# Patient Record
Sex: Female | Born: 1955 | Race: White | Hispanic: No | Smoking: Former smoker
Health system: Southern US, Community
[De-identification: ages and names within clinical notes are randomized; demographics above are authoritative.]

## PROBLEM LIST (undated history)

## (undated) DIAGNOSIS — J45909 Unspecified asthma, uncomplicated: Secondary | ICD-10-CM

## (undated) DIAGNOSIS — I471 Supraventricular tachycardia, unspecified: Secondary | ICD-10-CM

## (undated) DIAGNOSIS — R011 Cardiac murmur, unspecified: Secondary | ICD-10-CM

## (undated) DIAGNOSIS — J449 Chronic obstructive pulmonary disease, unspecified: Secondary | ICD-10-CM

## (undated) DIAGNOSIS — J441 Chronic obstructive pulmonary disease with (acute) exacerbation: Secondary | ICD-10-CM

## (undated) DIAGNOSIS — F329 Major depressive disorder, single episode, unspecified: Secondary | ICD-10-CM

## (undated) DIAGNOSIS — R51 Headache: Secondary | ICD-10-CM

## (undated) DIAGNOSIS — Z9289 Personal history of other medical treatment: Secondary | ICD-10-CM

## (undated) DIAGNOSIS — R748 Abnormal levels of other serum enzymes: Secondary | ICD-10-CM

## (undated) DIAGNOSIS — I10 Essential (primary) hypertension: Secondary | ICD-10-CM

## (undated) DIAGNOSIS — M961 Postlaminectomy syndrome, not elsewhere classified: Secondary | ICD-10-CM

## (undated) DIAGNOSIS — I2510 Atherosclerotic heart disease of native coronary artery without angina pectoris: Secondary | ICD-10-CM

## (undated) DIAGNOSIS — K529 Noninfective gastroenteritis and colitis, unspecified: Secondary | ICD-10-CM

## (undated) DIAGNOSIS — I209 Angina pectoris, unspecified: Secondary | ICD-10-CM

## (undated) DIAGNOSIS — M1990 Unspecified osteoarthritis, unspecified site: Secondary | ICD-10-CM

## (undated) DIAGNOSIS — R112 Nausea with vomiting, unspecified: Secondary | ICD-10-CM

## (undated) DIAGNOSIS — G8929 Other chronic pain: Secondary | ICD-10-CM

## (undated) DIAGNOSIS — R079 Chest pain, unspecified: Secondary | ICD-10-CM

## (undated) DIAGNOSIS — R351 Nocturia: Secondary | ICD-10-CM

## (undated) DIAGNOSIS — F32A Depression, unspecified: Secondary | ICD-10-CM

## (undated) DIAGNOSIS — M25561 Pain in right knee: Secondary | ICD-10-CM

## (undated) DIAGNOSIS — L7682 Other postprocedural complications of skin and subcutaneous tissue: Secondary | ICD-10-CM

## (undated) DIAGNOSIS — Z8719 Personal history of other diseases of the digestive system: Secondary | ICD-10-CM

## (undated) DIAGNOSIS — Z8669 Personal history of other diseases of the nervous system and sense organs: Secondary | ICD-10-CM

## (undated) DIAGNOSIS — E7800 Pure hypercholesterolemia, unspecified: Secondary | ICD-10-CM

## (undated) DIAGNOSIS — N179 Acute kidney failure, unspecified: Secondary | ICD-10-CM

## (undated) DIAGNOSIS — F4002 Agoraphobia without panic disorder: Secondary | ICD-10-CM

## (undated) DIAGNOSIS — K769 Liver disease, unspecified: Secondary | ICD-10-CM

## (undated) DIAGNOSIS — J42 Unspecified chronic bronchitis: Secondary | ICD-10-CM

## (undated) DIAGNOSIS — K219 Gastro-esophageal reflux disease without esophagitis: Secondary | ICD-10-CM

## (undated) DIAGNOSIS — M2392 Unspecified internal derangement of left knee: Secondary | ICD-10-CM

## (undated) DIAGNOSIS — R05 Cough: Secondary | ICD-10-CM

## (undated) DIAGNOSIS — M545 Low back pain, unspecified: Secondary | ICD-10-CM

## (undated) DIAGNOSIS — R0600 Dyspnea, unspecified: Secondary | ICD-10-CM

## (undated) DIAGNOSIS — M1712 Unilateral primary osteoarthritis, left knee: Secondary | ICD-10-CM

## (undated) DIAGNOSIS — I249 Acute ischemic heart disease, unspecified: Secondary | ICD-10-CM

## (undated) DIAGNOSIS — N3941 Urge incontinence: Secondary | ICD-10-CM

## (undated) DIAGNOSIS — J101 Influenza due to other identified influenza virus with other respiratory manifestations: Secondary | ICD-10-CM

## (undated) DIAGNOSIS — G4733 Obstructive sleep apnea (adult) (pediatric): Secondary | ICD-10-CM

## (undated) DIAGNOSIS — R058 Other specified cough: Secondary | ICD-10-CM

## (undated) DIAGNOSIS — D72829 Elevated white blood cell count, unspecified: Secondary | ICD-10-CM

## (undated) DIAGNOSIS — K589 Irritable bowel syndrome without diarrhea: Secondary | ICD-10-CM

## (undated) DIAGNOSIS — D509 Iron deficiency anemia, unspecified: Secondary | ICD-10-CM

## (undated) DIAGNOSIS — IMO0002 Reserved for concepts with insufficient information to code with codable children: Secondary | ICD-10-CM

## (undated) DIAGNOSIS — G4700 Insomnia, unspecified: Secondary | ICD-10-CM

## (undated) DIAGNOSIS — F419 Anxiety disorder, unspecified: Secondary | ICD-10-CM

## (undated) DIAGNOSIS — M25562 Pain in left knee: Secondary | ICD-10-CM

## (undated) DIAGNOSIS — G5602 Carpal tunnel syndrome, left upper limb: Secondary | ICD-10-CM

## (undated) DIAGNOSIS — M797 Fibromyalgia: Secondary | ICD-10-CM

## (undated) DIAGNOSIS — R200 Anesthesia of skin: Secondary | ICD-10-CM

## (undated) DIAGNOSIS — Z9889 Other specified postprocedural states: Secondary | ICD-10-CM

## (undated) DIAGNOSIS — E785 Hyperlipidemia, unspecified: Secondary | ICD-10-CM

## (undated) DIAGNOSIS — I200 Unstable angina: Secondary | ICD-10-CM

## (undated) DIAGNOSIS — Z720 Tobacco use: Secondary | ICD-10-CM

## (undated) DIAGNOSIS — R350 Frequency of micturition: Secondary | ICD-10-CM

## (undated) HISTORY — DX: Postlaminectomy syndrome, not elsewhere classified: M96.1

## (undated) HISTORY — PX: TUBAL LIGATION: SHX77

## (undated) HISTORY — DX: Supraventricular tachycardia, unspecified: I47.10

## (undated) HISTORY — DX: Acute ischemic heart disease, unspecified: I24.9

## (undated) HISTORY — DX: Hyperlipidemia, unspecified: E78.5

## (undated) HISTORY — DX: Low back pain, unspecified: M54.50

## (undated) HISTORY — PX: COLONOSCOPY: SHX174

## (undated) HISTORY — DX: Chest pain, unspecified: R07.9

## (undated) HISTORY — PX: JOINT REPLACEMENT: SHX530

## (undated) HISTORY — PX: UPPER GI ENDOSCOPY: SHX6162

## (undated) HISTORY — DX: Liver disease, unspecified: K76.9

## (undated) HISTORY — DX: Unilateral primary osteoarthritis, left knee: M17.12

## (undated) HISTORY — DX: Tobacco use: Z72.0

## (undated) HISTORY — DX: Supraventricular tachycardia: I47.1

## (undated) HISTORY — PX: EYE SURGERY: SHX253

## (undated) HISTORY — DX: Carpal tunnel syndrome, left upper limb: G56.02

## (undated) HISTORY — DX: Influenza due to other identified influenza virus with other respiratory manifestations: J10.1

## (undated) HISTORY — DX: Unstable angina: I20.0

## (undated) HISTORY — DX: Unspecified internal derangement of left knee: M23.92

## (undated) HISTORY — PX: CARPAL TUNNEL RELEASE: SHX101

## (undated) HISTORY — DX: Pain in right knee: M25.562

## (undated) HISTORY — PX: BACK SURGERY: SHX140

## (undated) HISTORY — PX: CATARACT EXTRACTION W/ INTRAOCULAR LENS  IMPLANT, BILATERAL: SHX1307

## (undated) HISTORY — DX: Chronic obstructive pulmonary disease with (acute) exacerbation: J44.1

## (undated) HISTORY — DX: Noninfective gastroenteritis and colitis, unspecified: K52.9

## (undated) HISTORY — DX: Pain in left knee: M25.561

## (undated) HISTORY — PX: WRIST SURGERY: SHX841

## (undated) HISTORY — DX: Acute kidney failure, unspecified: N17.9

---
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
## 1980-12-18 HISTORY — PX: CHOLECYSTECTOMY OPEN: SUR202

## 1980-12-18 HISTORY — PX: APPENDECTOMY: SHX54

## 1998-07-05 ENCOUNTER — Ambulatory Visit (HOSPITAL_COMMUNITY): Admission: RE | Admit: 1998-07-05 | Discharge: 1998-07-05 | Payer: Self-pay | Admitting: Gastroenterology

## 1998-08-30 ENCOUNTER — Ambulatory Visit (HOSPITAL_COMMUNITY): Admission: RE | Admit: 1998-08-30 | Discharge: 1998-08-30 | Payer: Self-pay | Admitting: Gastroenterology

## 1998-10-07 ENCOUNTER — Other Ambulatory Visit: Admission: RE | Admit: 1998-10-07 | Discharge: 1998-10-07 | Payer: Self-pay | Admitting: Family Medicine

## 1998-11-23 ENCOUNTER — Encounter: Payer: Self-pay | Admitting: Urology

## 1998-11-23 ENCOUNTER — Ambulatory Visit (HOSPITAL_COMMUNITY): Admission: RE | Admit: 1998-11-23 | Discharge: 1998-11-23 | Payer: Self-pay | Admitting: Urology

## 1999-02-11 ENCOUNTER — Ambulatory Visit (HOSPITAL_BASED_OUTPATIENT_CLINIC_OR_DEPARTMENT_OTHER): Admission: RE | Admit: 1999-02-11 | Discharge: 1999-02-11 | Payer: Self-pay | Admitting: Orthopedic Surgery

## 1999-10-20 ENCOUNTER — Encounter: Admission: RE | Admit: 1999-10-20 | Discharge: 1999-10-20 | Payer: Self-pay | Admitting: Family Medicine

## 1999-10-20 ENCOUNTER — Encounter: Payer: Self-pay | Admitting: Family Medicine

## 1999-10-27 ENCOUNTER — Other Ambulatory Visit: Admission: RE | Admit: 1999-10-27 | Discharge: 1999-10-27 | Payer: Self-pay | Admitting: Family Medicine

## 1999-12-19 HISTORY — PX: FINGER SURGERY: SHX640

## 2000-01-14 ENCOUNTER — Emergency Department (HOSPITAL_COMMUNITY): Admission: EM | Admit: 2000-01-14 | Discharge: 2000-01-14 | Payer: Self-pay | Admitting: Emergency Medicine

## 2000-05-09 ENCOUNTER — Ambulatory Visit (HOSPITAL_BASED_OUTPATIENT_CLINIC_OR_DEPARTMENT_OTHER): Admission: RE | Admit: 2000-05-09 | Discharge: 2000-05-10 | Payer: Self-pay | Admitting: Orthopedic Surgery

## 2000-12-18 HISTORY — PX: ANTERIOR CERVICAL DECOMP/DISCECTOMY FUSION: SHX1161

## 2001-03-22 ENCOUNTER — Ambulatory Visit (HOSPITAL_COMMUNITY): Admission: RE | Admit: 2001-03-22 | Discharge: 2001-03-22 | Payer: Self-pay | Admitting: Cardiovascular Disease

## 2001-03-22 ENCOUNTER — Encounter: Payer: Self-pay | Admitting: Cardiovascular Disease

## 2001-07-03 ENCOUNTER — Encounter: Admission: RE | Admit: 2001-07-03 | Discharge: 2001-07-03 | Payer: Self-pay | Admitting: Cardiovascular Disease

## 2001-07-03 ENCOUNTER — Encounter: Payer: Self-pay | Admitting: Cardiovascular Disease

## 2001-07-12 ENCOUNTER — Emergency Department (HOSPITAL_COMMUNITY): Admission: EM | Admit: 2001-07-12 | Discharge: 2001-07-12 | Payer: Self-pay | Admitting: Emergency Medicine

## 2001-07-12 ENCOUNTER — Encounter: Payer: Self-pay | Admitting: Emergency Medicine

## 2001-07-15 ENCOUNTER — Ambulatory Visit (HOSPITAL_COMMUNITY): Admission: RE | Admit: 2001-07-15 | Discharge: 2001-07-15 | Payer: Self-pay | Admitting: Cardiovascular Disease

## 2001-07-15 ENCOUNTER — Encounter: Payer: Self-pay | Admitting: Cardiovascular Disease

## 2001-09-13 ENCOUNTER — Encounter: Payer: Self-pay | Admitting: Neurosurgery

## 2001-09-17 ENCOUNTER — Inpatient Hospital Stay (HOSPITAL_COMMUNITY): Admission: RE | Admit: 2001-09-17 | Discharge: 2001-09-20 | Payer: Self-pay | Admitting: Neurosurgery

## 2001-09-17 ENCOUNTER — Encounter: Payer: Self-pay | Admitting: Neurosurgery

## 2001-10-02 ENCOUNTER — Encounter: Payer: Self-pay | Admitting: Neurosurgery

## 2001-10-02 ENCOUNTER — Encounter: Admission: RE | Admit: 2001-10-02 | Discharge: 2001-10-02 | Payer: Self-pay | Admitting: Neurosurgery

## 2001-10-17 ENCOUNTER — Other Ambulatory Visit: Admission: RE | Admit: 2001-10-17 | Discharge: 2001-10-17 | Payer: Self-pay | Admitting: Family Medicine

## 2001-12-17 ENCOUNTER — Encounter: Admission: RE | Admit: 2001-12-17 | Discharge: 2002-03-17 | Payer: Self-pay | Admitting: Neurosurgery

## 2001-12-18 HISTORY — PX: CARDIAC CATHETERIZATION: SHX172

## 2002-08-01 ENCOUNTER — Ambulatory Visit (HOSPITAL_COMMUNITY): Admission: RE | Admit: 2002-08-01 | Discharge: 2002-08-01 | Payer: Self-pay | Admitting: Cardiovascular Disease

## 2002-11-14 ENCOUNTER — Other Ambulatory Visit: Admission: RE | Admit: 2002-11-14 | Discharge: 2002-11-14 | Payer: Self-pay | Admitting: Obstetrics & Gynecology

## 2002-12-18 DIAGNOSIS — Z8711 Personal history of peptic ulcer disease: Secondary | ICD-10-CM

## 2002-12-18 DIAGNOSIS — D509 Iron deficiency anemia, unspecified: Secondary | ICD-10-CM

## 2002-12-18 HISTORY — DX: Personal history of peptic ulcer disease: Z87.11

## 2002-12-18 HISTORY — DX: Iron deficiency anemia, unspecified: D50.9

## 2003-07-29 ENCOUNTER — Ambulatory Visit (HOSPITAL_COMMUNITY): Admission: RE | Admit: 2003-07-29 | Discharge: 2003-07-29 | Payer: Self-pay | Admitting: Cardiovascular Disease

## 2003-08-04 ENCOUNTER — Emergency Department (HOSPITAL_COMMUNITY): Admission: EM | Admit: 2003-08-04 | Discharge: 2003-08-04 | Payer: Self-pay | Admitting: Emergency Medicine

## 2003-11-16 ENCOUNTER — Other Ambulatory Visit: Admission: RE | Admit: 2003-11-16 | Discharge: 2003-11-16 | Payer: Self-pay | Admitting: Obstetrics & Gynecology

## 2003-11-17 ENCOUNTER — Encounter: Admission: RE | Admit: 2003-11-17 | Discharge: 2003-11-17 | Payer: Self-pay | Admitting: Gastroenterology

## 2003-12-01 ENCOUNTER — Inpatient Hospital Stay (HOSPITAL_COMMUNITY): Admission: AD | Admit: 2003-12-01 | Discharge: 2003-12-11 | Payer: Self-pay | Admitting: Cardiovascular Disease

## 2003-12-02 ENCOUNTER — Encounter (INDEPENDENT_AMBULATORY_CARE_PROVIDER_SITE_OTHER): Payer: Self-pay | Admitting: Specialist

## 2003-12-07 ENCOUNTER — Encounter (INDEPENDENT_AMBULATORY_CARE_PROVIDER_SITE_OTHER): Payer: Self-pay | Admitting: *Deleted

## 2003-12-19 HISTORY — PX: HYSTEROSCOPY WITH D & C: SHX1775

## 2004-01-11 ENCOUNTER — Ambulatory Visit (HOSPITAL_COMMUNITY): Admission: RE | Admit: 2004-01-11 | Discharge: 2004-01-11 | Payer: Self-pay | Admitting: Gastroenterology

## 2004-04-14 ENCOUNTER — Encounter: Admission: RE | Admit: 2004-04-14 | Discharge: 2004-04-14 | Payer: Self-pay | Admitting: Cardiovascular Disease

## 2004-04-26 ENCOUNTER — Emergency Department (HOSPITAL_COMMUNITY): Admission: EM | Admit: 2004-04-26 | Discharge: 2004-04-27 | Payer: Self-pay | Admitting: *Deleted

## 2004-06-07 ENCOUNTER — Encounter: Admission: RE | Admit: 2004-06-07 | Discharge: 2004-06-07 | Payer: Self-pay | Admitting: Gastroenterology

## 2004-09-29 ENCOUNTER — Ambulatory Visit (HOSPITAL_COMMUNITY): Admission: RE | Admit: 2004-09-29 | Discharge: 2004-09-29 | Payer: Self-pay | Admitting: Obstetrics & Gynecology

## 2004-09-29 ENCOUNTER — Encounter (INDEPENDENT_AMBULATORY_CARE_PROVIDER_SITE_OTHER): Payer: Self-pay | Admitting: *Deleted

## 2004-10-05 ENCOUNTER — Encounter: Admission: RE | Admit: 2004-10-05 | Discharge: 2004-10-05 | Payer: Self-pay | Admitting: Cardiovascular Disease

## 2004-12-18 HISTORY — PX: OTHER SURGICAL HISTORY: SHX169

## 2005-02-13 ENCOUNTER — Encounter: Admission: RE | Admit: 2005-02-13 | Discharge: 2005-02-13 | Payer: Self-pay | Admitting: Neurosurgery

## 2005-05-27 ENCOUNTER — Emergency Department (HOSPITAL_COMMUNITY): Admission: EM | Admit: 2005-05-27 | Discharge: 2005-05-27 | Payer: Self-pay | Admitting: Emergency Medicine

## 2005-08-10 ENCOUNTER — Ambulatory Visit (HOSPITAL_COMMUNITY): Admission: RE | Admit: 2005-08-10 | Discharge: 2005-08-10 | Payer: Self-pay | Admitting: Urology

## 2005-08-10 ENCOUNTER — Encounter (INDEPENDENT_AMBULATORY_CARE_PROVIDER_SITE_OTHER): Payer: Self-pay | Admitting: Specialist

## 2005-08-10 ENCOUNTER — Ambulatory Visit (HOSPITAL_BASED_OUTPATIENT_CLINIC_OR_DEPARTMENT_OTHER): Admission: RE | Admit: 2005-08-10 | Discharge: 2005-08-10 | Payer: Self-pay | Admitting: Urology

## 2005-12-15 ENCOUNTER — Emergency Department (HOSPITAL_COMMUNITY): Admission: EM | Admit: 2005-12-15 | Discharge: 2005-12-15 | Payer: Self-pay | Admitting: Emergency Medicine

## 2005-12-18 HISTORY — PX: LUMBAR LAMINECTOMY: SHX95

## 2006-01-12 ENCOUNTER — Ambulatory Visit (HOSPITAL_COMMUNITY): Admission: RE | Admit: 2006-01-12 | Discharge: 2006-01-12 | Payer: Self-pay | Admitting: Cardiovascular Disease

## 2006-10-19 ENCOUNTER — Inpatient Hospital Stay (HOSPITAL_COMMUNITY): Admission: RE | Admit: 2006-10-19 | Discharge: 2006-10-21 | Payer: Self-pay | Admitting: Specialist

## 2006-12-18 HISTORY — PX: ANTERIOR CERVICAL DECOMP/DISCECTOMY FUSION: SHX1161

## 2007-01-28 ENCOUNTER — Ambulatory Visit (HOSPITAL_COMMUNITY): Admission: RE | Admit: 2007-01-28 | Discharge: 2007-01-28 | Payer: Self-pay | Admitting: Cardiovascular Disease

## 2007-07-15 ENCOUNTER — Encounter: Admission: RE | Admit: 2007-07-15 | Discharge: 2007-07-15 | Payer: Self-pay | Admitting: Neurosurgery

## 2007-07-26 ENCOUNTER — Inpatient Hospital Stay (HOSPITAL_COMMUNITY): Admission: RE | Admit: 2007-07-26 | Discharge: 2007-07-31 | Payer: Self-pay | Admitting: Neurosurgery

## 2007-12-23 ENCOUNTER — Emergency Department (HOSPITAL_COMMUNITY): Admission: EM | Admit: 2007-12-23 | Discharge: 2007-12-23 | Payer: Self-pay | Admitting: Emergency Medicine

## 2008-07-15 ENCOUNTER — Encounter: Admission: RE | Admit: 2008-07-15 | Discharge: 2008-07-15 | Payer: Self-pay | Admitting: Cardiovascular Disease

## 2008-07-23 ENCOUNTER — Ambulatory Visit (HOSPITAL_COMMUNITY): Admission: RE | Admit: 2008-07-23 | Discharge: 2008-07-23 | Payer: Self-pay | Admitting: Cardiovascular Disease

## 2008-09-17 ENCOUNTER — Emergency Department (HOSPITAL_COMMUNITY): Admission: EM | Admit: 2008-09-17 | Discharge: 2008-09-18 | Payer: Self-pay | Admitting: Emergency Medicine

## 2010-02-01 ENCOUNTER — Encounter: Admission: RE | Admit: 2010-02-01 | Discharge: 2010-05-02 | Payer: Self-pay | Admitting: Family Medicine

## 2010-04-20 ENCOUNTER — Encounter: Admission: RE | Admit: 2010-04-20 | Discharge: 2010-04-20 | Payer: Self-pay | Admitting: Family Medicine

## 2011-03-30 ENCOUNTER — Emergency Department (HOSPITAL_COMMUNITY): Payer: Medicare Other

## 2011-03-30 ENCOUNTER — Emergency Department (HOSPITAL_COMMUNITY)
Admission: EM | Admit: 2011-03-30 | Discharge: 2011-03-31 | Disposition: A | Discharge status: Home / Self Care | Payer: Medicare Other | Attending: Emergency Medicine | Admitting: Emergency Medicine

## 2011-03-30 DIAGNOSIS — S52539A Colles' fracture of unspecified radius, initial encounter for closed fracture: Secondary | ICD-10-CM | POA: Insufficient documentation

## 2011-03-30 DIAGNOSIS — I2510 Atherosclerotic heart disease of native coronary artery without angina pectoris: Secondary | ICD-10-CM | POA: Insufficient documentation

## 2011-03-30 DIAGNOSIS — M25539 Pain in unspecified wrist: Secondary | ICD-10-CM | POA: Insufficient documentation

## 2011-03-30 DIAGNOSIS — M542 Cervicalgia: Secondary | ICD-10-CM | POA: Insufficient documentation

## 2011-03-30 DIAGNOSIS — W1789XA Other fall from one level to another, initial encounter: Secondary | ICD-10-CM | POA: Insufficient documentation

## 2011-03-30 DIAGNOSIS — Y9289 Other specified places as the place of occurrence of the external cause: Secondary | ICD-10-CM | POA: Insufficient documentation

## 2011-03-30 DIAGNOSIS — I10 Essential (primary) hypertension: Secondary | ICD-10-CM | POA: Insufficient documentation

## 2011-03-30 DIAGNOSIS — S0990XA Unspecified injury of head, initial encounter: Secondary | ICD-10-CM | POA: Insufficient documentation

## 2011-03-30 DIAGNOSIS — J45909 Unspecified asthma, uncomplicated: Secondary | ICD-10-CM | POA: Insufficient documentation

## 2011-03-30 DIAGNOSIS — E785 Hyperlipidemia, unspecified: Secondary | ICD-10-CM | POA: Insufficient documentation

## 2011-05-02 NOTE — Discharge Summary (Signed)
[name], [name]                 ACCOUNT NO.:  [id number]   MEDICAL RECORD NO.:  [id number]          PATIENT TYPE:  INP   LOCATION:  3038                         FACILITY:  [facility]   PHYSICIAN:  Hilda Lias, M.D.   DATE OF BIRTH:  Nov 05, 1956   DATE OF ADMISSION:  07/26/2007  DATE OF DISCHARGE:  07/31/2007                               DISCHARGE SUMMARY   ADMISSION DIAGNOSES:  1. C4-5, C6-7 pseudoarthrosis.  2. Herniated disc C7-T1.   FINAL DIAGNOSES:  1. C4-5, C6-7 pseudoarthrosis.  2. Herniated disc C7-T1.   CLINICAL HISTORY:  The patient was admitted because of problem with neck  pain with new onset of radiation of pain to the right upper extremity.  Previously, this lady had a T11 fusion.  X-ray shows she has a false  joint at the level of 4-5 and 6-7 with a new herniated disc at the level  of C7-T1 with a normal fusion 5-6.  Because of the findings, she was  admitted to the hospital.   LABORATORY:  Normal.   COURSE IN THE HOSPITAL:  The patient was taken to surgery and a redo of  fusion at the level of 4-5 and 6-7 with a new fusion at the level of C7-  T1 was done.  We used bone from the hip.  Today, she is doing much  better.  The x-ray shows good alignment.  She has minimal pain between  the shoulder blade and no weakness.  Swallowing is normal.  She is going  to be discharged today to follow by me in my office.   CONDITION ON DISCHARGE:  Improvement.   MEDICATIONS:  Percocet, Diazepam.   DIET:  Regular.   ACTIVITY:  Not to drive for at least a week.   Follow up four weeks from now or as needed before.           ______________________________  Hilda Lias, M.D.     EB/MEDQ  D:  07/31/2007  T:  07/31/2007  Job:  [id number]

## 2011-05-02 NOTE — Op Note (Signed)
[name], [name]                 ACCOUNT NO.:  [id number]   MEDICAL RECORD NO.:  [id number]          PATIENT TYPE:  INP   LOCATION:  3038                         FACILITY:  [facility]   PHYSICIAN:  Hilda Lias, M.D.   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   PROCEDURES:  1. Exploration of the fusion.  2. Removal of the plate from C4 to C7.  3. Redo the cervical arthrosis 4-5 and 6-7.  4. Decompression of the C7-T1.  5. Fusion with autograft C4-5, C6-7, and C7-T1.  6. Two plates, one from C4 to C5 and the other one from C6 to T1.  7. Autograft from the left hip bone through a separate incision.  8. Microscope.   SURGEON:  Hilda Lias, M.D.   ASSISTANT:  Coletta Memos, M.D.   CLINICAL HISTORY:  [name] is a lady who is being admitted because of  cervical arthrosis at the level of 4-5 and 6-7 with a new herniated disk  at the level of C7-T1 to the right.  The risks were explained to her  during my physical examination and on several occasions she came to my  office.   PROCEDURE IN DETAIL:  The patient was taken to the OR and after  intubation, the left hip area and the left neck was prepped with  DuraPrep.  Drapes were applied.  A dermal incision from the previous one  was made in the skin through the subcutaneous tissue down to the  platysma.  The patient has quite a bit of adhesions where lysis was  achieved.  Immediately with the finger, we were able to palpate the  superior and inferior screws, which were in the soft tissue.  With  microdissection, we were able to remove both of the screws.  Reduction  of the trachea and the esophagus was done and the rest of the  six screws  from the plate were removed.  The patient with overhanging of the C4  vertebral body, which was drilled away.  After that, we removed the  plate.  We did exploration at the level of 4-5 and after, we took an x-  ray.  Indeed, the patient had fibrosis and removal of the fibrosis was  achieved with a foraminotomy.  The same procedure was done at the level  of C6 and C7 when we found essentially the same fibrosis with no  evidence of any bone whatsoever.  The area between 5-6 was completely  solid.  From then on, an anterior osteophyte at the level of C7-T1 was  removed and we entered the disk space where after we opened the  posterior ligament, we found four light fragments of herniated disk  going to the right side compromising the right C8 nerve root.  Decompression was achieved as well as decompression of the left C8 nerve  root.  Then, through a separate incision, we made an incision in the  left hip area through the skin and subcutaneous tissue down to the hip  area.  The fat here and muscle was there disattached from the ileal  crest.  Using the osteotome, we were able to remove three pieces of bone  graft of 10 mm.  The bone graft was tailored to the three spaces and  were inspected at the level of 4-5, 6-7 and C7-T1.  From then on, a  small plate from C4 to C5 using four screws was done, this was a  [place].  In the lower area, we did a plate using six screws from C6  down to T1.  Lateral cervical spine showed good position of the graft  from the plate at the level of 4-5 and the upper part of C6 was normal.  From then on, the area was irrigated.  We went ahead and introduced an  esophageal tube.  Note anesthetist introduced a mix of methylene blue  and saline to see if there is any evidence of any leak or opening in the  esophagus.  It was negative.  From then on, the Jackson-Pratt drain was  left in the precervical area and the wound was closed with Vicryl and   Steri-Strip.  In the hip area after we achieved good hemostasis, this  separate incision was closed with 0 Vicryl, 2-0 Vicryl and a Steri-  Strip.  The patient did well.           ______________________________  Hilda Lias, M.D.     EB/MEDQ  D:  07/26/2007  T:  07/27/2007  Job:  [id number]

## 2011-05-02 NOTE — H&P (Signed)
NAMEOMNI, DUNSWORTH                 ACCOUNT NO.:  [id number]   MEDICAL RECORD NO.:  [id number]          PATIENT TYPE:  INP   LOCATION:  3038                         FACILITY:  [facility]   PHYSICIAN:  Hilda Lias, M.D.   DATE OF BIRTH:  1956-04-11   DATE OF ADMISSION:  07/26/2007  DATE OF DISCHARGE:                              HISTORY & PHYSICAL   Ms. Clippard is a lady who in the past underwent fusion of the level 4-5,  5-6, and 6-7.  She was followed by me, and she developed pseudoarthrosis  of the left 4-5 and 6-7.  Nevertheless, she continued to complain of  some neck pain but nothing really special.  One of the x-rays showed  that one of the screws was loose.  I talked to her about surgery but she  declined.  She came to my office several weeks ago complaining of  worsening pain now into the right upper extremity associated with  weakness of the triceps.  I look at the x-ray.  We did a myelogram, and  it showed not only two loose screws and pseudoarthrosis but a large  herniated disk with spondylosis at the level of C7-T1 to the right side.  Because of that, we decided to proceed with surgery.  The patient fully  agreed, including knowing that she is heavy smoker and we are going to  be using her own hip bone.   PAST MEDICAL HISTORY:  1. Tonsillectomy.  2. Cholecystectomy.  3. Tubal ligation.   SOCIAL HISTORY:  She does smoke tobacco.  She drinks socially.   FAMILY HISTORY:  Unremarkable.   REVIEW OF SYSTEMS:  Positive for bronchitis, high blood pressure, sinus  headache, neck pain, and anxiety.   PHYSICAL EXAMINATION:  HEENT:  Normal.  NECK:  There is a scar anteriorly.  She has decreased flexibility.  LUNGS:  There is some mild rhonchi bilaterally.  CARDIOVASCULAR:  Normal.  EXTREMITIES:  Normal.  NEUROLOGIC:  Mental status normal.  Cranial nerves normal.  Strength:  She has weakness of the right triceps.  She also has changes of the  pectoralis major.  She has absent  right triceps reflex.   The x-ray and the myelogram showed that she has a herniated disk at the  level of  C7-T1 to the right and pseudoarthrosis at the level of 4-5and  6-7.   CLINICAL IMPRESSION:  Pseudoarthrosis of 4-5 and 6-7 with a new  herniated disk at the level of C7-T1.   PROCEDURE:  The patient is going to be admitted for surgery.  We are  going to remove the old plate, redo 4-5 and 6-7 and proceed with  decompression and fusion at the level of C7-T1.  We are going to be use  her on hip bone, and she knows about the risks of infection, CSF leak,  damage to the vocal cord, damage to the esophagus, need for further  surgery.           ______________________________  Hilda Lias, M.D.     EB/MEDQ  D:  07/26/2007  T:  07/28/2007  Job:  [id number]

## 2011-05-05 NOTE — Op Note (Signed)
[place]  Patient:    [name], [name] Visit Number: [id number] MRN: [id number]          Service Type: SUR Location: 3000 [id number] [id number] Attending Physician:  Danella Penton Dictated by:   Tanya Nones. Jeral Fruit, M.D. Proc. 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  POSTOPERATIVE DIAGNOSIS:  Cervical spondylosis at C4-5, C5-6, C6-7, with a chronic cervical radiculopathy.  PROCEDURE:  Anterior C4-5, C5-6, C6-7 diskectomy, decompression of the spinal cord, bilateral foraminotomy, microscope.  SURGEON:  Tanya Nones. Jeral Fruit, M.D.  ASSISTANT:  Hewitt Shorts, M.D.  CLINICAL HISTORY:  The patient was admitted because of neck pain with radiation to both upper extremities.  The x-ray showed spondylosis at three levels.  Surgery was advised, and the risks were explained in the history and physical.  DESCRIPTION OF PROCEDURE:  The patient was taken to the OR, and the left side of the neck was prepped with Betadine.  No traction was used.  An incision on the left side through the skin and platysma was carried out until we found the cervical spine.  We took an x-ray, which showed that indeed we were at the level of 5-6.  From then on, we went up to 4-5 and we did total diskectomy with decompression of the spinal cord and the foramen.  At the level of 5-6, we found worsening of the foraminal stenosis up to the point that in the left side, the vertebral artery was almost in the one-third lateral aspect of the disk space.  Decompression of both nerve roots was achieved and preservation of the artery was done.  Then at the level of C6-7, the same procedure was done.  Then we drilled the end plates at those three levels, and we introduced a bone graft of 7 mm in all three levels.  This was followed with a  plate using Tang screws.  Lateral C-spine showed good position of the grafts and the plate.  The patient, since he had been taking aspirin for quite a long time, has quite a bit of bleeding and because of this, to prevent any postoperative hematoma, we left a Jackson-Pratt drain.  Having good hemostasis and the area was irrigated, investigation of the esophagus, carotid, and trachea was normal.  From then on the wound was closed with Vicryl and Steri-Strip. Dictated by:   Tanya Nones. Jeral Fruit, M.D. Attending Physician:  Danella Penton DD:  09/17/01 TD:  09/17/01 Job: [phone]

## 2011-05-05 NOTE — Cardiovascular Report (Signed)
   NAME:  Brenda, Meadows                           ACCOUNT NO.:  [id number]   MEDICAL RECORD NO.:  [id number]                   PATIENT TYPE:  OIB   LOCATION:  2899                                 FACILITY:  [facility]   PHYSICIAN:  Ricki Rodriguez, M.D.               DATE OF BIRTH:  March 02, 1956   DATE OF PROCEDURE:  08/01/2002  DATE OF DISCHARGE:                              CARDIAC CATHETERIZATION   PROCEDURE:  Left heart catheterization, selective coronary angiography and  left ventricular function study.   INDICATION:  This 55 year old white female with multiple cardiac risk  factors had recurrent chest pain with abnormal stress test.   APPROACH:  Right femoral artery using 6 French diagnostic catheters.   COMPLICATIONS:  None.   Perclose suture applied.  However, additional 10 minute of local pressure  was needed for hemostasis. The patient required Romazicon to reverse the  sedation.   CORONARY ANATOMY:  Left main coronary artery was very short.   Left anterior descending coronary artery had significant calcification and a  proximal diffuse narrowing of 20-30%, mid vessel eccentric 30-40% narrowing.  Diagonal #1 vessel was a very small vessel and diagonal #2 also was small  vessel.   Left circumflex coronary artery had luminal irregularities.  Obtuse marginal  branch #1 had ostial 20% lesion. Obtuse marginal branch #2 had eccentric 80%  stenosis. However, the vessel was less than 1.5 mm in diameter.   Right coronary artery had a mid vessel 30% stenosis and the posterior  descending coronary artery was unremarkable.   LEFT VENTRICULOGRAM:  The left ventriculogram showed basilar inferior and  apical inferior wall hypokinesia with ejection fraction of 50-55%.   IMPRESSION:  1. Mild multivessel coronary artery disease.  2. Severe obtuse marginal branch #2 disease.  3. Preserved left ventricular systolic function.   RECOMMENDATIONS:  This patient will be treated medically with  aspirin,  Plavix, nitroglycerin, and lifestyle modifications.                                               Ricki Rodriguez, M.D.    ASK/MEDQ  D:  08/01/2002  T:  08/04/2002  Job:  [phone] [phone]

## 2011-05-05 NOTE — Discharge Summary (Signed)
. [facility]  Patient:    [name], [name] Visit Number: [id number] MRN: [id number]          Service Type: SUR Location: 3000 [id number] [id number] Attending Physician:  Danella Penton Dictated by:   Tanya Nones. Jeral Fruit, M.D. Admit Date:  09/17/2001 Discharge Date: 09/20/2001                             Discharge Summary  ADMITTING DIAGNOSIS:  Cervical vertebrae-4/5, cervical vertebrae-5/6, cervical vertebrae-6/7 spondylosis with radiculopathy.  FINAL DIAGNOSIS:  Cervical vertebrae-4/5, cervical vertebrae-5/6, cervical vertebrae-6/7 spondylosis with radiculopathy.  HISTORY OF PRESENT ILLNESS:  The patient was admitted because of neck pain related to the upper extremities.  X-rays showed spondylosis at three levels. Surgery was advised.  LABORATORY DATA:  Normal.  HOSPITAL COURSE:  The patient was taken to surgery.  Had a three level anterior cervical discectomy followed by fusion was done.  The patient doing much better today.  He is able to swallow.  He is ready to go home.  CONDITION ON DISCHARGE:  Improved.  DISCHARGE MEDICATIONS:  Percocet and diazepam.  DIET:  Regular.  ACTIVITY:  Not to lift for at least 10 days.  FOLLOW-UP:  To be seen by me in three weeks. Dictated by:   Tanya Nones. Jeral Fruit, M.D. Attending Physician:  Danella Penton DD:  09/20/01 TD:  09/20/01 Job: [id number]

## 2011-05-05 NOTE — Op Note (Signed)
[place]. [facility]  Patient:    [name], [name]                        MRN: [id number] Proc. Date: 05/09/00 Adm. Date:  [id number] Disc. Date: [id number] Attending:  Marlowe [place] CC:         Artist Pais Mina Marble, M.D. (2)                           Operative Report  PREOPERATIVE DIAGNOSIS:   Right thumb carpometacarpal arthritis.  POSTOPERATIVE DIAGNOSIS:  Right thumb CMC arthritis.  PROCEDURE:  Right thumb carpometacarpal suspension plasty with extensor indicis  proprius free tendon graft as well as EPL tendon transfer from the wrist .  SURGEON:  Artist Pais. Mina Marble, M.D.  ASSISTANT:  Nicki Reaper, M.D.  ANESTHESIA:  Axillary block with general supplemental  TOURNIQUET TIME:  1 hour, 20 minutes.  COMPLICATIONS/DRAINS:  None.  DESCRIPTION OF PROCEDURE:  The patient was taken to the operating room after the induction of adequate axillary block analgesia and general anesthesia.  The right upper extremity was prepped and draped in the usual sterile fashion.  Esmarch was used to exsanguinate the limb, and tourniquet was inflated to 250 mmHg. At this  point in time, a J shaped incision was made over the metacarpal base of the thumb and the J part was extended to the volar surface over the FCR tendon insertion.  Incision was taken down to the skin and subcutaneous tissues, with care to carefully identify retracted cutaneous nerves and veins. Once this was done, the joint capsule overlying the [facility] joint was split longitudinally, and the [facility] joint was identified.  There was significant hypertrophic synovium, hypertrophic capsular tissue involved. The trapezium was then excised using a combination of rongeurs, curets and then a synovectomy was performed of the [facility] joint.  Next, using a bone awl, a trans-osseous tunnel was made at the base of the thumb metacarpal and enlarged with subchondral drilling by hand, followed by formation of  a trans-osseous tunnel at the base of the next metacarpal.  Once the hand was fully supinated, a small incision was made over the palmar aspect of the hand and dissection was carried down to the volar wrist crease.  There was no palmaris longus tendon evident. The hand was then further pronated, and the EIP tendon was harvested in the usual fashion through 3 incisions.  The EIP was then placed in a sterile saline-soaked gauze.  At this point in time, ______ were driven through the trans-osseous tunnel on the index finger, and two Tycron sutures were placed with a loop on the volar side remaining. The free EIP tendon graft was then pulled up nto one of the two Ethibond suture loops and tightly secured over the dorsal aspect of the index metacarpal through a separate stab incision. The other end of the EIP  tendon was then looped through the metacarpal x 2 and sewn to itself, thus creating one suspension.  At this point, attention was paid to the dorsal radial aspect of the wrist distally, where a small incision was made over the dorsal compartment and a slip of the APL tendon was harvested in the usual fashion.  The APL tendon slip was then transferred into the [facility] joint. One end was pulled into the base of the next metacarpal through the trans-osseous tunnel and tied securely over the dorsal aspect of  the index finger, and end was looped through the metacarpal, through he trans-osseous tunnel, securely over the dorsal aspect of the index finger and end was looped through the previously placed EIP tendon free graft. These were all sutured securely with the thumb in full adduction against the palm, and longitudinal traction; thus creating the suspension.  These were all sutured using 3-0 Ethibond and figure-of-eight sutures.  The wound was irrigated thoroughly. he capsule was closed with 3-0 Ethibond and skin was closed with running 3-0 Prolene subcuticular stitches.  Steri-Strips, 4 x 4s, Fluffs and a radial gauze splint was applied. The patient tolerated the procedure well and went to the recovery room in stable fashion. DD:  05/09/00 TD:  05/13/00 Job: [id number]

## 2011-05-05 NOTE — Discharge Summary (Signed)
NAME:  [name], [name]                           ACCOUNT NO.:  [id number]   MEDICAL RECORD NO.:  [id number]                   PATIENT TYPE:  INP   LOCATION:  5708                                 FACILITY:  [facility]   PHYSICIAN:  Mohan N. Sharyn Lull, M.D.              DATE OF BIRTH:  [birth year]   DATE OF ADMISSION:  12/01/2003  DATE OF DISCHARGE:  12/11/2003                                 DISCHARGE SUMMARY   ADMITTING DIAGNOSES:  1. Weakness.  2. Dizziness.  3. Abdominal pain.  4. Peptic ulcer disease.  5. Hypochromic microcytic anemia; rule out gastrointestinal bleeding.  6. Hypertension.  7. Hyperlipidemia.  8. Dehydration.   FINAL DIAGNOSES:  1. Probable Crohn's disease.  2. Status post upper gastrointestinal bleeding probably secondary to pyloric     channel ulcer, resolving.  3. Resolving antibiotic-associated enterocolitis.  4. Anemia.  5. Chronic obstructive pulmonary disease.  6. Hypertension.  7. Depression.  8. Hyperlipidemia.   DISCHARGE HOME MEDICATIONS:  1. Norvasc 5 mg 1 tablet daily.  2. Clonidine 0.1 mg 1 tablet daily.  3. Singulair 10 mg 1 tablet daily.  4. Wellbutrin SR 150 mg 1 tablet daily.  5. Flagyl 250 mg 1 tab 3 times daily for 10 days.  6. Pentasa 250 mg 4 capsules 3 times daily.  7. Nexium 40 mg 1 capsule daily 1/2 hour before breakfast.  8. Ambien 10 mg 1 tablet daily at night as needed.   ACTIVITY:  As tolerated.   DIET:  Low salt, low cholesterol.   FOLLOW UP:  CBC in one week. Follow up with Dr. Algie Coffer in one week and Dr.  [name] in two weeks.   CONDITION ON DISCHARGE:  Stable.   BRIEF HISTORY:  Ms. Karras is a 55 year old white female who was admitted by  Dr. Algie Coffer on December 14 with complaint of weakness, dizziness, muscle  aches, and abdominal pain.  She states that she has lost approximately 10  pounds in the last two months.   PAST MEDICAL HISTORY:  1. History of hypertension.  2. Anemia.  3. History of smoking one to two packs  per day for 28 years.  No history of     alcohol or drug abuse.  4. History of hypercholesterolemia.  5. History of mild obesity.  6. Positive family history of coronary artery disease.   PAST SURGICAL HISTORY:  1. Tonsillectomy in 1955.  2. Tubal ligation in 1983.  3. Cholecystectomy in 1982.  4. Appendectomy in 1982.  5. Right thyroid surgery in 2000.  6. Hiatus hernia.  7. Bladder suspension surgery in 1998.  8. Catheterization in 2003.   MEDICATIONS AT HOME:  1. Valium 5 mg p.o. daily.  2. Hydrochlorothiazide 25 mg p.o. daily.  3. KCl 10 mEq t.i.d.  4. Baby aspirin 81 mg p.o. daily.  5. Norvasc 5 mg p.o. daily.  6. Lopid 600 mg p.o. b.i.d.  7. Clonidine 0.1 mg p.o. daily.  8. Nexium 40 mg p.o. b.i.d.  9. Singulair 10 mg p.o. daily.  10.      Combivent inhaler 2 puffs b.i.d.  11.      Wellbutrin SR 150 b.i.d.   ALLERGIES:  None.   SOCIAL HISTORY:  She is married for 23 years.  One daughter.  She works at  [employer].   FAMILY HISTORY:  Father at 60 had MI.  Mother is alive at 31.  She is  hypertensive.  One brother has hypertension.  Two sisters have hypertension.   PHYSICAL EXAMINATION:  VITAL SIGNS:  Blood pressure 110/70, pulse  84 and  regular.  HEENT:  Normocephalic and atraumatic.  Eyes: Conjunctivae pale.  NECK: No JVD, no bruits.  LUNGS: Clear to auscultation.  CARDIAC:  S1 and S2 normal.  ABDOMEN:  Soft, mild epigastric tenderness.  EXTREMITIES:  No clubbing, cyanosis, or edema.  NEUROLOGIC:  Roughly intact.   LABORATORY DATA:  EKG showed normal sinus rhythm with no ischemic changes.  Chest x-ray shows below lung volumes, no active cardiopulmonary process  demonstrated.   Small-bowel follow-through showed irregularly thickened fold involving  distal ileum.  The terminal  ileum itself has normal appearance; however,  the abnormal segment is estimated to be approximately 20 cm in length.  Findings consistent with Crohn's disease.  Other infiltrative processes  such  as lymphoma could have a similar appearance.   KUB of the abdomen showed air/fluid levels with small amount of air and  fluid within the colon.  This may represent partial small-bowel obstruction.  No free air was noted.   CT of the abdomen showed dilated small-bowel loops, and also there was 1.5  cm hemangioma of right upper lobe of the liver.   Stool for C. difficile toxin was positive.  Cholesterol was 103,  triglycerides 173, LDL 56, HDL 12.  Iron 28, B12 487, RBC folate 607,  ferritin 153.  Sodium 137, potassium 4.2, chloride 101, bicarb 26, blood  sugar 135, BUN 19, creatinine 1.4.  On December 19, fasting blood sugar was  89, BUN 6, creatinine 0.9.  Liver enzymes: AST 16, ALT 13, alkaline  phosphatase slightly elevated at 119, total bilirubin 0.4. Admission  hemoglobin was 7.2, hematocrit 22, white count 16.2, MCV 72.6 which was low.  Last hemoglobin was 11.8, hematocrit 35.8, white count 17.7, platelet count  689.  He last BMP yesterday was potassium 3.9, glucose 100, BUN 11,  creatinine 1.1.   HOSPITAL COURSE:  The patient was admitted to regular floor, was started on  IV fluids.  GI consultation was called.  Also, patient received 2 units of  packed red blood cells.  The patient subsequently underwent upper endoscopy  which showed small pyloric channel ulcers with no active bleeding.  The  patient was kept n.p.o. for possible small-bowel obstruction and was put on  NG suction with improvement in her symptoms.  The patient then subsequently  underwent colonoscopy on December 20 which showed inflammatory stenosis of  ileocecal valve which was biopsied.  Terminal ileum could not be entered  through the scope.  Plan was to undergo small-bowel follow-through.  Subsequently, the patient underwent small-bowel follow-through as per  radiology report.  The patient has been eating well and tolerating p.o. intake.  The patient  did not have any further episodes of diarrhea or  abdominal pain.  The  patient is prepared to go home.  The patient has been cleared by GI and  will  be followed by GI in two weeks and Dr. Algie Coffer in one week.  The patient has  been advised if she notices further abdominal pain, any bleeding in the  stool, should report to ER immediately.                                                Eduardo Osier. Sharyn Lull, M.D.    MNH/MEDQ  D:  12/11/2003  T:  12/13/2003  Job:  [id number]

## 2011-05-05 NOTE — H&P (Signed)
[facility]  Patient:    [name], [name] Visit Number: [id number] MRN: [id number]          Service Type: SUR Location: [facility] 3172 04 Attending Physician:  Danella Penton Dictated by:   Tanya Nones. Jeral Fruit, M.D. Admit Date:  09/17/2001                           History and Physical  HISTORY OF PRESENT ILLNESS:  [name] is a lady who was seen at the beginning of August 2002, because of neck pain for almost a year with radiation to both shoulders and going to the chest wall.  The patient is quite miserable and despite conservative treatment, she is not any better.  She came to see me in three different locations and the pain was getting worse with weakness and tingling sensation.  Because of that, the patient wanted to proceed with surgery.  PAST MEDICAL HISTORY: 1. Tonsillectomy. 2. Cholecystectomy. 3. Tubal ligation.  ALLERGIES:  No known drug allergies.  MEDICATIONS: 1. Neurontin. 2. Medication for high blood pressure. 3. Wellbutrin.  SOCIAL HISTORY:  She smoked two packs a day.  She drinks socially.  FAMILY HISTORY:  Negative.  REVIEW OF SYSTEMS:  Positive for balance disturbance, sinus headache, high blood pressure, heart murmur, gastritis and anxiety.  PHYSICAL EXAMINATION:  GENERAL:  The patient came to my office with her husband.  HEENT:  Head is normal.  NECK:  She was able to flex back and extend with pain to both shoulders.  LUNGS:  Clear.  HEART:  Heart sounds normal.  ABDOMEN:  Normal except for scar from previous surgery.  EXTREMITIES:  Normal.  NEUROLOGIC:  Mental status normal.  Cranial nerves normal.  Strength with both deltoids, both biceps, and positive attempts on left tricep.  The right tricep is normal.  Thenar and hypothenar muscles are normal.  Sensation normal. Reflexes normal with absence on the right tricep, right bicep and the wrists are 1+.  Coordination normal.  The MRI showed she has a  severe case of spondylosis at the level 4-5, 5-6 and 6-7. Mild at the level T4.  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  PLAN:  The patient is being admitted for surgery.  The procedure will be anterior cervicectomy with bone graft and plate.  She knows all the risks such as infection, collapse of the bone graft because of heavy history of smoking, damage to vocal cords, stroke, damage to esophagus for the surgery.  She declined another opinion. Dictated by:   Tanya Nones. Jeral Fruit, M.D. Attending Physician:  Danella Penton DD:  09/17/01 TD:  09/17/01 Job: [id number]

## 2011-05-05 NOTE — Op Note (Signed)
NAME:  [name], [name]                 ACCOUNT NO.:  [id number]   MEDICAL RECORD NO.:  [id number]          PATIENT TYPE:  AMB   LOCATION:  SDC                           FACILITY:  [facility]   PHYSICIAN:  Genia Del, M.D.DATE OF BIRTH:  1956-09-23   DATE OF PROCEDURE:  09/29/2004  DATE OF DISCHARGE:                                 OPERATIVE REPORT   PREOPERATIVE DIAGNOSIS:  Menometrorrhagia with small intrauterine lesions,  0.7 and 3 cm.   POSTOPERATIVE DIAGNOSIS:  Menometrorrhagia with small intrauterine lesions,  0.7 and 3 cm.  No intrauterine lesions visualized.   PROCEDURE:  Diagnostic hysteroscopy with dilatation and curettage.   SURGEON:  Genia Del, M.D.   ANESTHESIOLOGIST:  Raul Del, M.D.   PROCEDURE:  Under MAC analgesia, the patient is in the lithotomy position.  She is prepped with Hibiclens in the suprapubic and vulvar vaginal area.  The bladder is catheterized, and the patient is draped as usual.  The  vaginal exam reveals an anteverted uterus, small volume, mobile, no adnexal  mass.  No vaginal bleeding.  The speculum was inserted into the vagina.  The  anterior lip of the cervix was grasped with a tenaculum.  A paracervical  block is done with Nesacaine 1%, 10 mL at 4 o'clock and 10 mL at 8 o'clock.  We then proceeded with dilatation of the cervix with Heger dilators up to  #27 without difficulty.  We used a diagnostic hysteroscope to visualize the  intrauterine cavity.  No lesion is visualized.  The ostia are well seen and  pictures are taken.  The endometrium appears very thin.  We removed the  hysteroscope, proceeded with dilation and curettage.  It is done  systematically on all intrauterine surfaces with a sharp curet.  A small  amount of endometrial curetting of all blood out and sent to pathology.  Hemostasis is adequate.  All instruments are removed.  The estimated blood  loss was minimal.  The fluid deficit was 15 mL.  No complications  occurred,  and the patient was transferred to the recovery room in good status.      ML/MEDQ  D:  09/29/2004  T:  09/29/2004  Job:  [id number]

## 2011-05-05 NOTE — Consult Note (Signed)
NAME:  [name], [name]                           ACCOUNT NO.:  [id number]   MEDICAL RECORD NO.:  [id number]                   PATIENT TYPE:  INP   LOCATION:  2014                                 FACILITY:  [facility]   PHYSICIAN:  Velora Heckler, M.D.                DATE OF BIRTH:  [birth year]   DATE OF CONSULTATION:  12/03/2003  DATE OF DISCHARGE:                                   CONSULTATION   REFERRING PHYSICIAN:  James L. Randa Evens, M.D.   PRIMARY CARE PHYSICIAN:  Ricki Rodriguez, M.D.   REASON FOR CONSULTATION:  Abdominal pain, nausea and vomiting.   HISTORY:  The patient is a 55 year old white female admitted on December 01, 2003, by Dr. Orpah Cobb for weakness, dizziness, and abdominal pain.  The  patient notes onset of abdominal pain in August 2004.  This has been  relatively intermittent over the past several months.  It has now increased  in severity and is associated with intermittent nausea and vomiting.  The  patient notes approximately a 10 pound weight loss over the past month.  Past surgical history on the abdomen includes cholecystectomy and common  bile duct exploration in 1982, by Dr. Francina Ames, and probable appendectomy  in the same year.  The patient was admitted on December 01, 2003.  Laboratory studies showed profound anemia with a hemoglobin below 7.  Liver  function tests were normal.  Ultrasound of the right upper quadrant showed  no evidence of biliary dilatation.  The patient subsequently was seen by Dr.  Carman Ching and prepared for endoscopy.  She underwent upper endoscopy on  December 02, 2003, which demonstrated a pyloric channel ulcer.  On the  evening of December 02, 2003, the patient developed nausea, vomiting, and  fever.  Laboratory studies showed an elevated white blood cell count greater  than 25,000.  On December 03, 2003, an abdominal x-ray demonstrated multiple  dilated small bowel loops, and a nasogastric tube was placed. Planned  colonoscopy was cancelled.  The patient underwent CT scan of the abdomen and  pelvis which showed thick wall distal small bowel loops representing  possibly infection versus inflammatory bowel disease versus ischemia.  Antibiotics were empirically started and general surgery was consulted.   PAST MEDICAL HISTORY:  1. Status post open cholecystectomy and common bile duct exploration in     1982.  2. Status post appendectomy.  3. Status post tubal ligation.  4. Status post cervical spine fusion.  5. History of hiatal hernia.  6. History of hypertension.  7. History of hyperlipidemia.   MEDICATIONS:  1. Valium.  2. Hydrochlorothiazide.  3. Potassium chloride.  4. Aspirin.  5. Norvasc.  6. Gemfibrozil.  7. Clonidine.  8. Nexium.  9. Singular.  10.      Wellbutrin.  11.      Theo-24.  12.      Combivent.  ALLERGIES:  No known drug allergies.   SOCIAL HISTORY:  The patient is married.  They have one daughter.  She works  at [employer].   FAMILY HISTORY:  Notable for hypertension in mother, myocardial infarction  in the patient's father.  Siblings with hypertension.   REVIEW OF SYSTEMS:  A 15 system review without significant other positives  except as noted above.   PHYSICAL EXAMINATION:  GENERAL:  A 55 year old white female on [facility] [address] of  [facility].  VITAL SIGNS:  Temperature max the past 24 hours was 101.6, blood pressure  130/80, pulse 108, respirations 20, O2 saturation 97% on room air.  HEENT:  Normocephalic, atraumatic.  Sclerae are clear.  Conjunctivae are  clear.  Pupils are 4 mm bilaterally and reactive.  There is a nasogastric  tube in the left nares.  NECK:  Anterior examination of the neck shows a well-healed surgical wound  in the left anterior position, consistent with history of spine fusion.  Palpation of the thyroid shows no nodularity.  There is no anterior or  posterior cervical lymphadenopathy.  There are no supraclavicular masses.  LUNGS:   Clear to auscultation bilaterally without rales or rhonchi.  CARDIAC:  Regular rate and rhythm.  ABDOMEN:  Soft, moderately distended.  There are no bowel sounds on  auscultation.  There is a well-healed surgical wound in the right subcostal  position and a small laparoscopic surgical wound in the umbilicus.  There  are no hernias.  There are no palpable masses.  There is diffuse abdominal  tenderness with mild rebound tenderness.  EXTREMITIES:  Nontender without edema.  NEUROLOGIC:  The patient is alert and oriented without focal neurologic  deficit.   LABORATORY DATA:  White count 26, hemoglobin 12.5, platelet count 911,000.  Electrolytes are normal.  Creatinine 1.4.  Stool guaiacs are positive.   Abdominal x-rays from today show multiple dilated small bowel loops with gas  in the colon.  CT scan of the abdomen and pelvis from today shows thickened  distal small bowel walls consistent with either an infectious, ischemic, or  inflammatory bowel disease process.   IMPRESSION:  1. Abdominal pain, nausea and vomiting for five months.  2. Anemia with guaiac positive stool.  3. Status post cholecystectomy and common bile duct exploration in 1982.   PLAN:  1. Agree with empiric antibiotics with Unasyn intravenously.  2. No obstruction seen on CT scan.  Distal small bowel pathology may     represent infectious enteritis versus inflammatory bowel disease versus     ischemia, although I doubt ischemia as the cause.  Agree with placement     of     nasogastric tube and initiation of intravenous hydration.  Would maintain     the patient n.p.o. for now.  I will proceed with colonoscopy per GI when     time is available to rule out colonic pathology and perhaps to identify     underlying inflammatory bowel disease.  We will follow closely with you     at this time.                                               Velora Heckler, M.D.   TMG/MEDQ  D:  12/03/2003  T:  12/04/2003  Job:   [id number]   cc:   Fayrene Fearing L. Malon Kindle.,  M.D.  [address], [address]  [place]  [place] [address]  Fax: [phone]

## 2011-05-05 NOTE — Op Note (Signed)
NAME:  JULIANY, DAUGHETY                 ACCOUNT NO.:  [id number]   MEDICAL RECORD NO.:  [id number]          PATIENT TYPE:  OIB   LOCATION:  1516                         FACILITY:  [facility]   PHYSICIAN:  Jene Every, M.D.    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   OPERATION PERFORMED:  Lumbar decompression 3-4, 4-5, essential laminectomy  of 4, hemilaminotomies bilaterally of 3 and of 5, foraminotomies of 4 and 5.   SURGEON:  Jene Every, M.D.   ASSISTANT:  Roma Schanz, P.A.   ANESTHESIA:  General.   INDICATIONS FOR PROCEDURE:  This is a 55 year old with neurogenic  claudication secondary to severe spinal stenosis 3-4 and 4-5 confirmed with  myelogram with near complete block at those levels in the upright standing  position.  Operative intervention was indicated for decompression of both  levels.  She was a candidate for an X-Stop, felt that standard decompression  was the most appropriate in her situation.  Risks and benefits were  discussed including bleeding, infection, damage to neurovascular structures,  CSF leakage, epidural fibrosis,  __________  complications, etc.   DESCRIPTION OF PROCEDURE:  Placed in supine position.  After __________  anesthesia, 1 g Kefzol, she was placed prone on the Andrews frame, all bony  prominences well padded.  Lumbar region was prepped and draped in the usual  sterile fashion.  18 gauge spinal needle was utilized to localize the 4-5  interspace.  This was confirmed with x5 at 5-1.  Incision was made spinous  process of 3 to 5.  Subcutaneous tissue was dissected.  Electrocautery was  utilized to achieve hemostasis.  Dorsal lumbar fascia identified and divided  in line with the skin incision.  Paraspinous muscle elevated from the lamina  of 3,  4, and 5.  Cobra was placed on the spinous processes of 3 and 4 and  confirmed with x-ray.  Leksell was utilized to remove the spinous process of  3 and partially of 5.  Operating microscope was draped and brought into the  surgical field.  There was a very small stenotic interlaminar window at both  levels and we therefore decided to proceed with a central decompression.  A  2 mm Kerrison was utilized to perform central decompression of 3, removing  hypertrophic ligamentum flavum and perform bilateral hemilaminotomies at the  cephalad edge of 5 removing ligamentum flavum, performing foraminotomies at  5, removing the full central lamina of 4 and removed a fairly stenotic  central canal and lateral recesses bilaterally.  Was also stenotic at 3-4  and we decompressed both levels to the medial border of the pedicles  preserving the facets by undercutting the lateral recesses bilaterally with  a 2 mm Kerrison.  We carried this cephalad to perform bilateral  hemilaminotomies __________  3 was opened cephalad of that so that we  retained the remaining neural arch.  Disks  were evaluated and they were  unremarkable.  Bipolar cautery was utilized to achieve hemostasis.  __________  probe placed in the foramina of 3, 4, and 5 finding them to be  widely patent bilaterally.  Following this, was reconstitution of the thecal  sac.  There was no evidence of CSF leak __________ placed bone wax on the  cancellous surfaces after copious irrigation of antibiotic irrigation.  Thrombin soaked Gelfoam was placed in the laminotomy defect.  Next, removed  McCullough retractors.  Paraspinous muscles were inspected with no evidence  of active bleeding, copiously irrigated and repaired the dorsal lumbar  fascia with #1 Vicryl and interrupted figure-of-eight sutures.  Subcutaneous  tissue reapproximated with 0 and 2-0 Vicryl simple sutures.  Skin was  reapproximated with staples.  Wound was dressed sterilely.  Placed  supine on  hospital bed, extubated without difficulty, transported to recovery room,  satisfactory condition.   The patient tolerated the procedure well without complications.   ESTIMATED BLOOD LOSS:  100 mL.      Jene Every, M.D.  Electronically Signed     JB/MEDQ  D:  10/18/2006  T:  10/18/2006  Job:  [id number]

## 2011-05-05 NOTE — Op Note (Signed)
NAME:  [name], [name]                           ACCOUNT NO.:  [id number]   MEDICAL RECORD NO.:  [id number]                   PATIENT TYPE:  INP   LOCATION:  2014                                 FACILITY:  [facility]   PHYSICIAN:  James L. Malon Kindle., M.D.          DATE OF BIRTH:  02-May-1956   DATE OF PROCEDURE:  12/02/2003  DATE OF DISCHARGE:                                 OPERATIVE REPORT   PROCEDURE PERFORMED:  Esophagogastroduodenoscopy and biopsy.   MEDICATIONS:  Cetacaine spray, Valium 17.5 mg IV, fentanyl 50 mcg IV.   ENDOSCOPIST:  Llana Aliment. Randa Evens, M.D.   INDICATIONS FOR PROCEDURE:  The patient has profound anemia with low MCV and  hemoglobin below 7 without clear cut cause.  This is done to evaluate the  upper GI tract.   DESCRIPTION OF PROCEDURE:  The procedure had been explained to the patient  and consent obtained.  With the patient in left lateral decubitus position,  the Olympus scope was inserted and advanced.  The stomach was entered and  carefully examined. There were no signs of bleeding or ulceration.  The  pyloric channel was seen and passed.  I thought I saw a small ulcer in the  pyloric channel as we went by.  The duodenum was seen well.  The duodenal  bulb was completely normal with no ulceration or inflammation.  The scope  was inserted to the full extent, what was felt to be down to the third  duodenum and three or four biopsies were obtained for celiac disease.  The  mucosa was endoscopically normal.  The scope was withdrawn back into the  stomach.  Again the pyloric channel did reveal a small pyloric channel  ulcer.  There was a small amount of heme after passage of the scope that was  not there prior to the scope passing.  This was on the anterior wall.  The  stomach was carefully examined.  The antrum and body were normal.  The  fundus and cardia were seen well on the retroflex view and were normal.  The  scope was withdrawn.  The distal and proximal  esophagus were endoscopically  normal.  The patient tolerated the procedure well and was maintained on low  flow oxygen and pulse oximeter throughout the procedure.   ASSESSMENT:  1. 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  Doubt this is entire cause of her anemia     although it certainly could contribute.  2. Marked iron deficiency anemia 280.0.  Cause unclear.   PLAN:  Will check path for celiac disease.  Will check all of her other  labs.  Will transfuse to hemoglobin of 8.5.  Will plan colonoscopy tomorrow  at 4 o'clock p.m. if hemoglobin is acceptable.  James L. Malon Kindle., M.D.    [name]  D:  12/02/2003  T:  12/02/2003  Job:  [id number]   cc:   Ricki Rodriguez, M.D.  [address]  [place] [address]   Windle Guard, M.D.  [address]  [place], [place] [address]  Fax: [phone]   [name]. [name], M.D.  [address]  [place]  [place] [address]  Fax: [phone]

## 2011-05-05 NOTE — Op Note (Signed)
NAME:  [name], [name]                 ACCOUNT NO.:  [id number]   MEDICAL RECORD NO.:  [id number]          PATIENT TYPE:  AMB   LOCATION:  [facility]                         FACILITY:  [facility]   PHYSICIAN:  Excell Seltzer. Annabell Howells, M.D.    DATE OF BIRTH:  [birth year]   DATE OF PROCEDURE:  08/10/2005  DATE OF DISCHARGE:                                 OPERATIVE REPORT   PROCEDURE:  1.  Cystoscopy, bilateral retrograde pyelograms with interpretation.  2.  Hydrodistention of the bladder.  3.  Bladder biopsy with fulguration.  4.  Fulguration of urethral polyp.  5.  Installation of Pyridium and Marcaine.   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 Urethral polyp.   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 Urethral polyp.   SURGEON:  Dr. Bjorn Pippin.   ANESTHESIA:  General.   SPECIMEN:  Bladder biopsy.   COMPLICATIONS:  None.   DRAINS:  Foley catheter.   INDICATIONS:  [name] is a 55 year old white female with a history of  interstitial cystitis whose had progressive symptoms with chronic frequency,  occasional suprapubic and low back pain. She has had some pyuria and  hematuria and cytology demonstrated some atypical cells. Cystoscopy revealed  bladder wall lesions posteriorly suspicious for Hunner's ulceration versus  carcinoma in situ.   FINDINGS AND PROCEDURE:  The patient was given 500 mg of Cipro p.o.  preoperatively. She was taken to the operating room where a general  anesthetic was induced. She was placed in lithotomy position, her perineum  and genitalia were prepped with a Betadine solution and she was draped in  the usual sterile fashion. Cystoscopy was performed using a 21-French scope  and the 12 and 70 degrees. Examination revealed a large polyp at the  proximal urethra/bladder neck at the 6 o'clock position. Examination of the  bladder revealed mild  trabeculation. 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 No papillary lesions were noted. No stones were seen, the ureteral  orifices were unremarkable. The bladder wall began to bleed from the  Hunner's ulcer type areas on initial inspection.   A retrograde pyelography was performed using a 5-French open-end catheter  and Hypaque. The left ureter was initially cannulated and contrast was  instilled. This revealed a normal ureter and intrarenal collecting system.  The right ureter was then cannulated once again with a finding of a normal  ureter and collecting system.   After completion of the retrograde pyelograms, the bladder was distended at  80cmH20 pressure to capacity. This was held for approximately 5 minutes and  the bladder was drained. The efflux was markedly bloody throughout and her  capacity under anesthesia was only 500 mL.   Reinspection of bladder following hydrodistention revealed some crack in the  mucosa in the area of  the previously noted ulcerations. It was not a  transmural defect just down to muscle and fat. A biopsy using cup biopsy  forceps was obtained from one of the areas adjacent to this and the area of  previously noted inflammation. I did not do multiple biopsies because the  appearance was most typical of an inflammatory process as opposed to  carcinoma in situ. A Bugbee electrode was then used to fulgurate the biopsy  site and some surrounding areas of hemorrhage.   The urethral polyp which was large and felt potentially contributing to her  symptoms was then fulgurated with the Bugbee electrode.   A 16-French Foley catheter was then inserted, the balloon was filled with 10  mL of sterile water and the catheter was irrigated with  sterile water until  the return was clear. The bladder was then instilled with 30 mL of 0.25%  Marcaine with 400 milligrams of crushed Pyridium. The catheter was plugged,  this will be left for 15 minutes prior to draining the bladder. A B&O  suppository was then placed as well. The patient was taken down from  lithotomy position. Her anesthetic was reversed, she was moved to the  recovery room in stable condition and there were no complications.      Excell Seltzer. Annabell Howells, M.D.  Electronically Signed     JJW/MEDQ  D:  08/10/2005  T:  08/10/2005  Job:  [id number]   cc:   Otilio Connors. Gerri Spore, M.D.  [address]  [address]  [place]  [place] [address]  Fax: [phone]   Ricki Rodriguez, M.D.  [address]  [place] [address]  Fax: [phone]

## 2011-05-05 NOTE — Consult Note (Signed)
NAME:  [name], [name]                           ACCOUNT NO.:  [id number]   MEDICAL RECORD NO.:  [id number]                   PATIENT TYPE:  INP   LOCATION:  2014                                 FACILITY:  [facility]   PHYSICIAN:  James L. Malon Kindle., M.D.          DATE OF BIRTH:  08-Mar-1956   DATE OF CONSULTATION:  DATE OF DISCHARGE:                                   CONSULTATION   CONSULTING PHYSICIAN:  James L. Randa Evens, M.D.   REASON FOR CONSULTATION:  Profound weakness and anemia.   HISTORY:  A nice 55 year old patient of Dr. Windle Guard, who was seen by  my partner Dr. [name]. [name].  She apparently had an open endoscopy,  colonoscopy done 4-5 years ago that she says was negative.  She had been  doing well up until about six months ago when she began to develop vague  epigastric pain, nausea, etcetera.  It would come and go.  She has not had a  menstrual period since March and feels like she has been going through  menopause.  In the past 2-3 months, she has lost 10 pounds cause she feels  sick and nauseated and just does not want to eat.  It is not clear if the  pain is better or worse with eating, probably worse.  She was admitted with  weakness and dizziness.  She was found to have a low blood count with a  hemoglobin of 7.2, with an MCV of only 72.  The patient has not seen any  clinical bleeding.  In fact, she saw Dr. [name] in the office over the past  couple of weeks, and Dr. [name]' nurse just called her in the past couple of  days and told her, her hemoccult cards were negative.  Due to the upper  abdominal pain and the post prandial queasiness, she had a recent  gallbladder ultrasound done and it showed some hemangioma to the liver but  no other abnormalities.  Due to her upper abdominal pain, weight loss and  postprandial nausea and anemia, we were asked to see her in consultation.   ADMISSION MEDICATIONS:  1. Buspirone HCL 160 mg b.i.d.  2. Clonidine HCL 0.1 every  day.  3. Hydrochlorothiazide 25 every day.  4. Prevacid 15 every day.  5. Norvasc 5 every day.  6. Aspirin 81 mg daily.  7. Gemfibrozil.  8. Nexium 40 mg daily.  9. Singulair.  10.      Combivent p.o. 24.  11.      Wellbutrin.  12.      Valium 5 mg every day.  13.      She also takes generic Voltaren 75 mg 1-2 b.i.d.  14.      Acetaminophen.   ALLERGIES:  She is allergic to:  1. PENICILLIN.  2. Apparently VERSED makes her crazy and drops her blood pressure, and she     can  not tolerate it.   PAST MEDICAL HISTORY:  1. Coronary disease, exertional angina, but she has been cathed in the past     and this is not felt to require surgical intervention.  2. She has had surgery on her neck by Dr. Jeral Fruit.  3. She has had surgery on her thumb with chronic pain in her thumb causing     her to take Voltaren and acetaminophen.  4. Her other surgeries include a tonsillectomy and adenoidectomy.  5. Cholecystectomy.  6. Appendectomy.  7. Tubal ligation.  8. Some sort of bladder extension.  9. Hypertension.  10.      Asthma.  11.      COPD.   FAMILY HISTORY:  Mother has problems with bradycardia, has no other chronic  medical problems.  Father is healthy for his age with no chronic medical  problems.  There is cancer of the lung and breast in family members.  There  is no history of GI cancer.  No known family history of liver disease.  She  denies a family history of celiac disease or inflammatory bowel disease.   SOCIAL HISTORY:  She is married with a child who is healthy.  She smoked for  27 years but has not smoked for several years.  She uses alcohol  occasionally.  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   REVIEW OF SYSTEMS:  Remarkable for shortness of breath and minimal chest  pain with exertion.  She does have the postprandial nausea and GI pain.  Her  bowel movements have been brown and hard.  She was previously on MiraLax.  She felt this was  getting her to be dehydrated, so she stopped it.  Her main  systemic complaint seems to be profound fatigue effecting her at her job,  weakness and dizziness.   PHYSICAL EXAMINATION:  VITAL SIGNS:  Pulse 90, blood pressure 110/80.  GENERAL:  Alert, oriented, nonicteric, thin, white female.  EYES:  Sclera nonicteric.  Extraocular movements intact.  NECK:  Supple.  No lymphadenopathy.  SKIN:  There is no skin tenting.  Good skin turgor.  LUNGS:  Clear anteriorly and posteriorly.  HEART:  Regular rate and rhythm without murmurs or gallops.  ABDOMEN:  Remarkable for tenderness in the epigastrium.   ASSESSMENT:  1. Constellation of problems with profound anemia with probable iron     deficiency without obvious cause.  The patient has not had a period since     March and has not had a gastrointestinal workup in 4-5 years.  The most     likely explanation in view of her abdominal pain and dicofenac use, is     that she has an ulcer.  2. Status post cholecystectomy and appendectomy.   PLAN:  We will proceed with an endoscopy tomorrow to look for signs of an  ulcer.  We will make further recommendations after that about a colonoscopy.  This will be done with some other sedation other than Versed.                                               James L. Malon Kindle., M.D.    [name]  D:  12/01/2003  T:  12/02/2003  Job:  [id number]   cc:   Ricki Rodriguez, M.D.  [address]  [place]  [place] [address]   [name]. [name], M.D.  [address]  [place]  [place] [address]  Fax: [phone]   Windle Guard, M.D.  [address]  [place], [place] [address]  Fax: [phone]

## 2011-05-05 NOTE — Op Note (Signed)
NAME:  [name], [name]                           ACCOUNT NO.:  [id number]   MEDICAL RECORD NO.:  [id number]                   PATIENT TYPE:  INP   LOCATION:  2014                                 FACILITY:  [facility]   PHYSICIAN:  John C. [name], M.D.                 DATE OF BIRTH:  May 22, 1956   DATE OF PROCEDURE:  12/07/2003  DATE OF DISCHARGE:                                 OPERATIVE REPORT   INDICATIONS FOR PROCEDURE:  Anemia, abdominal pain, abnormal terminal ileum  on CT scan.   PROCEDURE:  The patient was placed in the left lateral decubitus position  and placed on pulse monitor with continuation low-flow oxygen delivered by  nasal cannula.  She was sedated with 175 mcg IV fentanyl and 15 mg IV  Versed.  The Olympus video colonoscope was inserted into the rectum and  advanced to the cecum. Confirmed by translumination at McBurney's point and  visualization of the ileocecal valve and appendical orifice.  The prep was  good.  The base of the cecum appeared slightly abnormal with some fibrosis,  probably related to remote appendectomy. Also, in the proximal cecum there  was a circumferential narrowing with some inflammation along the ridge of  narrowing. Distal to this, there was further inflammation at the level of  the ileocecal valve which was relatively devoid of fat and diffusely  inflamed and somewhat stenotic.  I could not get the scope to enter the  terminal ileum for better inspection.  I did take biopsies from the  ileocecal valve near the ileal orifice.  Beyond the ileocecal valve  inflammation, the mucosal was basically normal all the way down to the  rectum with no further areas of inflammation, polyps.  There were some  diverticula noted in the sigmoid colon.  The rectum appeared normal and  retroflex view of the anus revealed no obvious internal hemorrhoids.  The  scope was then withdrawn and the patient returned to the recovery room in  stable condition.  She tolerated the  procedure well and there were no  immediate complications.   IMPRESSION:  Inflammation at the level of the ileocecal valve, probably  extending into the terminal ileum.  I am not able to intubate the terminal  ileum.   PLAN:  Based on findings and CT scan report, will obtain small bowel series  to better provided to this area.  Will await biopsies to rule out  inflammatory bowel disease, lymphoma or evidence of TB.                                               John C. [name], M.D.    JCH/MEDQ  D:  12/07/2003  T:  12/07/2003  Job:  [id number]

## 2011-06-08 ENCOUNTER — Other Ambulatory Visit: Payer: Self-pay | Admitting: Urology

## 2011-06-08 ENCOUNTER — Ambulatory Visit (HOSPITAL_BASED_OUTPATIENT_CLINIC_OR_DEPARTMENT_OTHER)
Admission: RE | Admit: 2011-06-08 | Discharge: 2011-06-08 | Disposition: A | Discharge status: Home / Self Care | Payer: Medicare Other | Source: Ambulatory Visit | Attending: Urology | Admitting: Urology

## 2011-06-08 DIAGNOSIS — F411 Generalized anxiety disorder: Secondary | ICD-10-CM | POA: Insufficient documentation

## 2011-06-08 DIAGNOSIS — IMO0001 Reserved for inherently not codable concepts without codable children: Secondary | ICD-10-CM | POA: Insufficient documentation

## 2011-06-08 DIAGNOSIS — N3010 Interstitial cystitis (chronic) without hematuria: Secondary | ICD-10-CM | POA: Insufficient documentation

## 2011-06-08 DIAGNOSIS — I2510 Atherosclerotic heart disease of native coronary artery without angina pectoris: Secondary | ICD-10-CM | POA: Insufficient documentation

## 2011-06-08 DIAGNOSIS — F17200 Nicotine dependence, unspecified, uncomplicated: Secondary | ICD-10-CM | POA: Insufficient documentation

## 2011-06-08 DIAGNOSIS — I10 Essential (primary) hypertension: Secondary | ICD-10-CM | POA: Insufficient documentation

## 2011-06-08 DIAGNOSIS — Z01810 Encounter for preprocedural cardiovascular examination: Secondary | ICD-10-CM | POA: Insufficient documentation

## 2011-06-08 DIAGNOSIS — K219 Gastro-esophageal reflux disease without esophagitis: Secondary | ICD-10-CM | POA: Insufficient documentation

## 2011-06-08 LAB — POCT I-STAT, CHEM 8
BUN: 12 mg/dL (ref 6–23)
Chloride: 104 mEq/L (ref 96–112)
Creatinine, Ser: 0.8 mg/dL (ref 0.50–1.10)
Sodium: 141 mEq/L (ref 135–145)

## 2011-06-12 HISTORY — PX: OTHER SURGICAL HISTORY: SHX169

## 2011-06-20 NOTE — Op Note (Signed)
  NAMEMORA, [name]                 ACCOUNT NO.:  [id number]  MEDICAL RECORD NO.:  [id number]  LOCATION:                                 FACILITY:  PHYSICIAN:  Martina Sinner, MD DATE OF BIRTH:  1956-10-08  DATE OF PROCEDURE:  06/08/2011 DATE OF DISCHARGE:                              OPERATIVE REPORT   PREOPERATIVE DIAGNOSIS:  Interstitial cystitis.  POSTOPERATIVE DIAGNOSIS:  Interstitial cystitis.  SURGERY:  Cystoscopy, bladder hydrodistention and bladder biopsy and fulguration.  PROCEDURE IN DETAIL:  [name] has significant urinary continence and severe frequency and nocturia.  She consented with the above procedure. She had erythema in her bladder without hydrodistention in the office and my plan was to biopsy this erythematous area, it is still present. I also wanted to measure her bladder capacity under anesthesia.  The patient was prepped and draped in usual fashion.  A third of ciprofloxacin 400 mg IV was given but stopped because she developed a rash.  She was then given gentamicin 80 mg IV.  21-French scope was utilized.  I had a good look in the bladder.  She had some mild pink areas throughout her bladder but based upon the total picture, I really felt it was not keeping with interstitial cystitis with a small contracted inflamed bladder and not carcinoma in situ.  She will only be hydrodistended to 200 mL.  She started having bleeding from the bladder walls with glomerulation, as I was filling her bladder. One could argue she had Hunner ulcers near the dome on the right side but it was not definitive.  I emptied her bladder.  I had marked one spot to do a bladder biopsy.  I used a rigid biopsy forceps.  I biopsied the extraperitoneal area to the right of the ureteral orifice and cephalad approximately at 7 or 8 o'clock.  I took a superficial biopsy.  I liked the depth of the biopsy visually and it was sent to Pathology.  When I re-inspected the biopsy  site, there was one little oozer on its inferior margin that I fulgurated easily at low power.  There was no perforation.  Having said that, I saw a few glistening fibers and I thought it was best to insert a Foley catheter.  If she think she can tolerate the Foley catheter, I would rather leave it in for a couple of days.  I do not feel that strongly about this.  At the end of the case, I gently irrigated the 14-French catheter draining pink urine easily with normal saline.  [name] unfortunately appears to have a small contracted bladder from her interstitial cystitis.  It may be difficult to reach her treatment goal.  Will proceed as mentioned in my clinic notes.  I will await her bladder biopsy.          ______________________________ Martina Sinner, MD     SAM/MEDQ  D:  06/08/2011  T:  06/08/2011  Job:  [id number]  Electronically Signed by Alfredo Martinez MD on 06/20/2011 12:29:16 PM

## 2011-09-07 LAB — URINALYSIS, ROUTINE W REFLEX MICROSCOPIC
Glucose, UA: NEGATIVE
pH: 6

## 2011-09-07 LAB — URINE MICROSCOPIC-ADD ON

## 2011-09-19 LAB — POCT I-STAT, CHEM 8
BUN: 10
Calcium, Ion: 1.24
Chloride: 105
Glucose, Bld: 125 — ABNORMAL HIGH
HCT: 45

## 2011-09-19 LAB — CBC
Hemoglobin: 14.2
MCHC: 33.1
MCV: 89
RBC: 4.83

## 2011-09-19 LAB — URINALYSIS, ROUTINE W REFLEX MICROSCOPIC
Bilirubin Urine: NEGATIVE
Ketones, ur: NEGATIVE
Nitrite: NEGATIVE
Protein, ur: NEGATIVE
Urobilinogen, UA: 0.2

## 2011-09-19 LAB — DIFFERENTIAL
Basophils Relative: 1
Monocytes Absolute: 0.7
Monocytes Relative: 5
Neutro Abs: 11.3 — ABNORMAL HIGH

## 2011-09-19 LAB — POCT CARDIAC MARKERS
CKMB, poc: 2.2
Myoglobin, poc: 156

## 2011-09-19 LAB — URINE MICROSCOPIC-ADD ON

## 2011-10-02 LAB — BASIC METABOLIC PANEL
BUN: 10
CO2: 29
GFR calc non Af Amer: >60
Glucose, Bld: 100 — ABNORMAL HIGH
Potassium: 4.6

## 2011-10-02 LAB — CBC
HCT: 43.4
Hemoglobin: 14.6
MCHC: 33.7
Platelets: 299
RDW: 15.1 — ABNORMAL HIGH

## 2011-10-02 LAB — TYPE AND SCREEN

## 2011-11-03 ENCOUNTER — Other Ambulatory Visit: Payer: Self-pay | Admitting: Urology

## 2011-12-05 ENCOUNTER — Encounter (HOSPITAL_BASED_OUTPATIENT_CLINIC_OR_DEPARTMENT_OTHER): Payer: Self-pay | Admitting: *Deleted

## 2011-12-05 NOTE — Progress Notes (Signed)
NPO AFTER MN. PT ARRIVES AT 1000. NEEDS ISTAT. CURRENT EKG W/ CHART. WILL TAKE NEXIUM, NORVASC, METOPROLOL, VALIUM AND DO FLOVENT INHALER AM OF SURG. W/ SIP OF WATER.

## 2011-12-06 NOTE — H&P (Signed)
History of Present Illness   [name] has interstitial cystitis with minimal pain. Her frequency and urgency and nocturia are affecting her quality of life. She thinks she is about 20% better with a PNE a little better on the right than the left.   Her options are becoming limited and she really does not want to do PTNS.  She would like to do the stage 1 permanent implant and I think under the circumstances is very reasonable. I am going to arrange this and we will proceed accordingly. Review of systems: No change in bowel or neurologic status.   Incision look fine and the device was removed.    Past Medical History Problems  1. History of  Arthritis V13.4 2. History of  Asthma 493.90 3. History of  Depression 311 4. History of  Fibromyalgia 729.1 5. History of  Gastric Ulcer 531.90 6. History of  Heart Disease 429.9 7. History of  Heartburn 787.1 8. History of  Hypercholesterolemia 272.0 9. History of  Hypertension 401.9 10. History of  Lower Back Pain 724.2 11. History of  Murmurs 785.2  Surgical History Problems  1. History of  Back Surgery 2. History of  Bladder Irrigation 3. History of  Cholecystectomy 4. History of  Cystoscopy With Biopsy 5. History of  Cystoscopy With Dilation Of Bladder 6. History of  Neck Surgery 7. History of  Neuroplasty Right Thumb 8. History of  Tonsillectomy With Adenoidectomy  Current Meds 1. Albuterol AERS; Therapy: (Recorded:23May2012) to 2. AmLODIPine Besylate 5 MG Oral Tablet; Therapy: (Recorded:19Mar2009) to 3. Aspirin 325 MG Oral Tablet; Therapy: (Recorded:23May2012) to 4. Budeprion SR 150 MG Oral Tablet Extended Release 12 Hour; Therapy: (Recorded:19Mar2009)  to 5. Calcium 1200 1200-1000 MG-UNIT Oral Tablet Chewable; Therapy: (Recorded:23May2012) to 6. Carisoprodol 350 MG Oral Tablet; Therapy: (Recorded:19Mar2009) to 7. Cephalexin 500 MG Oral Capsule; TAKE 500 MG 3 times daily take 30 mins prior to test and then  resume on a  regular schedule; Therapy: 29Aug2012 to (Last Rx:29Aug2012)  Requested for:  29Aug2012 8. Cymbalta 60 MG Oral Capsule Delayed Release Particles; Therapy: 03Jan2012 to 9. Diazepam 5 MG Oral Tablet; Therapy: (Recorded:19Mar2009) to 10. Flovent HFA 44 MCG/ACT Inhalation Aerosol; Therapy: 04May2012 to 11. Hydrocodone-Acetaminophen 5-325 MG Oral Tablet; Therapy: 04Jan2012 to 12. Hydrocodone-Acetaminophen 5-500 MG Oral Tablet; Therapy: 21Jun2012 to 13. Iron 325 (65 Fe) MG Oral Tablet; Therapy: (Recorded:19Mar2009) to 14. Lovaza 1 GM Oral Capsule; Therapy: (Recorded:23May2012) to 15. Magnesium TABS; Therapy: (Recorded:23May2012) to 16. Metoprolol Tartrate 25 MG Oral Tablet; Therapy: 29Feb2012 to 17. NexIUM 40 MG Oral Capsule Delayed Release; Therapy: (Recorded:19Mar2009) to 18. ProAir HFA AERS; Therapy: (Recorded:08Aug2012) to 19. Tekturna 150 MG Oral Tablet; Therapy: 08Feb2012 to 20. TraMADol HCl 50 MG Oral Tablet; Therapy: 04Jan2012 to 21. TraZODone HCl 50 MG Oral Tablet; Therapy: 01Feb2012 to 22. Vitamin C 500 MG Oral Tablet; Therapy: (Recorded:19Mar2009) to 23. Vitamin D3 1000 UNIT Oral Tablet; Therapy: (Recorded:23May2012) to  Allergies Medication  1. PredniSONE TABS 2. Sulfa Drugs 3. ACE Inhibitors 4. Cipro TABS 5. Versed SOLN  Family History Problems  1. Maternal history of  Breast Cancer V16.3 2. Maternal history of  Heart Disease V17.49 mother had a pacemaker placed 3. Paternal history of  Hematuria 4. Paternal history of  Nephrolithiasis 5. Sororal history of  Nephrolithiasis 6. Paternal history of  Pneumonia 7. Paternal history of  Renal Failure  Social History Problems  1. Caffeine Use 1-2 a day 2. Marital History - Widowed 3. Occupation: disabled 4. Tobacco Use V15.82 1-2  packs a day for 30 years Denied  5. History of  Alcohol Use  Assessment Assessed  1. Urge Incontinence Of Urine 788.31 2. Urinary Frequency 788.41  Plan Urge Incontinence Of Urine  (788.31)  1. Follow-up Schedule Surgery Office  Follow-up  Done: 22Oct2012  Discussion/Summary  Place interstim  After a thorough review of the management options for the patient's condition the patient  elected to proceed with surgical therapy as noted above. We have discussed the potential benefits and risks of the procedure, side effects of the proposed treatment, the likelihood of the patient achieving the goals of the procedure, and any potential problems that might occur during the procedure or recuperation. Informed consent has been obtained.

## 2011-12-07 ENCOUNTER — Encounter (HOSPITAL_BASED_OUTPATIENT_CLINIC_OR_DEPARTMENT_OTHER): Payer: Self-pay | Admitting: Anesthesiology

## 2011-12-07 ENCOUNTER — Ambulatory Visit (HOSPITAL_BASED_OUTPATIENT_CLINIC_OR_DEPARTMENT_OTHER): Payer: Medicare Other | Admitting: Anesthesiology

## 2011-12-07 ENCOUNTER — Ambulatory Visit (HOSPITAL_COMMUNITY): Payer: Medicare Other

## 2011-12-07 ENCOUNTER — Encounter (HOSPITAL_BASED_OUTPATIENT_CLINIC_OR_DEPARTMENT_OTHER)
Admission: RE | Disposition: A | Discharge status: Home / Self Care | Payer: Self-pay | Source: Ambulatory Visit | Attending: Urology

## 2011-12-07 ENCOUNTER — Encounter (HOSPITAL_BASED_OUTPATIENT_CLINIC_OR_DEPARTMENT_OTHER): Payer: Self-pay | Admitting: *Deleted

## 2011-12-07 ENCOUNTER — Ambulatory Visit (HOSPITAL_BASED_OUTPATIENT_CLINIC_OR_DEPARTMENT_OTHER)
Admission: RE | Admit: 2011-12-07 | Discharge: 2011-12-07 | Disposition: A | Discharge status: Home / Self Care | Payer: Medicare Other | Source: Ambulatory Visit | Attending: Urology | Admitting: Urology

## 2011-12-07 DIAGNOSIS — IMO0001 Reserved for inherently not codable concepts without codable children: Secondary | ICD-10-CM | POA: Insufficient documentation

## 2011-12-07 DIAGNOSIS — J4489 Other specified chronic obstructive pulmonary disease: Secondary | ICD-10-CM | POA: Insufficient documentation

## 2011-12-07 DIAGNOSIS — I10 Essential (primary) hypertension: Secondary | ICD-10-CM | POA: Insufficient documentation

## 2011-12-07 DIAGNOSIS — J449 Chronic obstructive pulmonary disease, unspecified: Secondary | ICD-10-CM | POA: Insufficient documentation

## 2011-12-07 DIAGNOSIS — I2510 Atherosclerotic heart disease of native coronary artery without angina pectoris: Secondary | ICD-10-CM | POA: Insufficient documentation

## 2011-12-07 DIAGNOSIS — N3941 Urge incontinence: Secondary | ICD-10-CM | POA: Insufficient documentation

## 2011-12-07 DIAGNOSIS — R350 Frequency of micturition: Secondary | ICD-10-CM | POA: Insufficient documentation

## 2011-12-07 DIAGNOSIS — I209 Angina pectoris, unspecified: Secondary | ICD-10-CM | POA: Insufficient documentation

## 2011-12-07 DIAGNOSIS — K219 Gastro-esophageal reflux disease without esophagitis: Secondary | ICD-10-CM | POA: Insufficient documentation

## 2011-12-07 HISTORY — PX: INTERSTIM IMPLANT PLACEMENT: SHX5130

## 2011-12-07 LAB — POCT I-STAT, CHEM 8
BUN: 11 mg/dL (ref 6–23)
Chloride: 103 mEq/L (ref 96–112)
HCT: 41 % (ref 36.0–46.0)
Potassium: 3.5 mEq/L (ref 3.5–5.1)
Sodium: 143 mEq/L (ref 135–145)

## 2011-12-07 SURGERY — INSERTION, SACRAL NERVE STIMULATOR, INTERSTIM, STAGE 1
Anesthesia: Monitor Anesthesia Care | Site: Back | Laterality: Right | Wound class: Clean

## 2011-12-07 MED ORDER — CEPHALEXIN 250 MG PO CAPS: 250.0000 mg | ORAL_CAPSULE | Freq: Three times a day (TID) | ORAL | Status: AC

## 2011-12-07 MED ORDER — LACTATED RINGERS IV SOLN
INTRAVENOUS | Status: DC
  Administered 2011-12-07: 10:00:00 via INTRAVENOUS

## 2011-12-07 MED ORDER — CEFAZOLIN SODIUM 1-5 GM-% IV SOLN
1.0000 g | INTRAVENOUS | Status: AC
  Administered 2011-12-07: 1 g via INTRAVENOUS

## 2011-12-07 MED ORDER — PROPOFOL 10 MG/ML IV EMUL
INTRAVENOUS | Status: DC | PRN
  Administered 2011-12-07: 75 ug/kg/min via INTRAVENOUS

## 2011-12-07 MED ORDER — SODIUM CHLORIDE 0.9 % IR SOLN
Status: DC | PRN
  Administered 2011-12-07: 12:00:00

## 2011-12-07 MED ORDER — HYDROCODONE-ACETAMINOPHEN 5-325 MG PO TABS
1.0000 | ORAL_TABLET | Freq: Four times a day (QID) | ORAL | Status: DC | PRN
  Administered 2011-12-07 (×2): 1 via ORAL

## 2011-12-07 MED ORDER — MEPERIDINE HCL 25 MG/ML IJ SOLN: 6.2500 mg | INTRAMUSCULAR | Status: DC | PRN

## 2011-12-07 MED ORDER — FENTANYL CITRATE 0.05 MG/ML IJ SOLN
INTRAMUSCULAR | Status: DC | PRN
  Administered 2011-12-07: 100 ug via INTRAVENOUS

## 2011-12-07 MED ORDER — ALBUTEROL SULFATE (2.5 MG/3ML) 0.083% IN NEBU
2.5000 mg | INHALATION_SOLUTION | RESPIRATORY_TRACT | Status: AC
  Administered 2011-12-07: 2.5 mg via RESPIRATORY_TRACT

## 2011-12-07 MED ORDER — LACTATED RINGERS IV SOLN: INTRAVENOUS | Status: DC

## 2011-12-07 MED ORDER — BUPIVACAINE-EPINEPHRINE 0.5% -1:200000 IJ SOLN
INTRAMUSCULAR | Status: DC | PRN
  Administered 2011-12-07: 30 mL

## 2011-12-07 MED ORDER — HYDROCODONE-ACETAMINOPHEN 5-500 MG PO TABS: 1.0000 | ORAL_TABLET | Freq: Four times a day (QID) | ORAL | Status: AC | PRN

## 2011-12-07 MED ORDER — PROMETHAZINE HCL 25 MG/ML IJ SOLN: 6.2500 mg | INTRAMUSCULAR | Status: DC | PRN

## 2011-12-07 MED ORDER — LIDOCAINE-EPINEPHRINE (PF) 1 %-1:200000 IJ SOLN
INTRAMUSCULAR | Status: DC | PRN
  Administered 2011-12-07: 1 mL

## 2011-12-07 MED ORDER — FENTANYL CITRATE 0.05 MG/ML IJ SOLN: 25.0000 ug | INTRAMUSCULAR | Status: DC | PRN

## 2011-12-07 MED ORDER — PROPOFOL 10 MG/ML IV EMUL
INTRAVENOUS | Status: DC | PRN
  Administered 2011-12-07: 40 mg via INTRAVENOUS

## 2011-12-07 SURGICAL SUPPLY — 54 items
ADH SKN CLS APL DERMABOND .7 (GAUZE/BANDAGES/DRESSINGS)
ANTNA NRSTM XTRN TELEM NS LF (UROLOGICAL SUPPLIES)
APL SKNCLS STERI-STRIP NONHPOA (GAUZE/BANDAGES/DRESSINGS) ×2
BAG URINE DRAINAGE (UROLOGICAL SUPPLIES) ×2 IMPLANT
BANDAGE ADHESIVE 1X3 (GAUZE/BANDAGES/DRESSINGS) IMPLANT
BENZOIN TINCTURE PRP APPL 2/3 (GAUZE/BANDAGES/DRESSINGS) ×4 IMPLANT
BLADE SURG 15 STRL LF DISP TIS (BLADE) ×1 IMPLANT
BLADE SURG 15 STRL SS (BLADE) ×2
CATH FOLEY 2WAY SLVR  5CC 16FR (CATHETERS) ×1
CATH FOLEY 2WAY SLVR 5CC 16FR (CATHETERS) ×1 IMPLANT
CLOTH BEACON ORANGE TIMEOUT ST (SAFETY) ×2 IMPLANT
COVER MAYO STAND STRL (DRAPES) ×2 IMPLANT
COVER TABLE BACK 60X90 (DRAPES) ×2 IMPLANT
DERMABOND ADVANCED (GAUZE/BANDAGES/DRESSINGS)
DERMABOND ADVANCED .7 DNX12 (GAUZE/BANDAGES/DRESSINGS) IMPLANT
DRAPE C-ARM 42X72 X-RAY (DRAPES) ×2 IMPLANT
DRAPE INCISE 23X17 IOBAN STRL (DRAPES) ×1
DRAPE INCISE 23X17 STRL (DRAPES) ×1 IMPLANT
DRAPE INCISE IOBAN 23X17 STRL (DRAPES) ×1 IMPLANT
DRAPE LAPAROSCOPIC ABDOMINAL (DRAPES) ×2 IMPLANT
DRAPE LG THREE QUARTER DISP (DRAPES) ×2 IMPLANT
DRESSING TELFA 8X3 (GAUZE/BANDAGES/DRESSINGS) ×2 IMPLANT
DRSG TEGADERM 2-3/8X2-3/4 SM (GAUZE/BANDAGES/DRESSINGS) ×1 IMPLANT
DRSG TEGADERM 4X4.75 (GAUZE/BANDAGES/DRESSINGS) ×2 IMPLANT
ELECT REM PT RETURN 9FT ADLT (ELECTROSURGICAL) ×2
ELECTRODE REM PT RTRN 9FT ADLT (ELECTROSURGICAL) ×1 IMPLANT
GAUZE SPONGE 4X4 12PLY STRL LF (GAUZE/BANDAGES/DRESSINGS) ×4 IMPLANT
GLOVE BIO SURGEON STRL SZ7.5 (GLOVE) ×2 IMPLANT
GLOVE INDICATOR 6.5 STRL GRN (GLOVE) ×3 IMPLANT
GOWN PREVENTION PLUS LG XLONG (DISPOSABLE) ×2 IMPLANT
GOWN STRL REIN XL XLG (GOWN DISPOSABLE) ×2 IMPLANT
HOLDER FOLEY CATH W/STRAP (MISCELLANEOUS) ×2 IMPLANT
INTRODUCER GUIDE DILATR SHEATH (SET/KITS/TRAYS/PACK) ×2 IMPLANT
LEAD (Lead) ×2 IMPLANT
NEEDLE FORAMEN 20GA 3.5  9CM (NEEDLE) IMPLANT
NEEDLE FORAMEN 20GA 5  12.5CM (NEEDLE) IMPLANT
NEEDLE HYPO 22GX1.5 SAFETY (NEEDLE) ×2 IMPLANT
PACK BASIN DAY SURGERY FS (CUSTOM PROCEDURE TRAY) ×2 IMPLANT
PENCIL BUTTON HOLSTER BLD 10FT (ELECTRODE) IMPLANT
PROGRAMMER ANTENNA EXT (UROLOGICAL SUPPLIES) IMPLANT
PROGRAMMER STIMUL 2.2X1.1X3.7 (UROLOGICAL SUPPLIES) IMPLANT
SPONGE GAUZE 4X4 12PLY (GAUZE/BANDAGES/DRESSINGS) ×1 IMPLANT
STRIP CLOSURE SKIN 1/2X4 (GAUZE/BANDAGES/DRESSINGS) ×2 IMPLANT
SUT SILK 2 0 (SUTURE) ×2
SUT SILK 2-0 18XBRD TIE 12 (SUTURE) ×1 IMPLANT
SUT VIC AB 3-0 SH 27 (SUTURE) ×4
SUT VIC AB 3-0 SH 27X BRD (SUTURE) ×2 IMPLANT
SUT VICRYL 4-0 PS2 18IN ABS (SUTURE) ×5 IMPLANT
SYR BULB IRRIGATION 50ML (SYRINGE) ×2 IMPLANT
SYR CONTROL 10ML LL (SYRINGE) ×2 IMPLANT
SYRINGE 10CC LL (SYRINGE) ×2 IMPLANT
TOWEL OR 17X24 6PK STRL BLUE (TOWEL DISPOSABLE) ×4 IMPLANT
TRAY DSU PREP LF (CUSTOM PROCEDURE TRAY) ×2 IMPLANT
WATER STERILE IRR 500ML POUR (IV SOLUTION) ×2 IMPLANT

## 2011-12-07 NOTE — Transfer of Care (Signed)
Immediate Anesthesia Transfer of Care Note  Patient: [name]  Procedure(s) Performed:  Leane Platt IMPLANT FIRST STAGE  Patient Location: PACU  Anesthesia Type: MAC  Level of Consciousness: awake, alert , oriented and patient cooperative  Airway & Oxygen Therapy: Patient Spontanous Breathing and Patient connected to face mask oxygen  Post-op Assessment: Report given to PACU RN and Post -op Vital signs reviewed and stable  Post vital signs: Reviewed and stable  Complications: No apparent anesthesia complications

## 2011-12-07 NOTE — Progress Notes (Signed)
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

## 2011-12-07 NOTE — Interval H&P Note (Signed)
History and Physical Interval Note:  12/07/2011 7:43 AM  [name]  has presented today for surgery, with the diagnosis of urgency incontinence and frequency  The various methods of treatment have been discussed with the patient and family. After consideration of risks, benefits and other options for treatment, the patient has consented to  Procedure(s): INTERSTIM IMPLANT FIRST STAGE as a surgical intervention .  The patients' history has been reviewed, patient examined, no change in status, stable for surgery.  I have reviewed the patients' chart and labs.  Questions were answered to the patient's satisfaction.     Merridy Pascoe A

## 2011-12-07 NOTE — Progress Notes (Signed)
Reported to Brita Romp, RN as caregiver

## 2011-12-07 NOTE — H&P (View-Only) (Signed)
NPO AFTER MN. PT ARRIVES AT 1000. NEEDS ISTAT. CURRENT EKG W/ CHART. WILL TAKE NEXIUM, NORVASC, METOPROLOL, VALIUM AND DO FLOVENT INHALER AM OF SURG. W/ SIP OF WATER. 

## 2011-12-07 NOTE — Op Note (Signed)
Preoperative diagnosis: Refractory urge incontinence Postoperative diagnosis: Refractory urge incontinence Surgeon Dr. Lorin Picket Mcarthur Ivins Surgery: Placement of InterStim stage I  The patient is refractory urge incontinence and frequency until medication. She consented to the procedure and was placed in the prone position. Preoperative antibiotics were given.  I marked the S3 foramina on the right side with fluoroscopy. I instilled 10 cc of a lidocaine epinephrine mixture. Framing needle was easily placed in the correct foramina. She had an excellent bellows and toe response.  Inner needle was removed. Guide was placed to appropriate depth. White trocar was placed to appropriate depth. Inner sheath was removed. The lead was placed to appropriate depth and its inner wire pulled back 1 inch. Position 3 and 4 had excellent bellows and toe response at very low power. She had minimal benefit at setting 0 and 1 but we felt that she would have a vaginal response since fluoroscopically and based upon position 3-4 responses it was in excellent position  I made a 2 cm right flank incision after instilling 50 cc of a lidocaine epinephrine mixture. I made a subcutaneous pocket. With the passing device I brought the lead medial to lateral. I attached it to the lead connection device utilizing a screwdriver and boot and 2-0 silk. I then at this medial on the other side of the midline.  All incisions were irrigated. 3-0 Vicryl and 4-0 Vicryl was used for the buttock incision and interrupted 4-0 Vicryl suture the midline incision. Appropriate sterile dressings were applied hopefully should the procedure will reacher treatment all ended dictation

## 2011-12-07 NOTE — Anesthesia Preprocedure Evaluation (Addendum)
Anesthesia Evaluation  Patient identified by MRN, date of birth, ID band Patient awake    Reviewed: Allergy & Precautions, H&P , NPO status , Patient's Chart, lab work & pertinent test results  Airway Mallampati: II TM Distance: >3 FB Neck ROM: Full    Dental No notable dental hx.    Pulmonary neg pulmonary ROS, asthma , COPDCurrent Smoker,  clear to auscultation  Pulmonary exam normal       Cardiovascular hypertension, Pt. on medications + angina (stable) with exertion + CAD and neg cardio ROS Regular Normal    Neuro/Psych  Neuromuscular disease Negative Neurological ROS  Negative Psych ROS   GI/Hepatic negative GI ROS, Neg liver ROS, GERD-  Medicated and Controlled,  Endo/Other  Negative Endocrine ROS  Renal/GU negative Renal ROS  Genitourinary negative   Musculoskeletal negative musculoskeletal ROS (+) Fibromyalgia -  Abdominal   Peds negative pediatric ROS (+)  Hematology negative hematology ROS (+)   Anesthesia Other Findings   Reproductive/Obstetrics negative OB ROS                           Anesthesia Physical Anesthesia Plan  ASA: III  Anesthesia Plan: MAC   Post-op Pain Management:    Induction: Intravenous  Airway Management Planned:   Additional Equipment:   Intra-op Plan:   Post-operative Plan:   Informed Consent: I have reviewed the patients History and Physical, chart, labs and discussed the procedure including the risks, benefits and alternatives for the proposed anesthesia with the patient or authorized representative who has indicated his/her understanding and acceptance.   Dental advisory given  Plan Discussed with:   Anesthesia Plan Comments:        Anesthesia Quick Evaluation

## 2011-12-07 NOTE — Anesthesia Postprocedure Evaluation (Signed)
  Anesthesia Post-op Note  Patient: [name]  Procedure(s) Performed:  Leane Platt IMPLANT FIRST STAGE  Patient Location: PACU  Anesthesia Type: MAC  Level of Consciousness: awake and alert   Airway and Oxygen Therapy: Patient Spontanous Breathing  Post-op Pain: mild  Post-op Assessment: Post-op Vital signs reviewed, Patient's Cardiovascular Status Stable, Respiratory Function Stable, Patent Airway and No signs of Nausea or vomiting  Post-op Vital Signs: stable  Complications: No apparent anesthesia complications

## 2011-12-08 ENCOUNTER — Encounter (HOSPITAL_BASED_OUTPATIENT_CLINIC_OR_DEPARTMENT_OTHER): Payer: Self-pay | Admitting: Urology

## 2011-12-08 ENCOUNTER — Other Ambulatory Visit: Payer: Self-pay | Admitting: Urology

## 2011-12-08 NOTE — Progress Notes (Signed)
Npo after mn. Pt arrives at 0615. Hx updated . Needs istat. Current ekg in epic. Will norvasc, metoprolol, valium, nexium, and do flovent am of surg. W/ sips of water.  Today pt states incisional pain rated "10", is to get pain med rx filled today, and states dressing clean, dry and intact.

## 2011-12-14 ENCOUNTER — Encounter (HOSPITAL_BASED_OUTPATIENT_CLINIC_OR_DEPARTMENT_OTHER)
Admission: RE | Disposition: A | Discharge status: Home / Self Care | Payer: Self-pay | Source: Ambulatory Visit | Attending: Urology

## 2011-12-14 ENCOUNTER — Ambulatory Visit (HOSPITAL_BASED_OUTPATIENT_CLINIC_OR_DEPARTMENT_OTHER)
Admission: RE | Admit: 2011-12-14 | Discharge: 2011-12-14 | Disposition: A | Discharge status: Home / Self Care | Payer: Medicare Other | Source: Ambulatory Visit | Attending: Urology | Admitting: Urology

## 2011-12-14 ENCOUNTER — Encounter (HOSPITAL_BASED_OUTPATIENT_CLINIC_OR_DEPARTMENT_OTHER): Payer: Self-pay | Admitting: *Deleted

## 2011-12-14 ENCOUNTER — Encounter (HOSPITAL_BASED_OUTPATIENT_CLINIC_OR_DEPARTMENT_OTHER): Payer: Self-pay | Admitting: Anesthesiology

## 2011-12-14 ENCOUNTER — Ambulatory Visit (HOSPITAL_BASED_OUTPATIENT_CLINIC_OR_DEPARTMENT_OTHER): Payer: Medicare Other | Admitting: Anesthesiology

## 2011-12-14 DIAGNOSIS — R350 Frequency of micturition: Secondary | ICD-10-CM | POA: Insufficient documentation

## 2011-12-14 DIAGNOSIS — IMO0001 Reserved for inherently not codable concepts without codable children: Secondary | ICD-10-CM | POA: Insufficient documentation

## 2011-12-14 DIAGNOSIS — N3941 Urge incontinence: Secondary | ICD-10-CM

## 2011-12-14 DIAGNOSIS — Z7982 Long term (current) use of aspirin: Secondary | ICD-10-CM | POA: Insufficient documentation

## 2011-12-14 DIAGNOSIS — Z79899 Other long term (current) drug therapy: Secondary | ICD-10-CM | POA: Insufficient documentation

## 2011-12-14 DIAGNOSIS — I10 Essential (primary) hypertension: Secondary | ICD-10-CM | POA: Insufficient documentation

## 2011-12-14 DIAGNOSIS — E7800 Pure hypercholesterolemia, unspecified: Secondary | ICD-10-CM | POA: Insufficient documentation

## 2011-12-14 DIAGNOSIS — J45909 Unspecified asthma, uncomplicated: Secondary | ICD-10-CM | POA: Insufficient documentation

## 2011-12-14 HISTORY — DX: Cough: R05

## 2011-12-14 HISTORY — DX: Urge incontinence: N39.41

## 2011-12-14 HISTORY — DX: Frequency of micturition: R35.0

## 2011-12-14 HISTORY — DX: Fibromyalgia: M79.7

## 2011-12-14 HISTORY — DX: Agoraphobia without panic disorder: F40.02

## 2011-12-14 HISTORY — DX: Atherosclerotic heart disease of native coronary artery without angina pectoris: I25.10

## 2011-12-14 HISTORY — DX: Major depressive disorder, single episode, unspecified: F32.9

## 2011-12-14 HISTORY — DX: Iron deficiency anemia, unspecified: D50.9

## 2011-12-14 HISTORY — DX: Nocturia: R35.1

## 2011-12-14 HISTORY — DX: Anesthesia of skin: R20.0

## 2011-12-14 HISTORY — DX: Gastro-esophageal reflux disease without esophagitis: K21.9

## 2011-12-14 HISTORY — DX: Anxiety disorder, unspecified: F41.9

## 2011-12-14 HISTORY — DX: Unspecified osteoarthritis, unspecified site: M19.90

## 2011-12-14 HISTORY — DX: Other specified cough: R05.8

## 2011-12-14 HISTORY — DX: Depression, unspecified: F32.A

## 2011-12-14 HISTORY — DX: Other postprocedural complications of skin and subcutaneous tissue: L76.82

## 2011-12-14 HISTORY — DX: Insomnia, unspecified: G47.00

## 2011-12-14 HISTORY — DX: Irritable bowel syndrome, unspecified: K58.9

## 2011-12-14 HISTORY — PX: INTERSTIM IMPLANT PLACEMENT: SHX5130

## 2011-12-14 HISTORY — DX: Reserved for concepts with insufficient information to code with codable children: IMO0002

## 2011-12-14 HISTORY — DX: Essential (primary) hypertension: I10

## 2011-12-14 HISTORY — DX: Personal history of other diseases of the digestive system: Z87.19

## 2011-12-14 LAB — POCT I-STAT 4, (NA,K, GLUC, HGB,HCT)
HCT: 44 % (ref 36.0–46.0)
Hemoglobin: 15 g/dL (ref 12.0–15.0)

## 2011-12-14 SURGERY — INSERTION, SACRAL NERVE STIMULATOR, INTERSTIM, STAGE 2
Anesthesia: Monitor Anesthesia Care | Site: Flank | Laterality: Right | Wound class: Clean

## 2011-12-14 MED ORDER — PROMETHAZINE HCL 25 MG/ML IJ SOLN: 6.2500 mg | INTRAMUSCULAR | Status: DC | PRN

## 2011-12-14 MED ORDER — LIDOCAINE-EPINEPHRINE (PF) 1 %-1:200000 IJ SOLN
INTRAMUSCULAR | Status: DC | PRN
  Administered 2011-12-14: 24 mL

## 2011-12-14 MED ORDER — ONDANSETRON HCL 4 MG/2ML IJ SOLN
INTRAMUSCULAR | Status: DC | PRN
  Administered 2011-12-14: 4 mg via INTRAVENOUS

## 2011-12-14 MED ORDER — SODIUM CHLORIDE 0.9 % IR SOLN
Status: DC | PRN
  Administered 2011-12-14: 08:00:00

## 2011-12-14 MED ORDER — BUPIVACAINE-EPINEPHRINE 0.5% -1:200000 IJ SOLN
INTRAMUSCULAR | Status: DC | PRN
  Administered 2011-12-14: 24 mL

## 2011-12-14 MED ORDER — DIPHENHYDRAMINE HCL 50 MG/ML IJ SOLN
INTRAMUSCULAR | Status: DC | PRN
  Administered 2011-12-14: 12.5 mg via INTRAVENOUS

## 2011-12-14 MED ORDER — CEPHALEXIN 250 MG PO CAPS: 250.0000 mg | ORAL_CAPSULE | Freq: Three times a day (TID) | ORAL | Status: AC

## 2011-12-14 MED ORDER — GLYCOPYRROLATE 0.2 MG/ML IJ SOLN
INTRAMUSCULAR | Status: DC | PRN
  Administered 2011-12-14: 0.2 mg via INTRAVENOUS

## 2011-12-14 MED ORDER — LACTATED RINGERS IV SOLN
INTRAVENOUS | Status: DC
  Administered 2011-12-14: 100 mL/h via INTRAVENOUS
  Administered 2011-12-14: 07:00:00 via INTRAVENOUS

## 2011-12-14 MED ORDER — LACTATED RINGERS IV SOLN: INTRAVENOUS | Status: DC

## 2011-12-14 MED ORDER — CEFAZOLIN SODIUM 1-5 GM-% IV SOLN
1.0000 g | INTRAVENOUS | Status: AC
  Administered 2011-12-14: 1 g via INTRAVENOUS

## 2011-12-14 MED ORDER — PROPOFOL 10 MG/ML IV EMUL
INTRAVENOUS | Status: DC | PRN
  Administered 2011-12-14: 100 ug/kg/min via INTRAVENOUS

## 2011-12-14 MED ORDER — FENTANYL CITRATE 0.05 MG/ML IJ SOLN: 25.0000 ug | INTRAMUSCULAR | Status: DC | PRN

## 2011-12-14 MED ORDER — FENTANYL CITRATE 0.05 MG/ML IJ SOLN
INTRAMUSCULAR | Status: DC | PRN
  Administered 2011-12-14: 50 ug via INTRAVENOUS
  Administered 2011-12-14 (×4): 25 ug via INTRAVENOUS

## 2011-12-14 SURGICAL SUPPLY — 48 items
ADH SKN CLS APL DERMABOND .7 (GAUZE/BANDAGES/DRESSINGS)
ANTNA NRSTM XTRN TELEM NS LF (UROLOGICAL SUPPLIES) ×1
APL SKNCLS STERI-STRIP NONHPOA (GAUZE/BANDAGES/DRESSINGS)
BAG URINE LEG 500ML (DRAIN) IMPLANT
BANDAGE ADHESIVE 1X3 (GAUZE/BANDAGES/DRESSINGS) ×4 IMPLANT
BENZOIN TINCTURE PRP APPL 2/3 (GAUZE/BANDAGES/DRESSINGS) IMPLANT
BLADE HEX COATED 2.75 (ELECTRODE) ×2 IMPLANT
BLADE SURG 15 STRL LF DISP TIS (BLADE) ×1 IMPLANT
BLADE SURG 15 STRL SS (BLADE) ×2
CATH FOLEY 2WAY SLVR  5CC 16FR (CATHETERS)
CATH FOLEY 2WAY SLVR 5CC 16FR (CATHETERS) IMPLANT
CLOTH BEACON ORANGE TIMEOUT ST (SAFETY) ×2 IMPLANT
COVER MAYO STAND STRL (DRAPES) ×2 IMPLANT
COVER PROBE W GEL 5X96 (DRAPES) ×2 IMPLANT
COVER TABLE BACK 60X90 (DRAPES) ×2 IMPLANT
DERMABOND ADVANCED (GAUZE/BANDAGES/DRESSINGS)
DERMABOND ADVANCED .7 DNX12 (GAUZE/BANDAGES/DRESSINGS) IMPLANT
DRAPE INCISE 23X17 IOBAN STRL (DRAPES) ×1
DRAPE INCISE 23X17 STRL (DRAPES) ×1 IMPLANT
DRAPE INCISE IOBAN 23X17 STRL (DRAPES) ×1 IMPLANT
DRAPE LAPAROSCOPIC ABDOMINAL (DRAPES) ×2 IMPLANT
DRESSING TELFA 8X3 (GAUZE/BANDAGES/DRESSINGS) ×2 IMPLANT
DRSG TEGADERM 4X4.75 (GAUZE/BANDAGES/DRESSINGS) ×2 IMPLANT
ELECT REM PT RETURN 9FT ADLT (ELECTROSURGICAL) ×2
ELECTRODE REM PT RTRN 9FT ADLT (ELECTROSURGICAL) IMPLANT
GAUZE SPONGE 4X4 12PLY STRL LF (GAUZE/BANDAGES/DRESSINGS) ×4 IMPLANT
GLOVE BIO SURGEON STRL SZ7.5 (GLOVE) ×2 IMPLANT
GLOVE BIOGEL PI IND STRL 6.5 (GLOVE) IMPLANT
GLOVE BIOGEL PI INDICATOR 6.5 (GLOVE) ×1
GOWN PREVENTION PLUS LG XLONG (DISPOSABLE) ×2 IMPLANT
GOWN STRL REIN XL XLG (GOWN DISPOSABLE) ×2 IMPLANT
INTRODUCER GUIDE DILATR SHEATH (SET/KITS/TRAYS/PACK) IMPLANT
NEEDLE FORAMEN 20GA 3.5  9CM (NEEDLE) IMPLANT
NEEDLE FORAMEN 20GA 5  12.5CM (NEEDLE) IMPLANT
NEEDLE HYPO 22GX1.5 SAFETY (NEEDLE) ×1 IMPLANT
PACK BASIN DAY SURGERY FS (CUSTOM PROCEDURE TRAY) ×2 IMPLANT
PENCIL BUTTON HOLSTER BLD 10FT (ELECTRODE) ×1 IMPLANT
PROGRAMMER ANTENNA EXT (UROLOGICAL SUPPLIES) ×2 IMPLANT
PROGRAMMER STIMUL 2.2X1.1X3.7 (UROLOGICAL SUPPLIES) ×2 IMPLANT
STAPLER VISISTAT 35W (STAPLE) IMPLANT
STIMULATOR INTERSTIM 2X1.7X.3 (Orthopedic Implant) ×2 IMPLANT
STRIP CLOSURE SKIN 1/2X4 (GAUZE/BANDAGES/DRESSINGS) ×2 IMPLANT
SUT VIC AB 3-0 SH 27 (SUTURE) ×2
SUT VIC AB 3-0 SH 27X BRD (SUTURE) ×1 IMPLANT
SUT VICRYL 4-0 PS2 18IN ABS (SUTURE) ×2 IMPLANT
SYR BULB IRRIGATION 50ML (SYRINGE) ×2 IMPLANT
SYR CONTROL 10ML LL (SYRINGE) ×1 IMPLANT
TOWEL OR 17X24 6PK STRL BLUE (TOWEL DISPOSABLE) ×4 IMPLANT

## 2011-12-14 NOTE — Anesthesia Preprocedure Evaluation (Addendum)
Anesthesia Evaluation  Patient identified by MRN, date of birth, ID band Patient awake    Reviewed: Allergy & Precautions, H&P , NPO status , Patient's Chart, lab work & pertinent test results, reviewed documented beta blocker date and time   Airway Mallampati: II TM Distance: >3 FB Neck ROM: full    Dental No notable dental hx. (+) Teeth Intact and Dental Advisory Given   Pulmonary neg pulmonary ROS, asthma ,  Moderate asthma clear to auscultation  Pulmonary exam normal       Cardiovascular Exercise Tolerance: Good hypertension, On Home Beta Blockers + angina with exertion + CAD and neg cardio ROS regular Normal Stable, infrequent angina   Neuro/Psych PSYCHIATRIC DISORDERS Anxiety Depression Cervical fusion Negative Neurological ROS     GI/Hepatic negative GI ROS, Neg liver ROS, GERD-  Medicated and Controlled,  Endo/Other  Negative Endocrine ROS  Renal/GU negative Renal ROS  Genitourinary negative   Musculoskeletal  (+) Fibromyalgia -  Abdominal   Peds  Hematology negative hematology ROS (+)   Anesthesia Other Findings   Reproductive/Obstetrics negative OB ROS                         Anesthesia Physical Anesthesia Plan  ASA: III  Anesthesia Plan: MAC   Post-op Pain Management:    Induction: Intravenous  Airway Management Planned: Simple Face Mask  Additional Equipment:   Intra-op Plan:   Post-operative Plan:   Informed Consent: I have reviewed the patients History and Physical, chart, labs and discussed the procedure including the risks, benefits and alternatives for the proposed anesthesia with the patient or authorized representative who has indicated his/her understanding and acceptance.   Dental Advisory Given  Plan Discussed with: CRNA  Anesthesia Plan Comments:        Anesthesia Quick Evaluation

## 2011-12-14 NOTE — Transfer of Care (Signed)
Immediate Anesthesia Transfer of Care Note  Patient: [name]  Procedure(s) Performed:  Leane Platt IMPLANT SECOND STAGE - rad tech ok by vickie at main  Patient Location: Patient transported to PACU with oxygen via face mask at 4 Liters / Min  Anesthesia Type: MAC  Level of Consciousness: awake and alert   Airway & Oxygen Therapy: Patient Spontanous Breathing and Patient connected to face mask oxygen  Post-op Assessment: Report given to PACU RN and Post -op Vital signs reviewed and stable  Post vital signs: Reviewed and stable  Complications: No apparent anesthesia complications

## 2011-12-14 NOTE — Progress Notes (Signed)
Dr MacDiarmid in to see pt prior to D/C 

## 2011-12-14 NOTE — Interval H&P Note (Signed)
History and Physical Interval Note:  12/14/2011 7:20 AM  [name]  has presented today for surgery, with the diagnosis of urgency incontinence and frequency  The various methods of treatment have been discussed with the patient and family. After consideration of risks, benefits and other options for treatment, the patient has consented to  Procedure(s): INTERSTIM IMPLANT SECOND STAGE as a surgical intervention .  The patients' history has been reviewed, patient examined, no change in status, stable for surgery.  I have reviewed the patients' chart and labs.  Questions were answered to the patient's satisfaction.     Nash Bolls A

## 2011-12-14 NOTE — Progress Notes (Signed)
[employer] rep into see pt. Family w pt.

## 2011-12-14 NOTE — H&P (View-Only) (Signed)
History of Present Illness   [name] has interstitial cystitis with minimal pain. Her frequency and urgency and nocturia are affecting her quality of life. She thinks she is about 20% better with a PNE a little better on the right than the left.   Her options are becoming limited and she really does not want to do PTNS.  She would like to do the stage 1 permanent implant and I think under the circumstances is very reasonable. I am going to arrange this and we will proceed accordingly. Review of systems: No change in bowel or neurologic status.   Incision look fine and the device was removed.    Past Medical History Problems  1. History of  Arthritis V13.4 2. History of  Asthma 493.90 3. History of  Depression 311 4. History of  Fibromyalgia 729.1 5. History of  Gastric Ulcer 531.90 6. History of  Heart Disease 429.9 7. History of  Heartburn 787.1 8. History of  Hypercholesterolemia 272.0 9. History of  Hypertension 401.9 10. History of  Lower Back Pain 724.2 11. History of  Murmurs 785.2  Surgical History Problems  1. History of  Back Surgery 2. History of  Bladder Irrigation 3. History of  Cholecystectomy 4. History of  Cystoscopy With Biopsy 5. History of  Cystoscopy With Dilation Of Bladder 6. History of  Neck Surgery 7. History of  Neuroplasty Right Thumb 8. History of  Tonsillectomy With Adenoidectomy  Current Meds 1. Albuterol AERS; Therapy: (Recorded:23May2012) to 2. AmLODIPine Besylate 5 MG Oral Tablet; Therapy: (Recorded:19Mar2009) to 3. Aspirin 325 MG Oral Tablet; Therapy: (Recorded:23May2012) to 4. Budeprion SR 150 MG Oral Tablet Extended Release 12 Hour; Therapy: (Recorded:19Mar2009)  to 5. Calcium 1200 1200-1000 MG-UNIT Oral Tablet Chewable; Therapy: (Recorded:23May2012) to 6. Carisoprodol 350 MG Oral Tablet; Therapy: (Recorded:19Mar2009) to 7. Cephalexin 500 MG Oral Capsule; TAKE 500 MG 3 times daily take 30 mins prior to test and then  resume on a  regular schedule; Therapy: 29Aug2012 to (Last Rx:29Aug2012)  Requested for:  29Aug2012 8. Cymbalta 60 MG Oral Capsule Delayed Release Particles; Therapy: 03Jan2012 to 9. Diazepam 5 MG Oral Tablet; Therapy: (Recorded:19Mar2009) to 10. Flovent HFA 44 MCG/ACT Inhalation Aerosol; Therapy: 04May2012 to 11. Hydrocodone-Acetaminophen 5-325 MG Oral Tablet; Therapy: 04Jan2012 to 12. Hydrocodone-Acetaminophen 5-500 MG Oral Tablet; Therapy: 21Jun2012 to 13. Iron 325 (65 Fe) MG Oral Tablet; Therapy: (Recorded:19Mar2009) to 14. Lovaza 1 GM Oral Capsule; Therapy: (Recorded:23May2012) to 15. Magnesium TABS; Therapy: (Recorded:23May2012) to 16. Metoprolol Tartrate 25 MG Oral Tablet; Therapy: 29Feb2012 to 17. NexIUM 40 MG Oral Capsule Delayed Release; Therapy: (Recorded:19Mar2009) to 18. ProAir HFA AERS; Therapy: (Recorded:08Aug2012) to 19. Tekturna 150 MG Oral Tablet; Therapy: 08Feb2012 to 20. TraMADol HCl 50 MG Oral Tablet; Therapy: 04Jan2012 to 21. TraZODone HCl 50 MG Oral Tablet; Therapy: 01Feb2012 to 22. Vitamin C 500 MG Oral Tablet; Therapy: (Recorded:19Mar2009) to 23. Vitamin D3 1000 UNIT Oral Tablet; Therapy: (Recorded:23May2012) to  Allergies Medication  1. PredniSONE TABS 2. Sulfa Drugs 3. ACE Inhibitors 4. Cipro TABS 5. Versed SOLN  Family History Problems  1. Maternal history of  Breast Cancer V16.3 2. Maternal history of  Heart Disease V17.49 mother had a pacemaker placed 3. Paternal history of  Hematuria 4. Paternal history of  Nephrolithiasis 5. Sororal history of  Nephrolithiasis 6. Paternal history of  Pneumonia 7. Paternal history of  Renal Failure  Social History Problems  1. Caffeine Use 1-2 a day 2. Marital History - Widowed 3. Occupation: disabled 4. Tobacco Use V15.82 1-2   packs a day for 30 years Denied  5. History of  Alcohol Use  Assessment Assessed  1. Urge Incontinence Of Urine 788.31 2. Urinary Frequency 788.41  Plan Urge Incontinence Of Urine  (788.31)  1. Follow-up Schedule Surgery Office  Follow-up  Done: 22Oct2012  Discussion/Summary  Place interstim  After a thorough review of the management options for the patient's condition the patient  elected to proceed with surgical therapy as noted above. We have discussed the potential benefits and risks of the procedure, side effects of the proposed treatment, the likelihood of the patient achieving the goals of the procedure, and any potential problems that might occur during the procedure or recuperation. Informed consent has been obtained.  

## 2011-12-14 NOTE — Anesthesia Postprocedure Evaluation (Signed)
  Anesthesia Post-op Note  Patient: [name]  Procedure(s) Performed:  Leane Platt IMPLANT SECOND STAGE - rad tech ok by vickie at main  Patient Location: PACU  Anesthesia Type: MAC  Level of Consciousness: awake and alert   Airway and Oxygen Therapy: Patient Spontanous Breathing  Post-op Pain: mild  Post-op Assessment: Post-op Vital signs reviewed, Patient's Cardiovascular Status Stable, Respiratory Function Stable, Patent Airway and No signs of Nausea or vomiting  Post-op Vital Signs: stable  Complications: No apparent anesthesia complications

## 2011-12-14 NOTE — Op Note (Signed)
Preoperative diagnosis: Refractory urge incontinence Post operative diagnosis: Refractory urge incontinence Surgery: Placement of InterStim stage II Surgeon Dr. Alfredo Martinez  The patient had a successful stage I implant of the InterStim device. She didn't excellent response in regards to her frequency and nocturia  The patient was prepped and draped in the usual fashion. The exiting wire was draped away. A hemostat was applied to it.t I opened the incision after instilling 20 cc of a lidocaine and epinephrine mixture. The incision was lengthened to 4-1/2 cm  I delivered the lead with the connecting wire appearance I remove the silk sutures and boots. A used to screwdriver to disassemble the device and our assistant pulled the wire away from the patient. I attached the generator. I developed an appropriate size and depth pocket.  As a separate procedure the impedance was checked and was normal in all 4 leads  Irrigation was applied. A closed incision with 3-0 Vicryl followed by 4-0 Vicryl followed by a sterile dressing.

## 2011-12-14 NOTE — Progress Notes (Signed)
[employer] rep in to program device

## 2011-12-14 NOTE — Anesthesia Procedure Notes (Signed)
Procedure Name: MAC Performed by: Lorrin Jackson Pre-anesthesia Checklist: Patient identified, Patient being monitored, Emergency Drugs available, Timeout performed and Suction available Patient Re-evaluated:Patient Re-evaluated prior to inductionOxygen Delivery Method: Simple face mask Intubation Type: IV induction

## 2011-12-14 NOTE — Progress Notes (Signed)
Dr MacDiarmid paged to see pt prior to D/C 

## 2011-12-15 ENCOUNTER — Encounter (HOSPITAL_BASED_OUTPATIENT_CLINIC_OR_DEPARTMENT_OTHER): Payer: Self-pay | Admitting: Urology

## 2011-12-15 NOTE — Addendum Note (Signed)
Addendum  created 12/15/11 1026 by Gaetano Hawthorne, MD   Modules edited:Anesthesia Responsible Staff

## 2011-12-15 NOTE — Addendum Note (Signed)
Addendum  created 12/15/11 1026 by Jennilyn Esteve L Sharmarke Cicio, MD   Modules edited:Anesthesia Responsible Staff    

## 2011-12-25 DIAGNOSIS — N3941 Urge incontinence: Secondary | ICD-10-CM | POA: Diagnosis not present

## 2012-01-23 DIAGNOSIS — R350 Frequency of micturition: Secondary | ICD-10-CM | POA: Diagnosis not present

## 2012-01-25 DIAGNOSIS — M79609 Pain in unspecified limb: Secondary | ICD-10-CM | POA: Diagnosis not present

## 2012-01-30 DIAGNOSIS — S52599A Other fractures of lower end of unspecified radius, initial encounter for closed fracture: Secondary | ICD-10-CM | POA: Diagnosis not present

## 2012-03-01 DIAGNOSIS — N3941 Urge incontinence: Secondary | ICD-10-CM | POA: Diagnosis not present

## 2012-03-01 DIAGNOSIS — R350 Frequency of micturition: Secondary | ICD-10-CM | POA: Diagnosis not present

## 2012-03-11 DIAGNOSIS — F411 Generalized anxiety disorder: Secondary | ICD-10-CM | POA: Diagnosis not present

## 2012-03-11 DIAGNOSIS — E669 Obesity, unspecified: Secondary | ICD-10-CM | POA: Diagnosis not present

## 2012-03-11 DIAGNOSIS — I10 Essential (primary) hypertension: Secondary | ICD-10-CM | POA: Diagnosis not present

## 2012-03-11 DIAGNOSIS — J069 Acute upper respiratory infection, unspecified: Secondary | ICD-10-CM | POA: Diagnosis not present

## 2012-03-26 DIAGNOSIS — S52599A Other fractures of lower end of unspecified radius, initial encounter for closed fracture: Secondary | ICD-10-CM | POA: Diagnosis not present

## 2012-03-29 DIAGNOSIS — Z1231 Encounter for screening mammogram for malignant neoplasm of breast: Secondary | ICD-10-CM | POA: Diagnosis not present

## 2012-04-18 DIAGNOSIS — S52599A Other fractures of lower end of unspecified radius, initial encounter for closed fracture: Secondary | ICD-10-CM | POA: Diagnosis not present

## 2012-04-24 ENCOUNTER — Other Ambulatory Visit: Payer: Self-pay | Admitting: Family Medicine

## 2012-04-24 ENCOUNTER — Other Ambulatory Visit (HOSPITAL_COMMUNITY)
Admission: RE | Admit: 2012-04-24 | Discharge: 2012-04-24 | Disposition: A | Discharge status: Home / Self Care | Payer: Medicare Other | Source: Ambulatory Visit | Attending: Family Medicine | Admitting: Family Medicine

## 2012-04-24 DIAGNOSIS — F329 Major depressive disorder, single episode, unspecified: Secondary | ICD-10-CM | POA: Diagnosis not present

## 2012-04-24 DIAGNOSIS — I2510 Atherosclerotic heart disease of native coronary artery without angina pectoris: Secondary | ICD-10-CM | POA: Diagnosis not present

## 2012-04-24 DIAGNOSIS — Z124 Encounter for screening for malignant neoplasm of cervix: Secondary | ICD-10-CM | POA: Diagnosis not present

## 2012-04-24 DIAGNOSIS — F411 Generalized anxiety disorder: Secondary | ICD-10-CM | POA: Diagnosis not present

## 2012-04-24 DIAGNOSIS — Z1211 Encounter for screening for malignant neoplasm of colon: Secondary | ICD-10-CM | POA: Diagnosis not present

## 2012-04-24 DIAGNOSIS — E782 Mixed hyperlipidemia: Secondary | ICD-10-CM | POA: Diagnosis not present

## 2012-04-24 DIAGNOSIS — I10 Essential (primary) hypertension: Secondary | ICD-10-CM | POA: Diagnosis not present

## 2012-04-24 DIAGNOSIS — Z0000 Encounter for general adult medical examination without abnormal findings: Secondary | ICD-10-CM | POA: Diagnosis not present

## 2012-04-24 DIAGNOSIS — Z79899 Other long term (current) drug therapy: Secondary | ICD-10-CM | POA: Diagnosis not present

## 2012-04-25 DIAGNOSIS — R350 Frequency of micturition: Secondary | ICD-10-CM | POA: Diagnosis not present

## 2012-04-25 DIAGNOSIS — N3941 Urge incontinence: Secondary | ICD-10-CM | POA: Diagnosis not present

## 2012-05-08 DIAGNOSIS — M659 Synovitis and tenosynovitis, unspecified: Secondary | ICD-10-CM | POA: Diagnosis not present

## 2012-05-08 DIAGNOSIS — M24139 Other articular cartilage disorders, unspecified wrist: Secondary | ICD-10-CM | POA: Diagnosis not present

## 2012-05-08 DIAGNOSIS — IMO0002 Reserved for concepts with insufficient information to code with codable children: Secondary | ICD-10-CM | POA: Diagnosis not present

## 2012-05-21 DIAGNOSIS — S63509A Unspecified sprain of unspecified wrist, initial encounter: Secondary | ICD-10-CM | POA: Diagnosis not present

## 2012-06-07 DIAGNOSIS — S63509A Unspecified sprain of unspecified wrist, initial encounter: Secondary | ICD-10-CM | POA: Diagnosis not present

## 2012-06-24 DIAGNOSIS — E669 Obesity, unspecified: Secondary | ICD-10-CM | POA: Diagnosis not present

## 2012-06-24 DIAGNOSIS — J45909 Unspecified asthma, uncomplicated: Secondary | ICD-10-CM | POA: Diagnosis not present

## 2012-06-24 DIAGNOSIS — F411 Generalized anxiety disorder: Secondary | ICD-10-CM | POA: Diagnosis not present

## 2012-06-24 DIAGNOSIS — I10 Essential (primary) hypertension: Secondary | ICD-10-CM | POA: Diagnosis not present

## 2012-07-02 DIAGNOSIS — S63509A Unspecified sprain of unspecified wrist, initial encounter: Secondary | ICD-10-CM | POA: Diagnosis not present

## 2012-07-04 DIAGNOSIS — M545 Low back pain: Secondary | ICD-10-CM | POA: Diagnosis not present

## 2012-07-10 DIAGNOSIS — R112 Nausea with vomiting, unspecified: Secondary | ICD-10-CM | POA: Diagnosis not present

## 2012-07-10 DIAGNOSIS — R197 Diarrhea, unspecified: Secondary | ICD-10-CM | POA: Diagnosis not present

## 2012-07-12 DIAGNOSIS — M545 Low back pain: Secondary | ICD-10-CM | POA: Diagnosis not present

## 2012-07-16 DIAGNOSIS — M545 Low back pain: Secondary | ICD-10-CM | POA: Diagnosis not present

## 2012-07-23 DIAGNOSIS — S63599A Other specified sprain of unspecified wrist, initial encounter: Secondary | ICD-10-CM | POA: Diagnosis not present

## 2012-07-30 DIAGNOSIS — M961 Postlaminectomy syndrome, not elsewhere classified: Secondary | ICD-10-CM | POA: Diagnosis not present

## 2012-07-30 DIAGNOSIS — M545 Low back pain: Secondary | ICD-10-CM | POA: Diagnosis not present

## 2012-08-09 DIAGNOSIS — S63509A Unspecified sprain of unspecified wrist, initial encounter: Secondary | ICD-10-CM | POA: Diagnosis not present

## 2012-08-14 DIAGNOSIS — M47817 Spondylosis without myelopathy or radiculopathy, lumbosacral region: Secondary | ICD-10-CM | POA: Diagnosis not present

## 2012-08-16 DIAGNOSIS — M47817 Spondylosis without myelopathy or radiculopathy, lumbosacral region: Secondary | ICD-10-CM | POA: Diagnosis not present

## 2012-08-23 DIAGNOSIS — M47817 Spondylosis without myelopathy or radiculopathy, lumbosacral region: Secondary | ICD-10-CM | POA: Diagnosis not present

## 2012-08-26 DIAGNOSIS — S63509A Unspecified sprain of unspecified wrist, initial encounter: Secondary | ICD-10-CM | POA: Diagnosis not present

## 2012-08-27 DIAGNOSIS — S63509A Unspecified sprain of unspecified wrist, initial encounter: Secondary | ICD-10-CM | POA: Diagnosis not present

## 2012-08-28 DIAGNOSIS — S63509A Unspecified sprain of unspecified wrist, initial encounter: Secondary | ICD-10-CM | POA: Diagnosis not present

## 2012-09-03 DIAGNOSIS — S63509A Unspecified sprain of unspecified wrist, initial encounter: Secondary | ICD-10-CM | POA: Diagnosis not present

## 2012-09-10 DIAGNOSIS — S63509A Unspecified sprain of unspecified wrist, initial encounter: Secondary | ICD-10-CM | POA: Diagnosis not present

## 2012-09-17 DIAGNOSIS — S63509A Unspecified sprain of unspecified wrist, initial encounter: Secondary | ICD-10-CM | POA: Diagnosis not present

## 2012-09-21 DIAGNOSIS — J019 Acute sinusitis, unspecified: Secondary | ICD-10-CM | POA: Diagnosis not present

## 2012-09-24 DIAGNOSIS — S63509A Unspecified sprain of unspecified wrist, initial encounter: Secondary | ICD-10-CM | POA: Diagnosis not present

## 2012-10-01 DIAGNOSIS — S63509A Unspecified sprain of unspecified wrist, initial encounter: Secondary | ICD-10-CM | POA: Diagnosis not present

## 2012-10-07 DIAGNOSIS — S63509A Unspecified sprain of unspecified wrist, initial encounter: Secondary | ICD-10-CM | POA: Diagnosis not present

## 2012-10-24 DIAGNOSIS — R079 Chest pain, unspecified: Secondary | ICD-10-CM | POA: Diagnosis not present

## 2012-10-24 DIAGNOSIS — J45909 Unspecified asthma, uncomplicated: Secondary | ICD-10-CM | POA: Diagnosis not present

## 2012-10-24 DIAGNOSIS — E669 Obesity, unspecified: Secondary | ICD-10-CM | POA: Diagnosis not present

## 2012-10-24 DIAGNOSIS — I10 Essential (primary) hypertension: Secondary | ICD-10-CM | POA: Diagnosis not present

## 2012-10-25 DIAGNOSIS — J019 Acute sinusitis, unspecified: Secondary | ICD-10-CM | POA: Diagnosis not present

## 2012-10-25 DIAGNOSIS — R7309 Other abnormal glucose: Secondary | ICD-10-CM | POA: Diagnosis not present

## 2012-10-25 DIAGNOSIS — Z23 Encounter for immunization: Secondary | ICD-10-CM | POA: Diagnosis not present

## 2012-10-25 DIAGNOSIS — J441 Chronic obstructive pulmonary disease with (acute) exacerbation: Secondary | ICD-10-CM | POA: Diagnosis not present

## 2012-10-25 DIAGNOSIS — F17200 Nicotine dependence, unspecified, uncomplicated: Secondary | ICD-10-CM | POA: Diagnosis not present

## 2012-10-25 DIAGNOSIS — I10 Essential (primary) hypertension: Secondary | ICD-10-CM | POA: Diagnosis not present

## 2012-10-25 DIAGNOSIS — R51 Headache: Secondary | ICD-10-CM | POA: Diagnosis not present

## 2012-10-25 DIAGNOSIS — E782 Mixed hyperlipidemia: Secondary | ICD-10-CM | POA: Diagnosis not present

## 2012-12-06 DIAGNOSIS — R7989 Other specified abnormal findings of blood chemistry: Secondary | ICD-10-CM | POA: Diagnosis not present

## 2012-12-06 DIAGNOSIS — R0602 Shortness of breath: Secondary | ICD-10-CM | POA: Diagnosis not present

## 2012-12-06 DIAGNOSIS — F17200 Nicotine dependence, unspecified, uncomplicated: Secondary | ICD-10-CM | POA: Diagnosis not present

## 2012-12-06 DIAGNOSIS — R7309 Other abnormal glucose: Secondary | ICD-10-CM | POA: Diagnosis not present

## 2012-12-27 ENCOUNTER — Ambulatory Visit (INDEPENDENT_AMBULATORY_CARE_PROVIDER_SITE_OTHER): Payer: Medicare Other | Admitting: Internal Medicine

## 2012-12-27 DIAGNOSIS — R0602 Shortness of breath: Secondary | ICD-10-CM | POA: Diagnosis not present

## 2012-12-27 LAB — PULMONARY FUNCTION TEST

## 2012-12-27 NOTE — Progress Notes (Signed)
PFT done today. 

## 2012-12-31 DIAGNOSIS — M25519 Pain in unspecified shoulder: Secondary | ICD-10-CM | POA: Diagnosis not present

## 2013-01-14 ENCOUNTER — Other Ambulatory Visit: Payer: Self-pay | Admitting: Family Medicine

## 2013-01-14 DIAGNOSIS — R0600 Dyspnea, unspecified: Secondary | ICD-10-CM

## 2013-01-22 DIAGNOSIS — I2510 Atherosclerotic heart disease of native coronary artery without angina pectoris: Secondary | ICD-10-CM | POA: Diagnosis not present

## 2013-02-07 DIAGNOSIS — E782 Mixed hyperlipidemia: Secondary | ICD-10-CM | POA: Diagnosis not present

## 2013-02-07 DIAGNOSIS — J449 Chronic obstructive pulmonary disease, unspecified: Secondary | ICD-10-CM | POA: Diagnosis not present

## 2013-02-07 DIAGNOSIS — F17200 Nicotine dependence, unspecified, uncomplicated: Secondary | ICD-10-CM | POA: Diagnosis not present

## 2013-02-28 ENCOUNTER — Other Ambulatory Visit (HOSPITAL_COMMUNITY): Payer: Self-pay | Admitting: Cardiovascular Disease

## 2013-02-28 DIAGNOSIS — R079 Chest pain, unspecified: Secondary | ICD-10-CM

## 2013-03-06 ENCOUNTER — Encounter (HOSPITAL_COMMUNITY)
Admission: RE | Admit: 2013-03-06 | Discharge: 2013-03-06 | Disposition: A | Discharge status: Home / Self Care | Payer: Medicare Other | Source: Ambulatory Visit | Attending: Cardiovascular Disease | Admitting: Cardiovascular Disease

## 2013-03-06 ENCOUNTER — Other Ambulatory Visit: Payer: Self-pay

## 2013-03-06 DIAGNOSIS — R079 Chest pain, unspecified: Secondary | ICD-10-CM | POA: Diagnosis not present

## 2013-03-06 DIAGNOSIS — I2510 Atherosclerotic heart disease of native coronary artery without angina pectoris: Secondary | ICD-10-CM | POA: Diagnosis not present

## 2013-03-06 DIAGNOSIS — I10 Essential (primary) hypertension: Secondary | ICD-10-CM | POA: Diagnosis not present

## 2013-03-06 MED ORDER — TECHNETIUM TC 99M SESTAMIBI - CARDIOLITE
30.0000 | Freq: Once | INTRAVENOUS | Status: AC | PRN
  Administered 2013-03-06: 10:00:00 30 via INTRAVENOUS

## 2013-03-06 MED ORDER — REGADENOSON 0.4 MG/5ML IV SOLN
0.4000 mg | Freq: Once | INTRAVENOUS | Status: AC
  Administered 2013-03-06: 0.4 mg via INTRAVENOUS

## 2013-03-06 MED ORDER — TECHNETIUM TC 99M SESTAMIBI - CARDIOLITE
10.0000 | Freq: Once | INTRAVENOUS | Status: AC | PRN
  Administered 2013-03-06: 09:00:00 10 via INTRAVENOUS

## 2013-03-06 MED ORDER — REGADENOSON 0.4 MG/5ML IV SOLN
INTRAVENOUS | Status: AC
  Filled 2013-03-06: qty 5

## 2013-03-19 ENCOUNTER — Emergency Department (HOSPITAL_COMMUNITY): Payer: Medicare Other

## 2013-03-19 ENCOUNTER — Encounter (HOSPITAL_COMMUNITY): Payer: Self-pay | Admitting: *Deleted

## 2013-03-19 ENCOUNTER — Emergency Department (HOSPITAL_COMMUNITY)
Admission: EM | Admit: 2013-03-19 | Discharge: 2013-03-19 | Disposition: A | Discharge status: Home / Self Care | Payer: Medicare Other | Attending: Emergency Medicine | Admitting: Emergency Medicine

## 2013-03-19 DIAGNOSIS — Z79899 Other long term (current) drug therapy: Secondary | ICD-10-CM | POA: Diagnosis not present

## 2013-03-19 DIAGNOSIS — Z8679 Personal history of other diseases of the circulatory system: Secondary | ICD-10-CM | POA: Diagnosis not present

## 2013-03-19 DIAGNOSIS — Z87448 Personal history of other diseases of urinary system: Secondary | ICD-10-CM | POA: Insufficient documentation

## 2013-03-19 DIAGNOSIS — W64XXXA Exposure to other animate mechanical forces, initial encounter: Secondary | ICD-10-CM | POA: Insufficient documentation

## 2013-03-19 DIAGNOSIS — F329 Major depressive disorder, single episode, unspecified: Secondary | ICD-10-CM | POA: Insufficient documentation

## 2013-03-19 DIAGNOSIS — Z862 Personal history of diseases of the blood and blood-forming organs and certain disorders involving the immune mechanism: Secondary | ICD-10-CM | POA: Diagnosis not present

## 2013-03-19 DIAGNOSIS — Y9289 Other specified places as the place of occurrence of the external cause: Secondary | ICD-10-CM | POA: Insufficient documentation

## 2013-03-19 DIAGNOSIS — F411 Generalized anxiety disorder: Secondary | ICD-10-CM | POA: Diagnosis not present

## 2013-03-19 DIAGNOSIS — I10 Essential (primary) hypertension: Secondary | ICD-10-CM | POA: Insufficient documentation

## 2013-03-19 DIAGNOSIS — Z8719 Personal history of other diseases of the digestive system: Secondary | ICD-10-CM | POA: Insufficient documentation

## 2013-03-19 DIAGNOSIS — Z8711 Personal history of peptic ulcer disease: Secondary | ICD-10-CM | POA: Insufficient documentation

## 2013-03-19 DIAGNOSIS — K219 Gastro-esophageal reflux disease without esophagitis: Secondary | ICD-10-CM | POA: Diagnosis not present

## 2013-03-19 DIAGNOSIS — S060X9A Concussion with loss of consciousness of unspecified duration, initial encounter: Secondary | ICD-10-CM | POA: Insufficient documentation

## 2013-03-19 DIAGNOSIS — Z8659 Personal history of other mental and behavioral disorders: Secondary | ICD-10-CM | POA: Insufficient documentation

## 2013-03-19 DIAGNOSIS — Y939 Activity, unspecified: Secondary | ICD-10-CM | POA: Insufficient documentation

## 2013-03-19 DIAGNOSIS — J45909 Unspecified asthma, uncomplicated: Secondary | ICD-10-CM | POA: Diagnosis not present

## 2013-03-19 DIAGNOSIS — F17200 Nicotine dependence, unspecified, uncomplicated: Secondary | ICD-10-CM | POA: Insufficient documentation

## 2013-03-19 DIAGNOSIS — F3289 Other specified depressive episodes: Secondary | ICD-10-CM | POA: Insufficient documentation

## 2013-03-19 DIAGNOSIS — S0990XA Unspecified injury of head, initial encounter: Secondary | ICD-10-CM | POA: Diagnosis not present

## 2013-03-19 DIAGNOSIS — Z8739 Personal history of other diseases of the musculoskeletal system and connective tissue: Secondary | ICD-10-CM | POA: Insufficient documentation

## 2013-03-19 DIAGNOSIS — Z7982 Long term (current) use of aspirin: Secondary | ICD-10-CM | POA: Diagnosis not present

## 2013-03-19 DIAGNOSIS — I2510 Atherosclerotic heart disease of native coronary artery without angina pectoris: Secondary | ICD-10-CM | POA: Insufficient documentation

## 2013-03-19 NOTE — ED Notes (Signed)
Pt [place] she [place] the barn when she was helping with a hoarse and it charged at her and knocked her over. Pt reports she hit her head on the concrete. Pt reports she think she lost consciousness for just a second and then she started yelling for help. Pt denies any injury/trauma to head. Pt reports she feels a slight HA and thinks she is more tired than normal. Pt has swelling to left posterior head. Pt sts she put ice on it and the swelling has decreased, pt also sts she took some ibuprofen.

## 2013-03-19 NOTE — ED Notes (Signed)
Pt reports being knocked over by a horse, the horse did not land or step on her. Unsure about loc, hit left side of head on concrete. Pt having left head pain and neck pain. Ambulatory at triage, a&ox4.

## 2013-03-19 NOTE — ED Provider Notes (Signed)
History     CSN: [id number]  Arrival date & time 03/19/13  1322   First MD Initiated Contact with Patient 03/19/13 1356      Chief Complaint  Patient presents with  . Head Injury    (Consider location/radiation/quality/duration/timing/severity/associated sxs/prior treatment) Patient is a 57 y.o. female presenting with head injury.  Head Injury  Pt reports she was knocked over by a horse earlier this morning, falling backwards onto a concrete floor. She thinks she had a brief LOC, has had moderate headache and dizziness since then. Denies any vomiting.   Past Medical History  Diagnosis Date  . Hypertension   . Coronary artery disease CARDIOLOGIST- DR  Algie Coffer  (VISIT 03-30-11 W/ CHART)    NON-OBS. CAD   (STRESS TEST NOV. 2011  . Asthma   . Non-productive cough   . Numbness in both hands AT TIMES  . DJD (degenerative joint disease)     JOINT PAIN  . DDD (degenerative disc disease)   . Fibromyalgia   . Depression   . Anxiety   . GERD (gastroesophageal reflux disease) AND HIATIAL HERNIA    CONTROLLED W/ NEXIUM  . History of gastric ulcer 2004  . IBS (irritable bowel syndrome)   . Hemorrhoids   . Urge urinary incontinence   . Frequency of urination   . Nocturia   . Iron deficiency anemia   . Agoraphobia without history of panic disorder   . Insomnia   . Incisional pain s/p interstim implant 1st stage--- 12-07-11     left upper buttock-- pt states dressing clean dry and intact (on 12-08-11)    Past Surgical History  Procedure Laterality Date  . Cardiac catheterization  2003  . Cysto/ hod/ bladder bx/ fulgeration  06-12-2011  . Cervical fusion  2008    C4 - T1  . Laminectomy  2007    L3 - 5  . Cysto/ hod/ bladder bx  2006  . Hysteroscopy w/d&c  2005  . Cervical discectomy  2002    C4 - 7  . Right thumb surg.  2001  . Cholecystectomy  1982  . Tonsillectomy and adenoidectomy  1965  . Interstim implant placement  12/07/2011    Procedure: INTERSTIM IMPLANT FIRST  STAGE;  Surgeon: Martina Sinner, MD;  Location: [facility];  Service: Urology;  Laterality: Right;  . Interstim implant placement  12/14/2011    Procedure: INTERSTIM IMPLANT SECOND STAGE;  Surgeon: Martina Sinner, MD;  Location: [facility];  Service: Urology;  Laterality: Right;  rad tech ok by vickie at main    History reviewed. No pertinent family history.  History  Substance Use Topics  . Smoking status: Current Every Day Smoker -- 1.00 packs/day for 30 years  . Smokeless tobacco: Never Used  . Alcohol Use: Yes     Comment: OCCASIONAL    OB History   Grav Para Term Preterm Abortions TAB SAB Ect Mult Living                  Review of Systems All other systems reviewed and are negative except as noted in HPI.   Allergies  Statins; Ace inhibitors; Amoxicillin; Midazolam hcl; Nsaids; Prednisone; and Sulfa antibiotics  Home Medications   Current Outpatient Rx  Name  Route  Sig  Dispense  Refill  . albuterol (PROVENTIL HFA;VENTOLIN HFA) 108 (90 BASE) MCG/ACT inhaler   Inhalation   Inhale 2 puffs into the lungs every 6 (six) hours as needed  for wheezing or shortness of breath.         Marland Kitchen amLODipine (NORVASC) 5 MG tablet   Oral   Take 5 mg by mouth every morning.           Marland Kitchen aspirin EC 325 MG tablet   Oral   Take 325 mg by mouth daily.         Marland Kitchen buPROPion (WELLBUTRIN XL) 150 MG 24 hr tablet   Oral   Take 150 mg by mouth daily.           . Cyanocobalamin (B-12 SL)   Sublingual   Place 1 tablet under the tongue daily.           . diazepam (VALIUM) 5 MG tablet   Oral   Take 2.5 mg by mouth 2 (two) times daily.          . DULoxetine (CYMBALTA) 60 MG capsule   Oral   Take 60 mg by mouth daily.           Marland Kitchen esomeprazole (NEXIUM) 40 MG capsule   Oral   Take 40 mg by mouth daily before breakfast.           . HYDROcodone-acetaminophen (NORCO) 5-325 MG per tablet   Oral   Take 1 tablet by mouth 2 (two) times daily  as needed for pain.          Marland Kitchen ibuprofen (ADVIL,MOTRIN) 200 MG tablet   Oral   Take 600 mg by mouth every 6 (six) hours as needed for pain.         . methocarbamol (ROBAXIN) 500 MG tablet   Oral   Take 500 mg by mouth 3 (three) times daily as needed (for muscle spasms).         . metoprolol tartrate (LOPRESSOR) 25 MG tablet   Oral   Take 25 mg by mouth 2 (two) times daily.          . traZODone (DESYREL) 50 MG tablet   Oral   Take 50 mg by mouth at bedtime.           . vitamin C (ASCORBIC ACID) 500 MG tablet   Oral   Take 500 mg by mouth daily.             BP 129/65  Pulse 73  Temp(Src) 97.5 F (36.4 C) (Oral)  Resp 16  SpO2 97%  LMP 09/06/2005  Physical Exam  Nursing note and vitals reviewed. Constitutional: She is oriented to person, place, and time. She appears well-developed and well-nourished.  HENT:  Head: Normocephalic.  Occipital/parietal hematoma, no laceration  Eyes: EOM are normal. Pupils are equal, round, and reactive to light.  Neck: Normal range of motion. Neck supple.  Cardiovascular: Normal rate, normal heart sounds and intact distal pulses.   Pulmonary/Chest: Effort normal and breath sounds normal.  Abdominal: Bowel sounds are normal. She exhibits no distension. There is no tenderness.  Musculoskeletal: Normal range of motion. She exhibits no edema and no tenderness.  Neurological: She is alert and oriented to person, place, and time. She has normal strength. No cranial nerve deficit or sensory deficit.  Skin: Skin is warm and dry. No rash noted.  Psychiatric: She has a normal mood and affect.    ED Course  Procedures (including critical care time)  Labs Reviewed - No data to display Ct Head Wo Contrast  03/19/2013  *RADIOLOGY REPORT*  Clinical Data: Head injury with brief loss of consciousness.  CT HEAD WITHOUT CONTRAST  Technique:  Contiguous axial images were obtained from the base of the skull through the vertex without contrast.   Comparison: None.  Findings: The brain shows generalized atrophy.  There are chronic small vessel changes of the deep white matter.  No sign of acute infarction, mass lesion, hemorrhage, hydrocephalus or extra-axial collection.  No skull fracture.  No fluid in the sinuses, middle ears or mastoids.  IMPRESSION: No acute or traumatic finding.  Mild atrophy and chronic small vessel change of the white matter.   Original Report Authenticated By: Paulina Fusi, M.D.      1. Concussion, with loss of consciousness of unspecified duration, initial encounter       MDM  CT neg as above. Pt given head injury precautions, suspect she has had a mild concussion. Advised to return for worsening.         Eva Vallee B. Bernette Mayers, MD 03/19/13 1520

## 2013-03-25 DIAGNOSIS — M549 Dorsalgia, unspecified: Secondary | ICD-10-CM | POA: Diagnosis not present

## 2013-03-25 DIAGNOSIS — S060X9A Concussion with loss of consciousness of unspecified duration, initial encounter: Secondary | ICD-10-CM | POA: Diagnosis not present

## 2013-03-26 DIAGNOSIS — J45909 Unspecified asthma, uncomplicated: Secondary | ICD-10-CM | POA: Diagnosis not present

## 2013-03-26 DIAGNOSIS — M549 Dorsalgia, unspecified: Secondary | ICD-10-CM | POA: Diagnosis not present

## 2013-03-26 DIAGNOSIS — M25519 Pain in unspecified shoulder: Secondary | ICD-10-CM | POA: Diagnosis not present

## 2013-03-26 DIAGNOSIS — I10 Essential (primary) hypertension: Secondary | ICD-10-CM | POA: Diagnosis not present

## 2013-03-27 ENCOUNTER — Other Ambulatory Visit: Payer: Self-pay | Admitting: Cardiovascular Disease

## 2013-03-27 ENCOUNTER — Ambulatory Visit
Admission: RE | Admit: 2013-03-27 | Discharge: 2013-03-27 | Disposition: A | Discharge status: Home / Self Care | Payer: Medicare Other | Source: Ambulatory Visit | Attending: Cardiovascular Disease | Admitting: Cardiovascular Disease

## 2013-03-27 DIAGNOSIS — R0602 Shortness of breath: Secondary | ICD-10-CM

## 2013-03-27 DIAGNOSIS — J449 Chronic obstructive pulmonary disease, unspecified: Secondary | ICD-10-CM

## 2013-03-27 DIAGNOSIS — R079 Chest pain, unspecified: Secondary | ICD-10-CM | POA: Diagnosis not present

## 2013-03-31 DIAGNOSIS — Z1231 Encounter for screening mammogram for malignant neoplasm of breast: Secondary | ICD-10-CM | POA: Diagnosis not present

## 2013-04-08 DIAGNOSIS — F17200 Nicotine dependence, unspecified, uncomplicated: Secondary | ICD-10-CM | POA: Diagnosis not present

## 2013-04-08 DIAGNOSIS — J449 Chronic obstructive pulmonary disease, unspecified: Secondary | ICD-10-CM | POA: Diagnosis not present

## 2013-04-21 DIAGNOSIS — M545 Low back pain: Secondary | ICD-10-CM | POA: Diagnosis not present

## 2013-05-02 DIAGNOSIS — R141 Gas pain: Secondary | ICD-10-CM | POA: Diagnosis not present

## 2013-05-02 DIAGNOSIS — R143 Flatulence: Secondary | ICD-10-CM | POA: Diagnosis not present

## 2013-05-02 DIAGNOSIS — R1310 Dysphagia, unspecified: Secondary | ICD-10-CM | POA: Diagnosis not present

## 2013-05-02 DIAGNOSIS — R197 Diarrhea, unspecified: Secondary | ICD-10-CM | POA: Diagnosis not present

## 2013-05-21 ENCOUNTER — Other Ambulatory Visit: Payer: Self-pay | Admitting: Gastroenterology

## 2013-05-21 DIAGNOSIS — K2970 Gastritis, unspecified, without bleeding: Secondary | ICD-10-CM | POA: Diagnosis not present

## 2013-05-21 DIAGNOSIS — D131 Benign neoplasm of stomach: Secondary | ICD-10-CM | POA: Diagnosis not present

## 2013-05-21 DIAGNOSIS — K2990 Gastroduodenitis, unspecified, without bleeding: Secondary | ICD-10-CM | POA: Diagnosis not present

## 2013-05-21 DIAGNOSIS — R1013 Epigastric pain: Secondary | ICD-10-CM | POA: Diagnosis not present

## 2013-05-21 DIAGNOSIS — Z1211 Encounter for screening for malignant neoplasm of colon: Secondary | ICD-10-CM | POA: Diagnosis not present

## 2013-05-21 DIAGNOSIS — R1310 Dysphagia, unspecified: Secondary | ICD-10-CM | POA: Diagnosis not present

## 2013-05-26 DIAGNOSIS — E669 Obesity, unspecified: Secondary | ICD-10-CM | POA: Diagnosis not present

## 2013-05-26 DIAGNOSIS — F411 Generalized anxiety disorder: Secondary | ICD-10-CM | POA: Diagnosis not present

## 2013-05-26 DIAGNOSIS — J45909 Unspecified asthma, uncomplicated: Secondary | ICD-10-CM | POA: Diagnosis not present

## 2013-05-26 DIAGNOSIS — I10 Essential (primary) hypertension: Secondary | ICD-10-CM | POA: Diagnosis not present

## 2013-06-10 DIAGNOSIS — F329 Major depressive disorder, single episode, unspecified: Secondary | ICD-10-CM | POA: Diagnosis not present

## 2013-06-10 DIAGNOSIS — F17200 Nicotine dependence, unspecified, uncomplicated: Secondary | ICD-10-CM | POA: Diagnosis not present

## 2013-06-10 DIAGNOSIS — J449 Chronic obstructive pulmonary disease, unspecified: Secondary | ICD-10-CM | POA: Diagnosis not present

## 2013-06-23 DIAGNOSIS — M961 Postlaminectomy syndrome, not elsewhere classified: Secondary | ICD-10-CM | POA: Diagnosis not present

## 2013-06-24 DIAGNOSIS — R197 Diarrhea, unspecified: Secondary | ICD-10-CM | POA: Diagnosis not present

## 2013-06-24 DIAGNOSIS — K5901 Slow transit constipation: Secondary | ICD-10-CM | POA: Diagnosis not present

## 2013-06-24 DIAGNOSIS — R1310 Dysphagia, unspecified: Secondary | ICD-10-CM | POA: Diagnosis not present

## 2013-07-08 DIAGNOSIS — M961 Postlaminectomy syndrome, not elsewhere classified: Secondary | ICD-10-CM | POA: Diagnosis not present

## 2013-07-08 DIAGNOSIS — M47817 Spondylosis without myelopathy or radiculopathy, lumbosacral region: Secondary | ICD-10-CM | POA: Diagnosis not present

## 2013-07-15 DIAGNOSIS — F411 Generalized anxiety disorder: Secondary | ICD-10-CM | POA: Diagnosis not present

## 2013-07-15 DIAGNOSIS — F17200 Nicotine dependence, unspecified, uncomplicated: Secondary | ICD-10-CM | POA: Diagnosis not present

## 2013-07-28 DIAGNOSIS — G894 Chronic pain syndrome: Secondary | ICD-10-CM | POA: Diagnosis not present

## 2013-07-28 DIAGNOSIS — M5030 Other cervical disc degeneration, unspecified cervical region: Secondary | ICD-10-CM | POA: Diagnosis not present

## 2013-07-28 DIAGNOSIS — M961 Postlaminectomy syndrome, not elsewhere classified: Secondary | ICD-10-CM | POA: Diagnosis not present

## 2013-07-29 DIAGNOSIS — M4310 Spondylolisthesis, site unspecified: Secondary | ICD-10-CM | POA: Diagnosis not present

## 2013-07-29 DIAGNOSIS — M961 Postlaminectomy syndrome, not elsewhere classified: Secondary | ICD-10-CM | POA: Diagnosis not present

## 2013-07-29 DIAGNOSIS — M5137 Other intervertebral disc degeneration, lumbosacral region: Secondary | ICD-10-CM | POA: Diagnosis not present

## 2013-07-29 DIAGNOSIS — Z87891 Personal history of nicotine dependence: Secondary | ICD-10-CM | POA: Diagnosis not present

## 2013-07-29 DIAGNOSIS — S335XXA Sprain of ligaments of lumbar spine, initial encounter: Secondary | ICD-10-CM | POA: Diagnosis not present

## 2013-07-29 DIAGNOSIS — G894 Chronic pain syndrome: Secondary | ICD-10-CM | POA: Diagnosis not present

## 2013-08-26 DIAGNOSIS — I059 Rheumatic mitral valve disease, unspecified: Secondary | ICD-10-CM | POA: Diagnosis not present

## 2013-08-26 DIAGNOSIS — I429 Cardiomyopathy, unspecified: Secondary | ICD-10-CM | POA: Diagnosis not present

## 2013-08-26 DIAGNOSIS — I2510 Atherosclerotic heart disease of native coronary artery without angina pectoris: Secondary | ICD-10-CM | POA: Diagnosis not present

## 2013-09-29 DIAGNOSIS — G894 Chronic pain syndrome: Secondary | ICD-10-CM | POA: Diagnosis not present

## 2013-10-15 DIAGNOSIS — F329 Major depressive disorder, single episode, unspecified: Secondary | ICD-10-CM | POA: Diagnosis not present

## 2013-10-15 DIAGNOSIS — I10 Essential (primary) hypertension: Secondary | ICD-10-CM | POA: Diagnosis not present

## 2013-10-15 DIAGNOSIS — R05 Cough: Secondary | ICD-10-CM | POA: Diagnosis not present

## 2013-10-15 DIAGNOSIS — J329 Chronic sinusitis, unspecified: Secondary | ICD-10-CM | POA: Diagnosis not present

## 2013-11-25 DIAGNOSIS — F17200 Nicotine dependence, unspecified, uncomplicated: Secondary | ICD-10-CM | POA: Diagnosis not present

## 2013-11-25 DIAGNOSIS — I2510 Atherosclerotic heart disease of native coronary artery without angina pectoris: Secondary | ICD-10-CM | POA: Diagnosis not present

## 2013-11-25 DIAGNOSIS — F411 Generalized anxiety disorder: Secondary | ICD-10-CM | POA: Diagnosis not present

## 2013-11-25 DIAGNOSIS — I10 Essential (primary) hypertension: Secondary | ICD-10-CM | POA: Diagnosis not present

## 2013-12-19 DIAGNOSIS — J209 Acute bronchitis, unspecified: Secondary | ICD-10-CM | POA: Diagnosis not present

## 2013-12-30 DIAGNOSIS — G894 Chronic pain syndrome: Secondary | ICD-10-CM | POA: Diagnosis not present

## 2014-02-24 DIAGNOSIS — I10 Essential (primary) hypertension: Secondary | ICD-10-CM | POA: Diagnosis not present

## 2014-02-24 DIAGNOSIS — J45909 Unspecified asthma, uncomplicated: Secondary | ICD-10-CM | POA: Diagnosis not present

## 2014-02-24 DIAGNOSIS — E669 Obesity, unspecified: Secondary | ICD-10-CM | POA: Diagnosis not present

## 2014-02-24 DIAGNOSIS — F411 Generalized anxiety disorder: Secondary | ICD-10-CM | POA: Diagnosis not present

## 2014-04-02 DIAGNOSIS — Z79899 Other long term (current) drug therapy: Secondary | ICD-10-CM | POA: Diagnosis not present

## 2014-04-02 DIAGNOSIS — Z961 Presence of intraocular lens: Secondary | ICD-10-CM | POA: Diagnosis not present

## 2014-04-06 DIAGNOSIS — I10 Essential (primary) hypertension: Secondary | ICD-10-CM | POA: Diagnosis not present

## 2014-04-06 DIAGNOSIS — G4700 Insomnia, unspecified: Secondary | ICD-10-CM | POA: Diagnosis not present

## 2014-04-06 DIAGNOSIS — R5381 Other malaise: Secondary | ICD-10-CM | POA: Diagnosis not present

## 2014-04-06 DIAGNOSIS — R5383 Other fatigue: Secondary | ICD-10-CM | POA: Diagnosis not present

## 2014-04-15 DIAGNOSIS — Z803 Family history of malignant neoplasm of breast: Secondary | ICD-10-CM | POA: Diagnosis not present

## 2014-04-15 DIAGNOSIS — N6009 Solitary cyst of unspecified breast: Secondary | ICD-10-CM | POA: Diagnosis not present

## 2014-04-15 DIAGNOSIS — N63 Unspecified lump in unspecified breast: Secondary | ICD-10-CM | POA: Diagnosis not present

## 2014-04-15 DIAGNOSIS — Z1231 Encounter for screening mammogram for malignant neoplasm of breast: Secondary | ICD-10-CM | POA: Diagnosis not present

## 2014-04-16 DIAGNOSIS — Z803 Family history of malignant neoplasm of breast: Secondary | ICD-10-CM | POA: Diagnosis not present

## 2014-04-16 DIAGNOSIS — N63 Unspecified lump in unspecified breast: Secondary | ICD-10-CM | POA: Diagnosis not present

## 2014-04-16 DIAGNOSIS — N6009 Solitary cyst of unspecified breast: Secondary | ICD-10-CM | POA: Diagnosis not present

## 2014-04-16 DIAGNOSIS — Z1231 Encounter for screening mammogram for malignant neoplasm of breast: Secondary | ICD-10-CM | POA: Diagnosis not present

## 2014-04-27 DIAGNOSIS — R0602 Shortness of breath: Secondary | ICD-10-CM | POA: Diagnosis not present

## 2014-04-27 DIAGNOSIS — F411 Generalized anxiety disorder: Secondary | ICD-10-CM | POA: Diagnosis not present

## 2014-04-27 DIAGNOSIS — J449 Chronic obstructive pulmonary disease, unspecified: Secondary | ICD-10-CM | POA: Diagnosis not present

## 2014-04-27 DIAGNOSIS — E669 Obesity, unspecified: Secondary | ICD-10-CM | POA: Diagnosis not present

## 2014-04-27 DIAGNOSIS — I2510 Atherosclerotic heart disease of native coronary artery without angina pectoris: Secondary | ICD-10-CM | POA: Diagnosis not present

## 2014-04-28 DIAGNOSIS — M545 Low back pain, unspecified: Secondary | ICD-10-CM | POA: Diagnosis not present

## 2014-05-06 DIAGNOSIS — D72829 Elevated white blood cell count, unspecified: Secondary | ICD-10-CM | POA: Diagnosis not present

## 2014-05-12 ENCOUNTER — Telehealth: Payer: Self-pay | Admitting: Internal Medicine

## 2014-05-12 DIAGNOSIS — M545 Low back pain, unspecified: Secondary | ICD-10-CM | POA: Diagnosis not present

## 2014-05-12 DIAGNOSIS — M4310 Spondylolisthesis, site unspecified: Secondary | ICD-10-CM | POA: Diagnosis not present

## 2014-05-12 DIAGNOSIS — Z79899 Other long term (current) drug therapy: Secondary | ICD-10-CM | POA: Diagnosis not present

## 2014-05-12 DIAGNOSIS — Z9889 Other specified postprocedural states: Secondary | ICD-10-CM | POA: Diagnosis not present

## 2014-05-12 NOTE — Telephone Encounter (Signed)
C/D 05/12/14 for appt. 05/26/14 °

## 2014-05-12 NOTE — Telephone Encounter (Signed)
S/W PATIENT AND GAVE NP APPT FOR 06/09 @ 10:30 W/DR. CHISM. REFERRING DR. Arbie Cookey WEBB DX-ELEVATED WBC CT WELCOME PACKET MAILED.

## 2014-05-17 ENCOUNTER — Ambulatory Visit (HOSPITAL_BASED_OUTPATIENT_CLINIC_OR_DEPARTMENT_OTHER): Payer: Medicare Other | Attending: Family Medicine | Admitting: Radiology

## 2014-05-17 VITALS — Ht 60.0 in | Wt 148.0 lb

## 2014-05-17 DIAGNOSIS — G4733 Obstructive sleep apnea (adult) (pediatric): Secondary | ICD-10-CM

## 2014-05-17 DIAGNOSIS — R0609 Other forms of dyspnea: Secondary | ICD-10-CM | POA: Insufficient documentation

## 2014-05-17 DIAGNOSIS — G4761 Periodic limb movement disorder: Secondary | ICD-10-CM | POA: Diagnosis not present

## 2014-05-17 DIAGNOSIS — G4730 Sleep apnea, unspecified: Principal | ICD-10-CM

## 2014-05-17 DIAGNOSIS — G4710 Hypersomnia, unspecified: Secondary | ICD-10-CM | POA: Diagnosis not present

## 2014-05-17 DIAGNOSIS — R0989 Other specified symptoms and signs involving the circulatory and respiratory systems: Secondary | ICD-10-CM | POA: Diagnosis not present

## 2014-05-23 DIAGNOSIS — G4733 Obstructive sleep apnea (adult) (pediatric): Secondary | ICD-10-CM | POA: Diagnosis not present

## 2014-05-23 DIAGNOSIS — G4710 Hypersomnia, unspecified: Secondary | ICD-10-CM | POA: Diagnosis not present

## 2014-05-23 DIAGNOSIS — G4730 Sleep apnea, unspecified: Secondary | ICD-10-CM

## 2014-05-23 NOTE — Sleep Study (Signed)
   NAME: [name] DATE OF BIRTH:  Aug 10, 1956 MEDICAL RECORD NUMBER [id number]  LOCATION: [facility]  PHYSICIAN: Sabina Beavers D Chitara Clonch  DATE OF STUDY: 05/17/2014  SLEEP STUDY TYPE: Nocturnal Polysomnogram               REFERRING PHYSICIAN: [name], MD  INDICATION FOR STUDY: Hypersomnia with sleep apnea  EPWORTH SLEEPINESS SCORE:   3/24 HEIGHT: 5' (152.4 cm)  WEIGHT: 67.132 kg (148 lb)    Body mass index is 28.9 kg/(m^2).  NECK SIZE: 14 in.  MEDICATIONS: Charted for review  SLEEP ARCHITECTURE: Split study protocol. During the diagnostic phase, total sleep time 122 minutes with sleep efficiency 61.2%. Stage I was 16%, stage II 84%, stage III and REM were absent. Sleep latency 59 minutes, awake after sleep onset 19 minutes, arousal index 52.1, bedtime medication: Valium, Cymbalta, Lopressor, Desyrel  RESPIRATORY DATA: Apnea hypopneas index (AHI) 30.5 per hour 62 total events scored including 4 obstructive apneas, 1 mixed apnea, 57 hypopneas All events were recorded while nonsupine. CPAP titrated to 9 CWP, AHI 0 per hour. She wore a small [employer] fit F. 10 fullface mask with heated humidity.  OXYGEN DATA: Moderately loud snoring before CPAP with oxygen desaturation to a nadir of 79% on room air. With CPAP control mean oxygen saturation held 89.8% on room air.  CARDIAC DATA: Sinus rhythm  MOVEMENT/PARASOMNIA: During the diagnostic phase there were 187 limb jerks which 33 were associated with arousal or awakening for periodic limb movement with arousal index of 16.2 per hour. During the titration phase there were 36 limb jerks of which 9 were associated with arousal or awakening for periodic limb movement with arousal index of 3.3 per hour. Bathroom x1  IMPRESSION/ RECOMMENDATION:   1) Severe obstructive sleep apnea/hypoxia syndrome, AHI 30.5 per hour with nonsupine events. Moderately loud snoring with oxygen desaturation to a nadir of 79% on room air  2)  Successful CPAP titration to 9 CWP, AHI 0 per hour. She wore a small [employer] AirFit F10 full face mask with heated humidifier. Snoring was prevented and mean oxygen saturation held 89.8% on room air. 3) Periodic limb movement with arousal.  If this pattern persists after adjusted for CPAP in the home environment, then a trial of specific therapy such as Requip or ear Be considered.  Signed Baird Lyons M.D. [place], [employer] [facility] of Sleep Medicine  ELECTRONICALLY SIGNED ON:  05/23/2014, 1:31 PM [place] PH: [phone]   FX: [phone] [place]

## 2014-05-26 ENCOUNTER — Encounter: Payer: Self-pay | Admitting: Internal Medicine

## 2014-05-26 ENCOUNTER — Ambulatory Visit (HOSPITAL_BASED_OUTPATIENT_CLINIC_OR_DEPARTMENT_OTHER): Payer: Medicare Other | Admitting: Internal Medicine

## 2014-05-26 ENCOUNTER — Ambulatory Visit: Payer: Medicare Other

## 2014-05-26 ENCOUNTER — Other Ambulatory Visit (HOSPITAL_BASED_OUTPATIENT_CLINIC_OR_DEPARTMENT_OTHER): Payer: Medicare Other

## 2014-05-26 ENCOUNTER — Other Ambulatory Visit: Payer: Self-pay | Admitting: Internal Medicine

## 2014-05-26 VITALS — BP 134/68 | HR 61 | Temp 98.5°F | Resp 18 | Ht 59.0 in | Wt 147.1 lb

## 2014-05-26 DIAGNOSIS — F17200 Nicotine dependence, unspecified, uncomplicated: Secondary | ICD-10-CM

## 2014-05-26 DIAGNOSIS — D72829 Elevated white blood cell count, unspecified: Secondary | ICD-10-CM

## 2014-05-26 DIAGNOSIS — Z720 Tobacco use: Secondary | ICD-10-CM | POA: Insufficient documentation

## 2014-05-26 LAB — CBC WITH DIFFERENTIAL/PLATELET
BASO%: 1.2 % (ref 0.0–2.0)
Basophils Absolute: 0.1 10*3/uL (ref 0.0–0.1)
EOS%: 7.3 % — AB (ref 0.0–7.0)
Eosinophils Absolute: 0.8 10*3/uL — ABNORMAL HIGH (ref 0.0–0.5)
HCT: 43.2 % (ref 34.8–46.6)
HGB: 14.2 g/dL (ref 11.6–15.9)
LYMPH%: 15.3 % (ref 14.0–49.7)
MCH: 29 pg (ref 25.1–34.0)
MCHC: 32.9 g/dL (ref 31.5–36.0)
MCV: 87.9 fL (ref 79.5–101.0)
MONO#: 0.6 10*3/uL (ref 0.1–0.9)
MONO%: 5.7 % (ref 0.0–14.0)
NEUT%: 70.5 % (ref 38.4–76.8)
NEUTROS ABS: 7.7 10*3/uL — AB (ref 1.5–6.5)
PLATELETS: 292 10*3/uL (ref 145–400)
RBC: 4.92 10*6/uL (ref 3.70–5.45)
RDW: 13.5 % (ref 11.2–14.5)
WBC: 10.9 10*3/uL — ABNORMAL HIGH (ref 3.9–10.3)
lymph#: 1.7 10*3/uL (ref 0.9–3.3)

## 2014-05-26 LAB — COMPREHENSIVE METABOLIC PANEL (CC13)
ALK PHOS: 113 U/L (ref 40–150)
ALT: 17 U/L (ref 0–55)
ANION GAP: 10 meq/L (ref 3–11)
AST: 21 U/L (ref 5–34)
Albumin: 3.7 g/dL (ref 3.5–5.0)
BILIRUBIN TOTAL: 0.27 mg/dL (ref 0.20–1.20)
BUN: 12.9 mg/dL (ref 7.0–26.0)
CO2: 29 mEq/L (ref 22–29)
CREATININE: 0.9 mg/dL (ref 0.6–1.1)
Calcium: 9.4 mg/dL (ref 8.4–10.4)
Chloride: 102 mEq/L (ref 98–109)
Glucose: 115 mg/dl (ref 70–140)
Potassium: 3.5 mEq/L (ref 3.5–5.1)
SODIUM: 140 meq/L (ref 136–145)
TOTAL PROTEIN: 6.9 g/dL (ref 6.4–8.3)

## 2014-05-26 LAB — CHCC SMEAR

## 2014-05-26 LAB — LACTATE DEHYDROGENASE (CC13): LDH: 161 U/L (ref 125–245)

## 2014-05-26 NOTE — Progress Notes (Signed)
No pic could be taken-scanner down. She has appt card and has not been out of the country. No issues during chck in-she has not seen the dr yet.

## 2014-05-26 NOTE — Progress Notes (Signed)
[place] [phone]   [phone]  NEW PATIENT EVALUATION   Name: [name] Date: 05/26/2014 MRN: [id number] DOB: 09-Feb-1956  PCP: [name], MD   REFERRING PHYSICIAN: Hulen Shouts, *  REASON FOR REFERRAL: Leukocytosis NOS   HISTORY OF PRESENT ILLNESS:[name] is a 58 y.o. female who is being referred for further evlauation of her elevated WBC by Dr. Maurice Small, MD of [place].  She has an exensive past medical hisotry as noted below.  She was last seen by Dr. Maurice Small on 05/08/2014.    Her WBC was 12.2 (4.0 - 11.0 K/ul), Hgb of 13.7 g/DL, MCV of 89.2 fL, plt of 266 K/uL.   Her differential demonstrated BASO % of 1.1 (0 -1%), Neutrophila number of 8.2 (1.9 - 7.2 K/uL), Lymph # of 2.00, Mono # of 0.9 ( 0.3 -0.8 K/uL) and EOS # of 0.9 (0 - 0.6 K/uL).  Her CBC on 04/06/2014 revealed an elevated WBC of 11.8 (4- 11 K/uL); Hgb of 14.0 with an MCV of 87.5 fL and plts of 273.  Baso % of 1.2 and neutrophil 8.4 (1.9 - 7.2 K/uL).  Her chemistries revealed normal creatinine of 0.81 and normal liver function tests and TSH of 1.81 uIU/mL.  [name]  Patient has a longstanding smoking history since age 62.  She quit for 4 years in 2003 but now reports smoking nearly 1.5 packs per day now.  She reports a mild case of COPD with albuterol as needed.  She reports that her prior PCP, Dr. Melina Modena, suggested that it was elevated.   She denies fevers or chills.  She follows cardiology regularly for history of heart murmur.  She had her last Stress test last year. She has neurostimular implanted in her bladder in 2013 for OAB.  She denies recurrent infections.  She denies any recent surgeries. She is up-to-date for her mammogram was this year and found a cyst in right breast on 29th of April.  They recommended a six month follow up.  Her colonoscopy was last year by Dr. Amedeo Plenty of [facility] without removal of polyps.  He female exam was done last year.  She  denies oral steroids.  She does receive an epidural steroid in her back with the last one done May 12th, 2015.  She had an endscosopy last year requiring esophageal dilatation.  She follows up with Dr. Amedeo Plenty annually or as needed.  She has been disabled since 2009 and worked as a Engineer, production prior.  She does arthralgias disffusely.   PAST MEDICAL HISTORY:  has a past medical history of Hypertension; Coronary artery disease (CARDIOLOGIST- DR  Doylene Canard  (VISIT 03-30-11 W/ CHART)); Asthma; Non-productive cough; Numbness in both hands (AT TIMES); DJD (degenerative joint disease); DDD (degenerative disc disease); Fibromyalgia; Depression; Anxiety; GERD (gastroesophageal reflux disease) (AND HIATIAL HERNIA); History of gastric ulcer (2004); IBS (irritable bowel syndrome); Hemorrhoids; Urge urinary incontinence; Frequency of urination; Nocturia; Iron deficiency anemia; Agoraphobia without history of panic disorder; Insomnia; and Incisional pain (s/p interstim implant 1st stage--- 12-07-11 ).     PAST SURGICAL HISTORY: Past Surgical History  Procedure Laterality Date  . Cardiac catheterization  2003  . Cysto/ hod/ bladder bx/ fulgeration  06-12-2011  . Cervical fusion  2008    C4 - T1  . Laminectomy  2007    L3 - 5  . Cysto/ hod/ bladder bx  2006  . Hysteroscopy w/d&c  2005  .  Cervical discectomy  2002    C4 - 7  . Right thumb surg.  2001  . Cholecystectomy  1982  . Tonsillectomy and adenoidectomy  1965  . Interstim implant placement  12/07/2011    Procedure: INTERSTIM IMPLANT FIRST STAGE;  Surgeon: [name], MD;  Location: [facility];  Service: Urology;  Laterality: Right;  . Interstim implant placement  12/14/2011    Procedure: INTERSTIM IMPLANT SECOND STAGE;  Surgeon: [name], MD;  Location[facility];  Service: Urology;  Laterality: Right;  rad tech ok by vickie at main     CURRENT MEDICATIONS: has a current medication list  which includes the following prescription(s): albuterol, amlodipine, aspirin ec, bupropion, cholecalciferol, cyanocobalamin, diazepam, duloxetine, esomeprazole, ferrous sulfate, hydrocodone-acetaminophen, ibuprofen, magnesium gluconate, methocarbamol, metoprolol tartrate, trazodone, and vitamin c.   ALLERGIES: Statins; Ace inhibitors; Amoxicillin; Midazolam hcl; Nsaids; Prednisone; and Sulfa antibiotics   SOCIAL HISTORY:  reports that she has been smoking.  She has never used smokeless tobacco. She reports that she drinks alcohol. She reports that she does not use illicit drugs.   FAMILY HISTORY: Mother died of heart disease and altheimers at age 27.  Her father history is unknown.  She has daughter, age 55.  She has one kidney but otherwise not blood related problems.  LABORATORY DATA:  Results for orders placed in visit on 05/26/14 (from the past 48 hour(s))  CBC WITH DIFFERENTIAL     Status: Abnormal   Collection Time    05/26/14 10:39 AM      Result Value Ref Range   WBC 10.9 (*) 3.9 - 10.3 10e3/uL   NEUT# 7.7 (*) 1.5 - 6.5 10e3/uL   HGB 14.2  11.6 - 15.9 g/dL   HCT 43.2  34.8 - 46.6 %   Platelets 292  145 - 400 10e3/uL   MCV 87.9  79.5 - 101.0 fL   MCH 29.0  25.1 - 34.0 pg   MCHC 32.9  31.5 - 36.0 g/dL   RBC 4.92  3.70 - 5.45 10e6/uL   RDW 13.5  11.2 - 14.5 %   lymph# 1.7  0.9 - 3.3 10e3/uL   MONO# 0.6  0.1 - 0.9 10e3/uL   Eosinophils Absolute 0.8 (*) 0.0 - 0.5 10e3/uL   Basophils Absolute 0.1  0.0 - 0.1 10e3/uL   NEUT% 70.5  38.4 - 76.8 %   LYMPH% 15.3  14.0 - 49.7 %   MONO% 5.7  0.0 - 14.0 %   EOS% 7.3 (*) 0.0 - 7.0 %   BASO% 1.2  0.0 - 2.0 %  [facility] SMEAR     Status: None   Collection Time    05/26/14 10:39 AM      Result Value Ref Range   Smear Result Smear Available    LACTATE DEHYDROGENASE (CC13)     Status: None   Collection Time    05/26/14 10:39 AM      Result Value Ref Range   LDH 161  125 - 245 U/L  COMPREHENSIVE METABOLIC PANEL (HA19)     Status: None    Collection Time    05/26/14 10:39 AM      Result Value Ref Range   Sodium 140  136 - 145 mEq/L   Potassium 3.5  3.5 - 5.1 mEq/L   Chloride 102  98 - 109 mEq/L   CO2 29  22 - 29 mEq/L   Glucose 115  70 - 140 mg/dl   BUN 12.9  7.0 - 26.0 mg/dL   Creatinine 0.9  0.6 - 1.1 mg/dL   Total Bilirubin 0.27  0.20 - 1.20 mg/dL   Alkaline Phosphatase 113  40 - 150 U/L   AST 21  5 - 34 U/L   ALT 17  0 - 55 U/L   Total Protein 6.9  6.4 - 8.3 g/dL   Albumin 3.7  3.5 - 5.0 g/dL   Calcium 9.4  8.4 - 10.4 mg/dL   Anion Gap 10  3 - 11 mEq/L       RADIOGRAPHY: No results found.     REVIEW OF SYSTEMS:  Constitutional: Denies fevers, chills or abnormal weight loss Eyes: Denies blurriness of vision Ears, nose, mouth, throat, and face: Denies mucositis or sore throat Respiratory: Denies cough, dyspnea or wheezes Cardiovascular: Denies palpitation, chest discomfort or lower extremity swelling Gastrointestinal:  Denies nausea, heartburn or change in bowel habits Skin: Denies abnormal skin rashes Lymphatics: Denies new lymphadenopathy or easy bruising Neurological:Denies numbness, tingling or new weaknesses Behavioral/Psych: Mood is stable, no new changes  All other systems were reviewed with the patient and are negative.  PHYSICAL EXAM:  height is 4\' 11"  (1.499 m) and weight is 147 lb 1.6 oz (66.724 kg). Her oral temperature is 98.5 F (36.9 C). Her blood pressure is 134/68 and her pulse is 61. Her respiration is 18 and oxygen saturation is 96%.    GENERAL:alert, no distress and comfortable, mildly obese SKIN: skin color, texture, turgor are normal, no rashes or significant lesions EYES: normal, Conjunctiva are pink and non-injected, sclera clear OROPHARYNX:no exudate, no erythema and lips, buccal mucosa, and tongue normal  NECK: supple, thyroid normal size, non-tender, without nodularity LYMPH:  no palpable lymphadenopathy in the cervical, axillary or inguinal LUNGS: clear to auscultation and  percussion with normal breathing effort HEART: regular rate & rhythm and no murmurs and no lower extremity edema ABDOMEN:abdomen soft, non-tender and normal bowel sounds Musculoskeletal:no cyanosis of digits and no clubbing  NEURO: alert & oriented x 3 with fluent speech, no focal motor/sensory deficits   IMPRESSION: AUBRINA NIEMAN is a 58 y.o. female with a history of    PLAN:  1.  Leukocytosis NOS.  --Patient has chronic leukocytosis. Today, her WBC is 10.9 with a mild eosinophilia.   Given normal plts and hemoglobin, her leukocytosis is less likely to be secondary to a primary bone marrow problem.  Likely causes of her mild, chronic leukocytosis includes her smoking and/or chronic inflammation. Smoking is a common cause of mild chronic leukocytosis.  It mechanism is unknown but some groups think that it is related to smoked-induced inflammation.   She was counseled on the need to stop smoking.  We will repeat in her labs in 6 months to establish her trend and have her back in our offices in one year.  Other considerations for causes of her mild leukocytosis could be steroid-induced; however, she denies recent steroid with exception of a back injection in May.  A leukocytosis handout was provided.   2. Tobacco Abuse. --Patient is a 1.5 pack per day smoker.  She desires a referral to tobacco cessation classes.  Tobacco cessation handout and a referral was provided.   3. Follow up. --Patient will have follow up in one year with repeat labs including CBC and CMP.  She will also have labs including a CBC in 6 months.   All questions were answered. The patient knows to call the clinic with any problems, questions or concerns. We  can certainly see the patient much sooner if necessary.  I spent 25 minutes counseling the patient face to face. The total time spent in the appointment was 45 minutes.    Concha Norway, MD 05/26/2014 1:08 PM

## 2014-05-26 NOTE — Patient Instructions (Signed)
Smoking Cessation Quitting smoking is important to your health and has many advantages. However, it is not always easy to quit since nicotine is a very addictive drug. Often times, people try 3 times or more before being able to quit. This document explains the best ways for you to prepare to quit smoking. Quitting takes hard work and a lot of effort, but you can do it. ADVANTAGES OF QUITTING SMOKING  You will live longer, feel better, and live better.  Your body will feel the impact of quitting smoking almost immediately.  Within 20 minutes, blood pressure decreases. Your pulse returns to its normal level.  After 8 hours, carbon monoxide levels in the blood return to normal. Your oxygen level increases.  After 24 hours, the chance of having a heart attack starts to decrease. Your breath, hair, and body stop smelling like smoke.  After 48 hours, damaged nerve endings begin to recover. Your sense of taste and smell improve.  After 72 hours, the body is virtually free of nicotine. Your bronchial tubes relax and breathing becomes easier.  After 2 to 12 weeks, lungs can hold more air. Exercise becomes easier and circulation improves.  The risk of having a heart attack, stroke, cancer, or lung disease is greatly reduced.  After 1 year, the risk of coronary heart disease is cut in half.  After 5 years, the risk of stroke falls to the same as a nonsmoker.  After 10 years, the risk of lung cancer is cut in half and the risk of other cancers decreases significantly.  After 15 years, the risk of coronary heart disease drops, usually to the level of a nonsmoker.  If you are pregnant, quitting smoking will improve your chances of having a healthy baby.  The people you live with, especially any children, will be healthier.  You will have extra money to spend on things other than cigarettes. QUESTIONS TO THINK ABOUT BEFORE ATTEMPTING TO QUIT You may want to talk about your answers with your  caregiver.  Why do you want to quit?  If you tried to quit in the past, what helped and what did not?  What will be the most difficult situations for you after you quit? How will you plan to handle them?  Who can help you through the tough times? Your family? Friends? A caregiver?  What pleasures do you get from smoking? What ways can you still get pleasure if you quit? Here are some questions to ask your caregiver:  How can you help me to be successful at quitting?  What medicine do you think would be best for me and how should I take it?  What should I do if I need more help?  What is smoking withdrawal like? How can I get information on withdrawal? GET READY  Set a quit date.  Change your environment by getting rid of all cigarettes, ashtrays, matches, and lighters in your home, car, or work. Do not let people smoke in your home.  Review your past attempts to quit. Think about what worked and what did not. GET SUPPORT AND ENCOURAGEMENT You have a better chance of being successful if you have help. You can get support in many ways.  Tell your family, friends, and co-workers that you are going to quit and need their support. Ask them not to smoke around you.  Get individual, group, or telephone counseling and support. Programs are available at local hospitals and health centers. Call your local health department for   information about programs in your area.  Spiritual beliefs and practices may help some smokers quit.  Download a "quit meter" on your computer to keep track of quit statistics, such as how long you have gone without smoking, cigarettes not smoked, and money saved.  Get a self-help book about quitting smoking and staying off of tobacco. [place] yourself from urges to smoke. Talk to someone, go for a walk, or occupy your time with a task.  Change your normal routine. Take a different route to work. Drink tea instead of coffee.  Eat breakfast in a different place.  Reduce your stress. Take a hot bath, exercise, or read a book.  Plan something enjoyable to do every day. Reward yourself for not smoking.  Explore interactive web-based programs that specialize in helping you quit. GET MEDICINE AND USE IT CORRECTLY Medicines can help you stop smoking and decrease the urge to smoke. Combining medicine with the above behavioral methods and support can greatly increase your chances of successfully quitting smoking.  Nicotine replacement therapy helps deliver nicotine to your body without the negative effects and risks of smoking. Nicotine replacement therapy includes nicotine gum, lozenges, inhalers, nasal sprays, and skin patches. Some may be available over-the-counter and others require a prescription.  Antidepressant medicine helps people abstain from smoking, but how this works is unknown. This medicine is available by prescription.  Nicotinic receptor partial agonist medicine simulates the effect of nicotine in your brain. This medicine is available by prescription. Ask your caregiver for advice about which medicines to use and how to use them based on your health history. Your caregiver will tell you what side effects to look out for if you choose to be on a medicine or therapy. Carefully read the information on the package. Do not use any other product containing nicotine while using a nicotine replacement product.  RELAPSE OR DIFFICULT SITUATIONS Most relapses occur within the first 3 months after quitting. Do not be discouraged if you start smoking again. Remember, most people try several times before finally quitting. You may have symptoms of withdrawal because your body is used to nicotine. You may crave cigarettes, be irritable, feel very hungry, cough often, get headaches, or have difficulty concentrating. The withdrawal symptoms are only temporary. They are strongest when you first quit, but they will go away within  10 14 days. To reduce the chances of relapse, try to:  Avoid drinking alcohol. Drinking lowers your chances of successfully quitting.  Reduce the amount of caffeine you consume. Once you quit smoking, the amount of caffeine in your body increases and can give you symptoms, such as a rapid heartbeat, sweating, and anxiety.  Avoid smokers because they can make you want to smoke.  Do not let weight gain distract you. Many smokers will gain weight when they quit, usually less than 10 pounds. Eat a healthy diet and stay active. You can always lose the weight gained after you quit.  Find ways to improve your mood other than smoking. FOR MORE INFORMATION  www.smokefree.gov  Document Released: 11/28/2001 Document Revised: 06/04/2012 Document Reviewed: 03/14/2012 [facility] Patient Information 2014 [place], [place]. Leukocytosis Leukocytosis means you have more white blood cells than normal. White blood cells are made in your bone marrow. The main job of white blood cells is to fight infection. Having too many white blood cells is a common condition. It can develop as a result of many types of medical problems. CAUSES  In some cases,  your bone marrow may be normal, but it is still making too many white blood cells. This could be the result of:  Infection.  Injury.  Physical stress.  Emotional stress.  Surgery.  Allergic reactions.  Tumors that do not start in the blood or bone marrow.  An inherited disease.  Certain medicines.  Pregnancy and labor. In other cases, you may have a bone marrow disorder that is causing your body to make too many white blood cells. Bone marrow disorders include:  Leukemia. This is a type of blood cancer.  Myeloproliferative disorders. These disorders cause blood cells to grow abnormally. SYMPTOMS  Some people have no symptoms. Others have symptoms due to the medical problem that is causing their leukocytosis. These symptoms may  include:  Bleeding.  Bruising.  Fever.  Night sweats.  Repeated infections.  Weakness.  Weight loss. DIAGNOSIS  Leukocytosis is often found during blood tests that are done as part of a normal physical exam. Your caregiver will probably order other tests to help determine why you have too many white blood cells. These tests may include:  A complete blood count (CBC). This test measures all the types of blood cells in your body.  Chest X-rays, urine tests (urinalysis), or other tests to look for signs of infection.  Bone marrow aspiration. For this test, a needle is put into your bone. Cells from the bone marrow are removed through the needle. The cells are then examined under a microscope. TREATMENT  Treatment is usually not needed for leukocytosis. However, if a disorder is causing your leukocytosis, it will need to be treated. Treatment may include:  Antibiotic medicines if you have a bacterial infection.  Bone marrow transplant. Your diseased bone marrow is replaced with healthy cells that will grow new bone marrow.  Chemotherapy. This is the use of drugs to kill cancer cells. HOME CARE INSTRUCTIONS  Only take over-the-counter or prescription medicines as directed by your caregiver.  Maintain a healthy weight. Ask your caregiver what weight is best for you.  Eat foods that are low in saturated fats and high in fiber. Eat plenty of fruits and vegetables.  Drink enough fluids to keep your urine clear or pale yellow.  Get 30 minutes of exercise at least 5 times a week. Check with your caregiver before starting a new exercise routine.  Limit caffeine and alcohol.  Do not smoke.  Keep all follow-up appointments as directed by your caregiver. SEEK MEDICAL CARE IF:  You feel weak or more tired than usual.  You develop chills, a cough, or nasal congestion.  You lose weight without trying.  You have night sweats.  You bruise easily. SEEK IMMEDIATE MEDICAL CARE  IF:  You bleed more than normal.  You have chest pain.  You have trouble breathing.  You have a fever.  You have uncontrolled nausea or vomiting.  You feel dizzy or lightheaded. MAKE SURE YOU:  Understand these instructions.  Will watch your condition.  Will get help right away if you are not doing well or get worse. Document Released: 11/23/2011 Document Revised: 02/26/2012 Document Reviewed: 11/23/2011 [facility] Patient Information 2014 [place], [place].

## 2014-05-30 ENCOUNTER — Telehealth: Payer: Self-pay | Admitting: Internal Medicine

## 2014-06-25 DIAGNOSIS — I10 Essential (primary) hypertension: Secondary | ICD-10-CM | POA: Diagnosis not present

## 2014-06-25 DIAGNOSIS — J449 Chronic obstructive pulmonary disease, unspecified: Secondary | ICD-10-CM | POA: Diagnosis not present

## 2014-06-25 DIAGNOSIS — G4730 Sleep apnea, unspecified: Secondary | ICD-10-CM | POA: Diagnosis not present

## 2014-06-25 DIAGNOSIS — F17200 Nicotine dependence, unspecified, uncomplicated: Secondary | ICD-10-CM | POA: Diagnosis not present

## 2014-07-08 ENCOUNTER — Emergency Department (HOSPITAL_COMMUNITY)
Admission: EM | Admit: 2014-07-08 | Discharge: 2014-07-09 | Disposition: A | Discharge status: Home / Self Care | Payer: Medicare Other | Attending: Emergency Medicine | Admitting: Emergency Medicine

## 2014-07-08 DIAGNOSIS — K219 Gastro-esophageal reflux disease without esophagitis: Secondary | ICD-10-CM | POA: Diagnosis not present

## 2014-07-08 DIAGNOSIS — Z862 Personal history of diseases of the blood and blood-forming organs and certain disorders involving the immune mechanism: Secondary | ICD-10-CM | POA: Insufficient documentation

## 2014-07-08 DIAGNOSIS — I2510 Atherosclerotic heart disease of native coronary artery without angina pectoris: Secondary | ICD-10-CM | POA: Insufficient documentation

## 2014-07-08 DIAGNOSIS — T63441A Toxic effect of venom of bees, accidental (unintentional), initial encounter: Secondary | ICD-10-CM

## 2014-07-08 DIAGNOSIS — F329 Major depressive disorder, single episode, unspecified: Secondary | ICD-10-CM | POA: Diagnosis not present

## 2014-07-08 DIAGNOSIS — F3289 Other specified depressive episodes: Secondary | ICD-10-CM | POA: Insufficient documentation

## 2014-07-08 DIAGNOSIS — Z7982 Long term (current) use of aspirin: Secondary | ICD-10-CM | POA: Insufficient documentation

## 2014-07-08 DIAGNOSIS — Z79899 Other long term (current) drug therapy: Secondary | ICD-10-CM | POA: Diagnosis not present

## 2014-07-08 DIAGNOSIS — G4733 Obstructive sleep apnea (adult) (pediatric): Secondary | ICD-10-CM | POA: Insufficient documentation

## 2014-07-08 DIAGNOSIS — Z9889 Other specified postprocedural states: Secondary | ICD-10-CM | POA: Insufficient documentation

## 2014-07-08 DIAGNOSIS — I10 Essential (primary) hypertension: Secondary | ICD-10-CM | POA: Insufficient documentation

## 2014-07-08 DIAGNOSIS — T63461A Toxic effect of venom of wasps, accidental (unintentional), initial encounter: Secondary | ICD-10-CM | POA: Diagnosis not present

## 2014-07-08 DIAGNOSIS — Y9389 Activity, other specified: Secondary | ICD-10-CM | POA: Diagnosis not present

## 2014-07-08 DIAGNOSIS — Y9289 Other specified places as the place of occurrence of the external cause: Secondary | ICD-10-CM | POA: Diagnosis not present

## 2014-07-08 DIAGNOSIS — F17200 Nicotine dependence, unspecified, uncomplicated: Secondary | ICD-10-CM | POA: Insufficient documentation

## 2014-07-08 DIAGNOSIS — F411 Generalized anxiety disorder: Secondary | ICD-10-CM | POA: Diagnosis not present

## 2014-07-08 DIAGNOSIS — J45909 Unspecified asthma, uncomplicated: Secondary | ICD-10-CM | POA: Insufficient documentation

## 2014-07-08 DIAGNOSIS — Z8739 Personal history of other diseases of the musculoskeletal system and connective tissue: Secondary | ICD-10-CM | POA: Insufficient documentation

## 2014-07-08 DIAGNOSIS — T6391XA Toxic effect of contact with unspecified venomous animal, accidental (unintentional), initial encounter: Secondary | ICD-10-CM | POA: Diagnosis not present

## 2014-07-09 ENCOUNTER — Encounter (HOSPITAL_COMMUNITY): Payer: Self-pay | Admitting: Emergency Medicine

## 2014-07-09 DIAGNOSIS — T6391XA Toxic effect of contact with unspecified venomous animal, accidental (unintentional), initial encounter: Secondary | ICD-10-CM | POA: Diagnosis not present

## 2014-07-09 MED ORDER — MORPHINE SULFATE 4 MG/ML IJ SOLN
4.0000 mg | Freq: Once | INTRAMUSCULAR | Status: AC
  Administered 2014-07-09: 4 mg via INTRAMUSCULAR
  Filled 2014-07-09: qty 1

## 2014-07-09 NOTE — Discharge Instructions (Signed)
Use ice over your stings to help with swelling. Continue your home pain medications and followup with her doctor.    Bee, Wasp, or Hornet Sting Your caregiver has diagnosed you as having an insect sting. An insect sting appears as a red lump in the skin that sometimes has a tiny hole in the center, or it may have a stinger in the center of the wound. The most common stings are from wasps, hornets and bees. Individuals have different reactions to insect stings.  A normal reaction may cause pain, swelling, and redness around the sting site.  A localized allergic reaction may cause swelling and redness that extends beyond the sting site.  A large local reaction may continue to develop over the next 12 to 36 hours.  On occasion, the reactions can be severe (anaphylactic reaction). An anaphylactic reaction may cause wheezing; difficulty breathing; chest pain; fainting; raised, itchy, red patches on the skin; a sick feeling to your stomach (nausea); vomiting; cramping; or diarrhea. If you have had an anaphylactic reaction to an insect sting in the past, you are more likely to have one again. HOME CARE INSTRUCTIONS   With bee stings, a small sac of poison is left in the wound. Brushing across this with something such as a credit card, or anything similar, will help remove this and decrease the amount of the reaction. This same procedure will not help a wasp sting as they do not leave behind a stinger and poison sac.  Apply a cold compress for 10 to 20 minutes every hour for 1 to 2 days, depending on severity, to reduce swelling and itching.  To lessen pain, a paste made of water and baking soda may be rubbed on the bite or sting and left on for 5 minutes.  To relieve itching and swelling, you may use take medication or apply medicated creams or lotions as directed.  Only take over-the-counter or prescription medicines for pain, discomfort, or fever as directed by your caregiver.  Wash the sting  site daily with soap and water. Apply antibiotic ointment on the sting site as directed.  If you suffered a severe reaction:  If you did not require hospitalization, an adult will need to stay with you for 24 hours in case the symptoms return.  You may need to wear a medical bracelet or necklace stating the allergy.  You and your family need to learn when and how to use an anaphylaxis kit or epinephrine injection.  If you have had a severe reaction before, always carry your anaphylaxis kit with you. SEEK MEDICAL CARE IF:   None of the above helps within 2 to 3 days.  The area becomes red, warm, tender, and swollen beyond the area of the bite or sting.  You have an oral temperature above 102 F (38.9 C). SEEK IMMEDIATE MEDICAL CARE IF:  You have symptoms of an allergic reaction which are:  Wheezing.  Difficulty breathing.  Chest pain.  Lightheadedness or fainting.  Itchy, raised, red patches on the skin.  Nausea, vomiting, cramping or diarrhea. ANY OF THESE SYMPTOMS MAY REPRESENT A SERIOUS PROBLEM THAT IS AN EMERGENCY. Do not wait to see if the symptoms will go away. Get medical help right away. Call your local emergency services (911 in [place] DO NOT drive yourself to the hospital. MAKE SURE YOU:   Understand these instructions.  Will watch your condition.  Will get help right away if you are not doing well or get worse. Document Released:  12/04/2005 Document Revised: 02/26/2012 Document Reviewed: 05/21/2010 Exit[employer] Patient Information 2015 [place], [place]. This information is not intended to replace advice given to you by your health care provider. Make sure you discuss any questions you have with your health care provider.

## 2014-07-09 NOTE — ED Provider Notes (Signed)
Medical screening examination/treatment/procedure(s) were performed by non-physician practitioner and as supervising physician I was immediately available for consultation/collaboration.   EKG Interpretation None       Kalman Drape, MD 07/09/14 [phone]

## 2014-07-09 NOTE — ED Notes (Signed)
Pt states she was mowing the yard and was attacked by a nest of yellow jackets. Pt estimates at least 14 stings on hands, ankles, legs, and armpits. Pt is in obvious pain.

## 2014-07-09 NOTE — ED Provider Notes (Signed)
CSN: [id number]     Arrival date & time 07/08/14  2350 History   First MD Initiated Contact with Patient 07/09/14 0008     Chief Complaint  Patient presents with  . Bee Stings    HPI  History provided by the patient. The patient is a 58 year old female presenting with multiple yellowjacket bee stings. Patient was mowing the lawn around 6 PM when she disturbed a yellow jacket nest in the ground and was stung multiple times on the bilateral feet and ankles and body. She's had significant pains to the areas since that time. She did try taking home Benadryl, hydrocodone 5 mg as well as muscle relaxer to help with symptoms but had no change in pain. She has no prior history of allergic reaction. She denies any rash of the skin. Denies any swelling of the lips mouth or throat. No difficulty breathing. No other aggravating or alleviating factors. No other associated symptoms.   Past Medical History  Diagnosis Date  . Hypertension   . Coronary artery disease CARDIOLOGIST- DR  Doylene Canard  (VISIT 03-30-11 W/ CHART)    NON-OBS. CAD   (STRESS TEST NOV. 2011  . Asthma   . Non-productive cough   . Numbness in both hands AT TIMES  . DJD (degenerative joint disease)     JOINT PAIN  . DDD (degenerative disc disease)   . Fibromyalgia   . Depression   . Anxiety   . GERD (gastroesophageal reflux disease) AND HIATIAL HERNIA    CONTROLLED W/ NEXIUM  . History of gastric ulcer 2004  . IBS (irritable bowel syndrome)   . Hemorrhoids   . Urge urinary incontinence   . Frequency of urination   . Nocturia   . Iron deficiency anemia   . Agoraphobia without history of panic disorder   . Insomnia   . Incisional pain s/p interstim implant 1st stage--- 12-07-11     left upper buttock-- pt states dressing clean dry and intact (on 12-08-11)  . OSA on CPAP    Past Surgical History  Procedure Laterality Date  . Cardiac catheterization  2003  . Cysto/ hod/ bladder bx/ fulgeration  06-12-2011  . Cervical fusion   2008    C4 - T1  . Laminectomy  2007    L3 - 5  . Cysto/ hod/ bladder bx  2006  . Hysteroscopy w/d&c  2005  . Cervical discectomy  2002    C4 - 7  . Right thumb surg.  2001  . Cholecystectomy  1982  . Tonsillectomy and adenoidectomy  1965  . Interstim implant placement  12/07/2011    Procedure: INTERSTIM IMPLANT FIRST STAGE;  Surgeon: [name], MD;  Location: [facility];  Service: Urology;  Laterality: Right;  . Interstim implant placement  12/14/2011    Procedure: INTERSTIM IMPLANT SECOND STAGE;  Surgeon: [name], MD;  Location: [facility];  Service: Urology;  Laterality: Right;  rad tech ok by vickie at [facility]   No family history on file. History  Substance Use Topics  . Smoking status: Current Every Day Smoker -- 1.00 packs/day for 30 years  . Smokeless tobacco: Never Used  . Alcohol Use: Yes     Comment: OCCASIONAL   OB History   Grav Para Term Preterm Abortions TAB SAB Ect Mult Living                 Review of Systems  HENT: Negative for trouble swallowing.  Respiratory: Negative for shortness of breath.       Allergies  Statins; Ace inhibitors; Amoxicillin; Midazolam hcl; Nsaids; Prednisone; and Sulfa antibiotics  Home Medications   Prior to Admission medications   Medication Sig Start Date End Date Taking? Authorizing Provider  albuterol (PROVENTIL HFA;VENTOLIN HFA) 108 (90 BASE) MCG/ACT inhaler Inhale 2 puffs into the lungs every 6 (six) hours as needed for wheezing or shortness of breath.   Yes Historical Provider, MD  amLODipine (NORVASC) 5 MG tablet Take 5 mg by mouth every morning.     Yes Historical Provider, MD  aspirin EC 325 MG tablet Take 325 mg by mouth daily.   Yes Historical Provider, MD  buPROPion (WELLBUTRIN XL) 150 MG 24 hr tablet Take 150 mg by mouth daily.     Yes Historical Provider, MD  cholecalciferol (VITAMIN D) 1000 UNITS tablet Take 1,000 Units by mouth daily.   Yes Historical Provider,  MD  Cyanocobalamin (B-12 SL) Place 1 tablet under the tongue daily.     Yes Historical Provider, MD  diazepam (VALIUM) 5 MG tablet Take 2.5 mg by mouth 2 (two) times daily.    Yes Historical Provider, MD  DULoxetine (CYMBALTA) 60 MG capsule Take 60 mg by mouth daily.     Yes Historical Provider, MD  esomeprazole (NEXIUM) 40 MG capsule Take 40 mg by mouth daily before breakfast.     Yes Historical Provider, MD  ferrous sulfate 325 (65 FE) MG tablet Take 325 mg by mouth daily with breakfast.   Yes Historical Provider, MD  HYDROcodone-acetaminophen (NORCO) 5-325 MG per tablet Take 1 tablet by mouth 2 (two) times daily as needed for pain.    Yes Historical Provider, MD  ibuprofen (ADVIL,MOTRIN) 200 MG tablet Take 600 mg by mouth every 6 (six) hours as needed for pain.   Yes Historical Provider, MD  magnesium gluconate (MAGONATE) 500 MG tablet Take 500 mg by mouth daily.   Yes Historical Provider, MD  methocarbamol (ROBAXIN) 500 MG tablet Take 500 mg by mouth 3 (three) times daily as needed (for muscle spasms).   Yes Historical Provider, MD  metoprolol tartrate (LOPRESSOR) 25 MG tablet Take 25 mg by mouth 2 (two) times daily.    Yes Historical Provider, MD  traZODone (DESYREL) 50 MG tablet Take 50 mg by mouth at bedtime.     Yes Historical Provider, MD  vitamin C (ASCORBIC ACID) 500 MG tablet Take 500 mg by mouth daily.     Yes Historical Provider, MD   BP 184/79  Pulse 89  Temp(Src) 98.6 F (37 C) (Oral)  Resp 18  Ht 5' (1.524 m)  Wt 151 lb (68.493 kg)  BMI 29.49 kg/m2  SpO2 98%  LMP 09/06/2005 Physical Exam  Nursing note and vitals reviewed. Constitutional: She is oriented to person, place, and time. She appears well-developed and well-nourished. No distress.  HENT:  Head: Normocephalic.  Mouth/Throat: Oropharynx is clear and moist.  Cardiovascular: Normal rate and regular rhythm.   Pulmonary/Chest: Effort normal and breath sounds normal. No stridor. No respiratory distress.  Abdominal:  Soft.  Neurological: She is alert and oriented to person, place, and time.  Skin: Skin is warm and dry.  Multiple erythematous areas to the skin of the feet and arms consistent with history of bee sting. no other rash.   Psychiatric: She has a normal mood and affect. Her behavior is normal.    ED Course  Procedures  COORDINATION OF CARE:  Nursing notes reviewed. Vital signs reviewed.  Initial pt interview and examination performed.   Filed Vitals:   07/08/14 2357  BP: 184/79  Pulse: 89  Temp: 98.6 F (37 C)  TempSrc: Oral  Resp: 18  Height: 5' (1.524 m)  Weight: 151 lb (68.493 kg)  SpO2: 98%    12:20 AM-patient seen and evaluated. Patient with normal respirations O2 sats. Appears uncomfortable due to pain from bee stings. No signs of anaphylactic reaction. Stings occurred 6 hours ago. Patient already took hydrocodone. We'll give dose of morphine IM. She may be discharged home.   Treatment plan initiated: Medications  morphine 4 MG/ML injection 4 mg (not administered)       MDM   Final diagnoses:  Bee sting, accidental or unintentional, initial encounter       Martie Lee, PA-C 07/09/14 [phone]

## 2014-07-10 DIAGNOSIS — J449 Chronic obstructive pulmonary disease, unspecified: Secondary | ICD-10-CM | POA: Diagnosis not present

## 2014-07-10 DIAGNOSIS — T6391XA Toxic effect of contact with unspecified venomous animal, accidental (unintentional), initial encounter: Secondary | ICD-10-CM | POA: Diagnosis not present

## 2014-07-14 DIAGNOSIS — F411 Generalized anxiety disorder: Secondary | ICD-10-CM | POA: Diagnosis not present

## 2014-07-14 DIAGNOSIS — I10 Essential (primary) hypertension: Secondary | ICD-10-CM | POA: Diagnosis not present

## 2014-07-14 DIAGNOSIS — Z23 Encounter for immunization: Secondary | ICD-10-CM | POA: Diagnosis not present

## 2014-07-14 DIAGNOSIS — F329 Major depressive disorder, single episode, unspecified: Secondary | ICD-10-CM | POA: Diagnosis not present

## 2014-07-14 DIAGNOSIS — G4730 Sleep apnea, unspecified: Secondary | ICD-10-CM | POA: Diagnosis not present

## 2014-07-14 DIAGNOSIS — F3289 Other specified depressive episodes: Secondary | ICD-10-CM | POA: Diagnosis not present

## 2014-09-03 ENCOUNTER — Ambulatory Visit
Admission: RE | Admit: 2014-09-03 | Discharge: 2014-09-03 | Disposition: A | Discharge status: Home / Self Care | Payer: Medicare Other | Source: Ambulatory Visit | Attending: Cardiovascular Disease | Admitting: Cardiovascular Disease

## 2014-09-03 ENCOUNTER — Other Ambulatory Visit: Payer: Self-pay | Admitting: Cardiovascular Disease

## 2014-09-03 DIAGNOSIS — R059 Cough, unspecified: Secondary | ICD-10-CM | POA: Diagnosis not present

## 2014-09-03 DIAGNOSIS — F411 Generalized anxiety disorder: Secondary | ICD-10-CM | POA: Diagnosis not present

## 2014-09-03 DIAGNOSIS — J45902 Unspecified asthma with status asthmaticus: Secondary | ICD-10-CM

## 2014-09-03 DIAGNOSIS — J410 Simple chronic bronchitis: Secondary | ICD-10-CM | POA: Diagnosis not present

## 2014-09-03 DIAGNOSIS — E669 Obesity, unspecified: Secondary | ICD-10-CM | POA: Diagnosis not present

## 2014-09-03 DIAGNOSIS — R05 Cough: Secondary | ICD-10-CM

## 2014-09-03 DIAGNOSIS — R079 Chest pain, unspecified: Secondary | ICD-10-CM | POA: Diagnosis not present

## 2014-09-24 DIAGNOSIS — Z113 Encounter for screening for infections with a predominantly sexual mode of transmission: Secondary | ICD-10-CM | POA: Diagnosis not present

## 2014-09-28 DIAGNOSIS — G4733 Obstructive sleep apnea (adult) (pediatric): Secondary | ICD-10-CM | POA: Diagnosis not present

## 2014-10-13 DIAGNOSIS — Z803 Family history of malignant neoplasm of breast: Secondary | ICD-10-CM | POA: Diagnosis not present

## 2014-10-13 DIAGNOSIS — N6001 Solitary cyst of right breast: Secondary | ICD-10-CM | POA: Diagnosis not present

## 2014-10-26 DIAGNOSIS — M5441 Lumbago with sciatica, right side: Secondary | ICD-10-CM | POA: Diagnosis not present

## 2014-10-26 DIAGNOSIS — M4319 Spondylolisthesis, multiple sites in spine: Secondary | ICD-10-CM | POA: Diagnosis not present

## 2014-10-26 DIAGNOSIS — M5136 Other intervertebral disc degeneration, lumbar region: Secondary | ICD-10-CM | POA: Diagnosis not present

## 2014-10-26 DIAGNOSIS — M5442 Lumbago with sciatica, left side: Secondary | ICD-10-CM | POA: Diagnosis not present

## 2014-11-04 DIAGNOSIS — S60222A Contusion of left hand, initial encounter: Secondary | ICD-10-CM | POA: Diagnosis not present

## 2014-11-04 DIAGNOSIS — M79642 Pain in left hand: Secondary | ICD-10-CM | POA: Diagnosis not present

## 2014-11-25 ENCOUNTER — Other Ambulatory Visit: Payer: Self-pay | Admitting: *Deleted

## 2014-11-25 DIAGNOSIS — D72829 Elevated white blood cell count, unspecified: Secondary | ICD-10-CM

## 2014-11-26 ENCOUNTER — Other Ambulatory Visit (HOSPITAL_BASED_OUTPATIENT_CLINIC_OR_DEPARTMENT_OTHER): Payer: Medicare Other

## 2014-11-26 DIAGNOSIS — D72829 Elevated white blood cell count, unspecified: Secondary | ICD-10-CM | POA: Diagnosis not present

## 2014-11-26 LAB — CBC WITH DIFFERENTIAL/PLATELET
BASO%: 0.5 % (ref 0.0–2.0)
Basophils Absolute: 0.1 10*3/uL (ref 0.0–0.1)
EOS%: 3.1 % (ref 0.0–7.0)
Eosinophils Absolute: 0.3 10*3/uL (ref 0.0–0.5)
HEMATOCRIT: 44.3 % (ref 34.8–46.6)
HGB: 14.5 g/dL (ref 11.6–15.9)
LYMPH%: 15.3 % (ref 14.0–49.7)
MCH: 28.8 pg (ref 25.1–34.0)
MCHC: 32.7 g/dL (ref 31.5–36.0)
MCV: 88.1 fL (ref 79.5–101.0)
MONO#: 0.7 10*3/uL (ref 0.1–0.9)
MONO%: 6.2 % (ref 0.0–14.0)
NEUT%: 74.9 % (ref 38.4–76.8)
NEUTROS ABS: 8.2 10*3/uL — AB (ref 1.5–6.5)
PLATELETS: 267 10*3/uL (ref 145–400)
RBC: 5.03 10*6/uL (ref 3.70–5.45)
RDW: 13.3 % (ref 11.2–14.5)
WBC: 11 10*3/uL — AB (ref 3.9–10.3)
lymph#: 1.7 10*3/uL (ref 0.9–3.3)

## 2014-12-03 DIAGNOSIS — I10 Essential (primary) hypertension: Secondary | ICD-10-CM | POA: Diagnosis not present

## 2014-12-03 DIAGNOSIS — E669 Obesity, unspecified: Secondary | ICD-10-CM | POA: Diagnosis not present

## 2014-12-03 DIAGNOSIS — E781 Pure hyperglyceridemia: Secondary | ICD-10-CM | POA: Diagnosis not present

## 2014-12-03 DIAGNOSIS — F419 Anxiety disorder, unspecified: Secondary | ICD-10-CM | POA: Diagnosis not present

## 2015-01-21 DIAGNOSIS — M5136 Other intervertebral disc degeneration, lumbar region: Secondary | ICD-10-CM | POA: Diagnosis not present

## 2015-01-21 DIAGNOSIS — M5442 Lumbago with sciatica, left side: Secondary | ICD-10-CM | POA: Diagnosis not present

## 2015-01-21 DIAGNOSIS — M5441 Lumbago with sciatica, right side: Secondary | ICD-10-CM | POA: Diagnosis not present

## 2015-01-21 DIAGNOSIS — Z139 Encounter for screening, unspecified: Secondary | ICD-10-CM | POA: Diagnosis not present

## 2015-01-21 DIAGNOSIS — M5430 Sciatica, unspecified side: Secondary | ICD-10-CM | POA: Diagnosis not present

## 2015-02-02 DIAGNOSIS — J329 Chronic sinusitis, unspecified: Secondary | ICD-10-CM | POA: Diagnosis not present

## 2015-02-04 DIAGNOSIS — E784 Other hyperlipidemia: Secondary | ICD-10-CM | POA: Diagnosis not present

## 2015-02-04 DIAGNOSIS — J449 Chronic obstructive pulmonary disease, unspecified: Secondary | ICD-10-CM | POA: Diagnosis not present

## 2015-02-04 DIAGNOSIS — F419 Anxiety disorder, unspecified: Secondary | ICD-10-CM | POA: Diagnosis not present

## 2015-02-04 DIAGNOSIS — I10 Essential (primary) hypertension: Secondary | ICD-10-CM | POA: Diagnosis not present

## 2015-02-18 DIAGNOSIS — M5442 Lumbago with sciatica, left side: Secondary | ICD-10-CM | POA: Diagnosis not present

## 2015-02-18 DIAGNOSIS — M5441 Lumbago with sciatica, right side: Secondary | ICD-10-CM | POA: Diagnosis not present

## 2015-02-18 DIAGNOSIS — M4310 Spondylolisthesis, site unspecified: Secondary | ICD-10-CM | POA: Diagnosis not present

## 2015-02-18 DIAGNOSIS — M5136 Other intervertebral disc degeneration, lumbar region: Secondary | ICD-10-CM | POA: Diagnosis not present

## 2015-03-08 DIAGNOSIS — H2513 Age-related nuclear cataract, bilateral: Secondary | ICD-10-CM | POA: Diagnosis not present

## 2015-03-10 DIAGNOSIS — N3010 Interstitial cystitis (chronic) without hematuria: Secondary | ICD-10-CM | POA: Diagnosis not present

## 2015-03-10 DIAGNOSIS — N3941 Urge incontinence: Secondary | ICD-10-CM | POA: Diagnosis not present

## 2015-03-10 DIAGNOSIS — N3944 Nocturnal enuresis: Secondary | ICD-10-CM | POA: Diagnosis not present

## 2015-03-18 DIAGNOSIS — H25812 Combined forms of age-related cataract, left eye: Secondary | ICD-10-CM | POA: Diagnosis not present

## 2015-03-18 DIAGNOSIS — H2511 Age-related nuclear cataract, right eye: Secondary | ICD-10-CM | POA: Diagnosis not present

## 2015-03-31 ENCOUNTER — Telehealth: Payer: Self-pay | Admitting: Internal Medicine

## 2015-03-31 DIAGNOSIS — F330 Major depressive disorder, recurrent, mild: Secondary | ICD-10-CM | POA: Diagnosis not present

## 2015-03-31 DIAGNOSIS — I10 Essential (primary) hypertension: Secondary | ICD-10-CM | POA: Diagnosis not present

## 2015-03-31 DIAGNOSIS — D72829 Elevated white blood cell count, unspecified: Secondary | ICD-10-CM | POA: Diagnosis not present

## 2015-03-31 DIAGNOSIS — E782 Mixed hyperlipidemia: Secondary | ICD-10-CM | POA: Diagnosis not present

## 2015-03-31 DIAGNOSIS — R739 Hyperglycemia, unspecified: Secondary | ICD-10-CM | POA: Diagnosis not present

## 2015-03-31 DIAGNOSIS — E559 Vitamin D deficiency, unspecified: Secondary | ICD-10-CM | POA: Diagnosis not present

## 2015-03-31 NOTE — Telephone Encounter (Signed)
Called patient and she is aware of ehr new dr Julien Nordmann appointment

## 2015-04-05 DIAGNOSIS — H25012 Cortical age-related cataract, left eye: Secondary | ICD-10-CM | POA: Diagnosis not present

## 2015-04-05 DIAGNOSIS — H25812 Combined forms of age-related cataract, left eye: Secondary | ICD-10-CM | POA: Diagnosis not present

## 2015-04-05 DIAGNOSIS — H2512 Age-related nuclear cataract, left eye: Secondary | ICD-10-CM | POA: Diagnosis not present

## 2015-04-15 DIAGNOSIS — Z1231 Encounter for screening mammogram for malignant neoplasm of breast: Secondary | ICD-10-CM | POA: Diagnosis not present

## 2015-04-15 DIAGNOSIS — Z803 Family history of malignant neoplasm of breast: Secondary | ICD-10-CM | POA: Diagnosis not present

## 2015-05-05 DIAGNOSIS — F419 Anxiety disorder, unspecified: Secondary | ICD-10-CM | POA: Diagnosis not present

## 2015-05-05 DIAGNOSIS — J449 Chronic obstructive pulmonary disease, unspecified: Secondary | ICD-10-CM | POA: Diagnosis not present

## 2015-05-05 DIAGNOSIS — I10 Essential (primary) hypertension: Secondary | ICD-10-CM | POA: Diagnosis not present

## 2015-05-05 DIAGNOSIS — F17210 Nicotine dependence, cigarettes, uncomplicated: Secondary | ICD-10-CM | POA: Diagnosis not present

## 2015-05-13 DIAGNOSIS — M5136 Other intervertebral disc degeneration, lumbar region: Secondary | ICD-10-CM | POA: Diagnosis not present

## 2015-05-13 DIAGNOSIS — M4310 Spondylolisthesis, site unspecified: Secondary | ICD-10-CM | POA: Diagnosis not present

## 2015-05-26 ENCOUNTER — Other Ambulatory Visit: Payer: Self-pay | Admitting: Medical Oncology

## 2015-05-26 DIAGNOSIS — D72829 Elevated white blood cell count, unspecified: Secondary | ICD-10-CM

## 2015-05-27 ENCOUNTER — Other Ambulatory Visit: Payer: Medicare Other

## 2015-05-27 ENCOUNTER — Ambulatory Visit: Payer: Medicare Other

## 2015-05-27 ENCOUNTER — Encounter: Payer: Self-pay | Admitting: Internal Medicine

## 2015-05-27 ENCOUNTER — Telehealth: Payer: Self-pay | Admitting: Internal Medicine

## 2015-05-27 ENCOUNTER — Other Ambulatory Visit (HOSPITAL_BASED_OUTPATIENT_CLINIC_OR_DEPARTMENT_OTHER): Payer: Medicare Other

## 2015-05-27 ENCOUNTER — Ambulatory Visit (HOSPITAL_BASED_OUTPATIENT_CLINIC_OR_DEPARTMENT_OTHER): Payer: Medicare Other | Admitting: Internal Medicine

## 2015-05-27 VITALS — BP 124/55 | HR 47 | Temp 97.4°F | Resp 16 | Ht 60.0 in | Wt 150.4 lb

## 2015-05-27 DIAGNOSIS — D72829 Elevated white blood cell count, unspecified: Secondary | ICD-10-CM

## 2015-05-27 LAB — CBC WITH DIFFERENTIAL/PLATELET
BASO%: 1.2 % (ref 0.0–2.0)
Basophils Absolute: 0.2 10*3/uL — ABNORMAL HIGH (ref 0.0–0.1)
EOS%: 3.4 % (ref 0.0–7.0)
Eosinophils Absolute: 0.4 10*3/uL (ref 0.0–0.5)
HCT: 43.9 % (ref 34.8–46.6)
HGB: 14.5 g/dL (ref 11.6–15.9)
LYMPH%: 23.8 % (ref 14.0–49.7)
MCH: 29.1 pg (ref 25.1–34.0)
MCHC: 33.1 g/dL (ref 31.5–36.0)
MCV: 87.8 fL (ref 79.5–101.0)
MONO#: 1 10*3/uL — ABNORMAL HIGH (ref 0.1–0.9)
MONO%: 8.2 % (ref 0.0–14.0)
NEUT#: 7.8 10*3/uL — ABNORMAL HIGH (ref 1.5–6.5)
NEUT%: 63.4 % (ref 38.4–76.8)
Platelets: 285 10*3/uL (ref 145–400)
RBC: 5 10*6/uL (ref 3.70–5.45)
RDW: 13.1 % (ref 11.2–14.5)
WBC: 12.4 10*3/uL — AB (ref 3.9–10.3)
lymph#: 2.9 10*3/uL (ref 0.9–3.3)

## 2015-05-27 LAB — LACTATE DEHYDROGENASE (CC13): LDH: 168 U/L (ref 125–245)

## 2015-05-27 LAB — COMPREHENSIVE METABOLIC PANEL (CC13)
ALBUMIN: 4.1 g/dL (ref 3.5–5.0)
ALT: 24 U/L (ref 0–55)
AST: 26 U/L (ref 5–34)
Alkaline Phosphatase: 86 U/L (ref 40–150)
Anion Gap: 9 mEq/L (ref 3–11)
BILIRUBIN TOTAL: 0.37 mg/dL (ref 0.20–1.20)
BUN: 17.4 mg/dL (ref 7.0–26.0)
CALCIUM: 9.5 mg/dL (ref 8.4–10.4)
CO2: 27 mEq/L (ref 22–29)
Chloride: 105 mEq/L (ref 98–109)
Creatinine: 1.2 mg/dL — ABNORMAL HIGH (ref 0.6–1.1)
EGFR: 51 mL/min/{1.73_m2} — ABNORMAL LOW (ref >90)
GLUCOSE: 116 mg/dL (ref 70–140)
Potassium: 4.2 mEq/L (ref 3.5–5.1)
Sodium: 141 mEq/L (ref 136–145)
Total Protein: 6.9 g/dL (ref 6.4–8.3)

## 2015-05-27 NOTE — Progress Notes (Signed)
[facility] [phone]   [phone]  OFFICE PROGRESS NOTE  WEBB, Valla Leaver, MD [address] [place] [address]  DIAGNOSIS: Persistent leukocytosis questionable to be reactive in nature but myeloproliferative disorder cannot be ruled out at this point.  PRIOR THERAPY: None  CURRENT THERAPY: None  INTERVAL HISTORY: [name] 59 y.o. female returns to the clinic today for annual follow-up visit. The patient was seen by Dr. Juliann Mule last year for evaluation of persistent leukocytosis and it was felt to be reactive in nature secondary to smoking and multiple other medical conditions. She is here today to establish care with me. The patient continues on observation and she came today with repeat CBC for evaluation of her condition. She continues to complain of increasing fatigue and weakness. She did not sleep well last night. The patient also continues to smoke. She has history of depression and fibromyalgia. She denied having any significant weight loss or night sweats. The patient has no chest pain, shortness of breath, cough or hemoptysis. She has no nausea or vomiting, no fever or chills.  MEDICAL HISTORY: Past Medical History  Diagnosis Date  . Hypertension   . Coronary artery disease CARDIOLOGIST- DR  Doylene Canard  (VISIT 03-30-11 W/ CHART)    NON-OBS. CAD   (STRESS TEST NOV. 2011  . Asthma   . Non-productive cough   . Numbness in both hands AT TIMES  . DJD (degenerative joint disease)     JOINT PAIN  . DDD (degenerative disc disease)   . Fibromyalgia   . Depression   . Anxiety   . GERD (gastroesophageal reflux disease) AND HIATIAL HERNIA    CONTROLLED W/ NEXIUM  . History of gastric ulcer 2004  . IBS (irritable bowel syndrome)   . Hemorrhoids   . Urge urinary incontinence   . Frequency of urination   . Nocturia   . Iron deficiency anemia   . Agoraphobia without history of panic disorder   . Insomnia   . Incisional  pain s/p interstim implant 1st stage--- 12-07-11     left upper buttock-- pt states dressing clean dry and intact (on 12-08-11)  . OSA on CPAP     ALLERGIES:  is allergic to statins; ace inhibitors; amoxicillin; midazolam hcl; nsaids; prednisone; and sulfa antibiotics.  MEDICATIONS:  Current Outpatient Prescriptions  Medication Sig Dispense Refill  . albuterol (PROVENTIL HFA;VENTOLIN HFA) 108 (90 BASE) MCG/ACT inhaler Inhale 2 puffs into the lungs every 6 (six) hours as needed for wheezing or shortness of breath.    Marland Kitchen amLODipine (NORVASC) 5 MG tablet Take 5 mg by mouth every morning.      Marland Kitchen aspirin EC 325 MG tablet Take 325 mg by mouth daily.    Marland Kitchen buPROPion (WELLBUTRIN XL) 150 MG 24 hr tablet Take 150 mg by mouth daily.      . cholecalciferol (VITAMIN D) 1000 UNITS tablet Take 1,000 Units by mouth daily.    . clonazePAM (KLONOPIN) 1 MG tablet Take 1 mg by mouth 2 (two) times daily.  5  . DULoxetine (CYMBALTA) 60 MG capsule Take 60 mg by mouth daily.      . fenofibrate (TRICOR) 48 MG tablet Take 48 mg by mouth daily.  6  . ferrous sulfate 325 (65 FE) MG tablet Take 325 mg by mouth daily with breakfast.    . HYDROcodone-acetaminophen (NORCO) 5-325 MG per tablet Take 1 tablet by mouth 2 (two) times daily as  needed for pain.     . methocarbamol (ROBAXIN) 500 MG tablet Take 500 mg by mouth 3 (three) times daily as needed (for muscle spasms).    . metoprolol tartrate (LOPRESSOR) 25 MG tablet Take 25 mg by mouth 2 (two) times daily.     Marland Kitchen ofloxacin (OCUFLOX) 0.3 % ophthalmic solution 1 DROP IN SURGICAL EYE FOUR TIMES A DAY START THE SATURDAY, SUNDAY,AND MONDAY BEFORE SURGERY  1  . prednisoLONE acetate (PRED FORTE) 1 % ophthalmic suspension 1 DROP IN SURGICAL EYE FOUR TIMES A DAY AFTER SURGERY  1  . traZODone (DESYREL) 50 MG tablet Take 50 mg by mouth at bedtime.      . vitamin C (ASCORBIC ACID) 500 MG tablet Take 500 mg by mouth daily.      Marland Kitchen ibuprofen (ADVIL,MOTRIN) 200 MG tablet Take 600 mg by  mouth every 6 (six) hours as needed for pain.    . magnesium gluconate (MAGONATE) 500 MG tablet Take 500 mg by mouth daily.     No current facility-administered medications for this visit.    SURGICAL HISTORY:  Past Surgical History  Procedure Laterality Date  . Cardiac catheterization  2003  . Cysto/ hod/ bladder bx/ fulgeration  06-12-2011  . Cervical fusion  2008    C4 - T1  . Laminectomy  2007    L3 - 5  . Cysto/ hod/ bladder bx  2006  . Hysteroscopy w/d&c  2005  . Cervical discectomy  2002    C4 - 7  . Right thumb surg.  2001  . Cholecystectomy  1982  . Tonsillectomy and adenoidectomy  1965  . Interstim implant placement  12/07/2011    Procedure: INTERSTIM IMPLANT FIRST STAGE;  Surgeon: [name], MD;  Location: [facility];  Service: Urology;  Laterality: Right;  . Interstim implant placement  12/14/2011    Procedure: INTERSTIM IMPLANT SECOND STAGE;  Surgeon: [name], MD;  Location: [facility];  Service: Urology;  Laterality: Right;  rad tech ok by vickie at [facility]    REVIEW OF SYSTEMS:  Constitutional: positive for fatigue Eyes: negative Ears, nose, mouth, throat, and face: negative Respiratory: negative Cardiovascular: negative Gastrointestinal: negative Genitourinary:negative Integument/breast: negative Hematologic/lymphatic: negative Musculoskeletal:negative Neurological: negative Behavioral/Psych: positive for depression and sleep disturbance Endocrine: negative Allergic/Immunologic: negative   PHYSICAL EXAMINATION: General appearance: alert, cooperative, fatigued and no distress Head: Normocephalic, without obvious abnormality, atraumatic Neck: no adenopathy, no JVD, supple, symmetrical, trachea midline and thyroid not enlarged, symmetric, no tenderness/mass/nodules Lymph nodes: Cervical, supraclavicular, and axillary nodes normal. Resp: clear to auscultation bilaterally Back: symmetric, no curvature. ROM  normal. No CVA tenderness. Cardio: regular rate and rhythm, S1, S2 normal, no murmur, click, rub or gallop GI: soft, non-tender; bowel sounds normal; no masses,  no organomegaly Extremities: extremities normal, atraumatic, no cyanosis or edema Neurologic: Alert and oriented X 3, normal strength and tone. Normal symmetric reflexes. Normal coordination and gait  ECOG PERFORMANCE STATUS: 1 - Symptomatic but completely ambulatory  Blood pressure 124/55, pulse 47, temperature 97.4 F (36.3 C), temperature source Oral, resp. rate 16, height 5' (1.524 m), weight 150 lb 6.4 oz (68.221 kg), last menstrual period 09/06/2005, SpO2 96 %.  LABORATORY DATA: Lab Results  Component Value Date   WBC 12.4* 05/27/2015   HGB 14.5 05/27/2015   HCT 43.9 05/27/2015   MCV 87.8 05/27/2015   PLT 285 05/27/2015      Chemistry      Component Value Date/Time  NA 140 05/26/2014 1039   NA 142 12/14/2011 0722   K 3.5 05/26/2014 1039   K 3.6 12/14/2011 0722   CL 103 12/07/2011 1033   CO2 29 05/26/2014 1039   CO2 29 07/23/2007 1203   BUN 12.9 05/26/2014 1039   BUN 11 12/07/2011 1033   CREATININE 0.9 05/26/2014 1039   CREATININE 1.00 12/07/2011 1033      Component Value Date/Time   CALCIUM 9.4 05/26/2014 1039   CALCIUM 10.4 07/23/2007 1203   ALKPHOS 113 05/26/2014 1039   AST 21 05/26/2014 1039   ALT 17 05/26/2014 1039   BILITOT 0.27 05/26/2014 1039       RADIOGRAPHIC STUDIES: No results found.  ASSESSMENT AND PLAN: This is a very pleasant 59 years old white female with persistent leukocytosis of unclear etiology at this point questionable to be reactive in nature but myeloproliferative disorder cannot be excluded. I had a lengthy discussion with the patient today about her condition. I recommended for her to have molecular study for BCR/ABL as well as JAK-2 mutation to rule out the possibility of myeloproliferative disorder. I will see the patient back for follow-up visit in 3 months for  reevaluation and repeat CBC. She will continue on observation for now. The patient was advised to call immediately if she has any concerning symptoms in the interval. The patient voices understanding of current disease status and treatment options and is in agreement with the current care plan.  All questions were answered. The patient knows to call the clinic with any problems, questions or concerns. We can certainly see the patient much sooner if necessary.  I spent 15 minutes counseling the patient face to face. The total time spent in the appointment was 25 minutes.  Disclaimer: This note was dictated with voice recognition software. Similar sounding words can inadvertently be transcribed and may not be corrected upon review.

## 2015-05-27 NOTE — Telephone Encounter (Signed)
per pof to sch pt appt-gave pt copy of avs-sent back to [facility] 

## 2015-06-03 ENCOUNTER — Telehealth: Payer: Self-pay

## 2015-06-03 NOTE — Telephone Encounter (Signed)
Pt called stating she did not understand the concept of the genetic testing and the blood tests that Dr Julien Nordmann ordered on 6/9. What are they looking for?

## 2015-06-03 NOTE — Telephone Encounter (Signed)
Explained myeloprolifertive disease. Explained Jak 2 and brc/abl were negative. Pt is still under observation.

## 2015-06-24 ENCOUNTER — Telehealth: Payer: Self-pay | Admitting: *Deleted

## 2015-06-24 NOTE — Telephone Encounter (Signed)
Called [facility] lab as requested in voicemail from [place].  Codes for date of service Jun 16, 2015 for JAK2, BCR - ABL tests requested.  Leucocytosis ICD-10 code D72.829 left in voicemail.  Requested return call if any questions.

## 2015-07-08 DIAGNOSIS — M25561 Pain in right knee: Secondary | ICD-10-CM | POA: Diagnosis not present

## 2015-07-08 DIAGNOSIS — I10 Essential (primary) hypertension: Secondary | ICD-10-CM | POA: Diagnosis not present

## 2015-07-08 DIAGNOSIS — J449 Chronic obstructive pulmonary disease, unspecified: Secondary | ICD-10-CM | POA: Diagnosis not present

## 2015-07-08 DIAGNOSIS — M25559 Pain in unspecified hip: Secondary | ICD-10-CM | POA: Diagnosis not present

## 2015-08-12 DIAGNOSIS — M4316 Spondylolisthesis, lumbar region: Secondary | ICD-10-CM | POA: Diagnosis not present

## 2015-08-12 DIAGNOSIS — M5136 Other intervertebral disc degeneration, lumbar region: Secondary | ICD-10-CM | POA: Diagnosis not present

## 2015-08-12 DIAGNOSIS — Z79891 Long term (current) use of opiate analgesic: Secondary | ICD-10-CM | POA: Diagnosis not present

## 2015-08-26 ENCOUNTER — Telehealth: Payer: Self-pay | Admitting: Internal Medicine

## 2015-08-26 NOTE — Telephone Encounter (Signed)
sw. pt and r/s appt due to MD out of the office...pt ok and aware of new d.t °

## 2015-08-30 ENCOUNTER — Other Ambulatory Visit: Payer: Medicare Other

## 2015-08-30 ENCOUNTER — Ambulatory Visit: Payer: Medicare Other | Admitting: Internal Medicine

## 2015-09-02 ENCOUNTER — Ambulatory Visit (HOSPITAL_BASED_OUTPATIENT_CLINIC_OR_DEPARTMENT_OTHER): Payer: Medicare Other | Admitting: Physician Assistant

## 2015-09-02 ENCOUNTER — Telehealth: Payer: Self-pay | Admitting: Internal Medicine

## 2015-09-02 ENCOUNTER — Encounter: Payer: Self-pay | Admitting: Physician Assistant

## 2015-09-02 ENCOUNTER — Other Ambulatory Visit (HOSPITAL_BASED_OUTPATIENT_CLINIC_OR_DEPARTMENT_OTHER): Payer: Medicare Other

## 2015-09-02 VITALS — BP 159/72 | HR 79 | Temp 98.3°F | Resp 18 | Ht 60.0 in | Wt 149.7 lb

## 2015-09-02 DIAGNOSIS — D72829 Elevated white blood cell count, unspecified: Secondary | ICD-10-CM | POA: Diagnosis not present

## 2015-09-02 LAB — CBC WITH DIFFERENTIAL/PLATELET
BASO%: 0.4 % (ref 0.0–2.0)
Basophils Absolute: 0.1 10*3/uL (ref 0.0–0.1)
EOS%: 2.8 % (ref 0.0–7.0)
Eosinophils Absolute: 0.4 10*3/uL (ref 0.0–0.5)
HEMATOCRIT: 46.3 % (ref 34.8–46.6)
HGB: 15.4 g/dL (ref 11.6–15.9)
LYMPH#: 2.3 10*3/uL (ref 0.9–3.3)
LYMPH%: 17.3 % (ref 14.0–49.7)
MCH: 29.8 pg (ref 25.1–34.0)
MCHC: 33.3 g/dL (ref 31.5–36.0)
MCV: 89.6 fL (ref 79.5–101.0)
MONO#: 0.9 10*3/uL (ref 0.1–0.9)
MONO%: 6.7 % (ref 0.0–14.0)
NEUT#: 9.9 10*3/uL — ABNORMAL HIGH (ref 1.5–6.5)
NEUT%: 72.8 % (ref 38.4–76.8)
PLATELETS: 255 10*3/uL (ref 145–400)
RBC: 5.17 10*6/uL (ref 3.70–5.45)
RDW: 12.8 % (ref 11.2–14.5)
WBC: 13.5 10*3/uL — ABNORMAL HIGH (ref 3.9–10.3)

## 2015-09-02 NOTE — Progress Notes (Addendum)
[facility] [phone]   [phone]) [phone]  OFFICE PROGRESS NOTE  WEBB, Valla Leaver, MD [address] [place] [address]  DIAGNOSIS: Persistent leukocytosis questionable to be reactive in nature but myeloproliferative disorder cannot be ruled out at this point.  PRIOR THERAPY: None  CURRENT THERAPY: None  INTERVAL HISTORY: [name] 59 y.o. female returns to the clinic today for annual follow-up visit. The patient was seen by Dr. Juliann Mule last year for evaluation of persistent leukocytosis and it was felt to be reactive in nature secondary to smoking and multiple other medical conditions. She established care with Dr. Julien Nordmann in June 2016. She had further tests done to evaluate her leukocytosis and she is here to discuss the results.She continues to complain of increasing fatigue and weakness. The patient also continues to smoke. She has history of depression and fibromyalgia. She denied having any significant weight loss or night sweats. The patient has no chest pain, shortness of breath, cough or hemoptysis. She has no nausea or vomiting, no fever or chills. Her JAK2 and BCR/ABL were both negative.  MEDICAL HISTORY: Past Medical History  Diagnosis Date  . Hypertension   . Coronary artery disease CARDIOLOGIST- DR  Doylene Canard  (VISIT 03-30-11 W/ CHART)    NON-OBS. CAD   (STRESS TEST NOV. 2011  . Asthma   . Non-productive cough   . Numbness in both hands AT TIMES  . DJD (degenerative joint disease)     JOINT PAIN  . DDD (degenerative disc disease)   . Fibromyalgia   . Depression   . Anxiety   . GERD (gastroesophageal reflux disease) AND HIATIAL HERNIA    CONTROLLED W/ NEXIUM  . History of gastric ulcer 2004  . IBS (irritable bowel syndrome)   . Hemorrhoids   . Urge urinary incontinence   . Frequency of urination   . Nocturia   . Iron deficiency anemia   . Agoraphobia without history of panic disorder   . Insomnia   .  Incisional pain s/p interstim implant 1st stage--- 12-07-11     left upper buttock-- pt states dressing clean dry and intact (on 12-08-11)  . OSA on CPAP     ALLERGIES:  is allergic to statins; ace inhibitors; amoxicillin; midazolam hcl; nsaids; prednisone; and sulfa antibiotics.  MEDICATIONS:  Current Outpatient Prescriptions  Medication Sig Dispense Refill  . albuterol (PROVENTIL HFA;VENTOLIN HFA) 108 (90 BASE) MCG/ACT inhaler Inhale 2 puffs into the lungs every 6 (six) hours as needed for wheezing or shortness of breath.    Marland Kitchen amLODipine (NORVASC) 5 MG tablet Take 5 mg by mouth every morning.      Marland Kitchen aspirin EC 325 MG tablet Take 325 mg by mouth daily.    Marland Kitchen buPROPion (WELLBUTRIN XL) 150 MG 24 hr tablet Take 150 mg by mouth daily.      . cholecalciferol (VITAMIN D) 1000 UNITS tablet Take 1,000 Units by mouth daily.    . clonazePAM (KLONOPIN) 1 MG tablet Take 1 mg by mouth 2 (two) times daily.  5  . DULoxetine (CYMBALTA) 60 MG capsule Take 60 mg by mouth daily.      . fenofibrate (TRICOR) 48 MG tablet Take 48 mg by mouth daily.  6  . ferrous sulfate 325 (65 FE) MG tablet Take 325 mg by mouth daily with breakfast.    . HYDROcodone-acetaminophen (NORCO) 5-325 MG per tablet Take 1 tablet by mouth 2 (two) times daily as needed  for pain.     Marland Kitchen ibuprofen (ADVIL,MOTRIN) 200 MG tablet Take 600 mg by mouth every 6 (six) hours as needed for pain.    . magnesium gluconate (MAGONATE) 500 MG tablet Take 500 mg by mouth daily.    . methocarbamol (ROBAXIN) 500 MG tablet Take 500 mg by mouth 3 (three) times daily as needed (for muscle spasms).    . metoprolol tartrate (LOPRESSOR) 25 MG tablet Take 25 mg by mouth 2 (two) times daily.     Marland Kitchen ofloxacin (OCUFLOX) 0.3 % ophthalmic solution 1 DROP IN SURGICAL EYE FOUR TIMES A DAY START THE SATURDAY, SUNDAY,AND MONDAY BEFORE SURGERY  1  . prednisoLONE acetate (PRED FORTE) 1 % ophthalmic suspension 1 DROP IN SURGICAL EYE FOUR TIMES A DAY AFTER SURGERY  1  .  traZODone (DESYREL) 50 MG tablet Take 50 mg by mouth at bedtime.      . vitamin C (ASCORBIC ACID) 500 MG tablet Take 500 mg by mouth daily.       No current facility-administered medications for this visit.    SURGICAL HISTORY:  Past Surgical History  Procedure Laterality Date  . Cardiac catheterization  2003  . Cysto/ hod/ bladder bx/ fulgeration  06-12-2011  . Cervical fusion  2008    C4 - T1  . Laminectomy  2007    L3 - 5  . Cysto/ hod/ bladder bx  2006  . Hysteroscopy w/d&c  2005  . Cervical discectomy  2002    C4 - 7  . Right thumb surg.  2001  . Cholecystectomy  1982  . Tonsillectomy and adenoidectomy  1965  . Interstim implant placement  12/07/2011    Procedure: INTERSTIM IMPLANT FIRST STAGE;  Surgeon: [name], MD;  Location: [facility];  Service: Urology;  Laterality: Right;  . Interstim implant placement  12/14/2011    Procedure: INTERSTIM IMPLANT SECOND STAGE;  Surgeon: [name], MD;  Location: [facility];  Service: Urology;  Laterality: Right;  rad tech ok by vickie at main    REVIEW OF SYSTEMS:  Constitutional: positive for fatigue Eyes: negative Ears, nose, mouth, throat, and face: negative Respiratory: negative Cardiovascular: negative Gastrointestinal: negative Genitourinary:negative Integument/breast: negative Hematologic/lymphatic: negative Musculoskeletal:negative Neurological: negative Behavioral/Psych: positive for depression and sleep disturbance Endocrine: negative Allergic/Immunologic: negative   PHYSICAL EXAMINATION: General appearance: alert, cooperative, fatigued and no distress Head: Normocephalic, without obvious abnormality, atraumatic Neck: no adenopathy, no JVD, supple, symmetrical, trachea midline and thyroid not enlarged, symmetric, no tenderness/mass/nodules Lymph nodes: Cervical, supraclavicular, and axillary nodes normal. Resp: clear to auscultation bilaterally Back: symmetric, no  curvature. ROM normal. No CVA tenderness. Cardio: regular rate and rhythm, S1, S2 normal, no murmur, click, rub or gallop GI: soft, non-tender; bowel sounds normal; no masses,  no organomegaly Extremities: extremities normal, atraumatic, no cyanosis or edema Neurologic: Alert and oriented X 3, normal strength and tone. Normal symmetric reflexes. Normal coordination and gait  ECOG PERFORMANCE STATUS: 1 - Symptomatic but completely ambulatory  Blood pressure 159/72, pulse 79, temperature 98.3 F (36.8 C), temperature source Oral, resp. rate 18, height 5' (1.524 m), weight 149 lb 11.2 oz (67.903 kg), last menstrual period 09/06/2005, SpO2 97 %.  LABORATORY DATA: Lab Results  Component Value Date   WBC 13.5* 09/02/2015   HGB 15.4 09/02/2015   HCT 46.3 09/02/2015   MCV 89.6 09/02/2015   PLT 255 09/02/2015      Chemistry      Component Value Date/Time   NA  141 05/27/2015 0906   NA 142 12/14/2011 0722   K 4.2 05/27/2015 0906   K 3.6 12/14/2011 0722   CL 103 12/07/2011 1033   CO2 27 05/27/2015 0906   CO2 29 07/23/2007 1203   BUN 17.4 05/27/2015 0906   BUN 11 12/07/2011 1033   CREATININE 1.2* 05/27/2015 0906   CREATININE 1.00 12/07/2011 1033      Component Value Date/Time   CALCIUM 9.5 05/27/2015 0906   CALCIUM 10.4 07/23/2007 1203   ALKPHOS 86 05/27/2015 0906   AST 26 05/27/2015 0906   ALT 24 05/27/2015 0906   BILITOT 0.37 05/27/2015 0906       RADIOGRAPHIC STUDIES: No results found.  ASSESSMENT AND PLAN: This is a very pleasant 59 years old white female with persistent leukocytosis of unclear etiology at this point questionable to be reactive in nature but myeloproliferative disorder cannot be excluded. The  molecular study for BCR/ABL as well as JAK-2 mutation were both negative. The patient was discussed with and also seen by Dr. Mohamed. The patient will be scheduled for a bone marrow biopsy for further evaluation of her leukocytosis. This will be scheduled within 2  weeks and she will follow up with Dr. Mohamed in one month to discuss the results of the bone marrow biopsy and possible treatment options. She will continue on observation for now. The patient was advised to call immediately if she has any concerning symptoms in the interval. The patient voices understanding of current disease status and treatment options and is in agreement with the current care plan.  All questions were answered. The patient knows to call the clinic with any problems, questions or concerns. We can certainly see the patient much sooner if necessary.  JOHNSON, ADRENA E, PA-C 09/02/2015  ADDENDUM: Hematology/Oncology Attending: I had a face to face encounter with the patient. I recommended her care plan. This is a very pleasant 59 years old female with persistent leukocytosis of unclear etiology. The previous molecular studies showed no evidence for myeloproliferative disorder. I recommended for the patient to proceed with a bone marrow biopsy and aspirate for further evaluation of her disease. She would come back for follow-up visit in 4 weeks for reevaluation and discussion of her biopsy results. The patient was advised to call immediately if she has any concerning symptoms in the interval.  Disclaimer: This note was dictated with voice recognition software. Similar sounding words can inadvertently be transcribed and may not be corrected upon review. MOHAMED,MOHAMED K., MD 09/11/2015        

## 2015-09-02 NOTE — Telephone Encounter (Signed)
Gave and printed appt sched and avs fo rpt for OCT °

## 2015-09-08 NOTE — Patient Instructions (Signed)
You will be scheduled for a bone marrow biopsy to further evaluate your elevated white blood cell count. Follow up in one month

## 2015-09-09 DIAGNOSIS — I2510 Atherosclerotic heart disease of native coronary artery without angina pectoris: Secondary | ICD-10-CM | POA: Diagnosis not present

## 2015-09-09 DIAGNOSIS — F17210 Nicotine dependence, cigarettes, uncomplicated: Secondary | ICD-10-CM | POA: Diagnosis not present

## 2015-09-09 DIAGNOSIS — E663 Overweight: Secondary | ICD-10-CM | POA: Diagnosis not present

## 2015-09-09 DIAGNOSIS — J449 Chronic obstructive pulmonary disease, unspecified: Secondary | ICD-10-CM | POA: Diagnosis not present

## 2015-09-09 DIAGNOSIS — R42 Dizziness and giddiness: Secondary | ICD-10-CM | POA: Diagnosis not present

## 2015-09-13 ENCOUNTER — Other Ambulatory Visit: Payer: Self-pay | Admitting: Radiology

## 2015-09-14 ENCOUNTER — Other Ambulatory Visit: Payer: Self-pay | Admitting: Radiology

## 2015-09-15 ENCOUNTER — Ambulatory Visit (HOSPITAL_COMMUNITY)
Admission: RE | Admit: 2015-09-15 | Discharge: 2015-09-15 | Disposition: A | Discharge status: Home / Self Care | Payer: Medicare Other | Source: Ambulatory Visit | Attending: Physician Assistant | Admitting: Physician Assistant

## 2015-09-15 ENCOUNTER — Encounter (HOSPITAL_COMMUNITY): Payer: Self-pay

## 2015-09-15 DIAGNOSIS — D72829 Elevated white blood cell count, unspecified: Secondary | ICD-10-CM

## 2015-09-15 DIAGNOSIS — F419 Anxiety disorder, unspecified: Secondary | ICD-10-CM | POA: Diagnosis not present

## 2015-09-15 DIAGNOSIS — Z79899 Other long term (current) drug therapy: Secondary | ICD-10-CM | POA: Insufficient documentation

## 2015-09-15 DIAGNOSIS — I10 Essential (primary) hypertension: Secondary | ICD-10-CM | POA: Diagnosis not present

## 2015-09-15 DIAGNOSIS — F17210 Nicotine dependence, cigarettes, uncomplicated: Secondary | ICD-10-CM | POA: Insufficient documentation

## 2015-09-15 DIAGNOSIS — I2510 Atherosclerotic heart disease of native coronary artery without angina pectoris: Secondary | ICD-10-CM | POA: Insufficient documentation

## 2015-09-15 DIAGNOSIS — F329 Major depressive disorder, single episode, unspecified: Secondary | ICD-10-CM | POA: Insufficient documentation

## 2015-09-15 DIAGNOSIS — K219 Gastro-esophageal reflux disease without esophagitis: Secondary | ICD-10-CM | POA: Diagnosis not present

## 2015-09-15 DIAGNOSIS — Z882 Allergy status to sulfonamides status: Secondary | ICD-10-CM | POA: Diagnosis not present

## 2015-09-15 DIAGNOSIS — M797 Fibromyalgia: Secondary | ICD-10-CM | POA: Diagnosis not present

## 2015-09-15 DIAGNOSIS — G4733 Obstructive sleep apnea (adult) (pediatric): Secondary | ICD-10-CM | POA: Insufficient documentation

## 2015-09-15 LAB — CBC WITH DIFFERENTIAL/PLATELET
Basophils Absolute: 0.1 10*3/uL (ref 0.0–0.1)
Basophils Relative: 1 %
EOS ABS: 0.3 10*3/uL (ref 0.0–0.7)
EOS PCT: 2 %
HCT: 44.3 % (ref 36.0–46.0)
Hemoglobin: 14.9 g/dL (ref 12.0–15.0)
LYMPHS ABS: 2.1 10*3/uL (ref 0.7–4.0)
Lymphocytes Relative: 16 %
MCH: 29.9 pg (ref 26.0–34.0)
MCHC: 33.6 g/dL (ref 30.0–36.0)
MCV: 89 fL (ref 78.0–100.0)
MONOS PCT: 6 %
Monocytes Absolute: 0.8 10*3/uL (ref 0.1–1.0)
Neutro Abs: 9.5 10*3/uL — ABNORMAL HIGH (ref 1.7–7.7)
Neutrophils Relative %: 75 %
PLATELETS: 293 10*3/uL (ref 150–400)
RBC: 4.98 MIL/uL (ref 3.87–5.11)
RDW: 12.9 % (ref 11.5–15.5)
WBC: 12.8 10*3/uL — AB (ref 4.0–10.5)

## 2015-09-15 LAB — PROTIME-INR
INR: 0.93 (ref 0.00–1.49)
PROTHROMBIN TIME: 12.7 s (ref 11.6–15.2)

## 2015-09-15 LAB — APTT: aPTT: 30 seconds (ref 24–37)

## 2015-09-15 LAB — BONE MARROW EXAM

## 2015-09-15 MED ORDER — MIDAZOLAM HCL 2 MG/2ML IJ SOLN
INTRAMUSCULAR | Status: AC
  Filled 2015-09-15: qty 4

## 2015-09-15 MED ORDER — FENTANYL CITRATE (PF) 100 MCG/2ML IJ SOLN
INTRAMUSCULAR | Status: AC
  Filled 2015-09-15: qty 2

## 2015-09-15 MED ORDER — SODIUM CHLORIDE 0.9 % IV SOLN
INTRAVENOUS | Status: DC
Start: 2015-09-15 — End: 2015-09-16
  Administered 2015-09-15: 08:00:00 via INTRAVENOUS

## 2015-09-15 MED ORDER — HYDROCODONE-ACETAMINOPHEN 5-325 MG PO TABS: 1.0000 | ORAL_TABLET | ORAL | Status: DC | PRN

## 2015-09-15 MED ORDER — FENTANYL CITRATE (PF) 100 MCG/2ML IJ SOLN
INTRAMUSCULAR | Status: AC | PRN
  Administered 2015-09-15: 100 ug via INTRAVENOUS
  Administered 2015-09-15: 50 ug via INTRAVENOUS

## 2015-09-15 NOTE — H&P (Signed)
Chief Complaint: Patient was seen in consultation today for CT guided bone marrow biopsy  Referring Physician(s): Mohamed,M  History of Present Illness: [name] is a 60 y.o. female with history of persistent leukocytosis of uncertain etiology who presents today for CT guided bone marrow biopsy for further evaluation.   Past Medical History  Diagnosis Date  . Hypertension   . Coronary artery disease CARDIOLOGIST- DR  Doylene Canard  (VISIT 03-30-11 W/ CHART)    NON-OBS. CAD   (STRESS TEST NOV. 2011  . Asthma   . Non-productive cough   . Numbness in both hands AT TIMES  . DJD (degenerative joint disease)     JOINT PAIN  . DDD (degenerative disc disease)   . Fibromyalgia   . Depression   . Anxiety   . GERD (gastroesophageal reflux disease) AND HIATIAL HERNIA    CONTROLLED W/ NEXIUM  . History of gastric ulcer 2004  . IBS (irritable bowel syndrome)   . Hemorrhoids   . Urge urinary incontinence   . Frequency of urination   . Nocturia   . Iron deficiency anemia   . Agoraphobia without history of panic disorder   . Insomnia   . Incisional pain s/p interstim implant 1st stage--- 12-07-11     left upper buttock-- pt states dressing clean dry and intact (on 12-08-11)  . OSA on CPAP     Past Surgical History  Procedure Laterality Date  . Cardiac catheterization  2003  . Cysto/ hod/ bladder bx/ fulgeration  06-12-2011  . Cervical fusion  2008    C4 - T1  . Laminectomy  2007    L3 - 5  . Cysto/ hod/ bladder bx  2006  . Hysteroscopy w/d&c  2005  . Cervical discectomy  2002    C4 - 7  . Right thumb surg.  2001  . Cholecystectomy  1982  . Tonsillectomy and adenoidectomy  1965  . Interstim implant placement  12/07/2011    Procedure: INTERSTIM IMPLANT FIRST STAGE;  Surgeon: [name], MD;  Location: [facility];  Service: Urology;  Laterality: Right;  . Interstim implant placement  12/14/2011    Procedure: INTERSTIM IMPLANT SECOND STAGE;  Surgeon:  [name], MD;  Location: [facility];  Service: Urology;  Laterality: Right;  rad tech ok by vickie at [facility]  . Wrist surgery Left     [facility]    Allergies: Statins; Ace inhibitors; Amoxicillin; Midazolam hcl; Nsaids; Prednisone; and Sulfa antibiotics  Medications: Prior to Admission medications   Medication Sig Start Date End Date Taking? Authorizing Provider  albuterol (PROVENTIL HFA;VENTOLIN HFA) 108 (90 BASE) MCG/ACT inhaler Inhale 2 puffs into the lungs every 6 (six) hours as needed for wheezing or shortness of breath.   Yes Historical Provider, MD  amLODipine (NORVASC) 5 MG tablet Take 5 mg by mouth every morning.     Yes Historical Provider, MD  buPROPion (WELLBUTRIN XL) 150 MG 24 hr tablet Take 150 mg by mouth at bedtime.    Yes Historical Provider, MD  cholecalciferol (VITAMIN D) 1000 UNITS tablet Take 1,000 Units by mouth every morning.    Yes Historical Provider, MD  clonazePAM (KLONOPIN) 1 MG tablet Take 1 mg by mouth 2 (two) times daily. 05/10/15  Yes Historical Provider, MD  DULoxetine (CYMBALTA) 60 MG capsule Take 60 mg by mouth at bedtime.    Yes Historical Provider, MD  fenofibrate (TRICOR) 48 MG tablet Take 48 mg by mouth  every morning.  04/30/15  Yes Historical Provider, MD  ferrous sulfate 325 (65 FE) MG tablet Take 325 mg by mouth daily with breakfast.   Yes Historical Provider, MD  ibuprofen (ADVIL,MOTRIN) 200 MG tablet Take 600 mg by mouth every 6 (six) hours as needed for pain.   Yes Historical Provider, MD  methocarbamol (ROBAXIN) 500 MG tablet Take 500 mg by mouth 3 (three) times daily as needed (for muscle spasms).   Yes Historical Provider, MD  metoprolol tartrate (LOPRESSOR) 25 MG tablet Take 12.5 mg by mouth 2 (two) times daily.    Yes Historical Provider, MD  traZODone (DESYREL) 50 MG tablet Take 50 mg by mouth at bedtime.     Yes Historical Provider, MD  vitamin B-12 (CYANOCOBALAMIN) 100 MCG tablet Take 100 mcg by mouth daily. Super B12  complex   Yes Historical Provider, MD  vitamin C (ASCORBIC ACID) 500 MG tablet Take 500 mg by mouth every morning.    Yes Historical Provider, MD  aspirin EC 325 MG tablet Take 325 mg by mouth every morning.     Historical Provider, MD  HYDROcodone-acetaminophen (NORCO) 5-325 MG per tablet Take 1 tablet by mouth 2 (two) times daily as needed for pain.     Historical Provider, MD  [place] Stem therapy for overactive bladder implant on the right side.    Historical Provider, MD     No family history on file.  Social History   Social History  . Marital Status: Widowed    Spouse Name: N/A  . Number of Children: N/A  . Years of Education: N/A   Social History Main Topics  . Smoking status: Current Every Day Smoker -- 2.00 packs/day for 30 years  . Smokeless tobacco: Never Used  . Alcohol Use: Yes     Comment: OCCASIONAL  . Drug Use: No  . Sexual Activity: Not on file   Other Topics Concern  . Not on file   Social History Narrative      Review of Systems   Constitutional: Positive for fatigue. Negative for fever and chills.  Respiratory: Positive for cough and shortness of breath.   Cardiovascular: Positive for chest pain.  Gastrointestinal: Positive for abdominal pain. Negative for blood in stool.       Occ nausea/vomiting  Genitourinary: Negative for dysuria and hematuria.  Musculoskeletal: Positive for back pain.  Neurological: Positive for headaches.    Vital Signs: BP 157/82 mmHg  Pulse 81  Temp(Src) 98.2 F (36.8 C) (Oral)  Resp 16  SpO2 93%  LMP 09/06/2005  Physical Exam  Constitutional: She is oriented to person, place, and time. She appears well-developed and well-nourished.  Cardiovascular: Normal rate and regular rhythm.   Pulmonary/Chest: Effort normal and breath sounds normal.  Abdominal: Soft. Bowel sounds are normal. There is tenderness.  Musculoskeletal: Normal range of motion. She exhibits no edema.  Neurological: She is  alert and oriented to person, place, and time.    Mallampati Score:     Imaging: No results found.  Labs:  CBC:  Recent Labs  11/26/14 1014 05/27/15 0906 09/02/15 0812 09/15/15 0730  WBC 11.0* 12.4* 13.5* 12.8*  HGB 14.5 14.5 15.4 14.9  HCT 44.3 43.9 46.3 44.3  PLT 267 285 255 293    COAGS:  Recent Labs  09/15/15 0730  INR 0.93  APTT 30    BMP:  Recent Labs  05/27/15 0906  NA 141  K 4.2  CO2 27  GLUCOSE 116  BUN  17.4  CALCIUM 9.5  CREATININE 1.2*    LIVER FUNCTION TESTS:  Recent Labs  05/27/15 0906  BILITOT 0.37  AST 26  ALT 24  ALKPHOS 86  PROT 6.9  ALBUMIN 4.1    TUMOR MARKERS: No results for input(s): AFPTM, CEA, CA199, CHROMGRNA in the last 8760 hours.  Assessment and Plan: [name] is a 59 y.o. female smoker  with history of persistent leukocytosis of uncertain etiology who presents today for CT guided bone marrow biopsy for further evaluation.Risks and benefits discussed with the patient including, but not limited to bleeding, infection, damage to adjacent structures or low yield requiring additional tests. All of the patient's questions were answered, patient is agreeable to proceed. Consent signed and in chart.     Thank you for this interesting consult.  I greatly enjoyed meeting [name] and look forward to participating in their care.  A copy of this report was sent to the requesting provider on this date.  Signed: D. Rowe Robert 09/15/2015, 8:32 AM   I spent a total of 15 minutes in face to face in clinical consultation, greater than 50% of which was counseling/coordinating care for CT guided bone marrow biopsy

## 2015-09-15 NOTE — Discharge Instructions (Signed)
Rest for remainder of day. No driving x 24 hours. Leave dressing on for 24 hours then remove and may bathe as usual. Tylenol if needed for pain, if no relief call  [facility] at [phone]. Call for any bleeding, questions or problems.     Conscious Sedation Sedation is the use of medicines to promote relaxation and relieve discomfort and anxiety. Conscious sedation is a type of sedation. Under conscious sedation you are less alert than normal but are still able to respond to instructions or stimulation. Conscious sedation is used during short me dical and dental procedures. It is milder than deep sedation or general anesthesia and allows you to return to your regular activities sooner.  LET [facility] CARE PROVIDER KNOW ABOUT:   Any allergies you have.  All medicines you are taking, including vitamins, herbs, eye drops, creams, and over-the-counter medicines.  Use of steroids (by mouth or creams).  Previous problems you or members of your family have had with the use of anesthetics.  Any blood disorders you have.  Previous surgeries you have had.  Medical conditions you have.  Possibility of pregnancy, if this applies.  Use of cigarettes, alcohol, or illegal drugs. RISKS AND COMPLICATIONS Generally, this is a safe procedure. However, as with any procedure, problems can occur. Possible problems include:  Oversedation.  Trouble breathing on your own. You may need to have a breathing tube until you are awake and breathing on your own.  Allergic reaction to any of the medicines used for the procedure. BEFORE THE PROCEDURE  You may have blood tests done. These tests can help show how well your kidneys and liver are working. They can also show how well your blood clots.  A physical exam will be done.  Only take medicines as directed by your health care provider. You may need to stop taking medicines (such as blood thinners, aspirin, or nonsteroidal  anti-inflammatory drugs) before the procedure.   Do not eat or drink at least 6 hours before the procedure or as directed by your health care provider.  Arrange for a responsible adult, family member, or friend to take you home after the procedure. He or she should stay with you for at least 24 hours after the procedure, until the medicine has worn off. PROCEDURE   An intravenous (IV) catheter will be inserted into one of your veins. Medicine will be able to flow directly into your body through this catheter. You may be given medicine through this tube to help prevent pain and help you relax.  The medical or dental procedure will be done. AFTER THE PROCEDURE  You will stay in a recovery area until the medicine has worn off. Your blood pressure and pulse will be checked.   Depending on the procedure you had, you may be allowed to go home when you can tolerate liquids and your pain is under control. Document Released: 08/29/2001 Document Revised: 12/09/2013 Document Reviewed: 08/11/2013 [facility] Patient Information 2015 [place], [place]. This information is not intended to replace advice given to you by your health care provider. Make sure you discuss any questions you have with your health care provider. Bone Marrow Aspiration, Bone Marrow Biopsy Care After Read the instructions outlined below and refer to this sheet in the next few weeks. These discharge instructions provide you with general information on caring for yourself after you leave the hospital. Your caregiver may also give you specific instructions. While your treatment has been planned according to the most current  medical practices available, unavoidable complications occasionally occur. If you have any problems or questions after discharge, call your caregiver. FINDING OUT THE RESULTS OF YOUR TEST Not all test results are available during your visit. If your test results are not back during the visit, make an appointment with  your caregiver to find out the results. Do not assume everything is normal if you have not heard from your caregiver or the medical facility. It is important for you to follow up on all of your test results.  HOME CARE INSTRUCTIONS  You have had sedation and may be sleepy or dizzy. Your thinking may not be as clear as usual. For the next 24 hours:  Only take over-the-counter or prescription medicines for pain, discomfort, and or fever as directed by your caregiver.  Do not drink alcohol.  Do not smoke.  Do not drive.  Do not make important legal decisions.  Do not operate heavy machinery.  Do not care for small children by yourself.  Keep your dressing clean and dry. You may replace dressing with a bandage after 24 hours.  You may take a bath or shower after 24 hours.  Use an ice pack for 20 minutes every 2 hours while awake for pain as needed. SEEK MEDICAL CARE IF:   There is redness, swelling, or increasing pain at the biopsy site.  There is pus coming from the biopsy site.  There is drainage from a biopsy site lasting longer than one day.  An unexplained oral temperature above 102 F (38.9 C) develops. SEEK IMMEDIATE MEDICAL CARE IF:   You develop a rash.  You have difficulty breathing.  You develop any reaction or side effects to medications given. Document Released: 06/23/2005 Document Revised: 02/26/2012 Document Reviewed: 12/01/2008 [facility] Patient Information 2015 [place], [place]. This information is not intended to replace advice given to you by your health care provider. Make sure you discuss any questions you have with your health care provider. Bone Marrow Aspiration and Bone Biopsy Examination of the bone marrow is a valuable test to diagnose blood disorders. A bone marrow biopsy takes a sample of bone and a small amount of fluid and cells from inside the bone. A bone marrow aspiration removes only the marrow. Bone marrow aspiration and bone biopsies are used  to stage different disorders of the blood, such as leukemia. Staging will help your caregiver understand how far the disease has progressed.  The tests are also useful in diagnosing:  Fever of unknown origin (FUO).  Bacterial infections and other widespread fungal infections.  Cancers that have spread (metastasized) to the bone marrow.  Diseases that are characterized by a deficiency of an enzyme (storage diseases). This includes:  Niemann-Pick disease.  Gaucher disease. PROCEDURE  Sites used to get samples include:   Back of your hip bone (posterior iliac crest).  Both aspiration and biopsy.  Front of your hip bone (anterior iliac crest).  Both aspiration and biopsy.  Breastbone (sternum).  Aspiration from your breastbone (done only in adults). This method is rarely used. When you get a hip bone aspiration:  You are placed lying on your side with the upper knee brought up and flexed with the lower leg straight.  The site is prepared, cleaned with an antiseptic scrub, and draped. This keeps the biopsy area clean.  The skin and the area down to the lining of the bone (periosteum) are made numb with a local anesthetic.  The bone marrow aspiration needle is inserted. You will  feel pressure on your bone.  Once inside the marrow cavity, a sample of bone marrow is sucked out (aspirated) for pathology slides.  The material collected for bone marrow slides is processed immediately by a technologist.  The technician selects the marrow particles to make the slides for pathology.  The marrow aspiration needle is removed. Then pressure is applied to the site with gauze until bleeding has stopped. Following an aspiration, a bone marrow biopsy may be performed as well. The technique for this is very similar. A dressing is then applied.  RISKS AND COMPLICATIONS  The main complications of a bone marrow aspiration and biopsy include infection and bleeding.  Complications are  uncommon. The procedure may not be performed in patients with bleeding tendencies.  A very rare complication from the procedure is injury to the heart during a breastbone (sternal) marrow aspiration. Only bone marrow aspirations are performed in this area.  Long-lasting pain at the site of the bone marrow aspiration and biopsy is uncommon. Your caregiver will let you know when you are to get your results and will discuss them with you. You may make an appointment with your caregiver to find out the results. Do not assume everything is normal if you have not heard from your caregiver or the medical facility. It is important for you to follow up on all of your test results. Document Released: 12/07/2004 Document Revised: 02/26/2012 Document Reviewed: 12/01/2008 [facility] Patient Information 2015 [place], [place]. This information is not intended to replace advice given to you by your health care provider. Make sure you discuss any questions you have with your health care provider.

## 2015-09-15 NOTE — Procedures (Signed)
Successful left iliac BM APS AND CORE BX No comp Stable Path pending Full report in PACS

## 2015-09-16 ENCOUNTER — Other Ambulatory Visit: Payer: Self-pay | Admitting: Cardiovascular Disease

## 2015-09-16 DIAGNOSIS — R296 Repeated falls: Secondary | ICD-10-CM

## 2015-09-16 DIAGNOSIS — F17210 Nicotine dependence, cigarettes, uncomplicated: Secondary | ICD-10-CM | POA: Diagnosis not present

## 2015-09-16 DIAGNOSIS — J449 Chronic obstructive pulmonary disease, unspecified: Secondary | ICD-10-CM | POA: Diagnosis not present

## 2015-09-16 DIAGNOSIS — E663 Overweight: Secondary | ICD-10-CM | POA: Diagnosis not present

## 2015-09-16 DIAGNOSIS — R42 Dizziness and giddiness: Secondary | ICD-10-CM | POA: Diagnosis not present

## 2015-09-20 ENCOUNTER — Ambulatory Visit
Admission: RE | Admit: 2015-09-20 | Discharge: 2015-09-20 | Disposition: A | Discharge status: Home / Self Care | Payer: Medicare Other | Source: Ambulatory Visit | Attending: Cardiovascular Disease | Admitting: Cardiovascular Disease

## 2015-09-20 DIAGNOSIS — R296 Repeated falls: Secondary | ICD-10-CM

## 2015-09-20 DIAGNOSIS — R51 Headache: Secondary | ICD-10-CM | POA: Diagnosis not present

## 2015-09-23 LAB — CHROMOSOME ANALYSIS, BONE MARROW

## 2015-09-29 ENCOUNTER — Emergency Department (HOSPITAL_COMMUNITY): Payer: Medicare Other

## 2015-09-29 ENCOUNTER — Emergency Department (HOSPITAL_COMMUNITY)
Admission: EM | Admit: 2015-09-29 | Discharge: 2015-09-29 | Disposition: A | Discharge status: Home / Self Care | Payer: Medicare Other | Attending: Emergency Medicine | Admitting: Emergency Medicine

## 2015-09-29 ENCOUNTER — Encounter (HOSPITAL_COMMUNITY): Payer: Self-pay | Admitting: Family Medicine

## 2015-09-29 DIAGNOSIS — Z720 Tobacco use: Secondary | ICD-10-CM | POA: Insufficient documentation

## 2015-09-29 DIAGNOSIS — Z79899 Other long term (current) drug therapy: Secondary | ICD-10-CM | POA: Diagnosis not present

## 2015-09-29 DIAGNOSIS — J45909 Unspecified asthma, uncomplicated: Secondary | ICD-10-CM | POA: Insufficient documentation

## 2015-09-29 DIAGNOSIS — I10 Essential (primary) hypertension: Secondary | ICD-10-CM | POA: Insufficient documentation

## 2015-09-29 DIAGNOSIS — Z880 Allergy status to penicillin: Secondary | ICD-10-CM | POA: Diagnosis not present

## 2015-09-29 DIAGNOSIS — R531 Weakness: Secondary | ICD-10-CM | POA: Diagnosis not present

## 2015-09-29 DIAGNOSIS — G5622 Lesion of ulnar nerve, left upper limb: Secondary | ICD-10-CM | POA: Insufficient documentation

## 2015-09-29 DIAGNOSIS — Z8719 Personal history of other diseases of the digestive system: Secondary | ICD-10-CM | POA: Diagnosis not present

## 2015-09-29 DIAGNOSIS — I2510 Atherosclerotic heart disease of native coronary artery without angina pectoris: Secondary | ICD-10-CM | POA: Insufficient documentation

## 2015-09-29 DIAGNOSIS — G4733 Obstructive sleep apnea (adult) (pediatric): Secondary | ICD-10-CM | POA: Insufficient documentation

## 2015-09-29 DIAGNOSIS — D509 Iron deficiency anemia, unspecified: Secondary | ICD-10-CM | POA: Insufficient documentation

## 2015-09-29 DIAGNOSIS — G4700 Insomnia, unspecified: Secondary | ICD-10-CM | POA: Diagnosis not present

## 2015-09-29 DIAGNOSIS — Z7982 Long term (current) use of aspirin: Secondary | ICD-10-CM | POA: Insufficient documentation

## 2015-09-29 DIAGNOSIS — M6281 Muscle weakness (generalized): Secondary | ICD-10-CM | POA: Diagnosis not present

## 2015-09-29 DIAGNOSIS — Z9981 Dependence on supplemental oxygen: Secondary | ICD-10-CM | POA: Diagnosis not present

## 2015-09-29 DIAGNOSIS — M797 Fibromyalgia: Secondary | ICD-10-CM | POA: Insufficient documentation

## 2015-09-29 DIAGNOSIS — F329 Major depressive disorder, single episode, unspecified: Secondary | ICD-10-CM | POA: Diagnosis not present

## 2015-09-29 DIAGNOSIS — F419 Anxiety disorder, unspecified: Secondary | ICD-10-CM | POA: Insufficient documentation

## 2015-09-29 LAB — I-STAT CHEM 8, ED
BUN: 14 mg/dL (ref 6–20)
CHLORIDE: 100 mmol/L — AB (ref 101–111)
CREATININE: 1 mg/dL (ref 0.44–1.00)
Calcium, Ion: 1.2 mmol/L (ref 1.13–1.30)
GLUCOSE: 121 mg/dL — AB (ref 65–99)
HCT: 49 % — ABNORMAL HIGH (ref 36.0–46.0)
HEMOGLOBIN: 16.7 g/dL — AB (ref 12.0–15.0)
POTASSIUM: 3.3 mmol/L — AB (ref 3.5–5.1)
Sodium: 141 mmol/L (ref 135–145)
TCO2: 26 mmol/L (ref 0–100)

## 2015-09-29 LAB — COMPREHENSIVE METABOLIC PANEL
ALT: 40 U/L (ref 14–54)
AST: 46 U/L — AB (ref 15–41)
Albumin: 4.2 g/dL (ref 3.5–5.0)
Alkaline Phosphatase: 80 U/L (ref 38–126)
Anion gap: 10 (ref 5–15)
BUN: 11 mg/dL (ref 6–20)
CHLORIDE: 99 mmol/L — AB (ref 101–111)
CO2: 27 mmol/L (ref 22–32)
CREATININE: 1.03 mg/dL — AB (ref 0.44–1.00)
Calcium: 9.6 mg/dL (ref 8.9–10.3)
GFR calc Af Amer: >60 mL/min (ref >60)
GFR, EST NON AFRICAN AMERICAN: 57 mL/min — AB (ref >60)
GLUCOSE: 123 mg/dL — AB (ref 65–99)
Potassium: 3.2 mmol/L — ABNORMAL LOW (ref 3.5–5.1)
Sodium: 136 mmol/L (ref 135–145)
Total Bilirubin: 0.5 mg/dL (ref 0.3–1.2)
Total Protein: 7 g/dL (ref 6.5–8.1)

## 2015-09-29 LAB — CBC
HEMATOCRIT: 45.1 % (ref 36.0–46.0)
HEMOGLOBIN: 14.4 g/dL (ref 12.0–15.0)
MCH: 28.6 pg (ref 26.0–34.0)
MCHC: 31.9 g/dL (ref 30.0–36.0)
MCV: 89.7 fL (ref 78.0–100.0)
Platelets: 276 10*3/uL (ref 150–400)
RBC: 5.03 MIL/uL (ref 3.87–5.11)
RDW: 13 % (ref 11.5–15.5)
WBC: 13 10*3/uL — AB (ref 4.0–10.5)

## 2015-09-29 LAB — PROTIME-INR
INR: 1.05 (ref 0.00–1.49)
Prothrombin Time: 13.9 seconds (ref 11.6–15.2)

## 2015-09-29 LAB — I-STAT TROPONIN, ED: TROPONIN I, POC: 0.02 ng/mL (ref 0.00–0.08)

## 2015-09-29 LAB — DIFFERENTIAL
BASOS PCT: 1 %
Basophils Absolute: 0.1 10*3/uL (ref 0.0–0.1)
Eosinophils Absolute: 0.3 10*3/uL (ref 0.0–0.7)
Eosinophils Relative: 2 %
LYMPHS ABS: 2.2 10*3/uL (ref 0.7–4.0)
Lymphocytes Relative: 17 %
MONOS PCT: 5 %
Monocytes Absolute: 0.7 10*3/uL (ref 0.1–1.0)
NEUTROS ABS: 9.8 10*3/uL — AB (ref 1.7–7.7)
Neutrophils Relative %: 75 %

## 2015-09-29 LAB — APTT: APTT: 28 s (ref 24–37)

## 2015-09-29 NOTE — ED Provider Notes (Signed)
CSN: [id number]     Arrival date & time 09/29/15  1148 History   First MD Initiated Contact with Patient 09/29/15 1236     Chief Complaint  Patient presents with  . Extremity Weakness     (Consider location/radiation/quality/duration/timing/severity/associated sxs/prior Treatment) Patient is a 59 y.o. female presenting with extremity weakness. The history is provided by the patient.  Extremity Weakness This is a recurrent problem. The current episode started yesterday. The problem occurs constantly. The problem has not changed since onset.Pertinent negatives include no chest pain, no headaches and no shortness of breath. Nothing aggravates the symptoms. Nothing relieves the symptoms. She has tried nothing for the symptoms. The treatment provided no relief.    59 yo F with cc of difficulty with grip strength with her left hand.  Left hand dominant.  Difficulty holding objects.  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  Headache, but unchanged from chronic.  Denies numbness, tingling. Denies speech, vision, personality changes.   Past Medical History  Diagnosis Date  . Hypertension   . Coronary artery disease CARDIOLOGIST- DR  Doylene Canard  (VISIT 03-30-11 W/ CHART)    NON-OBS. CAD   (STRESS TEST NOV. 2011  . Asthma   . Non-productive cough   . Numbness in both hands AT TIMES  . DJD (degenerative joint disease)     JOINT PAIN  . DDD (degenerative disc disease)   . Fibromyalgia   . Depression   . Anxiety   . GERD (gastroesophageal reflux disease) AND HIATIAL HERNIA    CONTROLLED W/ NEXIUM  . History of gastric ulcer 2004  . IBS (irritable bowel syndrome)   . Hemorrhoids   . Urge urinary incontinence   . Frequency of urination   . Nocturia   . Iron deficiency anemia   . Agoraphobia without history of panic disorder   . Insomnia   . Incisional pain s/p interstim implant 1st stage--- 12-07-11     left upper buttock-- pt states dressing clean dry and intact (on 12-08-11)  . OSA on CPAP     Past Surgical History  Procedure Laterality Date  . Cardiac catheterization  2003  . Cysto/ hod/ bladder bx/ fulgeration  06-12-2011  . Cervical fusion  2008    C4 - T1  . Laminectomy  2007    L3 - 5  . Cysto/ hod/ bladder bx  2006  . Hysteroscopy w/d&c  2005  . Cervical discectomy  2002    C4 - 7  . Right thumb surg.  2001  . Cholecystectomy  1982  . Tonsillectomy and adenoidectomy  1965  . Interstim implant placement  12/07/2011    Procedure: INTERSTIM IMPLANT FIRST STAGE;  Surgeon: [name], MD;  Location: [facility];  Service: Urology;  Laterality: Right;  . Interstim implant placement  12/14/2011    Procedure: INTERSTIM IMPLANT SECOND STAGE;  Surgeon: [name], MD;  Location: [facility];  Service: Urology;  Laterality: Right;  rad tech ok by v[facility] at [facility]  . Wrist surgery Left     TFCC   History reviewed. No pertinent family history. Social History  Substance Use Topics  . Smoking status: Current Every Day Smoker -- 2.00 packs/day for 30 years  . Smokeless tobacco: Never Used  . Alcohol Use: Yes     Comment: OCCASIONAL   OB History    No data available     Review of Systems  Constitutional: Negative for fever and chills.  HENT:  Negative for congestion and rhinorrhea.   Eyes: Negative for redness and visual disturbance.  Respiratory: Negative for shortness of breath and wheezing.   Cardiovascular: Negative for chest pain and palpitations.  Gastrointestinal: Negative for nausea and vomiting.  Genitourinary: Negative for dysuria and urgency.  Musculoskeletal: Positive for myalgias, arthralgias and extremity weakness.  Skin: Negative for pallor and wound.  Neurological: Negative for dizziness and headaches.      Allergies  Statins; Ace inhibitors; Amoxicillin; Midazolam hcl; Nsaids; Prednisone; and Sulfa antibiotics  Home Medications   Prior to Admission medications   Medication Sig Start Date End Date  Taking? Authorizing Provider  albuterol (PROVENTIL HFA;VENTOLIN HFA) 108 (90 BASE) MCG/ACT inhaler Inhale 2 puffs into the lungs every 6 (six) hours as needed for wheezing or shortness of breath.   Yes Historical Provider, MD  amLODipine (NORVASC) 5 MG tablet Take 5 mg by mouth every morning.     Yes Historical Provider, MD  aspirin EC 325 MG tablet Take 325 mg by mouth every morning.    Yes Historical Provider, MD  buPROPion (WELLBUTRIN XL) 150 MG 24 hr tablet Take 150 mg by mouth daily.    Yes Historical Provider, MD  cholecalciferol (VITAMIN D) 1000 UNITS tablet Take 1,000 Units by mouth every morning.    Yes Historical Provider, MD  clonazePAM (KLONOPIN) 1 MG tablet Take 1 mg by mouth 2 (two) times daily. 05/10/15  Yes Historical Provider, MD  DULoxetine (CYMBALTA) 60 MG capsule Take 60 mg by mouth at bedtime.    Yes Historical Provider, MD  fenofibrate (TRICOR) 48 MG tablet Take 48 mg by mouth every morning.  04/30/15  Yes Historical Provider, MD  ferrous sulfate 325 (65 FE) MG tablet Take 325 mg by mouth daily with breakfast.   Yes Historical Provider, MD  HYDROcodone-acetaminophen (NORCO) 5-325 MG per tablet Take 1 tablet by mouth 2 (two) times daily as needed for pain.    Yes Historical Provider, MD  ibuprofen (ADVIL,MOTRIN) 200 MG tablet Take 600 mg by mouth every 6 (six) hours as needed for pain.   Yes Historical Provider, MD  metoprolol tartrate (LOPRESSOR) 25 MG tablet Take 12.5 mg by mouth 2 (two) times daily.    Yes Historical Provider, MD  [place] Stem therapy for overactive bladder implant on the right side.   Yes Historical Provider, MD  Probiotic Product (PROBIOTIC PO) Take 1 tablet by mouth daily.   Yes Historical Provider, MD  traZODone (DESYREL) 50 MG tablet Take 50 mg by mouth at bedtime.     Yes Historical Provider, MD  vitamin B-12 (CYANOCOBALAMIN) 100 MCG tablet Take 100 mcg by mouth daily. Super B12 complex   Yes Historical Provider, MD  vitamin C  (ASCORBIC ACID) 500 MG tablet Take 500 mg by mouth every morning.    Yes Historical Provider, MD  methocarbamol (ROBAXIN) 500 MG tablet Take 500 mg by mouth 3 (three) times daily as needed (for muscle spasms).    Historical Provider, MD   BP 137/58 mmHg  Pulse 68  Temp(Src) 98.6 F (37 C)  Resp 24  SpO2 93%  LMP 09/06/2005 Physical Exam  Constitutional: She is oriented to person, place, and time. She appears well-developed and well-nourished. No distress.  HENT:  Head: Normocephalic and atraumatic.  Eyes: EOM are normal. Pupils are equal, round, and reactive to light.  Neck: Normal range of motion. Neck supple.  Cardiovascular: Normal rate and regular rhythm.  Exam reveals no gallop and no friction rub.  No murmur heard. Pulmonary/Chest: Effort normal. She has no wheezes. She has no rales.  Abdominal: Soft. She exhibits no distension. There is no tenderness. There is no rebound and no guarding.  Musculoskeletal: She exhibits no edema or tenderness.  Neurological: She is alert and oriented to person, place, and time.  4/5 left handed grip strength compared to 5/5 right, ulnar clawing. PMS intact with mild decreased ulnar strength  Skin: Skin is warm and dry. She is not diaphoretic.  Psychiatric: She has a normal mood and affect. Her behavior is normal.    ED Course  Procedures (including critical care time) Labs Review Labs Reviewed  CBC - Abnormal; Notable for the following:    WBC 13.0 (*)    All other components within normal limits  DIFFERENTIAL - Abnormal; Notable for the following:    Neutro Abs 9.8 (*)    All other components within normal limits  COMPREHENSIVE METABOLIC PANEL - Abnormal; Notable for the following:    Potassium 3.2 (*)    Chloride 99 (*)    Glucose, Bld 123 (*)    Creatinine, Ser 1.03 (*)    AST 46 (*)    GFR calc non Af Amer 57 (*)    All other components within normal limits  I-STAT CHEM 8, ED - Abnormal; Notable for the following:    Potassium  3.3 (*)    Chloride 100 (*)    Glucose, Bld 121 (*)    Hemoglobin 16.7 (*)    HCT 49.0 (*)    All other components within normal limits  PROTIME-INR  APTT  I-STAT TROPOININ, ED  CBG MONITORING, ED    Imaging Review Ct Head Wo Contrast  09/29/2015  CLINICAL DATA:  Unable to grip things with left hand this morning. Weakness in left arm. EXAM: CT HEAD WITHOUT CONTRAST TECHNIQUE: Contiguous axial images were obtained from the base of the skull through the vertex without intravenous contrast. COMPARISON:  09/20/2015 FINDINGS: Patchy low-density throughout the deep white matter, nonspecific most likely chronic microvascular disease. No acute intracranial abnormality. Specifically, no hemorrhage, hydrocephalus, mass lesion, acute infarction, or significant intracranial injury. No acute calvarial abnormality. Visualized paranasal sinuses and mastoids clear. Orbital soft tissues unremarkable. IMPRESSION: No acute intracranial abnormality. Electronically Signed   By: Rolm Baptise M.D.   On: 09/29/2015 14:33   I have personally reviewed and evaluated these images and lab results as part of my medical decision-making.   EKG Interpretation   Date/Time:  Wednesday September 29 2015 11:58:45 EDT Ventricular Rate:  69 PR Interval:  192 QRS Duration: 98 QT Interval:  442 QTC Calculation: 473 R Axis:   87 Text Interpretation:  Normal sinus rhythm Anterior infarct , age  undetermined Abnormal ECG No significant change since last tracing  Confirmed by Karrina Lye MD, Quillian Quince [phone]) on 09/29/2015 1:16:06 PM      MDM   Final diagnoses:  Ulnar neuropathy of left upper extremity    59 yo F with a chief complaint of left hand weakness. The started this morning when she woke up. Patient has difficulty grasping her cigarette. Patient has had difficulties with her left shoulder in the past but no pain currently. Exam consistent with ulnar neuropathy. Case was discussed with neurology. Dr. Janann Colonel evaluated the  patient at bedside. Agrees likely ulnar neuropathy. Left patient follow-up with neurology for outpatient EMG. Patient's risk factors it was recommended that she get an echo as well as carotid Dopplers.  4:01 PM:  I have discussed the  diagnosis/risks/treatment options with the patient and believe the pt to be eligible for discharge home to follow-up with Neuro. We also discussed returning to the ED immediately if new or worsening sx occur. We discussed the sx which are most concerning (e.g., sudden worsening weakness, other stroke like symptoms) that necessitate immediate return. Medications administered to the patient during their visit and any new prescriptions provided to the patient are listed below.  Medications given during this visit Medications - No data to display  Discharge Medication List as of 09/29/2015  3:31 PM      The patient appears reasonably screen and/or stabilized for discharge and I doubt any other medical condition or other [facility] requiring further screening, evaluation, or treatment in the ED at this time prior to discharge.      Deno Etienne, DO 09/29/15 1601

## 2015-09-29 NOTE — Discharge Instructions (Signed)
Do an echocardiogram and carotid doppler studies at your PCP please!   Peripheral Neuropathy Peripheral neuropathy is a type of nerve damage. It affects nerves that carry signals between the spinal cord and other parts of the body. These are called peripheral nerves. With peripheral neuropathy, one nerve or a group of nerves may be damaged.  CAUSES  Many things can damage peripheral nerves. For some people with peripheral neuropathy, the cause is unknown. Some causes include:  Diabetes. This is the most common cause of peripheral neuropathy.  Injury to a nerve.  Pressure or stress on a nerve that lasts a long time.  Too little vitamin B. Alcoholism can lead to this.  Infections.  Autoimmune diseases, such as multiple sclerosis and systemic lupus erythematosus.  Inherited nerve diseases.  Some medicines, such as cancer drugs.  Toxic substances, such as lead and mercury.  Too little blood flowing to the legs.  Kidney disease.  Thyroid disease. SIGNS AND SYMPTOMS  Different people have different symptoms. The symptoms you have will depend on which of your nerves is damaged. Common symptoms include:  Loss of feeling (numbness) in the feet and hands.  Tingling in the feet and hands.  Pain that burns.  Very sensitive skin.  Weakness.  Not being able to move a part of the body (paralysis).  Muscle twitching.  Clumsiness or poor coordination.  Loss of balance.  Not being able to control your bladder.  Feeling dizzy.  Sexual problems. DIAGNOSIS  Peripheral neuropathy is a symptom, not a disease. Finding the cause of peripheral neuropathy can be hard. To figure that out, your health care provider will take a medical history and do a physical exam. A neurological exam will also be done. This involves checking things affected by your brain, spinal cord, and nerves (nervous system). For example, your health care provider will check your reflexes, how you move, and what  you can feel.  Other types of tests may also be ordered, such as:  Blood tests.  A test of the fluid in your spinal cord.  Imaging tests, such as CT scans or an MRI.  Electromyography (EMG). This test checks the nerves that control muscles.  Nerve conduction velocity tests. These tests check how fast messages pass through your nerves.  Nerve biopsy. A small piece of nerve is removed. It is then checked under a microscope. TREATMENT   Medicine is often used to treat peripheral neuropathy. Medicines may include:  Pain-relieving medicines. Prescription or over-the-counter medicine may be suggested.  Antiseizure medicine. This may be used for pain.  Antidepressants. These also may help ease pain from neuropathy.  Lidocaine. This is a numbing medicine. You might wear a patch or be given a shot.  Mexiletine. This medicine is typically used to help control irregular heart rhythms.  Surgery. Surgery may be needed to relieve pressure on a nerve or to destroy a nerve that is causing pain.  Physical therapy to help movement.  Assistive devices to help movement. HOME CARE INSTRUCTIONS   Only take over-the-counter or prescription medicines as directed by your health care provider. Follow the instructions carefully for any given medicines. Do not take any other medicines without first getting approval from your health care provider.  If you have diabetes, work closely with your health care provider to keep your blood sugar under control.  If you have numbness in your feet:  Check every day for signs of injury or infection. Watch for redness, warmth, and swelling.  Wear padded  socks and comfortable shoes. These help protect your feet.  Do not do things that put pressure on your damaged nerve.  Do not smoke. Smoking keeps blood from getting to damaged nerves.  Avoid or limit alcohol. Too much alcohol can cause a lack of B vitamins. These vitamins are needed for healthy  nerves.  Develop a good support system. Coping with peripheral neuropathy can be stressful. Talk to a mental health specialist or join a support group if you are struggling.  Follow up with your health care provider as directed. SEEK MEDICAL CARE IF:   You have new signs or symptoms of peripheral neuropathy.  You are struggling emotionally from dealing with peripheral neuropathy.  You have a fever. SEEK IMMEDIATE MEDICAL CARE IF:   You have an injury or infection that is not healing.  You feel very dizzy or begin vomiting.  You have chest pain.  You have trouble breathing.   This information is not intended to replace advice given to you by your health care provider. Make sure you discuss any questions you have with your health care provider.   Document Released: 11/24/2002 Document Revised: 08/16/2011 Document Reviewed: 08/11/2013 Elsevier Interactive Patient Education [employer].

## 2015-09-29 NOTE — ED Notes (Signed)
Pt sts that she woke up this am and was unable to grip things with her left hand. sts weakness in left arm. Denies weakness anywhere else. Denies numbness or pain.

## 2015-09-29 NOTE — Consult Note (Signed)
Consult Reason for Consult: left hand weakness Referring Physician: Dr Tyrone Nine  CC: left hand weakness  HPI: [name] is an 59 y.o. female hx of depression/anxiety, cervical degenerative disc disease presenting after waking up with weakness in her left hand. Notes difficulty gripping objects with her hand. Notes it is a more distal weakness. Denies any sensory deficits. No RUE or LE weakness or sensory deficits. No speech deficits. Denies sleep in a strange position. Notes taking ASA 325mg  daily. CT head imaging reviewed and overall unremarkable.   Past Medical History  Diagnosis Date  . Hypertension   . Coronary artery disease CARDIOLOGIST- DR  Doylene Canard  (VISIT 03-30-11 W/ CHART)    NON-OBS. CAD   (STRESS TEST NOV. 2011  . Asthma   . Non-productive cough   . Numbness in both hands AT TIMES  . DJD (degenerative joint disease)     JOINT PAIN  . DDD (degenerative disc disease)   . Fibromyalgia   . Depression   . Anxiety   . GERD (gastroesophageal reflux disease) AND HIATIAL HERNIA    CONTROLLED W/ NEXIUM  . History of gastric ulcer 2004  . IBS (irritable bowel syndrome)   . Hemorrhoids   . Urge urinary incontinence   . Frequency of urination   . Nocturia   . Iron deficiency anemia   . Agoraphobia without history of panic disorder   . Insomnia   . Incisional pain s/p interstim implant 1st stage--- 12-07-11     left upper buttock-- pt states dressing clean dry and intact (on 12-08-11)  . OSA on CPAP     Past Surgical History  Procedure Laterality Date  . Cardiac catheterization  2003  . Cysto/ hod/ bladder bx/ fulgeration  06-12-2011  . Cervical fusion  2008    C4 - T1  . Laminectomy  2007    L3 - 5  . Cysto/ hod/ bladder bx  2006  . Hysteroscopy w/d&c  2005  . Cervical discectomy  2002    C4 - 7  . Right thumb surg.  2001  . Cholecystectomy  1982  . Tonsillectomy and adenoidectomy  1965  . Interstim implant placement  12/07/2011    Procedure: INTERSTIM IMPLANT  FIRST STAGE;  Surgeon: [name], MD;  Location: [facility];  Service: Urology;  Laterality: Right;  . Interstim implant placement  12/14/2011    Procedure: INTERSTIM IMPLANT SECOND STAGE;  Surgeon: [name], MD;  Location: [facility];  Service: Urology;  Laterality: Right;  rad tech ok by vickie at [facility]  . Wrist surgery Left     [facility]    History reviewed. No pertinent family history.  Social History:  reports that she has been smoking.  She has never used smokeless tobacco. She reports that she drinks alcohol. She reports that she does not use illicit drugs.  Allergies  Allergen Reactions  . Statins Other (See Comments)    Causes muscle weakness and joint pain  . Ace Inhibitors Other (See Comments)    DECREASES HEARTRATE  . Amoxicillin Diarrhea  . Midazolam Hcl     HYPER  . Nsaids     AVOIDS HX GASTRIC ULCER  . Prednisone Other (See Comments)    HYPER  . Sulfa Antibiotics Rash and Other (See Comments)    JOINT PAIN    Medications: Scheduled:  ROS: Out of a complete 14 system review, the patient complains of only the following symptoms, and all other reviewed  systems are negative. +weakness  Physical Examination: Filed Vitals:   09/29/15 1454  BP: 150/63  Pulse: 59  Temp:   Resp: 24   Physical Exam  Constitutional: He appears well-developed and well-nourished.  Psych: Affect appropriate to situation Eyes: No scleral injection HENT: No OP obstrucion Head: Normocephalic.  Cardiovascular: Normal rate and regular rhythm.  Respiratory: Effort normal and breath sounds normal.  GI: Soft. Bowel sounds are normal. No distension. There is no tenderness.  Skin: WDI  Neurologic Examination Mental Status: Alert, oriented, thought content appropriate.  Speech fluent without evidence of aphasia.  Able to follow 3 step commands without difficulty. Cranial Nerves: II: funduscopic exam wnl bilaterally, visual fields grossly  normal, pupils equal, round, reactive to light and accommodation III,IV, VI: ptosis not present, extra-ocular motions intact bilaterally V,VII: smile symmetric, facial light touch sensation normal bilaterally VIII: hearing normal bilaterally IX,X: gag reflex present XI: trapezius strength/neck flexion strength normal bilaterally XII: tongue strength normal  Motor: left hand noted flexed position of 4th/5th digits at rest 5/5 RUE, RLE, LLE 5/5 LUE with exception of adduction of 5th digit, flexion of distal 4th/5th digits, abduction of fingers Sensory: Pinprick and light touch intact throughout, bilaterally Deep Tendon Reflexes: 2+ and symmetric throughout Plantars: Right: downgoing   Left: downgoing Cerebellar: normal finger-to-nose, and normal heel-to-shin test Gait: deferred  Laboratory Studies:   Basic Metabolic Panel:  Recent Labs Lab 09/29/15 1203 09/29/15 1220  NA 136 141  K 3.2* 3.3*  CL 99* 100*  CO2 27  --   GLUCOSE 123* 121*  BUN 11 14  CREATININE 1.03* 1.00  CALCIUM 9.6  --     Liver Function Tests:  Recent Labs Lab 09/29/15 1203  AST 46*  ALT 40  ALKPHOS 80  BILITOT 0.5  PROT 7.0  ALBUMIN 4.2   No results for input(s): LIPASE, AMYLASE in the last 168 hours. No results for input(s): AMMONIA in the last 168 hours.  CBC:  Recent Labs Lab 09/29/15 1203 09/29/15 1220  WBC 13.0*  --   NEUTROABS 9.8*  --   HGB 14.4 16.7*  HCT 45.1 49.0*  MCV 89.7  --   PLT 276  --     Cardiac Enzymes: No results for input(s): CKTOTAL, CKMB, CKMBINDEX, TROPONINI in the last 168 hours.  BNP: Invalid input(s): POCBNP  CBG: No results for input(s): GLUCAP in the last 168 hours.  Microbiology: No results found for this or any previous visit.  Coagulation Studies:  Recent Labs  09/29/15 1203  LABPROT 13.9  INR 1.05    Urinalysis: No results for input(s): COLORURINE, LABSPEC, PHURINE, GLUCOSEU, HGBUR, BILIRUBINUR, KETONESUR, PROTEINUR, UROBILINOGEN,  NITRITE, LEUKOCYTESUR in the last 168 hours.  Invalid input(s): APPERANCEUR  Lipid Panel:  No results found for: CHOL, TRIG, HDL, CHOLHDL, VLDL, LDLCALC  HgbA1C: No results found for: HGBA1C  Urine Drug Screen:  No results found for: LABOPIA, COCAINSCRNUR, LABBENZ, AMPHETMU, THCU, LABBARB  Alcohol Level: No results for input(s): ETH in the last 168 hours.  Other results: EKG: normal sinus rhythm.  Imaging: Ct Head Wo Contrast  09/29/2015  CLINICAL DATA:  Unable to grip things with left hand this morning. Weakness in left arm. EXAM: CT HEAD WITHOUT CONTRAST TECHNIQUE: Contiguous axial images were obtained from the base of the skull through the vertex without intravenous contrast. COMPARISON:  09/20/2015 FINDINGS: Patchy low-density throughout the deep white matter, nonspecific most likely chronic microvascular disease. No acute intracranial abnormality. Specifically, no hemorrhage, hydrocephalus, mass lesion, acute infarction,  or significant intracranial injury. No acute calvarial abnormality. Visualized paranasal sinuses and mastoids clear. Orbital soft tissues unremarkable. IMPRESSION: No acute intracranial abnormality. Electronically Signed   By: Rolm Baptise M.D.   On: 09/29/2015 14:33     Assessment/Plan:  59y/o woman history of cervical degenerative disc disease presenting after waking with left hand weakness. Symptoms are in the ulnar distribution and most concerning for a lumbar radiculopathy. Cannot rule out a small CVA though this is less likely. Patient unable to get a MRI. Extensively discussed differential with patient, counseled her that we are unable to definitively rule out a stroke though this is less likely based on exam findings.   -outpatient neurology referral for EMG/NCS -can get 2D echo and carotid doppler with her PCP -continue ASA 325mg  daily -return to ED if symptoms worsen   Jim Like, DO [facility] [phone]  If 7pm- 7am, please page  neurology on call as listed in [place]. 09/29/2015, 3:32 PM

## 2015-09-30 DIAGNOSIS — E559 Vitamin D deficiency, unspecified: Secondary | ICD-10-CM | POA: Diagnosis not present

## 2015-09-30 DIAGNOSIS — E876 Hypokalemia: Secondary | ICD-10-CM | POA: Diagnosis not present

## 2015-09-30 DIAGNOSIS — G603 Idiopathic progressive neuropathy: Secondary | ICD-10-CM | POA: Diagnosis not present

## 2015-09-30 DIAGNOSIS — Z23 Encounter for immunization: Secondary | ICD-10-CM | POA: Diagnosis not present

## 2015-09-30 DIAGNOSIS — Z0000 Encounter for general adult medical examination without abnormal findings: Secondary | ICD-10-CM | POA: Diagnosis not present

## 2015-09-30 DIAGNOSIS — G629 Polyneuropathy, unspecified: Secondary | ICD-10-CM | POA: Diagnosis not present

## 2015-09-30 DIAGNOSIS — E782 Mixed hyperlipidemia: Secondary | ICD-10-CM | POA: Diagnosis not present

## 2015-09-30 DIAGNOSIS — R739 Hyperglycemia, unspecified: Secondary | ICD-10-CM | POA: Diagnosis not present

## 2015-10-01 ENCOUNTER — Encounter (HOSPITAL_COMMUNITY): Payer: Self-pay

## 2015-10-01 ENCOUNTER — Other Ambulatory Visit: Payer: Self-pay | Admitting: Family Medicine

## 2015-10-01 ENCOUNTER — Other Ambulatory Visit (HOSPITAL_COMMUNITY): Payer: Self-pay

## 2015-10-01 DIAGNOSIS — R29898 Other symptoms and signs involving the musculoskeletal system: Secondary | ICD-10-CM

## 2015-10-05 ENCOUNTER — Other Ambulatory Visit (HOSPITAL_COMMUNITY): Payer: Self-pay | Admitting: Family Medicine

## 2015-10-05 DIAGNOSIS — M6281 Muscle weakness (generalized): Secondary | ICD-10-CM

## 2015-10-08 ENCOUNTER — Ambulatory Visit
Admission: RE | Admit: 2015-10-08 | Discharge: 2015-10-08 | Disposition: A | Discharge status: Home / Self Care | Payer: Medicare Other | Source: Ambulatory Visit | Attending: Family Medicine | Admitting: Family Medicine

## 2015-10-08 DIAGNOSIS — G459 Transient cerebral ischemic attack, unspecified: Secondary | ICD-10-CM | POA: Diagnosis not present

## 2015-10-08 DIAGNOSIS — R29898 Other symptoms and signs involving the musculoskeletal system: Secondary | ICD-10-CM

## 2015-10-11 ENCOUNTER — Other Ambulatory Visit (HOSPITAL_BASED_OUTPATIENT_CLINIC_OR_DEPARTMENT_OTHER): Payer: Medicare Other

## 2015-10-11 ENCOUNTER — Encounter: Payer: Self-pay | Admitting: Internal Medicine

## 2015-10-11 ENCOUNTER — Ambulatory Visit (HOSPITAL_BASED_OUTPATIENT_CLINIC_OR_DEPARTMENT_OTHER): Payer: Medicare Other | Admitting: Internal Medicine

## 2015-10-11 VITALS — BP 144/65 | HR 64 | Temp 98.6°F | Resp 18 | Ht 60.0 in | Wt 145.8 lb

## 2015-10-11 DIAGNOSIS — D72829 Elevated white blood cell count, unspecified: Secondary | ICD-10-CM

## 2015-10-11 DIAGNOSIS — Z720 Tobacco use: Secondary | ICD-10-CM | POA: Diagnosis not present

## 2015-10-11 LAB — COMPREHENSIVE METABOLIC PANEL (CC13)
ALBUMIN: 4 g/dL (ref 3.5–5.0)
ALK PHOS: 79 U/L (ref 40–150)
ALT: 37 U/L (ref 0–55)
ANION GAP: 8 meq/L (ref 3–11)
AST: 34 U/L (ref 5–34)
BUN: 13.2 mg/dL (ref 7.0–26.0)
CALCIUM: 10 mg/dL (ref 8.4–10.4)
CO2: 28 mEq/L (ref 22–29)
CREATININE: 1 mg/dL (ref 0.6–1.1)
Chloride: 105 mEq/L (ref 98–109)
EGFR: 64 mL/min/{1.73_m2} — ABNORMAL LOW (ref >90)
Glucose: 118 mg/dl (ref 70–140)
Potassium: 3.4 mEq/L — ABNORMAL LOW (ref 3.5–5.1)
Sodium: 142 mEq/L (ref 136–145)
Total Bilirubin: 0.34 mg/dL (ref 0.20–1.20)
Total Protein: 6.9 g/dL (ref 6.4–8.3)

## 2015-10-11 LAB — CBC WITH DIFFERENTIAL/PLATELET
BASO%: 0.9 % (ref 0.0–2.0)
BASOS ABS: 0.1 10*3/uL (ref 0.0–0.1)
EOS%: 2.1 % (ref 0.0–7.0)
Eosinophils Absolute: 0.3 10*3/uL (ref 0.0–0.5)
HEMATOCRIT: 44.5 % (ref 34.8–46.6)
HEMOGLOBIN: 14.9 g/dL (ref 11.6–15.9)
LYMPH#: 1.6 10*3/uL (ref 0.9–3.3)
LYMPH%: 11.6 % — ABNORMAL LOW (ref 14.0–49.7)
MCH: 29.5 pg (ref 25.1–34.0)
MCHC: 33.5 g/dL (ref 31.5–36.0)
MCV: 88 fL (ref 79.5–101.0)
MONO#: 0.8 10*3/uL (ref 0.1–0.9)
MONO%: 5.6 % (ref 0.0–14.0)
NEUT#: 10.7 10*3/uL — ABNORMAL HIGH (ref 1.5–6.5)
NEUT%: 79.8 % — AB (ref 38.4–76.8)
PLATELETS: 279 10*3/uL (ref 145–400)
RBC: 5.06 10*6/uL (ref 3.70–5.45)
RDW: 13 % (ref 11.2–14.5)
WBC: 13.5 10*3/uL — ABNORMAL HIGH (ref 3.9–10.3)

## 2015-10-11 NOTE — Progress Notes (Signed)
[place] [phone]) [phone]   Fax:([phone]) [phone]  OFFICE PROGRESS NOTE  WEBB, Valla Leaver, MD [address] [place] [address]  DIAGNOSIS: Persistent leukocytosis questionable to be reactive in nature but myeloproliferative disorder cannot be ruled out at this point.  PRIOR THERAPY: None  CURRENT THERAPY: None  INTERVAL HISTORY: [name] 59 y.o. female returns to the clinic today for annual follow-up visit. The patient has no complaints today except for fatigue and low back pain. The patient also continues to smoke. She has history of depression and fibromyalgia. She denied having any significant weight loss or night sweats. The patient has no chest pain, shortness of breath, cough or hemoptysis. She has no nausea or vomiting, no fever or chills. She had a recent CT-guided bone marrow biopsy and aspirate for evaluation of the persistent leukocytosis and she is here today for evaluation and discussion of her biopsy results.  MEDICAL HISTORY: Past Medical History  Diagnosis Date  . Hypertension   . Coronary artery disease CARDIOLOGIST- DR  Doylene Canard  (VISIT 03-30-11 W/ CHART)    NON-OBS. CAD   (STRESS TEST NOV. 2011  . Asthma   . Non-productive cough   . Numbness in both hands AT TIMES  . DJD (degenerative joint disease)     JOINT PAIN  . DDD (degenerative disc disease)   . Fibromyalgia   . Depression   . Anxiety   . GERD (gastroesophageal reflux disease) AND HIATIAL HERNIA    CONTROLLED W/ NEXIUM  . History of gastric ulcer 2004  . IBS (irritable bowel syndrome)   . Hemorrhoids   . Urge urinary incontinence   . Frequency of urination   . Nocturia   . Iron deficiency anemia   . Agoraphobia without history of panic disorder   . Insomnia   . Incisional pain s/p interstim implant 1st stage--- 12-07-11     left upper buttock-- pt states dressing clean dry and intact (on 12-08-11)  . OSA on CPAP     ALLERGIES:  is allergic to  statins; ace inhibitors; amoxicillin; midazolam hcl; nsaids; prednisone; and sulfa antibiotics.  MEDICATIONS:  Current Outpatient Prescriptions  Medication Sig Dispense Refill  . albuterol (PROVENTIL HFA;VENTOLIN HFA) 108 (90 BASE) MCG/ACT inhaler Inhale 2 puffs into the lungs every 6 (six) hours as needed for wheezing or shortness of breath.    Marland Kitchen amLODipine (NORVASC) 5 MG tablet Take 5 mg by mouth every morning.      Marland Kitchen aspirin EC 325 MG tablet Take 325 mg by mouth every morning.     Marland Kitchen buPROPion (WELLBUTRIN XL) 150 MG 24 hr tablet Take 150 mg by mouth daily.     . cholecalciferol (VITAMIN D) 1000 UNITS tablet Take 1,000 Units by mouth every morning.     . clonazePAM (KLONOPIN) 1 MG tablet Take 1 mg by mouth 2 (two) times daily.  5  . DULoxetine (CYMBALTA) 60 MG capsule Take 60 mg by mouth at bedtime.     . fenofibrate (TRICOR) 48 MG tablet Take 48 mg by mouth every morning.   6  . ferrous sulfate 325 (65 FE) MG tablet Take 325 mg by mouth daily with breakfast.    . HYDROcodone-acetaminophen (NORCO) 5-325 MG per tablet Take 1 tablet by mouth 2 (two) times daily as needed for pain.     Marland Kitchen ibuprofen (ADVIL,MOTRIN) 200 MG tablet Take 600 mg by mouth every 6 (six) hours as needed for pain.    Marland Kitchen  methocarbamol (ROBAXIN) 500 MG tablet Take 500 mg by mouth 3 (three) times daily as needed (for muscle spasms).    . metoprolol tartrate (LOPRESSOR) 25 MG tablet Take 12.5 mg by mouth 2 (two) times daily.     Marland Kitchen PRESCRIPTION MEDICATION Inner Stem therapy for overactive bladder implant on the right side.    . Probiotic Product (PROBIOTIC PO) Take 1 tablet by mouth daily.    . traZODone (DESYREL) 50 MG tablet Take 50 mg by mouth at bedtime.      . vitamin B-12 (CYANOCOBALAMIN) 100 MCG tablet Take 100 mcg by mouth daily. Super B12 complex    . vitamin C (ASCORBIC ACID) 500 MG tablet Take 500 mg by mouth every morning.      No current facility-administered medications for this visit.    SURGICAL HISTORY:    Past Surgical History  Procedure Laterality Date  . Cardiac catheterization  2003  . Cysto/ hod/ bladder bx/ fulgeration  06-12-2011  . Cervical fusion  2008    C4 - T1  . Laminectomy  2007    L3 - 5  . Cysto/ hod/ bladder bx  2006  . Hysteroscopy w/d&c  2005  . Cervical discectomy  2002    C4 - 7  . Right thumb surg.  2001  . Cholecystectomy  1982  . Tonsillectomy and adenoidectomy  1965  . Interstim implant placement  12/07/2011    Procedure: INTERSTIM IMPLANT FIRST STAGE;  Surgeon: [name], MD;  Location: [facility];  Service: Urology;  Laterality: Right;  . Interstim implant placement  12/14/2011    Procedure: INTERSTIM IMPLANT SECOND STAGE;  Surgeon: [name], MD;  Location: [facility];  Service: Urology;  Laterality: Right;  rad tech ok by vickie at main  . Wrist surgery Left     [facility]    REVIEW OF SYSTEMS:  A comprehensive review of systems was negative except for: Constitutional: positive for fatigue Musculoskeletal: positive for back pain   PHYSICAL EXAMINATION: General appearance: alert, cooperative, fatigued and no distress Head: Normocephalic, without obvious abnormality, atraumatic Neck: no adenopathy, no JVD, supple, symmetrical, trachea midline and thyroid not enlarged, symmetric, no tenderness/mass/nodules Lymph nodes: Cervical, supraclavicular, and axillary nodes normal. Resp: clear to auscultation bilaterally Back: symmetric, no curvature. ROM normal. No CVA tenderness. Cardio: regular rate and rhythm, S1, S2 normal, no murmur, click, rub or gallop GI: soft, non-tender; bowel sounds normal; no masses,  no organomegaly Extremities: extremities normal, atraumatic, no cyanosis or edema Neurologic: Alert and oriented X 3, normal strength and tone. Normal symmetric reflexes. Normal coordination and gait  ECOG PERFORMANCE STATUS: 1 - Symptomatic but completely ambulatory  Blood pressure 144/65, pulse 64,  temperature 98.6 F (37 C), temperature source Oral, resp. rate 18, height 5' (1.524 m), weight 145 lb 12.8 oz (66.134 kg), last menstrual period 09/06/2005, SpO2 96 %.  LABORATORY DATA: Lab Results  Component Value Date   WBC 13.5* 10/11/2015   HGB 14.9 10/11/2015   HCT 44.5 10/11/2015   MCV 88.0 10/11/2015   PLT 279 10/11/2015      Chemistry      Component Value Date/Time   NA 141 09/29/2015 1220   NA 141 05/27/2015 0906   K 3.3* 09/29/2015 1220   K 4.2 05/27/2015 0906   CL 100* 09/29/2015 1220   CO2 27 09/29/2015 1203   CO2 27 05/27/2015 0906   BUN 14 09/29/2015 1220   BUN 17.4 05/27/2015 0906  CREATININE 1.00 09/29/2015 1220   CREATININE 1.2* 05/27/2015 0906      Component Value Date/Time   CALCIUM 9.6 09/29/2015 1203   CALCIUM 9.5 05/27/2015 0906   ALKPHOS 80 09/29/2015 1203   ALKPHOS 86 05/27/2015 0906   AST 46* 09/29/2015 1203   AST 26 05/27/2015 0906   ALT 40 09/29/2015 1203   ALT 24 05/27/2015 0906   BILITOT 0.5 09/29/2015 1203   BILITOT 0.37 05/27/2015 0906       RADIOGRAPHIC STUDIES: Ct Head Wo Contrast  09/29/2015  CLINICAL DATA:  Unable to grip things with left hand this morning. Weakness in left arm. EXAM: CT HEAD WITHOUT CONTRAST TECHNIQUE: Contiguous axial images were obtained from the base of the skull through the vertex without intravenous contrast. COMPARISON:  09/20/2015 FINDINGS: Patchy low-density throughout the deep white matter, nonspecific most likely chronic microvascular disease. No acute intracranial abnormality. Specifically, no hemorrhage, hydrocephalus, mass lesion, acute infarction, or significant intracranial injury. No acute calvarial abnormality. Visualized paranasal sinuses and mastoids clear. Orbital soft tissues unremarkable. IMPRESSION: No acute intracranial abnormality. Electronically Signed   By: Rolm Baptise M.D.   On: 09/29/2015 14:33   Ct Head Wo Contrast  09/20/2015  CLINICAL DATA:  Post traumatic headache after recent  fall. EXAM: CT HEAD WITHOUT CONTRAST TECHNIQUE: Contiguous axial images were obtained from the base of the skull through the vertex without intravenous contrast. COMPARISON:  CT scan of March 19, 2013. FINDINGS: Bony calvarium appears intact. Mild diffuse cortical atrophy is noted. Mild chronic ischemic white matter disease is noted. No mass effect or midline shift is noted. Ventricular size is within normal limits. There is no evidence of mass lesion, hemorrhage or acute infarction. IMPRESSION: Mild diffuse cortical atrophy. Mild chronic ischemic white matter disease. No acute intracranial abnormality seen. Electronically Signed   By: Marijo Conception, M.D.   On: 09/20/2015 14:35   US Carotid Bilateral  10/08/2015  CLINICAL DATA:  left arm weakness, TIA symptoms, hypertension, coronary disease, hyperlipidemia. EXAM: BILATERAL CAROTID DUPLEX ULTRASOUND TECHNIQUE: Pearline Cables scale imaging, color Doppler and duplex ultrasound were performed of bilateral carotid and vertebral arteries in the neck. COMPARISON:  None. FINDINGS: Criteria: Quantification of carotid stenosis is based on velocity parameters that correlate the residual internal carotid diameter with NASCET-based stenosis levels, using the diameter of the distal internal carotid lumen as the denominator for stenosis measurement. The following velocity measurements were obtained: RIGHT ICA:  115/31 cm/sec CCA:  42/68 cm/sec SYSTOLIC ICA/CCA RATIO:  1.4 DIASTOLIC ICA/CCA RATIO:  1.5 ECA:  107 cm/sec LEFT ICA:  125/33 cm/sec CCA:  341/96 cm/sec SYSTOLIC ICA/CCA RATIO:  1.2 DIASTOLIC ICA/CCA RATIO:  1.7 ECA:  115 cm/sec RIGHT CAROTID ARTERY: Minor echogenic shadowing plaque formation. No hemodynamically significant right ICA stenosis, velocity elevation, or turbulent flow. Degree of narrowing less than 50%. RIGHT VERTEBRAL ARTERY:  Antegrade LEFT CAROTID ARTERY: Similar scattered minor echogenic plaque formation. No hemodynamically significant left ICA stenosis,  velocity elevation, or turbulent flow. LEFT VERTEBRAL ARTERY:  Antegrade IMPRESSION: Minor carotid atherosclerosis. No hemodynamically significant ICA stenosis. Degree of narrowing less than 50% bilaterally. Electronically Signed   By: Jerilynn Mages.  Shick M.D.   On: 10/08/2015 12:13   Ct Biopsy  09/15/2015  CLINICAL DATA:  Leukocytosis EXAM: CT GUIDED LEFT ILIAC BONE MARROW ASPIRATION AND CORE BIOPSY Date:  9/28/20169/28/2016 10:04 am Radiologist:  M. Daryll Brod, MD Guidance:  CT FLUOROSCOPY TIME:  NONE. MEDICATIONS AND MEDICAL HISTORY: 150 mcg fentanyl ANESTHESIA/SEDATION: 15 minutes CONTRAST:  None. COMPLICATIONS:  None PROCEDURE: Informed consent was obtained from the patient following explanation of the procedure, risks, benefits and alternatives. The patient understands, agrees and consents for the procedure. All questions were addressed. A time out was performed. The patient was positioned prone and noncontrast localization CT was performed of the pelvis to demonstrate the iliac marrow spaces. Maximal barrier sterile technique utilized including caps, mask, sterile gowns, sterile gloves, large sterile drape, hand hygiene, and betadine prep. Under sterile conditions and local anesthesia, an 11 gauge coaxial bone biopsy needle was advanced into the left iliac marrow space. Needle position was confirmed with CT imaging. Initially, bone marrow aspiration was performed. Next, the 11 gauge outer cannula was utilized to obtain a left iliac bone marrow core biopsy. Needle was removed. Hemostasis was obtained with compression. The patient tolerated the procedure well. Samples were prepared with the cytotechnologist. No immediate complications. IMPRESSION: CT guided left iliac bone marrow aspiration and core biopsy. Electronically Signed   By: Jerilynn Mages.  Shick M.D.   On: 09/15/2015 15:04   Patient: [name], [name]: 09/15/2015 Client: [facility] Accession: [id number] Received: 09/15/2015 Gasper Sells,  MD DOB: 01/03/1956 Age: 3 Gender: F Reported: October 01, 2015 [address] Patient Ph: [phone] MRN #: [id number] [place], [place] [address] Visit #: [id number] Chart #: Phone: [phone] Fax: CC: Awilda Metro, PA-C Curt Bears, MD BONE MARROW REPORT FINAL DIAGNOSIS Diagnosis Bone Marrow, Aspirate,Biopsy, and Clot, left iliac - NORMOCELLULAR BONE MARROW WITH TRILINEAGE HEMATOPOIESIS. - SEE COMMENT. PERIPHERAL BLOOD: - LEUKOCYTOSIS WITH NEUTROPHILIA. Diagnosis Note The bone marrow is normocellular for age with essentially orderly and progressive maturation of all cell lines. There is no definite morphologic evidence of a myeloproliferative or myelodysplastic process. Nonetheless, correlation with cytogenetic studies is recommended. (BNS:gt, 10/01/2015) Susanne Greenhouse MD Pathologist, Electronic Signature (Case signed 10-01-15)  ASSESSMENT AND PLAN: This is a very pleasant 58 years old white female with persistent leukocytosis of unclear etiology at this point questionable to be reactive in nature  The previous molecular studies as well as the bone marrow biopsy and aspirate showed no concerning findings for myeloproliferative disorder. I had a lengthy discussion with the patient today about her condition. I recommended for the patient to continue on observation with routine follow-up visit with her primary care physician. I don't see a need to see the patient at regular basis at this point but will be happy to see her in the future if needed. For smoke cessation, I strongly encouraged the patient to quit smoking and offered her smoke cessation program. The patient was advised to call immediately if she has any concerning symptoms in the interval. The patient voices understanding of current disease status and treatment options and is in agreement with the current care plan.  All questions were answered. The patient knows to call the clinic with any problems, questions or concerns.  We can certainly see the patient much sooner if necessary.  Disclaimer: This note was dictated with voice recognition software. Similar sounding words can inadvertently be transcribed and may not be corrected upon review.

## 2015-10-13 DIAGNOSIS — I10 Essential (primary) hypertension: Secondary | ICD-10-CM | POA: Diagnosis not present

## 2015-10-13 DIAGNOSIS — M545 Low back pain: Secondary | ICD-10-CM | POA: Diagnosis not present

## 2015-10-13 DIAGNOSIS — F17210 Nicotine dependence, cigarettes, uncomplicated: Secondary | ICD-10-CM | POA: Diagnosis not present

## 2015-10-13 DIAGNOSIS — J4520 Mild intermittent asthma, uncomplicated: Secondary | ICD-10-CM | POA: Diagnosis not present

## 2015-10-14 ENCOUNTER — Other Ambulatory Visit (HOSPITAL_COMMUNITY): Payer: Medicare Other

## 2015-10-14 DIAGNOSIS — I340 Nonrheumatic mitral (valve) insufficiency: Secondary | ICD-10-CM | POA: Diagnosis not present

## 2015-10-14 DIAGNOSIS — I361 Nonrheumatic tricuspid (valve) insufficiency: Secondary | ICD-10-CM | POA: Diagnosis not present

## 2015-10-14 DIAGNOSIS — R42 Dizziness and giddiness: Secondary | ICD-10-CM | POA: Diagnosis not present

## 2015-10-14 DIAGNOSIS — I371 Nonrheumatic pulmonary valve insufficiency: Secondary | ICD-10-CM | POA: Diagnosis not present

## 2015-11-02 ENCOUNTER — Encounter: Payer: Self-pay | Admitting: *Deleted

## 2015-11-03 ENCOUNTER — Encounter: Payer: Self-pay | Admitting: Neurology

## 2015-11-03 ENCOUNTER — Ambulatory Visit (INDEPENDENT_AMBULATORY_CARE_PROVIDER_SITE_OTHER): Payer: Medicare Other | Admitting: Neurology

## 2015-11-03 VITALS — BP 100/68 | HR 56 | Ht 60.0 in | Wt 148.2 lb

## 2015-11-03 DIAGNOSIS — Z720 Tobacco use: Secondary | ICD-10-CM

## 2015-11-03 DIAGNOSIS — M6289 Other specified disorders of muscle: Secondary | ICD-10-CM

## 2015-11-03 DIAGNOSIS — G5601 Carpal tunnel syndrome, right upper limb: Secondary | ICD-10-CM | POA: Diagnosis not present

## 2015-11-03 DIAGNOSIS — R29898 Other symptoms and signs involving the musculoskeletal system: Secondary | ICD-10-CM

## 2015-11-03 NOTE — Patient Instructions (Signed)
1.  EMG of the upper extremities  2.  Continue aspirin daily 3.  If you develop any new weakness, go directly to the emergency department  Return to clinic in 3-4 months

## 2015-11-03 NOTE — Progress Notes (Signed)
[facility] Clinic Note - Initial Visit   Date: 11/03/2015  [name] MRN: [id number] DOB: 10-Dec-1956   Dear Dr. Justin Mend:  Thank you for your kind referral of [name] for consultation of right hand numbness and left hand weakness. Although her history is well known to you, please allow [name] to reiterate it for the purpose of our medical record. The patient was accompanied to the clinic by self.   History of Present Illness: [name] is a 59 y.o. left-handed Caucasian female with hypertension, CAD, depression, fibromyalgia, and tobacco use presenting for evaluation of right hand paresthesias and left hand weakness.    Over the past 10 years, she has developed intermittent spells of right hand tingling which became constant over the past year or so.  It involves all her fingertips, and is worse over the first three fingers. It does not radiate into the forearm.  It is more noticeable when she is driving, wakes her up from sleeping, or when doing repetitive activities.  She denies any weakness of the right hand or neck pain.    She had one episode of left hand weakness which occurred in October which occurred out of sleep.  She was sleeping and awoke to reach her phone, but developed weakness of the hand. She was concerned because she was unable to hold objects, even a cigarrete. No associated facial weakness or leg weakness. Her writing was not as good as it used to be, but this has improved. Over the course of a few days, her symptoms slowly improved.  She was evaluated at [facility] ED on 10/12 and had CT head and US carotids which was within normal limits.  She cannot get an MRI because of bladder stimulator.  She was evaluated in the ED by Dr. Janann Colonel, neurohospitalist, whose exam noted weakness of left finger adduction of 5th digit, flexion of distal 4th/5th digits, and abduction of fingers.  Because of her normal CT head and low suspicion for stroke, she  was recommended to follow-up with her primary care provider and have out-patient NCS/EMG.      Out-side paper records, electronic medical record, and images have been reviewed where available and summarized as:  US carotids 10/08/2015: Minor carotid atherosclerosis. No hemodynamically significant ICA stenosis. Degree of narrowing less than 50% bilaterally.  CT head wo contrast 09/29/2015:  No acute intracranial abnormality.  Past Medical History  Diagnosis Date  . Hypertension   . Coronary artery disease CARDIOLOGIST- DR  Doylene Canard  (VISIT 03-30-11 W/ CHART)    NON-OBS. CAD   (STRESS TEST NOV. 2011  . Asthma   . Non-productive cough   . Numbness in both hands AT TIMES  . DJD (degenerative joint disease)     JOINT PAIN  . DDD (degenerative disc disease)   . Fibromyalgia   . Depression   . Anxiety   . GERD (gastroesophageal reflux disease) AND HIATIAL HERNIA    CONTROLLED W/ NEXIUM  . History of gastric ulcer 2004  . IBS (irritable bowel syndrome)   . Hemorrhoids   . Urge urinary incontinence   . Frequency of urination   . Nocturia   . Iron deficiency anemia   . Agoraphobia without history of panic disorder   . Insomnia   . Incisional pain s/p interstim implant 1st stage--- 12-07-11     left upper buttock-- pt states dressing clean dry and intact (on 12-08-11)  . OSA on CPAP  Past Surgical History  Procedure Laterality Date  . Cardiac catheterization  2003  . Cysto/ hod/ bladder bx/ fulgeration  06-12-2011  . Cervical fusion  2008    C4 - T1  . Laminectomy  2007    L3 - 5  . Cysto/ hod/ bladder bx  2006  . Hysteroscopy w/d&c  2005  . Cervical discectomy  2002    C4 - 7  . Right thumb surg.  2001  . Cholecystectomy  1982  . Tonsillectomy and adenoidectomy  1965  . Interstim implant placement  12/07/2011    Procedure: INTERSTIM IMPLANT FIRST STAGE;  Surgeon: [name], MD;  Location: [facility];  Service: Urology;  Laterality: Right;  .  Interstim implant placement  12/14/2011    Procedure: INTERSTIM IMPLANT SECOND STAGE;  Surgeon: [name], MD;  Location: [facility];  Service: Urology;  Laterality: Right;  rad tech ok by vickie at [facility]  . Wrist surgery Left     [facility]     Medications:  Outpatient Encounter Prescriptions as of 11/03/2015  Medication Sig Note  . albuterol (PROVENTIL HFA;VENTOLIN HFA) 108 (90 BASE) MCG/ACT inhaler Inhale 2 puffs into the lungs every 6 (six) hours as needed for wheezing or shortness of breath.   Marland Kitchen amLODipine (NORVASC) 5 MG tablet Take 5 mg by mouth every morning.     Marland Kitchen aspirin EC 325 MG tablet Take 325 mg by mouth every morning.    Marland Kitchen buPROPion (WELLBUTRIN XL) 150 MG 24 hr tablet Take 150 mg by mouth daily.    . cholecalciferol (VITAMIN D) 1000 UNITS tablet Take 1,000 Units by mouth every morning.    . clonazePAM (KLONOPIN) 1 MG tablet Take 1 mg by mouth 2 (two) times daily.   . DULoxetine (CYMBALTA) 60 MG capsule Take 60 mg by mouth at bedtime.    . fenofibrate (TRICOR) 48 MG tablet Take 48 mg by mouth every morning.    . ferrous sulfate 325 (65 FE) MG tablet Take 325 mg by mouth daily with breakfast.   . HYDROcodone-acetaminophen (NORCO) 5-325 MG per tablet Take 1 tablet by mouth 2 (two) times daily as needed for pain.    Marland Kitchen ibuprofen (ADVIL,MOTRIN) 200 MG tablet Take 600 mg by mouth every 6 (six) hours as needed for pain.   . methocarbamol (ROBAXIN) 500 MG tablet Take 500 mg by mouth 3 (three) times daily as needed (for muscle spasms).   . metoprolol tartrate (LOPRESSOR) 25 MG tablet Take 12.5 mg by mouth 2 (two) times daily.    Marland Kitchen PRESCRIPTION MEDICATION Inner Stem therapy for overactive bladder implant on the right side. 09/29/2015: Placed December of 2012  . Probiotic Product (PROBIOTIC PO) Take 1 tablet by mouth daily.   . traZODone (DESYREL) 50 MG tablet Take 50 mg by mouth at bedtime.     . vitamin B-12 (CYANOCOBALAMIN) 100 MCG tablet Take 100 mcg by mouth daily.  Super B12 complex   . vitamin C (ASCORBIC ACID) 500 MG tablet Take 500 mg by mouth every morning.     No facility-administered encounter medications on file as of 11/03/2015.     Allergies:  Allergies  Allergen Reactions  . Statins Other (See Comments)    Causes muscle weakness and joint pain  . Ace Inhibitors Other (See Comments)    DECREASES HEARTRATE  . Amoxicillin Diarrhea  . Midazolam Hcl     HYPER  . Nsaids     AVOIDS HX GASTRIC ULCER  .  Prednisone Other (See Comments)    HYPER  . Sulfa Antibiotics Rash and Other (See Comments)    JOINT PAIN    Family History: Family History  Problem Relation Age of Onset  . Hypertension Father   . Heart disease Father   . Kidney failure Father   . Breast cancer Mother   . Hypertension Mother   . Coronary artery disease Mother   . Alzheimer's disease Mother   . Hypertension Brother   . Hypertension Sister   . Cancer Mother     Social History: Social History  Substance Use Topics  . Smoking status: Current Every Day Smoker -- 2.00 packs/day for 30 years  . Smokeless tobacco: Never Used  . Alcohol Use: No     Comment: OCCASIONAL   Social History   Social History Narrative   Lives with daughter, granddaughter and daughter's friend in a one story home.  On disability. Education: high school    Review of Systems:  CONSTITUTIONAL: No fevers, chills, night sweats, or weight loss.   EYES: No visual changes or eye pain ENT: No hearing changes.  No history of nose bleeds.   RESPIRATORY: No cough, wheezing and shortness of breath.   CARDIOVASCULAR: Negative for chest pain, and palpitations.   GI: Negative for abdominal discomfort, blood in stools or black stools.  No recent change in bowel habits.   GU:  No history of incontinence.   MUSCLOSKELETAL: +history of joint pain or swelling.  No myalgias.   SKIN: Negative for lesions, rash, and itching.   HEMATOLOGY/ONCOLOGY: Negative for prolonged bleeding, bruising easily, and  swollen nodes.  No history of cancer.   ENDOCRINE: Negative for cold or heat intolerance, polydipsia or goiter.   PSYCH:  +depression or anxiety symptoms.   NEURO: As Above.   Vital Signs:  BP 100/68 mmHg  Pulse 56  Ht 5' (1.524 m)  Wt 148 lb 4 oz (67.246 kg)  BMI 28.95 kg/m2  SpO2 97%  LMP 09/06/2005 Pain Scale: 0 on a scale of 0-10   General Medical Exam:   General:  Well appearing, comfortable.   Eyes/ENT: see cranial nerve examination.   Neck: No masses appreciated.  Full range of motion without tenderness.  No carotid bruits. Respiratory:  Clear to auscultation, good air entry bilaterally.   Cardiac:  Regular rate and rhythm, no murmur.   Extremities:  No deformities, edema, or skin discoloration.  Skin:  No rashes or lesions.  Neurological Exam: MENTAL STATUS including orientation to time, place, person, recent and remote memory, attention span and concentration, language, and fund of knowledge is normal.  Speech is not dysarthric.  CRANIAL NERVES: II:  No visual field defects.  Unremarkable fundi.   III-IV-VI: Pupils equal round and reactive to light.  Normal conjugate, extra-ocular eye movements in all directions of gaze.  No nystagmus.  No ptosis.   V:  Normal facial sensation.    VII:  Normal facial symmetry and movements.  No pathologic facial reflexes.  VIII:  Normal hearing and vestibular function.   IX-X:  Normal palatal movement.   XI:  Normal shoulder shrug and head rotation.   XII:  Normal tongue strength and range of motion, no deviation or fasciculation.  MOTOR:  Right ABP atrophy.  No fasciculations or abnormal movements.  No pronator drift.  Tone is normal.    Right Upper Extremity:    Left Upper Extremity:    Deltoid  5/5   Deltoid  5/5   Biceps  5/5   Biceps  5/5   Triceps  5/5   Triceps  5/5   Wrist extensors  5/5   Wrist extensors  5/5   Wrist flexors  5/5   Wrist flexors  5/5   Finger extensors  5/5   Finger extensors  5/5   Finger flexors  5/5    Finger flexors  5/5   Dorsal interossei  5/5   Dorsal interossei  5/5   Abductor pollicis  4+/5   Abductor pollicis  5/5   Tone (Ashworth scale)  0  Tone (Ashworth scale)  0   Right Lower Extremity:    Left Lower Extremity:    Hip flexors  5/5   Hip flexors  5/5   Hip extensors  5/5   Hip extensors  5/5   Knee flexors  5/5   Knee flexors  5/5   Knee extensors  5/5   Knee extensors  5/5   Dorsiflexors  5/5   Dorsiflexors  5/5   Plantarflexors  5/5   Plantarflexors  5/5   Toe extensors  5/5   Toe extensors  5/5   Toe flexors  5/5   Toe flexors  5/5   Tone (Ashworth scale)  0  Tone (Ashworth scale)  0   MSRs:  Right                                                                 Left brachioradialis 1+  brachioradialis 1+  biceps 1+  biceps 1+  triceps 1+  triceps 1+  patellar 1+  patellar 1+  ankle jerk 1+  ankle jerk 1+  Hoffman no  Hoffman no  plantar response down  plantar response down   SENSORY:  Normal and symmetric perception of light touch, pinprick, vibration, and proprioception.  Romberg's sign absent.   COORDINATION/GAIT: Normal finger-to- nose-finger and heel-to-shin.  Intact rapid alternating movements bilaterally.  Able to rise from a chair without using arms.  Gait narrow based and stable. Tandem and stressed gait intact.    IMPRESSION: Ms. Domina is a 59 year-old female referred for evaluation of right hand paresthesias and acute onset of left hand weakness, which has since improved.  Her exam today is notable for right thenar atrophy and weakness of thumb abduction as well as Tinel's sign positive over the wrist, all consistent with carpal tunnel syndrome.  Unfortunately, she has not had improvement with using a wrist splint, so EMG will be helpful to better characterize the nature of her symptoms.  Regarding her transient left hand weakness, it is unclear whether this represented a stroke or peripheral nerve injury such as ulnar neuropathy, which was mentioned by  the neurohospitalist.  Her CT brain did not demonstrate ischemic changes and US carotids did not reveal carotid stenosis.  She remains on a daily aspirin and has been complaint with medications.  We discussed repeating CT head to look for evolving changes, but since she is doing better and work-up to date has been negative, it would be reasonable to pursue NCS/EMG of the left upper extremity and have a low threshold to repeat CT/A head, if she developed new acute neurological symptoms.   PLAN/RECOMMENDATIONS:  1.  EMG of the upper extremities. Recommend wearing right wrist splint  for likely CTS 2.  Continue aspirin 325mg  daily 3.  Smoking cessation instruction/counseling given:  counseled patient on the dangers of tobacco use, advised patient to stop smoking, and reviewed strategies to maximize success 4.  Low threshold to repeat CT/A head, if she develops new neurological symptoms 5.  Stroke warning signs discussed and she was instructed to go to nearest emergency department if she develops any such symptoms   Return to clinic in 3-4 months   The duration of this appointment visit was 40 minutes of face-to-face time with the patient.  Greater than 50% of this time was spent in counseling, explanation of diagnosis, planning of further management, and coordination of care.   Thank you for allowing me to participate in patient's care.  If I can answer any additional questions, I would be pleased to do so.    Sincerely,    Swan [place] K. Posey Pronto, DO

## 2015-11-03 NOTE — Progress Notes (Signed)
Note sent

## 2015-11-15 DIAGNOSIS — M5441 Lumbago with sciatica, right side: Secondary | ICD-10-CM | POA: Diagnosis not present

## 2015-11-15 DIAGNOSIS — M5136 Other intervertebral disc degeneration, lumbar region: Secondary | ICD-10-CM | POA: Diagnosis not present

## 2015-11-15 DIAGNOSIS — G8929 Other chronic pain: Secondary | ICD-10-CM | POA: Diagnosis not present

## 2015-11-15 DIAGNOSIS — Z79891 Long term (current) use of opiate analgesic: Secondary | ICD-10-CM | POA: Diagnosis not present

## 2015-11-15 DIAGNOSIS — M7061 Trochanteric bursitis, right hip: Secondary | ICD-10-CM | POA: Diagnosis not present

## 2015-11-19 ENCOUNTER — Ambulatory Visit (INDEPENDENT_AMBULATORY_CARE_PROVIDER_SITE_OTHER): Payer: Medicare Other | Admitting: Neurology

## 2015-11-19 ENCOUNTER — Telehealth: Payer: Self-pay | Admitting: Neurology

## 2015-11-19 DIAGNOSIS — M5412 Radiculopathy, cervical region: Secondary | ICD-10-CM

## 2015-11-19 DIAGNOSIS — G5603 Carpal tunnel syndrome, bilateral upper limbs: Secondary | ICD-10-CM

## 2015-11-19 DIAGNOSIS — R29898 Other symptoms and signs involving the musculoskeletal system: Secondary | ICD-10-CM

## 2015-11-19 DIAGNOSIS — G5601 Carpal tunnel syndrome, right upper limb: Secondary | ICD-10-CM | POA: Diagnosis not present

## 2015-11-19 NOTE — Procedures (Signed)
[facility]  [place], [place]  [place], [place] [address] Tel: [phone] Fax:  [phone] Test Date:  11/19/2015  Patient: [name] DOB: [birth year] Physician: Narda Amber, DO  Sex: Female Height: 4\' 11"  Ref Phys: Narda Amber, DO  ID#: [id number] Temp: 33.0C Technician: Jerilynn Mages. Dean   Patient Complaints: This is a 59 year old female with history of two previous neck surgeries referred for evaluation of right hand paresthesias and weakness and acute onset of left hand weakness.   NCV & EMG Findings: Extensive electrodiagnostic testing of the right upper extremity and additional studies of the left shows: 1. Bilateral median sensory responses are prolonged (R5.4, L 4.5 ms) with preserved amplitude.  Bilateral ulnar sensory responses are within normal limits. 2. Bilateral medial motor responses show prolonged latency (R 5.3, L4.8 ms) and the right amplitude is also reduced (4.0 mV).  Bilateral ulnar motor responses are within normal limits. 3. Chronic motor axon loss changes are seen affecting bilateral abductor pollicis muscles, worse on the right, and deltoid muscles.   Impression: 1. Right median neuropathy at or distal to the wrist, consistent with the clinical diagnosis of carpal tunnel syndrome; severe in degree electrically.  2. Left median neuropathy at or distal to the wrist, consistent with the clinical diagnosis of carpal tunnel syndrome; moderate in degree electrically.  3. Residual findings of chronic C5 radiculopathy affecting bilateral upper extremities.  ___________________________ Narda Amber, DO    Nerve Conduction Studies Anti Sensory Summary Table   Site NR Peak (ms) Norm Peak (ms) P-T Amp (V) Norm P-T Amp  Left Median Anti Sensory (2nd Digit)  Wrist    4.5 <3.8 16.8 >10  Right Median Anti Sensory (2nd Digit)  32.6C  Wrist    5.4 <3.8 12.5 >10  Left Ulnar Anti Sensory (5th Digit)  Wrist    3.0 <3.2 17.7 >5  Right Ulnar Anti Sensory  (5th Digit)  32.6C  Wrist    3.1 <3.2 16.5 >5   Motor Summary Table   Site NR Onset (ms) Norm Onset (ms) O-P Amp (mV) Norm O-P Amp Site1 Site2 Delta-0 (ms) Dist (cm) Vel (m/s) Norm Vel (m/s)  Left Median Motor (Abd Poll Brev)  Wrist    4.8 <4.0 6.0 >5 Elbow Wrist 3.6 20.0 56 >50  Elbow    8.4  5.7         Right Median Motor (Abd Poll Brev)  32.6C  Wrist    5.3 <4.0 4.0 >5 Elbow Wrist 4.4 23.0 52 >50  Elbow    9.7  4.0         Left Ulnar Motor (Abd Dig Minimi)  Wrist    2.9 <3.1 8.2 >7 B Elbow Wrist 3.0 17.0 57 >50  B Elbow    5.9  8.2  A Elbow B Elbow 1.7 10.0 59 >50  A Elbow    7.6  7.9         Right Ulnar Motor (Abd Dig Minimi)  32.6C  Wrist    2.8 <3.1 10.3 >7 B Elbow Wrist 3.1 17.0 55 >50  B Elbow    5.9  9.0  A Elbow B Elbow 1.6 10.0 63 >50  A Elbow    7.5  8.8          EMG   Side Muscle Ins Act Fibs Psw Fasc Number Recrt Dur Dur. Amp Amp. Poly Poly. Comment  Right 1stDorInt Nml Nml Nml Nml Nml Nml Nml Nml Nml Nml Nml  Nml N/A  Right Deltoid Nml Nml Nml Nml 1- Rapid Some 1+ Some 1+ Nml Nml N/A  Right Abd Poll Brev Nml Nml Nml Nml 2- Rapid Many 1+ Some 1+ Nml Nml ATR  Right Ext Indicis Nml Nml Nml Nml Nml Nml Nml Nml Nml Nml Nml Nml N/A  Right PronatorTeres Nml Nml Nml Nml Nml Nml Nml Nml Nml Nml Nml Nml N/A  Right Biceps Nml Nml Nml Nml Nml Nml Nml Nml Nml Nml Nml Nml N/A  Right Triceps Nml Nml Nml Nml Nml Nml Nml Nml Nml Nml Nml Nml N/A  Left Abd Poll Brev Nml Nml Nml Nml 1- Rapid Some 1+ Nml Nml Nml Nml N/A  Left PronatorTeres Nml Nml Nml Nml Nml Nml Nml Nml Nml Nml Nml Nml N/A  Left Biceps Nml Nml Nml Nml Nml Nml Nml Nml Nml Nml Nml Nml N/A  Left Deltoid Nml Nml Nml Nml 1- Rapid Some 1+ Some 1+ Nml Nml N/A  Left Triceps Nml Nml Nml Nml Nml Nml Nml Nml Nml Nml Nml Nml N/A      Waveforms:

## 2015-11-19 NOTE — Telephone Encounter (Signed)
Results of EMG discussed with the patient which shows carpal tunnel bilaterally, severe on the right and moderate on the left. I recommended that she start using bilateral wrist splints and will also shared these results with her orthopedic specialist who may consider carpal tunnel release.  Brenda K. Posey Pronto, DO

## 2015-12-01 DIAGNOSIS — M5441 Lumbago with sciatica, right side: Secondary | ICD-10-CM | POA: Diagnosis not present

## 2015-12-02 ENCOUNTER — Telehealth: Payer: Self-pay | Admitting: Neurology

## 2015-12-02 NOTE — Telephone Encounter (Signed)
Dr. Posey Pronto had called her on 12/2 to discuss results, can you pls relay to her again what Dr. Posey Pronto had said and recommended. Thanks

## 2015-12-02 NOTE — Telephone Encounter (Signed)
Patient given results

## 2015-12-02 NOTE — Telephone Encounter (Signed)
Please advise 

## 2015-12-02 NOTE — Telephone Encounter (Signed)
Pt needs to know the results of the EMG please call [phone]

## 2015-12-07 DIAGNOSIS — M542 Cervicalgia: Secondary | ICD-10-CM | POA: Diagnosis not present

## 2015-12-07 DIAGNOSIS — Z6828 Body mass index (BMI) 28.0-28.9, adult: Secondary | ICD-10-CM | POA: Diagnosis not present

## 2015-12-07 DIAGNOSIS — I10 Essential (primary) hypertension: Secondary | ICD-10-CM | POA: Diagnosis not present

## 2015-12-19 DIAGNOSIS — I499 Cardiac arrhythmia, unspecified: Secondary | ICD-10-CM

## 2015-12-19 HISTORY — DX: Cardiac arrhythmia, unspecified: I49.9

## 2015-12-24 DIAGNOSIS — G5601 Carpal tunnel syndrome, right upper limb: Secondary | ICD-10-CM | POA: Diagnosis not present

## 2015-12-24 DIAGNOSIS — G5602 Carpal tunnel syndrome, left upper limb: Secondary | ICD-10-CM | POA: Diagnosis not present

## 2016-01-12 DIAGNOSIS — F17210 Nicotine dependence, cigarettes, uncomplicated: Secondary | ICD-10-CM | POA: Diagnosis not present

## 2016-01-12 DIAGNOSIS — I10 Essential (primary) hypertension: Secondary | ICD-10-CM | POA: Diagnosis not present

## 2016-01-12 DIAGNOSIS — J4520 Mild intermittent asthma, uncomplicated: Secondary | ICD-10-CM | POA: Diagnosis not present

## 2016-01-12 DIAGNOSIS — J208 Acute bronchitis due to other specified organisms: Secondary | ICD-10-CM | POA: Diagnosis not present

## 2016-02-10 DIAGNOSIS — R001 Bradycardia, unspecified: Secondary | ICD-10-CM | POA: Diagnosis not present

## 2016-02-10 DIAGNOSIS — F17210 Nicotine dependence, cigarettes, uncomplicated: Secondary | ICD-10-CM | POA: Diagnosis not present

## 2016-02-10 DIAGNOSIS — J4520 Mild intermittent asthma, uncomplicated: Secondary | ICD-10-CM | POA: Diagnosis not present

## 2016-02-10 DIAGNOSIS — I10 Essential (primary) hypertension: Secondary | ICD-10-CM | POA: Diagnosis not present

## 2016-02-10 DIAGNOSIS — R072 Precordial pain: Secondary | ICD-10-CM | POA: Diagnosis not present

## 2016-02-14 DIAGNOSIS — Z79891 Long term (current) use of opiate analgesic: Secondary | ICD-10-CM | POA: Diagnosis not present

## 2016-02-14 DIAGNOSIS — M5136 Other intervertebral disc degeneration, lumbar region: Secondary | ICD-10-CM | POA: Diagnosis not present

## 2016-02-14 DIAGNOSIS — G8929 Other chronic pain: Secondary | ICD-10-CM | POA: Diagnosis not present

## 2016-02-14 DIAGNOSIS — M5441 Lumbago with sciatica, right side: Secondary | ICD-10-CM | POA: Diagnosis not present

## 2016-02-18 ENCOUNTER — Ambulatory Visit (INDEPENDENT_AMBULATORY_CARE_PROVIDER_SITE_OTHER): Payer: Medicare Other | Admitting: Neurology

## 2016-02-18 ENCOUNTER — Encounter: Payer: Self-pay | Admitting: Neurology

## 2016-02-18 VITALS — BP 100/60 | HR 46 | Ht 59.5 in | Wt 145.6 lb

## 2016-02-18 DIAGNOSIS — G5603 Carpal tunnel syndrome, bilateral upper limbs: Secondary | ICD-10-CM | POA: Diagnosis not present

## 2016-02-18 DIAGNOSIS — G5602 Carpal tunnel syndrome, left upper limb: Secondary | ICD-10-CM | POA: Insufficient documentation

## 2016-02-18 DIAGNOSIS — M542 Cervicalgia: Secondary | ICD-10-CM | POA: Diagnosis not present

## 2016-02-18 HISTORY — DX: Carpal tunnel syndrome, bilateral upper limbs: G56.03

## 2016-02-18 MED ORDER — METHOCARBAMOL 500 MG PO TABS
500.0000 mg | ORAL_TABLET | Freq: Three times a day (TID) | ORAL | Status: DC | PRN
Start: 2016-02-18 — End: 2016-10-17

## 2016-02-18 NOTE — Patient Instructions (Addendum)
1.  Start robaxin 500mg  at bedtime as needed 2.  Follow-up with Dr. Caralyn Guile

## 2016-02-18 NOTE — Progress Notes (Signed)
Follow-up Visit   Date: 02/18/2016   [name] MRN: [id number] DOB: [birth year]   Interim History: [name] is a 60 y.o. left-handed Caucasian female with hypertension, CAD, depression, fibromyalgia, and tobacco use returning to the clinic for follow-up of bilateral CTS, worse on the right.  The patient was accompanied to the clinic by self.  History of present illness: Over the past 10 years, she has developed intermittent spells of right hand tingling which became constant over the past year or so. It involves all her fingertips, and is worse over the first three fingers. It does not radiate into the forearm. It is more noticeable when she is driving, wakes her up from sleeping, or when doing repetitive activities. She denies any weakness of the right hand or neck pain.   She had one episode of left hand weakness which occurred in October which occurred out of sleep. She was sleeping and awoke to reach her phone, but developed weakness of the hand. She was concerned because she was unable to hold objects, even a cigarrete. No associated facial weakness or leg weakness. Her writing was not as good as it used to be, but this has improved. Over the course of a few days, her symptoms slowly improved. She was evaluated at [facility] ED on 10/12 and had CT head and US carotids which was within normal limits. She cannot get an MRI because of bladder stimulator. She was evaluated in the ED by Dr. Janann Colonel, neurohospitalist, whose exam noted weakness of left finger adduction of 5th digit, flexion of distal 4th/5th digits, and abduction of fingers. Because of her normal CT head and low suspicion for stroke, she was recommended to follow-up with her primary care provider and have out-patient NCS/EMG.   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 Apolonio Schneiders who recommended CTS release and is planning on undergoing  surgery.  She has noticed worsening tingling in the hand.  Hand strength has been stable and occasionally she will have difficulty opening jars/bottles.  She complains of neck pain and stiffness which is causing new headaches.  No new neurological symptoms associated with headaches.  She has not cut back on smoking at all.   Medications:  Current Outpatient Prescriptions on File Prior to Visit  Medication Sig Dispense Refill  . albuterol (PROVENTIL HFA;VENTOLIN HFA) 108 (90 BASE) MCG/ACT inhaler Inhale 2 puffs into the lungs every 6 (six) hours as needed for wheezing or shortness of breath.    Marland Kitchen amLODipine (NORVASC) 5 MG tablet Take 5 mg by mouth every morning.      Marland Kitchen aspirin EC 325 MG tablet Take 325 mg by mouth every morning.     Marland Kitchen buPROPion (WELLBUTRIN XL) 150 MG 24 hr tablet Take 150 mg by mouth daily.     . cholecalciferol (VITAMIN D) 1000 UNITS tablet Take 1,000 Units by mouth every morning.     . clonazePAM (KLONOPIN) 1 MG tablet Take 1 mg by mouth 2 (two) times daily.  5  . DULoxetine (CYMBALTA) 60 MG capsule Take 60 mg by mouth at bedtime.     . fenofibrate (TRICOR) 48 MG tablet Take 48 mg by mouth every morning.   6  . ferrous sulfate 325 (65 FE) MG tablet Take 325 mg by mouth daily with breakfast.    . HYDROcodone-acetaminophen (NORCO) 5-325 MG per tablet Take 1 tablet by mouth 2 (two) times daily  as needed for pain.     Marland Kitchen ibuprofen (ADVIL,MOTRIN) 200 MG tablet Take 600 mg by mouth every 6 (six) hours as needed for pain.    . metoprolol tartrate (LOPRESSOR) 25 MG tablet Take 12.5 mg by mouth 2 (two) times daily.     Marland Kitchen PRESCRIPTION MEDICATION Inner Stem therapy for overactive bladder implant on the right side.    . Probiotic Product (PROBIOTIC PO) Take 1 tablet by mouth daily.    . traZODone (DESYREL) 50 MG tablet Take 50 mg by mouth at bedtime.      . vitamin B-12 (CYANOCOBALAMIN) 100 MCG tablet Take 100 mcg by mouth daily. Super B12 complex    . vitamin C (ASCORBIC ACID) 500 MG  tablet Take 500 mg by mouth every morning.      No current facility-administered medications on file prior to visit.    Allergies:  Allergies  Allergen Reactions  . Statins Other (See Comments)    Causes muscle weakness and joint pain  . Ace Inhibitors Other (See Comments)    DECREASES HEARTRATE  . Amoxicillin Diarrhea  . Midazolam Hcl     HYPER  . Nsaids     AVOIDS HX GASTRIC ULCER  . Prednisone Other (See Comments)    HYPER  . Sulfa Antibiotics Rash and Other (See Comments)    JOINT PAIN    Review of Systems:  CONSTITUTIONAL: No fevers, chills, night sweats, or weight loss.  EYES: No visual changes or eye pain ENT: No hearing changes.  No history of nose bleeds.   RESPIRATORY: No cough, wheezing and shortness of breath.   CARDIOVASCULAR: Negative for chest pain, and palpitations.   GI: Negative for abdominal discomfort, blood in stools or black stools.  No recent change in bowel habits.   GU:  No history of incontinence.   MUSCLOSKELETAL: No history of joint pain or swelling.  No myalgias.   SKIN: Negative for lesions, rash, and itching.   ENDOCRINE: Negative for cold or heat intolerance, polydipsia or goiter.   PSYCH:  No depression or anxiety symptoms.   NEURO: As Above.   Vital Signs:  BP 100/60 mmHg  Pulse 46  Ht 4' 11.5" (1.511 m)  Wt 145 lb 9 oz (66.027 kg)  BMI 28.92 kg/m2  SpO2 96%  LMP 09/06/2005  Neurological Exam: MENTAL STATUS including orientation to time, place, person, recent and remote memory, attention span and concentration, language, and fund of knowledge is normal.  Speech is not dysarthric.  CRANIAL NERVES: Pupils equal round and reactive to light.  Normal conjugate, extra-ocular eye movements in all directions of gaze.  Face is symmetric. Palate elevates symmetrically.  Tongue is midline.  MOTOR:  Motor strength is 5/5 in all extremities, except right ABP is 4/5 and shows moderate atrophy.  MSRs:  Reflexes are 2+/4 throughout  SENSORY:   Pin prick and temperature reduced over the median distribution bilaterally.  COORDINATION/GAIT:   Gait narrow based and stable.   Data: NCS/EMG of the upper extremities 11/19/2015: 1. Right median neuropathy at or distal to the wrist, consistent with the clinical diagnosis of carpal tunnel syndrome; severe in degree electrically.  2. Left median neuropathy at or distal to the wrist, consistent with the clinical diagnosis of carpal tunnel syndrome; moderate in degree electrically.  3. Residual findings of chronic C5 radiculopathy affecting bilateral upper extremities.  IMPRESSION/PLAN: 1.  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  Follow-up with Dr. Caralyn Guile for CTS release, continue to wear wrist splint on  left 2.  Cervicalgia causing headaches.  Refill provided for robaxin which has helped in the past.  Return to clinic as needed   The duration of this appointment visit was 25 minutes of face-to-face time with the patient.  Greater than 50% of this time was spent in counseling, explanation of diagnosis, planning of further management, and coordination of care.   Thank you for allowing me to participate in patient's care.  If I can answer any additional questions, I would be pleased to do so.    Sincerely,    Donika K. Posey Pronto, DO

## 2016-02-22 DIAGNOSIS — M4316 Spondylolisthesis, lumbar region: Secondary | ICD-10-CM | POA: Diagnosis not present

## 2016-02-22 DIAGNOSIS — M1711 Unilateral primary osteoarthritis, right knee: Secondary | ICD-10-CM | POA: Diagnosis not present

## 2016-02-22 DIAGNOSIS — M170 Bilateral primary osteoarthritis of knee: Secondary | ICD-10-CM | POA: Diagnosis not present

## 2016-02-22 DIAGNOSIS — M5136 Other intervertebral disc degeneration, lumbar region: Secondary | ICD-10-CM | POA: Diagnosis not present

## 2016-02-22 DIAGNOSIS — M1710 Unilateral primary osteoarthritis, unspecified knee: Secondary | ICD-10-CM | POA: Diagnosis not present

## 2016-02-22 DIAGNOSIS — M25561 Pain in right knee: Secondary | ICD-10-CM | POA: Diagnosis not present

## 2016-02-25 DIAGNOSIS — G5601 Carpal tunnel syndrome, right upper limb: Secondary | ICD-10-CM | POA: Diagnosis not present

## 2016-02-25 DIAGNOSIS — G5602 Carpal tunnel syndrome, left upper limb: Secondary | ICD-10-CM | POA: Diagnosis not present

## 2016-03-10 DIAGNOSIS — I2510 Atherosclerotic heart disease of native coronary artery without angina pectoris: Secondary | ICD-10-CM | POA: Diagnosis not present

## 2016-03-10 DIAGNOSIS — J4520 Mild intermittent asthma, uncomplicated: Secondary | ICD-10-CM | POA: Diagnosis not present

## 2016-03-10 DIAGNOSIS — I10 Essential (primary) hypertension: Secondary | ICD-10-CM | POA: Diagnosis not present

## 2016-03-10 DIAGNOSIS — M545 Low back pain: Secondary | ICD-10-CM | POA: Diagnosis not present

## 2016-03-10 DIAGNOSIS — F17210 Nicotine dependence, cigarettes, uncomplicated: Secondary | ICD-10-CM | POA: Diagnosis not present

## 2016-03-15 DIAGNOSIS — G5601 Carpal tunnel syndrome, right upper limb: Secondary | ICD-10-CM | POA: Diagnosis not present

## 2016-03-23 DIAGNOSIS — L02429 Furuncle of limb, unspecified: Secondary | ICD-10-CM | POA: Diagnosis not present

## 2016-03-23 DIAGNOSIS — I10 Essential (primary) hypertension: Secondary | ICD-10-CM | POA: Diagnosis not present

## 2016-03-23 DIAGNOSIS — I952 Hypotension due to drugs: Secondary | ICD-10-CM | POA: Diagnosis not present

## 2016-03-29 DIAGNOSIS — L02416 Cutaneous abscess of left lower limb: Secondary | ICD-10-CM | POA: Diagnosis not present

## 2016-03-29 DIAGNOSIS — E782 Mixed hyperlipidemia: Secondary | ICD-10-CM | POA: Diagnosis not present

## 2016-03-29 DIAGNOSIS — L0291 Cutaneous abscess, unspecified: Secondary | ICD-10-CM | POA: Diagnosis not present

## 2016-04-03 DIAGNOSIS — G4700 Insomnia, unspecified: Secondary | ICD-10-CM | POA: Diagnosis not present

## 2016-04-03 DIAGNOSIS — E782 Mixed hyperlipidemia: Secondary | ICD-10-CM | POA: Diagnosis not present

## 2016-04-03 DIAGNOSIS — F329 Major depressive disorder, single episode, unspecified: Secondary | ICD-10-CM | POA: Diagnosis not present

## 2016-04-03 DIAGNOSIS — L989 Disorder of the skin and subcutaneous tissue, unspecified: Secondary | ICD-10-CM | POA: Diagnosis not present

## 2016-04-03 DIAGNOSIS — I10 Essential (primary) hypertension: Secondary | ICD-10-CM | POA: Diagnosis not present

## 2016-04-04 DIAGNOSIS — G5601 Carpal tunnel syndrome, right upper limb: Secondary | ICD-10-CM | POA: Diagnosis not present

## 2016-04-04 DIAGNOSIS — Z4789 Encounter for other orthopedic aftercare: Secondary | ICD-10-CM | POA: Diagnosis not present

## 2016-04-06 DIAGNOSIS — L821 Other seborrheic keratosis: Secondary | ICD-10-CM | POA: Diagnosis not present

## 2016-04-06 DIAGNOSIS — L0390 Cellulitis, unspecified: Secondary | ICD-10-CM | POA: Diagnosis not present

## 2016-04-11 DIAGNOSIS — L0390 Cellulitis, unspecified: Secondary | ICD-10-CM | POA: Diagnosis not present

## 2016-04-11 DIAGNOSIS — D225 Melanocytic nevi of trunk: Secondary | ICD-10-CM | POA: Diagnosis not present

## 2016-04-11 DIAGNOSIS — D1801 Hemangioma of skin and subcutaneous tissue: Secondary | ICD-10-CM | POA: Diagnosis not present

## 2016-04-12 ENCOUNTER — Encounter: Payer: Self-pay | Admitting: Cardiology

## 2016-04-12 ENCOUNTER — Ambulatory Visit (INDEPENDENT_AMBULATORY_CARE_PROVIDER_SITE_OTHER): Payer: Medicare Other | Admitting: Cardiology

## 2016-04-12 VITALS — BP 132/74 | HR 84 | Ht 59.0 in | Wt 146.0 lb

## 2016-04-12 DIAGNOSIS — I2510 Atherosclerotic heart disease of native coronary artery without angina pectoris: Secondary | ICD-10-CM

## 2016-04-12 MED ORDER — ISOSORBIDE MONONITRATE ER 60 MG PO TB24: 60.0000 mg | ORAL_TABLET | Freq: Every day | ORAL | Status: DC

## 2016-04-12 NOTE — Progress Notes (Signed)
Cardiology Office Note   Date:  04/12/2016   ID:  NYAH KAO, DOB 04-05-1956, MRN [id number]  PCP:  [name], MD  Cardiologist:  Constance Haw, MD    Chief Complaint  Patient presents with  . New Patient (Initial Visit)  . Coronary Artery Disease     History of Present Illness: [name] is a 60 y.o. female who presents today for cardiology evaluation.   She has history of coronary artery disease, hypertension, hyperlipidemia. She says that she had a heart catheterization 2003 that showed minimal coronary artery disease. Since then she has had stress tests and echocardiograms yearly. She was recently seen by PA who found that her blood pressure and heart rate were low, and she was feeling fatigued time. Her metoprolol at that time was stopped. Since then her energy has been improved. She does not often get chest pain, but she did have an episode of chest pain a few weeks ago while at rest. She was previously put on 30 mg of Imdur to treat her chest discomfort.    Today, she denies symptoms of palpitations, chest pain, shortness of breath, orthopnea, PND, lower extremity edema, claudication, dizziness, presyncope, syncope, bleeding, or neurologic sequela. The patient is tolerating medications without difficulties and is otherwise without complaint today.    Past Medical History  Diagnosis Date  . Hypertension   . Coronary artery disease CARDIOLOGIST- DR  Doylene Canard  (VISIT 03-30-11 W/ CHART)    NON-OBS. CAD   (STRESS TEST NOV. 2011  . Asthma   . Non-productive cough   . Numbness in both hands AT TIMES  . DJD (degenerative joint disease)     JOINT PAIN  . DDD (degenerative disc disease)   . Fibromyalgia   . Depression   . Anxiety   . GERD (gastroesophageal reflux disease) AND HIATIAL HERNIA    CONTROLLED W/ NEXIUM  . History of gastric ulcer 2004  . IBS (irritable bowel syndrome)   . Hemorrhoids   . Urge urinary incontinence   . Frequency of urination   .  Nocturia   . Iron deficiency anemia   . Agoraphobia without history of panic disorder   . Insomnia   . Incisional pain s/p interstim implant 1st stage--- 12-07-11     left upper buttock-- pt states dressing clean dry and intact (on 12-08-11)  . OSA on CPAP    Past Surgical History  Procedure Laterality Date  . Cardiac catheterization  2003  . Cysto/ hod/ bladder bx/ fulgeration  06-12-2011  . Cervical fusion  2008    C4 - T1  . Laminectomy  2007    L3 - 5  . Cysto/ hod/ bladder bx  2006  . Hysteroscopy w/d&c  2005  . Cervical discectomy  2002    C4 - 7  . Right thumb surg.  2001  . Cholecystectomy  1982  . Tonsillectomy and adenoidectomy  1965  . Interstim implant placement  12/07/2011    Procedure: INTERSTIM IMPLANT FIRST STAGE;  Surgeon: [name], MD;  Location: [facility]  Service: Urology;  Laterality: Right;  . Interstim implant placement  12/14/2011    Procedure: INTERSTIM IMPLANT SECOND STAGE;  Surgeon: [name], MD;  Location: [facility];  Service: Urology;  Laterality: Right;  rad tech ok by vickie at [facility]  . Wrist surgery Left     [facility]     Current Outpatient Prescriptions  Medication Sig Dispense  Refill  . albuterol (PROVENTIL HFA;VENTOLIN HFA) 108 (90 BASE) MCG/ACT inhaler Inhale 2 puffs into the lungs every 6 (six) hours as needed for wheezing or shortness of breath.    Marland Kitchen amLODipine (NORVASC) 5 MG tablet Take 5 mg by mouth every morning.      . Ascorbic Acid (VITAMIN C) 1000 MG tablet Take 1,000 mg by mouth daily.    Marland Kitchen aspirin EC 325 MG tablet Take 325 mg by mouth every morning.     Marland Kitchen buPROPion (WELLBUTRIN XL) 150 MG 24 hr tablet Take 150 mg by mouth daily.     . cholecalciferol (VITAMIN D) 1000 UNITS tablet Take 1,000 Units by mouth every morning.     . clonazePAM (KLONOPIN) 1 MG tablet Take 1 mg by mouth 2 (two) times daily.  5  . DULoxetine (CYMBALTA) 60 MG capsule Take 60 mg by mouth at bedtime.     .  fenofibrate (TRICOR) 48 MG tablet Take 48 mg by mouth every morning.   6  . ferrous sulfate 325 (65 FE) MG tablet Take 325 mg by mouth daily with breakfast.    . HYDROcodone-acetaminophen (NORCO) 5-325 MG per tablet Take 1 tablet by mouth 2 (two) times daily as needed for pain.     Marland Kitchen ibuprofen (ADVIL,MOTRIN) 200 MG tablet Take 600 mg by mouth every 6 (six) hours as needed for pain.    . isosorbide mononitrate (IMDUR) 30 MG 24 hr tablet Take 30 mg by mouth daily.    . meclizine (ANTIVERT) 12.5 MG tablet TAKE 1 TABLET 3 TIMES A DAY AS NEEDED  0  . methocarbamol (ROBAXIN) 500 MG tablet Take 1 tablet (500 mg total) by mouth every 8 (eight) hours as needed (for muscle spasms). 45 tablet 3  . PRESCRIPTION MEDICATION Inner Stem therapy for overactive bladder implant on the right side.    . traZODone (DESYREL) 50 MG tablet Take 50 mg by mouth at bedtime.      . vitamin B-12 (CYANOCOBALAMIN) 100 MCG tablet Take 100 mcg by mouth daily. Super B12 complex     No current facility-administered medications for this visit.    Allergies:   Statins; Ace inhibitors; Amoxicillin; Midazolam hcl; Nsaids; Prednisone; and Sulfa antibiotics   Social History:  The patient  reports that she has been smoking.  She has never used smokeless tobacco. She reports that she does not drink alcohol or use illicit drugs.   Family History:  The patient's family history includes Alzheimer's disease in her mother; Breast cancer in her mother; Coronary artery disease in her mother; Heart disease in her father; Hypertension in her brother, daughter, father, mother, and sister; Kidney failure in her father.    ROS:  Please see the history of present illness.   Otherwise, review of systems is positive for chest pain, leg swelling, palpitations, cough, DOE, snoring, diarrhea, nausea, depression, anxiety, back/muscle pin, dizziness.   All other systems are reviewed and negative.    PHYSICAL EXAM: VS:  BP 132/74 mmHg  Pulse 84  Ht 4'  11" (1.499 m)  Wt 146 lb (66.225 kg)  BMI 29.47 kg/m2  LMP 09/06/2005 , BMI Body mass index is 29.47 kg/(m^2). GEN: Well nourished, well developed, in no acute distress HEENT: normal Neck: no JVD, carotid bruits, or masses Cardiac: RRR; no murmurs, rubs, or gallops,no edema  Respiratory:  clear to auscultation bilaterally, normal work of breathing GI: soft, nontender, nondistended, + BS MS: no deformity or atrophy Skin: warm and dry Neuro:  Strength and sensation are intact Psych: euthymic mood, full affect  EKG:  EKG is ordered today. The ekg ordered today shows sinus rhythm, APCs, poor R wave progression  Recent Labs: 10/11/2015: ALT 37; BUN 13.2; Creatinine 1.0; HGB 14.9; Platelets 279; Potassium 3.4*; Sodium 142    Lipid Panel  No results found for: CHOL, TRIG, HDL, CHOLHDL, VLDL, LDLCALC, LDLDIRECT   Wt Readings from Last 3 Encounters:  04/12/16 146 lb (66.225 kg)  02/18/16 145 lb 9 oz (66.027 kg)  11/03/15 148 lb 4 oz (67.246 kg)      Other studies Reviewed: Additional studies/ records that were reviewed today include: PCP notes    ASSESSMENT AND PLAN:  1.  Coronary artery disease: She has known coronary artery disease from a heart catheterization in 2003. She has had yearly echocardiograms and stress tests. We do not have the records of this, so we Ryzen Deady call her prior cardiologist office to obtain those records. She is currently having occasional chest discomfort. I Jamica Woodyard therefore increase her Imdur to 60 mg from 30 mg. I Hektor Huston also decrease her aspirin to 81 mg daily.   2. Hypertension: Currently well controlled, we'll make no medical changes at this time.  3. Hyperlipidemia: The patient had reactions to multiple statins, and has been unable to tolerate that class of medications. She is currently on ascorbic acid. I Yamel Bale refer her to lipid clinic to further determine if she is a candidate for other medications.    Current medicines are reviewed at length with the  patient today.   The patient does not have concerns regarding her medicines.  The following changes were made today:  Increase Imdur to 60 mg, decrease aspirin to 81 mg  Labs/ tests ordered today include:  No orders of the defined types were placed in this encounter.     Disposition:   FU with [facility] 6 months  Signed, Melicia Esqueda Meredith Leeds, MD  04/12/2016 11:26 AM     [facility] [address] [place] [address] [phone] (office) [phone] (fax)

## 2016-04-12 NOTE — Patient Instructions (Signed)
Medication Instructions:  Your physician has recommended you make the following change in your medication:  1) Increase Imdur to 60mg  daily   Labwork: None ordered   Testing/Procedures: None ordered   Follow-Up: Your physician wants you to follow-up in: 6 months with Dr Cora Daniels will receive a reminder letter in the mail two months in advance. If you don't receive a letter, please call our office to schedule the follow-up appointment.   Any Other Special Instructions Will Be Listed Below (If Applicable).     If you need a refill on your cardiac medications before your next appointment, please call your pharmacy.

## 2016-05-02 DIAGNOSIS — G5601 Carpal tunnel syndrome, right upper limb: Secondary | ICD-10-CM | POA: Diagnosis not present

## 2016-05-02 DIAGNOSIS — Z4789 Encounter for other orthopedic aftercare: Secondary | ICD-10-CM | POA: Diagnosis not present

## 2016-05-08 DIAGNOSIS — M5136 Other intervertebral disc degeneration, lumbar region: Secondary | ICD-10-CM | POA: Diagnosis not present

## 2016-05-08 DIAGNOSIS — M7061 Trochanteric bursitis, right hip: Secondary | ICD-10-CM | POA: Diagnosis not present

## 2016-05-08 DIAGNOSIS — M4316 Spondylolisthesis, lumbar region: Secondary | ICD-10-CM | POA: Diagnosis not present

## 2016-05-11 DIAGNOSIS — J45901 Unspecified asthma with (acute) exacerbation: Secondary | ICD-10-CM | POA: Diagnosis not present

## 2016-05-12 DIAGNOSIS — Z803 Family history of malignant neoplasm of breast: Secondary | ICD-10-CM | POA: Diagnosis not present

## 2016-05-12 DIAGNOSIS — Z1231 Encounter for screening mammogram for malignant neoplasm of breast: Secondary | ICD-10-CM | POA: Diagnosis not present

## 2016-06-13 DIAGNOSIS — Z4789 Encounter for other orthopedic aftercare: Secondary | ICD-10-CM | POA: Diagnosis not present

## 2016-07-27 DIAGNOSIS — M5136 Other intervertebral disc degeneration, lumbar region: Secondary | ICD-10-CM | POA: Diagnosis not present

## 2016-07-27 DIAGNOSIS — M5441 Lumbago with sciatica, right side: Secondary | ICD-10-CM | POA: Diagnosis not present

## 2016-07-27 DIAGNOSIS — Z79891 Long term (current) use of opiate analgesic: Secondary | ICD-10-CM | POA: Diagnosis not present

## 2016-08-21 ENCOUNTER — Telehealth: Payer: Self-pay | Admitting: Cardiology

## 2016-08-21 ENCOUNTER — Encounter (HOSPITAL_COMMUNITY): Payer: Self-pay | Admitting: *Deleted

## 2016-08-21 ENCOUNTER — Emergency Department (HOSPITAL_COMMUNITY): Payer: Medicare Other

## 2016-08-21 ENCOUNTER — Observation Stay (HOSPITAL_COMMUNITY)
Admission: EM | Admit: 2016-08-21 | Discharge: 2016-08-24 | Disposition: A | Discharge status: Home / Self Care | Payer: Medicare Other | Attending: Internal Medicine | Admitting: Internal Medicine

## 2016-08-21 DIAGNOSIS — K219 Gastro-esophageal reflux disease without esophagitis: Secondary | ICD-10-CM | POA: Diagnosis present

## 2016-08-21 DIAGNOSIS — R079 Chest pain, unspecified: Secondary | ICD-10-CM | POA: Diagnosis present

## 2016-08-21 DIAGNOSIS — K589 Irritable bowel syndrome without diarrhea: Secondary | ICD-10-CM | POA: Insufficient documentation

## 2016-08-21 DIAGNOSIS — R0602 Shortness of breath: Secondary | ICD-10-CM | POA: Diagnosis not present

## 2016-08-21 DIAGNOSIS — Z9119 Patient's noncompliance with other medical treatment and regimen: Secondary | ICD-10-CM | POA: Insufficient documentation

## 2016-08-21 DIAGNOSIS — J449 Chronic obstructive pulmonary disease, unspecified: Secondary | ICD-10-CM | POA: Insufficient documentation

## 2016-08-21 DIAGNOSIS — I471 Supraventricular tachycardia, unspecified: Secondary | ICD-10-CM | POA: Diagnosis present

## 2016-08-21 DIAGNOSIS — I480 Paroxysmal atrial fibrillation: Secondary | ICD-10-CM | POA: Diagnosis not present

## 2016-08-21 DIAGNOSIS — D72829 Elevated white blood cell count, unspecified: Secondary | ICD-10-CM | POA: Diagnosis present

## 2016-08-21 DIAGNOSIS — R0789 Other chest pain: Secondary | ICD-10-CM | POA: Diagnosis not present

## 2016-08-21 DIAGNOSIS — Z7982 Long term (current) use of aspirin: Secondary | ICD-10-CM | POA: Insufficient documentation

## 2016-08-21 DIAGNOSIS — F32A Depression, unspecified: Secondary | ICD-10-CM | POA: Diagnosis present

## 2016-08-21 DIAGNOSIS — I10 Essential (primary) hypertension: Secondary | ICD-10-CM | POA: Diagnosis present

## 2016-08-21 DIAGNOSIS — Z8711 Personal history of peptic ulcer disease: Secondary | ICD-10-CM | POA: Insufficient documentation

## 2016-08-21 DIAGNOSIS — Z880 Allergy status to penicillin: Secondary | ICD-10-CM | POA: Diagnosis not present

## 2016-08-21 DIAGNOSIS — R000 Tachycardia, unspecified: Secondary | ICD-10-CM | POA: Diagnosis not present

## 2016-08-21 DIAGNOSIS — M797 Fibromyalgia: Secondary | ICD-10-CM | POA: Insufficient documentation

## 2016-08-21 DIAGNOSIS — I2510 Atherosclerotic heart disease of native coronary artery without angina pectoris: Secondary | ICD-10-CM | POA: Diagnosis present

## 2016-08-21 DIAGNOSIS — I25110 Atherosclerotic heart disease of native coronary artery with unstable angina pectoris: Principal | ICD-10-CM | POA: Insufficient documentation

## 2016-08-21 DIAGNOSIS — F419 Anxiety disorder, unspecified: Secondary | ICD-10-CM | POA: Diagnosis not present

## 2016-08-21 DIAGNOSIS — E876 Hypokalemia: Secondary | ICD-10-CM | POA: Diagnosis not present

## 2016-08-21 DIAGNOSIS — G5603 Carpal tunnel syndrome, bilateral upper limbs: Secondary | ICD-10-CM | POA: Insufficient documentation

## 2016-08-21 DIAGNOSIS — Z8719 Personal history of other diseases of the digestive system: Secondary | ICD-10-CM | POA: Diagnosis not present

## 2016-08-21 DIAGNOSIS — G4733 Obstructive sleep apnea (adult) (pediatric): Secondary | ICD-10-CM | POA: Insufficient documentation

## 2016-08-21 DIAGNOSIS — F17200 Nicotine dependence, unspecified, uncomplicated: Secondary | ICD-10-CM | POA: Diagnosis not present

## 2016-08-21 DIAGNOSIS — N3020 Other chronic cystitis without hematuria: Secondary | ICD-10-CM | POA: Diagnosis not present

## 2016-08-21 DIAGNOSIS — F329 Major depressive disorder, single episode, unspecified: Secondary | ICD-10-CM | POA: Diagnosis present

## 2016-08-21 DIAGNOSIS — Z720 Tobacco use: Secondary | ICD-10-CM | POA: Diagnosis present

## 2016-08-21 LAB — BASIC METABOLIC PANEL
ANION GAP: 6 (ref 5–15)
BUN: 9 mg/dL (ref 6–20)
CALCIUM: 9.5 mg/dL (ref 8.9–10.3)
CO2: 25 mmol/L (ref 22–32)
CREATININE: 0.98 mg/dL (ref 0.44–1.00)
Chloride: 107 mmol/L (ref 101–111)
Glucose, Bld: 123 mg/dL — ABNORMAL HIGH (ref 65–99)
Potassium: 2.8 mmol/L — ABNORMAL LOW (ref 3.5–5.1)
SODIUM: 138 mmol/L (ref 135–145)

## 2016-08-21 LAB — CBC
HCT: 45.4 % (ref 36.0–46.0)
HEMOGLOBIN: 15.2 g/dL — AB (ref 12.0–15.0)
MCH: 30.5 pg (ref 26.0–34.0)
MCHC: 33.5 g/dL (ref 30.0–36.0)
MCV: 91.2 fL (ref 78.0–100.0)
PLATELETS: 296 10*3/uL (ref 150–400)
RBC: 4.98 MIL/uL (ref 3.87–5.11)
RDW: 12.7 % (ref 11.5–15.5)
WBC: 15.4 10*3/uL — AB (ref 4.0–10.5)

## 2016-08-21 LAB — I-STAT TROPONIN, ED: TROPONIN I, POC: 0 ng/mL (ref 0.00–0.08)

## 2016-08-21 NOTE — Telephone Encounter (Signed)
The patient called with HTN and a heart rate that she reported to be 150.  She felt "jittery" but was not having pain or presyncope.  I asked her to call 911 to be evaluated by EMS.  She agrees to this.

## 2016-08-21 NOTE — ED Triage Notes (Signed)
Pt to ED by EMS c/o anxiety and tachycardia. Initial pulse, 150-160 in SVT. EMS gave 6mg , 12mg , and 12mg  of adenosine without improvement. Pt also given 20mg  cardizem with decrease in pulse to 120-130. Pt reports similar episode last week, took a muscle relaxer and symptoms resolved

## 2016-08-22 ENCOUNTER — Encounter (HOSPITAL_COMMUNITY): Payer: Self-pay | Admitting: Internal Medicine

## 2016-08-22 ENCOUNTER — Other Ambulatory Visit (HOSPITAL_COMMUNITY): Payer: Medicare Other

## 2016-08-22 DIAGNOSIS — K219 Gastro-esophageal reflux disease without esophagitis: Secondary | ICD-10-CM | POA: Diagnosis not present

## 2016-08-22 DIAGNOSIS — M797 Fibromyalgia: Secondary | ICD-10-CM | POA: Diagnosis not present

## 2016-08-22 DIAGNOSIS — F32A Depression, unspecified: Secondary | ICD-10-CM | POA: Diagnosis present

## 2016-08-22 DIAGNOSIS — E876 Hypokalemia: Secondary | ICD-10-CM | POA: Diagnosis not present

## 2016-08-22 DIAGNOSIS — Z9989 Dependence on other enabling machines and devices: Secondary | ICD-10-CM

## 2016-08-22 DIAGNOSIS — F329 Major depressive disorder, single episode, unspecified: Secondary | ICD-10-CM | POA: Diagnosis not present

## 2016-08-22 DIAGNOSIS — I471 Supraventricular tachycardia: Secondary | ICD-10-CM | POA: Diagnosis present

## 2016-08-22 DIAGNOSIS — I25110 Atherosclerotic heart disease of native coronary artery with unstable angina pectoris: Secondary | ICD-10-CM | POA: Diagnosis not present

## 2016-08-22 DIAGNOSIS — I2510 Atherosclerotic heart disease of native coronary artery without angina pectoris: Secondary | ICD-10-CM | POA: Diagnosis present

## 2016-08-22 DIAGNOSIS — G4733 Obstructive sleep apnea (adult) (pediatric): Secondary | ICD-10-CM | POA: Insufficient documentation

## 2016-08-22 DIAGNOSIS — I10 Essential (primary) hypertension: Secondary | ICD-10-CM | POA: Diagnosis not present

## 2016-08-22 DIAGNOSIS — Z8719 Personal history of other diseases of the digestive system: Secondary | ICD-10-CM | POA: Diagnosis not present

## 2016-08-22 DIAGNOSIS — Z8711 Personal history of peptic ulcer disease: Secondary | ICD-10-CM | POA: Diagnosis not present

## 2016-08-22 DIAGNOSIS — F419 Anxiety disorder, unspecified: Secondary | ICD-10-CM

## 2016-08-22 DIAGNOSIS — R079 Chest pain, unspecified: Secondary | ICD-10-CM | POA: Diagnosis not present

## 2016-08-22 DIAGNOSIS — R0789 Other chest pain: Secondary | ICD-10-CM | POA: Diagnosis not present

## 2016-08-22 DIAGNOSIS — F17210 Nicotine dependence, cigarettes, uncomplicated: Secondary | ICD-10-CM | POA: Diagnosis not present

## 2016-08-22 DIAGNOSIS — J449 Chronic obstructive pulmonary disease, unspecified: Secondary | ICD-10-CM | POA: Diagnosis not present

## 2016-08-22 LAB — MAGNESIUM: Magnesium: 2 mg/dL (ref 1.7–2.4)

## 2016-08-22 LAB — CBC WITH DIFFERENTIAL/PLATELET
Basophils Absolute: 0 10*3/uL (ref 0.0–0.1)
Basophils Relative: 0 %
EOS PCT: 3 %
Eosinophils Absolute: 0.4 10*3/uL (ref 0.0–0.7)
HEMATOCRIT: 39.7 % (ref 36.0–46.0)
HEMOGLOBIN: 13.2 g/dL (ref 12.0–15.0)
Lymphocytes Relative: 20 %
Lymphs Abs: 2.9 10*3/uL (ref 0.7–4.0)
MCH: 30.1 pg (ref 26.0–34.0)
MCHC: 33.2 g/dL (ref 30.0–36.0)
MCV: 90.4 fL (ref 78.0–100.0)
MONOS PCT: 6 %
Monocytes Absolute: 0.9 10*3/uL (ref 0.1–1.0)
NEUTROS PCT: 71 %
Neutro Abs: 10.5 10*3/uL — ABNORMAL HIGH (ref 1.7–7.7)
Platelets: 261 10*3/uL (ref 150–400)
RBC: 4.39 MIL/uL (ref 3.87–5.11)
RDW: 12.7 % (ref 11.5–15.5)
WBC: 14.7 10*3/uL — AB (ref 4.0–10.5)

## 2016-08-22 LAB — LIPID PANEL
CHOL/HDL RATIO: 4.7 ratio
CHOLESTEROL: 160 mg/dL (ref 0–200)
HDL: 34 mg/dL — AB (ref >40)
LDL Cholesterol: 100 mg/dL — ABNORMAL HIGH (ref 0–99)
Triglycerides: 131 mg/dL (ref <150)
VLDL: 26 mg/dL (ref 0–40)

## 2016-08-22 LAB — PROTIME-INR
INR: 1.02
Prothrombin Time: 13.4 seconds (ref 11.4–15.2)

## 2016-08-22 LAB — RAPID URINE DRUG SCREEN, HOSP PERFORMED
Amphetamines: NOT DETECTED
BARBITURATES: NOT DETECTED
Benzodiazepines: NOT DETECTED
COCAINE: NOT DETECTED
Opiates: POSITIVE — AB
Tetrahydrocannabinol: NOT DETECTED

## 2016-08-22 LAB — TSH: TSH: 2.935 u[IU]/mL (ref 0.350–4.500)

## 2016-08-22 LAB — TROPONIN I
Troponin I: 0.05 ng/mL (ref <0.03)
Troponin I: 0.04 ng/mL (ref <0.03)
Troponin I: 0.03 ng/mL (ref <0.03)

## 2016-08-22 MED ORDER — B COMPLEX-C PO TABS
1.0000 | ORAL_TABLET | Freq: Every day | ORAL | Status: DC
  Administered 2016-08-22 – 2016-08-24 (×3): 1 via ORAL
  Filled 2016-08-22 (×4): qty 1

## 2016-08-22 MED ORDER — POTASSIUM CHLORIDE IN NACL 20-0.45 MEQ/L-% IV SOLN
INTRAVENOUS | Status: DC
  Administered 2016-08-22: 02:00:00 via INTRAVENOUS
  Filled 2016-08-22 (×2): qty 1000

## 2016-08-22 MED ORDER — SODIUM CHLORIDE 0.9 % IV SOLN
INTRAVENOUS | Status: DC
  Administered 2016-08-22: 17:00:00 via INTRAVENOUS

## 2016-08-22 MED ORDER — FENOFIBRATE 54 MG PO TABS
54.0000 mg | ORAL_TABLET | Freq: Every day | ORAL | Status: DC
  Administered 2016-08-22 – 2016-08-24 (×3): 54 mg via ORAL
  Filled 2016-08-22 (×3): qty 1

## 2016-08-22 MED ORDER — BUPROPION HCL ER (XL) 150 MG PO TB24
150.0000 mg | ORAL_TABLET | Freq: Every day | ORAL | Status: DC
  Administered 2016-08-22 – 2016-08-24 (×3): 150 mg via ORAL
  Filled 2016-08-22 (×3): qty 1

## 2016-08-22 MED ORDER — VITAMIN C 500 MG PO TABS
1000.0000 mg | ORAL_TABLET | Freq: Every day | ORAL | Status: DC
  Administered 2016-08-22 – 2016-08-24 (×3): 1000 mg via ORAL
  Filled 2016-08-22 (×3): qty 2

## 2016-08-22 MED ORDER — METHOCARBAMOL 500 MG PO TABS
500.0000 mg | ORAL_TABLET | Freq: Three times a day (TID) | ORAL | Status: DC | PRN
  Administered 2016-08-22: 500 mg via ORAL
  Filled 2016-08-22: qty 1

## 2016-08-22 MED ORDER — ENOXAPARIN SODIUM 40 MG/0.4ML ~~LOC~~ SOLN
40.0000 mg | Freq: Every day | SUBCUTANEOUS | Status: DC
  Administered 2016-08-22 – 2016-08-24 (×3): 40 mg via SUBCUTANEOUS
  Filled 2016-08-22 (×3): qty 0.4

## 2016-08-22 MED ORDER — ASPIRIN 81 MG PO CHEW
324.0000 mg | CHEWABLE_TABLET | Freq: Once | ORAL | Status: AC
  Administered 2016-08-22: 324 mg via ORAL
  Filled 2016-08-22: qty 4

## 2016-08-22 MED ORDER — POTASSIUM CHLORIDE CRYS ER 20 MEQ PO TBCR
40.0000 meq | EXTENDED_RELEASE_TABLET | Freq: Once | ORAL | Status: AC
  Administered 2016-08-22: 40 meq via ORAL
  Filled 2016-08-22: qty 2

## 2016-08-22 MED ORDER — NICOTINE 21 MG/24HR TD PT24
21.0000 mg | MEDICATED_PATCH | Freq: Every day | TRANSDERMAL | Status: DC
  Administered 2016-08-22 – 2016-08-24 (×3): 21 mg via TRANSDERMAL
  Filled 2016-08-22 (×3): qty 1

## 2016-08-22 MED ORDER — POTASSIUM CHLORIDE 20 MEQ/15ML (10%) PO SOLN
40.0000 meq | Freq: Once | ORAL | Status: AC
  Administered 2016-08-22: 40 meq via ORAL
  Filled 2016-08-22: qty 30

## 2016-08-22 MED ORDER — MORPHINE SULFATE (PF) 2 MG/ML IV SOLN: 2.0000 mg | Freq: Once | INTRAVENOUS | Status: DC | PRN

## 2016-08-22 MED ORDER — SUPER B COMPLEX/C PO CAPS: 1.0000 | ORAL_CAPSULE | ORAL | Status: DC

## 2016-08-22 MED ORDER — SODIUM CHLORIDE 0.9 % IV SOLN
INTRAVENOUS | Status: DC
  Administered 2016-08-22: 04:00:00 via INTRAVENOUS

## 2016-08-22 MED ORDER — METOPROLOL TARTRATE 12.5 MG HALF TABLET
12.5000 mg | ORAL_TABLET | Freq: Two times a day (BID) | ORAL | Status: DC
  Administered 2016-08-22 – 2016-08-24 (×4): 12.5 mg via ORAL
  Filled 2016-08-22 (×4): qty 1

## 2016-08-22 MED ORDER — ONDANSETRON HCL 4 MG/2ML IJ SOLN
4.0000 mg | Freq: Once | INTRAMUSCULAR | Status: AC
Start: 2016-08-22 — End: 2016-08-22
  Administered 2016-08-22: 4 mg via INTRAVENOUS
  Filled 2016-08-22 (×2): qty 2

## 2016-08-22 MED ORDER — ISOSORBIDE MONONITRATE ER 60 MG PO TB24
60.0000 mg | ORAL_TABLET | Freq: Every day | ORAL | Status: DC
  Administered 2016-08-22 – 2016-08-24 (×3): 60 mg via ORAL
  Filled 2016-08-22 (×3): qty 1

## 2016-08-22 MED ORDER — AMLODIPINE BESYLATE 5 MG PO TABS
5.0000 mg | ORAL_TABLET | Freq: Every day | ORAL | Status: DC
  Administered 2016-08-22 – 2016-08-24 (×3): 5 mg via ORAL
  Filled 2016-08-22 (×3): qty 1

## 2016-08-22 MED ORDER — HYDROCODONE-ACETAMINOPHEN 5-325 MG PO TABS
1.0000 | ORAL_TABLET | Freq: Two times a day (BID) | ORAL | Status: DC | PRN
  Administered 2016-08-22 – 2016-08-23 (×2): 1 via ORAL
  Filled 2016-08-22 (×2): qty 1

## 2016-08-22 MED ORDER — TRAZODONE HCL 50 MG PO TABS
50.0000 mg | ORAL_TABLET | Freq: Every day | ORAL | Status: DC
  Administered 2016-08-22 – 2016-08-23 (×2): 50 mg via ORAL
  Filled 2016-08-22 (×2): qty 1

## 2016-08-22 MED ORDER — SODIUM CHLORIDE 0.9 % IV SOLN: 250.0000 mL | INTRAVENOUS | Status: DC | PRN

## 2016-08-22 MED ORDER — PANTOPRAZOLE SODIUM 40 MG PO TBEC
40.0000 mg | DELAYED_RELEASE_TABLET | Freq: Every day | ORAL | Status: DC | PRN
  Administered 2016-08-23: 40 mg via ORAL
  Filled 2016-08-22: qty 1

## 2016-08-22 MED ORDER — MECLIZINE HCL 12.5 MG PO TABS: 12.5000 mg | ORAL_TABLET | Freq: Three times a day (TID) | ORAL | Status: DC | PRN

## 2016-08-22 MED ORDER — SODIUM CHLORIDE 0.9% FLUSH: 3.0000 mL | INTRAVENOUS | Status: DC | PRN

## 2016-08-22 MED ORDER — MORPHINE SULFATE (PF) 2 MG/ML IV SOLN: 2.0000 mg | INTRAVENOUS | Status: DC | PRN

## 2016-08-22 MED ORDER — CLONAZEPAM 1 MG PO TABS
1.0000 mg | ORAL_TABLET | Freq: Two times a day (BID) | ORAL | Status: DC
  Administered 2016-08-22 – 2016-08-24 (×5): 1 mg via ORAL
  Filled 2016-08-22 (×5): qty 1

## 2016-08-22 MED ORDER — NITROGLYCERIN 0.4 MG SL SUBL
0.4000 mg | SUBLINGUAL_TABLET | SUBLINGUAL | Status: DC | PRN
  Filled 2016-08-22: qty 1

## 2016-08-22 MED ORDER — ACETAMINOPHEN 325 MG PO TABS
650.0000 mg | ORAL_TABLET | ORAL | Status: DC | PRN
  Filled 2016-08-22: qty 2

## 2016-08-22 MED ORDER — ONDANSETRON HCL 4 MG/2ML IJ SOLN: 4.0000 mg | Freq: Three times a day (TID) | INTRAMUSCULAR | Status: DC | PRN

## 2016-08-22 MED ORDER — ASPIRIN EC 325 MG PO TBEC
325.0000 mg | DELAYED_RELEASE_TABLET | Freq: Every morning | ORAL | Status: DC
  Administered 2016-08-22 – 2016-08-24 (×3): 325 mg via ORAL
  Filled 2016-08-22 (×3): qty 1

## 2016-08-22 MED ORDER — VITAMIN D 1000 UNITS PO TABS
1000.0000 [IU] | ORAL_TABLET | Freq: Every morning | ORAL | Status: DC
  Administered 2016-08-22 – 2016-08-24 (×3): 1000 [IU] via ORAL
  Filled 2016-08-22 (×3): qty 1

## 2016-08-22 MED ORDER — ALBUTEROL SULFATE (2.5 MG/3ML) 0.083% IN NEBU: 3.0000 mL | INHALATION_SOLUTION | Freq: Four times a day (QID) | RESPIRATORY_TRACT | Status: DC | PRN

## 2016-08-22 MED ORDER — FERROUS SULFATE 325 (65 FE) MG PO TABS
325.0000 mg | ORAL_TABLET | Freq: Every day | ORAL | Status: DC
  Administered 2016-08-24: 325 mg via ORAL
  Filled 2016-08-22: qty 1

## 2016-08-22 MED ORDER — ZOLPIDEM TARTRATE 5 MG PO TABS: 5.0000 mg | ORAL_TABLET | Freq: Every evening | ORAL | Status: DC | PRN

## 2016-08-22 MED ORDER — DULOXETINE HCL 60 MG PO CPEP
60.0000 mg | ORAL_CAPSULE | Freq: Every day | ORAL | Status: DC
  Administered 2016-08-22 – 2016-08-23 (×2): 60 mg via ORAL
  Filled 2016-08-22 (×3): qty 1

## 2016-08-22 MED ORDER — SODIUM CHLORIDE 0.9% FLUSH: 3.0000 mL | Freq: Two times a day (BID) | INTRAVENOUS | Status: DC

## 2016-08-22 MED ORDER — POTASSIUM CHLORIDE 10 MEQ/100ML IV SOLN
10.0000 meq | INTRAVENOUS | Status: AC
  Administered 2016-08-22 (×2): 10 meq via INTRAVENOUS
  Filled 2016-08-22 (×2): qty 100

## 2016-08-22 NOTE — Progress Notes (Signed)
CRITICAL VALUE ALERT  Critical value received:  Troponin I   Date of notification:08/22/16   Time of notification: 04:55  Critical value read back:Yes.    Nurse who received alert:  Griffin Dakin RN   MD notified (1st page):  K.Schorr  Time of first page: 05:05

## 2016-08-22 NOTE — Consult Note (Signed)
ELECTROPHYSIOLOGY CONSULT NOTE    Patient ID: [name] MRN: [id number], DOB/AGE: [id number] 62 y.o.  Admit date: 08/21/2016 Date of Consult: 08/22/2016  Primary Physician: [name], MD Primary Cardiologist: Doylene Canard Electrophysiologist: [name]  Reason for Consultation: SVT  HPI:  [name] is a 60 y.o. female with a past medical history significant for CAD, hypertension, tobacco abuse, and OSA (non compliant with CPAP).  She has had tachypalpitations for several years and had been maintained on Metoprolol until seen by her PCP earlier this year. At that time, she was noted to be bradycardic and had symptoms of fatigue and weakness and so BB was discontinued.  Since then, she has had intermittent palpitations that are occasionally associated with chest tightness. There are no clear triggers. On the day of admission, she had an episode that was more "severe" than the others she has had in the past and she called 911 for further evaluation.  On their arrival, she was in a mid-RP tachycardia at a rate of 155bpm. She was given adenosine x2 with conversion to AF briefly and then SR by the time she got to the ER.  Today, she reports ongoing weakness and fatigue.  She is not currently having chest pain or shortness of breath. She has not had dizziness, syncope, or pre-syncope.   Echo pending this admission.  Plan for [facility] tomorrow by Dr Doylene Canard.   Past Medical History:  Diagnosis Date  . Agoraphobia without history of panic disorder   . Anxiety   . Asthma   . Coronary artery disease CARDIOLOGIST- DR  Doylene Canard  (VISIT 03-30-11 W/ CHART)   NON-OBS. CAD   (STRESS TEST NOV. 2011  . DDD (degenerative disc disease)   . Depression   . DJD (degenerative joint disease)    JOINT PAIN  . Fibromyalgia   . Frequency of urination   . GERD (gastroesophageal reflux disease) AND HIATIAL HERNIA   CONTROLLED W/ NEXIUM  . Hemorrhoids   . History of gastric ulcer 2004  . Hypertension   . IBS  (irritable bowel syndrome)   . Incisional pain s/p interstim implant 1st stage--- 12-07-11    left upper buttock-- pt states dressing clean dry and intact (on 12-08-11)  . Insomnia   . Iron deficiency anemia   . Nocturia   . Non-productive cough   . Numbness in both hands AT TIMES  . OSA on CPAP   . Urge urinary incontinence      Surgical History:  Past Surgical History:  Procedure Laterality Date  . CARDIAC CATHETERIZATION  2003  . CERVICAL DISCECTOMY  2002   C4 - 7  . CERVICAL FUSION  2008   C4 - T1  . CHOLECYSTECTOMY  1982  . CYSTO/ HOD/ BLADDER BX  2006  . CYSTO/ HOD/ BLADDER BX/ FULGERATION  06-12-2011  . HYSTEROSCOPY W/D&C  2005  . INTERSTIM IMPLANT PLACEMENT  12/07/2011   Procedure: [name] IMPLANT FIRST STAGE;  Surgeon: [name], MD;  Location: [facility];  Service: Urology;  Laterality: Right;  . INTERSTIM IMPLANT PLACEMENT  12/14/2011   Procedure: [name] IMPLANT SECOND STAGE;  Surgeon: [name], MD;  Location: [facility];  Service: Urology;  Laterality: Right;  rad tech ok by vickie at main  . LAMINECTOMY  2007   L3 - 5  . RIGHT THUMB SURG.  2001  . TONSILLECTOMY AND ADENOIDECTOMY  1965  . WRIST SURGERY Left    [facility]  Prescriptions Prior to Admission  Medication Sig Dispense Refill Last Dose  . albuterol (PROVENTIL HFA;VENTOLIN HFA) 108 (90 BASE) MCG/ACT inhaler Inhale 2 puffs into the lungs every 6 (six) hours as needed for wheezing or shortness of breath.   Past Month at Unknown time  . amLODipine (NORVASC) 5 MG tablet Take 5 mg by mouth every morning.     08/21/2016 at Unknown time  . Ascorbic Acid (VITAMIN C) 1000 MG tablet Take 1,000 mg by mouth daily.   08/21/2016 at Unknown time  . aspirin EC 325 MG tablet Take 325 mg by mouth every morning.    08/21/2016 at Unknown time  . buPROPion (WELLBUTRIN XL) 150 MG 24 hr tablet Take 150 mg by mouth daily.    08/21/2016 at Unknown time  . cholecalciferol (VITAMIN D)  1000 UNITS tablet Take 1,000 Units by mouth every morning.    08/21/2016 at Unknown time  . clonazePAM (KLONOPIN) 1 MG tablet Take 1 mg by mouth 2 (two) times daily.  5 08/21/2016 at am  . DULoxetine (CYMBALTA) 60 MG capsule Take 60 mg by mouth at bedtime.    08/20/2016  . fenofibrate (TRICOR) 48 MG tablet Take 48 mg by mouth every morning.   6 08/21/2016 at Unknown time  . ferrous sulfate 325 (65 FE) MG tablet Take 325 mg by mouth daily with breakfast.   08/21/2016 at Unknown time  . HYDROcodone-acetaminophen (NORCO) 5-325 MG per tablet Take 1 tablet by mouth 2 (two) times daily as needed for pain.    Past Week at Unknown time  . ibuprofen (ADVIL,MOTRIN) 200 MG tablet Take 600 mg by mouth every 6 (six) hours as needed for pain.   unk  . isosorbide mononitrate (IMDUR) 60 MG 24 hr tablet Take 1 tablet (60 mg total) by mouth daily. 90 tablet 3 08/21/2016 at Unknown time  . meclizine (ANTIVERT) 12.5 MG tablet TAKE 1 TABLET 3 TIMES A DAY AS NEEDED FOR DIZZYNESS  0 unk  . methocarbamol (ROBAXIN) 500 MG tablet Take 1 tablet (500 mg total) by mouth every 8 (eight) hours as needed (for muscle spasms). 45 tablet 3 Past Month at Unknown time  . PRESCRIPTION MEDICATION Inner Stem therapy for overactive bladder implant on the right side.   08/22/2016 at Unknown time  . SUPER B COMPLEX/C PO Take 1 tablet by mouth every morning.   08/21/2016 at Unknown time  . traZODone (DESYREL) 50 MG tablet Take 50 mg by mouth at bedtime.     08/20/2016    Inpatient Medications:  . amLODipine  5 mg Oral Daily  . aspirin EC  325 mg Oral q morning - 10a  . B-complex with vitamin C  1 tablet Oral Daily  . buPROPion  150 mg Oral Daily  . cholecalciferol  1,000 Units Oral q morning - 10a  . clonazePAM  1 mg Oral BID  . DULoxetine  60 mg Oral QHS  . enoxaparin (LOVENOX) injection  40 mg Subcutaneous Daily  . fenofibrate  54 mg Oral Daily  . ferrous sulfate  325 mg Oral Q breakfast  . isosorbide mononitrate  60 mg Oral Daily  . nicotine  21  mg Transdermal Daily  . sodium chloride flush  3 mL Intravenous Q12H  . traZODone  50 mg Oral QHS  . vitamin C  1,000 mg Oral Daily    Allergies:  Allergies  Allergen Reactions  . Statins Other (See Comments)    Causes muscle weakness and joint pain  .  Ace Inhibitors Other (See Comments)    DECREASES HEARTRATE  . Amoxicillin Diarrhea  . Midazolam Hcl     HYPER  . Nsaids     AVOIDS HX GASTRIC ULCER  . Prednisone Other (See Comments)    HYPER  . Sulfa Antibiotics Rash and Other (See Comments)    JOINT PAIN    Social History   Social History  . Marital status: Widowed    Spouse name: N/A  . Number of children: N/A  . Years of education: N/A   Occupational History  . Not on file.   Social History Main Topics  . Smoking status: Current Every Day Smoker    Packs/day: 2.00    Years: 30.00  . Smokeless tobacco: Never Used  . Alcohol use No     Comment: OCCASIONAL  . Drug use: No  . Sexual activity: Not on file   Other Topics Concern  . Not on file   Social History Narrative   Lives with daughter, granddaughter and daughter's friend in a one story home.    On disability since 2009.  Previously worked as a Scientist, research (medical).    Education: high school     Family History  Problem Relation Age of Onset  . Hypertension Father   . Heart disease Father   . Kidney failure Father   . Breast cancer Mother   . Hypertension Mother   . Coronary artery disease Mother   . Alzheimer's disease Mother   . Hypertension Brother   . Hypertension Sister   . Hypertension Daughter      Review of Systems: All other systems reviewed and are otherwise negative except as noted above.  Physical Exam: Vitals:   08/22/16 0345 08/22/16 0411 08/22/16 1022 08/22/16 1332  BP: 144/78 (!) 168/68 (!) 146/82 137/70  Pulse: 64 70 66 72  Resp: 22 18 18 18   Temp:  98 F (36.7 C) 97.8 F (36.6 C) 98.7 F (37.1 C)  TempSrc:  Oral Oral Oral  SpO2: 97% 96% 100% 100%  Weight:   146 lb 11.2 oz (66.5 kg)    Height:  4' 11.5" (1.511 m)      GEN- The patient is elderly and chronically ill appearing, alert and oriented x 3 today.   HEENT: normocephalic, atraumatic; sclera clear, conjunctiva pink; hearing intact; oropharynx clear; neck supple  Lungs- Clear to ausculation bilaterally, normal work of breathing.  No wheezes, rales, rhonchi Heart- Regular rate and rhythm, no murmurs, rubs or gallops  GI- soft, non-tender, non-distended, bowel sounds present  Extremities- no clubbing, cyanosis, or edema; DP/PT/radial pulses 2+ bilaterally MS- no significant deformity or atrophy Skin- warm and dry, no rash or lesion Psych- euthymic mood, full affect Neuro- strength and sensation are intact  Labs:   Lab Results  Component Value Date   WBC 14.7 (H) 08/22/2016   HGB 13.2 08/22/2016   HCT 39.7 08/22/2016   MCV 90.4 08/22/2016   PLT 261 08/22/2016    Recent Labs Lab 08/21/16 2325  NA 138  K 2.8*  CL 107  CO2 25  BUN 9  CREATININE 0.98  CALCIUM 9.5  GLUCOSE 123*      Radiology/Studies: Dg Chest 2 View Result Date: 08/21/2016 CLINICAL DATA:  Tachycardia and shortness of breath EXAM: CHEST  2 VIEW COMPARISON:  September 03, 2014 FINDINGS: The heart size and mediastinal contours are within normal limits. Both lungs are clear. The visualized skeletal structures are unremarkable. IMPRESSION: No active cardiopulmonary disease.  Electronically Signed   By: Dorise Bullion III M.D   On: 08/21/2016 23:42    [id number] rhythm, rate 68, poor R wave progression   TELEMETRY: sinus rhythm  Assessment/Plan: 1.  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 She was previously on BB but this was discontinued 2/2 bradycardia (heart rates 40-50) and fatigue.  She has had increasing palpitations off medical therapy.   Options are to retry medical therapy with CCB or alternate BB or pursue EPS/ablation.  I did discuss with her that ablation  success was dependent on ability to induce and map tachycardia.  Dr Rayann Heman discussed with patient, will plan to try low dose BB and see how she tolerates while admitted.   2.  Chest pain/flat troponin trend Plan for [facility] tomorrow with Dr Doylene Canard Continue medical therapy  3.  OSA Non complaint with CPAP Relationship between OSA and atrial arrhythmias discussed with patient today I have encouraged CPAP compliance  4.  Fatigue The patient has persistent fatigue and weakness similar to when she was on BB therapy this admission in the setting of sinus rhythm and normal HR I am unsure how much her bradycardia was contributing to her symptoms For this reason, I think we could retry medical thearpy with AVN blocking agents  5.  Paroxysmal atrial fibrillation Seen on EMS strips post adenosine administration Would follow for recurrence CHADS2VASC is 3 and she would require [facility] if she has documented recurrent AF  6.  HTN Stable No change required today  7.  Tobacco abuse Cessation recommended    Signed, Chanetta Marshall, NP 08/22/2016 4:36 PM  I have seen, examined the patient, and reviewed the above assessment and plan.  On exam, RRR. Changes to above are made where necessary.  She has mid RP SVT which appears to be her clinical arrhythmia.  Adenosine induced AF noted in the setting of adenosine administration by EMS.  Options include restarting metoprolol vs EPS/RFA. For now, will resume metoprolol and follow.  She has cath planned for tomorrow.  Dr [name] has seen her recently.  I will defer further workup and possible elective return for EPS with him.  Co Sign: Thompson Grayer, MD 08/22/2016 6:04 PM

## 2016-08-22 NOTE — Care Management Obs Status (Signed)
[place] NOTIFICATION   Patient Details  Name: [name] MRN: [id number] Date of Birth: Feb 07, 1956   Medicare Observation Status Notification Given:  Yes    Dawayne Patricia, RN 08/22/2016, 4:51 PM

## 2016-08-22 NOTE — Progress Notes (Signed)
Patient seems anxious about CPAP use. Stated that she will call when she is ready to put it on. RT will continue to monitor as needed.

## 2016-08-22 NOTE — Consult Note (Signed)
Reason for Consult:  SVT chest pain  Referring Physician: Dr.  Hartford Poli   PCP:  Brenda Bellows, MD  Primary Cardiologist:  Dr. Marta Meadows [name] is an 60 y.o. female.    Chief Complaint: pt called EMS for rapid HR and not feeling well.  + SVT 150-160 in SVT.     HPI:   60 YO female with CAD with cath in 2003 by Dr. Doylene Meadows mild CAD LAD 30-40% , OM2 80% but vessel was < than 1.5 mm in diameter.  EF 50-55% .   Medica therapy recommended.  Normal nuc study in 2014.   Also hx of minor carotid atherosclerosis 09/2015, HTN, and hyperlipidemia.   Other hx as below.    Recently feeling tired and her HR and BP were low so her BB was stopped.  Her energy improved then.   She had had occ episode of chest pain in April she was placed on Imdur and then the dose was increased.   For her lipids she is intolerant to statins and she was referred to lipid clinic, but I do not see that she has been.   Yesterday she developed rapid HR and she felt jittery, she was instructed to call EMS.  They found her to be in SVT rate 150-160,  She was given adenosine 6 mg-12 mg and 12 mg without improvement. Also 20 mg of dilt with decrease of HR to 120-130.   She did admit to an episode the week prior and took a muscle relaxer and symptoms resolved also with pressure up into jaw at that time.   On arrival to ER yesterday she was in [place] without chest pain.  She did develop chest pain around at 1 am, she was given ASA, SL NTG no acute EKG changes.  She also developed nausea.    Troponin 0.00--> 0.05--> 0.04.  K+ on admit was 2.8.  TSH 2.935  CXR with clear lungs.   EKG SR with old Q waves in ant leads but no acute changes.     Past Medical History:  Diagnosis Date  . Agoraphobia without history of panic disorder   . Anxiety   . Asthma   . Coronary artery disease CARDIOLOGIST- DR  [name]  (VISIT 03-30-11 W/ CHART)   NON-OBS. CAD   (STRESS TEST NOV. 2011  . DDD (degenerative disc disease)   .  Depression   . DJD (degenerative joint disease)    JOINT PAIN  . Fibromyalgia   . Frequency of urination   . GERD (gastroesophageal reflux disease) AND HIATIAL HERNIA   CONTROLLED W/ NEXIUM  . Hemorrhoids   . History of gastric ulcer 2004  . Hypertension   . IBS (irritable bowel syndrome)   . Incisional pain s/p interstim implant 1st stage--- 12-07-11    left upper buttock-- pt states dressing clean dry and intact (on 12-08-11)  . Insomnia   . Iron deficiency anemia   . Nocturia   . Non-productive cough   . Numbness in both hands AT TIMES  . OSA on CPAP   . Urge urinary incontinence     Past Surgical History:  Procedure Laterality Date  . CARDIAC CATHETERIZATION  2003  . CERVICAL DISCECTOMY  2002   C4 - 7  . CERVICAL FUSION  2008   C4 - T1  . CHOLECYSTECTOMY  1982  . CYSTO/ HOD/ BLADDER BX  2006  . CYSTO/ HOD/ BLADDER BX/ FULGERATION  06-12-2011  . HYSTEROSCOPY W/D&C  2005  . INTERSTIM IMPLANT PLACEMENT  12/07/2011   Procedure: Barrie Lyme IMPLANT FIRST STAGE;  Surgeon: Brenda Packer, MD;  Location: [facility];  Service: Urology;  Laterality: Right;  . INTERSTIM IMPLANT PLACEMENT  12/14/2011   Procedure: Barrie Lyme IMPLANT SECOND STAGE;  Surgeon: Brenda Packer, MD;  Location: [facility];  Service: Urology;  Laterality: Right;  rad tech ok by vickie at [facility]  . LAMINECTOMY  2007   L3 - 5  . RIGHT THUMB SURG.  2001  . TONSILLECTOMY AND ADENOIDECTOMY  1965  . WRIST SURGERY Left    [facility]    Family History  Problem Relation Age of Onset  . Hypertension Father   . Heart disease Father   . Kidney failure Father   . Breast cancer Mother   . Hypertension Mother   . Coronary artery disease Mother   . Alzheimer's disease Mother   . Hypertension Brother   . Hypertension Sister   . Hypertension Daughter    Social History:  reports that she has been smoking.  She has a 60.00 pack-year smoking history. She has never used smokeless  tobacco. She reports that she does not drink alcohol or use drugs.  Allergies:  Allergies  Allergen Reactions  . Statins Other (See Comments)    Causes muscle weakness and joint pain  . Ace Inhibitors Other (See Comments)    DECREASES HEARTRATE  . Amoxicillin Diarrhea  . Midazolam Hcl     HYPER  . Nsaids     AVOIDS HX GASTRIC ULCER  . Prednisone Other (See Comments)    HYPER  . Sulfa Antibiotics Rash and Other (See Comments)    JOINT PAIN    OUTPATIENT MEDICATIONS: No current facility-administered medications on file prior to encounter.    Current Outpatient Prescriptions on File Prior to Encounter  Medication Sig Dispense Refill  . albuterol (PROVENTIL HFA;VENTOLIN HFA) 108 (90 BASE) MCG/ACT inhaler Inhale 2 puffs into the lungs every 6 (six) hours as needed for wheezing or shortness of breath.    Marland Kitchen amLODipine (NORVASC) 5 MG tablet Take 5 mg by mouth every morning.      . Ascorbic Acid (VITAMIN C) 1000 MG tablet Take 1,000 mg by mouth daily.    Marland Kitchen aspirin EC 325 MG tablet Take 325 mg by mouth every morning.     Marland Kitchen buPROPion (WELLBUTRIN XL) 150 MG 24 hr tablet Take 150 mg by mouth daily.     . cholecalciferol (VITAMIN D) 1000 UNITS tablet Take 1,000 Units by mouth every morning.     . clonazePAM (KLONOPIN) 1 MG tablet Take 1 mg by mouth 2 (two) times daily.  5  . DULoxetine (CYMBALTA) 60 MG capsule Take 60 mg by mouth at bedtime.     . fenofibrate (TRICOR) 48 MG tablet Take 48 mg by mouth every morning.   6  . ferrous sulfate 325 (65 FE) MG tablet Take 325 mg by mouth daily with breakfast.    . HYDROcodone-acetaminophen (NORCO) 5-325 MG per tablet Take 1 tablet by mouth 2 (two) times daily as needed for pain.     Marland Kitchen ibuprofen (ADVIL,MOTRIN) 200 MG tablet Take 600 mg by mouth every 6 (six) hours as needed for pain.    . isosorbide mononitrate (IMDUR) 60 MG 24 hr tablet Take 1 tablet (60 mg total) by mouth daily. 90 tablet 3  . meclizine (ANTIVERT) 12.5 MG tablet TAKE 1 TABLET 3  TIMES  A DAY AS NEEDED FOR DIZZYNESS  0  . methocarbamol (ROBAXIN) 500 MG tablet Take 1 tablet (500 mg total) by mouth every 8 (eight) hours as needed (for muscle spasms). 45 tablet 3  . PRESCRIPTION MEDICATION Inner Stem therapy for overactive bladder implant on the right side.    . traZODone (DESYREL) 50 MG tablet Take 50 mg by mouth at bedtime.       CURRENT MEDICATIONS: Scheduled Meds: . amLODipine  5 mg Oral Daily  . aspirin EC  325 mg Oral q morning - 10a  . B-complex with vitamin C  1 tablet Oral Daily  . buPROPion  150 mg Oral Daily  . cholecalciferol  1,000 Units Oral q morning - 10a  . clonazePAM  1 mg Oral BID  . DULoxetine  60 mg Oral QHS  . enoxaparin (LOVENOX) injection  40 mg Subcutaneous Daily  . fenofibrate  54 mg Oral Daily  . ferrous sulfate  325 mg Oral Q breakfast  . isosorbide mononitrate  60 mg Oral Daily  . nicotine  21 mg Transdermal Daily  . traZODone  50 mg Oral QHS  . vitamin C  1,000 mg Oral Daily   Continuous Infusions: . sodium chloride 75 mL/hr at 08/22/16 0421   PRN Meds:.acetaminophen, albuterol, HYDROcodone-acetaminophen, meclizine, methocarbamol, morphine injection, nitroGLYCERIN, ondansetron (ZOFRAN) IV, pantoprazole, zolpidem   Results for orders placed or performed during the hospital encounter of 08/21/16 (from the past 48 hour(s))  Basic metabolic panel     Status: Abnormal   Collection Time: 08/21/16 11:25 PM  Result Value Ref Range   Sodium 138 135 - 145 mmol/L   Potassium 2.8 (L) 3.5 - 5.1 mmol/L   Chloride 107 101 - 111 mmol/L   CO2 25 22 - 32 mmol/L   Glucose, Bld 123 (H) 65 - 99 mg/dL   BUN 9 6 - 20 mg/dL   Creatinine, Ser 0.98 0.44 - 1.00 mg/dL   Calcium 9.5 8.9 - 10.3 mg/dL   GFR calc non Af Amer >60 >60 mL/min   GFR calc Af Amer >60 >60 mL/min    Comment: (NOTE) The eGFR has been calculated using the CKD EPI equation. This calculation has not been validated in all clinical situations. eGFR's persistently <60 mL/min signify  possible Chronic Kidney Disease.    Anion gap 6 5 - 15  CBC     Status: Abnormal   Collection Time: 08/21/16 11:25 PM  Result Value Ref Range   WBC 15.4 (H) 4.0 - 10.5 K/uL   RBC 4.98 3.87 - 5.11 MIL/uL   Hemoglobin 15.2 (H) 12.0 - 15.0 g/dL   HCT 45.4 36.0 - 46.0 %   MCV 91.2 78.0 - 100.0 fL   MCH 30.5 26.0 - 34.0 pg   MCHC 33.5 30.0 - 36.0 g/dL   RDW 12.7 11.5 - 15.5 %   Platelets 296 150 - 400 K/uL  I-stat troponin, ED     Status: None   Collection Time: 08/21/16 11:33 PM  Result Value Ref Range   Troponin i, poc 0.00 0.00 - 0.08 ng/mL   Comment 3            Comment: Due to the release kinetics of cTnI, a negative result within the first hours of the onset of symptoms does not rule out myocardial infarction with certainty. If myocardial infarction is still suspected, repeat the test at appropriate intervals.   Magnesium     Status: None   Collection Time: 08/22/16  1:58  AM  Result Value Ref Range   Magnesium 2.0 1.7 - 2.4 mg/dL  Lipid panel     Status: Abnormal   Collection Time: 08/22/16  3:51 AM  Result Value Ref Range   Cholesterol 160 0 - 200 mg/dL   Triglycerides 131 <150 mg/dL   HDL 34 (L) >40 mg/dL   Total CHOL/HDL Ratio 4.7 RATIO   VLDL 26 0 - 40 mg/dL   LDL Cholesterol 100 (H) 0 - 99 mg/dL    Comment:        Total Cholesterol/HDL:CHD Risk Coronary Heart Disease Risk Table                     Men   Women  1/2 Average Risk   3.4   3.3  Average Risk       5.0   4.4  2 X Average Risk   9.6   7.1  3 X Average Risk  23.4   11.0        Use the calculated Patient Ratio above and the CHD Risk Table to determine the patient's CHD Risk.        ATP III CLASSIFICATION (LDL):  <100     mg/dL   Optimal  100-129  mg/dL   Near or Above                    Optimal  130-159  mg/dL   Borderline  160-189  mg/dL   High  >190     mg/dL   Very High   Troponin I (q 6hr x 3)     Status: Abnormal   Collection Time: 08/22/16  3:51 AM  Result Value Ref Range   Troponin  I 0.05 (HH) <0.03 ng/mL    Comment: CRITICAL RESULT CALLED TO, READ BACK BY AND VERIFIED WITH: STRADLIN S,RN 08/22/16 0441 WAYK   TSH     Status: None   Collection Time: 08/22/16  3:51 AM  Result Value Ref Range   TSH 2.935 0.350 - 4.500 uIU/mL  CBC with Differential/Platelet     Status: Abnormal   Collection Time: 08/22/16  3:51 AM  Result Value Ref Range   WBC 14.7 (H) 4.0 - 10.5 K/uL   RBC 4.39 3.87 - 5.11 MIL/uL   Hemoglobin 13.2 12.0 - 15.0 g/dL   HCT 39.7 36.0 - 46.0 %   MCV 90.4 78.0 - 100.0 fL   MCH 30.1 26.0 - 34.0 pg   MCHC 33.2 30.0 - 36.0 g/dL   RDW 12.7 11.5 - 15.5 %   Platelets 261 150 - 400 K/uL   Neutrophils Relative % 71 %   Lymphocytes Relative 20 %   Monocytes Relative 6 %   Eosinophils Relative 3 %   Basophils Relative 0 %   Neutro Abs 10.5 (H) 1.7 - 7.7 K/uL   Lymphs Abs 2.9 0.7 - 4.0 K/uL   Monocytes Absolute 0.9 0.1 - 1.0 K/uL   Eosinophils Absolute 0.4 0.0 - 0.7 K/uL   Basophils Absolute 0.0 0.0 - 0.1 K/uL   WBC Morphology ATYPICAL LYMPHOCYTES   Protime-INR     Status: None   Collection Time: 08/22/16  3:51 AM  Result Value Ref Range   Prothrombin Time 13.4 11.4 - 15.2 seconds   INR 1.02   Urine rapid drug screen (hosp performed)     Status: Abnormal   Collection Time: 08/22/16  8:55 AM  Result Value Ref Range   Opiates POSITIVE (A) NONE  DETECTED   Cocaine NONE DETECTED NONE DETECTED   Benzodiazepines NONE DETECTED NONE DETECTED   Amphetamines NONE DETECTED NONE DETECTED   Tetrahydrocannabinol NONE DETECTED NONE DETECTED   Barbiturates NONE DETECTED NONE DETECTED    Comment:        DRUG SCREEN FOR MEDICAL PURPOSES ONLY.  IF CONFIRMATION IS NEEDED FOR ANY PURPOSE, NOTIFY LAB WITHIN 5 DAYS.        LOWEST DETECTABLE LIMITS FOR URINE DRUG SCREEN Drug Class       Cutoff (ng/mL) Amphetamine      1000 Barbiturate      200 Benzodiazepine   726 Tricyclics       203 Opiates          300 Cocaine          300 THC              50   Troponin I  (q 6hr x 3)     Status: Abnormal   Collection Time: 08/22/16  9:09 AM  Result Value Ref Range   Troponin I 0.04 (HH) <0.03 ng/mL    Comment: CRITICAL VALUE NOTED.  VALUE IS CONSISTENT WITH PREVIOUSLY REPORTED AND CALLED VALUE.   Dg Chest 2 View  Result Date: 08/21/2016 CLINICAL DATA:  Tachycardia and shortness of breath EXAM: CHEST  2 VIEW COMPARISON:  September 03, 2014 FINDINGS: The heart size and mediastinal contours are within normal limits. Both lungs are clear. The visualized skeletal structures are unremarkable. IMPRESSION: No active cardiopulmonary disease. Electronically Signed   By: Dorise Bullion III M.D   On: 08/21/2016 23:42    ROS: General:no colds or fevers, no weight changes Skin:no rashes or ulcers HEENT:no blurred vision, no congestion CV:see HPI PUL:see HPI GI:no diarrhea constipation or melena, no indigestion GU:no hematuria, no dysuria MS:no joint pain, no claudication Neuro:no syncope, no lightheadedness Endo:no diabetes, no thyroid disease  Blood pressure (!) 146/82, pulse 66, temperature 97.8 F (36.6 C), temperature source Oral, resp. rate 18, height 4' 11.5" (1.511 m), weight 146 lb 11.2 oz (66.5 kg), last menstrual period 09/06/2005, SpO2 100 %.  Wt Readings from Last 3 Encounters:  08/22/16 146 lb 11.2 oz (66.5 kg)  04/12/16 146 lb (66.2 kg)  02/18/16 145 lb 9 oz (66 kg)    PE: General: Pleasant affect, NAD Skin:Warm and dry, brisk capillary refill HEENT:normocephalic, sclera clear, mucus membranes moist Neck:supple, no JVD, no bruits  Heart:S1S2 RRR without murmur, gallup, rub or click Lungs:clear without rales, rhonchi, occ wheezes TDH:RCBU, non tender, + BS, do not palpate liver spleen or masses Ext:no lower ext edema, 2+ pedal pulses, 2+ radial pulses Neuro:alert and oriented X 3, MAE, follows commands, + facial symmetry    Assessment/Plan Principal Problem:   Chest pain Active Problems:   Leukocytosis   Tobacco abuse   Hypertension    GERD (gastroesophageal reflux disease)   Depression   Coronary artery disease   Anxiety   SVT (supraventricular tachycardia) (HCC)   Hypokalemia  SVT-  Tachy brady syndrome.  Off BB, do not have strips of SVT.  This is second episode of prolonged tach.   Will ask EP to see   Chest pain with mild increase of troponin which could be due to tachycardia. Last nuc in 2014  Was neg for ischemia though she has had chest pain and imdur has helped.  Pt stated Dr. Doylene Meadows will cath tomorrow   Tobacco use, on nicoderm here but she realizes she should stop smoking.  HTN -elevated at times.   Hypokalemia-has been replaced.   Cecilie Kicks  Nurse Practitioner Certified [place] Pager [phone] or after 5pm or weekends call [phone] 08/22/2016, 1:28 PM

## 2016-08-22 NOTE — ED Provider Notes (Signed)
[place] DEPT Provider Note   CSN: [id number] Arrival date & time: 08/21/16  2311     History   Chief Complaint Chief Complaint  Patient presents with  . Tachycardia    HPI [name] is a 60 y.o. female.  HPI   Patient is a 60 year old female, current smoker, with history of CAD, HTN, OSA, asthma, anxiety, leukocytosis, she presents emergency department via EMS complaining of feeling jittery with tachycardia and hypertension.  Her symptoms began yesterday at roughly 6:30 PM and persisted until 9:30 PM. She monitored her heart rate and blood pressure and called her doctor's office, they encouraged her to present to the emergency department if her heart rate did not return to normal.  Patient denied any chest pain, shortness of breath, or near syncope at this time.  She did feel anxious and jittery but states that this is normally how she feels when she is having cardiac issues.  Per EMS report, patient heart rate was 150-160 in SVT, and she was given 6, 12 and 12 mg of adenosine w/o conversion or improvement.  20 mg cardizen was given which decreased HR to 120-130.  Initial ER vitals record HR 85 upon arrival.  The patient currently is asymptomatic. She states she had a similar episode one week ago and resolved with taking muscle relaxers.  She reports feeling chest pain earlier today but cannot state the time of duration. Last week she had an episode of chest tightness which radiated up her neck and jaw felt like a pressure and squeezing, also resolved spontaneously.  Patient denies any recent palpitations, near syncope, lower extremity edema, orthopnea, PND.  She drank a Pepsi earlier today but denies any other caffeine use or any illicit or illegal substance abuse.  She denies any shortness of breath, cough, wheeze, abdominal pain, nausea, vomiting, fever, chills, sweats.  She is established with Dr. Curt Bears of [facility].       Past Medical History:  Diagnosis Date  . Agoraphobia  without history of panic disorder   . Anxiety   . Asthma   . Coronary artery disease CARDIOLOGIST- DR  Doylene Canard  (VISIT 03-30-11 W/ CHART)   NON-OBS. CAD   (STRESS TEST NOV. 2011  . DDD (degenerative disc disease)   . Depression   . DJD (degenerative joint disease)    JOINT PAIN  . Fibromyalgia   . Frequency of urination   . GERD (gastroesophageal reflux disease) AND HIATIAL HERNIA   CONTROLLED W/ NEXIUM  . Hemorrhoids   . History of gastric ulcer 2004  . Hypertension   . IBS (irritable bowel syndrome)   . Incisional pain s/p interstim implant 1st stage--- 12-07-11    left upper buttock-- pt states dressing clean dry and intact (on 12-08-11)  . Insomnia   . Iron deficiency anemia   . Nocturia   . Non-productive cough   . Numbness in both hands AT TIMES  . OSA on CPAP   . Urge urinary incontinence     Patient Active Problem List   Diagnosis Date Noted  . Carpal tunnel syndrome, bilateral 02/18/2016  . Leukocytosis 05/26/2014  . Tobacco abuse 05/26/2014  . Urge incontinence 12/14/2011    Past Surgical History:  Procedure Laterality Date  . CARDIAC CATHETERIZATION  2003  . CERVICAL DISCECTOMY  2002   C4 - 7  . CERVICAL FUSION  2008   C4 - T1  . CHOLECYSTECTOMY  1982  . CYSTO/ HOD/ BLADDER BX  2006  . CYSTO/  HOD/ BLADDER BX/ FULGERATION  06-12-2011  . HYSTEROSCOPY W/D&C  2005  . INTERSTIM IMPLANT PLACEMENT  12/07/2011   Procedure: Barrie Lyme IMPLANT FIRST STAGE;  Surgeon: [name], MD;  Location[facility];  Service: Urology;  Laterality: Right;  . INTERSTIM IMPLANT PLACEMENT  12/14/2011   Procedure: Barrie Lyme IMPLANT SECOND STAGE;  Surgeon: [name], MD;  Location: [facility];  Service: Urology;  Laterality: Right;  rad tech ok by vickie at [facility]  . LAMINECTOMY  2007   L3 - 5  . RIGHT THUMB SURG.  2001  . TONSILLECTOMY AND ADENOIDECTOMY  1965  . WRIST SURGERY Left    [facility]    OB History    No data available         Home Medications    Prior to Admission medications   Medication Sig Start Date End Date Taking? Authorizing Provider  albuterol (PROVENTIL HFA;VENTOLIN HFA) 108 (90 BASE) MCG/ACT inhaler Inhale 2 puffs into the lungs every 6 (six) hours as needed for wheezing or shortness of breath.   Yes Historical Provider, MD  amLODipine (NORVASC) 5 MG tablet Take 5 mg by mouth every morning.     Yes Historical Provider, MD  Ascorbic Acid (VITAMIN C) 1000 MG tablet Take 1,000 mg by mouth daily.   Yes Historical Provider, MD  aspirin EC 325 MG tablet Take 325 mg by mouth every morning.    Yes Historical Provider, MD  buPROPion (WELLBUTRIN XL) 150 MG 24 hr tablet Take 150 mg by mouth daily.    Yes Historical Provider, MD  cholecalciferol (VITAMIN D) 1000 UNITS tablet Take 1,000 Units by mouth every morning.    Yes Historical Provider, MD  clonazePAM (KLONOPIN) 1 MG tablet Take 1 mg by mouth 2 (two) times daily. 05/10/15  Yes Historical Provider, MD  DULoxetine (CYMBALTA) 60 MG capsule Take 60 mg by mouth at bedtime.    Yes Historical Provider, MD  fenofibrate (TRICOR) 48 MG tablet Take 48 mg by mouth every morning.  04/30/15  Yes Historical Provider, MD  ferrous sulfate 325 (65 FE) MG tablet Take 325 mg by mouth daily with breakfast.   Yes Historical Provider, MD  HYDROcodone-acetaminophen (NORCO) 5-325 MG per tablet Take 1 tablet by mouth 2 (two) times daily as needed for pain.    Yes Historical Provider, MD  ibuprofen (ADVIL,MOTRIN) 200 MG tablet Take 600 mg by mouth every 6 (six) hours as needed for pain.   Yes Historical Provider, MD  isosorbide mononitrate (IMDUR) 60 MG 24 hr tablet Take 1 tablet (60 mg total) by mouth daily. 04/12/16  Yes Will Meredith Leeds, MD  meclizine (ANTIVERT) 12.5 MG tablet TAKE 1 TABLET 3 TIMES A DAY AS NEEDED FOR DIZZYNESS 01/12/16  Yes Historical Provider, MD  methocarbamol (ROBAXIN) 500 MG tablet Take 1 tablet (500 mg total) by mouth every 8 (eight) hours as needed (for  muscle spasms). 02/18/16  Yes Alda Berthold, DO  [place] Stem therapy for overactive bladder implant on the right side.   Yes Historical Provider, MD  SUPER B COMPLEX/C PO Take 1 tablet by mouth every morning.   Yes Historical Provider, MD  traZODone (DESYREL) 50 MG tablet Take 50 mg by mouth at bedtime.     Yes Historical Provider, MD    Family History Family History  Problem Relation Age of Onset  . Hypertension Father   . Heart disease Father   . Kidney failure Father   . Breast  cancer Mother   . Hypertension Mother   . Coronary artery disease Mother   . Alzheimer's disease Mother   . Hypertension Brother   . Hypertension Sister   . Hypertension Daughter     Social History Social History  Substance Use Topics  . Smoking status: Current Every Day Smoker    Packs/day: 2.00    Years: 30.00  . Smokeless tobacco: Never Used  . Alcohol use No     Comment: OCCASIONAL     Allergies   Statins; Ace inhibitors; Amoxicillin; Midazolam hcl; Nsaids; Prednisone; and Sulfa antibiotics   Review of Systems Review of Systems  All other systems reviewed and are negative.    Physical Exam Updated Vital Signs BP 132/73   Pulse 66   Temp 98.1 F (36.7 C)   Resp 22   Ht 4\' 11"  (1.499 m)   Wt 68 kg   LMP 09/06/2005   SpO2 94%   BMI 30.30 kg/m   Physical Exam  Constitutional: She is oriented to person, place, and time. She appears well-developed and well-nourished. No distress.  HENT:  Head: Normocephalic and atraumatic.  Nose: Nose normal.  Mouth/Throat: Oropharynx is clear and moist. No oropharyngeal exudate.  Eyes: Conjunctivae and EOM are normal. Pupils are equal, round, and reactive to light. Right eye exhibits no discharge. Left eye exhibits no discharge. No scleral icterus.  Neck: Normal range of motion. Neck supple. No JVD present. No tracheal deviation present. No thyromegaly present.  Cardiovascular: Normal rate, regular rhythm, normal heart  sounds and intact distal pulses.  Exam reveals no gallop and no friction rub.   No murmur heard. No lower extremity edema, symmetrical radial pulse 2+, DP pulses 2+  Pulmonary/Chest: Effort normal and breath sounds normal. No stridor. No respiratory distress. She has no wheezes. She has no rales. She exhibits no tenderness.  Abdominal: Soft. Bowel sounds are normal. She exhibits no distension and no mass. There is no tenderness. There is no rebound and no guarding.  Musculoskeletal: Normal range of motion. She exhibits no edema or tenderness.  Lymphadenopathy:    She has no cervical adenopathy.  Neurological: She is alert and oriented to person, place, and time. She exhibits normal muscle tone. Coordination normal.  Skin: Skin is warm and dry. Capillary refill takes less than 2 seconds. No rash noted. She is not diaphoretic. No erythema. No pallor.  Psychiatric: She has a normal mood and affect. Her behavior is normal. Judgment and thought content normal.  Nursing note and vitals reviewed.    ED Treatments / Results  Labs (all labs ordered are listed, but only abnormal results are displayed) Labs Reviewed  BASIC METABOLIC PANEL - Abnormal; Notable for the following:       Result Value   Potassium 2.8 (*)    Glucose, Bld 123 (*)    All other components within normal limits  CBC - Abnormal; Notable for the following:    WBC 15.4 (*)    Hemoglobin 15.2 (*)    All other components within normal limits  MAGNESIUM  I-STAT TROPOININ, ED    EKG  EKG Interpretation  Date/Time:  Tuesday August 22 2016 00:54:51 EDT Ventricular Rate:  63 PR Interval:    QRS Duration: 98 QT Interval:  452 QTC Calculation: 463 R Axis:   87 Text Interpretation:  Sinus rhythm Anterior infarct, old Nonspecific T abnormalities, lateral leads No significant change since last tracing Confirmed by WARD,  DO, KRISTEN [id number]) on 08/22/2016 1:05:39 AM  Radiology Dg Chest 2 View  Result Date:  08/21/2016 CLINICAL DATA:  Tachycardia and shortness of breath EXAM: CHEST  2 VIEW COMPARISON:  September 03, 2014 FINDINGS: The heart size and mediastinal contours are within normal limits. Both lungs are clear. The visualized skeletal structures are unremarkable. IMPRESSION: No active cardiopulmonary disease. Electronically Signed   By: Dorise Bullion III M.D   On: 08/21/2016 23:42    Procedures Procedures (including critical care time)  Medications Ordered in ED Medications  nitroGLYCERIN (NITROSTAT) SL tablet 0.4 mg (not administered)  0.45 % NaCl with KCl 20 mEq / L infusion ( Intravenous New Bag/Given 08/22/16 0152)  morphine 2 MG/ML injection 2 mg (not administered)  ondansetron (ZOFRAN) injection 4 mg (0 mg Intravenous Hold 08/22/16 0156)  aspirin chewable tablet 324 mg (324 mg Oral Given 08/22/16 0149)     Initial Impression / Assessment and Plan / ED Course  I have reviewed the triage vital signs and the nursing notes.  Pertinent labs & imaging results that were available during my care of the patient were reviewed by me and considered in my medical decision making (see chart for details).  Clinical Course   Pt presents with three hour episode of "jittery" with tachycardia, she called cardiology, they told her to come to the ER if HR does not improve.  Per EMS, pt with tachycardia/reported SVT which converted after 05-30-11 adenosine and then 20 mg cardizem. No strips available.  Pt at presentation is NSR, no CP, SOB. Cardiac work up initiated, worked to find any cardiac monitoring from EMS, but none available.    2003 CAD Last stress 2013 HTN - IMDUR, could not tolerate BB, allergic to statins and ACEI   In the ED @ ~1am she had substernal chest pressure, EKG obtained, non-specific T-waves, no ischemic changes, given ASA, SL NTG, pain meds PRN  Will need admission for CP r/o - tele obs - per Dr. Leonides Schanz Initial troponin negative.  Pt hypokalemic 2.8, replaced with IVF. Dr. Blaine Hamper to  admit    Final Clinical Impressions(s) / ED Diagnoses   Final diagnoses:  Tachycardia  Hypokalemia  Chest pain, unspecified chest pain type    New Prescriptions New Prescriptions   No medications on file     Delsa Grana, PA-C 08/22/16 [place], DO 08/22/16 [id number]

## 2016-08-22 NOTE — Progress Notes (Signed)
Placed patient on CPAP for the night without complications. RT will continue to monitor as needed.

## 2016-08-22 NOTE — H&P (Signed)
History and Physical    [name] [id number] DOB: 25-Jun-1956 DOA: 08/21/2016  Referring MD/NP/PA:   PCP: [name], MD   Patient coming from:  The patient is coming from home.  At baseline, pt is independent for most of ADL.  Chief Complaint: Palpitation and chest pain  HPI: [name] is a 60 y.o. female with medical history significant of CAD, hypertension, asthma, tobacco abuse, GERD, depression, anxiety, fibromyalgia, gastric ulcer, IBS, OSA on CPAP, chronic interstitial cystitis, chronic leukocytosis, who presents with palpitation and chest pain.   Patient reports that she had one episode of chest pain, which happened at about 2:30 PM. The chest pain was located in the substernal area, 4 out of 10 in severity, pressure-like pain, nonradiating. It lasted for about 10 minutes, then resolved spontaneously. It was not aggravated or alleviated by any known factors. Patient did not have shortness of breath, fever or chills. Patient states that she started having palpitations and heart racing at about 9:30 PM. Per EMS report, patient had SVT with heart rates of 150-160.  EMS gave 6mg , 12mg , and 12mg  of adenosine without improvement. Pt also given 20mg  cardizem with decrease in pulse to 120-130. At arrival to ED, her HR is at 60s. Pt reports similar episode last week. She took a muscle relaxer and symptoms resolved.   Patient has mild nausea, but no vomiting, abdominal pain or diarrhea. She has mild cough due to smoking, which is at her baseline. She states that she has chronic interstitial cystitis, and always has urinary urgency, but no dysuria or burning on urination. Patient does not have unilateral weakness.  ED Course: pt was found to have WBC 15.4, negative troponin, temperature normal, heart rate 60s, O2 saturation 94% on room air, potassium 2.8, magnesium 2.0, negative chest x-ray for acute abnormalities. Patient is placed on telemetry bed for observation.  Review of Systems:     General: no fevers, chills, no changes in body weight,  has fatigue HEENT: no blurry vision, hearing changes or sore throat Respiratory: no dyspnea, has coughing, no wheezing CV: had chest pain, no palpitations GI: has nausea, no vomiting, abdominal pain, diarrhea, constipation GU: no dysuria, burning on urination, has chronic urgency, no hematuria  Ext: no leg edema Neuro: no unilateral weakness, numbness, or tingling, no vision change or hearing loss Skin: no rash, no skin tear. MSK: No muscle spasm, no deformity, no limitation of range of movement in spin Heme: No easy bruising.  Travel history: No recent long distant travel.  Allergy:  Allergies  Allergen Reactions  . Statins Other (See Comments)    Causes muscle weakness and joint pain  . Ace Inhibitors Other (See Comments)    DECREASES HEARTRATE  . Amoxicillin Diarrhea  . Midazolam Hcl     HYPER  . Nsaids     AVOIDS HX GASTRIC ULCER  . Prednisone Other (See Comments)    HYPER  . Sulfa Antibiotics Rash and Other (See Comments)    JOINT PAIN    Past Medical History:  Diagnosis Date  . Agoraphobia without history of panic disorder   . Anxiety   . Asthma   . Coronary artery disease CARDIOLOGIST- DR  Doylene Canard  (VISIT 03-30-11 W/ CHART)   NON-OBS. CAD   (STRESS TEST NOV. 2011  . DDD (degenerative disc disease)   . Depression   . DJD (degenerative joint disease)    JOINT PAIN  . Fibromyalgia   . Frequency of urination   .  GERD (gastroesophageal reflux disease) AND HIATIAL HERNIA   CONTROLLED W/ NEXIUM  . Hemorrhoids   . History of gastric ulcer 2004  . Hypertension   . IBS (irritable bowel syndrome)   . Incisional pain s/p interstim implant 1st stage--- 12-07-11    left upper buttock-- pt states dressing clean dry and intact (on 12-08-11)  . Insomnia   . Iron deficiency anemia   . Nocturia   . Non-productive cough   . Numbness in both hands AT TIMES  . OSA on CPAP   . Urge urinary incontinence     Past  Surgical History:  Procedure Laterality Date  . CARDIAC CATHETERIZATION  2003  . CERVICAL DISCECTOMY  2002   C4 - 7  . CERVICAL FUSION  2008   C4 - T1  . CHOLECYSTECTOMY  1982  . CYSTO/ HOD/ BLADDER BX  2006  . CYSTO/ HOD/ BLADDER BX/ FULGERATION  06-12-2011  . HYSTEROSCOPY W/D&C  2005  . INTERSTIM IMPLANT PLACEMENT  12/07/2011   Procedure: Barrie Lyme IMPLANT FIRST STAGE;  Surgeon: [name], MD;  Location: [facility];  Service: Urology;  Laterality: Right;  . INTERSTIM IMPLANT PLACEMENT  12/14/2011   Procedure: Barrie Lyme IMPLANT SECOND STAGE;  Surgeon: [name], MD;  Location: [facility];  Service: Urology;  Laterality: Right;  rad tech ok by vickie at [facility]  . LAMINECTOMY  2007   L3 - 5  . RIGHT THUMB SURG.  2001  . TONSILLECTOMY AND ADENOIDECTOMY  1965  . WRIST SURGERY Left    [facility]    Social History:  reports that she has been smoking.  She has a 60.00 pack-year smoking history. She has never used smokeless tobacco. She reports that she does not drink alcohol or use drugs.  Family History:  Family History  Problem Relation Age of Onset  . Hypertension Father   . Heart disease Father   . Kidney failure Father   . Breast cancer Mother   . Hypertension Mother   . Coronary artery disease Mother   . Alzheimer's disease Mother   . Hypertension Brother   . Hypertension Sister   . Hypertension Daughter      Prior to Admission medications   Medication Sig Start Date End Date Taking? Authorizing Provider  albuterol (PROVENTIL HFA;VENTOLIN HFA) 108 (90 BASE) MCG/ACT inhaler Inhale 2 puffs into the lungs every 6 (six) hours as needed for wheezing or shortness of breath.   Yes Historical Provider, MD  amLODipine (NORVASC) 5 MG tablet Take 5 mg by mouth every morning.     Yes Historical Provider, MD  Ascorbic Acid (VITAMIN C) 1000 MG tablet Take 1,000 mg by mouth daily.   Yes Historical Provider, MD  aspirin EC 325 MG tablet Take 325  mg by mouth every morning.    Yes Historical Provider, MD  buPROPion (WELLBUTRIN XL) 150 MG 24 hr tablet Take 150 mg by mouth daily.    Yes Historical Provider, MD  cholecalciferol (VITAMIN D) 1000 UNITS tablet Take 1,000 Units by mouth every morning.    Yes Historical Provider, MD  clonazePAM (KLONOPIN) 1 MG tablet Take 1 mg by mouth 2 (two) times daily. 05/10/15  Yes Historical Provider, MD  DULoxetine (CYMBALTA) 60 MG capsule Take 60 mg by mouth at bedtime.    Yes Historical Provider, MD  fenofibrate (TRICOR) 48 MG tablet Take 48 mg by mouth every morning.  04/30/15  Yes Historical Provider, MD  ferrous sulfate 325 (65 FE)  MG tablet Take 325 mg by mouth daily with breakfast.   Yes Historical Provider, MD  HYDROcodone-acetaminophen (NORCO) 5-325 MG per tablet Take 1 tablet by mouth 2 (two) times daily as needed for pain.    Yes Historical Provider, MD  ibuprofen (ADVIL,MOTRIN) 200 MG tablet Take 600 mg by mouth every 6 (six) hours as needed for pain.   Yes Historical Provider, MD  isosorbide mononitrate (IMDUR) 60 MG 24 hr tablet Take 1 tablet (60 mg total) by mouth daily. 04/12/16  Yes Will Meredith Leeds, MD  meclizine (ANTIVERT) 12.5 MG tablet TAKE 1 TABLET 3 TIMES A DAY AS NEEDED FOR DIZZYNESS 01/12/16  Yes Historical Provider, MD  methocarbamol (ROBAXIN) 500 MG tablet Take 1 tablet (500 mg total) by mouth every 8 (eight) hours as needed (for muscle spasms). 02/18/16  Yes Alda Berthold, DO  [place] Stem therapy for overactive bladder implant on the right side.   Yes Historical Provider, MD  SUPER B COMPLEX/C PO Take 1 tablet by mouth every morning.   Yes Historical Provider, MD  traZODone (DESYREL) 50 MG tablet Take 50 mg by mouth at bedtime.     Yes Historical Provider, MD    Physical Exam: Vitals:   08/22/16 0230 08/22/16 0245 08/22/16 0330 08/22/16 0345  BP: 140/70 139/76 147/71 144/78  Pulse: 65 65 62 64  Resp: 25 22 19 22   Temp:      SpO2: 95% 99% 97% 97%  Weight:       Height:       General: Not in acute distress HEENT:       Eyes: PERRL, EOMI, no scleral icterus.       ENT: No discharge from the ears and nose, no pharynx injection, no tonsillar enlargement.        Neck: No JVD, no bruit, no mass felt. Heme: No neck lymph node enlargement. Cardiac: S1/S2, RRR, No murmurs, No gallops or rubs. Respiratory: No rales, wheezing, rhonchi or rubs. GI: Soft, nondistended, nontender, no rebound pain, no organomegaly, BS present. GU: No hematuria Ext: No pitting leg edema bilaterally. 2+DP/PT pulse bilaterally. Musculoskeletal: No joint deformities, No joint redness or warmth, no limitation of ROM in spin. Skin: No rashes.  Neuro: Alert, oriented X3, cranial nerves II-XII grossly intact, moves all extremities normally. Psych: Patient is not psychotic, no suicidal or hemocidal ideation.  Labs on Admission: I have personally reviewed following labs and imaging studies  CBC:  Recent Labs Lab 08/21/16 2325  WBC 15.4*  HGB 15.2*  HCT 45.4  MCV 91.2  PLT 0000000   Basic Metabolic Panel:  Recent Labs Lab 08/21/16 2325 08/22/16 0158  NA 138  --   K 2.8*  --   CL 107  --   CO2 25  --   GLUCOSE 123*  --   BUN 9  --   CREATININE 0.98  --   CALCIUM 9.5  --   MG  --  2.0   GFR: Estimated Creatinine Clearance: 49.9 mL/min (by C-G formula based on SCr of 0.98 mg/dL). Liver Function Tests: No results for input(s): AST, ALT, ALKPHOS, BILITOT, PROT, ALBUMIN in the last 168 hours. No results for input(s): LIPASE, AMYLASE in the last 168 hours. No results for input(s): AMMONIA in the last 168 hours. Coagulation Profile: No results for input(s): INR, PROTIME in the last 168 hours. Cardiac Enzymes: No results for input(s): CKTOTAL, CKMB, CKMBINDEX, TROPONINI in the last 168 hours. BNP (last 3 results) No results for  input(s): PROBNP in the last 8760 hours. HbA1C: No results for input(s): HGBA1C in the last 72 hours. CBG: No results for input(s):  GLUCAP in the last 168 hours. Lipid Profile: No results for input(s): CHOL, HDL, LDLCALC, TRIG, CHOLHDL, LDLDIRECT in the last 72 hours. Thyroid Function Tests: No results for input(s): TSH, T4TOTAL, FREET4, T3FREE, THYROIDAB in the last 72 hours. Anemia Panel: No results for input(s): VITAMINB12, FOLATE, FERRITIN, TIBC, IRON, RETICCTPCT in the last 72 hours. Urine analysis:    Component Value Date/Time   COLORURINE YELLOW 09/17/2008 [place] 09/17/2008 2342   LABSPEC 1.007 09/17/2008 2342   PHURINE 6.5 09/17/2008 [place] 09/17/2008 2342   HGBUR TRACE (A) 09/17/2008 2342   BILIRUBINUR NEGATIVE 09/17/2008 [place] 09/17/2008 2342   PROTEINUR NEGATIVE 09/17/2008 2342   UROBILINOGEN 0.2 09/17/2008 2342   NITRITE NEGATIVE 09/17/2008 2342   LEUKOCYTESUR TRACE (A) 09/17/2008 2342   Sepsis Labs: @LABRCNTIP (procalcitonin:4,lacticidven:4) )No results found for this or any previous visit (from the past 240 hour(s)).   Radiological Exams on Admission: Dg Chest 2 View  Result Date: 08/21/2016 CLINICAL DATA:  Tachycardia and shortness of breath EXAM: CHEST  2 VIEW COMPARISON:  September 03, 2014 FINDINGS: The heart size and mediastinal contours are within normal limits. Both lungs are clear. The visualized skeletal structures are unremarkable. IMPRESSION: No active cardiopulmonary disease. Electronically Signed   By: Dorise Bullion III M.D   On: 08/21/2016 23:42     EKG: Independently reviewed.  Sinus rhythm, QTC 463, mild T-wave inversion in V1 to V2, anteroseptal infarction pattern.  Assessment/Plan Principal Problem:   Chest pain Active Problems:   Leukocytosis   Tobacco abuse   Hypertension   GERD (gastroesophageal reflux disease)   Depression   Coronary artery disease   Anxiety   SVT (supraventricular tachycardia) (HCC)   Hypokalemia   Chest pain, SVT and hx of CAD: Chest pain has resolved. Patient has risk factors  including hypertension, old age, tobacco abuse.  She presented with SVT, with unclear etiology. Currently HR is at 31s.  - will place on Tele bed for obs - cycle CE q6 x3 and repeat her EKG in the am  - Nitroglycerin, Morphine, and aspirin, Imdur, tricor (pt is allergic to statin) - Risk factor stratification: will check FLP, UDSand A1C - check TSH  - 2d echo - please call Card in AM  Hypokalemia: K=2.8 on admission. Mg 2.0 - Repleted  Chronic leukocytosis: WBC 13.5 on 10/07/15-->15.4 today. Pt has been followed up by Dr. Julien Nordmann, last seen was on 10/11/15. Pt had bone Marrow aspirate biopsy on 09/15/15, which showed normal cellular bone marrow with trilinage hematopoiesis. Currently under observation. -f/u with CBC with differential -F/u with dr. Julien Nordmann  Tobacco abuse: -Did counseling about importance of quitting smoking -Nicotine patch  GERD: -Protonix prn  HTN: -continue amlodipin  Depression and anxiety: Stable, no suicidal or homicidal ideations. -Continue home medications: Wellbutrin, Klonopin, Cymbalta  OSA: -CPAP   DVT ppx: SQ Lovenox Code Status: Full code Family Communication: Yes, patient's daughterat bed side Disposition Plan:  Anticipate discharge back to previous home environment Consults called:  None Admission status: Obs / tele  Date of Service 08/22/2016    Ivor Costa [facility] Pager [phone]  If 7PM-7AM, please contact night-coverage [email or web address] Password [email or web address] 08/22/2016, 4:05 AM

## 2016-08-22 NOTE — ED Notes (Signed)
Dr Niu at bedside 

## 2016-08-22 NOTE — Progress Notes (Addendum)
PROGRESS NOTE  [name]  [id number] DOB: 25-Apr-1956 DOA: 08/21/2016 PCP: [name], MD Outpatient Specialists:  Subjective: Seen with her daughter at bedside, denies any complaints feels okay.  Brief Narrative:  [name] is a 60 y.o. female with medical history significant of CAD, hypertension, asthma, tobacco abuse, GERD, depression, anxiety, fibromyalgia, gastric ulcer, IBS, OSA on CPAP, chronic interstitial cystitis, chronic leukocytosis, who presents with palpitation and chest pain.   Patient reports that she had one episode of chest pain, which happened at about 2:30 PM. The chest pain was located in the substernal area, 4 out of 10 in severity, pressure-like pain, nonradiating. It lasted for about 10 minutes, then resolved spontaneously. It was not aggravated or alleviated by any known factors. Patient did not have shortness of breath, fever or chills. Patient states that she started having palpitations and heart racing at about 9:30 PM. Per EMS report, patient had SVT with heart rates of 150-160.  EMS gave 6mg , 12mg , and 12mg  of adenosine without improvement. Pt also given 20mg  cardizem with decrease in pulse to 120-130. At arrival to ED, her HR is at 29s. Pt reports similar episode last week. She took a muscle relaxer and symptoms resolved.   Patient has mild nausea, but no vomiting, abdominal pain or diarrhea. She has mild cough due to smoking, which is at her baseline. She states that she has chronic interstitial cystitis, and always has urinary urgency, but no dysuria or burning on urination. Patient does not have unilateral weakness.  Assessment & Plan:   Principal Problem:   Chest pain Active Problems:   Leukocytosis   Tobacco abuse   Hypertension   GERD (gastroesophageal reflux disease)   Depression   Coronary artery disease   Anxiety   SVT (supraventricular tachycardia) (HCC)   Hypokalemia  This is a no charge note, patient seen earlier today by my  colleague Dr. Blaine Hamper. Patient seen and examined, and data base reviewed, chest pain and SVT, slightly elevated troponin and hypokalemia.  Chest pain, SVT and hx of CAD: Chest pain has resolved. Patient has risk factors including hypertension, old age, tobacco abuse.  She presented with SVT, with unclear etiology. Currently HR is at 29s. -Has a slightly elevated troponin, the trend does not necessarily suggest ACS, this is likely secondary to tachycardia. -Chest pain-free, no EKG changes. -Cardiology to evaluate.  SVT -Reportedly heart rate was 150-160, received 3 doses of adenosine without help, received Cardizem. -Currently heart rate in the 60s. -TSH is normal, avoid caffeine, nicotine and electrolyte disturbances, magnesium is normal. Keep potassium >4.0.  Hypokalemia: K=2.8 on admission. Mg 2.0 - Repleted with oral and parenteral supplements, check BMP in a.m.  Chronic leukocytosis: WBC 13.5 on 10/07/15-->15.4 today. Pt has been followed up by Dr. Julien Nordmann, last seen was on 10/11/15. Pt had bone Marrow aspirate biopsy on 09/15/15, which showed normal cellular bone marrow with trilinage hematopoiesis. Currently under observation. -f/u with CBC with differential -F/u with dr. Julien Nordmann  Tobacco abuse: -Did counseling about importance of quitting smoking -Nicotine patch  GERD: -Protonix prn  HTN: -continue amlodipin  Depression and anxiety: Stable, no suicidal or homicidal ideations. -Continue home medications: Wellbutrin, Klonopin, Cymbalta  OSA: -CPAP    DVT prophylaxis: Lovenox Code Status: Full Code Family Communication:  Disposition Plan: Await cardiology evaluation. Diet: Diet NPO time specified Except for: [employer], Sips with Meds  Consultants:   Cardiology  Procedures:   None  Antimicrobials:   None  Objective: Vitals:  08/22/16 0330 08/22/16 0345 08/22/16 0411 08/22/16 1022  BP: 147/71 144/78 (!) 168/68 (!) 146/82  Pulse: 62 64 70 66  Resp: 19  22 18 18   Temp:   98 F (36.7 C) 97.8 F (36.6 C)  TempSrc:   Oral Oral  SpO2: 97% 97% 96% 100%  Weight:   66.5 kg (146 lb 11.2 oz)   Height:   4' 11.5" (1.511 m)     Intake/Output Summary (Last 24 hours) at 08/22/16 1104 Last data filed at 08/22/16 0730  Gross per 24 hour  Intake              200 ml  Output                0 ml  Net              200 ml   Filed Weights   08/21/16 2316 08/22/16 0411  Weight: 68 kg (150 lb) 66.5 kg (146 lb 11.2 oz)    Examination: General exam: Appears calm and comfortable  Respiratory system: Clear to auscultation. Respiratory effort normal. Cardiovascular system: S1 & S2 heard, RRR. No JVD, murmurs, rubs, gallops or clicks. No pedal edema. Gastrointestinal system: Abdomen is nondistended, soft and nontender. No organomegaly or masses felt. Normal bowel sounds heard. Central nervous system: Alert and oriented. No focal neurological deficits. Extremities: Symmetric 5 x 5 power. Skin: No rashes, lesions or ulcers Psychiatry: Judgement and insight appear normal. Mood & affect appropriate.   Data Reviewed: I have personally reviewed following labs and imaging studies  CBC:  Recent Labs Lab 08/21/16 2325 08/22/16 0351  WBC 15.4* 14.7*  NEUTROABS  --  10.5*  HGB 15.2* 13.2  HCT 45.4 39.7  MCV 91.2 90.4  PLT 296 0000000   Basic Metabolic Panel:  Recent Labs Lab 08/21/16 2325 08/22/16 0158  NA 138  --   K 2.8*  --   CL 107  --   CO2 25  --   GLUCOSE 123*  --   BUN 9  --   CREATININE 0.98  --   CALCIUM 9.5  --   MG  --  2.0   GFR: Estimated Creatinine Clearance: 50 mL/min (by C-G formula based on SCr of 0.98 mg/dL). Liver Function Tests: No results for input(s): AST, ALT, ALKPHOS, BILITOT, PROT, ALBUMIN in the last 168 hours. No results for input(s): LIPASE, AMYLASE in the last 168 hours. No results for input(s): AMMONIA in the last 168 hours. Coagulation Profile:  Recent Labs Lab 08/22/16 0351  INR 1.02   Cardiac  Enzymes:  Recent Labs Lab 08/22/16 0351 08/22/16 0909  TROPONINI 0.05* 0.04*   BNP (last 3 results) No results for input(s): PROBNP in the last 8760 hours. HbA1C: No results for input(s): HGBA1C in the last 72 hours. CBG: No results for input(s): GLUCAP in the last 168 hours. Lipid Profile:  Recent Labs  08/22/16 0351  CHOL 160  HDL 34*  LDLCALC 100*  TRIG 131  CHOLHDL 4.7   Thyroid Function Tests:  Recent Labs  08/22/16 0351  TSH 2.935   Anemia Panel: No results for input(s): VITAMINB12, FOLATE, FERRITIN, TIBC, IRON, RETICCTPCT in the last 72 hours. Urine analysis:    Component Value Date/Time   COLORURINE YELLOW 09/17/2008 [place] 09/17/2008 2342   LABSPEC 1.007 09/17/2008 2342   PHURINE 6.5 09/17/2008 [place] 09/17/2008 [place] (A) 09/17/2008 [place] 09/17/2008 2342  KETONESUR NEGATIVE 09/17/2008 [place] 09/17/2008 2342   UROBILINOGEN 0.2 09/17/2008 2342   NITRITE NEGATIVE 09/17/2008 2342   LEUKOCYTESUR TRACE (A) 09/17/2008 2342   Sepsis Labs: @LABRCNTIP (procalcitonin:4,lacticidven:4)  )No results found for this or any previous visit (from the past 240 hour(s)).   Invalid input(s): PROCALCITONIN, LACTICACIDVEN   Radiology Studies: Dg Chest 2 View  Result Date: 08/21/2016 CLINICAL DATA:  Tachycardia and shortness of breath EXAM: CHEST  2 VIEW COMPARISON:  September 03, 2014 FINDINGS: The heart size and mediastinal contours are within normal limits. Both lungs are clear. The visualized skeletal structures are unremarkable. IMPRESSION: No active cardiopulmonary disease. Electronically Signed   By: Dorise Bullion III M.D   On: 08/21/2016 23:42        Scheduled Meds: . amLODipine  5 mg Oral Daily  . aspirin EC  325 mg Oral q morning - 10a  . B-complex with vitamin C  1 tablet Oral Daily  . buPROPion  150 mg Oral Daily  . cholecalciferol  1,000 Units Oral q  morning - 10a  . clonazePAM  1 mg Oral BID  . DULoxetine  60 mg Oral QHS  . enoxaparin (LOVENOX) injection  40 mg Subcutaneous Daily  . fenofibrate  54 mg Oral Daily  . ferrous sulfate  325 mg Oral Q breakfast  . isosorbide mononitrate  60 mg Oral Daily  . nicotine  21 mg Transdermal Daily  . traZODone  50 mg Oral QHS  . vitamin C  1,000 mg Oral Daily   Continuous Infusions: . sodium chloride 75 mL/hr at 08/22/16 0421     LOS: 0 days    Time spent: 35 minutes    Jailin Moomaw A, MD [facility] Pager [phone]  If 7PM-7AM, please contact night-coverage [email or web address] Password [email or web address] 08/22/2016, 11:04 AM

## 2016-08-22 NOTE — Consult Note (Signed)
Referring Physician:  LINDE WILENSKY is an 60 y.o. female.                       Chief Complaint: Chest pain and palpitation  HPI: 60 years old female has pressure type, substernal, non-radiating chest pain with abnormal cardiac enzymes. Her SVT responded to IV diltiazem when 3 doses (6, 12, 12 mg) of adenosine did not work. Currently she is in sinus rhythm.   Past Medical History:  Diagnosis Date  . Agoraphobia without history of panic disorder   . Anxiety   . Asthma   . Coronary artery disease CARDIOLOGIST- DR  Doylene Canard  (VISIT 03-30-11 W/ CHART)   NON-OBS. CAD   (STRESS TEST NOV. 2011  . DDD (degenerative disc disease)   . Depression   . DJD (degenerative joint disease)    JOINT PAIN  . Fibromyalgia   . Frequency of urination   . GERD (gastroesophageal reflux disease) AND HIATIAL HERNIA   CONTROLLED W/ NEXIUM  . Hemorrhoids   . History of gastric ulcer 2004  . Hypertension   . IBS (irritable bowel syndrome)   . Incisional pain s/p interstim implant 1st stage--- 12-07-11    left upper buttock-- pt states dressing clean dry and intact (on 12-08-11)  . Insomnia   . Iron deficiency anemia   . Nocturia   . Non-productive cough   . Numbness in both hands AT TIMES  . OSA on CPAP   . Urge urinary incontinence       Past Surgical History:  Procedure Laterality Date  . CARDIAC CATHETERIZATION  2003  . CERVICAL DISCECTOMY  2002   C4 - 7  . CERVICAL FUSION  2008   C4 - T1  . CHOLECYSTECTOMY  1982  . CYSTO/ HOD/ BLADDER BX  2006  . CYSTO/ HOD/ BLADDER BX/ FULGERATION  06-12-2011  . HYSTEROSCOPY W/D&C  2005  . INTERSTIM IMPLANT PLACEMENT  12/07/2011   Procedure: Barrie Lyme IMPLANT FIRST STAGE;  Surgeon: [name], MD;  Location: [facility];  Service: Urology;  Laterality: Right;  . INTERSTIM IMPLANT PLACEMENT  12/14/2011   Procedure: Barrie Lyme IMPLANT SECOND STAGE;  Surgeon: [name], MD;  Location: [facility];  Service:  Urology;  Laterality: Right;  rad tech ok by vickie at [facility]  . LAMINECTOMY  2007   L3 - 5  . RIGHT THUMB SURG.  2001  . TONSILLECTOMY AND ADENOIDECTOMY  1965  . WRIST SURGERY Left    [facility]    Family History  Problem Relation Age of Onset  . Hypertension Father   . Heart disease Father   . Kidney failure Father   . Breast cancer Mother   . Hypertension Mother   . Coronary artery disease Mother   . Alzheimer's disease Mother   . Hypertension Brother   . Hypertension Sister   . Hypertension Daughter    Social History:  reports that she has been smoking.  She has a 60.00 pack-year smoking history. She has never used smokeless tobacco. She reports that she does not drink alcohol or use drugs.  Allergies:  Allergies  Allergen Reactions  . Statins Other (See Comments)    Causes muscle weakness and joint pain  . Ace Inhibitors Other (See Comments)    DECREASES HEARTRATE  . Amoxicillin Diarrhea  . Midazolam Hcl     HYPER  . Nsaids     AVOIDS HX GASTRIC ULCER  . Prednisone Other (  See Comments)    HYPER  . Sulfa Antibiotics Rash and Other (See Comments)    JOINT PAIN    Medications Prior to Admission  Medication Sig Dispense Refill  . albuterol (PROVENTIL HFA;VENTOLIN HFA) 108 (90 BASE) MCG/ACT inhaler Inhale 2 puffs into the lungs every 6 (six) hours as needed for wheezing or shortness of breath.    Marland Kitchen amLODipine (NORVASC) 5 MG tablet Take 5 mg by mouth every morning.      . Ascorbic Acid (VITAMIN C) 1000 MG tablet Take 1,000 mg by mouth daily.    Marland Kitchen aspirin EC 325 MG tablet Take 325 mg by mouth every morning.     Marland Kitchen buPROPion (WELLBUTRIN XL) 150 MG 24 hr tablet Take 150 mg by mouth daily.     . cholecalciferol (VITAMIN D) 1000 UNITS tablet Take 1,000 Units by mouth every morning.     . clonazePAM (KLONOPIN) 1 MG tablet Take 1 mg by mouth 2 (two) times daily.  5  . DULoxetine (CYMBALTA) 60 MG capsule Take 60 mg by mouth at bedtime.     . fenofibrate (TRICOR) 48 MG tablet Take 48  mg by mouth every morning.   6  . ferrous sulfate 325 (65 FE) MG tablet Take 325 mg by mouth daily with breakfast.    . HYDROcodone-acetaminophen (NORCO) 5-325 MG per tablet Take 1 tablet by mouth 2 (two) times daily as needed for pain.     Marland Kitchen ibuprofen (ADVIL,MOTRIN) 200 MG tablet Take 600 mg by mouth every 6 (six) hours as needed for pain.    . isosorbide mononitrate (IMDUR) 60 MG 24 hr tablet Take 1 tablet (60 mg total) by mouth daily. 90 tablet 3  . meclizine (ANTIVERT) 12.5 MG tablet TAKE 1 TABLET 3 TIMES A DAY AS NEEDED FOR DIZZYNESS  0  . methocarbamol (ROBAXIN) 500 MG tablet Take 1 tablet (500 mg total) by mouth every 8 (eight) hours as needed (for muscle spasms). 45 tablet 3  . PRESCRIPTION MEDICATION Inner Stem therapy for overactive bladder implant on the right side.    . SUPER B COMPLEX/C PO Take 1 tablet by mouth every morning.    . traZODone (DESYREL) 50 MG tablet Take 50 mg by mouth at bedtime.        Results for orders placed or performed during the hospital encounter of 08/21/16 (from the past 48 hour(s))  Basic metabolic panel     Status: Abnormal   Collection Time: 08/21/16 11:25 PM  Result Value Ref Range   Sodium 138 135 - 145 mmol/L   Potassium 2.8 (L) 3.5 - 5.1 mmol/L   Chloride 107 101 - 111 mmol/L   CO2 25 22 - 32 mmol/L   Glucose, Bld 123 (H) 65 - 99 mg/dL   BUN 9 6 - 20 mg/dL   Creatinine, Ser 0.98 0.44 - 1.00 mg/dL   Calcium 9.5 8.9 - 10.3 mg/dL   GFR calc non Af Amer >60 >60 mL/min   GFR calc Af Amer >60 >60 mL/min    Comment: (NOTE) The eGFR has been calculated using the CKD EPI equation. This calculation has not been validated in all clinical situations. eGFR's persistently <60 mL/min signify possible Chronic Kidney Disease.    Anion gap 6 5 - 15  CBC     Status: Abnormal   Collection Time: 08/21/16 11:25 PM  Result Value Ref Range   WBC 15.4 (H) 4.0 - 10.5 K/uL   RBC 4.98 3.87 - 5.11 MIL/uL  Hemoglobin 15.2 (H) 12.0 - 15.0 g/dL   HCT 45.4 36.0 -  46.0 %   MCV 91.2 78.0 - 100.0 fL   MCH 30.5 26.0 - 34.0 pg   MCHC 33.5 30.0 - 36.0 g/dL   RDW 12.7 11.5 - 15.5 %   Platelets 296 150 - 400 K/uL  I-stat troponin, ED     Status: None   Collection Time: 08/21/16 11:33 PM  Result Value Ref Range   Troponin i, poc 0.00 0.00 - 0.08 ng/mL   Comment 3            Comment: Due to the release kinetics of cTnI, a negative result within the first hours of the onset of symptoms does not rule out myocardial infarction with certainty. If myocardial infarction is still suspected, repeat the test at appropriate intervals.   Magnesium     Status: None   Collection Time: 08/22/16  1:58 AM  Result Value Ref Range   Magnesium 2.0 1.7 - 2.4 mg/dL  Lipid panel     Status: Abnormal   Collection Time: 08/22/16  3:51 AM  Result Value Ref Range   Cholesterol 160 0 - 200 mg/dL   Triglycerides 131 <150 mg/dL   HDL 34 (L) >40 mg/dL   Total CHOL/HDL Ratio 4.7 RATIO   VLDL 26 0 - 40 mg/dL   LDL Cholesterol 100 (H) 0 - 99 mg/dL    Comment:        Total Cholesterol/HDL:CHD Risk Coronary Heart Disease Risk Table                     Men   Women  1/2 Average Risk   3.4   3.3  Average Risk       5.0   4.4  2 X Average Risk   9.6   7.1  3 X Average Risk  23.4   11.0        Use the calculated Patient Ratio above and the CHD Risk Table to determine the patient's CHD Risk.        ATP III CLASSIFICATION (LDL):  <100     mg/dL   Optimal  100-129  mg/dL   Near or Above                    Optimal  130-159  mg/dL   Borderline  160-189  mg/dL   High  >190     mg/dL   Very High   Troponin I (q 6hr x 3)     Status: Abnormal   Collection Time: 08/22/16  3:51 AM  Result Value Ref Range   Troponin I 0.05 (HH) <0.03 ng/mL    Comment: CRITICAL RESULT CALLED TO, READ BACK BY AND VERIFIED WITH: STRADLIN S,RN 08/22/16 0441 WAYK   TSH     Status: None   Collection Time: 08/22/16  3:51 AM  Result Value Ref Range   TSH 2.935 0.350 - 4.500 uIU/mL  CBC with  Differential/Platelet     Status: Abnormal   Collection Time: 08/22/16  3:51 AM  Result Value Ref Range   WBC 14.7 (H) 4.0 - 10.5 K/uL   RBC 4.39 3.87 - 5.11 MIL/uL   Hemoglobin 13.2 12.0 - 15.0 g/dL   HCT 39.7 36.0 - 46.0 %   MCV 90.4 78.0 - 100.0 fL   MCH 30.1 26.0 - 34.0 pg   MCHC 33.2 30.0 - 36.0 g/dL   RDW 12.7 11.5 - 15.5 %  Platelets 261 150 - 400 K/uL   Neutrophils Relative % 71 %   Lymphocytes Relative 20 %   Monocytes Relative 6 %   Eosinophils Relative 3 %   Basophils Relative 0 %   Neutro Abs 10.5 (H) 1.7 - 7.7 K/uL   Lymphs Abs 2.9 0.7 - 4.0 K/uL   Monocytes Absolute 0.9 0.1 - 1.0 K/uL   Eosinophils Absolute 0.4 0.0 - 0.7 K/uL   Basophils Absolute 0.0 0.0 - 0.1 K/uL   WBC Morphology ATYPICAL LYMPHOCYTES   Protime-INR     Status: None   Collection Time: 08/22/16  3:51 AM  Result Value Ref Range   Prothrombin Time 13.4 11.4 - 15.2 seconds   INR 1.02   Urine rapid drug screen (hosp performed)     Status: Abnormal   Collection Time: 08/22/16  8:55 AM  Result Value Ref Range   Opiates POSITIVE (A) NONE DETECTED   Cocaine NONE DETECTED NONE DETECTED   Benzodiazepines NONE DETECTED NONE DETECTED   Amphetamines NONE DETECTED NONE DETECTED   Tetrahydrocannabinol NONE DETECTED NONE DETECTED   Barbiturates NONE DETECTED NONE DETECTED    Comment:        DRUG SCREEN FOR MEDICAL PURPOSES ONLY.  IF CONFIRMATION IS NEEDED FOR ANY PURPOSE, NOTIFY LAB WITHIN 5 DAYS.        LOWEST DETECTABLE LIMITS FOR URINE DRUG SCREEN Drug Class       Cutoff (ng/mL) Amphetamine      1000 Barbiturate      200 Benzodiazepine   132 Tricyclics       440 Opiates          300 Cocaine          300 THC              50   Troponin I (q 6hr x 3)     Status: Abnormal   Collection Time: 08/22/16  9:09 AM  Result Value Ref Range   Troponin I 0.04 (HH) <0.03 ng/mL    Comment: CRITICAL VALUE NOTED.  VALUE IS CONSISTENT WITH PREVIOUSLY REPORTED AND CALLED VALUE.   Dg Chest 2 View  Result  Date: 08/21/2016 CLINICAL DATA:  Tachycardia and shortness of breath EXAM: CHEST  2 VIEW COMPARISON:  September 03, 2014 FINDINGS: The heart size and mediastinal contours are within normal limits. Both lungs are clear. The visualized skeletal structures are unremarkable. IMPRESSION: No active cardiopulmonary disease. Electronically Signed   By: Dorise Bullion III M.D   On: 08/21/2016 23:42    Review Of Systems Constitutional: No fever. Malaise Respiratory: Positive shortness of breath, bronchitis. Cardiology: Positive Chest pain Gastroenterology: Occasional nausea and diarrhea. No constipation. No GI bleed. Genitourinary: Positive cystitis.  Neurology: No stroke or seizures. Chronic headaches. Skin: No rash. Hematology: Chronic mild leukocytosis, No excessive bleeding. Immunology: No recurrent infection or lymphadenopathy.  Blood pressure 137/70, pulse 72, temperature 98.7 F (37.1 C), temperature source Oral, resp. rate 18, height 4' 11.5" (1.511 m), weight 66.5 kg (146 lb 11.2 oz), last menstrual period 09/06/2005, SpO2 100 %. General appearance: alert, cooperative, appears older than stated age and mild distress Head: Normocephalic, without obvious abnormality, atraumatic Eyes: conjunctivae/corneas clear. PERRL, EOM's intact. Neck: no adenopathy, no carotid bruit, no JVD, supple, symmetrical, trachea midline and thyroid not enlarged. Resp: clear to auscultation bilaterally Cardio: regular rate and rhythm, S1, S2 normal, II/VI systolic murmur, no click, rub or gallop GI: soft, non-tender; bowel sounds normal; no masses,  no organomegaly. Extremities: extremities normal,  atraumatic, no cyanosis or edema Skin: Skin color, texture, turgor normal. No rashes or lesions Neurologic: Alert and oriented X 3, normal strength and tone. Normal coordination and gait  Assessment/Plan Unstable angina CAD SVT Chronic bronchitis Chronic cystitis Hypertension  Agree with IV heparin Cardiac cath  in AM.   Birdie Riddle, MD  08/22/2016, 2:34 PM

## 2016-08-22 NOTE — ED Notes (Addendum)
Pt now c/o chest pressure, 4/10, and a headache. Repeat ekg given to Dr. Leonides Schanz. Notified Leisa PA of chest pressure

## 2016-08-22 NOTE — ED Notes (Signed)
Pt c/o nausea, denies chest pain. Zofran given. Requesting update on plan of care. Informed pt and daughter of consult for admission

## 2016-08-23 ENCOUNTER — Encounter (HOSPITAL_COMMUNITY): Payer: Self-pay | Admitting: Cardiovascular Disease

## 2016-08-23 ENCOUNTER — Encounter (HOSPITAL_COMMUNITY)
Admission: EM | Disposition: A | Discharge status: Home / Self Care | Payer: Self-pay | Source: Home / Self Care | Attending: Emergency Medicine

## 2016-08-23 DIAGNOSIS — I471 Supraventricular tachycardia: Secondary | ICD-10-CM

## 2016-08-23 DIAGNOSIS — E876 Hypokalemia: Secondary | ICD-10-CM | POA: Diagnosis not present

## 2016-08-23 DIAGNOSIS — F329 Major depressive disorder, single episode, unspecified: Secondary | ICD-10-CM | POA: Diagnosis not present

## 2016-08-23 DIAGNOSIS — R079 Chest pain, unspecified: Secondary | ICD-10-CM | POA: Diagnosis not present

## 2016-08-23 DIAGNOSIS — I25110 Atherosclerotic heart disease of native coronary artery with unstable angina pectoris: Secondary | ICD-10-CM | POA: Diagnosis not present

## 2016-08-23 DIAGNOSIS — I10 Essential (primary) hypertension: Secondary | ICD-10-CM | POA: Diagnosis not present

## 2016-08-23 DIAGNOSIS — F17210 Nicotine dependence, cigarettes, uncomplicated: Secondary | ICD-10-CM | POA: Diagnosis not present

## 2016-08-23 DIAGNOSIS — D72829 Elevated white blood cell count, unspecified: Secondary | ICD-10-CM | POA: Diagnosis not present

## 2016-08-23 DIAGNOSIS — Z720 Tobacco use: Secondary | ICD-10-CM

## 2016-08-23 HISTORY — PX: CARDIAC CATHETERIZATION: SHX172

## 2016-08-23 LAB — BASIC METABOLIC PANEL
Anion gap: 14 (ref 5–15)
BUN: 10 mg/dL (ref 6–20)
CALCIUM: 8.9 mg/dL (ref 8.9–10.3)
CO2: 22 mmol/L (ref 22–32)
CREATININE: 0.96 mg/dL (ref 0.44–1.00)
Chloride: 105 mmol/L (ref 101–111)
GFR calc non Af Amer: >60 mL/min (ref >60)
Glucose, Bld: 104 mg/dL — ABNORMAL HIGH (ref 65–99)
Potassium: 3.7 mmol/L (ref 3.5–5.1)
SODIUM: 141 mmol/L (ref 135–145)

## 2016-08-23 LAB — CBC
HCT: 44.8 % (ref 36.0–46.0)
Hemoglobin: 14.4 g/dL (ref 12.0–15.0)
MCH: 30.1 pg (ref 26.0–34.0)
MCHC: 32.1 g/dL (ref 30.0–36.0)
MCV: 93.7 fL (ref 78.0–100.0)
PLATELETS: 245 10*3/uL (ref 150–400)
RBC: 4.78 MIL/uL (ref 3.87–5.11)
RDW: 12.6 % (ref 11.5–15.5)
WBC: 12.2 10*3/uL — AB (ref 4.0–10.5)

## 2016-08-23 LAB — PROTIME-INR
INR: 1
Prothrombin Time: 13.2 seconds (ref 11.4–15.2)

## 2016-08-23 LAB — HEMOGLOBIN A1C
Hgb A1c MFr Bld: 5.4 % (ref 4.8–5.6)
MEAN PLASMA GLUCOSE: 108 mg/dL

## 2016-08-23 LAB — POCT ACTIVATED CLOTTING TIME: ACTIVATED CLOTTING TIME: 120 s

## 2016-08-23 SURGERY — LEFT HEART CATH AND CORONARY ANGIOGRAPHY
Anesthesia: LOCAL

## 2016-08-23 MED ORDER — FENTANYL CITRATE (PF) 100 MCG/2ML IJ SOLN
INTRAMUSCULAR | Status: DC | PRN
  Administered 2016-08-23 (×2): 50 ug via INTRAVENOUS

## 2016-08-23 MED ORDER — HEPARIN (PORCINE) IN NACL 2-0.9 UNIT/ML-% IJ SOLN
INTRAMUSCULAR | Status: AC
  Filled 2016-08-23: qty 1500

## 2016-08-23 MED ORDER — SODIUM CHLORIDE 0.9 % IV SOLN
INTRAVENOUS | Status: DC
  Administered 2016-08-23: 14:00:00 via INTRAVENOUS

## 2016-08-23 MED ORDER — LIDOCAINE HCL (PF) 1 % IJ SOLN
INTRAMUSCULAR | Status: DC | PRN
  Administered 2016-08-23: 20 mL

## 2016-08-23 MED ORDER — LIDOCAINE HCL (PF) 1 % IJ SOLN
INTRAMUSCULAR | Status: AC
  Filled 2016-08-23: qty 30

## 2016-08-23 MED ORDER — HEPARIN (PORCINE) IN NACL 2-0.9 UNIT/ML-% IJ SOLN
INTRAMUSCULAR | Status: DC | PRN
Start: 2016-08-23 — End: 2016-08-23
  Administered 2016-08-23: 1500 mL

## 2016-08-23 MED ORDER — IOPAMIDOL (ISOVUE-370) INJECTION 76%
INTRAVENOUS | Status: AC
  Filled 2016-08-23: qty 100

## 2016-08-23 MED ORDER — FENTANYL CITRATE (PF) 100 MCG/2ML IJ SOLN
INTRAMUSCULAR | Status: AC
  Filled 2016-08-23: qty 2

## 2016-08-23 MED ORDER — IOPAMIDOL (ISOVUE-370) INJECTION 76%
INTRAVENOUS | Status: DC | PRN
  Administered 2016-08-23: 80 mL via INTRA_ARTERIAL

## 2016-08-23 SURGICAL SUPPLY — 9 items
CATH INFINITI 5 FR JL3.5 (CATHETERS) ×1 IMPLANT
CATH INFINITI 5FR MULTPACK ANG (CATHETERS) ×1 IMPLANT
KIT HEART LEFT (KITS) ×2 IMPLANT
PACK CARDIAC CATHETERIZATION (CUSTOM PROCEDURE TRAY) ×2 IMPLANT
SHEATH PINNACLE 5F 10CM (SHEATH) ×1 IMPLANT
SYR MEDRAD MARK V 150ML (SYRINGE) ×2 IMPLANT
TRANSDUCER W/STOPCOCK (MISCELLANEOUS) ×2 IMPLANT
TUBING CIL FLEX 10 FLL-RA (TUBING) IMPLANT
WIRE EMERALD 3MM-J .035X150CM (WIRE) ×1 IMPLANT

## 2016-08-23 NOTE — Progress Notes (Addendum)
SUBJECTIVE: The patient is doing well today.  At this time, she denies chest pain, shortness of breath, or any new concerns. Has had no further tachycardia overnight.  Was placed on 12.5 mg metoprolol yesterday.   Marland Kitchen amLODipine  5 mg Oral Daily  . aspirin EC  325 mg Oral q morning - 10a  . B-complex with vitamin C  1 tablet Oral Daily  . buPROPion  150 mg Oral Daily  . cholecalciferol  1,000 Units Oral q morning - 10a  . clonazePAM  1 mg Oral BID  . DULoxetine  60 mg Oral QHS  . enoxaparin (LOVENOX) injection  40 mg Subcutaneous Daily  . fenofibrate  54 mg Oral Daily  . ferrous sulfate  325 mg Oral Q breakfast  . isosorbide mononitrate  60 mg Oral Daily  . metoprolol tartrate  12.5 mg Oral BID  . nicotine  21 mg Transdermal Daily  . sodium chloride flush  3 mL Intravenous Q12H  . traZODone  50 mg Oral QHS  . vitamin C  1,000 mg Oral Daily   . sodium chloride 75 mL/hr at 08/22/16 0421  . sodium chloride 50 mL/hr at 08/22/16 1638    OBJECTIVE: Physical Exam: Vitals:   08/22/16 1022 08/22/16 1332 08/22/16 1941 08/23/16 0319  BP: (!) 146/82 137/70 (!) 148/71 140/73  Pulse: 66 72 82 61  Resp: 18 18 19 18   Temp: 97.8 F (36.6 C) 98.7 F (37.1 C) 98 F (36.7 C) 98 F (36.7 C)  TempSrc: Oral Oral Oral Oral  SpO2: 100% 100% 97% 96%  Weight:      Height:        Intake/Output Summary (Last 24 hours) at 08/23/16 0744 Last data filed at 08/23/16 [id number]  Gross per 24 hour  Intake          2219.17 ml  Output              903 ml  Net          1316.17 ml    Telemetry reveals sinus rhythm  GEN- The patient is well appearing, alert and oriented x 3 today.   Head- normocephalic, atraumatic Eyes-  Sclera clear, conjunctiva pink Ears- hearing intact Oropharynx- clear Neck- supple, no JVP Lymph- no cervical lymphadenopathy Lungs- Clear to ausculation bilaterally, normal work of breathing Heart- Regular rate and rhythm, no murmurs, rubs or gallops, PMI not laterally  displaced GI- soft, NT, ND, + BS Extremities- no clubbing, cyanosis, or edema Skin- no rash or lesion Psych- euthymic mood, full affect Neuro- strength and sensation are intact  LABS: Basic Metabolic Panel:  Recent Labs  08/21/16 2325 08/22/16 0158 08/23/16 0339  NA 138  --  141  K 2.8*  --  3.7  CL 107  --  105  CO2 25  --  22  GLUCOSE 123*  --  104*  BUN 9  --  10  CREATININE 0.98  --  0.96  CALCIUM 9.5  --  8.9  MG  --  2.0  --    Liver Function Tests: No results for input(s): AST, ALT, ALKPHOS, BILITOT, PROT, ALBUMIN in the last 72 hours. No results for input(s): LIPASE, AMYLASE in the last 72 hours. CBC:  Recent Labs  08/22/16 0351 08/23/16 0339  WBC 14.7* 12.2*  NEUTROABS 10.5*  --   HGB 13.2 14.4  HCT 39.7 44.8  MCV 90.4 93.7  PLT 261 245   Cardiac Enzymes:  Recent Labs  08/22/16 0351  08/22/16 0909 08/22/16 1520  TROPONINI 0.05* 0.04* 0.03*   BNP: Invalid input(s): POCBNP D-Dimer: No results for input(s): DDIMER in the last 72 hours. Hemoglobin A1C:  Recent Labs  08/22/16 0351  HGBA1C 5.4   Fasting Lipid Panel:  Recent Labs  08/22/16 0351  CHOL 160  HDL 34*  LDLCALC 100*  TRIG 131  CHOLHDL 4.7   Thyroid Function Tests:  Recent Labs  08/22/16 0351  TSH 2.935   Anemia Panel: No results for input(s): VITAMINB12, FOLATE, FERRITIN, TIBC, IRON, RETICCTPCT in the last 72 hours.  RADIOLOGY: Dg Chest 2 View  Result Date: 08/21/2016 CLINICAL DATA:  Tachycardia and shortness of breath EXAM: CHEST  2 VIEW COMPARISON:  September 03, 2014 FINDINGS: The heart size and mediastinal contours are within normal limits. Both lungs are clear. The visualized skeletal structures are unremarkable. IMPRESSION: No active cardiopulmonary disease. Electronically Signed   By: Dorise Bullion III M.D   On: 08/21/2016 23:42    ASSESSMENT AND PLAN:  Principal Problem:   Chest pain Active Problems:   Leukocytosis   Tobacco abuse   Hypertension   GERD  (gastroesophageal reflux disease)   Depression   Coronary artery disease   Anxiety   SVT (supraventricular tachycardia) (HCC)   Hypokalemia  1.  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 She was previously on BB but this was discontinued 2/2 bradycardia (heart rates 40-50) and fatigue.  She has had increasing palpitations off medical therapy.   Plan for restarting metoprolol 12.5 mg to help with palpitations.  Patient feeling improved this AM.  2.  Chest pain/flat troponin trend Plan for LHC with Dr Doylene Canard Continue medical therapy  3.  OSA Non complaint with CPAP Relationship between OSA and atrial arrhythmias discussed with patient today I have encouraged CPAP compliance, possibly the cause of her daytime fatigue  4.  Fatigue The patient has persistent fatigue and weakness similar to when she was on BB therapy this admission in the setting of sinus rhythm and normal HR I am unsure how much her bradycardia was contributing to her symptoms, possibly due to untreated OSA  5.  Paroxysmal atrial fibrillation Seen on EMS strips post adenosine administration Would follow for recurrence CHADS2VASC is 3 and she would require [place] if she has documented recurrent AF  6.  HTN Stable No change required today  7.  Tobacco abuse Cessation recommended   Kima Malenfant set up outpatient EP visit.  EP to sign off today.  Please call back if further questions.  Maelys Kinnick Meredith Leeds, MD 08/23/2016 7:44 AM

## 2016-08-23 NOTE — Interval H&P Note (Signed)
History and Physical Interval Note:  08/23/2016 10:02 AM  [name]  has presented today for surgery, with the diagnosis of cp  The various methods of treatment have been discussed with the patient and family. After consideration of risks, benefits and other options for treatment, the patient has consented to  Procedure(s): Left Heart Cath and Coronary Angiography (N/A) as a surgical intervention .  The patient's history has been reviewed, patient examined, no change in status, stable for surgery.  I have reviewed the patient's chart and labs.  Questions were answered to the patient's satisfaction.     Leesha Veno S

## 2016-08-23 NOTE — Progress Notes (Signed)
31F Sheath removed from Rt Femoral Artery. Manual pressure applied for 25 minutes. Hemostasis achieved. Rt groin soft with no active bleeding or hematoma noted. VSS. Patient states no pain. 4x4 and tegaderm appled to Rt groin. Post procedure instructions reviewed with patient. Patient to be transferred to 2W15 at this time.

## 2016-08-23 NOTE — Progress Notes (Signed)
Report and care transferred to [facility] by [place]. Patient resting comfortably at this time. No complaints. Rt groin site soft with no active bleeding or hematoma noted. Will continue to monitor patient.

## 2016-08-23 NOTE — H&P (View-Only) (Signed)
Referring Physician:  CLOIS MONTAVON is an 60 y.o. female.                       Chief Complaint: Chest pain and palpitation  HPI: 60 years old female has pressure type, substernal, non-radiating chest pain with abnormal cardiac enzymes. Her SVT responded to IV diltiazem when 3 doses (6, 12, 12 mg) of adenosine did not work. Currently she is in sinus rhythm.   Past Medical History:  Diagnosis Date  . Agoraphobia without history of panic disorder   . Anxiety   . Asthma   . Coronary artery disease CARDIOLOGIST- DR  Doylene Canard  (VISIT 03-30-11 W/ CHART)   NON-OBS. CAD   (STRESS TEST NOV. 2011  . DDD (degenerative disc disease)   . Depression   . DJD (degenerative joint disease)    JOINT PAIN  . Fibromyalgia   . Frequency of urination   . GERD (gastroesophageal reflux disease) AND HIATIAL HERNIA   CONTROLLED W/ NEXIUM  . Hemorrhoids   . History of gastric ulcer 2004  . Hypertension   . IBS (irritable bowel syndrome)   . Incisional pain s/p interstim implant 1st stage--- 12-07-11    left upper buttock-- pt states dressing clean dry and intact (on 12-08-11)  . Insomnia   . Iron deficiency anemia   . Nocturia   . Non-productive cough   . Numbness in both hands AT TIMES  . OSA on CPAP   . Urge urinary incontinence       Past Surgical History:  Procedure Laterality Date  . CARDIAC CATHETERIZATION  2003  . CERVICAL DISCECTOMY  2002   C4 - 7  . CERVICAL FUSION  2008   C4 - T1  . CHOLECYSTECTOMY  1982  . CYSTO/ HOD/ BLADDER BX  2006  . CYSTO/ HOD/ BLADDER BX/ FULGERATION  06-12-2011  . HYSTEROSCOPY W/D&C  2005  . INTERSTIM IMPLANT PLACEMENT  12/07/2011   Procedure: Barrie Lyme IMPLANT FIRST STAGE;  Surgeon: [name], MD;  Location: [facility];  Service: Urology;  Laterality: Right;  . INTERSTIM IMPLANT PLACEMENT  12/14/2011   Procedure: Barrie Lyme IMPLANT SECOND STAGE;  Surgeon: [name], MD;  Location: [facility];  Service:  Urology;  Laterality: Right;  rad tech ok by vickie at [facility]  . LAMINECTOMY  2007   L3 - 5  . RIGHT THUMB SURG.  2001  . TONSILLECTOMY AND ADENOIDECTOMY  1965  . WRIST SURGERY Left    [facility]    Family History  Problem Relation Age of Onset  . Hypertension Father   . Heart disease Father   . Kidney failure Father   . Breast cancer Mother   . Hypertension Mother   . Coronary artery disease Mother   . Alzheimer's disease Mother   . Hypertension Brother   . Hypertension Sister   . Hypertension Daughter    Social History:  reports that she has been smoking.  She has a 60.00 pack-year smoking history. She has never used smokeless tobacco. She reports that she does not drink alcohol or use drugs.  Allergies:  Allergies  Allergen Reactions  . Statins Other (See Comments)    Causes muscle weakness and joint pain  . Ace Inhibitors Other (See Comments)    DECREASES HEARTRATE  . Amoxicillin Diarrhea  . Midazolam Hcl     HYPER  . Nsaids     AVOIDS HX GASTRIC ULCER  . Prednisone Other (  See Comments)    HYPER  . Sulfa Antibiotics Rash and Other (See Comments)    JOINT PAIN    Medications Prior to Admission  Medication Sig Dispense Refill  . albuterol (PROVENTIL HFA;VENTOLIN HFA) 108 (90 BASE) MCG/ACT inhaler Inhale 2 puffs into the lungs every 6 (six) hours as needed for wheezing or shortness of breath.    Marland Kitchen amLODipine (NORVASC) 5 MG tablet Take 5 mg by mouth every morning.      . Ascorbic Acid (VITAMIN C) 1000 MG tablet Take 1,000 mg by mouth daily.    Marland Kitchen aspirin EC 325 MG tablet Take 325 mg by mouth every morning.     Marland Kitchen buPROPion (WELLBUTRIN XL) 150 MG 24 hr tablet Take 150 mg by mouth daily.     . cholecalciferol (VITAMIN D) 1000 UNITS tablet Take 1,000 Units by mouth every morning.     . clonazePAM (KLONOPIN) 1 MG tablet Take 1 mg by mouth 2 (two) times daily.  5  . DULoxetine (CYMBALTA) 60 MG capsule Take 60 mg by mouth at bedtime.     . fenofibrate (TRICOR) 48 MG tablet Take 48  mg by mouth every morning.   6  . ferrous sulfate 325 (65 FE) MG tablet Take 325 mg by mouth daily with breakfast.    . HYDROcodone-acetaminophen (NORCO) 5-325 MG per tablet Take 1 tablet by mouth 2 (two) times daily as needed for pain.     Marland Kitchen ibuprofen (ADVIL,MOTRIN) 200 MG tablet Take 600 mg by mouth every 6 (six) hours as needed for pain.    . isosorbide mononitrate (IMDUR) 60 MG 24 hr tablet Take 1 tablet (60 mg total) by mouth daily. 90 tablet 3  . meclizine (ANTIVERT) 12.5 MG tablet TAKE 1 TABLET 3 TIMES A DAY AS NEEDED FOR DIZZYNESS  0  . methocarbamol (ROBAXIN) 500 MG tablet Take 1 tablet (500 mg total) by mouth every 8 (eight) hours as needed (for muscle spasms). 45 tablet 3  . PRESCRIPTION MEDICATION Inner Stem therapy for overactive bladder implant on the right side.    . SUPER B COMPLEX/C PO Take 1 tablet by mouth every morning.    . traZODone (DESYREL) 50 MG tablet Take 50 mg by mouth at bedtime.        Results for orders placed or performed during the hospital encounter of 08/21/16 (from the past 48 hour(s))  Basic metabolic panel     Status: Abnormal   Collection Time: 08/21/16 11:25 PM  Result Value Ref Range   Sodium 138 135 - 145 mmol/L   Potassium 2.8 (L) 3.5 - 5.1 mmol/L   Chloride 107 101 - 111 mmol/L   CO2 25 22 - 32 mmol/L   Glucose, Bld 123 (H) 65 - 99 mg/dL   BUN 9 6 - 20 mg/dL   Creatinine, Ser 0.98 0.44 - 1.00 mg/dL   Calcium 9.5 8.9 - 10.3 mg/dL   GFR calc non Af Amer >60 >60 mL/min   GFR calc Af Amer >60 >60 mL/min    Comment: (NOTE) The eGFR has been calculated using the CKD EPI equation. This calculation has not been validated in all clinical situations. eGFR's persistently <60 mL/min signify possible Chronic Kidney Disease.    Anion gap 6 5 - 15  CBC     Status: Abnormal   Collection Time: 08/21/16 11:25 PM  Result Value Ref Range   WBC 15.4 (H) 4.0 - 10.5 K/uL   RBC 4.98 3.87 - 5.11 MIL/uL  Hemoglobin 15.2 (H) 12.0 - 15.0 g/dL   HCT 45.4 36.0 -  46.0 %   MCV 91.2 78.0 - 100.0 fL   MCH 30.5 26.0 - 34.0 pg   MCHC 33.5 30.0 - 36.0 g/dL   RDW 12.7 11.5 - 15.5 %   Platelets 296 150 - 400 K/uL  I-stat troponin, ED     Status: None   Collection Time: 08/21/16 11:33 PM  Result Value Ref Range   Troponin i, poc 0.00 0.00 - 0.08 ng/mL   Comment 3            Comment: Due to the release kinetics of cTnI, a negative result within the first hours of the onset of symptoms does not rule out myocardial infarction with certainty. If myocardial infarction is still suspected, repeat the test at appropriate intervals.   Magnesium     Status: None   Collection Time: 08/22/16  1:58 AM  Result Value Ref Range   Magnesium 2.0 1.7 - 2.4 mg/dL  Lipid panel     Status: Abnormal   Collection Time: 08/22/16  3:51 AM  Result Value Ref Range   Cholesterol 160 0 - 200 mg/dL   Triglycerides 131 <150 mg/dL   HDL 34 (L) >40 mg/dL   Total CHOL/HDL Ratio 4.7 RATIO   VLDL 26 0 - 40 mg/dL   LDL Cholesterol 100 (H) 0 - 99 mg/dL    Comment:        Total Cholesterol/HDL:CHD Risk Coronary Heart Disease Risk Table                     Men   Women  1/2 Average Risk   3.4   3.3  Average Risk       5.0   4.4  2 X Average Risk   9.6   7.1  3 X Average Risk  23.4   11.0        Use the calculated Patient Ratio above and the CHD Risk Table to determine the patient's CHD Risk.        ATP III CLASSIFICATION (LDL):  <100     mg/dL   Optimal  100-129  mg/dL   Near or Above                    Optimal  130-159  mg/dL   Borderline  160-189  mg/dL   High  >190     mg/dL   Very High   Troponin I (q 6hr x 3)     Status: Abnormal   Collection Time: 08/22/16  3:51 AM  Result Value Ref Range   Troponin I 0.05 (HH) <0.03 ng/mL    Comment: CRITICAL RESULT CALLED TO, READ BACK BY AND VERIFIED WITH: STRADLIN S,RN 08/22/16 0441 WAYK   TSH     Status: None   Collection Time: 08/22/16  3:51 AM  Result Value Ref Range   TSH 2.935 0.350 - 4.500 uIU/mL  CBC with  Differential/Platelet     Status: Abnormal   Collection Time: 08/22/16  3:51 AM  Result Value Ref Range   WBC 14.7 (H) 4.0 - 10.5 K/uL   RBC 4.39 3.87 - 5.11 MIL/uL   Hemoglobin 13.2 12.0 - 15.0 g/dL   HCT 39.7 36.0 - 46.0 %   MCV 90.4 78.0 - 100.0 fL   MCH 30.1 26.0 - 34.0 pg   MCHC 33.2 30.0 - 36.0 g/dL   RDW 12.7 11.5 - 15.5 %  Platelets 261 150 - 400 K/uL   Neutrophils Relative % 71 %   Lymphocytes Relative 20 %   Monocytes Relative 6 %   Eosinophils Relative 3 %   Basophils Relative 0 %   Neutro Abs 10.5 (H) 1.7 - 7.7 K/uL   Lymphs Abs 2.9 0.7 - 4.0 K/uL   Monocytes Absolute 0.9 0.1 - 1.0 K/uL   Eosinophils Absolute 0.4 0.0 - 0.7 K/uL   Basophils Absolute 0.0 0.0 - 0.1 K/uL   WBC Morphology ATYPICAL LYMPHOCYTES   Protime-INR     Status: None   Collection Time: 08/22/16  3:51 AM  Result Value Ref Range   Prothrombin Time 13.4 11.4 - 15.2 seconds   INR 1.02   Urine rapid drug screen (hosp performed)     Status: Abnormal   Collection Time: 08/22/16  8:55 AM  Result Value Ref Range   Opiates POSITIVE (A) NONE DETECTED   Cocaine NONE DETECTED NONE DETECTED   Benzodiazepines NONE DETECTED NONE DETECTED   Amphetamines NONE DETECTED NONE DETECTED   Tetrahydrocannabinol NONE DETECTED NONE DETECTED   Barbiturates NONE DETECTED NONE DETECTED    Comment:        DRUG SCREEN FOR MEDICAL PURPOSES ONLY.  IF CONFIRMATION IS NEEDED FOR ANY PURPOSE, NOTIFY LAB WITHIN 5 DAYS.        LOWEST DETECTABLE LIMITS FOR URINE DRUG SCREEN Drug Class       Cutoff (ng/mL) Amphetamine      1000 Barbiturate      200 Benzodiazepine   536 Tricyclics       644 Opiates          300 Cocaine          300 THC              50   Troponin I (q 6hr x 3)     Status: Abnormal   Collection Time: 08/22/16  9:09 AM  Result Value Ref Range   Troponin I 0.04 (HH) <0.03 ng/mL    Comment: CRITICAL VALUE NOTED.  VALUE IS CONSISTENT WITH PREVIOUSLY REPORTED AND CALLED VALUE.   Dg Chest 2 View  Result  Date: 08/21/2016 CLINICAL DATA:  Tachycardia and shortness of breath EXAM: CHEST  2 VIEW COMPARISON:  September 03, 2014 FINDINGS: The heart size and mediastinal contours are within normal limits. Both lungs are clear. The visualized skeletal structures are unremarkable. IMPRESSION: No active cardiopulmonary disease. Electronically Signed   By: Dorise Bullion III M.D   On: 08/21/2016 23:42    Review Of Systems Constitutional: No fever. Malaise Respiratory: Positive shortness of breath, bronchitis. Cardiology: Positive Chest pain Gastroenterology: Occasional nausea and diarrhea. No constipation. No GI bleed. Genitourinary: Positive cystitis.  Neurology: No stroke or seizures. Chronic headaches. Skin: No rash. Hematology: Chronic mild leukocytosis, No excessive bleeding. Immunology: No recurrent infection or lymphadenopathy.  Blood pressure 137/70, pulse 72, temperature 98.7 F (37.1 C), temperature source Oral, resp. rate 18, height 4' 11.5" (1.511 m), weight 66.5 kg (146 lb 11.2 oz), last menstrual period 09/06/2005, SpO2 100 %. General appearance: alert, cooperative, appears older than stated age and mild distress Head: Normocephalic, without obvious abnormality, atraumatic Eyes: conjunctivae/corneas clear. PERRL, EOM's intact. Neck: no adenopathy, no carotid bruit, no JVD, supple, symmetrical, trachea midline and thyroid not enlarged. Resp: clear to auscultation bilaterally Cardio: regular rate and rhythm, S1, S2 normal, II/VI systolic murmur, no click, rub or gallop GI: soft, non-tender; bowel sounds normal; no masses,  no organomegaly. Extremities: extremities normal,  atraumatic, no cyanosis or edema Skin: Skin color, texture, turgor normal. No rashes or lesions Neurologic: Alert and oriented X 3, normal strength and tone. Normal coordination and gait  Assessment/Plan Unstable angina CAD SVT Chronic bronchitis Chronic cystitis Hypertension  Agree with IV heparin Cardiac cath  in AM.   Birdie Riddle, MD  08/22/2016, 2:34 PM

## 2016-08-23 NOTE — Progress Notes (Signed)
PROGRESS NOTE    [name]  [id number] DOB: [birth year] DOA: 08/21/2016 PCP: [name], MD    Brief Narrative:  [name] Currieis a 60 y.o.femalewith medical history significant of CAD, hypertension, asthma, tobacco abuse, GERD, depression, anxiety, fibromyalgia, gastric ulcer, IBS, OSA on CPAP, chronic interstitial cystitis, chronic leukocytosis,who presents with palpitation and chest pain.   Patient reports that she had one episode of chest pain, which happened at about 2:30 PM. The chest pain was located in the substernal area, 4 out of 10 in severity, pressure-like pain, nonradiating. It lasted for about 10 minutes, then resolved spontaneously. It was not aggravated or alleviated by any known factors. Patient did not have shortness of breath, fever or chills. Patient states that she started having palpitations and heart racing at about 9:30 PM.Per EMS report, patient had SVT withheart rates of 150-160. EMS gave 6mg , 12mg , and 12mg  of adenosine without improvement. Pt also given 20mg  cardizem with decrease in pulse to 120-130. At arrival to ED, her HR is at 54s. Pt reports similar episode last week. Shetook a muscle relaxer and symptoms resolved.   Patient has mild nausea, but no vomiting, abdominal pain or diarrhea. She has mild cough due to smoking, which is at her baseline. She states that she has chronic interstitial cystitis, and always has urinary urgency, but no dysuria or burning on urination. Patient does not have unilateral weakness.  Patient seen by cardiology and patient for cardiac catheterization today 08/23/2016. Patient also seen by EP and resumed on metoprolol 12.5 mg twice a day.    Assessment & Plan:   Principal Problem:   Chest pain Active Problems:   Leukocytosis   Tobacco abuse   Hypertension   GERD (gastroesophageal reflux disease)   Depression   Coronary artery disease   Anxiety   SVT (supraventricular tachycardia) (HCC)    Hypokalemia   Chest pain, SVT and hx of [id number] pain has resolved. Patient has risk factors including hypertension, old age, tobacco abuse. She presented with SVT, with unclear etiology. Currently HR is at 60-80s. -Has a slightly elevated troponin, the trend does not necessarily suggest ACS, this is likely secondary to tachycardia. -Chest pain-free, no EKG changes. -Cardiology has evaluated the patient and recommending cardiac catheterization today. Per cardiology.   SVT -Reportedly heart rate was 150-160, received 3 doses of adenosine without help, received Cardizem. -Currently heart rate in the 60-80s. -TSH is normal, avoid caffeine, nicotine and electrolyte disturbances, magnesium is normal. Keep potassium >4.0. Patient has been restarted on metoprolol 12.5 mg twice daily per EP recommendations. Outpatient follow-up with EP.  Hypokalemia: K=2.8on admission. Mg 2.0 - Repleted with oral and parenteral supplements, potassium currently at 3.7.  Chronic leukocytosis:WBC 13.5 on 10/07/15--> 12.2 today from 15.4 on admission.Pt has been followed up by Dr. Julien Nordmann, last seen was on 10/11/15. Pt had bone Marrow aspirate biopsy on 09/15/15, which showed normal cellular bone marrow with trilinage hematopoiesis. Currently under observation. -f/u with CBC with differential -F/u with dr. Julien Nordmann  Tobacco abuse: -Did counseling about importance of quitting smoking -Nicotine patch  GERD: - Protonix prn  HTN: -continue amlodipine and metoprolol.  Depression and anxiety:Stable, no suicidal or homicidal ideations. -Continue home medications: Wellbutrin, Klonopin, Cymbalta  OSA: -CPAP    DVT prophylaxis: Lovenox Code Status: Full Family Communication: Updated patient. No family at bedside. Disposition Plan: Home pending cardiac catheterization results hopefully tomorrow.   Consultants:   Electrophysiology Dr. Rayann Heman 08/22/2016  Cardiology: [name]  08/22/2016  Procedures:  Chest x-ray 08/21/2016  Cardiac catheterization pending  Antimicrobials:   None   Subjective: Patient denies any palpitations. No chest pain. No shortness of breath.  Objective: Vitals:   08/22/16 1022 08/22/16 1332 08/22/16 1941 08/23/16 0319  BP: (!) 146/82 137/70 (!) 148/71 140/73  Pulse: 66 72 82 61  Resp: 18 18 19 18   Temp: 97.8 F (36.6 C) 98.7 F (37.1 C) 98 F (36.7 C) 98 F (36.7 C)  TempSrc: Oral Oral Oral Oral  SpO2: 100% 100% 97% 96%  Weight:      Height:        Intake/Output Summary (Last 24 hours) at 08/23/16 0909 Last data filed at 08/23/16 [id number]  Gross per 24 hour  Intake          2219.17 ml  Output              803 ml  Net          1416.17 ml   Filed Weights   08/21/16 2316 08/22/16 0411  Weight: 68 kg (150 lb) 66.5 kg (146 lb 11.2 oz)    Examination:  General exam: Appears calm and comfortable  Respiratory system: Clear to auscultation. Respiratory effort normal. Cardiovascular system: S1 & S2 heard, RRR. No JVD, murmurs, rubs, gallops or clicks. No pedal edema. Gastrointestinal system: Abdomen is nondistended, soft and nontender. No organomegaly or masses felt. Normal bowel sounds heard. Central nervous system: Alert and oriented. No focal neurological deficits. Extremities: Symmetric 5 x 5 power. Skin: No rashes, lesions or ulcers Psychiatry: Judgement and insight appear normal. Mood & affect appropriate.     Data Reviewed: I have personally reviewed following labs and imaging studies  CBC:  Recent Labs Lab 08/21/16 2325 08/22/16 0351 08/23/16 0339  WBC 15.4* 14.7* 12.2*  NEUTROABS  --  10.5*  --   HGB 15.2* 13.2 14.4  HCT 45.4 39.7 44.8  MCV 91.2 90.4 93.7  PLT 296 261 [id number]   Basic Metabolic Panel:  Recent Labs Lab 08/21/16 2325 08/22/16 0158 08/23/16 0339  NA 138  --  141  K 2.8*  --  3.7  CL 107  --  105  CO2 25  --  22  GLUCOSE 123*  --  104*  BUN 9  --  10  CREATININE 0.98  --  0.96   CALCIUM 9.5  --  8.9  MG  --  2.0  --    GFR: Estimated Creatinine Clearance: 51 mL/min (by C-G formula based on SCr of 0.96 mg/dL). Liver Function Tests: No results for input(s): AST, ALT, ALKPHOS, BILITOT, PROT, ALBUMIN in the last 168 hours. No results for input(s): LIPASE, AMYLASE in the last 168 hours. No results for input(s): AMMONIA in the last 168 hours. Coagulation Profile:  Recent Labs Lab 08/22/16 0351 08/23/16 0339  INR 1.02 1.00   Cardiac Enzymes:  Recent Labs Lab 08/22/16 0351 08/22/16 0909 08/22/16 1520  TROPONINI 0.05* 0.04* 0.03*   BNP (last 3 results) No results for input(s): PROBNP in the last 8760 hours. HbA1C:  Recent Labs  08/22/16 0351  HGBA1C 5.4   CBG: No results for input(s): GLUCAP in the last 168 hours. Lipid Profile:  Recent Labs  08/22/16 0351  CHOL 160  HDL 34*  LDLCALC 100*  TRIG 131  CHOLHDL 4.7   Thyroid Function Tests:  Recent Labs  08/22/16 0351  TSH 2.935   Anemia Panel: No results for input(s): VITAMINB12, FOLATE, FERRITIN, TIBC, IRON, RETICCTPCT in the last 72 hours.  Sepsis Labs: No results for input(s): PROCALCITON, LATICACIDVEN in the last 168 hours.  No results found for this or any previous visit (from the past 240 hour(s)).       Radiology Studies: Dg Chest 2 View  Result Date: 08/21/2016 CLINICAL DATA:  Tachycardia and shortness of breath EXAM: CHEST  2 VIEW COMPARISON:  September 03, 2014 FINDINGS: The heart size and mediastinal contours are within normal limits. Both lungs are clear. The visualized skeletal structures are unremarkable. IMPRESSION: No active cardiopulmonary disease. Electronically Signed   By: Dorise Bullion III M.D   On: 08/21/2016 23:42        Scheduled Meds: . amLODipine  5 mg Oral Daily  . aspirin EC  325 mg Oral q morning - 10a  . B-complex with vitamin C  1 tablet Oral Daily  . buPROPion  150 mg Oral Daily  . cholecalciferol  1,000 Units Oral q morning - 10a  .  clonazePAM  1 mg Oral BID  . DULoxetine  60 mg Oral QHS  . enoxaparin (LOVENOX) injection  40 mg Subcutaneous Daily  . fenofibrate  54 mg Oral Daily  . ferrous sulfate  325 mg Oral Q breakfast  . isosorbide mononitrate  60 mg Oral Daily  . metoprolol tartrate  12.5 mg Oral BID  . nicotine  21 mg Transdermal Daily  . sodium chloride flush  3 mL Intravenous Q12H  . traZODone  50 mg Oral QHS  . vitamin C  1,000 mg Oral Daily   Continuous Infusions: . sodium chloride 75 mL/hr at 08/22/16 0421  . sodium chloride 50 mL/hr at 08/22/16 1638     LOS: 0 days    Time spent: 73 minutes    Miski Feldpausch, MD [facility] Pager [phone]  If 7PM-7AM, please contact night-coverage [email or web address] Password [email or web address] 08/23/2016, 9:09 AM

## 2016-08-24 ENCOUNTER — Observation Stay (HOSPITAL_COMMUNITY): Payer: Medicare Other

## 2016-08-24 DIAGNOSIS — I10 Essential (primary) hypertension: Secondary | ICD-10-CM

## 2016-08-24 DIAGNOSIS — R079 Chest pain, unspecified: Secondary | ICD-10-CM | POA: Diagnosis not present

## 2016-08-24 DIAGNOSIS — K219 Gastro-esophageal reflux disease without esophagitis: Secondary | ICD-10-CM

## 2016-08-24 DIAGNOSIS — I371 Nonrheumatic pulmonary valve insufficiency: Secondary | ICD-10-CM | POA: Diagnosis not present

## 2016-08-24 DIAGNOSIS — I25110 Atherosclerotic heart disease of native coronary artery with unstable angina pectoris: Secondary | ICD-10-CM | POA: Diagnosis not present

## 2016-08-24 DIAGNOSIS — I425 Other restrictive cardiomyopathy: Secondary | ICD-10-CM | POA: Diagnosis not present

## 2016-08-24 DIAGNOSIS — F329 Major depressive disorder, single episode, unspecified: Secondary | ICD-10-CM | POA: Diagnosis not present

## 2016-08-24 LAB — BASIC METABOLIC PANEL
Anion gap: 6 (ref 5–15)
BUN: 10 mg/dL (ref 6–20)
CALCIUM: 9.7 mg/dL (ref 8.9–10.3)
CHLORIDE: 107 mmol/L (ref 101–111)
CO2: 30 mmol/L (ref 22–32)
CREATININE: 1.07 mg/dL — AB (ref 0.44–1.00)
GFR calc non Af Amer: 54 mL/min — ABNORMAL LOW (ref >60)
Glucose, Bld: 143 mg/dL — ABNORMAL HIGH (ref 65–99)
Potassium: 3.9 mmol/L (ref 3.5–5.1)
SODIUM: 143 mmol/L (ref 135–145)

## 2016-08-24 LAB — CBC
HCT: 39.9 % (ref 36.0–46.0)
Hemoglobin: 12.7 g/dL (ref 12.0–15.0)
MCH: 29.7 pg (ref 26.0–34.0)
MCHC: 31.8 g/dL (ref 30.0–36.0)
MCV: 93.4 fL (ref 78.0–100.0)
Platelets: 230 10*3/uL (ref 150–400)
RBC: 4.27 MIL/uL (ref 3.87–5.11)
RDW: 12.6 % (ref 11.5–15.5)
WBC: 11.8 10*3/uL — ABNORMAL HIGH (ref 4.0–10.5)

## 2016-08-24 LAB — ECHOCARDIOGRAM COMPLETE
HEIGHTINCHES: 59.5 in
WEIGHTICAEL: 2347.2 [oz_av]

## 2016-08-24 MED ORDER — METOPROLOL TARTRATE 25 MG PO TABS: 12.5000 mg | ORAL_TABLET | Freq: Two times a day (BID) | ORAL | 2 refills | Status: DC

## 2016-08-24 MED ORDER — NICOTINE 21 MG/24HR TD PT24: 21.0000 mg | MEDICATED_PATCH | Freq: Every day | TRANSDERMAL | 0 refills | Status: DC

## 2016-08-24 MED ORDER — PANTOPRAZOLE SODIUM 40 MG PO TBEC
40.0000 mg | DELAYED_RELEASE_TABLET | Freq: Every day | ORAL | 0 refills | Status: DC | PRN
Start: 2016-08-24 — End: 2020-02-20

## 2016-08-24 MED ORDER — NITROGLYCERIN 0.4 MG SL SUBL: 0.4000 mg | SUBLINGUAL_TABLET | SUBLINGUAL | 0 refills | Status: DC | PRN

## 2016-08-24 NOTE — Discharge Summary (Addendum)
Physician Discharge Summary  [name] [id number] DOB: February 06, 1956 DOA: 08/21/2016  PCP: [name], MD  Admit date: 08/21/2016 Discharge date: 08/24/2016  Time spent: 60 minutes  Recommendations for Outpatient Follow-up:  1. Follow-up with Dr.Kadakia, cardiology in 1 week. 2-D echo results will need to be followed up upon as it was pending at the time of discharge. 2. Follow-up with WEBB, CAROL D, MD a 1-2 weeks. On follow-up patient will likely need a basic metabolic profile done to follow-up on electrolytes and renal function. 3. Follow-up with Dr. Baird Kay, [place] 09/05/2016   Discharge Diagnoses:  Principal Problem:   Chest pain Active Problems:   Leukocytosis   Tobacco abuse   Hypertension   GERD (gastroesophageal reflux disease)   Depression   Coronary artery disease   Anxiety   SVT (supraventricular tachycardia) (HCC)   Hypokalemia   Discharge Condition: Stable and improved  Diet recommendation: Heart healthy  Filed Weights   08/21/16 2316 08/22/16 0411  Weight: 68 kg (150 lb) 66.5 kg (146 lb 11.2 oz)    History of present illness:  Per Dr Mallie Mussel is a 60 y.o. female with medical history significant of CAD, hypertension, asthma, tobacco abuse, GERD, depression, anxiety, fibromyalgia, gastric ulcer, IBS, OSA on CPAP, chronic interstitial cystitis, chronic leukocytosis, who presented with palpitation and chest pain.   Patient reported that she had one episode of chest pain, which happened at about 2:30 PM. The chest pain was located in the substernal area, 4 out of 10 in severity, pressure-like pain, nonradiating. It lasted for about 10 minutes, then resolved spontaneously. It was not aggravated or alleviated by any known factors. Patient did not have shortness of breath, fever or chills. Patient stated that she started having palpitations and heart racing at about 9:30 PM. Per EMS report, patient had SVT with heart rates of 150-160.  EMS gave 6mg , 12mg , and  12mg  of adenosine without improvement. Pt also given 20mg  cardizem with decrease in pulse to 120-130. At arrival to ED, her HR is at 76s. Pt reported similar episode last week. She took a muscle relaxer and symptoms resolved.   Patient has mild nausea, but no vomiting, abdominal pain or diarrhea. She has mild cough due to smoking, which is at her baseline. She stated that she has chronic interstitial cystitis, and always has urinary urgency, but no dysuria or burning on urination. Patient did not have unilateral weakness.  ED Course: pt was found to have WBC 15.4, negative troponin, temperature normal, heart rate 60s, O2 saturation 94% on room air, potassium 2.8, magnesium 2.0, negative chest x-ray for acute abnormalities. Patient is placed on telemetry bed for observation.   Hospital Course:  Chest pain, SVT and hx of [id number] pain has resolved. Patient has risk factors including hypertension, old age, tobacco abuse. She presented with SVT, with unclear etiology. Currently HR is at 60-80s. -Has a slightly elevated troponin, the trend does not necessarily suggest ACS, this is likely secondary to tachycardia. -Chest pain-free, no EKG changes. -Cardiology has evaluated the patient and recommended cardiac catheterization which patient underwent on 08/23/2016. Cardiac catheterization showed proximal RCA lesion 45% stenosis, ostial LAD to proximal LAD lesion 25% stenosed, mid LAD to distal LAD lesion 40% stenosis, distal LAD lesion 60%, 20% stenosed ostial first marginal lesion, 20% stenosed left ventricular systolic function normal J plan diastolic pressure is normal also second margin lesion 90%. Cardiology recommended medical management as OM 2 was relative small size vessel. Patient will follow-up  with cardiology one week post discharge.  SVT -Reportedly heart rate was 150-160 prior to admission per EMS report, received 3 doses of adenosine without help, received Cardizem. Heart rate improved and  remained in the 60s to 80s and occasionally up into the 120s as stated above. Cardiology was consulted and patient was seen by electrophysiology and patient started on metoprolol 12.5 mg twice daily per EP recommendations.  -TSH is normal, avoid caffeine, nicotine and electrolyte disturbances, magnesium is normal. Keep potassium >4.0.  Patient's heart rate improved and no longer had any sustained SVTs. .Outpatient follow-up with EP.  Hypokalemia: K=2.8on admission. Mg 2.0 - Repletedwith oral and parenteral supplements, potassium currently at 3.7.  Chronic leukocytosis:WBC 11.8 from 13.5 on 10/07/15--> 12.2 from 15.4 on admission.Pt has been followed up by Dr. Julien Nordmann, last seen was on 10/11/15. Pt had bone Marrow aspirate biopsy on 09/15/15, which showed normal cellular bone marrow with trilinage hematopoiesis. Currently under observation. -f/u with CBC with differential -F/u with dr. Julien Nordmann  Tobacco abuse: -Did counseling about importance of quitting smoking -Nicotine patch placed.  GERD: - Protonix prn  HTN: -continued on amlodipine and metoprolol.  Depression and anxiety:Stable, no suicidal or homicidal ideations. -Continued on home medications: Wellbutrin, Klonopin, Cymbalta  OSA: -CPAP    Procedures:  Chest x-ray 08/21/2016  Cardiac catheterization 08/23/2016 by Dr.Kadakia  2-D echo 08/24/2016 results pending  Consultations:  Electrophysiology Dr. Rayann Heman 08/22/2016  Cardiology: [name] 08/22/2016   Discharge Exam: Vitals:   08/24/16 1200 08/24/16 1318  BP:  (!) 110/59  Pulse: 75 (!) 55  Resp:  19  Temp:  98.3 F (36.8 C)    General: NAD Cardiovascular: RRR Respiratory: CTAB  Discharge Instructions   Discharge Instructions    Diet - low sodium heart healthy    Complete by:  As directed   Discharge instructions    Complete by:  As directed   No caffeine, no nicotine.   Increase activity slowly    Complete by:  As directed      Current Discharge Medication List    START taking these medications   Details  metoprolol tartrate (LOPRESSOR) 25 MG tablet Take 0.5 tablets (12.5 mg total) by mouth 2 (two) times daily. Qty: 60 tablet, Refills: 2    nicotine (NICODERM CQ - DOSED IN MG/24 HOURS) 21 mg/24hr patch Place 1 patch (21 mg total) onto the skin daily. Qty: 28 patch, Refills: 0    nitroGLYCERIN (NITROSTAT) 0.4 MG SL tablet Place 1 tablet (0.4 mg total) under the tongue every 5 (five) minutes x 3 doses as needed for chest pain. Qty: 10 tablet, Refills: 0    pantoprazole (PROTONIX) 40 MG tablet Take 1 tablet (40 mg total) by mouth daily as needed. Qty: 20 tablet, Refills: 0      CONTINUE these medications which have NOT CHANGED   Details  albuterol (PROVENTIL HFA;VENTOLIN HFA) 108 (90 BASE) MCG/ACT inhaler Inhale 2 puffs into the lungs every 6 (six) hours as needed for wheezing or shortness of breath.    amLODipine (NORVASC) 5 MG tablet Take 5 mg by mouth every morning.      Ascorbic Acid (VITAMIN C) 1000 MG tablet Take 1,000 mg by mouth daily.    aspirin EC 325 MG tablet Take 325 mg by mouth every morning.     buPROPion (WELLBUTRIN XL) 150 MG 24 hr tablet Take 150 mg by mouth daily.     cholecalciferol (VITAMIN D) 1000 UNITS tablet Take 1,000 Units by mouth every morning.  clonazePAM (KLONOPIN) 1 MG tablet Take 1 mg by mouth 2 (two) times daily. Refills: 5   Associated Diagnoses: Leukocytosis    DULoxetine (CYMBALTA) 60 MG capsule Take 60 mg by mouth at bedtime.     fenofibrate (TRICOR) 48 MG tablet Take 48 mg by mouth every morning.  Refills: 6   Associated Diagnoses: Leukocytosis    ferrous sulfate 325 (65 FE) MG tablet Take 325 mg by mouth daily with breakfast.    HYDROcodone-acetaminophen (NORCO) 5-325 MG per tablet Take 1 tablet by mouth 2 (two) times daily as needed for pain.     ibuprofen (ADVIL,MOTRIN) 200 MG tablet Take 600 mg by mouth every 6 (six) hours as needed for pain.     isosorbide mononitrate (IMDUR) 60 MG 24 hr tablet Take 1 tablet (60 mg total) by mouth daily. Qty: 90 tablet, Refills: 3    meclizine (ANTIVERT) 12.5 MG tablet TAKE 1 TABLET 3 TIMES A DAY AS NEEDED FOR DIZZYNESS Refills: 0    methocarbamol (ROBAXIN) 500 MG tablet Take 1 tablet (500 mg total) by mouth every 8 (eight) hours as needed (for muscle spasms). Qty: 45 tablet, Refills: 3    PRESCRIPTION MEDICATION [facility] Stem therapy for overactive bladder implant on the right side.    SUPER B COMPLEX/C PO Take 1 tablet by mouth every morning.    traZODone (DESYREL) 50 MG tablet Take 50 mg by mouth at bedtime.         Allergies  Allergen Reactions  . Statins Other (See Comments)    Causes muscle weakness and joint pain  . Ace Inhibitors Other (See Comments)    DECREASES HEARTRATE  . Amoxicillin Diarrhea  . Midazolam Hcl     HYPER  . Nsaids     AVOIDS HX GASTRIC ULCER  . Prednisone Other (See Comments)    HYPER  . Sulfa Antibiotics Rash and Other (See Comments)    JOINT PAIN   Follow-up Information    Will Meredith Leeds, MD Follow up on 09/05/2016.   Specialty:  Cardiology Why:  at 9:45AM Contact information: [address] [place] [address] [phone]        Birdie Riddle, MD. Schedule an appointment as soon as possible for a visit in 1 week(s).   Specialty:  Cardiology Contact information: [place] [address] [phone]        [name], MD. Schedule an appointment as soon as possible for a visit in 2 week(s).   Specialty:  Family Medicine Contact information: [place] [address] [phone]            The results of significant diagnostics from this hospitalization (including imaging, microbiology, ancillary and laboratory) are listed below for reference.    Significant Diagnostic Studies: Dg Chest 2 View  Result Date: 08/21/2016 CLINICAL DATA:  Tachycardia and shortness  of breath EXAM: CHEST  2 VIEW COMPARISON:  September 03, 2014 FINDINGS: The heart size and mediastinal contours are within normal limits. Both lungs are clear. The visualized skeletal structures are unremarkable. IMPRESSION: No active cardiopulmonary disease. Electronically Signed   By: Dorise Bullion III M.D   On: 08/21/2016 23:42    Microbiology: No results found for this or any previous visit (from the past 240 hour(s)).   Labs: Basic Metabolic Panel:  Recent Labs Lab 08/21/16 2325 08/22/16 0158 08/23/16 0339 08/24/16 1020  NA 138  --  141 143  K 2.8*  --  3.7 3.9  CL 107  --  105 107  CO2 25  --  22 30  GLUCOSE 123*  --  104* 143*  BUN 9  --  10 10  CREATININE 0.98  --  0.96 1.07*  CALCIUM 9.5  --  8.9 9.7  MG  --  2.0  --   --    Liver Function Tests: No results for input(s): AST, ALT, ALKPHOS, BILITOT, PROT, ALBUMIN in the last 168 hours. No results for input(s): LIPASE, AMYLASE in the last 168 hours. No results for input(s): AMMONIA in the last 168 hours. CBC:  Recent Labs Lab 08/21/16 2325 08/22/16 0351 08/23/16 0339 08/24/16 0205  WBC 15.4* 14.7* 12.2* 11.8*  NEUTROABS  --  10.5*  --   --   HGB 15.2* 13.2 14.4 12.7  HCT 45.4 39.7 44.8 39.9  MCV 91.2 90.4 93.7 93.4  PLT 296 261 245 230   Cardiac Enzymes:  Recent Labs Lab 08/22/16 0351 08/22/16 0909 08/22/16 1520  TROPONINI 0.05* 0.04* 0.03*   BNP: BNP (last 3 results) No results for input(s): BNP in the last 8760 hours.  ProBNP (last 3 results) No results for input(s): PROBNP in the last 8760 hours.  CBG: No results for input(s): GLUCAP in the last 168 hours.     SignedIrine Seal MD.  [facility] 08/24/2016, 6:39 PM

## 2016-08-24 NOTE — Progress Notes (Signed)
When patient is awake H.R. In the 60's When asleep H.R. S.B. 40 cont. To monitor patient and rhythm

## 2016-08-24 NOTE — Progress Notes (Signed)
  Echocardiogram 2D Echocardiogram has been performed.  Bobbye Charleston 08/24/2016, 2:47 PM

## 2016-08-27 ENCOUNTER — Encounter (HOSPITAL_COMMUNITY): Payer: Self-pay

## 2016-08-27 ENCOUNTER — Emergency Department (HOSPITAL_COMMUNITY)
Admission: EM | Admit: 2016-08-27 | Discharge: 2016-08-27 | Disposition: A | Discharge status: Home / Self Care | Payer: Medicare Other | Attending: Emergency Medicine | Admitting: Emergency Medicine

## 2016-08-27 DIAGNOSIS — F17200 Nicotine dependence, unspecified, uncomplicated: Secondary | ICD-10-CM | POA: Diagnosis not present

## 2016-08-27 DIAGNOSIS — Z7982 Long term (current) use of aspirin: Secondary | ICD-10-CM | POA: Diagnosis not present

## 2016-08-27 DIAGNOSIS — R110 Nausea: Secondary | ICD-10-CM | POA: Diagnosis not present

## 2016-08-27 DIAGNOSIS — R197 Diarrhea, unspecified: Secondary | ICD-10-CM | POA: Insufficient documentation

## 2016-08-27 DIAGNOSIS — I10 Essential (primary) hypertension: Secondary | ICD-10-CM | POA: Insufficient documentation

## 2016-08-27 DIAGNOSIS — I2510 Atherosclerotic heart disease of native coronary artery without angina pectoris: Secondary | ICD-10-CM | POA: Insufficient documentation

## 2016-08-27 DIAGNOSIS — J45909 Unspecified asthma, uncomplicated: Secondary | ICD-10-CM | POA: Insufficient documentation

## 2016-08-27 LAB — I-STAT TROPONIN, ED
TROPONIN I, POC: 0 ng/mL (ref 0.00–0.08)
Troponin i, poc: 0 ng/mL (ref 0.00–0.08)

## 2016-08-27 LAB — BASIC METABOLIC PANEL
ANION GAP: 8 (ref 5–15)
BUN: 21 mg/dL — ABNORMAL HIGH (ref 6–20)
CHLORIDE: 102 mmol/L (ref 101–111)
CO2: 26 mmol/L (ref 22–32)
Calcium: 10.3 mg/dL (ref 8.9–10.3)
Creatinine, Ser: 1.16 mg/dL — ABNORMAL HIGH (ref 0.44–1.00)
GFR calc Af Amer: 57 mL/min — ABNORMAL LOW (ref >60)
GFR calc non Af Amer: 49 mL/min — ABNORMAL LOW (ref >60)
GLUCOSE: 105 mg/dL — AB (ref 65–99)
POTASSIUM: 4.6 mmol/L (ref 3.5–5.1)
Sodium: 136 mmol/L (ref 135–145)

## 2016-08-27 LAB — DIFFERENTIAL
BASOS ABS: 0.1 10*3/uL (ref 0.0–0.1)
BASOS PCT: 1 %
Eosinophils Absolute: 0.3 10*3/uL (ref 0.0–0.7)
Eosinophils Relative: 2 %
Lymphocytes Relative: 16 %
Lymphs Abs: 2.5 10*3/uL (ref 0.7–4.0)
MONOS PCT: 6 %
Monocytes Absolute: 0.9 10*3/uL (ref 0.1–1.0)
NEUTROS ABS: 11.8 10*3/uL — AB (ref 1.7–7.7)
Neutrophils Relative %: 75 %

## 2016-08-27 LAB — CBC
HEMATOCRIT: 46 % (ref 36.0–46.0)
HEMOGLOBIN: 15.3 g/dL — AB (ref 12.0–15.0)
MCH: 30.2 pg (ref 26.0–34.0)
MCHC: 33.3 g/dL (ref 30.0–36.0)
MCV: 90.9 fL (ref 78.0–100.0)
Platelets: 302 10*3/uL (ref 150–400)
RBC: 5.06 MIL/uL (ref 3.87–5.11)
RDW: 12.5 % (ref 11.5–15.5)
WBC: 15.5 10*3/uL — ABNORMAL HIGH (ref 4.0–10.5)

## 2016-08-27 LAB — URINALYSIS, ROUTINE W REFLEX MICROSCOPIC
Bilirubin Urine: NEGATIVE
Glucose, UA: NEGATIVE mg/dL
Hgb urine dipstick: NEGATIVE
Ketones, ur: NEGATIVE mg/dL
NITRITE: NEGATIVE
PH: 5 (ref 5.0–8.0)
Protein, ur: NEGATIVE mg/dL
Specific Gravity, Urine: 1.02 (ref 1.005–1.030)

## 2016-08-27 LAB — URINE MICROSCOPIC-ADD ON: BACTERIA UA: NONE SEEN

## 2016-08-27 LAB — I-STAT CG4 LACTIC ACID, ED
LACTIC ACID, VENOUS: 1.14 mmol/L (ref 0.5–1.9)
Lactic Acid, Venous: 0.73 mmol/L (ref 0.5–1.9)

## 2016-08-27 LAB — MAGNESIUM: Magnesium: 2.2 mg/dL (ref 1.7–2.4)

## 2016-08-27 MED ORDER — ONDANSETRON HCL 4 MG PO TABS: 4.0000 mg | ORAL_TABLET | Freq: Four times a day (QID) | ORAL | 0 refills | Status: DC | PRN

## 2016-08-27 MED ORDER — SODIUM CHLORIDE 0.9 % IV BOLUS (SEPSIS)
1000.0000 mL | Freq: Once | INTRAVENOUS | Status: AC
  Administered 2016-08-27: 1000 mL via INTRAVENOUS

## 2016-08-27 MED ORDER — LOPERAMIDE HCL 2 MG PO CAPS: 2.0000 mg | ORAL_CAPSULE | Freq: Four times a day (QID) | ORAL | 0 refills | Status: DC | PRN

## 2016-08-27 MED ORDER — LOPERAMIDE HCL 2 MG PO CAPS
4.0000 mg | ORAL_CAPSULE | Freq: Once | ORAL | Status: AC
  Administered 2016-08-27: 4 mg via ORAL
  Filled 2016-08-27: qty 2

## 2016-08-27 NOTE — ED Notes (Signed)
Pt ambulated to bathroom without difficulty.  NAD.  Pt states she is not as dizzy now as she was on admission to ED.

## 2016-08-27 NOTE — ED Notes (Signed)
Pt reports tightness along the left side of her throat extending somewhat into her chest

## 2016-08-27 NOTE — ED Provider Notes (Signed)
[facility] DEPT Provider Note   CSN: [id number] Arrival date & time: 08/27/16  1617     History   Chief Complaint Chief Complaint  Patient presents with  . Diarrhea  . Nausea    HPI [name] is a 60 y.o. female.  HPI  60 year old with history of CAD, IBS, interstitial cystitis and HTN who presents with lightheadedness. Recently admitted last week for SVT and chest pain rule out. Had [place] with mild obstructive disease, that cardiology wanted to medically manage. Was discharged 08/24/2016. Thinks she may have overdone herself, by running errands.   She has been feeling intermittently lightheaded and nauseous since then. States she has 3 episodes of diarrhea whenever she eats. No blood in diarrhea. No fever or vomiting.  Feeling weak and tired since discharge home. Today, feeling slightly worse. She talked to [name] who recommended coming to ED for concern for dehydration. No recent antibiotics.   No chest pain, syncope, dyspnea, DOE, orthopnea or PND.  Past Medical History:  Diagnosis Date  . Agoraphobia without history of panic disorder   . Anxiety   . Asthma   . Coronary artery disease CARDIOLOGIST- DR  Doylene Canard  (VISIT 03-30-11 W/ CHART)   NON-OBS. CAD   (STRESS TEST NOV. 2011  . DDD (degenerative disc disease)   . Depression   . DJD (degenerative joint disease)    JOINT PAIN  . Fibromyalgia   . Frequency of urination   . GERD (gastroesophageal reflux disease) AND HIATIAL HERNIA   CONTROLLED W/ NEXIUM  . Hemorrhoids   . History of gastric ulcer 2004  . Hypertension   . IBS (irritable bowel syndrome)   . Incisional pain s/p interstim implant 1st stage--- 12-07-11    left upper buttock-- pt states dressing clean dry and intact (on 12-08-11)  . Insomnia   . Iron deficiency anemia   . Nocturia   . Non-productive cough   . Numbness in both hands AT TIMES  . OSA on CPAP   . Urge urinary incontinence     Patient Active Problem List   Diagnosis Date Noted    . Chest pain 08/22/2016  . SVT (supraventricular tachycardia) ([facility]) 08/22/2016  . OSA on CPAP   . Hypertension   . GERD (gastroesophageal reflux disease)   . Depression   . Coronary artery disease   . Anxiety   . Pain in the chest   . Hypokalemia   . Carpal tunnel syndrome, bilateral 02/18/2016  . Leukocytosis 05/26/2014  . Tobacco abuse 05/26/2014  . Urge incontinence 12/14/2011    Past Surgical History:  Procedure Laterality Date  . CARDIAC CATHETERIZATION  2003  . CARDIAC CATHETERIZATION N/A 08/23/2016   Procedure: Left Heart Cath and Coronary Angiography;  Surgeon: Dixie Dials, MD;  Location: [facility];  Service: Cardiovascular;  Laterality: N/A;  . CERVICAL DISCECTOMY  2002   C4 - 7  . CERVICAL FUSION  2008   C4 - T1  . CHOLECYSTECTOMY  1982  . CYSTO/ HOD/ BLADDER BX  2006  . CYSTO/ HOD/ BLADDER BX/ FULGERATION  06-12-2011  . HYSTEROSCOPY W/D&C  2005  . INTERSTIM IMPLANT PLACEMENT  12/07/2011   Procedure: Barrie Lyme IMPLANT FIRST STAGE;  Surgeon: [name], MD;  Location: [facility];  Service: Urology;  Laterality: Right;  . INTERSTIM IMPLANT PLACEMENT  12/14/2011   Procedure: Barrie Lyme IMPLANT SECOND STAGE;  Surgeon: [name], MD;  Location: [facility];  Service: Urology;  Laterality: Right;  rad tech ok by vickie at [facility]  . LAMINECTOMY  2007   L3 - 5  . RIGHT THUMB SURG.  2001  . TONSILLECTOMY AND ADENOIDECTOMY  1965  . WRIST SURGERY Left    [facility]    OB History    No data available       Home Medications    Prior to Admission medications   Medication Sig Start Date End Date Taking? Authorizing Provider  albuterol (PROVENTIL HFA;VENTOLIN HFA) 108 (90 BASE) MCG/ACT inhaler Inhale 2 puffs into the lungs every 6 (six) hours as needed for wheezing or shortness of breath.   Yes Historical Provider, MD  amLODipine (NORVASC) 5 MG tablet Take 5 mg by mouth every morning.     Yes Historical Provider, MD   Ascorbic Acid (VITAMIN C) 1000 MG tablet Take 1,000 mg by mouth daily.   Yes Historical Provider, MD  aspirin EC 325 MG tablet Take 325 mg by mouth every morning.    Yes Historical Provider, MD  buPROPion (WELLBUTRIN XL) 150 MG 24 hr tablet Take 150 mg by mouth daily.    Yes Historical Provider, MD  cholecalciferol (VITAMIN D) 1000 UNITS tablet Take 1,000 Units by mouth every morning.    Yes Historical Provider, MD  clonazePAM (KLONOPIN) 1 MG tablet Take 1 mg by mouth 2 (two) times daily. 05/10/15  Yes Historical Provider, MD  DULoxetine (CYMBALTA) 60 MG capsule Take 60 mg by mouth at bedtime.    Yes Historical Provider, MD  fenofibrate (TRICOR) 48 MG tablet Take 48 mg by mouth every morning.  04/30/15  Yes Historical Provider, MD  ferrous sulfate 325 (65 FE) MG tablet Take 325 mg by mouth daily with breakfast.   Yes Historical Provider, MD  HYDROcodone-acetaminophen (NORCO) 5-325 MG per tablet Take 1 tablet by mouth 2 (two) times daily as needed for pain.    Yes Historical Provider, MD  ibuprofen (ADVIL,MOTRIN) 200 MG tablet Take 600 mg by mouth every 6 (six) hours as needed for pain.   Yes Historical Provider, MD  isosorbide mononitrate (IMDUR) 60 MG 24 hr tablet Take 1 tablet (60 mg total) by mouth daily. 04/12/16  Yes Will Meredith Leeds, MD  meclizine (ANTIVERT) 12.5 MG tablet TAKE 1 TABLET 3 TIMES A DAY AS NEEDED FOR DIZZYNESS 01/12/16  Yes Historical Provider, MD  methocarbamol (ROBAXIN) 500 MG tablet Take 1 tablet (500 mg total) by mouth every 8 (eight) hours as needed (for muscle spasms). 02/18/16  Yes Donika K Patel, DO  metoprolol tartrate (LOPRESSOR) 25 MG tablet Take 0.5 tablets (12.5 mg total) by mouth 2 (two) times daily. 08/24/16  Yes Eugenie Filler, MD  nicotine (NICODERM CQ - DOSED IN MG/24 HOURS) 21 mg/24hr patch Place 1 patch (21 mg total) onto the skin daily. 08/24/16  Yes Eugenie Filler, MD  pantoprazole (PROTONIX) 40 MG tablet Take 1 tablet (40 mg total) by mouth daily as needed.  08/24/16  Yes Eugenie Filler, MD  [place] Stem therapy for overactive bladder implant on the right side.   Yes Historical Provider, MD  SUPER B COMPLEX/C PO Take 1 tablet by mouth every morning.   Yes Historical Provider, MD  traZODone (DESYREL) 50 MG tablet Take 50 mg by mouth at bedtime.     Yes Historical Provider, MD  loperamide (IMODIUM) 2 MG capsule Take 1 capsule (2 mg total) by mouth 4 (four) times daily as needed for diarrhea or loose stools. 08/27/16   Hinton Dyer  Roderic Ovens, MD  nitroGLYCERIN (NITROSTAT) 0.4 MG SL tablet Place 1 tablet (0.4 mg total) under the tongue every 5 (five) minutes x 3 doses as needed for chest pain. 08/24/16   Eugenie Filler, MD  ondansetron (ZOFRAN) 4 MG tablet Take 1 tablet (4 mg total) by mouth every 6 (six) hours as needed for nausea or vomiting. 08/27/16   Forde Dandy, MD    Family History Family History  Problem Relation Age of Onset  . Hypertension Father   . Heart disease Father   . Kidney failure Father   . Breast cancer Mother   . Hypertension Mother   . Coronary artery disease Mother   . Alzheimer's disease Mother   . Hypertension Brother   . Hypertension Sister   . Hypertension Daughter     Social History Social History  Substance Use Topics  . Smoking status: Current Every Day Smoker    Packs/day: 2.00    Years: 30.00  . Smokeless tobacco: Never Used  . Alcohol use No     Comment: OCCASIONAL     Allergies   Statins; Ace inhibitors; Amoxicillin; Midazolam hcl; Nsaids; Prednisone; and Sulfa antibiotics   Review of Systems Review of Systems 10/14 systems reviewed and are negative other than those stated in the HPI   Physical Exam Updated Vital Signs BP 151/66 (BP Location: Left Arm)   Pulse 68   Temp 97.9 F (36.6 C) (Oral)   Resp 16   LMP 09/06/2005   SpO2 95%   Physical Exam Physical Exam  Nursing note and vitals reviewed. Constitutional: Appears low energy,  non-toxic, and in no acute  distress Head: Normocephalic and atraumatic.  Mouth/Throat: Oropharynx is clear and dry mucous membranes.  Neck: Normal range of motion. Neck supple.  Cardiovascular: Normal rate and regular rhythm.  No edema. Pulmonary/Chest: Effort normal and breath sounds normal.  Abdominal: Soft. There is no tenderness. There is no rebound and no guarding.  Musculoskeletal: Normal range of motion.  Neurological: Alert, no facial droop, fluent speech, moves all extremities symmetrically Skin: Skin is warm and dry.  Psychiatric: Cooperative   ED Treatments / Results  Labs (all labs ordered are listed, but only abnormal results are displayed) Labs Reviewed  BASIC METABOLIC PANEL - Abnormal; Notable for the following:       Result Value   Glucose, Bld 105 (*)    BUN 21 (*)    Creatinine, Ser 1.16 (*)    GFR calc non Af Amer 49 (*)    GFR calc Af Amer 57 (*)    All other components within normal limits  CBC - Abnormal; Notable for the following:    WBC 15.5 (*)    Hemoglobin 15.3 (*)    All other components within normal limits  DIFFERENTIAL - Abnormal; Notable for the following:    Neutro Abs 11.8 (*)    All other components within normal limits  URINALYSIS, ROUTINE W REFLEX MICROSCOPIC (NOT AT [facility]) - Abnormal; Notable for the following:    Leukocytes, UA SMALL (*)    All other components within normal limits  URINE MICROSCOPIC-ADD ON - Abnormal; Notable for the following:    Squamous Epithelial / LPF 0-5 (*)    All other components within normal limits  MAGNESIUM  I-STAT CG4 LACTIC ACID, ED  Randolm Idol, ED  I-STAT CG4 LACTIC ACID, ED  Randolm Idol, ED    EKG  EKG Interpretation  Date/Time:  Sunday August 27 2016 16:27:25 EDT Ventricular  Rate:  51 PR Interval:  182 QRS Duration: 88 QT Interval:  454 QTC Calculation: 418 R Axis:   83 Text Interpretation:  Sinus bradycardia with sinus arrhythmia Cannot rule out Anterior infarct , age undetermined Abnormal ECG no  acute changes  Confirmed by Viney Acocella MD, Trevonn Hallum [phone]) on 08/27/2016 8:30:56 PM       Radiology No results found.  Procedures Procedures (including critical care time)  Medications Ordered in ED Medications  sodium chloride 0.9 % bolus 1,000 mL (0 mLs Intravenous Stopped 08/27/16 2243)  sodium chloride 0.9 % bolus 1,000 mL (0 mLs Intravenous Stopped 08/27/16 2243)  loperamide (IMODIUM) capsule 4 mg (4 mg Oral Given 08/27/16 2141)     Initial Impression / Assessment and Plan / ED Course  I have reviewed the triage vital signs and the nursing notes.  Pertinent labs & imaging results that were available during my care of the patient were reviewed by me and considered in my medical decision making (see chart for details).  Clinical Course   60 year old female who presents with lightheadedness in the setting of diarrhea. She is hemodynamically stable but does appear dry on exam. Was soft and nontender abdomen and unremarkable cardiopulmonary exam. Suspect that she has dehydration due to volume loss from diarrhea, which is consistent with that of her IBS symptoms. No major electrolyte or metabolic derangements on her blood work. Do not feel that this is an atypical presentation for ACS, and her troponin and EKGs are unremarkable. She is given IV fluids, with improved symptoms. She is able to ambulate steadily halfway into receiving her fluids. She is given antidiarrheal anti-emetics. I feel that she is stable for discharge home for continued supportive care management. She will follow-up closely with her primary care provider. Strict return and follow-up instructions are reviewed. She expressed understanding of all discharge instructions, and felt comfortable to plan of care.  Final Clinical Impressions(s) / ED Diagnoses   Final diagnoses:  Diarrhea, unspecified type  Nausea    New Prescriptions New Prescriptions   LOPERAMIDE (IMODIUM) 2 MG CAPSULE    Take 1 capsule (2 mg total) by mouth 4  (four) times daily as needed for diarrhea or loose stools.   ONDANSETRON (ZOFRAN) 4 MG TABLET    Take 1 tablet (4 mg total) by mouth every 6 (six) hours as needed for nausea or vomiting.     Forde Dandy, MD 08/27/16 [phone]

## 2016-08-27 NOTE — ED Triage Notes (Signed)
Patient complains of ongoing fatigue, diarrhea, dizziness since hospitalization last week. Denies pain, alert and oriented, pale on arrival. States she was admitted for SVT, atrial fib. Today chills with same

## 2016-08-27 NOTE — Discharge Instructions (Signed)
Please take antidiarrheal and nausea medications as prescribed. Continue to drink plenty of fluids and get plenty of rest. Follow-up with your primary care doctor closely for reevaluation this week. Return without fail for worsening symptoms including intractable vomiting, passing out, fever, severe abdominal pain, or any other symptoms concerning to you.

## 2016-08-27 NOTE — ED Notes (Signed)
Pt reports she was here in the ED Monday night and stayed until thursday for a high heartrate. Reports they found blockages. Reports she was having diarrhea and lightheadedness and fatigue since being discharged Thursday. Reports running around doing many errands after being discharged, feeling she may have overrun herself.

## 2016-08-30 DIAGNOSIS — I10 Essential (primary) hypertension: Secondary | ICD-10-CM | POA: Diagnosis not present

## 2016-08-30 DIAGNOSIS — J4520 Mild intermittent asthma, uncomplicated: Secondary | ICD-10-CM | POA: Diagnosis not present

## 2016-08-30 DIAGNOSIS — F17210 Nicotine dependence, cigarettes, uncomplicated: Secondary | ICD-10-CM | POA: Diagnosis not present

## 2016-08-30 DIAGNOSIS — K580 Irritable bowel syndrome with diarrhea: Secondary | ICD-10-CM | POA: Diagnosis not present

## 2016-09-01 DIAGNOSIS — Z23 Encounter for immunization: Secondary | ICD-10-CM | POA: Diagnosis not present

## 2016-09-01 DIAGNOSIS — Z09 Encounter for follow-up examination after completed treatment for conditions other than malignant neoplasm: Secondary | ICD-10-CM | POA: Diagnosis not present

## 2016-09-01 DIAGNOSIS — I10 Essential (primary) hypertension: Secondary | ICD-10-CM | POA: Diagnosis not present

## 2016-09-04 ENCOUNTER — Encounter: Payer: Self-pay | Admitting: Cardiology

## 2016-09-05 ENCOUNTER — Ambulatory Visit (INDEPENDENT_AMBULATORY_CARE_PROVIDER_SITE_OTHER): Payer: Medicare Other | Admitting: Cardiology

## 2016-09-05 ENCOUNTER — Encounter: Payer: Self-pay | Admitting: Cardiology

## 2016-09-05 VITALS — BP 118/66 | HR 66 | Ht 59.5 in | Wt 145.0 lb

## 2016-09-05 DIAGNOSIS — R002 Palpitations: Secondary | ICD-10-CM | POA: Diagnosis not present

## 2016-09-05 DIAGNOSIS — I2510 Atherosclerotic heart disease of native coronary artery without angina pectoris: Secondary | ICD-10-CM

## 2016-09-05 NOTE — Patient Instructions (Signed)
Medication Instructions:  Your physician recommends that you continue on your current medications as directed. Please refer to the Current Medication list given to you today.  Labwork: None rodered  Testing/Procedures: Your physician has recommended that you wear a 48 hour holter monitor. Holter monitors are medical devices that record the heart's electrical activity. Doctors most often use these monitors to diagnose arrhythmias. Arrhythmias are problems with the speed or rhythm of the heartbeat. The monitor is a small, portable device. You can wear one while you do your normal daily activities. This is usually used to diagnose what is causing palpitations/syncope (passing out).  Follow-Up: Your physician wants you to follow-up in: 6 months with Dr. Curt Bears. You will receive a reminder letter in the mail two months in advance. If you don't receive a letter, please call our office to schedule the follow-up appointment.  If you need a refill on your cardiac medications before your next appointment, please call your pharmacy.  Thank you for choosing [facility]!!   Trinidad Curet, RN [phone]  Any Other Special Instructions Will Be Listed Below (If Applicable).  Heart-Healthy Eating Plan Many factors influence your heart health, including eating and exercise habits. Heart (coronary) risk increases with abnormal blood fat (lipid) levels. Heart-healthy meal planning includes limiting unhealthy fats, increasing healthy fats, and making other small dietary changes. This includes maintaining a healthy body weight to help keep lipid levels within a normal range. WHAT IS MY PLAN?  Your health care provider recommends that you:  Get no more than _________% of the total calories in your daily diet from fat.  Limit your intake of saturated fat to less than _________% of your total calories each day.  Limit the amount of cholesterol in your diet to less than _________ mg per day. WHAT TYPES  OF FAT SHOULD I CHOOSE?  Choose healthy fats more often. Choose monounsaturated and polyunsaturated fats, such as olive oil and canola oil, flaxseeds, walnuts, almonds, and seeds.  Eat more omega-3 fats. Good choices include salmon, mackerel, sardines, tuna, flaxseed oil, and ground flaxseeds. Aim to eat fish at least two times each week.  Limit saturated fats. Saturated fats are primarily found in animal products, such as meats, butter, and cream. Plant sources of saturated fats include palm oil, palm kernel oil, and coconut oil.  Avoid foods with partially hydrogenated oils in them. These contain trans fats. Examples of foods that contain trans fats are stick margarine, some tub margarines, cookies, crackers, and other baked goods. WHAT GENERAL GUIDELINES DO I NEED TO FOLLOW?  Check food labels carefully to identify foods with trans fats or high amounts of saturated fat.  Fill one half of your plate with vegetables and green salads. Eat 4-5 servings of vegetables per day. A serving of vegetables equals 1 cup of raw leafy vegetables,  cup of raw or cooked cut-up vegetables, or  cup of vegetable juice.  Fill one fourth of your plate with whole grains. Look for the word "whole" as the first word in the ingredient list.  Fill one fourth of your plate with lean protein foods.  Eat 4-5 servings of fruit per day. A serving of fruit equals one medium whole fruit,  cup of dried fruit,  cup of fresh, frozen, or canned fruit, or  cup of 100% fruit juice.  Eat more foods that contain soluble fiber. Examples of foods that contain this type of fiber are apples, broccoli, carrots, beans, peas, and barley. Aim to get 20-30 g  of fiber per day.  Eat more home-cooked food and less restaurant, buffet, and fast food.  Limit or avoid alcohol.  Limit foods that are high in starch and sugar.  Avoid fried foods.  Cook foods by using methods other than frying. Baking, boiling, grilling, and broiling are  all great options. Other fat-reducing suggestions include:  Removing the skin from poultry.  Removing all visible fats from meats.  Skimming the fat off of stews, soups, and gravies before serving them.  Steaming vegetables in water or broth.  Lose weight if you are overweight. Losing just 5-10% of your initial body weight can help your overall health and prevent diseases such as diabetes and heart disease.  Increase your consumption of nuts, legumes, and seeds to 4-5 servings per week. One serving of dried beans or legumes equals  cup after being cooked, one serving of nuts equals 1 ounces, and one serving of seeds equals  ounce or 1 tablespoon.  You may need to monitor your salt (sodium) intake, especially if you have high blood pressure. Talk with your health care provider or dietitian to get more information about reducing sodium. WHAT FOODS CAN I EAT? Grains Breads, including [place], white, pita, wheat, raisin, rye, oatmeal, and [place]. Tortillas that are neither fried nor made with lard or trans fat. Low-fat rolls, including hotdog and hamburger buns and English muffins. Biscuits. Muffins. Waffles. Pancakes. Light popcorn. Whole-grain cereals. Flatbread. Melba toast. Pretzels. Breadsticks. Rusks. Low-fat snacks and crackers, including oyster, saltine, matzo, graham, animal, and rye. Rice and pasta, including brown rice and those that are made with whole wheat. Vegetables All vegetables. Fruits All fruits, but limit coconut. Meats and Other Protein Sources Lean, well-trimmed beef, veal, pork, and lamb. Chicken and [place] without skin. All fish and shellfish. Wild duck, rabbit, pheasant, and venison. Egg whites or low-cholesterol egg substitutes. Dried beans, peas, lentils, and tofu.Seeds and most nuts. Dairy Low-fat or nonfat cheeses, including ricotta, string, and mozzarella. Skim or 1% milk that is liquid, powdered, or evaporated. Buttermilk that is made with low-fat milk. Nonfat  or low-fat yogurt. Beverages Mineral water. Diet carbonated beverages. Sweets and Desserts Sherbets and fruit ices. Honey, jam, marmalade, jelly, and syrups. Meringues and gelatins. Pure sugar candy, such as hard candy, jelly beans, gumdrops, mints, marshmallows, and small amounts of dark chocolate. [employer] Eat all sweets and desserts in moderation. Fats and Oils Nonhydrogenated (trans-free) margarines. Vegetable oils, including soybean, sesame, sunflower, olive, peanut, safflower, corn, canola, and cottonseed. Salad dressings or mayonnaise that are made with a vegetable oil. Limit added fats and oils that you use for cooking, baking, salads, and as spreads. Other Cocoa powder. Coffee and tea. All seasonings and condiments. The items listed above may not be a complete list of recommended foods or beverages. Contact your dietitian for more options. WHAT FOODS ARE NOT RECOMMENDED? Grains Breads that are made with saturated or trans fats, oils, or whole milk. Croissants. Butter rolls. Cheese breads. Sweet rolls. Donuts. Buttered popcorn. Chow mein noodles. High-fat crackers, such as cheese or butter crackers. Meats and Other Protein Sources Fatty meats, such as hotdogs, short ribs, sausage, spareribs, bacon, ribeye roast or steak, and mutton. High-fat deli meats, such as salami and bologna. Caviar. Domestic duck and goose. Organ meats, such as kidney, liver, sweetbreads, brains, gizzard, chitterlings, and heart. Dairy Cream, sour cream, cream cheese, and creamed cottage cheese. Whole milk cheeses, including blue (bleu), Monte[place] Jack, [place], [place], American, [place], [place], [place], [place], and [place]. Whole or  2% milk that is liquid, evaporated, or condensed. Whole buttermilk. Cream sauce or high-fat cheese sauce. Yogurt that is made from whole milk. Beverages Regular sodas and drinks with added sugar. Sweets and Desserts Frosting. Pudding. Cookies. Cakes other than angel food cake.  Candy that has milk chocolate or white chocolate, hydrogenated fat, butter, coconut, or unknown ingredients. Buttered syrups. Full-fat ice cream or ice cream drinks. Fats and Oils Gravy that has suet, meat fat, or shortening. Cocoa butter, hydrogenated oils, palm oil, coconut oil, palm kernel oil. These can often be found in baked products, candy, fried foods, nondairy creamers, and whipped toppings. Solid fats and shortenings, including bacon fat, salt pork, lard, and butter. Nondairy cream substitutes, such as coffee creamers and sour cream substitutes. Salad dressings that are made of unknown oils, cheese, or sour cream. The items listed above may not be a complete list of foods and beverages to avoid. Contact your dietitian for more information.   This information is not intended to replace advice given to you by your health care provider. Make sure you discuss any questions you have with your health care provider.   Document Released: 09/12/2008 Document Revised: 12/25/2014 Document Reviewed: 05/28/2014 Elsevier Interactive Patient Education [employer].

## 2016-09-05 NOTE — Progress Notes (Signed)
Cardiology Office Note   Date:  09/05/2016   ID:  [name], DOB December 31, 1955, MRN [id number]  PCP:  [name], MD  Cardiologist:  Constance Haw, MD    Chief Complaint  Patient presents with  . Follow-up    CAD     History of Present Illness: [name] is a 60 y.o. female who presents today for cardiology evaluation.   She has history of coronary artery disease, hypertension, hyperlipidemia. She says that she had a heart catheterization 2003 that showed minimal coronary artery disease. Since then she has had stress tests and echocardiograms yearly. Admitted to the hospital with chest pain and SVT.  She was started on 12.5 mg metoprolol for her SVT. Was also noted to have AF post adenosine but was short lived. Heart catheterization was done which showed proximal RCA lesion 45% stenosis, ostial LAD to proximal LAD lesion 25% stenosed, mid LAD to distal LAD lesion 40% stenosis, distal LAD lesion 60%, 20% stenosed ostial first marginal lesion, 20% stenosed left ventricular systolic function normal J plan diastolic pressure is normal also second margin lesion 90% and medical management was recommended.   Today, she denies symptoms of orthopnea, PND, lower extremity edema, claudication, dizziness, presyncope, syncope, bleeding, or neurologic sequela. The patient is tolerating medications without difficulties and is otherwise without complaint today.  Today, she says that she is trying to keep a heart healthy diet, stop smoking, and exercise more. She does have episodes of palpitations, where she says that her heart rate goes up and down without any reason. She was recent seen by her primary doctor who found that her blood pressure was in the 90s and heart rate was in the 40s on 12.5 mg of metoprolol. The metoprolol was stopped.   Past Medical History:  Diagnosis Date  . Agoraphobia without history of panic disorder   . Anxiety   . Asthma   . Coronary artery disease  CARDIOLOGIST- DR  Doylene Canard  (VISIT 03-30-11 W/ CHART)   NON-OBS. CAD   (STRESS TEST NOV. 2011  . DDD (degenerative disc disease)   . Depression   . DJD (degenerative joint disease)    JOINT PAIN  . Fibromyalgia   . Frequency of urination   . GERD (gastroesophageal reflux disease) AND HIATIAL HERNIA   CONTROLLED W/ NEXIUM  . Hemorrhoids   . History of gastric ulcer 2004  . Hypertension   . IBS (irritable bowel syndrome)   . Incisional pain s/p interstim implant 1st stage--- 12-07-11    left upper buttock-- pt states dressing clean dry and intact (on 12-08-11)  . Insomnia   . Iron deficiency anemia   . Nocturia   . Non-productive cough   . Numbness in both hands AT TIMES  . OSA on CPAP   . Urge urinary incontinence    Past Surgical History:  Procedure Laterality Date  . CARDIAC CATHETERIZATION  2003  . CARDIAC CATHETERIZATION N/A 08/23/2016   Procedure: Left Heart Cath and Coronary Angiography;  Surgeon: Dixie Dials, MD;  Location: [facility];  Service: Cardiovascular;  Laterality: N/A;  . CERVICAL DISCECTOMY  2002   C4 - 7  . CERVICAL FUSION  2008   C4 - T1  . CHOLECYSTECTOMY  1982  . CYSTO/ HOD/ BLADDER BX  2006  . CYSTO/ HOD/ BLADDER BX/ FULGERATION  06-12-2011  . HYSTEROSCOPY W/D&C  2005  . INTERSTIM IMPLANT PLACEMENT  12/07/2011   Procedure: INTERSTIM IMPLANT FIRST STAGE;  Surgeon: [name], MD;  Location[facility];  Service: Urology;  Laterality: Right;  . INTERSTIM IMPLANT PLACEMENT  12/14/2011   Procedure: Barrie Lyme IMPLANT SECOND STAGE;  Surgeon: [name], MD;  Location: [facility];  Service: Urology;  Laterality: Right;  rad tech ok by vickie at [facility]  . LAMINECTOMY  2007   L3 - 5  . RIGHT THUMB SURG.  2001  . TONSILLECTOMY AND ADENOIDECTOMY  1965  . WRIST SURGERY Left    [facility]     Current Outpatient Prescriptions  Medication Sig Dispense Refill  . albuterol (PROVENTIL HFA;VENTOLIN HFA) 108 (90 BASE)  MCG/ACT inhaler Inhale 2 puffs into the lungs every 6 (six) hours as needed for wheezing or shortness of breath.    Marland Kitchen amLODipine (NORVASC) 5 MG tablet Take 5 mg by mouth every morning.      . Ascorbic Acid (VITAMIN C) 1000 MG tablet Take 1,000 mg by mouth daily.    Marland Kitchen aspirin EC 325 MG tablet Take 325 mg by mouth every morning.     Marland Kitchen buPROPion (WELLBUTRIN XL) 150 MG 24 hr tablet Take 150 mg by mouth daily.     . cholecalciferol (VITAMIN D) 1000 UNITS tablet Take 1,000 Units by mouth every morning.     . clonazePAM (KLONOPIN) 1 MG tablet Take 1 mg by mouth 2 (two) times daily.  5  . DULoxetine (CYMBALTA) 60 MG capsule Take 60 mg by mouth at bedtime.     . fenofibrate (TRICOR) 48 MG tablet Take 48 mg by mouth every morning.   6  . ferrous sulfate 325 (65 FE) MG tablet Take 325 mg by mouth daily with breakfast.    . HYDROcodone-acetaminophen (NORCO) 5-325 MG per tablet Take 1 tablet by mouth 2 (two) times daily as needed for pain.     Marland Kitchen ibuprofen (ADVIL,MOTRIN) 200 MG tablet Take 600 mg by mouth every 6 (six) hours as needed for pain.    . isosorbide mononitrate (IMDUR) 60 MG 24 hr tablet Take 1 tablet (60 mg total) by mouth daily. 90 tablet 3  . loperamide (IMODIUM) 2 MG capsule Take 1 capsule (2 mg total) by mouth 4 (four) times daily as needed for diarrhea or loose stools. 12 capsule 0  . meclizine (ANTIVERT) 12.5 MG tablet TAKE 1 TABLET 3 TIMES A DAY AS NEEDED FOR DIZZYNESS  0  . methocarbamol (ROBAXIN) 500 MG tablet Take 1 tablet (500 mg total) by mouth every 8 (eight) hours as needed (for muscle spasms). 45 tablet 3  . nicotine (NICODERM CQ - DOSED IN MG/24 HOURS) 21 mg/24hr patch Place 1 patch (21 mg total) onto the skin daily. 28 patch 0  . nitroGLYCERIN (NITROSTAT) 0.4 MG SL tablet Place 1 tablet (0.4 mg total) under the tongue every 5 (five) minutes x 3 doses as needed for chest pain. 10 tablet 0  . ondansetron (ZOFRAN) 4 MG tablet Take 1 tablet (4 mg total) by mouth every 6 (six) hours as  needed for nausea or vomiting. 12 tablet 0  . pantoprazole (PROTONIX) 40 MG tablet Take 1 tablet (40 mg total) by mouth daily as needed. 20 tablet 0  . PRESCRIPTION MEDICATION Inner Stem therapy for overactive bladder implant on the right side.    . SUPER B COMPLEX/C PO Take 1 tablet by mouth every morning.    . traZODone (DESYREL) 50 MG tablet Take 50 mg by mouth at bedtime.       No current  facility-administered medications for this visit.     Allergies:   Statins; Ace inhibitors; Amoxicillin; Midazolam hcl; Nsaids; Prednisone; and Sulfa antibiotics   Social History:  The patient  reports that she has been smoking.  She has a 60.00 pack-year smoking history. She has never used smokeless tobacco. She reports that she does not drink alcohol or use drugs.   Family History:  The patient's family history includes Alzheimer's disease in her mother; Breast cancer in her mother; Coronary artery disease in her mother; Heart disease in her father; Hypertension in her brother, daughter, father, mother, and sister; Kidney failure in her father.    ROS:  Please see the history of present illness.   Otherwise, review of systems is positive for chills, chest pain, palpitations, cough, dyspnea on exertion, snoring, nausea, abdominal pain, diarrhea, depression, muscle pain, dizziness, easy bruising, headaches and is 48 hour monitor of 66 but she.   All other systems are reviewed and negative.    PHYSICAL EXAM: VS:  BP 118/66   Pulse 66   Ht 4' 11.5" (1.511 m)   Wt 145 lb (65.8 kg)   LMP 09/06/2005   BMI 28.80 kg/m  , BMI Body mass index is 28.8 kg/m. GEN: Well nourished, well developed, in no acute distress  HEENT: normal  Neck: no JVD, carotid bruits, or masses Cardiac: RRR; no murmurs, rubs, or gallops,no edema  Respiratory:  clear to auscultation bilaterally, normal work of breathing GI: soft, nontender, nondistended, + BS MS: no deformity or atrophy  Skin: warm and dry Neuro:  Strength and  sensation are intact Psych: euthymic mood, full affect  EKG:  EKG is not ordered today. Personal review of the ECG ordered 08/27/16 shows sinus rhythm, rate 70, possible septal infarct  Recent Labs: 10/11/2015: ALT 37 08/22/2016: TSH 2.935 08/27/2016: BUN 21; Creatinine, Ser 1.16; Hemoglobin 15.3; Magnesium 2.2; Platelets 302; Potassium 4.6; Sodium 136    Lipid Panel     Component Value Date/Time   CHOL 160 08/22/2016 0351   TRIG 131 08/22/2016 0351   HDL 34 (L) 08/22/2016 0351   CHOLHDL 4.7 08/22/2016 0351   VLDL 26 08/22/2016 0351   LDLCALC 100 (H) 08/22/2016 0351     Wt Readings from Last 3 Encounters:  09/05/16 145 lb (65.8 kg)  08/22/16 146 lb 11.2 oz (66.5 kg)  04/12/16 146 lb (66.2 kg)      Other studies Reviewed: Additional studies/ records that were reviewed today include: Cardiac cath 08/23/16, TTE 08/24/16   Prox RCA lesion, 45 %stenosed.  Ost LAD to Prox LAD lesion, 25 %stenosed.  Mid LAD to Dist LAD lesion, 40 %stenosed.  Dist LAD lesion, 60 %stenosed.  Ost 1st Mrg lesion, 20 %stenosed.  The left ventricular systolic function is normal.  LV end diastolic pressure is normal.  Ost 2nd Mrg lesion, 90 %stenosed.  - Left ventricle: The cavity size was normal. There was mild   concentric hypertrophy. Systolic function was normal. The   estimated ejection fraction was in the range of 60% to 65%. Wall   motion was normal; there were no regional wall motion   abnormalities. Doppler parameters are consistent with abnormal   left ventricular relaxation (grade 1 diastolic dysfunction). - Mitral valve: Calcified annulus.   ASSESSMENT AND PLAN:  1.  Coronary artery disease: Recent catheterization showing coronary artery disease. Thought should be on medical management.  2. Hypertension: Currently well controlled, we'll make no medical changes at this time.  3. Hyperlipidemia: The patient had  reactions to multiple statins, and has been unable to tolerate that  class of medications. She is currently on ascorbic acid. I Jaelah Hauth refer her to lipid clinic to further determine if she is a candidate for other medications.  4. Mid RP tachycardia: unclear if her palpitations currently are atrial fibrillation.  Cleto Claggett place 48 hour monitor.  5. Atrial fibrillation: no anticoagulationat this time as she only AF was after adenosine   Current medicines are reviewed at length with the patient today.   The patient does not have concerns regarding her medicines.  The following changes were made today:  Increase Imdur to 60 mg, decrease aspirin to 81 mg  Labs/ tests ordered today include:  No orders of the defined types were placed in this encounter.    Disposition:   FU with [facility] 6 months  Signed, Edahi Kroening Meredith Leeds, MD  09/05/2016 10:12 AM     [facility] [address] [place] [address] [phone] (office) [phone] (fax)

## 2016-09-14 ENCOUNTER — Ambulatory Visit (INDEPENDENT_AMBULATORY_CARE_PROVIDER_SITE_OTHER): Payer: Medicare Other

## 2016-09-14 DIAGNOSIS — R002 Palpitations: Secondary | ICD-10-CM | POA: Diagnosis not present

## 2016-09-27 ENCOUNTER — Telehealth: Payer: Self-pay | Admitting: Cardiology

## 2016-09-27 DIAGNOSIS — I10 Essential (primary) hypertension: Secondary | ICD-10-CM | POA: Diagnosis not present

## 2016-09-27 DIAGNOSIS — I2510 Atherosclerotic heart disease of native coronary artery without angina pectoris: Secondary | ICD-10-CM | POA: Diagnosis not present

## 2016-09-27 DIAGNOSIS — F17210 Nicotine dependence, cigarettes, uncomplicated: Secondary | ICD-10-CM | POA: Diagnosis not present

## 2016-09-27 NOTE — Telephone Encounter (Signed)
Lm to call back regarding monitor results.

## 2016-09-27 NOTE — Telephone Encounter (Signed)
Returning call. Advised that results of her 48 hr monitor were not back as of yet.  Advised once Dr. Curt Bears reviewed she would be called.  Will forward to Dr. Curt Bears and Trinidad Curet, RN as Juluis Rainier.

## 2016-09-27 NOTE — Telephone Encounter (Signed)
Follow Up:    Pt would her monitor results from about 2 weeks ago please.

## 2016-10-09 DIAGNOSIS — G4733 Obstructive sleep apnea (adult) (pediatric): Secondary | ICD-10-CM | POA: Diagnosis not present

## 2016-10-10 ENCOUNTER — Other Ambulatory Visit (HOSPITAL_COMMUNITY)
Admission: RE | Admit: 2016-10-10 | Discharge: 2016-10-10 | Disposition: A | Discharge status: Home / Self Care | Payer: Medicare Other | Source: Ambulatory Visit | Attending: Family Medicine | Admitting: Family Medicine

## 2016-10-10 ENCOUNTER — Other Ambulatory Visit: Payer: Self-pay | Admitting: Family Medicine

## 2016-10-10 DIAGNOSIS — R5383 Other fatigue: Secondary | ICD-10-CM | POA: Diagnosis not present

## 2016-10-10 DIAGNOSIS — D72829 Elevated white blood cell count, unspecified: Secondary | ICD-10-CM | POA: Diagnosis not present

## 2016-10-10 DIAGNOSIS — J449 Chronic obstructive pulmonary disease, unspecified: Secondary | ICD-10-CM | POA: Diagnosis not present

## 2016-10-10 DIAGNOSIS — R739 Hyperglycemia, unspecified: Secondary | ICD-10-CM | POA: Diagnosis not present

## 2016-10-10 DIAGNOSIS — Z1151 Encounter for screening for human papillomavirus (HPV): Secondary | ICD-10-CM | POA: Insufficient documentation

## 2016-10-10 DIAGNOSIS — Z0000 Encounter for general adult medical examination without abnormal findings: Secondary | ICD-10-CM | POA: Diagnosis not present

## 2016-10-10 DIAGNOSIS — I10 Essential (primary) hypertension: Secondary | ICD-10-CM | POA: Diagnosis not present

## 2016-10-10 DIAGNOSIS — F17200 Nicotine dependence, unspecified, uncomplicated: Secondary | ICD-10-CM | POA: Diagnosis not present

## 2016-10-10 DIAGNOSIS — F329 Major depressive disorder, single episode, unspecified: Secondary | ICD-10-CM | POA: Diagnosis not present

## 2016-10-10 DIAGNOSIS — Z01419 Encounter for gynecological examination (general) (routine) without abnormal findings: Secondary | ICD-10-CM | POA: Diagnosis not present

## 2016-10-10 DIAGNOSIS — E559 Vitamin D deficiency, unspecified: Secondary | ICD-10-CM | POA: Diagnosis not present

## 2016-10-10 DIAGNOSIS — E782 Mixed hyperlipidemia: Secondary | ICD-10-CM | POA: Diagnosis not present

## 2016-10-10 DIAGNOSIS — R7309 Other abnormal glucose: Secondary | ICD-10-CM | POA: Diagnosis not present

## 2016-10-10 DIAGNOSIS — Z124 Encounter for screening for malignant neoplasm of cervix: Secondary | ICD-10-CM | POA: Diagnosis not present

## 2016-10-11 LAB — CYTOLOGY - PAP
DIAGNOSIS: NEGATIVE
HPV: NOT DETECTED

## 2016-10-14 ENCOUNTER — Inpatient Hospital Stay (HOSPITAL_COMMUNITY)
Admission: EM | Admit: 2016-10-14 | Discharge: 2016-10-17 | DRG: 191 | Disposition: A | Discharge status: Home / Self Care | Payer: Medicare Other | Attending: Cardiology | Admitting: Cardiology

## 2016-10-14 ENCOUNTER — Encounter (HOSPITAL_COMMUNITY): Payer: Self-pay | Admitting: Emergency Medicine

## 2016-10-14 ENCOUNTER — Emergency Department (HOSPITAL_COMMUNITY): Payer: Medicare Other

## 2016-10-14 DIAGNOSIS — F4002 Agoraphobia without panic disorder: Secondary | ICD-10-CM | POA: Diagnosis present

## 2016-10-14 DIAGNOSIS — J449 Chronic obstructive pulmonary disease, unspecified: Secondary | ICD-10-CM | POA: Diagnosis not present

## 2016-10-14 DIAGNOSIS — Z820 Family history of epilepsy and other diseases of the nervous system: Secondary | ICD-10-CM

## 2016-10-14 DIAGNOSIS — Z881 Allergy status to other antibiotic agents status: Secondary | ICD-10-CM

## 2016-10-14 DIAGNOSIS — R079 Chest pain, unspecified: Secondary | ICD-10-CM

## 2016-10-14 DIAGNOSIS — Z888 Allergy status to other drugs, medicaments and biological substances status: Secondary | ICD-10-CM

## 2016-10-14 DIAGNOSIS — J440 Chronic obstructive pulmonary disease with acute lower respiratory infection: Principal | ICD-10-CM | POA: Diagnosis present

## 2016-10-14 DIAGNOSIS — E785 Hyperlipidemia, unspecified: Secondary | ICD-10-CM | POA: Diagnosis not present

## 2016-10-14 DIAGNOSIS — Z7982 Long term (current) use of aspirin: Secondary | ICD-10-CM

## 2016-10-14 DIAGNOSIS — M797 Fibromyalgia: Secondary | ICD-10-CM | POA: Diagnosis present

## 2016-10-14 DIAGNOSIS — Z8249 Family history of ischemic heart disease and other diseases of the circulatory system: Secondary | ICD-10-CM

## 2016-10-14 DIAGNOSIS — I25110 Atherosclerotic heart disease of native coronary artery with unstable angina pectoris: Secondary | ICD-10-CM | POA: Diagnosis present

## 2016-10-14 DIAGNOSIS — Z841 Family history of disorders of kidney and ureter: Secondary | ICD-10-CM

## 2016-10-14 DIAGNOSIS — Z886 Allergy status to analgesic agent status: Secondary | ICD-10-CM

## 2016-10-14 DIAGNOSIS — I200 Unstable angina: Secondary | ICD-10-CM | POA: Diagnosis present

## 2016-10-14 DIAGNOSIS — I471 Supraventricular tachycardia: Secondary | ICD-10-CM

## 2016-10-14 DIAGNOSIS — I10 Essential (primary) hypertension: Secondary | ICD-10-CM | POA: Diagnosis present

## 2016-10-14 DIAGNOSIS — E876 Hypokalemia: Secondary | ICD-10-CM | POA: Diagnosis present

## 2016-10-14 DIAGNOSIS — R0789 Other chest pain: Secondary | ICD-10-CM | POA: Diagnosis not present

## 2016-10-14 DIAGNOSIS — R002 Palpitations: Secondary | ICD-10-CM | POA: Diagnosis not present

## 2016-10-14 DIAGNOSIS — F17290 Nicotine dependence, other tobacco product, uncomplicated: Secondary | ICD-10-CM | POA: Diagnosis not present

## 2016-10-14 DIAGNOSIS — F17200 Nicotine dependence, unspecified, uncomplicated: Secondary | ICD-10-CM | POA: Diagnosis present

## 2016-10-14 DIAGNOSIS — I208 Other forms of angina pectoris: Secondary | ICD-10-CM

## 2016-10-14 DIAGNOSIS — Z720 Tobacco use: Secondary | ICD-10-CM | POA: Diagnosis not present

## 2016-10-14 DIAGNOSIS — Z803 Family history of malignant neoplasm of breast: Secondary | ICD-10-CM

## 2016-10-14 DIAGNOSIS — Z882 Allergy status to sulfonamides status: Secondary | ICD-10-CM

## 2016-10-14 DIAGNOSIS — K219 Gastro-esophageal reflux disease without esophagitis: Secondary | ICD-10-CM | POA: Diagnosis present

## 2016-10-14 LAB — BASIC METABOLIC PANEL
Anion gap: 9 (ref 5–15)
BUN: 17 mg/dL (ref 6–20)
CHLORIDE: 107 mmol/L (ref 101–111)
CO2: 25 mmol/L (ref 22–32)
CREATININE: 0.96 mg/dL (ref 0.44–1.00)
Calcium: 10.6 mg/dL — ABNORMAL HIGH (ref 8.9–10.3)
GFR calc non Af Amer: >60 mL/min (ref >60)
Glucose, Bld: 129 mg/dL — ABNORMAL HIGH (ref 65–99)
Potassium: 3.4 mmol/L — ABNORMAL LOW (ref 3.5–5.1)
Sodium: 141 mmol/L (ref 135–145)

## 2016-10-14 LAB — MAGNESIUM: MAGNESIUM: 1.8 mg/dL (ref 1.7–2.4)

## 2016-10-14 LAB — I-STAT TROPONIN, ED: Troponin i, poc: 0.01 ng/mL (ref 0.00–0.08)

## 2016-10-14 LAB — CBC
HEMATOCRIT: 48.3 % — AB (ref 36.0–46.0)
Hemoglobin: 16.3 g/dL — ABNORMAL HIGH (ref 12.0–15.0)
MCH: 30 pg (ref 26.0–34.0)
MCHC: 33.7 g/dL (ref 30.0–36.0)
MCV: 89 fL (ref 78.0–100.0)
PLATELETS: 288 10*3/uL (ref 150–400)
RBC: 5.43 MIL/uL — AB (ref 3.87–5.11)
RDW: 12.5 % (ref 11.5–15.5)
WBC: 14.7 10*3/uL — ABNORMAL HIGH (ref 4.0–10.5)

## 2016-10-14 LAB — TROPONIN I

## 2016-10-14 LAB — HEPARIN LEVEL (UNFRACTIONATED)
HEPARIN UNFRACTIONATED: 0.23 [IU]/mL — AB (ref 0.30–0.70)
Heparin Unfractionated: 0.11 IU/mL — ABNORMAL LOW (ref 0.30–0.70)

## 2016-10-14 LAB — TSH: TSH: 1.803 u[IU]/mL (ref 0.350–4.500)

## 2016-10-14 MED ORDER — HEPARIN (PORCINE) IN NACL 100-0.45 UNIT/ML-% IJ SOLN
1100.0000 [IU]/h | INTRAMUSCULAR | Status: DC
  Administered 2016-10-14: 750 [IU]/h via INTRAVENOUS
  Administered 2016-10-15 – 2016-10-16 (×2): 1100 [IU]/h via INTRAVENOUS
  Filled 2016-10-14 (×4): qty 250

## 2016-10-14 MED ORDER — TRAZODONE HCL 50 MG PO TABS
50.0000 mg | ORAL_TABLET | Freq: Every day | ORAL | Status: DC
  Administered 2016-10-14 – 2016-10-16 (×3): 50 mg via ORAL
  Filled 2016-10-14 (×3): qty 1

## 2016-10-14 MED ORDER — METOPROLOL TARTRATE 5 MG/5ML IV SOLN
5.0000 mg | Freq: Once | INTRAVENOUS | Status: AC
  Administered 2016-10-14: 5 mg via INTRAVENOUS
  Filled 2016-10-14: qty 5

## 2016-10-14 MED ORDER — SODIUM CHLORIDE 0.9 % IV SOLN
INTRAVENOUS | Status: DC
  Administered 2016-10-15 – 2016-10-17 (×3): via INTRAVENOUS

## 2016-10-14 MED ORDER — ACETAMINOPHEN 325 MG PO TABS
650.0000 mg | ORAL_TABLET | ORAL | Status: DC | PRN
  Administered 2016-10-15: 650 mg via ORAL
  Filled 2016-10-14: qty 2

## 2016-10-14 MED ORDER — BUPROPION HCL ER (XL) 150 MG PO TB24
150.0000 mg | ORAL_TABLET | Freq: Every day | ORAL | Status: DC
  Administered 2016-10-14 – 2016-10-17 (×4): 150 mg via ORAL
  Filled 2016-10-14 (×4): qty 1

## 2016-10-14 MED ORDER — METOPROLOL TARTRATE 12.5 MG HALF TABLET
12.5000 mg | ORAL_TABLET | Freq: Two times a day (BID) | ORAL | Status: DC
  Administered 2016-10-14 – 2016-10-15 (×3): 12.5 mg via ORAL
  Filled 2016-10-14 (×4): qty 1

## 2016-10-14 MED ORDER — ASPIRIN EC 81 MG PO TBEC
81.0000 mg | DELAYED_RELEASE_TABLET | Freq: Every day | ORAL | Status: DC
  Administered 2016-10-15 – 2016-10-17 (×3): 81 mg via ORAL
  Filled 2016-10-14 (×2): qty 1

## 2016-10-14 MED ORDER — LEVALBUTEROL HCL 0.63 MG/3ML IN NEBU
0.6300 mg | INHALATION_SOLUTION | Freq: Four times a day (QID) | RESPIRATORY_TRACT | Status: DC
  Administered 2016-10-14: 0.63 mg via RESPIRATORY_TRACT
  Filled 2016-10-14: qty 3

## 2016-10-14 MED ORDER — ASPIRIN 300 MG RE SUPP: 300.0000 mg | RECTAL | Status: AC

## 2016-10-14 MED ORDER — FENOFIBRATE 160 MG PO TABS
160.0000 mg | ORAL_TABLET | Freq: Every day | ORAL | Status: DC
  Administered 2016-10-14 – 2016-10-17 (×4): 160 mg via ORAL
  Filled 2016-10-14 (×4): qty 1

## 2016-10-14 MED ORDER — LEVALBUTEROL HCL 0.63 MG/3ML IN NEBU
0.6300 mg | INHALATION_SOLUTION | Freq: Three times a day (TID) | RESPIRATORY_TRACT | Status: DC
  Administered 2016-10-14 – 2016-10-16 (×5): 0.63 mg via RESPIRATORY_TRACT
  Filled 2016-10-14 (×5): qty 3

## 2016-10-14 MED ORDER — NITROGLYCERIN 0.4 MG SL SUBL: 0.4000 mg | SUBLINGUAL_TABLET | SUBLINGUAL | Status: DC | PRN

## 2016-10-14 MED ORDER — NITROGLYCERIN IN D5W 200-5 MCG/ML-% IV SOLN: 5.0000 ug/min | INTRAVENOUS | Status: DC

## 2016-10-14 MED ORDER — PANTOPRAZOLE SODIUM 40 MG PO TBEC
40.0000 mg | DELAYED_RELEASE_TABLET | Freq: Every day | ORAL | Status: DC
  Administered 2016-10-14 – 2016-10-17 (×4): 40 mg via ORAL
  Filled 2016-10-14 (×4): qty 1

## 2016-10-14 MED ORDER — NITROGLYCERIN IN D5W 200-5 MCG/ML-% IV SOLN
0.0000 ug/min | Freq: Once | INTRAVENOUS | Status: AC
  Administered 2016-10-14: 5 ug/min via INTRAVENOUS
  Filled 2016-10-14: qty 250

## 2016-10-14 MED ORDER — DULOXETINE HCL 60 MG PO CPEP
60.0000 mg | ORAL_CAPSULE | Freq: Every day | ORAL | Status: DC
  Administered 2016-10-14 – 2016-10-16 (×3): 60 mg via ORAL
  Filled 2016-10-14 (×3): qty 1

## 2016-10-14 MED ORDER — HEPARIN BOLUS VIA INFUSION
3000.0000 [IU] | Freq: Once | INTRAVENOUS | Status: AC
  Administered 2016-10-14: 3000 [IU] via INTRAVENOUS
  Filled 2016-10-14: qty 3000

## 2016-10-14 MED ORDER — ASPIRIN 81 MG PO CHEW
324.0000 mg | CHEWABLE_TABLET | ORAL | Status: AC
  Administered 2016-10-14: 324 mg via ORAL
  Filled 2016-10-14: qty 4

## 2016-10-14 MED ORDER — ONDANSETRON HCL 4 MG/2ML IJ SOLN: 4.0000 mg | Freq: Four times a day (QID) | INTRAMUSCULAR | Status: DC | PRN

## 2016-10-14 NOTE — Progress Notes (Signed)
ANTICOAGULATION CONSULT NOTE - Initial Consult  Pharmacy Consult for Heparin Indication: chest pain/ACS  Allergies  Allergen Reactions  . Statins Other (See Comments)    Causes muscle weakness and joint pain  . Ace Inhibitors Other (See Comments)    DECREASES HEARTRATE  . Amoxicillin Diarrhea  . Midazolam Hcl     HYPER  . Nsaids     AVOIDS HX GASTRIC ULCER  . Prednisone Other (See Comments)    HYPER  . Sulfa Antibiotics Rash and Other (See Comments)    JOINT PAIN    Patient Measurements: Height: 4\' 9"  (144.8 cm) Weight: 140 lb (63.5 kg) IBW/kg (Calculated) : 38.6 Heparin Dosing Weight: 55 kg  Vital Signs: Temp: 98.3 F (36.8 C) (10/28 0400) Temp Source: Oral (10/28 0400) BP: 136/71 (10/28 0715) Pulse Rate: 68 (10/28 0715)  Labs:  Recent Labs  10/14/16 0425  HGB 16.3*  HCT 48.3*  PLT 288  CREATININE 0.96    Estimated Creatinine Clearance: 46.6 mL/min (by C-G formula based on SCr of 0.96 mg/dL).   Medical History: Past Medical History:  Diagnosis Date  . Agoraphobia without history of panic disorder   . Anxiety   . Asthma   . Coronary artery disease CARDIOLOGIST- DR  Doylene Canard  (VISIT 03-30-11 W/ CHART)   NON-OBS. CAD   (STRESS TEST NOV. 2011  . DDD (degenerative disc disease)   . Depression   . DJD (degenerative joint disease)    JOINT PAIN  . Fibromyalgia   . Frequency of urination   . GERD (gastroesophageal reflux disease) AND HIATIAL HERNIA   CONTROLLED W/ NEXIUM  . Hemorrhoids   . History of gastric ulcer 2004  . Hypertension   . IBS (irritable bowel syndrome)   . Incisional pain s/p interstim implant 1st stage--- 12-07-11    left upper buttock-- pt states dressing clean dry and intact (on 12-08-11)  . Insomnia   . Iron deficiency anemia   . Nocturia   . Non-productive cough   . Numbness in both hands AT TIMES  . OSA on CPAP   . Urge urinary incontinence     Medications:  No current facility-administered medications on file prior to  encounter.    Current Outpatient Prescriptions on File Prior to Encounter  Medication Sig Dispense Refill  . albuterol (PROVENTIL HFA;VENTOLIN HFA) 108 (90 BASE) MCG/ACT inhaler Inhale 2 puffs into the lungs every 6 (six) hours as needed for wheezing or shortness of breath.    Marland Kitchen amLODipine (NORVASC) 5 MG tablet Take 5 mg by mouth every morning.      . Ascorbic Acid (VITAMIN C) 1000 MG tablet Take 1,000 mg by mouth daily.    Marland Kitchen aspirin EC 325 MG tablet Take 325 mg by mouth every morning.     Marland Kitchen buPROPion (WELLBUTRIN XL) 150 MG 24 hr tablet Take 150 mg by mouth daily.     . cholecalciferol (VITAMIN D) 1000 UNITS tablet Take 1,000 Units by mouth every morning.     . clonazePAM (KLONOPIN) 1 MG tablet Take 1 mg by mouth 2 (two) times daily.  5  . DULoxetine (CYMBALTA) 60 MG capsule Take 60 mg by mouth at bedtime.     . fenofibrate (TRICOR) 48 MG tablet Take 48 mg by mouth every morning.   6  . ferrous sulfate 325 (65 FE) MG tablet Take 325 mg by mouth daily with breakfast.    . HYDROcodone-acetaminophen (NORCO) 5-325 MG per tablet Take 1 tablet by mouth 2 (two) times  daily as needed for pain.     Marland Kitchen ibuprofen (ADVIL,MOTRIN) 200 MG tablet Take 600 mg by mouth every 6 (six) hours as needed for pain.    . isosorbide mononitrate (IMDUR) 60 MG 24 hr tablet Take 1 tablet (60 mg total) by mouth daily. 90 tablet 3  . loperamide (IMODIUM) 2 MG capsule Take 1 capsule (2 mg total) by mouth 4 (four) times daily as needed for diarrhea or loose stools. 12 capsule 0  . meclizine (ANTIVERT) 12.5 MG tablet TAKE 1 TABLET 3 TIMES A DAY AS NEEDED FOR DIZZYNESS  0  . methocarbamol (ROBAXIN) 500 MG tablet Take 1 tablet (500 mg total) by mouth every 8 (eight) hours as needed (for muscle spasms). 45 tablet 3  . nicotine (NICODERM CQ - DOSED IN MG/24 HOURS) 21 mg/24hr patch Place 1 patch (21 mg total) onto the skin daily. 28 patch 0  . nitroGLYCERIN (NITROSTAT) 0.4 MG SL tablet Place 1 tablet (0.4 mg total) under the tongue  every 5 (five) minutes x 3 doses as needed for chest pain. 10 tablet 0  . ondansetron (ZOFRAN) 4 MG tablet Take 1 tablet (4 mg total) by mouth every 6 (six) hours as needed for nausea or vomiting. 12 tablet 0  . pantoprazole (PROTONIX) 40 MG tablet Take 1 tablet (40 mg total) by mouth daily as needed. 20 tablet 0  . PRESCRIPTION MEDICATION [place] Stem therapy for overactive bladder implant on the right side.    . SUPER B COMPLEX/C PO Take 1 tablet by mouth every morning.    . traZODone (DESYREL) 50 MG tablet Take 50 mg by mouth at bedtime.         Assessment: 60 y.o. female with chest pain for heparin   Goal of Therapy:  Heparin level 0.3-0.7 units/ml Monitor platelets by anticoagulation protocol: Yes   Plan:  Heparin 3000 units IV bolus, then start heparin 750 units/hr Check heparin level in 6 hours.   Stanley Helmuth, Bronson Curb 10/14/2016,7:33 AM

## 2016-10-14 NOTE — ED Provider Notes (Signed)
[facility] DEPT Provider Note   CSN: [id number] Arrival date & time: 10/14/16  [id number]     History   Chief Complaint Chief Complaint  Patient presents with  . Palpitations    HPI [name] is a 60 y.o. female.  HPI  Past Medical History:  Diagnosis Date  . Agoraphobia without history of panic disorder   . Anxiety   . Asthma   . Coronary artery disease CARDIOLOGIST- DR  Doylene Canard  (VISIT 03-30-11 W/ CHART)   NON-OBS. CAD   (STRESS TEST NOV. 2011  . DDD (degenerative disc disease)   . Depression   . DJD (degenerative joint disease)    JOINT PAIN  . Fibromyalgia   . Frequency of urination   . GERD (gastroesophageal reflux disease) AND HIATIAL HERNIA   CONTROLLED W/ NEXIUM  . Hemorrhoids   . History of gastric ulcer 2004  . Hypertension   . IBS (irritable bowel syndrome)   . Incisional pain s/p interstim implant 1st stage--- 12-07-11    left upper buttock-- pt states dressing clean dry and intact (on 12-08-11)  . Insomnia   . Iron deficiency anemia   . Nocturia   . Non-productive cough   . Numbness in both hands AT TIMES  . OSA on CPAP   . Urge urinary incontinence     Patient Active Problem List   Diagnosis Date Noted  . Chest pain 08/22/2016  . SVT (supraventricular tachycardia) ([place]) 08/22/2016  . OSA on CPAP   . Hypertension   . GERD (gastroesophageal reflux disease)   . Depression   . Coronary artery disease   . Anxiety   . Pain in the chest   . Hypokalemia   . Carpal tunnel syndrome, bilateral 02/18/2016  . Leukocytosis 05/26/2014  . Tobacco abuse 05/26/2014  . Urge incontinence 12/14/2011    Past Surgical History:  Procedure Laterality Date  . CARDIAC CATHETERIZATION  2003  . CARDIAC CATHETERIZATION N/A 08/23/2016   Procedure: Left Heart Cath and Coronary Angiography;  Surgeon: Dixie Dials, MD;  Location: [facility];  Service: Cardiovascular;  Laterality: N/A;  . CERVICAL DISCECTOMY  2002   C4 - 7  . CERVICAL FUSION  2008   C4  - T1  . CHOLECYSTECTOMY  1982  . CYSTO/ HOD/ BLADDER BX  2006  . CYSTO/ HOD/ BLADDER BX/ FULGERATION  06-12-2011  . HYSTEROSCOPY W/D&C  2005  . INTERSTIM IMPLANT PLACEMENT  12/07/2011   Procedure: Barrie Lyme IMPLANT FIRST STAGE;  Surgeon: [name], MD;  Location[facility];  Service: Urology;  Laterality: Right;  . INTERSTIM IMPLANT PLACEMENT  12/14/2011   Procedure: Barrie Lyme IMPLANT SECOND STAGE;  Surgeon: [name], MD;  Location: [facility];  Service: Urology;  Laterality: Right;  rad tech ok by vickie at [facility]  . LAMINECTOMY  2007   L3 - 5  . RIGHT THUMB SURG.  2001  . TONSILLECTOMY AND ADENOIDECTOMY  1965  . WRIST SURGERY Left    [facility]    OB History    No data available       Home Medications    Prior to Admission medications   Medication Sig Start Date End Date Taking? Authorizing Provider  albuterol (PROVENTIL HFA;VENTOLIN HFA) 108 (90 BASE) MCG/ACT inhaler Inhale 2 puffs into the lungs every 6 (six) hours as needed for wheezing or shortness of breath.    Historical Provider, MD  amLODipine (NORVASC) 5 MG tablet Take 5 mg by mouth  every morning.      Historical Provider, MD  Ascorbic Acid (VITAMIN C) 1000 MG tablet Take 1,000 mg by mouth daily.    Historical Provider, MD  aspirin EC 325 MG tablet Take 325 mg by mouth every morning.     Historical Provider, MD  buPROPion (WELLBUTRIN XL) 150 MG 24 hr tablet Take 150 mg by mouth daily.     Historical Provider, MD  cholecalciferol (VITAMIN D) 1000 UNITS tablet Take 1,000 Units by mouth every morning.     Historical Provider, MD  clonazePAM (KLONOPIN) 1 MG tablet Take 1 mg by mouth 2 (two) times daily. 05/10/15   Historical Provider, MD  DULoxetine (CYMBALTA) 60 MG capsule Take 60 mg by mouth at bedtime.     Historical Provider, MD  fenofibrate (TRICOR) 48 MG tablet Take 48 mg by mouth every morning.  04/30/15   Historical Provider, MD  ferrous sulfate 325 (65 FE) MG tablet Take  325 mg by mouth daily with breakfast.    Historical Provider, MD  HYDROcodone-acetaminophen (NORCO) 5-325 MG per tablet Take 1 tablet by mouth 2 (two) times daily as needed for pain.     Historical Provider, MD  ibuprofen (ADVIL,MOTRIN) 200 MG tablet Take 600 mg by mouth every 6 (six) hours as needed for pain.    Historical Provider, MD  isosorbide mononitrate (IMDUR) 60 MG 24 hr tablet Take 1 tablet (60 mg total) by mouth daily. 04/12/16   Will Meredith Leeds, MD  loperamide (IMODIUM) 2 MG capsule Take 1 capsule (2 mg total) by mouth 4 (four) times daily as needed for diarrhea or loose stools. 08/27/16   Forde Dandy, MD  meclizine (ANTIVERT) 12.5 MG tablet TAKE 1 TABLET 3 TIMES A DAY AS NEEDED FOR DIZZYNESS 01/12/16   Historical Provider, MD  methocarbamol (ROBAXIN) 500 MG tablet Take 1 tablet (500 mg total) by mouth every 8 (eight) hours as needed (for muscle spasms). 02/18/16   Donika K Patel, DO  nicotine (NICODERM CQ - DOSED IN MG/24 HOURS) 21 mg/24hr patch Place 1 patch (21 mg total) onto the skin daily. 08/24/16   Eugenie Filler, MD  nitroGLYCERIN (NITROSTAT) 0.4 MG SL tablet Place 1 tablet (0.4 mg total) under the tongue every 5 (five) minutes x 3 doses as needed for chest pain. 08/24/16   Eugenie Filler, MD  ondansetron (ZOFRAN) 4 MG tablet Take 1 tablet (4 mg total) by mouth every 6 (six) hours as needed for nausea or vomiting. 08/27/16   Forde Dandy, MD  pantoprazole (PROTONIX) 40 MG tablet Take 1 tablet (40 mg total) by mouth daily as needed. 08/24/16   Eugenie Filler, MD  [place] Stem therapy for overactive bladder implant on the right side.    Historical Provider, MD  SUPER B COMPLEX/C PO Take 1 tablet by mouth every morning.    Historical Provider, MD  traZODone (DESYREL) 50 MG tablet Take 50 mg by mouth at bedtime.      Historical Provider, MD    Family History Family History  Problem Relation Age of Onset  . Hypertension Father   . Heart disease Father   .  Kidney failure Father   . Breast cancer Mother   . Hypertension Mother   . Coronary artery disease Mother   . Alzheimer's disease Mother   . Hypertension Brother   . Hypertension Sister   . Hypertension Daughter     Social History Social History  Substance Use Topics  .  Smoking status: Current Every Day Smoker    Packs/day: 2.00    Years: 30.00  . Smokeless tobacco: Never Used  . Alcohol use No     Comment: OCCASIONAL     Allergies   Statins; Ace inhibitors; Amoxicillin; Midazolam hcl; Nsaids; Prednisone; and Sulfa antibiotics   Review of Systems Review of Systems   Physical Exam Updated Vital Signs BP 125/68   Pulse 64   Temp 98.3 F (36.8 C) (Oral)   Resp 25   Ht 4\' 9"  (1.448 m)   Wt 140 lb (63.5 kg)   LMP 09/06/2005   SpO2 94%   BMI 30.30 kg/m   Physical Exam   ED Treatments / Results  Labs (all labs ordered are listed, but only abnormal results are displayed) Labs Reviewed  BASIC METABOLIC PANEL - Abnormal; Notable for the following:       Result Value   Potassium 3.4 (*)    Glucose, Bld 129 (*)    Calcium 10.6 (*)    All other components within normal limits  CBC - Abnormal; Notable for the following:    WBC 14.7 (*)    RBC 5.43 (*)    Hemoglobin 16.3 (*)    HCT 48.3 (*)    All other components within normal limits  I-STAT TROPOININ, ED    EKG  EKG Interpretation  Date/Time:  Saturday October 14 2016 03:59:28 EDT Ventricular Rate:  146 PR Interval:    QRS Duration: 92 QT Interval:  314 QTC Calculation: 489 R Axis:   102 Text Interpretation:  Supraventricular tachycardia Rightward axis Anterior infarct , age undetermined Abnormal ECG Confirmed by Betsey Holiday  MD, Keola Heninger [phone]) on 10/14/2016 5:22:40 AM       Radiology Dg Chest 2 View  Result Date: 10/14/2016 CLINICAL DATA:  60 year old female with chest pressure. EXAM: CHEST  2 VIEW COMPARISON:  Chest radiograph dated 08/21/2016 FINDINGS: The lungs are clear. There is no  pleural effusion or pneumothorax. The cardiac silhouette is within normal limits. Cervical fusion plate and screws. No acute osseous pathology. Right upper quadrant cholecystectomy clips. IMPRESSION: No active cardiopulmonary disease. Electronically Signed   By: Anner Crete M.D.   On: 10/14/2016 05:38    Procedures Procedures (including critical care time)  Medications Ordered in ED Medications  metoprolol (LOPRESSOR) injection 5 mg (5 mg Intravenous Given 10/14/16 0533)     Initial Impression / Assessment and Plan / ED Course  I have reviewed the triage vital signs and the nursing notes.  Pertinent labs & imaging results that were available during my care of the patient were reviewed by me and considered in my medical decision making (see chart for details).  Clinical Course   Patient presented to the ER with complaints of shortness of breath, tightness in her chest and racing heartbeat. Patient does have a previous history of SVT. Patient was hospitalized with SVT last month and had cardiac catheterization. She did have a 90% OM1 branch and multiple areas of nonobstructive CAD elsewhere. She was to be medically managed. Patient has not been taking her Lopressor because her heart rate dropped into the 40s and her blood pressure dropped into the 90s.  Patient was given IV Lopressor at arrival and this did convert her into a sinus rhythm. She has nonspecific changes on her EKG after conversion to sinus rhythm. Since her conversion, however, she has developed a heaviness and more significant pain across her chest into her back.  This was discussed with Dr. Terrence Dupont,  on call for [name]. He recommends initiating heparin, nitroglycerin and will see the patient in the ER for admission.  CRITICAL CARE Performed by: Orpah Greek   Total critical care time: 30 minutes  Critical care time was exclusive of separately billable procedures and treating other patients.  Critical  care was necessary to treat or prevent imminent or life-threatening deterioration.  Critical care was time spent personally by me on the following activities: development of treatment plan with patient and/or surrogate as well as nursing, discussions with consultants, evaluation of patient's response to treatment, examination of patient, obtaining history from patient or surrogate, ordering and performing treatments and interventions, ordering and review of laboratory studies, ordering and review of radiographic studies, pulse oximetry and re-evaluation of patient's condition.   Final Clinical Impressions(s) / ED Diagnoses   Final diagnoses:  Chest pain, unspecified type  SVT (supraventricular tachycardia) ([place])    New Prescriptions New Prescriptions   No medications on file     Orpah Greek, MD 10/14/16 [phone]

## 2016-10-14 NOTE — H&P (Signed)
[name] is an 60 y.o. female.   Chief Complaint: Palpitations associated with chest pain and shortness of breath HPI: Patient is 60 year old female with past medical history significant for coronary artery disease, hypertension, COPD, tobacco abuse, continues to smoke one to 2 packs per day for last 30+ years, obstructive sleep apnea, hyperlipidemia, came to the ER complaining of palpitation and was started last night around 10:30 PM associated with chest tightness radiating to her jaw associated with mild shortness of breath patient was noted to be in SVT received IV Lopressor with conversion to sinus rhythm. Patient continues to have retrosternal chest tightness/pressure radiating to jaw and back with associated mild shortness of breath and nausea. Patient recently had cardiac catheterization and was noted to have 90% ostial OM 2 lesion and 50-60% proximal RCA stenosis with haziness and was treated medically. Patient had recurrent episodes of SVT in the past and was treated with beta blockers but they were discontinued due to intolerance and bradycardia and hypotension. Patient denies any excessive caffeine soda or alcohol intake. Denies lightheadedness or syncopal episode.  Past Medical History:  Diagnosis Date  . Agoraphobia without history of panic disorder   . Anxiety   . Asthma   . Coronary artery disease CARDIOLOGIST- DR  Doylene Canard  (VISIT 03-30-11 W/ CHART)   NON-OBS. CAD   (STRESS TEST NOV. 2011  . DDD (degenerative disc disease)   . Depression   . DJD (degenerative joint disease)    JOINT PAIN  . Fibromyalgia   . Frequency of urination   . GERD (gastroesophageal reflux disease) AND HIATIAL HERNIA   CONTROLLED W/ NEXIUM  . Hemorrhoids   . History of gastric ulcer 2004  . Hypertension   . IBS (irritable bowel syndrome)   . Incisional pain s/p interstim implant 1st stage--- 12-07-11    left upper buttock-- pt states dressing clean dry and intact (on 12-08-11)  . Insomnia   .  Iron deficiency anemia   . Nocturia   . Non-productive cough   . Numbness in both hands AT TIMES  . OSA on CPAP   . Urge urinary incontinence     Past Surgical History:  Procedure Laterality Date  . CARDIAC CATHETERIZATION  2003  . CARDIAC CATHETERIZATION N/A 08/23/2016   Procedure: Left Heart Cath and Coronary Angiography;  Surgeon: Dixie Dials, MD;  Location: [facility];  Service: Cardiovascular;  Laterality: N/A;  . CERVICAL DISCECTOMY  2002   C4 - 7  . CERVICAL FUSION  2008   C4 - T1  . CHOLECYSTECTOMY  1982  . CYSTO/ HOD/ BLADDER BX  2006  . CYSTO/ HOD/ BLADDER BX/ FULGERATION  06-12-2011  . HYSTEROSCOPY W/D&C  2005  . INTERSTIM IMPLANT PLACEMENT  12/07/2011   Procedure: Barrie Lyme IMPLANT FIRST STAGE;  Surgeon: [name], MD;  Location: [facility];  Service: Urology;  Laterality: Right;  . INTERSTIM IMPLANT PLACEMENT  12/14/2011   Procedure: Barrie Lyme IMPLANT SECOND STAGE;  Surgeon: [name], MD;  Location: [facility];  Service: Urology;  Laterality: Right;  rad tech ok by vickie at [facility]  . LAMINECTOMY  2007   L3 - 5  . RIGHT THUMB SURG.  2001  . TONSILLECTOMY AND ADENOIDECTOMY  1965  . WRIST SURGERY Left    [facility]    Family History  Problem Relation Age of Onset  . Hypertension Father   . Heart disease Father   . Kidney failure Father   .  Breast cancer Mother   . Hypertension Mother   . Coronary artery disease Mother   . Alzheimer's disease Mother   . Hypertension Brother   . Hypertension Sister   . Hypertension Daughter    Social History:  reports that she has been smoking.  She has a 60.00 pack-year smoking history. She has never used smokeless tobacco. She reports that she does not drink alcohol or use drugs.  Allergies:  Allergies  Allergen Reactions  . Midazolam Hcl Other (See Comments)    HYPER  . Prednisone Other (See Comments)    HYPER  . Statins Other (See Comments)    Causes muscle weakness  and joint pain  . [place] Inhibitors Other (See Comments)    DECREASES HEARTRATE  . Amoxicillin Diarrhea  . Nsaids Other (See Comments)    AVOIDS HX GASTRIC ULCER  . Sulfa Antibiotics Rash and Other (See Comments)    JOINT PAIN     (Not in a hospital admission)  Results for orders placed or performed during the hospital encounter of 10/14/16 (from the past 48 hour(s))  Basic metabolic panel     Status: Abnormal   Collection Time: 10/14/16  4:25 AM  Result Value Ref Range   Sodium 141 135 - 145 mmol/L   Potassium 3.4 (L) 3.5 - 5.1 mmol/L   Chloride 107 101 - 111 mmol/L   CO2 25 22 - 32 mmol/L   Glucose, Bld 129 (H) 65 - 99 mg/dL   BUN 17 6 - 20 mg/dL   Creatinine, Ser 0.96 0.44 - 1.00 mg/dL   Calcium 10.6 (H) 8.9 - 10.3 mg/dL   GFR calc non Af Amer >60 >60 mL/min   GFR calc Af Amer >60 >60 mL/min    Comment: (NOTE) The eGFR has been calculated using the CKD EPI equation. This calculation has not been validated in all clinical situations. eGFR's persistently <60 mL/min signify possible Chronic Kidney Disease.    Anion gap 9 5 - 15  CBC     Status: Abnormal   Collection Time: 10/14/16  4:25 AM  Result Value Ref Range   WBC 14.7 (H) 4.0 - 10.5 K/uL   RBC 5.43 (H) 3.87 - 5.11 MIL/uL   Hemoglobin 16.3 (H) 12.0 - 15.0 g/dL   HCT 48.3 (H) 36.0 - 46.0 %   MCV 89.0 78.0 - 100.0 fL   MCH 30.0 26.0 - 34.0 pg   MCHC 33.7 30.0 - 36.0 g/dL   RDW 12.5 11.5 - 15.5 %   Platelets 288 150 - 400 K/uL  I-stat troponin, ED     Status: None   Collection Time: 10/14/16  4:26 AM  Result Value Ref Range   Troponin i, poc 0.01 0.00 - 0.08 ng/mL   Comment 3            Comment: Due to the release kinetics of cTnI, a negative result within the first hours of the onset of symptoms does not rule out myocardial infarction with certainty. If myocardial infarction is still suspected, repeat the test at appropriate intervals.    Dg Chest 2 View  Result Date: 10/14/2016 CLINICAL DATA:   60 year old female with chest pressure. EXAM: CHEST  2 VIEW COMPARISON:  Chest radiograph dated 08/21/2016 FINDINGS: The lungs are clear. There is no pleural effusion or pneumothorax. The cardiac silhouette is within normal limits. Cervical fusion plate and screws. No acute osseous pathology. Right upper quadrant cholecystectomy clips. IMPRESSION: No active cardiopulmonary disease. Electronically Signed   By:  Anner Crete M.D.   On: 10/14/2016 05:38    Review of Systems  Constitutional: Negative for chills and fever.  Eyes: Negative for double vision.  Respiratory: Positive for shortness of breath.   Cardiovascular: Positive for chest pain and palpitations. Negative for leg swelling.  Gastrointestinal: Positive for nausea.  Genitourinary: Negative for dysuria.  Neurological: Negative for dizziness.    Blood pressure 141/73, pulse 70, temperature 98.2 F (36.8 C), temperature source Oral, resp. rate 23, height _0  (1.448 m), weight 140 lb (63.5 kg), last menstrual period 09/06/2005, SpO2 93 %. Physical Exam  Constitutional: She is oriented to person, place, and time.  HENT:  Head: Normocephalic and atraumatic.  Eyes: Conjunctivae are normal. Left eye exhibits no discharge. No scleral icterus.  Neck: Normal range of motion. Neck supple. No JVD present. No tracheal deviation present. No thyromegaly present.  Cardiovascular: Normal rate, regular rhythm and normal heart sounds.   No murmur heard. Respiratory: Effort normal and breath sounds normal. No respiratory distress. She has no wheezes. She has no rales.  GI: Soft. Bowel sounds are normal.  Musculoskeletal: She exhibits no edema, tenderness or deformity.  Neurological: She is alert and oriented to person, place, and time.     Assessment/Plan Unstable angina rule out MI Status post SVT Hypertension COPD Obstructive sleep apnea Tobacco abuse Hyperlipidemia Plan As per orders   Charolette Forward, MD 10/14/2016, 8:43  AM

## 2016-10-14 NOTE — ED Notes (Signed)
PAGED [facility] /CARDIOLOGY TO [facility]

## 2016-10-14 NOTE — ED Notes (Signed)
Attempted to call report

## 2016-10-14 NOTE — Progress Notes (Addendum)
[place] for Heparin Indication: chest pain/ACS  Allergies  Allergen Reactions  . Midazolam Hcl Other (See Comments)    HYPER  . Prednisone Other (See Comments)    HYPER  . Statins Other (See Comments)    Causes muscle weakness and joint pain  . Ace Inhibitors Other (See Comments)    DECREASES HEARTRATE  . Amoxicillin Diarrhea  . Nsaids Other (See Comments)    AVOIDS HX GASTRIC ULCER  . Sulfa Antibiotics Rash and Other (See Comments)    JOINT PAIN    Patient Measurements: Height: 4\' 9"  (144.8 cm) Weight: 140 lb (63.5 kg) IBW/kg (Calculated) : 38.6 Heparin Dosing Weight: 55 kg  Vital Signs: Temp: 98.3 F (36.8 C) (10/28 1121) Temp Source: Oral (10/28 1121) BP: 151/64 (10/28 1121) Pulse Rate: 70 (10/28 1045)  Labs:  Recent Labs  10/14/16 0425  HGB 16.3*  HCT 48.3*  PLT 288  CREATININE 0.96    Estimated Creatinine Clearance: 46.6 mL/min (by C-G formula based on SCr of 0.96 mg/dL).   Medical History: Past Medical History:  Diagnosis Date  . Agoraphobia without history of panic disorder   . Anxiety   . Asthma   . Coronary artery disease CARDIOLOGIST- DR  Doylene Canard  (VISIT 03-30-11 W/ CHART)   NON-OBS. CAD   (STRESS TEST NOV. 2011  . DDD (degenerative disc disease)   . Depression   . DJD (degenerative joint disease)    JOINT PAIN  . Fibromyalgia   . Frequency of urination   . GERD (gastroesophageal reflux disease) AND HIATIAL HERNIA   CONTROLLED W/ NEXIUM  . Hemorrhoids   . History of gastric ulcer 2004  . Hypertension   . IBS (irritable bowel syndrome)   . Incisional pain s/p interstim implant 1st stage--- 12-07-11    left upper buttock-- pt states dressing clean dry and intact (on 12-08-11)  . Insomnia   . Iron deficiency anemia   . Nocturia   . Non-productive cough   . Numbness in both hands AT TIMES  . OSA on CPAP   . Urge urinary incontinence     Medications:  No current facility-administered  medications on file prior to encounter.    Current Outpatient Prescriptions on File Prior to Encounter  Medication Sig Dispense Refill  . albuterol (PROVENTIL HFA;VENTOLIN HFA) 108 (90 BASE) MCG/ACT inhaler Inhale 2 puffs into the lungs every 6 (six) hours as needed for wheezing or shortness of breath.    Marland Kitchen amLODipine (NORVASC) 5 MG tablet Take 5 mg by mouth every morning.      . Ascorbic Acid (VITAMIN C) 1000 MG tablet Take 1,000 mg by mouth daily.    Marland Kitchen aspirin EC 325 MG tablet Take 325 mg by mouth every morning.     Marland Kitchen buPROPion (WELLBUTRIN XL) 150 MG 24 hr tablet Take 150 mg by mouth daily.     . cholecalciferol (VITAMIN D) 1000 UNITS tablet Take 1,000 Units by mouth every morning.     . clonazePAM (KLONOPIN) 1 MG tablet Take 1 mg by mouth 2 (two) times daily.  5  . DULoxetine (CYMBALTA) 60 MG capsule Take 60 mg by mouth at bedtime.     . fenofibrate (TRICOR) 48 MG tablet Take 48 mg by mouth every morning.   6  . ferrous sulfate 325 (65 FE) MG tablet Take 325 mg by mouth daily with breakfast.    . HYDROcodone-acetaminophen (NORCO) 5-325 MG per tablet Take 1 tablet by mouth 2 (two)  times daily as needed for pain.     . isosorbide mononitrate (IMDUR) 60 MG 24 hr tablet Take 1 tablet (60 mg total) by mouth daily. 90 tablet 3  . loperamide (IMODIUM) 2 MG capsule Take 1 capsule (2 mg total) by mouth 4 (four) times daily as needed for diarrhea or loose stools. 12 capsule 0  . meclizine (ANTIVERT) 12.5 MG tablet TAKE 1 TABLET 3 TIMES A DAY AS NEEDED FOR DIZZYNESS  0  . methocarbamol (ROBAXIN) 500 MG tablet Take 1 tablet (500 mg total) by mouth every 8 (eight) hours as needed (for muscle spasms). 45 tablet 3  . ondansetron (ZOFRAN) 4 MG tablet Take 1 tablet (4 mg total) by mouth every 6 (six) hours as needed for nausea or vomiting. 12 tablet 0  . pantoprazole (PROTONIX) 40 MG tablet Take 1 tablet (40 mg total) by mouth daily as needed. (Patient taking differently: Take 40 mg by mouth daily as needed  (for acid reflux). ) 20 tablet 0  . SUPER B COMPLEX/C PO Take 1 tablet by mouth every morning.    . traZODone (DESYREL) 50 MG tablet Take 50 mg by mouth at bedtime.      . nicotine (NICODERM CQ - DOSED IN MG/24 HOURS) 21 mg/24hr patch Place 1 patch (21 mg total) onto the skin daily. (Patient not taking: Reported on 10/14/2016) 28 patch 0  . nitroGLYCERIN (NITROSTAT) 0.4 MG SL tablet Place 1 tablet (0.4 mg total) under the tongue every 5 (five) minutes x 3 doses as needed for chest pain. 10 tablet 0     Assessment: 60 y.o. female with chest pain continuing on heparin for UA. Not on anticoagulation PTA. CBC ok.  Initial heparin level subtherapeutic (0.11) on 750 units/h. No bleed or IV line issues per RN.  Goal of Therapy:  Heparin level 0.3-0.7 units/ml Monitor platelets by anticoagulation protocol: Yes   Plan:  Increase heparin to 950 units/h 6h heparin level Daily heparin level/CBC Monitor for s/sx bleeding    Elicia Lamp, PharmD, BCPS Clinical Pharmacist 10/14/2016 11:57 AM    ADDENDUM:  Heparin level remains subtherapeutic (0.23) on rate increase. No issues with IV line or bleeding per RN.  Plan: Increase heparin to 1100 units/h 6h heparin level Daily heparin level/CBC Monitor for s/sx bleeding   Elicia Lamp, PharmD, BCPS Clinical Pharmacist 10/14/2016 10:05 PM

## 2016-10-14 NOTE — ED Notes (Addendum)
Heparin called for from pharmacy.

## 2016-10-14 NOTE — Progress Notes (Signed)
Pt refuses her Lopressor and stated that it will drop her blood pressor and her heart rate. She stated that she took it in the ED but it was just 5 mg IV.

## 2016-10-14 NOTE — ED Triage Notes (Signed)
Pt. reports palpitations with mild SOB and chest tightness onset last night , denies nausea or diaphoresis , her cardiologist is [name] .

## 2016-10-14 NOTE — ED Notes (Signed)
Paged Cardiology for [name] to [facility]

## 2016-10-15 ENCOUNTER — Observation Stay (HOSPITAL_COMMUNITY): Payer: Medicare Other

## 2016-10-15 DIAGNOSIS — Z8249 Family history of ischemic heart disease and other diseases of the circulatory system: Secondary | ICD-10-CM | POA: Diagnosis not present

## 2016-10-15 DIAGNOSIS — F17290 Nicotine dependence, other tobacco product, uncomplicated: Secondary | ICD-10-CM | POA: Diagnosis not present

## 2016-10-15 DIAGNOSIS — E785 Hyperlipidemia, unspecified: Secondary | ICD-10-CM | POA: Diagnosis present

## 2016-10-15 DIAGNOSIS — J449 Chronic obstructive pulmonary disease, unspecified: Secondary | ICD-10-CM | POA: Diagnosis present

## 2016-10-15 DIAGNOSIS — I10 Essential (primary) hypertension: Secondary | ICD-10-CM | POA: Diagnosis present

## 2016-10-15 DIAGNOSIS — M797 Fibromyalgia: Secondary | ICD-10-CM | POA: Diagnosis present

## 2016-10-15 DIAGNOSIS — J440 Chronic obstructive pulmonary disease with acute lower respiratory infection: Secondary | ICD-10-CM | POA: Diagnosis present

## 2016-10-15 DIAGNOSIS — E876 Hypokalemia: Secondary | ICD-10-CM | POA: Diagnosis present

## 2016-10-15 DIAGNOSIS — R079 Chest pain, unspecified: Secondary | ICD-10-CM | POA: Diagnosis not present

## 2016-10-15 DIAGNOSIS — Z820 Family history of epilepsy and other diseases of the nervous system: Secondary | ICD-10-CM | POA: Diagnosis not present

## 2016-10-15 DIAGNOSIS — K219 Gastro-esophageal reflux disease without esophagitis: Secondary | ICD-10-CM | POA: Diagnosis present

## 2016-10-15 DIAGNOSIS — Z7982 Long term (current) use of aspirin: Secondary | ICD-10-CM | POA: Diagnosis not present

## 2016-10-15 DIAGNOSIS — R002 Palpitations: Secondary | ICD-10-CM | POA: Diagnosis not present

## 2016-10-15 DIAGNOSIS — F17200 Nicotine dependence, unspecified, uncomplicated: Secondary | ICD-10-CM | POA: Diagnosis present

## 2016-10-15 DIAGNOSIS — Z881 Allergy status to other antibiotic agents status: Secondary | ICD-10-CM | POA: Diagnosis not present

## 2016-10-15 DIAGNOSIS — Z888 Allergy status to other drugs, medicaments and biological substances status: Secondary | ICD-10-CM | POA: Diagnosis not present

## 2016-10-15 DIAGNOSIS — Z882 Allergy status to sulfonamides status: Secondary | ICD-10-CM | POA: Diagnosis not present

## 2016-10-15 DIAGNOSIS — Z841 Family history of disorders of kidney and ureter: Secondary | ICD-10-CM | POA: Diagnosis not present

## 2016-10-15 DIAGNOSIS — Z720 Tobacco use: Secondary | ICD-10-CM | POA: Diagnosis not present

## 2016-10-15 DIAGNOSIS — I25110 Atherosclerotic heart disease of native coronary artery with unstable angina pectoris: Secondary | ICD-10-CM | POA: Diagnosis present

## 2016-10-15 DIAGNOSIS — I200 Unstable angina: Secondary | ICD-10-CM | POA: Diagnosis not present

## 2016-10-15 DIAGNOSIS — Z803 Family history of malignant neoplasm of breast: Secondary | ICD-10-CM | POA: Diagnosis not present

## 2016-10-15 DIAGNOSIS — F17210 Nicotine dependence, cigarettes, uncomplicated: Secondary | ICD-10-CM | POA: Diagnosis not present

## 2016-10-15 DIAGNOSIS — F4002 Agoraphobia without panic disorder: Secondary | ICD-10-CM | POA: Diagnosis present

## 2016-10-15 DIAGNOSIS — I471 Supraventricular tachycardia: Secondary | ICD-10-CM | POA: Diagnosis present

## 2016-10-15 DIAGNOSIS — Z886 Allergy status to analgesic agent status: Secondary | ICD-10-CM | POA: Diagnosis not present

## 2016-10-15 LAB — BASIC METABOLIC PANEL
Anion gap: 9 (ref 5–15)
BUN: 12 mg/dL (ref 6–20)
CHLORIDE: 109 mmol/L (ref 101–111)
CO2: 24 mmol/L (ref 22–32)
CREATININE: 0.91 mg/dL (ref 0.44–1.00)
Calcium: 9.1 mg/dL (ref 8.9–10.3)
GFR calc non Af Amer: >60 mL/min (ref >60)
Glucose, Bld: 99 mg/dL (ref 65–99)
POTASSIUM: 3.7 mmol/L (ref 3.5–5.1)
Sodium: 142 mmol/L (ref 135–145)

## 2016-10-15 LAB — CBC
HEMATOCRIT: 43.4 % (ref 36.0–46.0)
HEMOGLOBIN: 14.1 g/dL (ref 12.0–15.0)
MCH: 29.1 pg (ref 26.0–34.0)
MCHC: 32.5 g/dL (ref 30.0–36.0)
MCV: 89.7 fL (ref 78.0–100.0)
Platelets: 223 10*3/uL (ref 150–400)
RBC: 4.84 MIL/uL (ref 3.87–5.11)
RDW: 12.6 % (ref 11.5–15.5)
WBC: 10.4 10*3/uL (ref 4.0–10.5)

## 2016-10-15 LAB — LIPID PANEL
CHOL/HDL RATIO: 3.9 ratio
CHOLESTEROL: 154 mg/dL (ref 0–200)
HDL: 39 mg/dL — ABNORMAL LOW (ref >40)
LDL CALC: 90 mg/dL (ref 0–99)
TRIGLYCERIDES: 127 mg/dL (ref <150)
VLDL: 25 mg/dL (ref 0–40)

## 2016-10-15 LAB — HEPARIN LEVEL (UNFRACTIONATED)
Heparin Unfractionated: 0.39 IU/mL (ref 0.30–0.70)
Heparin Unfractionated: 0.45 IU/mL (ref 0.30–0.70)

## 2016-10-15 LAB — TROPONIN I

## 2016-10-15 MED ORDER — TECHNETIUM TC 99M TETROFOSMIN IV KIT
30.0000 | PACK | Freq: Once | INTRAVENOUS | Status: AC | PRN
  Administered 2016-10-15: 30 via INTRAVENOUS

## 2016-10-15 MED ORDER — TECHNETIUM TC 99M TETROFOSMIN IV KIT
10.0000 | PACK | Freq: Once | INTRAVENOUS | Status: AC | PRN
  Administered 2016-10-15: 10 via INTRAVENOUS

## 2016-10-15 MED ORDER — REGADENOSON 0.4 MG/5ML IV SOLN
0.4000 mg | Freq: Once | INTRAVENOUS | Status: AC
Start: 2016-10-15 — End: 2016-10-15
  Administered 2016-10-15: 0.4 mg via INTRAVENOUS
  Filled 2016-10-15: qty 5

## 2016-10-15 MED ORDER — REGADENOSON 0.4 MG/5ML IV SOLN
INTRAVENOUS | Status: AC
  Filled 2016-10-15: qty 5

## 2016-10-15 MED ORDER — ACETAMINOPHEN 325 MG PO TABS
ORAL_TABLET | ORAL | Status: AC
  Filled 2016-10-15: qty 2

## 2016-10-15 NOTE — Progress Notes (Signed)
Subjective:  Patient complaining of retrosternal chest pain early this morning lasting few minutes without associated symptoms cardiac enzymes have been negative no further episodes of SVT. Patient did not take any metoprolol yesterday because of fear of bradycardia and hypotension.  Objective:  Vital Signs in the last 24 hours: Temp:  [98.1 F (36.7 C)-98.6 F (37 C)] 98.1 F (36.7 C) (10/29 0413) Pulse Rate:  [66-102] 93 (10/29 1148) Resp:  [18-19] 18 (10/29 0804) BP: (123-177)/(47-85) 175/66 (10/29 1148) SpO2:  [92 %-96 %] 96 % (10/29 0804)  Intake/Output from previous day: 10/28 0701 - 10/29 0700 In: 1269.4 [P.O.:236; I.V.:1033.4] Out: -  Intake/Output from this shift: No intake/output data recorded.  Physical Exam: Neck: no adenopathy, no carotid bruit, no JVD and supple, symmetrical, trachea midline Lungs: clear to auscultation bilaterally Heart: regular rate and rhythm, S1, S2 normal and Soft systolic murmur noted Abdomen: soft, non-tender; bowel sounds normal; no masses,  no organomegaly Extremities: extremities normal, atraumatic, no cyanosis or edema  Lab Results:  Recent Labs  10/14/16 0425 10/15/16 0405  WBC 14.7* 10.4  HGB 16.3* 14.1  PLT 288 223    Recent Labs  10/14/16 0425 10/15/16 0405  NA 141 142  K 3.4* 3.7  CL 107 109  CO2 25 24  GLUCOSE 129* 99  BUN 17 12  CREATININE 0.96 0.91    Recent Labs  10/14/16 1622 10/15/16 0017  TROPONINI <0.03 <0.03   Hepatic Function Panel No results for input(s): PROT, ALBUMIN, AST, ALT, ALKPHOS, BILITOT, BILIDIR, IBILI in the last 72 hours.  Recent Labs  10/15/16 0405  CHOL 154   No results for input(s): PROTIME in the last 72 hours.  Imaging: Imaging results have been reviewed and Dg Chest 2 View  Result Date: 10/14/2016 CLINICAL DATA:  60 year old female with chest pressure. EXAM: CHEST  2 VIEW COMPARISON:  Chest radiograph dated 08/21/2016 FINDINGS: The lungs are clear. There is no pleural  effusion or pneumothorax. The cardiac silhouette is within normal limits. Cervical fusion plate and screws. No acute osseous pathology. Right upper quadrant cholecystectomy clips. IMPRESSION: No active cardiopulmonary disease. Electronically Signed   By: Anner Crete M.D.   On: 10/14/2016 05:38    Cardiac Studies:  Assessment/Plan:  Unstable angina MI ruled out Status post SVT Hypertension COPD Obstructive sleep apnea Tobacco abuse Hyperlipidemia Plan Restart metoprolol 12.5 mg twice daily Schedule for Lexiscan Myoview today   LOS: 0 days    Charolette Forward 10/15/2016, 11:55 AM

## 2016-10-15 NOTE — Progress Notes (Signed)
Spoke with Dr. Terrence Dupont this AM regarding discontinuation of IV nitro for transport to nuclear medicine for Stress test this AM. He stated Ok to turn off for transport down to nuclear medicine for stress test.  Will continue to monitor patient. Bay Jarquin, [employer]

## 2016-10-15 NOTE — Progress Notes (Signed)
[place] for Heparin Indication: chest pain/ACS  Allergies  Allergen Reactions  . Midazolam Hcl Other (See Comments)    HYPER  . Prednisone Other (See Comments)    HYPER  . Statins Other (See Comments)    Causes muscle weakness and joint pain  . Ace Inhibitors Other (See Comments)    DECREASES HEARTRATE  . Amoxicillin Diarrhea  . Nsaids Other (See Comments)    AVOIDS HX GASTRIC ULCER  . Sulfa Antibiotics Rash and Other (See Comments)    JOINT PAIN    Patient Measurements: Height: 4\' 9"  (144.8 cm) Weight: 140 lb (63.5 kg) IBW/kg (Calculated) : 38.6 Heparin Dosing Weight: 55 kg  Vital Signs: Temp: 98.1 F (36.7 C) (10/29 0413) Temp Source: Oral (10/29 0413) BP: 176/71 (10/29 1407) Pulse Rate: 98 (10/29 1407)  Labs:  Recent Labs  10/14/16 0425 10/14/16 1215  10/14/16 1622 10/14/16 2120 10/15/16 0017 10/15/16 0405 10/15/16 1358  HGB 16.3*  --   --   --   --   --  14.1  --   HCT 48.3*  --   --   --   --   --  43.4  --   PLT 288  --   --   --   --   --  223  --   HEPARINUNFRC  --   --   < >  --  0.23*  --  0.39 0.45  CREATININE 0.96  --   --   --   --   --  0.91  --   TROPONINI  --  <0.03  --  <0.03  --  <0.03  --   --   < > = values in this interval not displayed.  Estimated Creatinine Clearance: 49.2 mL/min (by C-G formula based on SCr of 0.91 mg/dL).   Assessment: 60 y.o. female with chest pain continuing on heparin for UA. Not on anticoagulation PTA. CBC ok. Heparin level now remains therapeutic. No bleeding noted.   Goal of Therapy:  Heparin level 0.3-0.7 units/ml Monitor platelets by anticoagulation protocol: Yes   Plan:  Continue heparin at 1100 units/h Daily heparin level/CBC Monitor for s/sx bleeding    Salome Arnt, PharmD, BCPS Pager # [phone] 10/15/2016 3:11 PM

## 2016-10-15 NOTE — Progress Notes (Signed)
ANTICOAGULATION CONSULT NOTE - Follow Up Consult  Pharmacy Consult for Heparin Indication: chest pain/ACS  Patient Measurements: Height: 4\' 9"  (144.8 cm) Weight: 140 lb (63.5 kg) IBW/kg (Calculated) : 38.6  Vital Signs: Temp: 98.1 F (36.7 C) (10/29 0413) Temp Source: Oral (10/29 0413) BP: 146/70 (10/29 0413) Pulse Rate: 74 (10/29 0413)  Labs:  Recent Labs  10/14/16 0425 10/14/16 1215 10/14/16 1347 10/14/16 1622 10/14/16 2120 10/15/16 0017 10/15/16 0405  HGB 16.3*  --   --   --   --   --  14.1  HCT 48.3*  --   --   --   --   --  43.4  PLT 288  --   --   --   --   --  223  HEPARINUNFRC  --   --  0.11*  --  0.23*  --  0.39  CREATININE 0.96  --   --   --   --   --  0.91  TROPONINI  --  <0.03  --  <0.03  --  <0.03  --     Estimated Creatinine Clearance: 49.2 mL/min (by C-G formula based on SCr of 0.91 mg/dL).   Assessment: Therapeutic heparin level x 1 after rate increase  Goal of Therapy:  Heparin level 0.3-0.7 units/ml Monitor platelets by anticoagulation protocol: Yes   Plan:  -Cont heparin at 1100 units/hr -1200 HL  Lismary Kiehn 10/15/2016,5:20 AM

## 2016-10-16 LAB — CBC
HEMATOCRIT: 42.1 % (ref 36.0–46.0)
HEMOGLOBIN: 13.8 g/dL (ref 12.0–15.0)
MCH: 29.4 pg (ref 26.0–34.0)
MCHC: 32.8 g/dL (ref 30.0–36.0)
MCV: 89.8 fL (ref 78.0–100.0)
Platelets: 214 10*3/uL (ref 150–400)
RBC: 4.69 MIL/uL (ref 3.87–5.11)
RDW: 12.5 % (ref 11.5–15.5)
WBC: 12.2 10*3/uL — ABNORMAL HIGH (ref 4.0–10.5)

## 2016-10-16 LAB — HEPARIN LEVEL (UNFRACTIONATED): Heparin Unfractionated: 0.45 IU/mL (ref 0.30–0.70)

## 2016-10-16 MED ORDER — NICOTINE 21 MG/24HR TD PT24
21.0000 mg | MEDICATED_PATCH | Freq: Every day | TRANSDERMAL | Status: DC
  Administered 2016-10-16 – 2016-10-17 (×2): 21 mg via TRANSDERMAL
  Filled 2016-10-16 (×2): qty 1

## 2016-10-16 MED ORDER — CLONAZEPAM 1 MG PO TABS
1.0000 mg | ORAL_TABLET | Freq: Two times a day (BID) | ORAL | Status: DC
  Administered 2016-10-16 – 2016-10-17 (×3): 1 mg via ORAL
  Filled 2016-10-16 (×3): qty 1

## 2016-10-16 MED ORDER — VITAMIN C 500 MG PO TABS
1000.0000 mg | ORAL_TABLET | Freq: Every day | ORAL | Status: DC
  Administered 2016-10-16 – 2016-10-17 (×2): 1000 mg via ORAL
  Filled 2016-10-16 (×2): qty 2

## 2016-10-16 MED ORDER — GUAIFENESIN ER 600 MG PO TB12
600.0000 mg | ORAL_TABLET | Freq: Two times a day (BID) | ORAL | Status: DC
  Administered 2016-10-16 – 2016-10-17 (×3): 600 mg via ORAL
  Filled 2016-10-16 (×3): qty 1

## 2016-10-16 MED ORDER — ALBUTEROL SULFATE (2.5 MG/3ML) 0.083% IN NEBU: 2.5000 mg | INHALATION_SOLUTION | Freq: Four times a day (QID) | RESPIRATORY_TRACT | Status: DC | PRN

## 2016-10-16 MED ORDER — ONDANSETRON HCL 4 MG PO TABS: 4.0000 mg | ORAL_TABLET | Freq: Four times a day (QID) | ORAL | Status: DC | PRN

## 2016-10-16 MED ORDER — LEVALBUTEROL HCL 0.63 MG/3ML IN NEBU: 0.6300 mg | INHALATION_SOLUTION | Freq: Four times a day (QID) | RESPIRATORY_TRACT | Status: DC | PRN

## 2016-10-16 MED ORDER — AZITHROMYCIN 250 MG PO TABS
250.0000 mg | ORAL_TABLET | Freq: Every day | ORAL | Status: DC
  Administered 2016-10-17: 250 mg via ORAL
  Filled 2016-10-16: qty 1

## 2016-10-16 MED ORDER — ISOSORBIDE MONONITRATE ER 30 MG PO TB24
30.0000 mg | ORAL_TABLET | Freq: Every day | ORAL | Status: DC
  Administered 2016-10-16 – 2016-10-17 (×2): 30 mg via ORAL
  Filled 2016-10-16 (×2): qty 1

## 2016-10-16 MED ORDER — FERROUS SULFATE 325 (65 FE) MG PO TABS
325.0000 mg | ORAL_TABLET | Freq: Every day | ORAL | Status: DC
  Administered 2016-10-17: 325 mg via ORAL
  Filled 2016-10-16: qty 1

## 2016-10-16 MED ORDER — METOPROLOL TARTRATE 12.5 MG HALF TABLET
12.5000 mg | ORAL_TABLET | Freq: Every day | ORAL | Status: DC
  Administered 2016-10-16: 12.5 mg via ORAL
  Filled 2016-10-16: qty 1

## 2016-10-16 MED ORDER — AMLODIPINE BESYLATE 5 MG PO TABS
2.5000 mg | ORAL_TABLET | Freq: Every day | ORAL | Status: DC
  Administered 2016-10-16: 2.5 mg via ORAL
  Filled 2016-10-16: qty 1

## 2016-10-16 MED ORDER — AZITHROMYCIN 500 MG PO TABS
500.0000 mg | ORAL_TABLET | Freq: Every day | ORAL | Status: AC
  Administered 2016-10-16: 500 mg via ORAL
  Filled 2016-10-16: qty 1

## 2016-10-16 MED ORDER — VITAMIN D 1000 UNITS PO TABS
1000.0000 [IU] | ORAL_TABLET | Freq: Every morning | ORAL | Status: DC
  Administered 2016-10-16 – 2016-10-17 (×2): 1000 [IU] via ORAL
  Filled 2016-10-16 (×2): qty 1

## 2016-10-16 MED ORDER — ENOXAPARIN SODIUM 40 MG/0.4ML ~~LOC~~ SOLN
40.0000 mg | Freq: Two times a day (BID) | SUBCUTANEOUS | Status: DC
  Administered 2016-10-16 – 2016-10-17 (×3): 40 mg via SUBCUTANEOUS
  Filled 2016-10-16 (×3): qty 0.4

## 2016-10-16 NOTE — Consult Note (Signed)
   [facility] CM Inpatient Consult   10/16/2016  KISTA HUSCHKA [birth year] HA:[id number]   Patient evaluated for long-term disease management services with [place] [facility] Management program. Martin Majestic to bedside to discuss and offer [place] Management services. Ms. Bottari is agreeable and written consent signed. Explained to patient that she will receive post hospital transition of care calls and will be evaluated for monthly home visits. Confirmed Primary Care MD as Dr. Maurice Small.  Confirmed best contact number as [phone].   Denies having trouble obtaining medications or with transportation. Lives with her daughter and granddaughter.  Left [facility] Care Management packet and contact information at bedside. Will make inpatient RNCM aware that patient will be followed by [place] Management post hospital discharge.  Will request to be assigned to [place] for transition of care. Has has x2 hospital admissions in past 6 months. Has history of COPD, HLD, HTN.    Marthenia Rolling, MSN-Ed, RN,BSN [facility] Liaison [phone]

## 2016-10-16 NOTE — Progress Notes (Signed)
Ref: [name], MD   Subjective:  Increased non-productive cough. Increased anxiety. Afebrile.  Objective:  Vital Signs in the last 24 hours: Temp:  [98.1 F (36.7 C)-98.4 F (36.9 C)] 98.1 F (36.7 C) (10/30 0537) Pulse Rate:  [56-102] 56 (10/30 0537) Cardiac Rhythm: Sinus bradycardia (10/29 1900) Resp:  [18-20] 18 (10/30 0537) BP: (127-177)/(53-85) 136/53 (10/30 0537) SpO2:  [95 %-97 %] 95 % (10/30 0736)  Physical Exam: BP Readings from Last 1 Encounters:  10/16/16 (!) 136/53    Wt Readings from Last 1 Encounters:  10/14/16 63.5 kg (140 lb)    Weight change:  Body mass index is 30.3 kg/m. HEENT: [place]/AT, Eyes-Blue, PERL, EOMI, Conjunctiva-Pink, Sclera-Non-icteric Neck: No JVD, No bruit, Trachea midline. Lungs:  Clear, Bilateral. Forced expiratory wheeze. Cardiac:  Regular rhythm, normal S1 and S2, no S3. II/VI systolic murmur. Abdomen:  Soft, non-tender. Extremities:  No edema present. No cyanosis. No clubbing. CNS: AxOx3, Cranial nerves grossly intact, moves all 4 extremities.  Skin: Warm and dry.   Intake/Output from previous day: 10/29 0701 - 10/30 0700 In: 480 [P.O.:480] Out: 3 [Urine:2; Stool:1]    Lab Results: BMET    Component Value Date/Time   NA 142 10/15/2016 0405   NA 141 10/14/2016 0425   NA 136 08/27/2016 1630   NA 142 10/11/2015 0905   NA 141 05/27/2015 0906   NA 140 05/26/2014 1039   K 3.7 10/15/2016 0405   K 3.4 (L) 10/14/2016 0425   K 4.6 08/27/2016 1630   K 3.4 (L) 10/11/2015 0905   K 4.2 05/27/2015 0906   K 3.5 05/26/2014 1039   CL 109 10/15/2016 0405   CL 107 10/14/2016 0425   CL 102 08/27/2016 1630   CO2 24 10/15/2016 0405   CO2 25 10/14/2016 0425   CO2 26 08/27/2016 1630   CO2 28 10/11/2015 0905   CO2 27 05/27/2015 0906   CO2 29 05/26/2014 1039   GLUCOSE 99 10/15/2016 0405   GLUCOSE 129 (H) 10/14/2016 0425   GLUCOSE 105 (H) 08/27/2016 1630   GLUCOSE 118 10/11/2015 0905   GLUCOSE 116 05/27/2015 0906   GLUCOSE 115  05/26/2014 1039   BUN 12 10/15/2016 0405   BUN 17 10/14/2016 0425   BUN 21 (H) 08/27/2016 1630   BUN 13.2 10/11/2015 0905   BUN 17.4 05/27/2015 0906   BUN 12.9 05/26/2014 1039   CREATININE 0.91 10/15/2016 0405   CREATININE 0.96 10/14/2016 0425   CREATININE 1.16 (H) 08/27/2016 1630   CREATININE 1.0 10/11/2015 0905   CREATININE 1.2 (H) 05/27/2015 0906   CREATININE 0.9 05/26/2014 1039   CALCIUM 9.1 10/15/2016 0405   CALCIUM 10.6 (H) 10/14/2016 0425   CALCIUM 10.3 08/27/2016 1630   CALCIUM 10.0 10/11/2015 0905   CALCIUM 9.5 05/27/2015 0906   CALCIUM 9.4 05/26/2014 1039   GFRNONAA >60 10/15/2016 0405   GFRNONAA >60 10/14/2016 0425   GFRNONAA 49 (L) 08/27/2016 1630   GFRAA >60 10/15/2016 0405   GFRAA >60 10/14/2016 0425   GFRAA 57 (L) 08/27/2016 1630   CBC    Component Value Date/Time   WBC 12.2 (H) 10/16/2016 0235   RBC 4.69 10/16/2016 0235   HGB 13.8 10/16/2016 0235   HGB 14.9 10/11/2015 0905   HCT 42.1 10/16/2016 0235   HCT 44.5 10/11/2015 0905   PLT 214 10/16/2016 0235   PLT 279 10/11/2015 0905   MCV 89.8 10/16/2016 0235   MCV 88.0 10/11/2015 0905   MCH 29.4 10/16/2016 0235  MCHC 32.8 10/16/2016 0235   RDW 12.5 10/16/2016 0235   RDW 13.0 10/11/2015 0905   LYMPHSABS 2.5 08/27/2016 1630   LYMPHSABS 1.6 10/11/2015 0905   MONOABS 0.9 08/27/2016 1630   MONOABS 0.8 10/11/2015 0905   EOSABS 0.3 08/27/2016 1630   EOSABS 0.3 10/11/2015 0905   BASOSABS 0.1 08/27/2016 1630   BASOSABS 0.1 10/11/2015 0905   HEPATIC Function Panel No results for input(s): PROT in the last 8760 hours.  Invalid input(s):  ALBUMIN,  AST,  ALT,  ALKPHOS,  BILIDIR,  IBILI HEMOGLOBIN A1C No components found for: HGA1C,  MPG CARDIAC ENZYMES Lab Results  Component Value Date   TROPONINI <0.03 10/15/2016   TROPONINI <0.03 10/14/2016   TROPONINI <0.03 10/14/2016   BNP No results for input(s): PROBNP in the last 8760 hours. TSH  Recent Labs  08/22/16 0351 10/14/16 1215  TSH 2.935 1.803    CHOLESTEROL  Recent Labs  08/22/16 0351 10/15/16 0405  CHOL 160 154    Scheduled Meds: . amLODipine  2.5 mg Oral BH-q7a  . aspirin EC  81 mg Oral Daily  . azithromycin  500 mg Oral Daily   Followed by  . [START ON 10/17/2016] azithromycin  250 mg Oral Daily  . buPROPion  150 mg Oral Daily  . cholecalciferol  1,000 Units Oral q morning - 10a  . clonazePAM  1 mg Oral BID  . DULoxetine  60 mg Oral QHS  . enoxaparin (LOVENOX) injection  40 mg Subcutaneous Q12H  . fenofibrate  160 mg Oral Daily  . [START ON 10/17/2016] ferrous sulfate  325 mg Oral Q breakfast  . isosorbide mononitrate  30 mg Oral Daily  . levalbuterol  0.63 mg Nebulization TID  . metoprolol tartrate  12.5 mg Oral QHS  . nicotine  21 mg Transdermal Daily  . pantoprazole  40 mg Oral Daily  . traZODone  50 mg Oral QHS  . vitamin C  1,000 mg Oral Daily   Continuous Infusions: . sodium chloride 50 mL/hr at 10/15/16 0501   PRN Meds:.acetaminophen, albuterol, nitroGLYCERIN, ondansetron (ZOFRAN) IV, ondansetron  Assessment/Plan: Chest pain Acute exacerbation of chronic bronchitis S/P SVT Hypertension COPD Obstructive sleep apnea Tobacco abuse Hyperlipidemia  Resume lower dose amlodipine and isosorbide. Decrease Metoprolol use. Add z-pak, Albuterol inhaler, Mucinex, Clonazepam and nicotine patch.   LOS: 1 day    Dixie Dials  MD  10/16/2016, 8:25 AM

## 2016-10-17 MED ORDER — DILTIAZEM HCL ER COATED BEADS 120 MG PO CP24: 120.0000 mg | ORAL_CAPSULE | Freq: Every day | ORAL | 3 refills | Status: DC

## 2016-10-17 MED ORDER — AZITHROMYCIN 250 MG PO TABS: ORAL_TABLET | ORAL | 0 refills | Status: DC

## 2016-10-17 MED ORDER — ASPIRIN 81 MG PO TBEC: 81.0000 mg | DELAYED_RELEASE_TABLET | Freq: Every day | ORAL | 3 refills | Status: DC

## 2016-10-17 MED ORDER — ISOSORBIDE MONONITRATE ER 60 MG PO TB24: 30.0000 mg | ORAL_TABLET | Freq: Every day | ORAL | Status: DC

## 2016-10-17 MED ORDER — GUAIFENESIN ER 600 MG PO TB12: 600.0000 mg | ORAL_TABLET | Freq: Two times a day (BID) | ORAL | 0 refills | Status: DC

## 2016-10-17 MED ORDER — DILTIAZEM HCL ER COATED BEADS 120 MG PO CP24
120.0000 mg | ORAL_CAPSULE | Freq: Every day | ORAL | Status: DC
  Administered 2016-10-17: 120 mg via ORAL
  Filled 2016-10-17: qty 1

## 2016-10-17 MED ORDER — METOPROLOL TARTRATE 25 MG PO TABS: 12.5000 mg | ORAL_TABLET | Freq: Every day | ORAL | 2 refills | Status: DC

## 2016-10-17 NOTE — Progress Notes (Signed)
[name] to be D/C'd Home per MD order. Discussed with the patient and all questions fully answered.    VVS, Skin clean, dry and intact without evidence of skin break down, no evidence of skin tears noted.  IV catheter discontinued intact. Site without signs and symptoms of complications. Dressing and pressure applied.  An After Visit Summary was printed and given to the patient.  Patient escorted via [place], and D/C home via private auto.  Cyndra Numbers  10/17/2016 7:00 PM

## 2016-10-17 NOTE — Discharge Summary (Signed)
Physician Discharge Summary  Patient ID: [name] MRN: [id number] DOB/AGE: [id number] y.o.  Admit date: 10/14/2016 Discharge date: 10/17/2016  Admission Diagnoses: Unstable angina Status post SVT Hypertension COPD Obstructive sleep apnea Tobacco abuse Hyperlipidemia  Discharge Diagnoses:  Principle problem: *Unstable angina* Active Problems:   Acute exacerbation of chronic bronchitis   SVT   Hypertension   COPD   Obstructive sleep apnea   Tobacco use disorder   Hyperlipidemia    Discharged Condition: fair  Hospital Course: 60 year old female patient with CAD, COPD, tobacco abuse. Obstructive sleep apnea, hyperlipidemia had chest pain radiating to [place] and shortness of breath had SVT treated with IV lopressor with conversion to sinus rhythm. Patient did not tolerate oral B-blocker dose in past. 120 MG. ER Diltiazem was added in morning and metoprolol was decreased to 12.5 mg at bed time. Her nuclear stress test was negative for reversible ischemia. She was discharged home in stable condition with follow up by me in 1 week and primary care in 1-2 weeks.  Consults: cardiology  Significant Diagnostic Studies:  Near normal lipid level. BMET showed minimal hypokalemia and hypercalcemia, improved with supplementation. CBC showed mild leukocytosis improved with antibiotic use.  EKG-SVT to SR with right axis deviation.  Chest X-ray: Unremarkable.  Nuclear medicine: Lexiscan cardiolyte stress test - negative for reversible ischemia.  Treatments: cardiac meds: metoprolol, diltiazem and NTG patch. Patient reminded to quit smoking and use nicotine patch and anxiolytic agents as needed.  Discharge Exam: Blood pressure (!) 111/50, pulse (!) 58, temperature 98.3 F (36.8 C), temperature source Oral, resp. rate 18, height 4\' 9"  (1.448 m), weight 63.5 kg (140 lb), last menstrual period 09/06/2005, SpO2 98 %. General appearance: alert, cooperative, appears older than stated age  and mild distress Head: Normocephalic, atraumatic Eyes: Blue eyes, pink conjunctivae/corneas clear. PERRL, EOM's intact.  Neck: No adenopathy, no carotid bruit, no JVD, supple, symmetrical, trachea midline and thyroid not enlarged. Resp: Clear to auscultation bilaterally. Cardio: Regular rate and rhythm, S1, S2 normal, no murmur, click, rub or gallop GI: Soft, non-tender; bowel sounds normal; no masses, no organomegaly. Extremities: Extremities normal, atraumatic, no cyanosis or edema. Skin: Warm and dry. No rashes or lesions Neurologic: Alert and oriented X 3, normal strength and tone. Normal coordination and slow gait  Disposition: 01-Home or Self Care  Discharge Instructions    AMB Referral to [place] [facility]    Complete by:  As directed    Please assign to [place]. History of COPD, HTN, HLD. Currently at [facility]. Written consent obtained. Please call with questions. Thanks. Marthenia Rolling, [place], RN,BSN-[facility]N [facility] [phone]   Reason for consult:  Please assign to [facility] RNCM   Diagnoses of:  COPD/ Pneumonia   Expected date of contact:  1-3 days (reserved for hospital discharges)       Medication List    STOP taking these medications   amLODipine 5 MG tablet Commonly known as:  NORVASC   methocarbamol 500 MG tablet Commonly known as:  ROBAXIN   traZODone 50 MG tablet Commonly known as:  DESYREL     TAKE these medications   albuterol 108 (90 Base) MCG/ACT inhaler Commonly known as:  PROVENTIL HFA;VENTOLIN HFA Inhale 2 puffs into the lungs every 6 (six) hours as needed for wheezing or shortness of breath.   aspirin 81 MG EC tablet Take 1 tablet (81 mg total) by mouth daily. Start taking on:  10/18/2016 What changed:  medication strength  how much to take  when to take this   azithromycin 250 MG tablet Commonly known as:  ZITHROMAX One PO daily x 4 more days. Start taking on:  10/18/2016   buPROPion 150 MG 24 hr  tablet Commonly known as:  WELLBUTRIN XL Take 150 mg by mouth daily.   cholecalciferol 1000 units tablet Commonly known as:  VITAMIN D Take 1,000 Units by mouth every morning.   clonazePAM 1 MG tablet Commonly known as:  KLONOPIN Take 1 mg by mouth 2 (two) times daily.   diltiazem 120 MG 24 hr capsule Commonly known as:  CARDIZEM CD Take 1 capsule (120 mg total) by mouth daily. Start taking on:  10/18/2016   DULoxetine 60 MG capsule Commonly known as:  CYMBALTA Take 60 mg by mouth at bedtime.   fenofibrate 48 MG tablet Commonly known as:  TRICOR Take 48 mg by mouth every morning.   ferrous sulfate 325 (65 FE) MG tablet Take 325 mg by mouth daily with breakfast.   guaiFENesin 600 MG 12 hr tablet Commonly known as:  MUCINEX Take 1 tablet (600 mg total) by mouth 2 (two) times daily.   HYDROcodone-acetaminophen 5-325 MG tablet Commonly known as:  NORCO/VICODIN Take 1 tablet by mouth 2 (two) times daily as needed for pain.   isosorbide mononitrate 60 MG 24 hr tablet Commonly known as:  IMDUR Take 0.5 tablets (30 mg total) by mouth daily. What changed:  how much to take   loperamide 2 MG capsule Commonly known as:  IMODIUM Take 1 capsule (2 mg total) by mouth 4 (four) times daily as needed for diarrhea or loose stools.   meclizine 12.5 MG tablet Commonly known as:  ANTIVERT TAKE 1 TABLET 3 TIMES A DAY AS NEEDED FOR DIZZYNESS   metoprolol tartrate 25 MG tablet Commonly known as:  LOPRESSOR Take 0.5 tablets (12.5 mg total) by mouth at bedtime.   nicotine 21 mg/24hr patch Commonly known as:  NICODERM CQ - dosed in mg/24 hours Place 1 patch (21 mg total) onto the skin daily.   nitroGLYCERIN 0.4 MG SL tablet Commonly known as:  NITROSTAT Place 1 tablet (0.4 mg total) under the tongue every 5 (five) minutes x 3 doses as needed for chest pain.   ondansetron 4 MG tablet Commonly known as:  ZOFRAN Take 1 tablet (4 mg total) by mouth every 6 (six) hours as needed for  nausea or vomiting.   pantoprazole 40 MG tablet Commonly known as:  PROTONIX Take 1 tablet (40 mg total) by mouth daily as needed. What changed:  reasons to take this   SUPER B COMPLEX/C PO Take 1 tablet by mouth every morning.   vitamin C 1000 MG tablet Take 1,000 mg by mouth daily.      Follow-up Information    WEBB, CAROL D, MD. Schedule an appointment as soon as possible for a visit in 1 week(s).   Specialty:  Family Medicine Contact information: [place] [address] [phone]        Birdie Riddle, MD. Schedule an appointment as soon as possible for a visit in 1 week(s).   Specialty:  Cardiology Contact information: [place] [address] [phone]           Signed: Birdie Riddle 10/17/2016, 6:22 PM

## 2016-10-19 ENCOUNTER — Other Ambulatory Visit: Payer: Self-pay

## 2016-10-19 NOTE — Patient Outreach (Signed)
Transition of care: Patient returned call.  Reviewed reason for call. Explained [facility] transition of care program. Patient reports that she is doing well. Reports that she continues to smoke. States that she is going to start vaping and using the nicotine patches.  Reports that she is currently smoking 1 pack per day.  Patient reports her breathing is good. States that she has all her medication and is taking as prescribed.  States that she has follow up with primary MD on 10/24/2016.   Outpatient Encounter Prescriptions as of 10/19/2016  Medication Sig  . albuterol (PROVENTIL HFA;VENTOLIN HFA) 108 (90 BASE) MCG/ACT inhaler Inhale 2 puffs into the lungs every 6 (six) hours as needed for wheezing or shortness of breath.  . Ascorbic Acid (VITAMIN C) 1000 MG tablet Take 1,000 mg by mouth daily.  Marland Kitchen aspirin EC 81 MG EC tablet Take 1 tablet (81 mg total) by mouth daily.  Marland Kitchen azithromycin (ZITHROMAX) 250 MG tablet One PO daily x 4 more days.  Marland Kitchen buPROPion (WELLBUTRIN XL) 150 MG 24 hr tablet Take 150 mg by mouth daily.   . cholecalciferol (VITAMIN D) 1000 UNITS tablet Take 1,000 Units by mouth every morning.   . clonazePAM (KLONOPIN) 1 MG tablet Take 1 mg by mouth 2 (two) times daily.  Marland Kitchen diltiazem (CARDIZEM CD) 120 MG 24 hr capsule Take 1 capsule (120 mg total) by mouth daily.  . DULoxetine (CYMBALTA) 60 MG capsule Take 60 mg by mouth at bedtime.   . fenofibrate (TRICOR) 48 MG tablet Take 48 mg by mouth every morning.   . ferrous sulfate 325 (65 FE) MG tablet Take 325 mg by mouth daily with breakfast.  . guaiFENesin (MUCINEX) 600 MG 12 hr tablet Take 1 tablet (600 mg total) by mouth 2 (two) times daily.  Marland Kitchen HYDROcodone-acetaminophen (NORCO) 5-325 MG per tablet Take 1 tablet by mouth 2 (two) times daily as needed for pain.   . isosorbide mononitrate (IMDUR) 60 MG 24 hr tablet Take 0.5 tablets (30 mg total) by mouth daily.  Marland Kitchen loperamide (IMODIUM) 2 MG capsule Take 1 capsule (2 mg total) by mouth 4 (four) times daily  as needed for diarrhea or loose stools.  . meclizine (ANTIVERT) 12.5 MG tablet TAKE 1 TABLET 3 TIMES A DAY AS NEEDED FOR DIZZYNESS  . metoprolol tartrate (LOPRESSOR) 25 MG tablet Take 0.5 tablets (12.5 mg total) by mouth at bedtime.  . nicotine (NICODERM CQ - DOSED IN MG/24 HOURS) 21 mg/24hr patch Place 1 patch (21 mg total) onto the skin daily.  . nitroGLYCERIN (NITROSTAT) 0.4 MG SL tablet Place 1 tablet (0.4 mg total) under the tongue every 5 (five) minutes x 3 doses as needed for chest pain.  Marland Kitchen ondansetron (ZOFRAN) 4 MG tablet Take 1 tablet (4 mg total) by mouth every 6 (six) hours as needed for nausea or vomiting.  . pantoprazole (PROTONIX) 40 MG tablet Take 1 tablet (40 mg total) by mouth daily as needed. (Patient taking differently: Take 40 mg by mouth daily as needed (for acid reflux). )  . SUPER B COMPLEX/C PO Take 1 tablet by mouth every morning.   No facility-administered encounter medications on file as of 10/19/2016.     PLAN: offered home visit. Patient accepted home visit on 10/31/2016. Confirmed address. Provided my contact information. Will outreach patient in 1 week for follow up. Encouraged patient to call sooner if needed.  Patient request information on heart healthy diet upon my home visit. Will plan to have printed information for  patient.  [facility] CM Care Plan Problem One   Flowsheet Row Most Recent Value  Care Plan Problem One  Recent hospital admission for chest pain and shortness of breath.   Role Documenting the Problem One  Care Management [place] for Problem One  Active  THN Long Term Goal (31-90 days)  Patient will report no readmissions in the next 31 days.  THN Long Term Goal Start Date  10/19/16  Interventions for Problem One Long Term Goal  Reviewed transition of care program. Reviewed discharge instructions. Scheduled home visit.   THN CM Short Term Goal #1 (0-30 days)  Patient will follow up with primary Md in 1 week  THN CM Short Term Goal #1  Start Date  10/19/16  Interventions for Short Term Goal #1  Reviewed importance of timely follow up with primary Md.   THN CM Short Term Goal #2 (0-30 days)  Patient will report understanding COPD zones within the next 30 days.  THN CM Short Term Goal #2 Start Date  10/19/16  Interventions for Short Term Goal #2  Reviewed with patient the importance of early recognition of symptoms and promptly calling MD.       Will send this note and barrier letter to MD.  Tomasa Rand, RN, BSN, CEN [place] Coordinator [phone]

## 2016-10-24 DIAGNOSIS — R002 Palpitations: Secondary | ICD-10-CM | POA: Diagnosis not present

## 2016-10-24 DIAGNOSIS — Z09 Encounter for follow-up examination after completed treatment for conditions other than malignant neoplasm: Secondary | ICD-10-CM | POA: Diagnosis not present

## 2016-10-25 DIAGNOSIS — F17210 Nicotine dependence, cigarettes, uncomplicated: Secondary | ICD-10-CM | POA: Diagnosis not present

## 2016-10-25 DIAGNOSIS — I2510 Atherosclerotic heart disease of native coronary artery without angina pectoris: Secondary | ICD-10-CM | POA: Diagnosis not present

## 2016-10-25 DIAGNOSIS — I10 Essential (primary) hypertension: Secondary | ICD-10-CM | POA: Diagnosis not present

## 2016-10-26 ENCOUNTER — Other Ambulatory Visit: Payer: Self-pay

## 2016-10-26 DIAGNOSIS — M5136 Other intervertebral disc degeneration, lumbar region: Secondary | ICD-10-CM | POA: Diagnosis not present

## 2016-10-26 DIAGNOSIS — Z79891 Long term (current) use of opiate analgesic: Secondary | ICD-10-CM | POA: Diagnosis not present

## 2016-10-26 DIAGNOSIS — M5441 Lumbago with sciatica, right side: Secondary | ICD-10-CM | POA: Diagnosis not present

## 2016-10-26 NOTE — Patient Outreach (Signed)
Transition of Care:  Placed call to patient for transition of care: Patient reports that she is doing about the same. States that she saw 2 MD's this week.  Reports that she continues to smoke but is still considering vaping.  Plan: Confirmed home visit for next week.  Will see patient on 10/31/2016 Tomasa Rand, RN, BSN, CEN [place] Coordinator [phone]

## 2016-10-31 ENCOUNTER — Other Ambulatory Visit: Payer: Self-pay

## 2016-10-31 NOTE — Patient Outreach (Signed)
[facility]) Care Management   10/31/2016  [name] [birth year] [id number]  [name] is an 60 y.o. female Scheduled home visit at 10:00 am.  Subjective:  Reports that her breathing in better since discharged home from hospital. Reports that she continues to smoke but is interested in seeking out a vape to help her stop smoking.  Reports that he home is very dirty and cluttered. States that her daughter and granddaughter moved in with her about 1-2 years ago and now her house is a Hydrolog[employer].   Objective:  Home is very cluttered. Struggled to find a place to sit. 2 dogs and a cat live indoors.  Patient actively smoking when I arrived and put of the cigarette. Awake and alert. Talking in complete sentences.  Vitals:   10/31/16 1025  BP: 130/82  Pulse: 72  Resp: 18  SpO2: 96%  Weight: 142 lb (64.4 kg)  Height: 1.499 m (4' 11")   Review of Systems  Constitutional: Negative.   HENT: Negative.   Eyes:       Recent eye exam.  Wears glasses  Respiratory: Positive for cough.   Cardiovascular: Negative.   Gastrointestinal: Positive for diarrhea.       Reports on and off diarrhea  Genitourinary: Positive for dysuria, frequency and urgency.  Musculoskeletal: Positive for back pain.  Skin: Negative.   Neurological:       Reports occasional dizziness. None today  Endo/Heme/Allergies: Negative.   Psychiatric/Behavioral:       Reports mild anxiety in the last 24 hours.    Physical Exam  Constitutional: She is oriented to person, place, and time. She appears well-developed and well-nourished.  Cardiovascular: Normal rate and normal heart sounds.   Respiratory: Effort normal and breath sounds normal.  GI: Soft. Bowel sounds are normal.  Musculoskeletal: Normal range of motion.  Neurological: She is alert and oriented to person, place, and time.  Skin: Skin is warm and dry.  Psychiatric: She has a normal mood and affect. Her behavior is normal. Judgment and thought  content normal.    Encounter Medications:   Outpatient Encounter Prescriptions as of 10/31/2016  Medication Sig  . albuterol (PROVENTIL HFA;VENTOLIN HFA) 108 (90 BASE) MCG/ACT inhaler Inhale 2 puffs into the lungs every 6 (six) hours as needed for wheezing or shortness of breath.  . Ascorbic Acid (VITAMIN C) 1000 MG tablet Take 1,000 mg by mouth daily.  Marland Kitchen aspirin EC 81 MG EC tablet Take 1 tablet (81 mg total) by mouth daily.  Marland Kitchen buPROPion (WELLBUTRIN XL) 150 MG 24 hr tablet Take 150 mg by mouth daily.   . cholecalciferol (VITAMIN D) 1000 UNITS tablet Take 1,000 Units by mouth every morning.   . clonazePAM (KLONOPIN) 1 MG tablet Take 1 mg by mouth 2 (two) times daily.  Marland Kitchen diltiazem (CARDIZEM CD) 120 MG 24 hr capsule Take 1 capsule (120 mg total) by mouth daily.  . DULoxetine (CYMBALTA) 60 MG capsule Take 60 mg by mouth at bedtime.   . fenofibrate (TRICOR) 48 MG tablet Take 48 mg by mouth every morning.   . ferrous sulfate 325 (65 FE) MG tablet Take 325 mg by mouth daily with breakfast.  . guaiFENesin (MUCINEX) 600 MG 12 hr tablet Take 1 tablet (600 mg total) by mouth 2 (two) times daily.  Marland Kitchen HYDROcodone-acetaminophen (NORCO) 5-325 MG per tablet Take 1 tablet by mouth 2 (two) times daily as needed for pain.   . isosorbide mononitrate (IMDUR) 60 MG 24  hr tablet Take 0.5 tablets (30 mg total) by mouth daily.  Marland Kitchen loperamide (IMODIUM) 2 MG capsule Take 1 capsule (2 mg total) by mouth 4 (four) times daily as needed for diarrhea or loose stools.  . Magnesium 500 MG CAPS Take 1 tablet by mouth daily.  . meclizine (ANTIVERT) 12.5 MG tablet TAKE 1 TABLET 3 TIMES A DAY AS NEEDED FOR DIZZYNESS  . metoprolol tartrate (LOPRESSOR) 25 MG tablet Take 0.5 tablets (12.5 mg total) by mouth at bedtime.  . nitroGLYCERIN (NITROSTAT) 0.4 MG SL tablet Place 1 tablet (0.4 mg total) under the tongue every 5 (five) minutes x 3 doses as needed for chest pain.  Marland Kitchen ondansetron (ZOFRAN) 4 MG tablet Take 1 tablet (4 mg total) by  mouth every 6 (six) hours as needed for nausea or vomiting.  . pantoprazole (PROTONIX) 40 MG tablet Take 1 tablet (40 mg total) by mouth daily as needed. (Patient taking differently: Take 40 mg by mouth daily as needed (for acid reflux). )  . SUPER B COMPLEX/C PO Take 1 tablet by mouth every morning.  Marland Kitchen azithromycin (ZITHROMAX) 250 MG tablet One PO daily x 4 more days. (Patient not taking: Reported on 10/31/2016)  . nicotine (NICODERM CQ - DOSED IN MG/24 HOURS) 21 mg/24hr patch Place 1 patch (21 mg total) onto the skin daily. (Patient not taking: Reported on 10/31/2016)   No facility-administered encounter medications on file as of 10/31/2016.     Functional Status:   In your present state of health, do you have any difficulty performing the following activities: 10/31/2016 10/14/2016  Hearing? N N  Vision? N N  Difficulty concentrating or making decisions? N N  Walking or climbing stairs? Y N  Dressing or bathing? N Y  Doing errands, shopping? N N  Preparing Food and eating ? N -  Using the Toilet? N -  In the past six months, have you accidently leaked urine? Y -  Do you have problems with loss of bowel control? Y -  Managing your Medications? N -  Managing your Finances? N -  Housekeeping or managing your Housekeeping? Y -  Some recent data might be hidden    Fall/Depression Screening:    PHQ 2/9 Scores 10/31/2016  PHQ - 2 Score 1   Fall Risk  10/31/2016 02/18/2016 11/03/2015  Falls in the past year? Yes Yes Yes  Number falls in past yr: 2 or more 2 or more 2 or more  Injury with Fall? No No No  Risk for fall due to : - - Impaired balance/gait  Follow up Falls prevention discussed Falls evaluation completed Falls evaluation completed;Falls prevention discussed   Assessment:   (1) reviewed [facility] transition of care program. Reviewed consent signed while in hospital. Denies any changes needed to consent form.   Provided my contact card. Reports that she has packet including magnet  provided in the hospital. (2) no advanced directives. (3) continues to smoke. (4) home cluttered.  (5) reports breathing has improved.  Plan:  (1) consent in chart.  Patient will be outreached for 30 post discharge via phone weekly. Reviewed with patient and she agrees. (2) provided patient with advanced directive packet. (3) provided EMMI education and encouraged patient to stop smoking.  (4) Offered for referral to [facility] social worker for resources for home assistance or improvement. Patient declined. (5) encouraged patient to continue to take medications as prescribed and notify MD for changes in condition.    Care planning and goal setting. Primary  goal is to avoid readmission.  Next outreach planned within 1 week. [facility] CM Care Plan Problem One   Flowsheet Row Most Recent Value  Care Plan Problem One  Recent hospital admission for chest pain and shortness of breath.   Role Documenting the Problem One  Care Management [place] for Problem One  Active  THN Long Term Goal (31-90 days)  Patient will report no readmissions in the next 31 days.  THN Long Term Goal Start Date  10/19/16  Interventions for Problem One Long Term Goal  Home visit completed. Encouraged patient to continue to take medications as prescribed and notify MD for changes in condition.   [facility]N CM Short Term Goal #1 (0-30 days)  Patient will follow up with primary Md in 1 week  THN CM Short Term Goal #1 Start Date  10/19/16  [facility] CM Short Term Goal #1 Met Date  10/26/16  Interventions for Short Term Goal #1  Reviewed importance of timely follow up with primary Md.   THN CM Short Term Goal #2 (0-30 days)  Patient will report understanding COPD zones within the next 30 days.  THN CM Short Term Goal #2 Start Date  10/19/16  Interventions for Short Term Goal #2  Reviewed importance of smoking cessation. [place] education provided on COPD and smoking cessation.     This note sent to MD.  Tomasa Rand, RN, BSN,  CEN [place] Coordinator [phone]

## 2016-11-06 ENCOUNTER — Other Ambulatory Visit: Payer: Self-pay

## 2016-11-06 NOTE — Patient Outreach (Signed)
Transition of care: Placed call to patient who reports that she is doing well.States that she continues to smoke. Reports she knows the importance of smoking cessation. Denies any new problems or concerns. States she has spoken with pharmacy about cardizem. Reports no new problems or concerns.  PLAN: Will continue to contact patient weekly for transition of care calls.  Tomasa Rand, RN, BSN, CEN [facility] [employer] [phone]

## 2016-11-14 ENCOUNTER — Other Ambulatory Visit: Payer: Self-pay

## 2016-11-14 NOTE — Patient Outreach (Signed)
Transition of care call: Attempted to reach patient x 2 today. Unsuccessful. An automated text message reply that patient was driving.  PLAN: Will attempt another outreach in 1 week.  Tomasa Rand, RN, BSN, CEN [facility] [employer] [phone]

## 2016-11-15 ENCOUNTER — Other Ambulatory Visit: Payer: Self-pay

## 2016-11-15 NOTE — Patient Outreach (Signed)
Transition of care call: Patient returned call and left a voicemail. I called patient back and she reports that she is having frequent urination and staying up at night and sleeping during the day. Patient reports that she can turn up her stimulator for her bladder and she will try this. Denies any difficulty with breathing at this time.  PLAN: Encouraged patient to stay up during the day and be active to help her sleep at night. Will continue weekly outreach for transition of care.  Tomasa Rand, RN, BSN, CEN [facility] [employer] [phone]

## 2016-11-20 ENCOUNTER — Other Ambulatory Visit: Payer: Self-pay

## 2016-11-20 NOTE — Patient Outreach (Signed)
Final transition of care call and case closure: Placed call to patient who reports that she is doing well. States no new problems or concerns.  States that she continues to smoke and is trying to quit.   Reviewed successful completion of transition of care program.  Reviewed needs and denies any at this time.    PLAN: Will close case as goals are met. Will notify MD. Will send patient case closure letter.  Tomasa Rand, RN, BSN, CEN [facility] [employer] [phone]

## 2016-11-27 DIAGNOSIS — M545 Low back pain: Secondary | ICD-10-CM | POA: Diagnosis not present

## 2016-11-27 DIAGNOSIS — I2510 Atherosclerotic heart disease of native coronary artery without angina pectoris: Secondary | ICD-10-CM | POA: Diagnosis not present

## 2016-11-27 DIAGNOSIS — K580 Irritable bowel syndrome with diarrhea: Secondary | ICD-10-CM | POA: Diagnosis not present

## 2016-11-27 DIAGNOSIS — F17210 Nicotine dependence, cigarettes, uncomplicated: Secondary | ICD-10-CM | POA: Diagnosis not present

## 2016-11-27 DIAGNOSIS — I10 Essential (primary) hypertension: Secondary | ICD-10-CM | POA: Diagnosis not present

## 2016-12-02 DIAGNOSIS — S40022A Contusion of left upper arm, initial encounter: Secondary | ICD-10-CM | POA: Diagnosis not present

## 2016-12-02 DIAGNOSIS — W19XXXA Unspecified fall, initial encounter: Secondary | ICD-10-CM | POA: Diagnosis not present

## 2016-12-02 DIAGNOSIS — S7002XA Contusion of left hip, initial encounter: Secondary | ICD-10-CM | POA: Diagnosis not present

## 2017-01-01 ENCOUNTER — Inpatient Hospital Stay (HOSPITAL_COMMUNITY)
Admission: EM | Admit: 2017-01-01 | Discharge: 2017-01-04 | DRG: 193 | Disposition: A | Discharge status: Home / Self Care | Payer: Medicare Other | Attending: Internal Medicine | Admitting: Internal Medicine

## 2017-01-01 ENCOUNTER — Inpatient Hospital Stay (HOSPITAL_COMMUNITY): Payer: Medicare Other

## 2017-01-01 ENCOUNTER — Emergency Department (HOSPITAL_COMMUNITY): Payer: Medicare Other

## 2017-01-01 ENCOUNTER — Encounter (HOSPITAL_COMMUNITY): Payer: Self-pay

## 2017-01-01 DIAGNOSIS — I2510 Atherosclerotic heart disease of native coronary artery without angina pectoris: Secondary | ICD-10-CM | POA: Diagnosis not present

## 2017-01-01 DIAGNOSIS — Z79899 Other long term (current) drug therapy: Secondary | ICD-10-CM | POA: Diagnosis not present

## 2017-01-01 DIAGNOSIS — G4733 Obstructive sleep apnea (adult) (pediatric): Secondary | ICD-10-CM

## 2017-01-01 DIAGNOSIS — F329 Major depressive disorder, single episode, unspecified: Secondary | ICD-10-CM | POA: Diagnosis present

## 2017-01-01 DIAGNOSIS — J441 Chronic obstructive pulmonary disease with (acute) exacerbation: Secondary | ICD-10-CM | POA: Diagnosis present

## 2017-01-01 DIAGNOSIS — E860 Dehydration: Secondary | ICD-10-CM | POA: Diagnosis not present

## 2017-01-01 DIAGNOSIS — R0600 Dyspnea, unspecified: Secondary | ICD-10-CM | POA: Diagnosis not present

## 2017-01-01 DIAGNOSIS — Z981 Arthrodesis status: Secondary | ICD-10-CM | POA: Diagnosis not present

## 2017-01-01 DIAGNOSIS — M797 Fibromyalgia: Secondary | ICD-10-CM | POA: Diagnosis present

## 2017-01-01 DIAGNOSIS — R531 Weakness: Secondary | ICD-10-CM | POA: Diagnosis not present

## 2017-01-01 DIAGNOSIS — K219 Gastro-esophageal reflux disease without esophagitis: Secondary | ICD-10-CM | POA: Diagnosis present

## 2017-01-01 DIAGNOSIS — R05 Cough: Secondary | ICD-10-CM | POA: Diagnosis not present

## 2017-01-01 DIAGNOSIS — F419 Anxiety disorder, unspecified: Secondary | ICD-10-CM | POA: Diagnosis present

## 2017-01-01 DIAGNOSIS — J9601 Acute respiratory failure with hypoxia: Secondary | ICD-10-CM | POA: Diagnosis present

## 2017-01-01 DIAGNOSIS — A419 Sepsis, unspecified organism: Secondary | ICD-10-CM

## 2017-01-01 DIAGNOSIS — J101 Influenza due to other identified influenza virus with other respiratory manifestations: Secondary | ICD-10-CM | POA: Diagnosis not present

## 2017-01-01 DIAGNOSIS — N179 Acute kidney failure, unspecified: Secondary | ICD-10-CM | POA: Diagnosis not present

## 2017-01-01 DIAGNOSIS — Z7982 Long term (current) use of aspirin: Secondary | ICD-10-CM

## 2017-01-01 DIAGNOSIS — F17200 Nicotine dependence, unspecified, uncomplicated: Secondary | ICD-10-CM | POA: Diagnosis present

## 2017-01-01 DIAGNOSIS — J111 Influenza due to unidentified influenza virus with other respiratory manifestations: Secondary | ICD-10-CM | POA: Diagnosis not present

## 2017-01-01 DIAGNOSIS — I10 Essential (primary) hypertension: Secondary | ICD-10-CM

## 2017-01-01 DIAGNOSIS — Z9989 Dependence on other enabling machines and devices: Secondary | ICD-10-CM

## 2017-01-01 DIAGNOSIS — R404 Transient alteration of awareness: Secondary | ICD-10-CM | POA: Diagnosis not present

## 2017-01-01 LAB — URINALYSIS, ROUTINE W REFLEX MICROSCOPIC
BILIRUBIN URINE: NEGATIVE
Glucose, UA: NEGATIVE mg/dL
HGB URINE DIPSTICK: NEGATIVE
KETONES UR: NEGATIVE mg/dL
Leukocytes, UA: NEGATIVE
Nitrite: NEGATIVE
PROTEIN: NEGATIVE mg/dL
Specific Gravity, Urine: 1.003 — ABNORMAL LOW (ref 1.005–1.030)
pH: 6 (ref 5.0–8.0)

## 2017-01-01 LAB — COMPREHENSIVE METABOLIC PANEL
ALK PHOS: 68 U/L (ref 38–126)
ALT: 43 U/L (ref 14–54)
AST: 55 U/L — ABNORMAL HIGH (ref 15–41)
Albumin: 3.8 g/dL (ref 3.5–5.0)
Anion gap: 13 (ref 5–15)
BUN: 21 mg/dL — AB (ref 6–20)
CALCIUM: 9.9 mg/dL (ref 8.9–10.3)
CO2: 25 mmol/L (ref 22–32)
CREATININE: 1.65 mg/dL — AB (ref 0.44–1.00)
Chloride: 95 mmol/L — ABNORMAL LOW (ref 101–111)
GFR calc non Af Amer: 32 mL/min — ABNORMAL LOW (ref >60)
GFR, EST AFRICAN AMERICAN: 37 mL/min — AB (ref >60)
GLUCOSE: 110 mg/dL — AB (ref 65–99)
Potassium: 3.6 mmol/L (ref 3.5–5.1)
SODIUM: 133 mmol/L — AB (ref 135–145)
Total Bilirubin: 0.6 mg/dL (ref 0.3–1.2)
Total Protein: 6.6 g/dL (ref 6.5–8.1)

## 2017-01-01 LAB — PROCALCITONIN

## 2017-01-01 LAB — I-STAT CG4 LACTIC ACID, ED: Lactic Acid, Venous: 2.55 mmol/L (ref 0.5–1.9)

## 2017-01-01 LAB — LIPASE, BLOOD: Lipase: 31 U/L (ref 11–51)

## 2017-01-01 LAB — CBC
HCT: 43.9 % (ref 36.0–46.0)
Hemoglobin: 15 g/dL (ref 12.0–15.0)
MCH: 30.4 pg (ref 26.0–34.0)
MCHC: 34.2 g/dL (ref 30.0–36.0)
MCV: 88.9 fL (ref 78.0–100.0)
PLATELETS: 236 10*3/uL (ref 150–400)
RBC: 4.94 MIL/uL (ref 3.87–5.11)
RDW: 13.2 % (ref 11.5–15.5)
WBC: 7 10*3/uL (ref 4.0–10.5)

## 2017-01-01 LAB — INFLUENZA PANEL BY PCR (TYPE A & B)
Influenza A By PCR: POSITIVE — AB
Influenza B By PCR: NEGATIVE

## 2017-01-01 LAB — LACTIC ACID, PLASMA: LACTIC ACID, VENOUS: 1.4 mmol/L (ref 0.5–1.9)

## 2017-01-01 MED ORDER — HYDROCODONE-ACETAMINOPHEN 5-325 MG PO TABS: 1.0000 | ORAL_TABLET | ORAL | Status: DC | PRN

## 2017-01-01 MED ORDER — SODIUM CHLORIDE 0.9% FLUSH
3.0000 mL | Freq: Two times a day (BID) | INTRAVENOUS | Status: DC
  Administered 2017-01-02 – 2017-01-04 (×3): 3 mL via INTRAVENOUS

## 2017-01-01 MED ORDER — BUPROPION HCL ER (XL) 150 MG PO TB24
150.0000 mg | ORAL_TABLET | Freq: Every day | ORAL | Status: DC
  Administered 2017-01-02 – 2017-01-04 (×3): 150 mg via ORAL
  Filled 2017-01-01 (×3): qty 1

## 2017-01-01 MED ORDER — HYDROCODONE-ACETAMINOPHEN 5-325 MG PO TABS
1.0000 | ORAL_TABLET | Freq: Two times a day (BID) | ORAL | Status: DC | PRN
  Administered 2017-01-02: 1 via ORAL
  Filled 2017-01-01: qty 1

## 2017-01-01 MED ORDER — ALBUTEROL SULFATE (2.5 MG/3ML) 0.083% IN NEBU: 2.5000 mg | INHALATION_SOLUTION | RESPIRATORY_TRACT | Status: DC | PRN

## 2017-01-01 MED ORDER — ASPIRIN EC 81 MG PO TBEC
81.0000 mg | DELAYED_RELEASE_TABLET | Freq: Every day | ORAL | Status: DC
  Administered 2017-01-02 – 2017-01-04 (×3): 81 mg via ORAL
  Filled 2017-01-01 (×3): qty 1

## 2017-01-01 MED ORDER — OSELTAMIVIR PHOSPHATE 75 MG PO CAPS
75.0000 mg | ORAL_CAPSULE | Freq: Once | ORAL | Status: AC
  Administered 2017-01-01: 75 mg via ORAL
  Filled 2017-01-01: qty 1

## 2017-01-01 MED ORDER — ENSURE ENLIVE PO LIQD
237.0000 mL | Freq: Two times a day (BID) | ORAL | Status: DC
  Administered 2017-01-02 – 2017-01-04 (×4): 237 mL via ORAL

## 2017-01-01 MED ORDER — OSELTAMIVIR PHOSPHATE 75 MG PO CAPS: 75.0000 mg | ORAL_CAPSULE | Freq: Two times a day (BID) | ORAL | Status: DC

## 2017-01-01 MED ORDER — ALBUTEROL SULFATE (2.5 MG/3ML) 0.083% IN NEBU
2.5000 mg | INHALATION_SOLUTION | Freq: Once | RESPIRATORY_TRACT | Status: AC
  Administered 2017-01-01: 2.5 mg via RESPIRATORY_TRACT
  Filled 2017-01-01: qty 3

## 2017-01-01 MED ORDER — METHYLPREDNISOLONE SODIUM SUCC 125 MG IJ SOLR
60.0000 mg | Freq: Two times a day (BID) | INTRAMUSCULAR | Status: AC
  Administered 2017-01-01 – 2017-01-02 (×2): 60 mg via INTRAVENOUS
  Filled 2017-01-01 (×2): qty 2

## 2017-01-01 MED ORDER — GUAIFENESIN ER 600 MG PO TB12
600.0000 mg | ORAL_TABLET | Freq: Two times a day (BID) | ORAL | Status: DC
  Administered 2017-01-01 – 2017-01-04 (×6): 600 mg via ORAL
  Filled 2017-01-01 (×6): qty 1

## 2017-01-01 MED ORDER — IPRATROPIUM-ALBUTEROL 0.5-2.5 (3) MG/3ML IN SOLN: 3.0000 mL | Freq: Four times a day (QID) | RESPIRATORY_TRACT | Status: DC

## 2017-01-01 MED ORDER — SODIUM CHLORIDE 0.9 % IV SOLN
Freq: Once | INTRAVENOUS | Status: AC
  Administered 2017-01-01: 100 mL/h via INTRAVENOUS

## 2017-01-01 MED ORDER — SODIUM CHLORIDE 0.9 % IV BOLUS (SEPSIS)
1000.0000 mL | Freq: Once | INTRAVENOUS | Status: AC
  Administered 2017-01-01: 1000 mL via INTRAVENOUS

## 2017-01-01 MED ORDER — SODIUM CHLORIDE 0.9 % IV SOLN
INTRAVENOUS | Status: AC
  Administered 2017-01-02: 07:00:00 via INTRAVENOUS

## 2017-01-01 MED ORDER — BENZONATATE 100 MG PO CAPS
100.0000 mg | ORAL_CAPSULE | Freq: Once | ORAL | Status: AC
  Administered 2017-01-01: 100 mg via ORAL
  Filled 2017-01-01: qty 1

## 2017-01-01 MED ORDER — DILTIAZEM HCL ER COATED BEADS 120 MG PO CP24
120.0000 mg | ORAL_CAPSULE | Freq: Every day | ORAL | Status: DC
  Administered 2017-01-02 – 2017-01-04 (×3): 120 mg via ORAL
  Filled 2017-01-01 (×3): qty 1

## 2017-01-01 MED ORDER — CLONAZEPAM 0.5 MG PO TABS
1.0000 mg | ORAL_TABLET | Freq: Two times a day (BID) | ORAL | Status: DC
Start: 2017-01-01 — End: 2017-01-04
  Administered 2017-01-01 – 2017-01-04 (×6): 1 mg via ORAL
  Filled 2017-01-01 (×6): qty 2

## 2017-01-01 MED ORDER — IPRATROPIUM-ALBUTEROL 0.5-2.5 (3) MG/3ML IN SOLN
3.0000 mL | Freq: Three times a day (TID) | RESPIRATORY_TRACT | Status: DC
  Administered 2017-01-02 – 2017-01-03 (×4): 3 mL via RESPIRATORY_TRACT
  Filled 2017-01-01 (×4): qty 3

## 2017-01-01 MED ORDER — DEXTROSE 5 % IV SOLN
500.0000 mg | INTRAVENOUS | Status: DC
  Administered 2017-01-02 – 2017-01-03 (×2): 500 mg via INTRAVENOUS
  Filled 2017-01-01 (×2): qty 500

## 2017-01-01 MED ORDER — PREDNISONE 20 MG PO TABS
40.0000 mg | ORAL_TABLET | Freq: Every day | ORAL | Status: DC
  Administered 2017-01-02 – 2017-01-03 (×2): 40 mg via ORAL
  Filled 2017-01-01 (×2): qty 2

## 2017-01-01 MED ORDER — ENOXAPARIN SODIUM 30 MG/0.3ML ~~LOC~~ SOLN
30.0000 mg | SUBCUTANEOUS | Status: DC
  Administered 2017-01-01: 30 mg via SUBCUTANEOUS
  Filled 2017-01-01: qty 0.3

## 2017-01-01 MED ORDER — ONDANSETRON HCL 4 MG PO TABS: 4.0000 mg | ORAL_TABLET | Freq: Four times a day (QID) | ORAL | Status: DC | PRN

## 2017-01-01 MED ORDER — ONDANSETRON HCL 4 MG/2ML IJ SOLN
4.0000 mg | Freq: Once | INTRAMUSCULAR | Status: AC
  Administered 2017-01-01: 4 mg via INTRAVENOUS
  Filled 2017-01-01: qty 2

## 2017-01-01 MED ORDER — ACETAMINOPHEN 650 MG RE SUPP: 650.0000 mg | Freq: Four times a day (QID) | RECTAL | Status: DC | PRN

## 2017-01-01 MED ORDER — OSELTAMIVIR PHOSPHATE 30 MG PO CAPS
30.0000 mg | ORAL_CAPSULE | ORAL | Status: DC
  Administered 2017-01-01: 30 mg via ORAL
  Filled 2017-01-01: qty 1

## 2017-01-01 MED ORDER — OXYMETAZOLINE HCL 0.05 % NA SOLN
1.0000 | Freq: Once | NASAL | Status: AC
  Administered 2017-01-01: 1 via NASAL
  Filled 2017-01-01: qty 15

## 2017-01-01 MED ORDER — FENOFIBRATE 54 MG PO TABS
54.0000 mg | ORAL_TABLET | Freq: Every day | ORAL | Status: DC
  Administered 2017-01-02 – 2017-01-04 (×3): 54 mg via ORAL
  Filled 2017-01-01 (×3): qty 1

## 2017-01-01 MED ORDER — DEXTROSE 5 % IV SOLN
500.0000 mg | Freq: Once | INTRAVENOUS | Status: AC
  Administered 2017-01-01: 500 mg via INTRAVENOUS
  Filled 2017-01-01: qty 500

## 2017-01-01 MED ORDER — ONDANSETRON HCL 4 MG/2ML IJ SOLN: 4.0000 mg | Freq: Four times a day (QID) | INTRAMUSCULAR | Status: DC | PRN

## 2017-01-01 MED ORDER — NICOTINE 14 MG/24HR TD PT24
14.0000 mg | MEDICATED_PATCH | TRANSDERMAL | Status: DC
  Administered 2017-01-01 – 2017-01-03 (×3): 14 mg via TRANSDERMAL
  Filled 2017-01-01 (×3): qty 1

## 2017-01-01 MED ORDER — ACETAMINOPHEN 500 MG PO TABS
1000.0000 mg | ORAL_TABLET | Freq: Once | ORAL | Status: AC
  Administered 2017-01-01: 1000 mg via ORAL
  Filled 2017-01-01: qty 2

## 2017-01-01 MED ORDER — DULOXETINE HCL 60 MG PO CPEP
60.0000 mg | ORAL_CAPSULE | Freq: Every day | ORAL | Status: DC
  Administered 2017-01-01 – 2017-01-03 (×3): 60 mg via ORAL
  Filled 2017-01-01 (×3): qty 1

## 2017-01-01 MED ORDER — ACETAMINOPHEN 325 MG PO TABS: 650.0000 mg | ORAL_TABLET | Freq: Four times a day (QID) | ORAL | Status: DC | PRN

## 2017-01-01 NOTE — H&P (Signed)
[name] [id number] DOB: 1956-11-03 DOA: 01/01/2017     PCP: [name], MD   Outpatient Specialists: Cardiology [facility] Patient coming from:  home Lives  With family    Chief Complaint: Flu-like symptoms  HPI: [name] is a 61 y.o. female with medical history significant of  COPD, CAD, essential Hypertension, OSA, GERD     Presented with 4 days history of flulike symptoms associated with abdominal pain some nausea no no vomiting with loose stools   patient called EMS on their arrival patient was noted to be hypotensive blood pressure 101/61  Patient have had increased work of breathing and coughing headache and shortness of breath no associated chest pain she did not try taking any medications at home nothing seems to make it better. Reports poor PO intake, She have had some wheezing,  Patient had exposure to individuals with flulike symptoms last week. She is endorsing body aches cough. Patient continues to smoke. Reports have not been compliant with CPAP. Reports lately had palpitations and HR have been going down to 44.   Regarding pertinent Chronic problems:  Has hx of COPD not on home oxygen.  Reports hx of heart rte going up. Reports hx of irregular heart beats but had a monitor placed showed no a.fib. NO hx of DM.    IN ER: RRR 22 satting 96% HR 64 BP 116/65 Lactic acid 2.155 sodium 133 BUN 21 creatinine 1.65 up from baseline of 0.91 WBC 7.0  Noted to be hypoxic down to mid 80s on room air she was given breathing treatment of some improvement  Initial blood pressure systolics down to 76 improved with fluid bolus  Patient tested positive for influenza CXR showing chronic bronchitis changes nonacute  She was started on azithromycin  Following Medications were ordered in ER: Medications  albuterol (PROVENTIL) (2.5 MG/3ML) 0.083% nebulizer solution 2.5 mg (not administered)  azithromycin (ZITHROMAX) 500 mg in dextrose 5 % 250 mL IVPB (not administered)    0.9 %  sodium chloride infusion (not administered)  albuterol (PROVENTIL) (2.5 MG/3ML) 0.083% nebulizer solution 2.5 mg (2.5 mg Nebulization Given 01/01/17 1633)  oxymetazoline (AFRIN) 0.05 % nasal spray 1 spray (1 spray Each Nare Given 01/01/17 1633)  benzonatate (TESSALON) capsule 100 mg (100 mg Oral Given 01/01/17 1630)  acetaminophen (TYLENOL) tablet 1,000 mg (1,000 mg Oral Given 01/01/17 1630)  sodium chloride 0.9 % bolus 1,000 mL (0 mLs Intravenous Stopped 01/01/17 1734)  ondansetron (ZOFRAN) injection 4 mg (4 mg Intravenous Given 01/01/17 1632)  sodium chloride 0.9 % bolus 1,000 mL (1,000 mLs Intravenous New Bag/Given 01/01/17 1730)  oseltamivir (TAMIFLU) capsule 75 mg (75 mg Oral Given 01/01/17 1813)     Hospitalist was called for admission for Acute influenza illness resulting in COPD exacerbation and acute renal failure   Review of Systems:    Pertinent positives include:  Fevers, chills,  abdominal pain, nausea, shortness of breath at rest.  No dyspnea on exertion,  non-productive cough,   Constitutional:  No weight loss, night sweats, fatigue, weight loss  HEENT:  No headaches, Difficulty swallowing,Tooth/dental problems,Sore throat,  No sneezing, itching, ear ache, nasal congestion, post nasal drip,  Cardio-vascular:  No chest pain, Orthopnea, PND, anasarca, dizziness, palpitations.no Bilateral lower extremity swelling  GI:  No heartburn, indigestion,vomiting, diarrhea, change in bowel habits, loss of appetite, melena, blood in stool, hematemesis Resp:   No excess mucus, no productive cough, NoNo coughing up of blood.No change in color of mucus.No wheezing. Skin:  no rash or lesions. No jaundice GU:  no dysuria, change in color of urine, no urgency or frequency. No straining to urinate.  No flank pain.  Musculoskeletal:  No joint pain or no joint swelling. No decreased range of motion. No back pain.  Psych:  No change in mood or affect. No depression or anxiety. No memory  loss.  Neuro: no localizing neurological complaints, no tingling, no weakness, no double vision, no gait abnormality, no slurred speech, no confusion  As per HPI otherwise 10 point review of systems negative.   Past Medical History: Past Medical History:  Diagnosis Date  . Agoraphobia without history of panic disorder   . Anxiety   . Asthma   . Coronary artery disease CARDIOLOGIST- DR  Doylene Canard  (VISIT 03-30-11 W/ CHART)   NON-OBS. CAD   (STRESS TEST NOV. 2011  . DDD (degenerative disc disease)   . Depression   . DJD (degenerative joint disease)    JOINT PAIN  . Fibromyalgia   . Frequency of urination   . GERD (gastroesophageal reflux disease) AND HIATIAL HERNIA   CONTROLLED W/ NEXIUM  . Hemorrhoids   . History of gastric ulcer 2004  . Hypertension   . IBS (irritable bowel syndrome)   . Incisional pain s/p interstim implant 1st stage--- 12-07-11    left upper buttock-- pt states dressing clean dry and intact (on 12-08-11)  . Insomnia   . Iron deficiency anemia   . Nocturia   . Non-productive cough   . Numbness in both hands AT TIMES  . OSA on CPAP   . Urge urinary incontinence    Past Surgical History:  Procedure Laterality Date  . CARDIAC CATHETERIZATION  2003  . CARDIAC CATHETERIZATION N/A 08/23/2016   Procedure: Left Heart Cath and Coronary Angiography;  Surgeon: Dixie Dials, MD;  Location: [facility];  Service: Cardiovascular;  Laterality: N/A;  . CERVICAL DISCECTOMY  2002   C4 - 7  . CERVICAL FUSION  2008   C4 - T1  . CHOLECYSTECTOMY  1982  . CYSTO/ HOD/ BLADDER BX  2006  . CYSTO/ HOD/ BLADDER BX/ FULGERATION  06-12-2011  . HYSTEROSCOPY W/D&C  2005  . INTERSTIM IMPLANT PLACEMENT  12/07/2011   Procedure: Barrie Lyme IMPLANT FIRST STAGE;  Surgeon: [name], MD;  Location[facility];  Service: Urology;  Laterality: Right;  . INTERSTIM IMPLANT PLACEMENT  12/14/2011   Procedure: Barrie Lyme IMPLANT SECOND STAGE;  Surgeon: [name], MD;  Location: [facility];  Service: Urology;  Laterality: Right;  rad tech ok by vickie at [facility]  . LAMINECTOMY  2007   L3 - 5  . RIGHT THUMB SURG.  2001  . TONSILLECTOMY AND ADENOIDECTOMY  1965  . WRIST SURGERY Left    [facility]     Social History:  Ambulatory  independently      reports that she has been smoking.  She has a 60.00 pack-year smoking history. She has never used smokeless tobacco. She reports that she does not drink alcohol or use drugs.  Allergies:   Allergies  Allergen Reactions  . Midazolam Hcl Other (See Comments)    HYPER  . Prednisone Other (See Comments)    HYPER  . Statins Other (See Comments)    Causes muscle weakness and joint pain  . Ace Inhibitors Other (See Comments)    DECREASES HEARTRATE  . Amoxicillin Diarrhea  . Nsaids Other (See Comments)    AVOIDS HX GASTRIC ULCER  .  Sulfa Antibiotics Rash and Other (See Comments)    JOINT PAIN       Family History:   Family History  Problem Relation Age of Onset  . Hypertension Father   . Heart disease Father   . Kidney failure Father   . Breast cancer Mother   . Hypertension Mother   . Coronary artery disease Mother   . Alzheimer's disease Mother   . Hypertension Brother   . Hypertension Sister   . Hypertension Daughter     Medications: Prior to Admission medications   Medication Sig Start Date End Date Taking? Authorizing Provider  albuterol (PROVENTIL HFA;VENTOLIN HFA) 108 (90 BASE) MCG/ACT inhaler Inhale 2 puffs into the lungs every 6 (six) hours as needed for wheezing or shortness of breath.    Historical Provider, MD  Ascorbic Acid (VITAMIN C) 1000 MG tablet Take 1,000 mg by mouth daily.    Historical Provider, MD  aspirin EC 81 MG EC tablet Take 1 tablet (81 mg total) by mouth daily. 10/18/16   Dixie Dials, MD  azithromycin (ZITHROMAX) 250 MG tablet One PO daily x 4 more days. Patient not taking: Reported on 10/31/2016 10/18/16   Dixie Dials, MD  buPROPion  (WELLBUTRIN XL) 150 MG 24 hr tablet Take 150 mg by mouth daily.     Historical Provider, MD  cholecalciferol (VITAMIN D) 1000 UNITS tablet Take 1,000 Units by mouth every morning.     Historical Provider, MD  clonazePAM (KLONOPIN) 1 MG tablet Take 1 mg by mouth 2 (two) times daily. 05/10/15   Historical Provider, MD  diltiazem (CARDIZEM CD) 120 MG 24 hr capsule Take 1 capsule (120 mg total) by mouth daily. 10/18/16   Dixie Dials, MD  DULoxetine (CYMBALTA) 60 MG capsule Take 60 mg by mouth at bedtime.     Historical Provider, MD  fenofibrate (TRICOR) 48 MG tablet Take 48 mg by mouth every morning.  04/30/15   Historical Provider, MD  ferrous sulfate 325 (65 FE) MG tablet Take 325 mg by mouth daily with breakfast.    Historical Provider, MD  guaiFENesin (MUCINEX) 600 MG 12 hr tablet Take 1 tablet (600 mg total) by mouth 2 (two) times daily. 10/17/16   Dixie Dials, MD  HYDROcodone-acetaminophen (NORCO) 5-325 MG per tablet Take 1 tablet by mouth 2 (two) times daily as needed for pain.     Historical Provider, MD  isosorbide mononitrate (IMDUR) 60 MG 24 hr tablet Take 0.5 tablets (30 mg total) by mouth daily. 10/17/16   Dixie Dials, MD  loperamide (IMODIUM) 2 MG capsule Take 1 capsule (2 mg total) by mouth 4 (four) times daily as needed for diarrhea or loose stools. 08/27/16   Forde Dandy, MD  Magnesium 500 MG CAPS Take 1 tablet by mouth daily.    Historical Provider, MD  meclizine (ANTIVERT) 12.5 MG tablet TAKE 1 TABLET 3 TIMES A DAY AS NEEDED FOR DIZZYNESS 01/12/16   Historical Provider, MD  metoprolol tartrate (LOPRESSOR) 25 MG tablet Take 0.5 tablets (12.5 mg total) by mouth at bedtime. 10/17/16   Dixie Dials, MD  nicotine (NICODERM CQ - DOSED IN MG/24 HOURS) 21 mg/24hr patch Place 1 patch (21 mg total) onto the skin daily. Patient not taking: Reported on 10/31/2016 08/24/16   Eugenie Filler, MD  nitroGLYCERIN (NITROSTAT) 0.4 MG SL tablet Place 1 tablet (0.4 mg total) under the tongue every 5 (five)  minutes x 3 doses as needed for chest pain. 08/24/16   Eugenie Filler, MD  ondansetron (ZOFRAN) 4 MG tablet Take 1 tablet (4 mg total) by mouth every 6 (six) hours as needed for nausea or vomiting. 08/27/16   Forde Dandy, MD  pantoprazole (PROTONIX) 40 MG tablet Take 1 tablet (40 mg total) by mouth daily as needed. Patient taking differently: Take 40 mg by mouth daily as needed (for acid reflux).  08/24/16   Eugenie Filler, MD  SUPER B COMPLEX/C PO Take 1 tablet by mouth every morning.    Historical Provider, MD    Physical Exam: Patient Vitals for the past 24 hrs:  BP Pulse Resp SpO2  01/01/17 1800 - - 22 -  01/01/17 1756 96/56 64 - (!) 86 %  01/01/17 1715 116/68 68 22 90 %  01/01/17 1703 98/68 69 24 90 %  01/01/17 1700 98/68 71 - 90 %  01/01/17 1645 90/55 64 - 93 %  01/01/17 1630 (!) 94/51 65 - 92 %  01/01/17 1617 (!) 75/54 64 - 90 %  01/01/17 1608 (!) 76/52 (!) 33 - 93 %  01/01/17 1606 (!) 81/47 - - -    1. General:  in No Acute distress 2. Psychological: Alert and  Oriented 3. Head/ENT:    Dry Mucous Membranes                          Head Non traumatic, neck supple                          Normal  Dentition 4. SKIN:   decreased Skin turgor,  Skin clean Dry and intact no rash 5. Heart: Regular rate and rhythm no  Murmur, Rub or gallop 6. Lungs:occasional wheezes or crackles   7. Abdomen: Soft,  non-tender, Non distended 8. Lower extremities: no clubbing, cyanosis, or edema 9. Neurologically Grossly intact, moving all 4 extremities equally   10. MSK: Normal range of motion   body mass index is unknown because there is no height or weight on file.  Labs on Admission:   Labs on Admission: I have personally reviewed following labs and imaging studies  CBC:  Recent Labs Lab 01/01/17 1630  WBC 7.0  HGB 15.0  HCT 43.9  MCV 88.9  PLT [id number]   Basic Metabolic Panel:  Recent Labs Lab 01/01/17 1630  NA 133*  K 3.6  CL 95*  CO2 25  GLUCOSE 110*  BUN 21*    CREATININE 1.65*  CALCIUM 9.9   GFR: CrCl cannot be calculated (Unknown ideal weight.). Liver Function Tests:  Recent Labs Lab 01/01/17 1630  AST 55*  ALT 43  ALKPHOS 68  BILITOT 0.6  PROT 6.6  ALBUMIN 3.8    Recent Labs Lab 01/01/17 1630  LIPASE 31   No results for input(s): AMMONIA in the last 168 hours. Coagulation Profile: No results for input(s): INR, PROTIME in the last 168 hours. Cardiac Enzymes: No results for input(s): CKTOTAL, CKMB, CKMBINDEX, TROPONINI in the last 168 hours. BNP (last 3 results) No results for input(s): PROBNP in the last 8760 hours. HbA1C: No results for input(s): HGBA1C in the last 72 hours. CBG: No results for input(s): GLUCAP in the last 168 hours. Lipid Profile: No results for input(s): CHOL, HDL, LDLCALC, TRIG, CHOLHDL, LDLDIRECT in the last 72 hours. Thyroid Function Tests: No results for input(s): TSH, T4TOTAL, FREET4, T3FREE, THYROIDAB in the last 72 hours. Anemia Panel: No results for input(s): VITAMINB12, FOLATE, FERRITIN, TIBC, IRON, RETICCTPCT  in the last 72 hours.  Sepsis Labs: @LABRCNTIP (procalcitonin:4,lacticidven:4) )No results found for this or any previous visit (from the past 240 hour(s)).   UA ordered  Lab Results  Component Value Date   HGBA1C 5.4 08/22/2016    CrCl cannot be calculated (Unknown ideal weight.).  BNP (last 3 results) No results for input(s): PROBNP in the last 8760 hours.   ECG REPORT  Independently reviewed Rate: 69  Rhythm: Normal sinus rhythm ST&T Change: No acute ischemic changes   QTC 450  There were no vitals filed for this visit.   Cultures: No results found for: SDES, [place], CULT, REPTSTATUS   Radiological Exams on Admission: Dg Chest 2 View  Result Date: 01/01/2017 CLINICAL DATA:  Cough, wheezing, fever and chills for 5 days, history hypertension, COPD, coronary artery disease, smoker EXAM: CHEST  2 VIEW COMPARISON:  10/14/2016 FINDINGS: Normal heart size,  mediastinal contours, and pulmonary vascularity. Chronic peribronchial thickening. Atherosclerotic calcification aortic arch. No acute infiltrate, pleural effusion, or pneumothorax. Prior cervicothoracic fusion. IMPRESSION: Chronic bronchitic changes without infiltrate. Aortic atherosclerosis. Electronically Signed   By: Lavonia Dana M.D.   On: 01/01/2017 17:59    Chart has been reviewed    Assessment/Plan   61 y.o. female with medical history significant of  COPD, CAD, essential Hypertension, OSA, GERD admitted for Acute influenza illness resulting in COPD exacerbation and acute renal failure    Present on Admission: . Influenza ATreat supportively and administer Tamiflu . Coronary artery disease stable continue home medications . Hypertension hold home medications given soft blood pressures initially were can't restart once blood pressure stabilize . COPD exacerbation ([facility]) -  - admitted there COPD Gold protocol  Will initiate: Steroid taper  -  Antibiotics azithromycin - Albuterol PRN, - scheduled duoneb,  -  Breo or Dulera at discharge   -  Mucinex.  Titrate O2 to saturation >90%. Follow patients respiratory status.    Currently mentating well no evidence of symptomatic hypercarbia OSA continue C Pap per home regimen . Acute renal failure (ARF) ([facility]) is secondary to dehydration will rehydrate and follow creatinine and obtain urine electrolytes . Dehydration minister IV fluids   Other plan as per orders.  DVT prophylaxis:   Lovenox     Code Status:  FULL CODE   as per patient    Family Communication:   Family not  at  Bedside   Disposition Plan:  To home once workup is complete and patient is stable                            Consults called: none    inpatient      Level of care    tele          I have spent a total of 57 min on this admission  Rahil Passey 01/01/2017, 7:20 PM    [facility]  Pager [phone]   after 2 AM please page floor coverage  PA If 7AM-7PM, please contact the day team taking care of the patient  [email or web address]  Password [email or web address]

## 2017-01-01 NOTE — Progress Notes (Signed)
Patient refused bed alarm. Patient was educated on reason for bed alarm and that it is our goal to keep her safe and injury free during her hospitalization. Patient verbalized understanding. Will continue to monitor patient.

## 2017-01-01 NOTE — Procedures (Signed)
Patient refused CPAP.  She said she does not wear at home and does not want to wear while in hospital.

## 2017-01-01 NOTE — ED Notes (Signed)
Pt noted at low sat of 84% placed on 2l/[place]. MD aware.

## 2017-01-01 NOTE — ED Triage Notes (Signed)
Patient comes from home for flu like symptoms and abdominal pain for last 4 days.  A&Ox4 with complaints of nausea.  No orthostatic changes for EMS with last BP of 101/61 and CBG of 133.

## 2017-01-01 NOTE — ED Provider Notes (Signed)
I saw and evaluated the patient, reviewed the resident's note and I agree with the findings and plan with the following exceptions.   exdosed to flu like symptoms last week and has had a few days of cough, sob, nausea, malaiose, body aches andfever since then.  On my exam, appears in norespiratory distress with sats in high 80's/low 90's after breathing treatment. Soft pressures as well. Lungs with faint wheeazing. Abdomen benign.  Suspect viral symptoms. Bronchitis v influenza.  Advise antipyretics, fluid bolus, continued breathing treatments and reeval of ysmptoms and VS.  Plan for flu swab. Will add on ecg as well.  If flu negative, low threshold for antibiotics/steroids.   EKG Interpretation  Date/Time:  Monday January 01 2017 17:02:55 EST Ventricular Rate:  69 PR Interval:    QRS Duration: 88 QT Interval:  420 QTC Calculation: 450 R Axis:   81 Text Interpretation:  Sinus rhythm Atrial premature complexes Borderline right axis deviation Confirmed by [facility] MD, Demitrius Crass [phone]) on 01/01/2017 11:21:04 PM         Merrily Pew, MD 01/01/17 2321

## 2017-01-01 NOTE — ED Provider Notes (Signed)
[facility] DEPT Provider Note  CSN: [id number] Arrival date & time: 01/01/17  1551  History   Chief Complaint Chief Complaint  Patient presents with  . Abdominal Pain    with flu like symptoms    HPI [name] is a 61 y.o. female.  The history is provided by the patient. No language interpreter was used.  Illness  This is a new problem. The current episode started more than 2 days ago. The problem occurs constantly. The problem has been gradually worsening. Associated symptoms include abdominal pain (cramping), headaches and shortness of breath. Pertinent negatives include no chest pain. Nothing aggravates the symptoms. Nothing relieves the symptoms.    Past Medical History:  Diagnosis Date  . Agoraphobia without history of panic disorder   . Anxiety   . Asthma   . Coronary artery disease CARDIOLOGIST- DR  Doylene Canard  (VISIT 03-30-11 W/ CHART)   NON-OBS. CAD   (STRESS TEST NOV. 2011  . DDD (degenerative disc disease)   . Depression   . DJD (degenerative joint disease)    JOINT PAIN  . Fibromyalgia   . Frequency of urination   . GERD (gastroesophageal reflux disease) AND HIATIAL HERNIA   CONTROLLED W/ NEXIUM  . Hemorrhoids   . History of gastric ulcer 2004  . Hypertension   . IBS (irritable bowel syndrome)   . Incisional pain s/p interstim implant 1st stage--- 12-07-11    left upper buttock-- pt states dressing clean dry and intact (on 12-08-11)  . Insomnia   . Iron deficiency anemia   . Nocturia   . Non-productive cough   . Numbness in both hands AT TIMES  . OSA on CPAP   . Urge urinary incontinence    Patient Active Problem List   Diagnosis Date Noted  . Influenza A 01/01/2017  . COPD exacerbation ([place]) 01/01/2017  . Acute renal failure (ARF) ([place]) 01/01/2017  . Dehydration 01/01/2017  . Unstable angina ([place]) 10/14/2016  . Chest pain 08/22/2016  . SVT (supraventricular tachycardia) ([place]) 08/22/2016  . OSA on CPAP   . Hypertension   . GERD  (gastroesophageal reflux disease)   . Depression   . Coronary artery disease   . Anxiety   . Pain in the chest   . Hypokalemia   . Carpal tunnel syndrome, bilateral 02/18/2016  . Leukocytosis 05/26/2014  . Tobacco abuse 05/26/2014  . Urge incontinence 12/14/2011   Past Surgical History:  Procedure Laterality Date  . CARDIAC CATHETERIZATION  2003  . CARDIAC CATHETERIZATION N/A 08/23/2016   Procedure: Left Heart Cath and Coronary Angiography;  Surgeon: Dixie Dials, MD;  Location: [facility];  Service: Cardiovascular;  Laterality: N/A;  . CERVICAL DISCECTOMY  2002   C4 - 7  . CERVICAL FUSION  2008   C4 - T1  . CHOLECYSTECTOMY  1982  . CYSTO/ HOD/ BLADDER BX  2006  . CYSTO/ HOD/ BLADDER BX/ FULGERATION  06-12-2011  . HYSTEROSCOPY W/D&C  2005  . INTERSTIM IMPLANT PLACEMENT  12/07/2011   Procedure: Barrie Lyme IMPLANT FIRST STAGE;  Surgeon: [name], MD;  Location: [facility];  Service: Urology;  Laterality: Right;  . INTERSTIM IMPLANT PLACEMENT  12/14/2011   Procedure: Barrie Lyme IMPLANT SECOND STAGE;  Surgeon: [name], MD;  Location: [facility];  Service: Urology;  Laterality: Right;  rad tech ok by vickie at [facility]  . LAMINECTOMY  2007   L3 - 5  . RIGHT THUMB SURG.  2001  .  TONSILLECTOMY AND ADENOIDECTOMY  1965  . WRIST SURGERY Left    [facility]   OB History    No data available      Home Medications    Prior to Admission medications   Medication Sig Start Date End Date Taking? Authorizing Provider  acetaminophen (TYLENOL) 650 MG CR tablet Take 1,300 mg by mouth 2 (two) times daily as needed for pain.   Yes Historical Provider, MD  albuterol (PROVENTIL HFA;VENTOLIN HFA) 108 (90 BASE) MCG/ACT inhaler Inhale 2 puffs into the lungs every 6 (six) hours as needed for wheezing or shortness of breath.   Yes Historical Provider, MD  Ascorbic Acid (VITAMIN C) 1000 MG tablet Take 1,000 mg by mouth daily.   Yes Historical Provider, MD    aspirin EC 81 MG EC tablet Take 1 tablet (81 mg total) by mouth daily. 10/18/16  Yes Dixie Dials, MD  buPROPion (WELLBUTRIN XL) 150 MG 24 hr tablet Take 150 mg by mouth daily.    Yes Historical Provider, MD  cholecalciferol (VITAMIN D) 1000 UNITS tablet Take 1,000 Units by mouth daily.    Yes Historical Provider, MD  clonazePAM (KLONOPIN) 1 MG tablet Take 1 mg by mouth 2 (two) times daily. 05/10/15  Yes Historical Provider, MD  diltiazem (CARDIZEM CD) 120 MG 24 hr capsule Take 1 capsule (120 mg total) by mouth daily. 10/18/16  Yes Dixie Dials, MD  DULoxetine (CYMBALTA) 60 MG capsule Take 60 mg by mouth at bedtime.    Yes Historical Provider, MD  fenofibrate (TRICOR) 48 MG tablet Take 48 mg by mouth daily.  04/30/15  Yes Historical Provider, MD  ferrous sulfate 325 (65 FE) MG tablet Take 325 mg by mouth daily with breakfast.   Yes Historical Provider, MD  guaiFENesin (MUCINEX) 600 MG 12 hr tablet Take 1 tablet (600 mg total) by mouth 2 (two) times daily. Patient taking differently: Take 600 mg by mouth 2 (two) times daily as needed for cough or to loosen phlegm.  10/17/16  Yes Dixie Dials, MD  HYDROcodone-acetaminophen (NORCO) 5-325 MG per tablet Take 1 tablet by mouth 2 (two) times daily as needed for pain.    Yes Historical Provider, MD  isosorbide mononitrate (IMDUR) 60 MG 24 hr tablet Take 0.5 tablets (30 mg total) by mouth daily. 10/17/16  Yes Dixie Dials, MD  loperamide (IMODIUM) 2 MG capsule Take 1 capsule (2 mg total) by mouth 4 (four) times daily as needed for diarrhea or loose stools. 08/27/16  Yes Forde Dandy, MD  Magnesium 500 MG CAPS Take 500 mg by mouth daily.    Yes Historical Provider, MD  metoprolol tartrate (LOPRESSOR) 25 MG tablet Take 0.5 tablets (12.5 mg total) by mouth at bedtime. 10/17/16  Yes Dixie Dials, MD  nitroGLYCERIN (NITROSTAT) 0.4 MG SL tablet Place 1 tablet (0.4 mg total) under the tongue every 5 (five) minutes x 3 doses as needed for chest pain. 08/24/16  Yes Eugenie Filler, MD  ondansetron (ZOFRAN) 4 MG tablet Take 1 tablet (4 mg total) by mouth every 6 (six) hours as needed for nausea or vomiting. 08/27/16  Yes Forde Dandy, MD  pantoprazole (PROTONIX) 40 MG tablet Take 1 tablet (40 mg total) by mouth daily as needed. Patient taking differently: Take 40 mg by mouth daily as needed (for acid reflux).  08/24/16  Yes Eugenie Filler, MD  SUPER B COMPLEX/C PO Take 1 tablet by mouth daily.    Yes Historical Provider, MD  nicotine (NICODERM CQ -  DOSED IN MG/24 HOURS) 21 mg/24hr patch Place 1 patch (21 mg total) onto the skin daily. Patient not taking: Reported on 01/01/2017 08/24/16   Eugenie Filler, MD   Family History Family History  Problem Relation Age of Onset  . Hypertension Father   . Heart disease Father   . Kidney failure Father   . Breast cancer Mother   . Hypertension Mother   . Coronary artery disease Mother   . Alzheimer's disease Mother   . Hypertension Brother   . Hypertension Sister   . Hypertension Daughter    Social History Social History  Substance Use Topics  . Smoking status: Current Every Day Smoker    Packs/day: 2.00    Years: 30.00  . Smokeless tobacco: Never Used  . Alcohol use No     Comment: OCCASIONAL    Allergies   Midazolam hcl; Prednisone; Statins; Ace inhibitors; Amoxicillin; Nsaids; and Sulfa antibiotics  Review of Systems Review of Systems  Constitutional: Positive for appetite change (decreased), chills, fatigue and fever (Tmax 101.79F).  HENT: Positive for congestion and rhinorrhea.   Respiratory: Positive for cough, shortness of breath and wheezing.   Cardiovascular: Negative for chest pain, palpitations and leg swelling.  Gastrointestinal: Positive for abdominal pain (cramping), diarrhea and nausea. Negative for vomiting.  Genitourinary: Positive for frequency (baseline, unchanged). Negative for dysuria.  Musculoskeletal: Positive for myalgias.  Neurological: Positive for headaches.  All other  systems reviewed and are negative.   Physical Exam Updated Vital Signs BP 104/64 (BP Location: Right Arm)   Pulse (!) 57   Temp 98.5 F (36.9 C) (Oral)   Resp 18   Wt 63 kg Comment: scale b  LMP 09/06/2005   SpO2 97%   BMI 28.03 kg/m   Physical Exam  Constitutional: She is oriented to person, place, and time. No distress.  Overweight elderly Caucasian female  HENT:  Head: Normocephalic and atraumatic.  Eyes: EOM are normal. Pupils are equal, round, and reactive to light.  Neck: Normal range of motion. Neck supple.  Cardiovascular: Normal rate, regular rhythm and normal heart sounds.   Pulmonary/Chest: Effort normal and breath sounds normal.  No tachypnea, mild increased work of breathing, prolonged expiratory phase, faint and expiratory wheeze throughout, maintaining sats on room air  Abdominal: Soft. Bowel sounds are normal. She exhibits no distension. There is no tenderness.  Musculoskeletal: Normal range of motion.  Neurological: She is alert and oriented to person, place, and time.  Skin: Skin is warm and dry. Capillary refill takes less than 2 seconds. She is not diaphoretic.  Nursing note and vitals reviewed.   ED Treatments / Results  Labs (all labs ordered are listed, but only abnormal results are displayed) Labs Reviewed  COMPREHENSIVE METABOLIC PANEL - Abnormal; Notable for the following:       Result Value   Sodium 133 (*)    Chloride 95 (*)    Glucose, Bld 110 (*)    BUN 21 (*)    Creatinine, Ser 1.65 (*)    AST 55 (*)    GFR calc non Af Amer 32 (*)    GFR calc Af Amer 37 (*)    All other components within normal limits  URINALYSIS, ROUTINE W REFLEX MICROSCOPIC - Abnormal; Notable for the following:    Specific Gravity, Urine 1.003 (*)    All other components within normal limits  INFLUENZA PANEL BY PCR (TYPE A & B) - Abnormal; Notable for the following:    Influenza A  By PCR POSITIVE (*)    All other components within normal limits  I-STAT CG4 LACTIC  ACID, ED - Abnormal; Notable for the following:    Lactic Acid, Venous 2.55 (*)    All other components within normal limits  CULTURE, BLOOD (ROUTINE X 2)  CULTURE, BLOOD (ROUTINE X 2)  LIPASE, BLOOD  CBC  LACTIC ACID, PLASMA  LACTIC ACID, PLASMA  PROCALCITONIN  MAGNESIUM  PHOSPHORUS  TSH  COMPREHENSIVE METABOLIC PANEL  CBC  CREATININE, URINE, RANDOM  SODIUM, URINE, RANDOM   EKG  EKG Interpretation None      Radiology Dg Chest 2 View  Result Date: 01/01/2017 CLINICAL DATA:  Cough, wheezing, fever and chills for 5 days, history hypertension, COPD, coronary artery disease, smoker EXAM: CHEST  2 VIEW COMPARISON:  10/14/2016 FINDINGS: Normal heart size, mediastinal contours, and pulmonary vascularity. Chronic peribronchial thickening. Atherosclerotic calcification aortic arch. No acute infiltrate, pleural effusion, or pneumothorax. Prior cervicothoracic fusion. IMPRESSION: Chronic bronchitic changes without infiltrate. Aortic atherosclerosis. Electronically Signed   By: Lavonia Dana M.D.   On: 01/01/2017 17:59   Dg Chest Port 1 View  Result Date: 01/01/2017 CLINICAL DATA:  Sepsis, history of asthma.  Smoker. EXAM: PORTABLE CHEST 1 VIEW COMPARISON:  Chest radiograph January 01, 2017 FINDINGS: Cardiomediastinal silhouette is normal. Mildly calcified aortic knob. No pleural effusions or focal consolidations. Mild bronchitic changes. Trachea projects midline and there is no pneumothorax. Soft tissue planes and included osseous structures are non-suspicious. Multilevel ACDF. Surgical clips in the included right abdomen compatible with cholecystectomy. IMPRESSION: Mild chronic bronchitic changes without focal consolidation. Electronically Signed   By: Elon Alas M.D.   On: 01/01/2017 20:57    Procedures Procedures (including critical care time)  Medications Ordered in ED Medications  aspirin EC tablet 81 mg (not administered)  diltiazem (CARDIZEM CD) 24 hr capsule 120 mg (not  administered)  clonazePAM (KLONOPIN) tablet 1 mg (1 mg Oral Given 01/01/17 2214)  fenofibrate tablet 54 mg (not administered)  buPROPion (WELLBUTRIN XL) 24 hr tablet 150 mg (not administered)  DULoxetine (CYMBALTA) DR capsule 60 mg (60 mg Oral Given 01/01/17 2214)  HYDROcodone-acetaminophen (NORCO/VICODIN) 5-325 MG per tablet 1 tablet (not administered)  sodium chloride flush (NS) 0.9 % injection 3 mL (3 mLs Intravenous Not Given 01/01/17 2216)  acetaminophen (TYLENOL) tablet 650 mg (not administered)    Or  acetaminophen (TYLENOL) suppository 650 mg (not administered)  ondansetron (ZOFRAN) tablet 4 mg (not administered)    Or  ondansetron (ZOFRAN) injection 4 mg (not administered)  nicotine (NICODERM CQ - dosed in mg/24 hours) patch 14 mg (14 mg Transdermal Patch Applied 01/01/17 2214)  methylPREDNISolone sodium succinate (SOLU-MEDROL) 125 mg/2 mL injection 60 mg (60 mg Intravenous Given 01/01/17 2215)    Followed by  predniSONE (DELTASONE) tablet 40 mg (not administered)  albuterol (PROVENTIL) (2.5 MG/3ML) 0.083% nebulizer solution 2.5 mg (not administered)  enoxaparin (LOVENOX) injection 30 mg (30 mg Subcutaneous Given 01/01/17 2215)  0.9 %  sodium chloride infusion ( Intravenous Rate/Dose Change 01/01/17 2200)  guaiFENesin (MUCINEX) 12 hr tablet 600 mg (600 mg Oral Given 01/01/17 2214)  azithromycin (ZITHROMAX) 500 mg in dextrose 5 % 250 mL IVPB (not administered)  oseltamivir (TAMIFLU) capsule 30 mg (30 mg Oral Given 01/01/17 2214)  feeding supplement (ENSURE ENLIVE) (ENSURE ENLIVE) liquid 237 mL (not administered)  ipratropium-albuterol (DUONEB) 0.5-2.5 (3) MG/3ML nebulizer solution 3 mL (not administered)  albuterol (PROVENTIL) (2.5 MG/3ML) 0.083% nebulizer solution 2.5 mg (2.5 mg Nebulization Given 01/01/17 1633)  oxymetazoline (  AFRIN) 0.05 % nasal spray 1 spray (1 spray Each Nare Given 01/01/17 1633)  benzonatate (TESSALON) capsule 100 mg (100 mg Oral Given 01/01/17 1630)  acetaminophen  (TYLENOL) tablet 1,000 mg (1,000 mg Oral Given 01/01/17 1630)  sodium chloride 0.9 % bolus 1,000 mL (0 mLs Intravenous Stopped 01/01/17 1734)  ondansetron (ZOFRAN) injection 4 mg (4 mg Intravenous Given 01/01/17 1632)  sodium chloride 0.9 % bolus 1,000 mL (0 mLs Intravenous Stopped 01/01/17 1905)  oseltamivir (TAMIFLU) capsule 75 mg (75 mg Oral Given 01/01/17 1813)  albuterol (PROVENTIL) (2.5 MG/3ML) 0.083% nebulizer solution 2.5 mg (2.5 mg Nebulization Given 01/01/17 1914)  azithromycin (ZITHROMAX) 500 mg in dextrose 5 % 250 mL IVPB (500 mg Intravenous New Bag/Given 01/01/17 1913)  0.9 %  sodium chloride infusion (100 mL/hr Intravenous New Bag/Given 01/01/17 1913)    Initial Impression / Assessment and Plan / ED Course  I have reviewed the triage vital signs and the nursing notes.  Pertinent labs & imaging results that were available during my care of the patient were reviewed by me and considered in my medical decision making (see chart for details).  Clinical Course   61 y.o. female with above stated PMHx, HPI, and physical. History as above.  Patient given Afrin for nasal congestion, Tylenol for headache bodyaches, and Tessalon Perles for cough. RT provided nebulous her treatment to patient. Influenza influenza positive. Patient's blood pressure soft. Patient given IV fluids. Blood cultures drawn. Chest x-ray showing no evidence of pneumonia. LA 2.5. Blood pressure he improved following 2 L normal saline. Patient given azithromycin for COPD exacerbation.  Laboratory and imaging results were personally reviewed by myself and used in the medical decision making of this patient's treatment and disposition.  Pt admitted to medicine for further evaluation and management of influenza & COPD exacerbation with dehydration. Pt understands and agrees with the plan and has no further questions or concerns.   Pt care discussed with and followed by my attending, Dr. Merrily Pew  Mayer Camel, MD Pager  [phone]  Final Clinical Impressions(s) / ED Diagnoses  Influenza COPD exacerbation AKI Lactic acidosis Dehydration  New Prescriptions Current Discharge Medication List       Mayer Camel, MD 01/01/17 [id number]    Merrily Pew, MD 01/01/17 2322

## 2017-01-02 LAB — COMPREHENSIVE METABOLIC PANEL
ALK PHOS: 65 U/L (ref 38–126)
ALT: 39 U/L (ref 14–54)
ANION GAP: 9 (ref 5–15)
AST: 45 U/L — ABNORMAL HIGH (ref 15–41)
Albumin: 3.8 g/dL (ref 3.5–5.0)
BILIRUBIN TOTAL: 0.4 mg/dL (ref 0.3–1.2)
BUN: 14 mg/dL (ref 6–20)
CO2: 26 mmol/L (ref 22–32)
CREATININE: 0.97 mg/dL (ref 0.44–1.00)
Calcium: 9.1 mg/dL (ref 8.9–10.3)
Chloride: 104 mmol/L (ref 101–111)
Glucose, Bld: 149 mg/dL — ABNORMAL HIGH (ref 65–99)
Potassium: 3.8 mmol/L (ref 3.5–5.1)
Sodium: 139 mmol/L (ref 135–145)
TOTAL PROTEIN: 6.4 g/dL — AB (ref 6.5–8.1)

## 2017-01-02 LAB — TSH: TSH: 0.505 u[IU]/mL (ref 0.350–4.500)

## 2017-01-02 LAB — CBC
HCT: 42.7 % (ref 36.0–46.0)
HEMOGLOBIN: 14.1 g/dL (ref 12.0–15.0)
MCH: 29.4 pg (ref 26.0–34.0)
MCHC: 33 g/dL (ref 30.0–36.0)
MCV: 89.1 fL (ref 78.0–100.0)
PLATELETS: 176 10*3/uL (ref 150–400)
RBC: 4.79 MIL/uL (ref 3.87–5.11)
RDW: 13.4 % (ref 11.5–15.5)
WBC: 4 10*3/uL (ref 4.0–10.5)

## 2017-01-02 LAB — PHOSPHORUS: Phosphorus: 3.2 mg/dL (ref 2.5–4.6)

## 2017-01-02 LAB — MAGNESIUM: MAGNESIUM: 2.1 mg/dL (ref 1.7–2.4)

## 2017-01-02 LAB — LACTIC ACID, PLASMA: LACTIC ACID, VENOUS: 1.7 mmol/L (ref 0.5–1.9)

## 2017-01-02 LAB — CREATININE, URINE, RANDOM: Creatinine, Urine: 66.1 mg/dL

## 2017-01-02 LAB — SODIUM, URINE, RANDOM

## 2017-01-02 MED ORDER — ORAL CARE MOUTH RINSE
15.0000 mL | Freq: Two times a day (BID) | OROMUCOSAL | Status: DC
  Administered 2017-01-02 – 2017-01-04 (×3): 15 mL via OROMUCOSAL

## 2017-01-02 MED ORDER — OSELTAMIVIR PHOSPHATE 75 MG PO CAPS
75.0000 mg | ORAL_CAPSULE | Freq: Two times a day (BID) | ORAL | Status: DC
  Administered 2017-01-02 – 2017-01-04 (×5): 75 mg via ORAL
  Filled 2017-01-02 (×5): qty 1

## 2017-01-02 MED ORDER — GUAIFENESIN 100 MG/5ML PO SOLN
5.0000 mL | ORAL | Status: DC | PRN
  Administered 2017-01-02 (×2): 100 mg via ORAL
  Filled 2017-01-02 (×2): qty 5

## 2017-01-02 MED ORDER — ENOXAPARIN SODIUM 40 MG/0.4ML ~~LOC~~ SOLN
40.0000 mg | SUBCUTANEOUS | Status: DC
  Administered 2017-01-02 – 2017-01-03 (×2): 40 mg via SUBCUTANEOUS
  Filled 2017-01-02 (×2): qty 0.4

## 2017-01-02 NOTE — Evaluation (Signed)
Occupational Therapy Evaluation Patient Details Name: [name] MRN: [id number] DOB: May 06, 1956 Today's Date: 01/02/2017    History of Present Illness 61 yo female with onset of influenza for the last 4 days, cough and SOB. Does not use cpap and is a smoker.  Recent palpitations and declining HR.  PMHx: COPD, CAD, HTN, OSA,    Clinical Impression   Pt is performing ADL with set up and basic transfers for toileting modified independently. Provided min guard assist for ambulation as pt reporting some mild lightheadedness. No further OT needs. Anticipate pt will return to independence as her medical issues resolve.     Follow Up Recommendations  No OT follow up    Equipment Recommendations  None recommended by OT    Recommendations for Other Services       Precautions / Restrictions Precautions Precautions: Fall Restrictions Weight Bearing Restrictions: No      Mobility Bed Mobility Overal bed mobility: Modified Independent                Transfers Overall transfer level: Modified independent Equipment used: None                  Balance Overall balance assessment: Needs assistance Sitting-balance support: Feet supported Sitting balance-Leahy Scale: Good     Standing balance support: Single extremity supported Standing balance-Leahy Scale: Fair                              ADL Overall ADL's : Needs assistance/impaired Eating/Feeding: Independent;Bed level   Grooming: Wash/dry hands;Standing;Supervision/safety   Upper Body Bathing: Set up;Sitting   Lower Body Bathing: Sit to/from stand;Set up   Upper Body Dressing : Set up;Sitting   Lower Body Dressing: Sit to/from stand;Set up   Toilet Transfer: Modified Independent;Stand-pivot;BSC Toilet Transfer Details (indicate cue type and reason): pt has been performing stand pivot to [facility] independently Toileting- Clothing Manipulation and Hygiene: Sit to/from stand;Modified  independent       Functional mobility during ADLs: Min guard (due to pt feeling light headed)       Acupuncturist     Praxis      Pertinent Vitals/Pain Pain Assessment: Faces Faces Pain Scale: Hurts little more Pain Location: R ear Pain Descriptors / Indicators: Aching Pain Intervention(s): Monitored during session;Premedicated before session     Hand Dominance Right   Extremity/Trunk Assessment Upper Extremity Assessment Upper Extremity Assessment: Overall WFL for tasks assessed   Lower Extremity Assessment Lower Extremity Assessment: Overall WFL for tasks assessed   Cervical / Trunk Assessment Cervical / Trunk Assessment: Normal   Communication Communication Communication: No difficulties   Cognition Arousal/Alertness: Awake/alert Behavior During Therapy: WFL for tasks assessed/performed Overall Cognitive Status: Within Functional Limits for tasks assessed                     General Comments       Exercises      Shoulder Instructions      Home Living Family/patient expects to be discharged to:: Private residence Living Arrangements: Children Available Help at Discharge: Family;Available PRN/intermittently Type of Home: House Home Access: Stairs to enter [employer] of Steps: 4+2 Entrance Stairs-Rails: Right;Left;Can reach both;None Home Layout: One level     Bathroom Shower/Tub: Teacher, early years/pre: Standard     Home Equipment: None          Prior Functioning/Environment Level  of Independence: Independent                 OT Problem List: Pain;Decreased activity tolerance   OT Treatment/Interventions:      OT Goals(Current goals can be found in the care plan section) Acute Rehab OT Goals Patient Stated Goal: to avoid falls in the future  OT Frequency:     Barriers to D/C:            Co-evaluation              End of Session    Activity Tolerance: Patient tolerated  treatment well Patient left: in bed;with call bell/phone within reach   Time: 1041-1102 OT Time Calculation (min): 21 min Charges:  OT General Charges $OT Visit: 1 Procedure OT Evaluation $OT Eval Low Complexity: 1 Procedure G-Codes:    Malka So 01/02/2017, 11:10 AM  [phone]

## 2017-01-02 NOTE — Evaluation (Signed)
Physical Therapy Evaluation Patient Details Name: [name] MRN: [id number] DOB: [birth year] Today's Date: 01/02/2017   History of Present Illness  61 yo female with onset of influenza for the last 4 days, cough and SOB. Does not use cpap and is a smoker.  Recent palpitations and declining HR.  PMHx: COPD, CAD, HTN, OSA,   Clinical Impression  Pt was seen for evaluation of her mobility and balance, and noted her likelihood of falling is higher due to lightheadedness and declining of assistance for safety.  Will follow acutely for balance work with gait, and do not expect now to need to fit an AD due to her issues mainly with her lightheaded feelings.  Outpatient for her ankle issues not related to strength.    Follow Up Recommendations Outpatient PT (to work on her balance due to pt reporting falls from ankles)    Equipment Recommendations  None recommended by PT    Recommendations for Other Services       Precautions / Restrictions Precautions Precautions: Fall (telemetry) Restrictions Weight Bearing Restrictions: No      Mobility  Bed Mobility Overal bed mobility: Modified Independent (extra time )                Transfers Overall transfer level: Modified independent Equipment used: None                Ambulation/Gait Ambulation/Gait assistance: Min guard (due to complaints of light headedness) Ambulation Distance (Feet): 60 Feet (30 x 2) Assistive device: 1 person hand held assist Gait Pattern/deviations: WFL(Within Functional Limits) Gait velocity: normal Gait velocity interpretation: at or above normal speed for age/gender General Gait Details: pt was guarded due to light headedness but complains she has slept poorly and is having to void often  Stairs            Wheelchair Mobility    Modified Rankin (Stroke Patients Only)       Balance Overall balance assessment: Needs assistance Sitting-balance support: Feet supported Sitting  balance-Leahy Scale: Good     Standing balance support: Single extremity supported Standing balance-Leahy Scale: Fair                               Pertinent Vitals/Pain Pain Assessment: No/denies pain    Home Living Family/patient expects to be discharged to:: Private residence Living Arrangements: Children Available Help at Discharge: Family;Available PRN/intermittently Type of Home: House Home Access: Stairs to enter Entrance Stairs-Rails: Right;Left;Can reach both;None (rails on her deck stairs (4) and none at the house (2)) Entrance Stairs-Number of Steps: 4+2 Home Layout: One level Home Equipment: None      Prior Function Level of Independence: Independent               Hand Dominance        Extremity/Trunk Assessment   Upper Extremity Assessment Upper Extremity Assessment: Overall WFL for tasks assessed    Lower Extremity Assessment Lower Extremity Assessment: Overall WFL for tasks assessed    Cervical / Trunk Assessment Cervical / Trunk Assessment: Normal  Communication   Communication: No difficulties  Cognition Arousal/Alertness: Awake/alert Behavior During Therapy: WFL for tasks assessed/performed Overall Cognitive Status: Within Functional Limits for tasks assessed                      General Comments General comments (skin integrity, edema, etc.): Pt is not feeling well due to flu and  has some light headedness that does not appear to have reflected on her telemetry unit (pulse 72-74) and no SOB    Exercises     Assessment/Plan    PT Assessment Patient needs continued PT services  PT Problem List Decreased balance;Decreased mobility;Decreased safety awareness          PT Treatment Interventions DME instruction;Gait training;Stair training;Functional mobility training;Therapeutic activities;Therapeutic exercise;Balance training;Neuromuscular re-education;Patient/family education    PT Goals (Current goals can be  found in the Care Plan section)  Acute Rehab PT Goals Patient Stated Goal: to avoid falls in the future PT Goal Formulation: With patient Time For Goal Achievement: 01/09/17 Potential to Achieve Goals: Good    Frequency Min 2X/week   Barriers to discharge Decreased caregiver support daughter works full time    Co-evaluation               End of Session Equipment Utilized During Treatment: Gait belt Activity Tolerance: Patient tolerated treatment well;Treatment limited secondary to medical complications (Comment) (lightheadedness) Patient left: in bed;with call bell/phone within reach (refused bed alarm and has nearby [facility]) Nurse Communication: Mobility status         Time: [id number] PT Time Calculation (min) (ACUTE ONLY): 27 min   Charges:   PT Evaluation $PT Eval Low Complexity: 1 Procedure PT Treatments $Gait Training: 8-22 mins   PT G CodesRamond Dial 01/10/2017, 10:40 AM   Mee Hives, PT MS Acute Rehab Dept. Number: [place] and [place]

## 2017-01-02 NOTE — Progress Notes (Signed)
Pt educated on the safety importance, yet continues to refuse bed/chair alarms.

## 2017-01-02 NOTE — Progress Notes (Signed)
Patient refused CPAP.  She said she does not wear at home and does not want to wear while in hospital.

## 2017-01-02 NOTE — Progress Notes (Signed)
PROGRESS NOTE  [name] [id number] DOB: 1956-04-02 DOA: 01/01/2017 PCP: [name], MD   LOS: 1 day   Brief Narrative: 61 yo F with COPD, active smoker, CAD, HTN, admitted 1/15 with Influenza  Assessment & Plan: Active Problems:   OSA on CPAP   Hypertension   Coronary artery disease   Influenza A   COPD exacerbation (HCC)   Acute renal failure (ARF) (HCC)   Dehydration   Influenza A - continue Tamiflu, she is afebrile this morning, continues to have significant coughing and overall continues to feel poorly.  CAD - no chest pain, stable  COPD exacerbation - no wheezing this morning, continue Steroid taper, continue DuoNeb's  AKI - due to poor po intake, Cr normalized with fluids  HTN - resume home medications, on diltiazem  Acute hypoxic respiratory failure - wean off oxygen as tolerated   Tobacco abuse - counseled for cessation    DVT prophylaxis: Lovenox Code Status: Full Family Communication: no family at bedside Disposition Plan: home when ready  Consultants:   None   Procedures:   None   Antimicrobials:  Azithromycin 1/15 >>  Tamiflu 1/15 >>  Subjective: - Feels weak, coughing, shortness of breath with ambulation  Objective: Vitals:   01/02/17 0138 01/02/17 0700 01/02/17 0955 01/02/17 1139  BP: 103/61 (!) 119/52  (!) 126/52  Pulse: 80 (!) 57  74  Resp: 18 18  18   Temp: 97.8 F (36.6 C) 97.3 F (36.3 C)  97.7 F (36.5 C)  TempSrc: Oral Oral  Oral  SpO2: 96% 97% 97% 91%  Weight:  61.6 kg (135 lb 12.8 oz)    Height:        Intake/Output Summary (Last 24 hours) at 01/02/17 1151 Last data filed at 01/02/17 0947  Gross per 24 hour  Intake             1942 ml  Output                0 ml  Net             1942 ml   Filed Weights   01/01/17 2015 01/02/17 0700  Weight: 63 kg (138 lb 12.8 oz) 61.6 kg (135 lb 12.8 oz)    Examination: Constitutional: NAD Vitals:   01/02/17 0138 01/02/17 0700 01/02/17 0955 01/02/17 1139  BP:  103/61 (!) 119/52  (!) 126/52  Pulse: 80 (!) 57  74  Resp: 18 18  18   Temp: 97.8 F (36.6 C) 97.3 F (36.3 C)  97.7 F (36.5 C)  TempSrc: Oral Oral  Oral  SpO2: 96% 97% 97% 91%  Weight:  61.6 kg (135 lb 12.8 oz)    Height:       Eyes: PERRL, lids and conjunctivae normal Respiratory: clear to auscultation bilaterally, no wheezing, no crackles.  Cardiovascular: Regular rate and rhythm, no murmurs / rubs / gallops.  Abdomen: no tenderness. Bowel sounds positive.  Musculoskeletal: no clubbing / cyanosis Neurologic: non focal     Data Reviewed: I have personally reviewed following labs and imaging studies  CBC:  Recent Labs Lab 01/01/17 1630 01/02/17 0300  WBC 7.0 4.0  HGB 15.0 14.1  HCT 43.9 42.7  MCV 88.9 89.1  PLT 236 0000000   Basic Metabolic Panel:  Recent Labs Lab 01/01/17 1630 01/02/17 0300  NA 133* 139  K 3.6 3.8  CL 95* 104  CO2 25 26  GLUCOSE 110* 149*  BUN 21* 14  CREATININE 1.65* 0.97  CALCIUM 9.9 9.1  MG  --  2.1  PHOS  --  3.2   GFR: Estimated Creatinine Clearance: 51.9 mL/min (by C-G formula based on SCr of 0.97 mg/dL). Liver Function Tests:  Recent Labs Lab 01/01/17 1630 01/02/17 0300  AST 55* 45*  ALT 43 39  ALKPHOS 68 65  BILITOT 0.6 0.4  PROT 6.6 6.4*  ALBUMIN 3.8 3.8    Recent Labs Lab 01/01/17 1630  LIPASE 31   No results for input(s): AMMONIA in the last 168 hours. Coagulation Profile: No results for input(s): INR, PROTIME in the last 168 hours. Cardiac Enzymes: No results for input(s): CKTOTAL, CKMB, CKMBINDEX, TROPONINI in the last 168 hours. BNP (last 3 results) No results for input(s): PROBNP in the last 8760 hours. HbA1C: No results for input(s): HGBA1C in the last 72 hours. CBG: No results for input(s): GLUCAP in the last 168 hours. Lipid Profile: No results for input(s): CHOL, HDL, LDLCALC, TRIG, CHOLHDL, LDLDIRECT in the last 72 hours. Thyroid Function Tests:  Recent Labs  01/02/17 0300  TSH 0.505    Anemia Panel: No results for input(s): VITAMINB12, FOLATE, FERRITIN, TIBC, IRON, RETICCTPCT in the last 72 hours. Urine analysis:    Component Value Date/Time   COLORURINE YELLOW 01/01/2017 2001   APPEARANCEUR CLEAR 01/01/2017 2001   LABSPEC 1.003 (L) 01/01/2017 2001   PHURINE 6.0 01/01/2017 2001   GLUCOSEU NEGATIVE 01/01/2017 2001   HGBUR NEGATIVE 01/01/2017 2001   [place] NEGATIVE 01/01/2017 2001   [place] 01/01/2017 2001   PROTEINUR NEGATIVE 01/01/2017 2001   UROBILINOGEN 0.2 09/17/2008 2342   NITRITE NEGATIVE 01/01/2017 2001   LEUKOCYTESUR NEGATIVE 01/01/2017 2001   Sepsis Labs: Invalid input(s): PROCALCITONIN, LACTICIDVEN  No results found for this or any previous visit (from the past 240 hour(s)).    Radiology Studies: Dg Chest 2 View  Result Date: 01/01/2017 CLINICAL DATA:  Cough, wheezing, fever and chills for 5 days, history hypertension, COPD, coronary artery disease, smoker EXAM: CHEST  2 VIEW COMPARISON:  10/14/2016 FINDINGS: Normal heart size, mediastinal contours, and pulmonary vascularity. Chronic peribronchial thickening. Atherosclerotic calcification aortic arch. No acute infiltrate, pleural effusion, or pneumothorax. Prior cervicothoracic fusion. IMPRESSION: Chronic bronchitic changes without infiltrate. Aortic atherosclerosis. Electronically Signed   By: Lavonia Dana M.D.   On: 01/01/2017 17:59   Dg Chest Port 1 View  Result Date: 01/01/2017 CLINICAL DATA:  Sepsis, history of asthma.  Smoker. EXAM: PORTABLE CHEST 1 VIEW COMPARISON:  Chest radiograph January 01, 2017 FINDINGS: Cardiomediastinal silhouette is normal. Mildly calcified aortic knob. No pleural effusions or focal consolidations. Mild bronchitic changes. Trachea projects midline and there is no pneumothorax. Soft tissue planes and included osseous structures are non-suspicious. Multilevel ACDF. Surgical clips in the included right abdomen compatible with cholecystectomy. IMPRESSION: Mild  chronic bronchitic changes without focal consolidation. Electronically Signed   By: Elon Alas M.D.   On: 01/01/2017 20:57     Scheduled Meds: . aspirin EC  81 mg Oral Daily  . azithromycin  500 mg Intravenous Q24H  . buPROPion  150 mg Oral Daily  . clonazePAM  1 mg Oral BID  . diltiazem  120 mg Oral Daily  . DULoxetine  60 mg Oral QHS  . enoxaparin (LOVENOX) injection  30 mg Subcutaneous Q24H  . feeding supplement (ENSURE ENLIVE)  237 mL Oral BID BM  . fenofibrate  54 mg Oral Daily  . guaiFENesin  600 mg Oral BID  . ipratropium-albuterol  3 mL Nebulization TID  . mouth rinse  15 mL Mouth Rinse BID  . nicotine  14 mg Transdermal Q24H  . oseltamivir  30 mg Oral Q24H  . predniSONE  40 mg Oral Q supper  . sodium chloride flush  3 mL Intravenous Q12H   Continuous Infusions:   Marzetta Board, MD, PhD [facility] Pager [phone]  If 7PM-7AM, please contact night-coverage [email or web address] Password [email or web address] 01/02/2017, 11:51 AM

## 2017-01-02 NOTE — Progress Notes (Signed)
Pt continues to refuse bed alarm. Will continue to monitor. 

## 2017-01-02 NOTE — Progress Notes (Signed)
Initial Nutrition Assessment   INTERVENTION:  Continue Ensure Enlive po BID, each supplement provides 350 kcal and 20 grams of protein Provide Multivitamin with minerals daily  Provided and discussed "Heart Healthy Nutrition Therapy" handout from the Academy of [employer] Dietetics   NUTRITION DIAGNOSIS:   Inadequate oral intake related to poor appetite, chronic illness as evidenced by percent weight loss, per patient/family report, mild depletion of muscle mass.   GOAL:   Patient will meet greater than or equal to 90% of their needs   MONITOR:   PO intake, Labs, Weight trends, Skin, I & O's  REASON FOR ASSESSMENT:   Malnutrition Screening Tool, Consult COPD Protocol  ASSESSMENT:   61 y.o. female with medical history significant of  COPD, CAD, essential Hypertension, OSA, GERD presents with history of 4 days of flu-like symptoms with abdominal pain and nausea  Pt reports that she has had a decreased appetite for several months which has worsened in the past 4 days due to acute illness. She states that she typically snacks on some graham crackers during the day and eats one large meal at night, but if she eats too much she vomits due to GERD. She states that she used to weigh 150 lbs. Weight history shows that pt has lost 10 lbs/7% of her body weight in the past 4 months. She has mild muscle wasting per nutrition-focused physical exam. RD discussed the importance of consistent and adequate healthful PO intake. Pt requested information regarding heart healthy eating, stating that she eats out with her family a lot. Encouraged intake of fruits, vegetables, nuts, beans, and healthy plant-based fats. Reviewed sodium guidelines for sodium per serving of food. Provided and discussed "Heart Healthy Nutrition Therapy" handout from the Academy of Nutrition and Dietetics. RD name and contact information provided.   Labs reviewed.    Diet Order:  Diet Heart Room service appropriate? Yes;  Fluid consistency: Thin  Skin:  Reviewed, no issues  Last BM:  1/15  Height:   Ht Readings from Last 1 Encounters:  01/01/17 5\' 4"  (1.626 m)    Weight:   Wt Readings from Last 1 Encounters:  01/02/17 135 lb 12.8 oz (61.6 kg)    Ideal Body Weight:  54.55 kg  BMI:  Body mass index is 23.31 kg/m.  Estimated Nutritional Needs:   Kcal:  1650-1850  Protein:  90-100 grams  Fluid:  1.9 L/day  EDUCATION NEEDS:   Education needs addressed  Scarlette Ar RD, CSP, LDN Inpatient Clinical Dietitian Pager: [phone] After Hours Pager: [phone]

## 2017-01-02 NOTE — Progress Notes (Signed)
Pt is independent and ambulatory in room.  She is on RA, satting at 91%.

## 2017-01-03 DIAGNOSIS — J441 Chronic obstructive pulmonary disease with (acute) exacerbation: Secondary | ICD-10-CM

## 2017-01-03 MED ORDER — ALBUTEROL SULFATE (2.5 MG/3ML) 0.083% IN NEBU: 2.5000 mg | INHALATION_SOLUTION | RESPIRATORY_TRACT | Status: DC | PRN

## 2017-01-03 NOTE — Progress Notes (Signed)
PROGRESS NOTE  [name] [id number] DOB: [birth year] DOA: 01/01/2017 PCP: [name], MD   LOS: 2 days   Brief Narrative: 61 yo F with COPD, active smoker, CAD, HTN, admitted 1/15 with Influenza  Assessment & Plan: Active Problems:   OSA on CPAP   Hypertension   Coronary artery disease   Influenza A   COPD exacerbation (HCC)   Acute renal failure (ARF) (HCC)   Dehydration   Influenza A - continue Tamiflu, Report improvement of dyspnea.   Dizziness;  Report dizziness on standing.  Will check orthostatic vitals.   CAD - no chest pain, stable  COPD exacerbation -  continue Steroid taper, continue DuoNeb's  AKI - due to poor po intake, Cr normalized with fluids  HTN - resume home medications, on diltiazem  Acute hypoxic respiratory failure - wean off oxygen as tolerated   Tobacco abuse - counseled for cessation    DVT prophylaxis: Lovenox Code Status: Full Family Communication: no family at bedside Disposition Plan: home when ready  Consultants:   None   Procedures:   None   Antimicrobials:  Azithromycin 1/15 >>  Tamiflu 1/15 >>  Subjective: -feeling ok, report improvement of dyspnea.  Still having dizziness on standing.    Objective: Vitals:   01/03/17 1306 01/03/17 1307 01/03/17 1309 01/03/17 1312  BP:      Pulse: (!) 58 61 (!) 59 60  Resp:      Temp:      TempSrc:      SpO2:      Weight:      Height:        Intake/Output Summary (Last 24 hours) at 01/03/17 1455 Last data filed at 01/03/17 1313  Gross per 24 hour  Intake             1690 ml  Output              400 ml  Net             1290 ml   Filed Weights   01/01/17 2015 01/02/17 0700 01/03/17 0617  Weight: 63 kg (138 lb 12.8 oz) 61.6 kg (135 lb 12.8 oz) 62.2 kg (137 lb 3.2 oz)    Examination: Constitutional: NAD Vitals:   01/03/17 1306 01/03/17 1307 01/03/17 1309 01/03/17 1312  BP:      Pulse: (!) 58 61 (!) 59 60  Resp:      Temp:      TempSrc:      SpO2:       Weight:      Height:       Eyes: PERRL, lids and conjunctivae normal Respiratory: clear to auscultation bilaterally, no wheezing, no crackles.  Cardiovascular: Regular rate and rhythm, no murmurs / rubs / gallops.  Abdomen: no tenderness. Bowel sounds positive.  Musculoskeletal: no clubbing / cyanosis Neurologic: non focal     Data Reviewed: I have personally reviewed following labs and imaging studies  CBC:  Recent Labs Lab 01/01/17 1630 01/02/17 0300  WBC 7.0 4.0  HGB 15.0 14.1  HCT 43.9 42.7  MCV 88.9 89.1  PLT 236 0000000   Basic Metabolic Panel:  Recent Labs Lab 01/01/17 1630 01/02/17 0300  NA 133* 139  K 3.6 3.8  CL 95* 104  CO2 25 26  GLUCOSE 110* 149*  BUN 21* 14  CREATININE 1.65* 0.97  CALCIUM 9.9 9.1  MG  --  2.1  PHOS  --  3.2   GFR: Estimated  Creatinine Clearance: 51.9 mL/min (by C-G formula based on SCr of 0.97 mg/dL). Liver Function Tests:  Recent Labs Lab 01/01/17 1630 01/02/17 0300  AST 55* 45*  ALT 43 39  ALKPHOS 68 65  BILITOT 0.6 0.4  PROT 6.6 6.4*  ALBUMIN 3.8 3.8    Recent Labs Lab 01/01/17 1630  LIPASE 31   No results for input(s): AMMONIA in the last 168 hours. Coagulation Profile: No results for input(s): INR, PROTIME in the last 168 hours. Cardiac Enzymes: No results for input(s): CKTOTAL, CKMB, CKMBINDEX, TROPONINI in the last 168 hours. BNP (last 3 results) No results for input(s): PROBNP in the last 8760 hours. HbA1C: No results for input(s): HGBA1C in the last 72 hours. CBG: No results for input(s): GLUCAP in the last 168 hours. Lipid Profile: No results for input(s): CHOL, HDL, LDLCALC, TRIG, CHOLHDL, LDLDIRECT in the last 72 hours. Thyroid Function Tests:  Recent Labs  01/02/17 0300  TSH 0.505   Anemia Panel: No results for input(s): VITAMINB12, FOLATE, FERRITIN, TIBC, IRON, RETICCTPCT in the last 72 hours. Urine analysis:    Component Value Date/Time   COLORURINE YELLOW 01/01/2017 2001    APPEARANCEUR CLEAR 01/01/2017 2001   LABSPEC 1.003 (L) 01/01/2017 2001   PHURINE 6.0 01/01/2017 2001   GLUCOSEU NEGATIVE 01/01/2017 2001   HGBUR NEGATIVE 01/01/2017 2001   [place] NEGATIVE 01/01/2017 2001   [place] 01/01/2017 2001   PROTEINUR NEGATIVE 01/01/2017 2001   UROBILINOGEN 0.2 09/17/2008 2342   NITRITE NEGATIVE 01/01/2017 2001   LEUKOCYTESUR NEGATIVE 01/01/2017 2001   Sepsis Labs: Invalid input(s): PROCALCITONIN, LACTICIDVEN  Recent Results (from the past 240 hour(s))  Blood culture (routine x 2)     Status: None (Preliminary result)   Collection Time: 01/01/17  4:50 PM  Result Value Ref Range Status   Specimen Description BLOOD LEFT ANTECUBITAL  Final   Special Requests BOTTLES DRAWN AEROBIC AND ANAEROBIC 5CC  Final   Culture NO GROWTH < 24 HOURS  Final   Report Status PENDING  Incomplete  Blood culture (routine x 2)     Status: None (Preliminary result)   Collection Time: 01/01/17  6:09 PM  Result Value Ref Range Status   Specimen Description BLOOD LEFT HAND  Final   Special Requests IN PEDIATRIC BOTTLE 3CC  Final   Culture NO GROWTH < 24 HOURS  Final   Report Status PENDING  Incomplete      Radiology Studies: Dg Chest 2 View  Result Date: 01/01/2017 CLINICAL DATA:  Cough, wheezing, fever and chills for 5 days, history hypertension, COPD, coronary artery disease, smoker EXAM: CHEST  2 VIEW COMPARISON:  10/14/2016 FINDINGS: Normal heart size, mediastinal contours, and pulmonary vascularity. Chronic peribronchial thickening. Atherosclerotic calcification aortic arch. No acute infiltrate, pleural effusion, or pneumothorax. Prior cervicothoracic fusion. IMPRESSION: Chronic bronchitic changes without infiltrate. Aortic atherosclerosis. Electronically Signed   By: Lavonia Dana M.D.   On: 01/01/2017 17:59   Dg Chest Port 1 View  Result Date: 01/01/2017 CLINICAL DATA:  Sepsis, history of asthma.  Smoker. EXAM: PORTABLE CHEST 1 VIEW COMPARISON:  Chest  radiograph January 01, 2017 FINDINGS: Cardiomediastinal silhouette is normal. Mildly calcified aortic knob. No pleural effusions or focal consolidations. Mild bronchitic changes. Trachea projects midline and there is no pneumothorax. Soft tissue planes and included osseous structures are non-suspicious. Multilevel ACDF. Surgical clips in the included right abdomen compatible with cholecystectomy. IMPRESSION: Mild chronic bronchitic changes without focal consolidation. Electronically Signed   By: Elon Alas M.D.   On:  01/01/2017 20:57     Scheduled Meds: . aspirin EC  81 mg Oral Daily  . azithromycin  500 mg Intravenous Q24H  . buPROPion  150 mg Oral Daily  . clonazePAM  1 mg Oral BID  . diltiazem  120 mg Oral Daily  . DULoxetine  60 mg Oral QHS  . enoxaparin (LOVENOX) injection  40 mg Subcutaneous Q24H  . feeding supplement (ENSURE ENLIVE)  237 mL Oral BID BM  . fenofibrate  54 mg Oral Daily  . guaiFENesin  600 mg Oral BID  . mouth rinse  15 mL Mouth Rinse BID  . nicotine  14 mg Transdermal Q24H  . oseltamivir  75 mg Oral BID  . predniSONE  40 mg Oral Q supper  . sodium chloride flush  3 mL Intravenous Q12H   Continuous Infusions:   Danelly Hassinger, Md.  [facility] Pager [phone]  If 7PM-7AM, please contact night-coverage [email or web address] Password [email or web address] 01/03/2017, 2:55 PM

## 2017-01-03 NOTE — Progress Notes (Signed)
Patient refusing CPAP for tonight. 

## 2017-01-03 NOTE — Progress Notes (Signed)
Pt refuses bed alarm and has been educated to call if she needs help to get out of bed. Will continue to monitor.

## 2017-01-04 MED ORDER — AZITHROMYCIN 500 MG PO TABS: 500.0000 mg | ORAL_TABLET | Freq: Every day | ORAL | Status: DC

## 2017-01-04 MED ORDER — AZITHROMYCIN 500 MG PO TABS: 500.0000 mg | ORAL_TABLET | Freq: Every day | ORAL | 0 refills | Status: DC

## 2017-01-04 MED ORDER — OSELTAMIVIR PHOSPHATE 75 MG PO CAPS: 75.0000 mg | ORAL_CAPSULE | Freq: Two times a day (BID) | ORAL | 0 refills | Status: DC

## 2017-01-04 MED ORDER — PREDNISONE 20 MG PO TABS: 40.0000 mg | ORAL_TABLET | Freq: Every day | ORAL | 0 refills | Status: DC

## 2017-01-04 MED ORDER — ENSURE ENLIVE PO LIQD: 237.0000 mL | Freq: Two times a day (BID) | ORAL | 12 refills | Status: DC

## 2017-01-04 NOTE — Discharge Summary (Signed)
Physician Discharge Summary  [name] [id number] DOB: 04/22/1956 DOA: 01/01/2017  PCP: [name], MD  Admit date: 01/01/2017 Discharge date: 01/04/2017  Admitted From: Home  Disposition:  Home   Recommendations for Outpatient Follow-up:  1. Follow up with PCP in 1-2 weeks 2. Please obtain BMP/CBC in one week    Discharge Condition: Stable.  CODE STATUS: Full code.  Diet recommendation: Heart Healthy   Brief/Interim Summary: 61 yo F with COPD, active smoker, CAD, HTN, admitted 1/15 with Influenza  Assessment & Plan: Influenza A - continue Tamiflu.  Report improvement of dyspnea.  Patient will be discharge on 3 more days of tamiflu.   Dizziness;  Report dizziness on standing.  Will check orthostatic vitals.   CAD - no chest pain, stable  COPD exacerbation -  continue Steroid taper, continue DuoNeb's  AKI - due to poor po intake, Cr normalized with fluids  HTN - resume home medications, on diltiazem  Acute hypoxic respiratory failure - wean off oxygen as tolerated  resolved.   Tobacco abuse - counseled for cessation    Discharge Diagnoses:  Active Problems:   OSA on CPAP   Hypertension   Coronary artery disease   Influenza A   COPD exacerbation (HCC)   Acute renal failure (ARF) (HCC)   Dehydration    Discharge Instructions  Discharge Instructions    Diet - low sodium heart healthy    Complete by:  As directed    Increase activity slowly    Complete by:  As directed      Allergies as of 01/04/2017      Reactions   Midazolam Hcl Other (See Comments)   HYPER   Prednisone Other (See Comments)   HYPER, depressed, moody   Statins Other (See Comments)   Causes muscle weakness and joint pain   Ace Inhibitors Other (See Comments)   DECREASES HEARTRATE   Amoxicillin Diarrhea   Nsaids Other (See Comments)   AVOIDS HX GASTRIC ULCER   Sulfa Antibiotics Rash, Other (See Comments)   JOINT PAIN      Medication List    STOP taking these  medications   isosorbide mononitrate 60 MG 24 hr tablet Commonly known as:  IMDUR   loperamide 2 MG capsule Commonly known as:  IMODIUM   metoprolol tartrate 25 MG tablet Commonly known as:  LOPRESSOR     TAKE these medications   acetaminophen 650 MG CR tablet Commonly known as:  TYLENOL Take 1,300 mg by mouth 2 (two) times daily as needed for pain.   albuterol 108 (90 Base) MCG/ACT inhaler Commonly known as:  PROVENTIL HFA;VENTOLIN HFA Inhale 2 puffs into the lungs every 6 (six) hours as needed for wheezing or shortness of breath.   aspirin 81 MG EC tablet Take 1 tablet (81 mg total) by mouth daily.   azithromycin 500 MG tablet Commonly known as:  ZITHROMAX Take 1 tablet (500 mg total) by mouth daily at 6 PM.   buPROPion 150 MG 24 hr tablet Commonly known as:  WELLBUTRIN XL Take 150 mg by mouth daily.   cholecalciferol 1000 units tablet Commonly known as:  VITAMIN D Take 1,000 Units by mouth daily.   clonazePAM 1 MG tablet Commonly known as:  KLONOPIN Take 1 mg by mouth 2 (two) times daily.   diltiazem 120 MG 24 hr capsule Commonly known as:  CARDIZEM CD Take 1 capsule (120 mg total) by mouth daily.   DULoxetine 60 MG capsule Commonly known as:  CYMBALTA Take 60 mg by mouth at bedtime.   feeding supplement (ENSURE ENLIVE) Liqd Take 237 mLs by mouth 2 (two) times daily between meals.   fenofibrate 48 MG tablet Commonly known as:  TRICOR Take 48 mg by mouth daily.   ferrous sulfate 325 (65 FE) MG tablet Take 325 mg by mouth daily with breakfast.   guaiFENesin 600 MG 12 hr tablet Commonly known as:  MUCINEX Take 1 tablet (600 mg total) by mouth 2 (two) times daily. What changed:  when to take this  reasons to take this   HYDROcodone-acetaminophen 5-325 MG tablet Commonly known as:  NORCO/VICODIN Take 1 tablet by mouth 2 (two) times daily as needed for pain.   Magnesium 500 MG Caps Take 500 mg by mouth daily.   nicotine 21 mg/24hr patch Commonly  known as:  NICODERM CQ - dosed in mg/24 hours Place 1 patch (21 mg total) onto the skin daily.   nitroGLYCERIN 0.4 MG SL tablet Commonly known as:  NITROSTAT Place 1 tablet (0.4 mg total) under the tongue every 5 (five) minutes x 3 doses as needed for chest pain.   ondansetron 4 MG tablet Commonly known as:  ZOFRAN Take 1 tablet (4 mg total) by mouth every 6 (six) hours as needed for nausea or vomiting.   oseltamivir 75 MG capsule Commonly known as:  TAMIFLU Take 1 capsule (75 mg total) by mouth 2 (two) times daily.   pantoprazole 40 MG tablet Commonly known as:  PROTONIX Take 1 tablet (40 mg total) by mouth daily as needed. What changed:  reasons to take this   predniSONE 20 MG tablet Commonly known as:  DELTASONE Take 2 tablets (40 mg total) by mouth daily with supper.   SUPER B COMPLEX/C PO Take 1 tablet by mouth daily.   vitamin C 1000 MG tablet Take 1,000 mg by mouth daily.       Allergies  Allergen Reactions  . Midazolam Hcl Other (See Comments)    HYPER  . Prednisone Other (See Comments)    HYPER, depressed, moody  . Statins Other (See Comments)    Causes muscle weakness and joint pain  . Ace Inhibitors Other (See Comments)    DECREASES HEARTRATE  . Amoxicillin Diarrhea  . Nsaids Other (See Comments)    AVOIDS HX GASTRIC ULCER  . Sulfa Antibiotics Rash and Other (See Comments)    JOINT PAIN    Consultations: none  Procedures/Studies:  none    Subjective: Feeling better. Dizziness improved. Denies dyspnea.    Discharge Exam: Vitals:   01/03/17 2118 01/04/17 0559  BP: 132/62 (!) 149/77  Pulse: 65 80  Resp: 19 19  Temp: 98.5 F (36.9 C) 98 F (36.7 C)   Vitals:   01/03/17 1309 01/03/17 1312 01/03/17 2118 01/04/17 0559  BP:   132/62 (!) 149/77  Pulse: (!) 59 60 65 80  Resp:   19 19  Temp:   98.5 F (36.9 C) 98 F (36.7 C)  TempSrc:   Oral Oral  SpO2:   92% 93%  Weight:    61.6 kg (135 lb 11.2 oz)  Height:        General: Pt is  alert, awake, not in acute distress Cardiovascular: RRR, S1/S2 +, no rubs, no gallops Respiratory: CTA bilaterally, no wheezing, no rhonchi Abdominal: Soft, NT, ND, bowel sounds + Extremities: no edema, no cyanosis    The results of significant diagnostics from this hospitalization (including imaging, microbiology, ancillary and laboratory) are listed  below for reference.     Microbiology: Recent Results (from the past 240 hour(s))  Blood culture (routine x 2)     Status: None (Preliminary result)   Collection Time: 01/01/17  4:50 PM  Result Value Ref Range Status   Specimen Description BLOOD LEFT ANTECUBITAL  Final   Special Requests BOTTLES DRAWN AEROBIC AND ANAEROBIC 5CC  Final   Culture NO GROWTH 2 DAYS  Final   Report Status PENDING  Incomplete  Blood culture (routine x 2)     Status: None (Preliminary result)   Collection Time: 01/01/17  6:09 PM  Result Value Ref Range Status   Specimen Description BLOOD LEFT HAND  Final   Special Requests IN PEDIATRIC BOTTLE 3CC  Final   Culture NO GROWTH 2 DAYS  Final   Report Status PENDING  Incomplete     Labs: BNP (last 3 results) No results for input(s): BNP in the last 8760 hours. Basic Metabolic Panel:  Recent Labs Lab 01/01/17 1630 01/02/17 0300  NA 133* 139  K 3.6 3.8  CL 95* 104  CO2 25 26  GLUCOSE 110* 149*  BUN 21* 14  CREATININE 1.65* 0.97  CALCIUM 9.9 9.1  MG  --  2.1  PHOS  --  3.2   Liver Function Tests:  Recent Labs Lab 01/01/17 1630 01/02/17 0300  AST 55* 45*  ALT 43 39  ALKPHOS 68 65  BILITOT 0.6 0.4  PROT 6.6 6.4*  ALBUMIN 3.8 3.8    Recent Labs Lab 01/01/17 1630  LIPASE 31   No results for input(s): AMMONIA in the last 168 hours. CBC:  Recent Labs Lab 01/01/17 1630 01/02/17 0300  WBC 7.0 4.0  HGB 15.0 14.1  HCT 43.9 42.7  MCV 88.9 89.1  PLT 236 176   Cardiac Enzymes: No results for input(s): CKTOTAL, CKMB, CKMBINDEX, TROPONINI in the last 168 hours. BNP: Invalid input(s):  POCBNP CBG: No results for input(s): GLUCAP in the last 168 hours. D-Dimer No results for input(s): DDIMER in the last 72 hours. Hgb A1c No results for input(s): HGBA1C in the last 72 hours. Lipid Profile No results for input(s): CHOL, HDL, LDLCALC, TRIG, CHOLHDL, LDLDIRECT in the last 72 hours. Thyroid function studies  Recent Labs  01/02/17 0300  TSH 0.505   Anemia work up No results for input(s): VITAMINB12, FOLATE, FERRITIN, TIBC, IRON, RETICCTPCT in the last 72 hours. Urinalysis    Component Value Date/Time   COLORURINE YELLOW 01/01/2017 2001   APPEARANCEUR CLEAR 01/01/2017 2001   LABSPEC 1.003 (L) 01/01/2017 2001   PHURINE 6.0 01/01/2017 2001   GLUCOSEU NEGATIVE 01/01/2017 2001   HGBUR NEGATIVE 01/01/2017 2001   BILIRUBINUR NEGATIVE 01/01/2017 2001   KETONESUR NEGATIVE 01/01/2017 2001   PROTEINUR NEGATIVE 01/01/2017 2001   UROBILINOGEN 0.2 09/17/2008 2342   NITRITE NEGATIVE 01/01/2017 2001   LEUKOCYTESUR NEGATIVE 01/01/2017 2001   Sepsis Labs Invalid input(s): PROCALCITONIN,  WBC,  LACTICIDVEN Microbiology Recent Results (from the past 240 hour(s))  Blood culture (routine x 2)     Status: None (Preliminary result)   Collection Time: 01/01/17  4:50 PM  Result Value Ref Range Status   Specimen Description BLOOD LEFT ANTECUBITAL  Final   Special Requests BOTTLES DRAWN AEROBIC AND ANAEROBIC 5CC  Final   Culture NO GROWTH 2 DAYS  Final   Report Status PENDING  Incomplete  Blood culture (routine x 2)     Status: None (Preliminary result)   Collection Time: 01/01/17  6:09 PM  Result Value  Ref Range Status   Specimen Description BLOOD LEFT HAND  Final   Special Requests IN PEDIATRIC BOTTLE 3CC  Final   Culture NO GROWTH 2 DAYS  Final   Report Status PENDING  Incomplete     Time coordinating discharge: Over 30 minutes  SIGNED:   Elmarie Shiley, MD  [facility] 01/04/2017, 1:09 PM Pager   If 7PM-7AM, please contact  night-coverage [email or web address] Password [email or web address]

## 2017-01-04 NOTE — Progress Notes (Signed)
Physical Therapy Treatment Patient Details Name: [name] MRN: [id number] DOB: [birth year] Today's Date: 01/04/2017    History of Present Illness 61 yo female with onset of influenza for the last 4 days, cough and SOB. Does not use cpap and is a smoker.  Recent palpitations and declining HR.  PMHx: COPD, CAD, HTN, OSA,     PT Comments    Pt is demonstrating fluctuations with O2 sats from 90% to 97% with short walks, still coughing.  Her plan is to get therapy after her admission, which per MD might be vestibular in nature but is only having the issue after being up walking awhile.  Atypical presentation for vestibular condition but have upgraded to HHPT to follow up with therapy.  Pt can progress to outpatient after that for her therapy as needed.    Follow Up Recommendations  Home health PT     Equipment Recommendations       Recommendations for Other Services       Precautions / Restrictions Precautions Precautions: Fall Restrictions Weight Bearing Restrictions: No    Mobility  Bed Mobility Overal bed mobility: Modified Independent                Transfers Overall transfer level: Modified independent Equipment used: None                Ambulation/Gait Ambulation/Gait assistance: Min guard Ambulation Distance (Feet): 70 Feet (in two walks) Assistive device: 1 person hand held assist Gait Pattern/deviations: WFL(Within Functional Limits) Gait velocity: normal Gait velocity interpretation: at or above normal speed for age/gender General Gait Details: pt continues to complain of light headedness and has fluctuating O2 sats, reported to nursing   Stairs            Wheelchair Mobility    Modified Rankin (Stroke Patients Only)       Balance Overall balance assessment: Needs assistance Sitting-balance support: Feet supported Sitting balance-Leahy Scale: Good       Standing balance-Leahy Scale: Fair                       Cognition Arousal/Alertness: Awake/alert Behavior During Therapy: WFL for tasks assessed/performed Overall Cognitive Status: Within Functional Limits for tasks assessed                      Exercises General Exercises - Lower Extremity Ankle Circles/Pumps: AROM;Both;5 reps Heel Slides: Strengthening;Both;15 reps Hip ABduction/ADduction: Strengthening;Both;15 reps    General Comments General comments (skin integrity, edema, etc.): continues light headed feeling today, slight shift in posture to turn around during walking but no LOB      Pertinent Vitals/Pain Pain Assessment: No/denies pain    Home Living                      Prior Function            PT Goals (current goals can now be found in the care plan section) Progress towards PT goals: Progressing toward goals    Frequency    Min 2X/week      PT Plan Discharge plan needs to be updated    Co-evaluation             End of Session Equipment Utilized During Treatment: Gait belt Activity Tolerance: Other (comment) (lighheaded) Patient left: in bed;with call bell/phone within reach     Time: 1203-1234 PT Time Calculation (min) (ACUTE ONLY): 31 min  Charges:  $  Gait Training: 8-22 mins $Therapeutic Exercise: 8-22 mins                    G Codes:      [name] 01/05/2017, 1:52 PM  [name], PT MS Acute Rehab Dept. Number: [place] [facility] [place]

## 2017-01-04 NOTE — Progress Notes (Signed)
Discharge instructions given. Pt verbalized understanding and all questions were answered.  

## 2017-01-04 NOTE — Progress Notes (Signed)
Pt refuses bed alarm and has been educated to call if she needs help to get out of bed. Will continue to monitor.

## 2017-01-06 LAB — CULTURE, BLOOD (ROUTINE X 2)
CULTURE: NO GROWTH
Culture: NO GROWTH

## 2017-01-17 DIAGNOSIS — R399 Unspecified symptoms and signs involving the genitourinary system: Secondary | ICD-10-CM | POA: Diagnosis not present

## 2017-01-17 DIAGNOSIS — I10 Essential (primary) hypertension: Secondary | ICD-10-CM | POA: Diagnosis not present

## 2017-01-17 DIAGNOSIS — Z09 Encounter for follow-up examination after completed treatment for conditions other than malignant neoplasm: Secondary | ICD-10-CM | POA: Diagnosis not present

## 2017-01-17 DIAGNOSIS — F17201 Nicotine dependence, unspecified, in remission: Secondary | ICD-10-CM | POA: Diagnosis not present

## 2017-01-17 DIAGNOSIS — R0602 Shortness of breath: Secondary | ICD-10-CM | POA: Diagnosis not present

## 2017-02-15 DIAGNOSIS — I10 Essential (primary) hypertension: Secondary | ICD-10-CM | POA: Diagnosis not present

## 2017-02-15 DIAGNOSIS — M545 Low back pain: Secondary | ICD-10-CM | POA: Diagnosis not present

## 2017-02-15 DIAGNOSIS — I2510 Atherosclerotic heart disease of native coronary artery without angina pectoris: Secondary | ICD-10-CM | POA: Diagnosis not present

## 2017-02-15 DIAGNOSIS — R42 Dizziness and giddiness: Secondary | ICD-10-CM | POA: Diagnosis not present

## 2017-02-15 DIAGNOSIS — F17210 Nicotine dependence, cigarettes, uncomplicated: Secondary | ICD-10-CM | POA: Diagnosis not present

## 2017-02-24 ENCOUNTER — Encounter (HOSPITAL_COMMUNITY): Payer: Self-pay | Admitting: Emergency Medicine

## 2017-02-24 ENCOUNTER — Emergency Department (HOSPITAL_COMMUNITY)
Admission: EM | Admit: 2017-02-24 | Discharge: 2017-02-24 | Disposition: A | Discharge status: Home / Self Care | Payer: Medicare Other | Attending: Emergency Medicine | Admitting: Emergency Medicine

## 2017-02-24 DIAGNOSIS — I2510 Atherosclerotic heart disease of native coronary artery without angina pectoris: Secondary | ICD-10-CM | POA: Diagnosis not present

## 2017-02-24 DIAGNOSIS — J449 Chronic obstructive pulmonary disease, unspecified: Secondary | ICD-10-CM | POA: Diagnosis not present

## 2017-02-24 DIAGNOSIS — Z7982 Long term (current) use of aspirin: Secondary | ICD-10-CM | POA: Insufficient documentation

## 2017-02-24 DIAGNOSIS — W228XXA Striking against or struck by other objects, initial encounter: Secondary | ICD-10-CM | POA: Diagnosis not present

## 2017-02-24 DIAGNOSIS — Z955 Presence of coronary angioplasty implant and graft: Secondary | ICD-10-CM | POA: Diagnosis not present

## 2017-02-24 DIAGNOSIS — Z79899 Other long term (current) drug therapy: Secondary | ICD-10-CM | POA: Insufficient documentation

## 2017-02-24 DIAGNOSIS — I10 Essential (primary) hypertension: Secondary | ICD-10-CM | POA: Insufficient documentation

## 2017-02-24 DIAGNOSIS — M546 Pain in thoracic spine: Secondary | ICD-10-CM | POA: Insufficient documentation

## 2017-02-24 DIAGNOSIS — Y929 Unspecified place or not applicable: Secondary | ICD-10-CM | POA: Insufficient documentation

## 2017-02-24 DIAGNOSIS — M545 Low back pain: Secondary | ICD-10-CM | POA: Diagnosis present

## 2017-02-24 DIAGNOSIS — Y999 Unspecified external cause status: Secondary | ICD-10-CM | POA: Insufficient documentation

## 2017-02-24 DIAGNOSIS — F17200 Nicotine dependence, unspecified, uncomplicated: Secondary | ICD-10-CM | POA: Diagnosis not present

## 2017-02-24 DIAGNOSIS — Y939 Activity, unspecified: Secondary | ICD-10-CM | POA: Diagnosis not present

## 2017-02-24 LAB — URINALYSIS, ROUTINE W REFLEX MICROSCOPIC
Bacteria, UA: NONE SEEN
Bilirubin Urine: NEGATIVE
Glucose, UA: NEGATIVE mg/dL
Hgb urine dipstick: NEGATIVE
Ketones, ur: 5 mg/dL — AB
Nitrite: NEGATIVE
Protein, ur: NEGATIVE mg/dL
Specific Gravity, Urine: 1.02 (ref 1.005–1.030)
pH: 5 (ref 5.0–8.0)

## 2017-02-24 MED ORDER — HYDROMORPHONE HCL 1 MG/ML IJ SOLN
0.5000 mg | Freq: Once | INTRAMUSCULAR | Status: AC
  Administered 2017-02-24: 0.5 mg via INTRAMUSCULAR
  Filled 2017-02-24: qty 0.5

## 2017-02-24 MED ORDER — DEXAMETHASONE SODIUM PHOSPHATE 10 MG/ML IJ SOLN
10.0000 mg | Freq: Once | INTRAMUSCULAR | Status: AC
  Administered 2017-02-24: 10 mg via INTRAMUSCULAR
  Filled 2017-02-24: qty 1

## 2017-02-24 MED ORDER — DIAZEPAM 5 MG PO TABS: 2.5000 mg | ORAL_TABLET | Freq: Three times a day (TID) | ORAL | 0 refills | Status: DC | PRN

## 2017-02-24 NOTE — ED Provider Notes (Signed)
[facility] DEPT Provider Note   CSN: [id number] Arrival date & time: 02/24/17  1831  By signing my name below, I, Dora Sims, attest that this documentation has been prepared under the direction and in the presence of physician practitioner, Virgel Manifold, MD. Electronically Signed: Dora Sims, Scribe. 02/24/2017. 7:50 PM.  History   Chief Complaint Chief Complaint  Patient presents with  . Flank Pain    The history is provided by the patient. No language interpreter was used.     HPI Comments: [name] is a 61 y.o. female with PMHx including DDD, DJD, and fibromyalgia who presents to the Emergency Department complaining of sudden onset, constant, left lower back pain beginning a few days ago. She states she struck her left lower back on a "latch" and her pain has been constant since. She notes her pain radiates into her left flank and abdomen. Pt endorses pain exacerbation with generalized movement and certain positions. She notes her pain is not exacerbated by deep inhalation. Pt has tried hydrocodone and acetaminophen with mild improvement of her back pain; she is prescribed hydrocodone for pre-existing back pain and notes her current pain is different. Pt denies difficulty urinating, numbness/tingling, or any other associated symptoms.  Past Medical History:  Diagnosis Date  . Agoraphobia without history of panic disorder   . Anxiety   . Asthma   . Coronary artery disease CARDIOLOGIST- DR  Doylene Canard  (VISIT 03-30-11 W/ CHART)   NON-OBS. CAD   (STRESS TEST NOV. 2011  . DDD (degenerative disc disease)   . Depression   . DJD (degenerative joint disease)    JOINT PAIN  . Fibromyalgia   . Frequency of urination   . GERD (gastroesophageal reflux disease) AND HIATIAL HERNIA   CONTROLLED W/ NEXIUM  . Hemorrhoids   . History of gastric ulcer 2004  . Hypertension   . IBS (irritable bowel syndrome)   . Incisional pain s/p interstim implant 1st stage--- 12-07-11    left  upper buttock-- pt states dressing clean dry and intact (on 12-08-11)  . Insomnia   . Iron deficiency anemia   . Nocturia   . Non-productive cough   . Numbness in both hands AT TIMES  . OSA on CPAP   . Urge urinary incontinence     Patient Active Problem List   Diagnosis Date Noted  . Influenza A 01/01/2017  . COPD exacerbation ([place]) 01/01/2017  . Acute renal failure (ARF) ([place]) 01/01/2017  . Dehydration 01/01/2017  . Unstable angina ([place]) 10/14/2016  . Chest pain 08/22/2016  . SVT (supraventricular tachycardia) ([place]) 08/22/2016  . OSA on CPAP   . Hypertension   . GERD (gastroesophageal reflux disease)   . Depression   . Coronary artery disease   . Anxiety   . Pain in the chest   . Hypokalemia   . Carpal tunnel syndrome, bilateral 02/18/2016  . Leukocytosis 05/26/2014  . Tobacco abuse 05/26/2014  . Urge incontinence 12/14/2011    Past Surgical History:  Procedure Laterality Date  . CARDIAC CATHETERIZATION  2003  . CARDIAC CATHETERIZATION N/A 08/23/2016   Procedure: Left Heart Cath and Coronary Angiography;  Surgeon: Dixie Dials, MD;  Location: [facility];  Service: Cardiovascular;  Laterality: N/A;  . CERVICAL DISCECTOMY  2002   C4 - 7  . CERVICAL FUSION  2008   C4 - T1  . CHOLECYSTECTOMY  1982  . CYSTO/ HOD/ BLADDER BX  2006  . CYSTO/ HOD/ BLADDER BX/ FULGERATION  06-12-2011  .  HYSTEROSCOPY W/D&C  2005  . INTERSTIM IMPLANT PLACEMENT  12/07/2011   Procedure: Barrie Lyme IMPLANT FIRST STAGE;  Surgeon: [name], MD;  Location: [facility];  Service: Urology;  Laterality: Right;  . INTERSTIM IMPLANT PLACEMENT  12/14/2011   Procedure: Barrie Lyme IMPLANT SECOND STAGE;  Surgeon: [name], MD;  Location: [facility];  Service: Urology;  Laterality: Right;  rad tech ok by vickie at [facility]  . LAMINECTOMY  2007   L3 - 5  . RIGHT THUMB SURG.  2001  . TONSILLECTOMY AND ADENOIDECTOMY  1965  . WRIST SURGERY Left    [facility]     OB History    No data available       Home Medications    Prior to Admission medications   Medication Sig Start Date End Date Taking? Authorizing Provider  acetaminophen (TYLENOL) 650 MG CR tablet Take 1,300 mg by mouth 2 (two) times daily as needed for pain.    Historical Provider, MD  albuterol (PROVENTIL HFA;VENTOLIN HFA) 108 (90 BASE) MCG/ACT inhaler Inhale 2 puffs into the lungs every 6 (six) hours as needed for wheezing or shortness of breath.    Historical Provider, MD  Ascorbic Acid (VITAMIN C) 1000 MG tablet Take 1,000 mg by mouth daily.    Historical Provider, MD  aspirin EC 81 MG EC tablet Take 1 tablet (81 mg total) by mouth daily. 10/18/16   Dixie Dials, MD  azithromycin (ZITHROMAX) 500 MG tablet Take 1 tablet (500 mg total) by mouth daily at 6 PM. 01/04/17   Belkys A Regalado, MD  buPROPion (WELLBUTRIN XL) 150 MG 24 hr tablet Take 150 mg by mouth daily.     Historical Provider, MD  cholecalciferol (VITAMIN D) 1000 UNITS tablet Take 1,000 Units by mouth daily.     Historical Provider, MD  clonazePAM (KLONOPIN) 1 MG tablet Take 1 mg by mouth 2 (two) times daily. 05/10/15   Historical Provider, MD  diltiazem (CARDIZEM CD) 120 MG 24 hr capsule Take 1 capsule (120 mg total) by mouth daily. 10/18/16   Dixie Dials, MD  DULoxetine (CYMBALTA) 60 MG capsule Take 60 mg by mouth at bedtime.     Historical Provider, MD  feeding supplement, ENSURE ENLIVE, (ENSURE ENLIVE) LIQD Take 237 mLs by mouth 2 (two) times daily between meals. 01/04/17   Belkys A Regalado, MD  fenofibrate (TRICOR) 48 MG tablet Take 48 mg by mouth daily.  04/30/15   Historical Provider, MD  ferrous sulfate 325 (65 FE) MG tablet Take 325 mg by mouth daily with breakfast.    Historical Provider, MD  guaiFENesin (MUCINEX) 600 MG 12 hr tablet Take 1 tablet (600 mg total) by mouth 2 (two) times daily. Patient taking differently: Take 600 mg by mouth 2 (two) times daily as needed for cough or to loosen phlegm.  10/17/16    Dixie Dials, MD  HYDROcodone-acetaminophen (NORCO) 5-325 MG per tablet Take 1 tablet by mouth 2 (two) times daily as needed for pain.     Historical Provider, MD  Magnesium 500 MG CAPS Take 500 mg by mouth daily.     Historical Provider, MD  nicotine (NICODERM CQ - DOSED IN MG/24 HOURS) 21 mg/24hr patch Place 1 patch (21 mg total) onto the skin daily. Patient not taking: Reported on 01/01/2017 08/24/16   Eugenie Filler, MD  nitroGLYCERIN (NITROSTAT) 0.4 MG SL tablet Place 1 tablet (0.4 mg total) under the tongue every 5 (five) minutes x 3 doses as  needed for chest pain. 08/24/16   Eugenie Filler, MD  ondansetron (ZOFRAN) 4 MG tablet Take 1 tablet (4 mg total) by mouth every 6 (six) hours as needed for nausea or vomiting. 08/27/16   Forde Dandy, MD  oseltamivir (TAMIFLU) 75 MG capsule Take 1 capsule (75 mg total) by mouth 2 (two) times daily. 01/04/17   Belkys A Regalado, MD  pantoprazole (PROTONIX) 40 MG tablet Take 1 tablet (40 mg total) by mouth daily as needed. Patient taking differently: Take 40 mg by mouth daily as needed (for acid reflux).  08/24/16   Eugenie Filler, MD  predniSONE (DELTASONE) 20 MG tablet Take 2 tablets (40 mg total) by mouth daily with supper. 01/04/17   Belkys A Regalado, MD  SUPER B COMPLEX/C PO Take 1 tablet by mouth daily.     Historical Provider, MD    Family History Family History  Problem Relation Age of Onset  . Hypertension Father   . Heart disease Father   . Kidney failure Father   . Breast cancer Mother   . Hypertension Mother   . Coronary artery disease Mother   . Alzheimer's disease Mother   . Hypertension Brother   . Hypertension Sister   . Hypertension Daughter     Social History Social History  Substance Use Topics  . Smoking status: Current Every Day Smoker    Packs/day: 2.00    Years: 30.00  . Smokeless tobacco: Never Used  . Alcohol use No     Comment: OCCASIONAL     Allergies   Midazolam hcl; Prednisone; Statins; Ace inhibitors;  Amoxicillin; Nsaids; and Sulfa antibiotics   Review of Systems Review of Systems  Gastrointestinal: Positive for abdominal pain.  Genitourinary: Positive for flank pain (L). Negative for difficulty urinating.  Musculoskeletal: Positive for back pain.  Neurological: Negative for numbness.  All other systems reviewed and are negative.   Physical Exam Updated Vital Signs BP 126/70 (BP Location: Left Arm)   Pulse 72   Temp 97.8 F (36.6 C) (Oral)   Resp 18   Ht 4\' 11"  (1.499 m)   Wt 146 lb (66.2 kg)   LMP 09/06/2005   SpO2 98%   BMI 29.49 kg/m   Physical Exam  Constitutional: She appears well-developed and well-nourished.  HENT:  Head: Normocephalic.  Right Ear: External ear normal.  Left Ear: External ear normal.  Nose: Nose normal.  Mouth/Throat: Oropharynx is clear and moist.  Eyes: Conjunctivae are normal. Right eye exhibits no discharge. Left eye exhibits no discharge.  Neck: Normal range of motion.  Cardiovascular: Normal rate, regular rhythm and normal heart sounds.   No murmur heard. Pulmonary/Chest: Effort normal and breath sounds normal. No respiratory distress. She has no wheezes. She has no rales.  Abdominal: Soft. She exhibits no distension. There is no tenderness. There is no rebound and no guarding.  Surgical scar noted in left lower quadrant.  Musculoskeletal: Normal range of motion. She exhibits tenderness. She exhibits no edema.  No midline spinal tenderness. TTP in erythematous area of left back.  Neurological: She is alert. No cranial nerve deficit. Coordination normal.  5/5 strength bilateral lower extremities. Sensation intact to light touch.   Skin: Skin is warm and dry. No rash noted. There is erythema. No pallor.  Faint erythema left back, mid-to-lower thoracic region. Patient states that she's been pressing on this area. No vesicles or other concerning lesions. Erythema does not extend into flank or abdomen.  Psychiatric: She has a  normal mood and  affect. Her behavior is normal.  Nursing note and vitals reviewed.   ED Treatments / Results  Labs (all labs ordered are listed, but only abnormal results are displayed) Labs Reviewed  URINALYSIS, ROUTINE W REFLEX MICROSCOPIC - Abnormal; Notable for the following:       Result Value   Color, Urine AMBER (*)    APPearance CLOUDY (*)    Ketones, ur 5 (*)    Leukocytes, UA TRACE (*)    Squamous Epithelial / LPF 0-5 (*)    All other components within normal limits    EKG  EKG Interpretation None       Radiology No results found.  Procedures Procedures (including critical care time)  DIAGNOSTIC STUDIES: Oxygen Saturation is 98% on RA, normal by my interpretation.    COORDINATION OF CARE: 7:58 PM Discussed treatment plan with pt at bedside and pt agreed to plan.  Medications Ordered in ED Medications - No data to display   Initial Impression / Assessment and Plan / ED Course  I have reviewed the triage vital signs and the nursing notes.  Pertinent labs & imaging results that were available during my care of the patient were reviewed by me and considered in my medical decision making (see chart for details).     Final Clinical Impressions(s) / ED Diagnoses   Final diagnoses:  Acute left-sided thoracic back pain    New Prescriptions New Prescriptions   No medications on file   I personally preformed the services scribed in my presence. The recorded information has been reviewed is accurate. Virgel Manifold, MD.    Virgel Manifold, MD 03/06/17 [id number]

## 2017-02-24 NOTE — ED Triage Notes (Signed)
Patient reports left flank pain with dysuria x1 week. Denies abdominal pain, N/V/D and chest pain. Ambulatory to triage.

## 2017-03-29 ENCOUNTER — Other Ambulatory Visit: Payer: Self-pay | Admitting: Cardiology

## 2017-03-29 NOTE — Telephone Encounter (Signed)
It was not d/c'd on 10/17/16, dosage was changed at hospital d/c. Please refer refills to [name] - her primary cardiologist. thanks

## 2017-03-29 NOTE — Telephone Encounter (Signed)
Should the patient still be taking this medication? It is not listed on her current med list and on the refill request it is indicated that the rx was d/c'd on 10/17/16 by Dixie Dials, MD. Please advise. Thanks, MI

## 2017-04-05 ENCOUNTER — Ambulatory Visit (INDEPENDENT_AMBULATORY_CARE_PROVIDER_SITE_OTHER): Payer: Medicare Other | Admitting: Cardiology

## 2017-04-05 ENCOUNTER — Encounter: Payer: Self-pay | Admitting: Cardiology

## 2017-04-05 VITALS — BP 160/84 | HR 72 | Ht 59.0 in | Wt 155.0 lb

## 2017-04-05 DIAGNOSIS — I2510 Atherosclerotic heart disease of native coronary artery without angina pectoris: Secondary | ICD-10-CM | POA: Diagnosis not present

## 2017-04-05 DIAGNOSIS — I471 Supraventricular tachycardia: Secondary | ICD-10-CM | POA: Diagnosis not present

## 2017-04-05 NOTE — Progress Notes (Signed)
Cardiology Office Note   Date:  04/05/2017   ID:  [name], DOB 1956-06-14, MRN [id number]  PCP:  [name], MD  Cardiologist:  Constance Haw, MD    Chief Complaint  Patient presents with  . Follow-up    Palpitation/SVT     History of Present Illness: [name] is a 61 y.o. female who presents today for cardiology evaluation.   She has history of coronary artery disease, hypertension, hyperlipidemia. She says that she had a heart catheterization 2003 that showed minimal coronary artery disease. Since then she has had stress tests and echocardiograms yearly. Admitted to the hospital with chest pain and SVT.  She was started on 12.5 mg metoprolol for her SVT. Was also noted to have AF post adenosine but was short lived. Heart catheterization was done which showed thickened coronary artery disease as below. No intervention was performed.   Today, she denies symptoms of chest pain, shortness of breath, PND, orthopnea.  Today, she says that she is trying to keep a heart healthy diet and she has since stopped smoking. Quit smoking in January 2018. She does complain of some fatigue, but otherwise feels well.  Past Medical History:  Diagnosis Date  . Agoraphobia without history of panic disorder   . Anxiety   . Asthma   . Coronary artery disease CARDIOLOGIST- DR  Doylene Canard  (VISIT 03-30-11 W/ CHART)   NON-OBS. CAD   (STRESS TEST NOV. 2011  . DDD (degenerative disc disease)   . Depression   . DJD (degenerative joint disease)    JOINT PAIN  . Fibromyalgia   . Frequency of urination   . GERD (gastroesophageal reflux disease) AND HIATIAL HERNIA   CONTROLLED W/ NEXIUM  . Hemorrhoids   . History of gastric ulcer 2004  . Hypertension   . IBS (irritable bowel syndrome)   . Incisional pain s/p interstim implant 1st stage--- 12-07-11    left upper buttock-- pt states dressing clean dry and intact (on 12-08-11)  . Insomnia   . Iron deficiency anemia   . Nocturia   .  Non-productive cough   . Numbness in both hands AT TIMES  . OSA on CPAP   . Urge urinary incontinence    Past Surgical History:  Procedure Laterality Date  . CARDIAC CATHETERIZATION  2003  . CARDIAC CATHETERIZATION N/A 08/23/2016   Procedure: Left Heart Cath and Coronary Angiography;  Surgeon: Dixie Dials, MD;  Location: [facility];  Service: Cardiovascular;  Laterality: N/A;  . CERVICAL DISCECTOMY  2002   C4 - 7  . CERVICAL FUSION  2008   C4 - T1  . CHOLECYSTECTOMY  1982  . CYSTO/ HOD/ BLADDER BX  2006  . CYSTO/ HOD/ BLADDER BX/ FULGERATION  06-12-2011  . HYSTEROSCOPY W/D&C  2005  . INTERSTIM IMPLANT PLACEMENT  12/07/2011   Procedure: Barrie Lyme IMPLANT FIRST STAGE;  Surgeon: [name], MD;  Location: [facility];  Service: Urology;  Laterality: Right;  . INTERSTIM IMPLANT PLACEMENT  12/14/2011   Procedure: Barrie Lyme IMPLANT SECOND STAGE;  Surgeon: [name], MD;  Location: [facility];  Service: Urology;  Laterality: Right;  rad tech ok by vickie at [facility]  . LAMINECTOMY  2007   L3 - 5  . RIGHT THUMB SURG.  2001  . TONSILLECTOMY AND ADENOIDECTOMY  1965  . WRIST SURGERY Left    [facility]     Current Outpatient Prescriptions  Medication Sig Dispense Refill  .  acetaminophen (TYLENOL) 650 MG CR tablet Take 1,300 mg by mouth 2 (two) times daily as needed for pain.    Marland Kitchen albuterol (PROVENTIL HFA;VENTOLIN HFA) 108 (90 BASE) MCG/ACT inhaler Inhale 2 puffs into the lungs every 6 (six) hours as needed for wheezing or shortness of breath.    . Ascorbic Acid (VITAMIN C) 1000 MG tablet Take 1,000 mg by mouth daily.    Marland Kitchen aspirin EC 81 MG EC tablet Take 1 tablet (81 mg total) by mouth daily. 30 tablet 3  . buPROPion (WELLBUTRIN XL) 150 MG 24 hr tablet Take 150 mg by mouth daily.     . cholecalciferol (VITAMIN D) 1000 UNITS tablet Take 1,000 Units by mouth daily.     . clonazePAM (KLONOPIN) 1 MG tablet Take 1 mg by mouth 2 (two) times daily.  5    . diazepam (VALIUM) 5 MG tablet Take 0.5-1 tablets (2.5-5 mg total) by mouth every 8 (eight) hours as needed for muscle spasms. 12 tablet 0  . diltiazem (CARDIZEM CD) 120 MG 24 hr capsule Take 1 capsule (120 mg total) by mouth daily. 30 capsule 3  . DULoxetine (CYMBALTA) 60 MG capsule Take 60 mg by mouth at bedtime.     . feeding supplement, ENSURE ENLIVE, (ENSURE ENLIVE) LIQD Take 237 mLs by mouth 2 (two) times daily between meals. 237 mL 12  . fenofibrate (TRICOR) 48 MG tablet Take 48 mg by mouth daily.   6  . ferrous sulfate 325 (65 FE) MG tablet Take 325 mg by mouth daily with breakfast.    . HYDROcodone-acetaminophen (NORCO) 5-325 MG per tablet Take 1 tablet by mouth 2 (two) times daily as needed for pain.     . Magnesium 500 MG CAPS Take 500 mg by mouth daily.     . nitroGLYCERIN (NITROSTAT) 0.4 MG SL tablet Place 1 tablet (0.4 mg total) under the tongue every 5 (five) minutes x 3 doses as needed for chest pain. 10 tablet 0  . pantoprazole (PROTONIX) 40 MG tablet Take 1 tablet (40 mg total) by mouth daily as needed. (Patient taking differently: Take 40 mg by mouth daily as needed (for acid reflux). ) 20 tablet 0  . SUPER B COMPLEX/C PO Take 1 tablet by mouth daily.      No current facility-administered medications for this visit.     Allergies:   Midazolam hcl; Prednisone; Statins; Ace inhibitors; Amoxicillin; Nsaids; and Sulfa antibiotics   Social History:  The patient  reports that she has been smoking.  She has a 60.00 pack-year smoking history. She has never used smokeless tobacco. She reports that she does not drink alcohol or use drugs.   Family History:  The patient's family history includes Alzheimer's disease in her mother; Breast cancer in her mother; Coronary artery disease in her mother; Heart disease in her father; Hypertension in her brother, daughter, father, mother, and sister; Kidney failure in her father.    ROS: Please see the history of present illness. Otherwise  review of systems is positive for Appetite change, sweating, leg swelling, cough, dyspnea on exertion, anxiety, balance problems, headaches. All other systems reviewed and negative.  PHYSICAL EXAM: VS:  BP (!) 160/84   Pulse 72   Ht 4\' 11"  (1.499 m)   Wt 155 lb (70.3 kg)   LMP 09/06/2005   BMI 31.31 kg/m  , BMI Body mass index is 31.31 kg/m. GEN: Well nourished, well developed, in no acute distress  HEENT: normal  Neck: no JVD,  carotid bruits, or masses Cardiac: RRR; no murmurs, rubs, or gallops,no edema  Respiratory:  clear to auscultation bilaterally, normal work of breathing GI: soft, nontender, nondistended, + BS MS: no deformity or atrophy  Skin: warm and dry Neuro:  Strength and sensation are intact Psych: euthymic mood, full affect   EKG:  EKG is not ordered today. Personal review of the ECG ordered 01/02/17 shows sinus rhythm, APCs, rate 69  Recent Labs: 01/02/2017: ALT 39; BUN 14; Creatinine, Ser 0.97; Hemoglobin 14.1; Magnesium 2.1; Platelets 176; Potassium 3.8; Sodium 139; TSH 0.505    Lipid Panel     Component Value Date/Time   CHOL 154 10/15/2016 0405   TRIG 127 10/15/2016 0405   HDL 39 (L) 10/15/2016 0405   CHOLHDL 3.9 10/15/2016 0405   VLDL 25 10/15/2016 0405   LDLCALC 90 10/15/2016 0405     Wt Readings from Last 3 Encounters:  04/05/17 155 lb (70.3 kg)  02/24/17 146 lb (66.2 kg)  01/04/17 135 lb 11.2 oz (61.6 kg)      Other studies Reviewed: Additional studies/ records that were reviewed today include: Cardiac cath 08/23/16, TTE 08/24/16   Prox RCA lesion, 45 %stenosed.  Ost LAD to Prox LAD lesion, 25 %stenosed.  Mid LAD to Dist LAD lesion, 40 %stenosed.  Dist LAD lesion, 60 %stenosed.  Ost 1st Mrg lesion, 20 %stenosed.  The left ventricular systolic function is normal.  LV end diastolic pressure is normal.  Ost 2nd Mrg lesion, 90 %stenosed.  - Left ventricle: The cavity size was normal. There was mild   concentric hypertrophy. Systolic  function was normal. The   estimated ejection fraction was in the range of 60% to 65%. Wall   motion was normal; there were no regional wall motion   abnormalities. Doppler parameters are consistent with abnormal   left ventricular relaxation (grade 1 diastolic dysfunction). - Mitral valve: Calcified annulus.  Holter 10/03/17 personally reviewed Minimum HR: 51 BPM at 6:04:06 AM Maximum HR: 113 BPM at 4:02:31 PM Average HR: 79 BPM Sinus rhythm 1.4% PACs APCs occasionally associated with palpitations  ASSESSMENT AND PLAN:  1.  Coronary artery disease: Recent catheterization showing coronary artery disease. Determine that she should have medical management. No changes necessary today.   2. Hypertension: Elevated today but has been normal in the past. We'll continue further monitoring.  3. Hyperlipidemia: Has been unable to tolerate statins in the past.  Plan to check fasting lipid panel today.  4. Mid RP tachycardia: Holter monitor without evidence of atrial fibrillation..  No further recurrence of tachycardia. Continue current monitoring.  5. Atrial fibrillation: Found after addition of adenosine. 48-hour monitor without atrial fibrillation. No anticoagulation as this was post medication management.  Current medicines are reviewed at length with the patient today.   The patient does not have concerns regarding her medicines.  The following changes were made today:  Increase Imdur to 60 mg, decrease aspirin to 81 mg  Labs/ tests ordered today include:  Orders Placed This Encounter  Procedures  . Lipid panel     Disposition:   FU with [facility] 6 months  Signed, Pallie Swigert Meredith Leeds, MD  04/05/2017 4:29 PM     [place] [address] [place] [address] [phone] (office) [phone] (fax)

## 2017-04-05 NOTE — Patient Instructions (Signed)
Medication Instructions:    Your physician recommends that you continue on your current medications as directed. Please refer to the Current Medication list given to you today.  Labwork:  Your physician recommends that you return for lab work: fasting lipid panel.. YOU WILL NEED TO BE FASTING FOR THIS BLOOD WORK  Testing/Procedures:  None ordered  Follow-Up:  Your physician wants you to follow-up in: 6 months with Dr. Curt Bears.  You will receive a reminder letter in the mail two months in advance. If you don't receive a letter, please call our office to schedule the follow-up appointment.  - If you need a refill on your cardiac medications before your next appointment, please call your pharmacy.   Thank you for choosing [facility]!!   Trinidad Curet, RN [phone]

## 2017-04-06 ENCOUNTER — Other Ambulatory Visit (INDEPENDENT_AMBULATORY_CARE_PROVIDER_SITE_OTHER): Payer: Medicare Other

## 2017-04-06 DIAGNOSIS — I2510 Atherosclerotic heart disease of native coronary artery without angina pectoris: Secondary | ICD-10-CM | POA: Diagnosis not present

## 2017-04-06 LAB — LIPID PANEL
CHOL/HDL RATIO: 4.7 ratio — AB (ref 0.0–4.4)
Cholesterol, Total: 237 mg/dL — ABNORMAL HIGH (ref 100–199)
HDL: 50 mg/dL (ref >39)
LDL CALC: 136 mg/dL — AB (ref 0–99)
TRIGLYCERIDES: 254 mg/dL — AB (ref 0–149)
VLDL Cholesterol Cal: 51 mg/dL — ABNORMAL HIGH (ref 5–40)

## 2017-04-17 DIAGNOSIS — F331 Major depressive disorder, recurrent, moderate: Secondary | ICD-10-CM | POA: Diagnosis not present

## 2017-04-17 DIAGNOSIS — F17201 Nicotine dependence, unspecified, in remission: Secondary | ICD-10-CM | POA: Diagnosis not present

## 2017-04-17 DIAGNOSIS — J301 Allergic rhinitis due to pollen: Secondary | ICD-10-CM | POA: Diagnosis not present

## 2017-04-17 DIAGNOSIS — G4733 Obstructive sleep apnea (adult) (pediatric): Secondary | ICD-10-CM | POA: Diagnosis not present

## 2017-04-17 DIAGNOSIS — J309 Allergic rhinitis, unspecified: Secondary | ICD-10-CM | POA: Diagnosis not present

## 2017-04-17 DIAGNOSIS — M542 Cervicalgia: Secondary | ICD-10-CM | POA: Diagnosis not present

## 2017-04-19 ENCOUNTER — Ambulatory Visit (INDEPENDENT_AMBULATORY_CARE_PROVIDER_SITE_OTHER): Payer: Medicare Other | Admitting: Pharmacist

## 2017-04-19 DIAGNOSIS — I2510 Atherosclerotic heart disease of native coronary artery without angina pectoris: Secondary | ICD-10-CM

## 2017-04-19 DIAGNOSIS — E785 Hyperlipidemia, unspecified: Secondary | ICD-10-CM | POA: Diagnosis not present

## 2017-04-19 MED ORDER — ROSUVASTATIN CALCIUM 5 MG PO TABS: 5.0000 mg | ORAL_TABLET | Freq: Every day | ORAL | 11 refills | Status: DC

## 2017-04-19 NOTE — Progress Notes (Signed)
Patient ID: [name]                 DOB: 1956/08/06                    MRN: [id number]     HPI: [name] is a 61 y.o. female patient referred to lipid clinic by Dr. Curt Bears. She has a PMH of coronary artery disease w/ recent cath (05/23/2016) that showed disease w/ plan for medical management and lifestyle interventions, hypertension, SVT, and hyperlipidemia.  Pt stated that she tried two statins in the past, Crestor and 1 other unknown statin. With the unknown statin she had a rash, and with both of them she had joint pain after about a month that resolved with discontinuation. She reports that she still has joint pain to this day due to arthritis, but that the pain she originally experienced was different because it involved her muscles. She currently eats at a buffet every day for lunch, enjoys several days of chinese food a week, loves fried chicken and drinks ~1/2 dozen regular cokes + sweet tea on a daily basis. One accomplishment is her continued smoking cessation since January 2018.  Current Medications: fenofibrate 48mg  daily  Intolerances: Crestor (unknown dose) and 1 other statin - with first statin she experienced joint pain that affected mobility and rash. During Crestor trial, joints started hurting after about a month. Risk Factors: CAD, HTN, HLD LDL goal: <  70mg /dL  Diet: Dietary indiscretion - eats "anything and everything", 6 12 oz regular cokes a day, home style family buffet every week day for lunch, a lot of sweet tea, chinese food, and fried chicken  Exercise: walk around barn every day, yard work on occasion  Family History: Mother - HTN/CAD, father - HTN/heart disease, brother - HTN, sister - HTN.  Social History: Previous smoker. She has a 60.00 pack-year smoking history. Recently stopped smoking (January 2018). She reports that she does not drink alcohol or use drugs.   Labs: 04/06/2017: TC 237, TG 254, HDL 50, LDL 136 (on fenofibrate 48mg  daily) 10/15/2016:  TC 154, TG 127, HDL 39, LDL 90  Past Medical History:  Diagnosis Date  . Agoraphobia without history of panic disorder   . Anxiety   . Asthma   . Coronary artery disease CARDIOLOGIST- DR  Doylene Canard  (VISIT 03-30-11 W/ CHART)   NON-OBS. CAD   (STRESS TEST NOV. 2011  . DDD (degenerative disc disease)   . Depression   . DJD (degenerative joint disease)    JOINT PAIN  . Fibromyalgia   . Frequency of urination   . GERD (gastroesophageal reflux disease) AND HIATIAL HERNIA   CONTROLLED W/ NEXIUM  . Hemorrhoids   . History of gastric ulcer 2004  . Hypertension   . IBS (irritable bowel syndrome)   . Incisional pain s/p interstim implant 1st stage--- 12-07-11    left upper buttock-- pt states dressing clean dry and intact (on 12-08-11)  . Insomnia   . Iron deficiency anemia   . Nocturia   . Non-productive cough   . Numbness in both hands AT TIMES  . OSA on CPAP   . Urge urinary incontinence     Current Outpatient Prescriptions on File Prior to Visit  Medication Sig Dispense Refill  . acetaminophen (TYLENOL) 650 MG CR tablet Take 1,300 mg by mouth 2 (two) times daily as needed for pain.    Marland Kitchen albuterol (PROVENTIL HFA;VENTOLIN HFA) 108 (90 BASE) MCG/ACT inhaler  Inhale 2 puffs into the lungs every 6 (six) hours as needed for wheezing or shortness of breath.    . Ascorbic Acid (VITAMIN C) 1000 MG tablet Take 1,000 mg by mouth daily.    Marland Kitchen aspirin EC 81 MG EC tablet Take 1 tablet (81 mg total) by mouth daily. 30 tablet 3  . buPROPion (WELLBUTRIN XL) 150 MG 24 hr tablet Take 150 mg by mouth daily.     . cholecalciferol (VITAMIN D) 1000 UNITS tablet Take 1,000 Units by mouth daily.     . clonazePAM (KLONOPIN) 1 MG tablet Take 1 mg by mouth 2 (two) times daily.  5  . diazepam (VALIUM) 5 MG tablet Take 0.5-1 tablets (2.5-5 mg total) by mouth every 8 (eight) hours as needed for muscle spasms. 12 tablet 0  . diltiazem (CARDIZEM CD) 120 MG 24 hr capsule Take 1 capsule (120 mg total) by mouth daily.  30 capsule 3  . DULoxetine (CYMBALTA) 60 MG capsule Take 60 mg by mouth at bedtime.     . feeding supplement, ENSURE ENLIVE, (ENSURE ENLIVE) LIQD Take 237 mLs by mouth 2 (two) times daily between meals. 237 mL 12  . fenofibrate (TRICOR) 48 MG tablet Take 48 mg by mouth daily.   6  . ferrous sulfate 325 (65 FE) MG tablet Take 325 mg by mouth daily with breakfast.    . HYDROcodone-acetaminophen (NORCO) 5-325 MG per tablet Take 1 tablet by mouth 2 (two) times daily as needed for pain.     . Magnesium 500 MG CAPS Take 500 mg by mouth daily.     . nitroGLYCERIN (NITROSTAT) 0.4 MG SL tablet Place 1 tablet (0.4 mg total) under the tongue every 5 (five) minutes x 3 doses as needed for chest pain. 10 tablet 0  . pantoprazole (PROTONIX) 40 MG tablet Take 1 tablet (40 mg total) by mouth daily as needed. (Patient taking differently: Take 40 mg by mouth daily as needed (for acid reflux). ) 20 tablet 0  . SUPER B COMPLEX/C PO Take 1 tablet by mouth daily.      No current facility-administered medications on file prior to visit.     Allergies  Allergen Reactions  . Midazolam Hcl Other (See Comments)    HYPER  . Prednisone Other (See Comments)    HYPER, depressed, moody  . Statins Other (See Comments)    Causes muscle weakness and joint pain  . Ace Inhibitors Other (See Comments)    DECREASES HEARTRATE  . Amoxicillin Diarrhea  . Nsaids Other (See Comments)    AVOIDS HX GASTRIC ULCER  . Sulfa Antibiotics Rash and Other (See Comments)    JOINT PAIN    Assessment/Plan:  1. Dyslipidemia - Patient is currently not at goal LDL < 70 mg/dL given hx of CAD. Her TG are also elevated. Her lifestyle and poor diet are likely major contributors to this. She has tried two statins in the past with associated joint and muscle pain. She was unsure of the dose or the name of one of them. Due to this, will trial low dose Crestor 5mg  daily to see if she can tolerate. Continue fenofibrate due to elevated TG. Instructed  pt to follow up with any concerns or side effects. Will follow up in 3 months to check labs and assess tolerability of Crestor.  2. Lifestyle - Patient currently drinks > 6 12oz regular cokes a day + sweet tea, chinese, country style buffet, and fried chicken daily. Extensively counseled pt on  the importance of a low fat/cholesterol diet and risks of poor diet, including HTN, diabetes, heart disease, high cholesterol, and stroke. Provided the patient with a healthy food reference and talked to her about some healthy options that she already enjoys (shellfish, chicken, and strawberries). 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 Will follow up in 3 months to see how diet has improved.  Melburn Popper, PharmD PGY2 Pharmacy Resident 04/19/17 8:20 AM  Megan E. Supple, PharmD, CPP, [place] [address] [place], [place] [address] Phone: [phone]; Fax: [phone]

## 2017-04-19 NOTE — Patient Instructions (Addendum)
It was great to see you today.  Please start taking Crestor 5mg  daily.  Continue taking fenofibrate in the morning with a meal.  Continue the rest of your medications.  Keep working on your diet: decrease the amount of sodas and sugary drinks you drink a day, increase the amount of chicken, fish, and even shellfish that you eat during the day. Read through the diet handbook that we provided you and see what you can do!  We will follow up with you to see how you are tolerating your statin, and you can call us with any concerns or side effects # [phone]  We will see you in about 3 months to check your labs to see how your diet and Crestor are working. Come in any time after 7:30am on Wednesday, August 1st for fasting labs.

## 2017-05-16 DIAGNOSIS — Z1231 Encounter for screening mammogram for malignant neoplasm of breast: Secondary | ICD-10-CM | POA: Diagnosis not present

## 2017-05-16 DIAGNOSIS — Z803 Family history of malignant neoplasm of breast: Secondary | ICD-10-CM | POA: Diagnosis not present

## 2017-05-21 DIAGNOSIS — F17210 Nicotine dependence, cigarettes, uncomplicated: Secondary | ICD-10-CM | POA: Diagnosis not present

## 2017-05-21 DIAGNOSIS — I2510 Atherosclerotic heart disease of native coronary artery without angina pectoris: Secondary | ICD-10-CM | POA: Diagnosis not present

## 2017-05-21 DIAGNOSIS — I10 Essential (primary) hypertension: Secondary | ICD-10-CM | POA: Diagnosis not present

## 2017-05-21 DIAGNOSIS — E784 Other hyperlipidemia: Secondary | ICD-10-CM | POA: Diagnosis not present

## 2017-05-21 DIAGNOSIS — M545 Low back pain: Secondary | ICD-10-CM | POA: Diagnosis not present

## 2017-07-18 ENCOUNTER — Other Ambulatory Visit: Payer: Medicare Other | Admitting: *Deleted

## 2017-07-18 DIAGNOSIS — E785 Hyperlipidemia, unspecified: Secondary | ICD-10-CM

## 2017-07-18 LAB — HEPATIC FUNCTION PANEL
ALT: 29 IU/L (ref 0–32)
AST: 30 IU/L (ref 0–40)
Albumin: 4.7 g/dL (ref 3.6–4.8)
Alkaline Phosphatase: 79 IU/L (ref 39–117)
BILIRUBIN TOTAL: 0.4 mg/dL (ref 0.0–1.2)
BILIRUBIN, DIRECT: 0.13 mg/dL (ref 0.00–0.40)
Total Protein: 6.8 g/dL (ref 6.0–8.5)

## 2017-07-18 LAB — LIPID PANEL
CHOLESTEROL TOTAL: 162 mg/dL (ref 100–199)
Chol/HDL Ratio: 4 ratio (ref 0.0–4.4)
HDL: 41 mg/dL (ref >39)
LDL Calculated: 80 mg/dL (ref 0–99)
Triglycerides: 207 mg/dL — ABNORMAL HIGH (ref 0–149)
VLDL Cholesterol Cal: 41 mg/dL — ABNORMAL HIGH (ref 5–40)

## 2017-07-18 NOTE — Progress Notes (Signed)
221

## 2017-07-31 DIAGNOSIS — H2511 Age-related nuclear cataract, right eye: Secondary | ICD-10-CM | POA: Diagnosis not present

## 2017-07-31 DIAGNOSIS — Z961 Presence of intraocular lens: Secondary | ICD-10-CM | POA: Diagnosis not present

## 2017-08-01 DIAGNOSIS — H2511 Age-related nuclear cataract, right eye: Secondary | ICD-10-CM | POA: Diagnosis not present

## 2017-08-06 ENCOUNTER — Telehealth: Payer: Self-pay | Admitting: Cardiology

## 2017-08-06 DIAGNOSIS — H2511 Age-related nuclear cataract, right eye: Secondary | ICD-10-CM | POA: Diagnosis not present

## 2017-08-06 DIAGNOSIS — E785 Hyperlipidemia, unspecified: Secondary | ICD-10-CM | POA: Insufficient documentation

## 2017-08-06 MED ORDER — ROSUVASTATIN CALCIUM 10 MG PO TABS: 10.0000 mg | ORAL_TABLET | Freq: Every day | ORAL | 3 refills | Status: DC

## 2017-08-06 NOTE — Telephone Encounter (Signed)
New message ° ° ° ° ° °Returning a call to the nurse to get test results °

## 2017-08-06 NOTE — Telephone Encounter (Signed)
Notes recorded by Stanton Kidney, RN on 08/03/2017 at 5:10 PM EDT lmtcb 8/17 ------  Notes recorded by Erskine Emery, RPH on 07/27/2017 at 2:35 PM EDT Patient TG slightly improved, LDL improved but still not less than 70. Would recommend increase Crestor to 10mg  daily if patient willing with recheck of lipids/hepatic panel in 8-12 weeks. ------  Notes recorded by Rodman Key, RN on 07/26/2017 at 11:30 AM EDT Forwarded to Lipid clinic ------  Notes recorded by Constance Haw, MD on 07/18/2017 at 3:52 PM EDT Lipids as above. Continue follow up with lipid clinic.   Spoke with the pt and informed her of both Dr. Curt Bears and Eino Farber PharmD recommendations, based on her recent labs done.  Pt agreed to increased her Crestor to 10 mg po daily.  Confirmed the pharmacy of choice with the pt.  Scheduled the pt to come back in for labs to recheck lipids/Lfts same day as she see's Dr Curt Bears on 10/24.  Pt is aware to come fasting to this lab appt.  Pt verbalized understanding and agrees with this plan.

## 2017-08-09 IMAGING — DX DG CHEST 2V
2 series · 2 of 2 positions shown · non-contrast
Comparison: September 03, 2014

CLINICAL DATA: Tachycardia and shortness of breath

EXAM:
CHEST  2 VIEW

[chest pa]
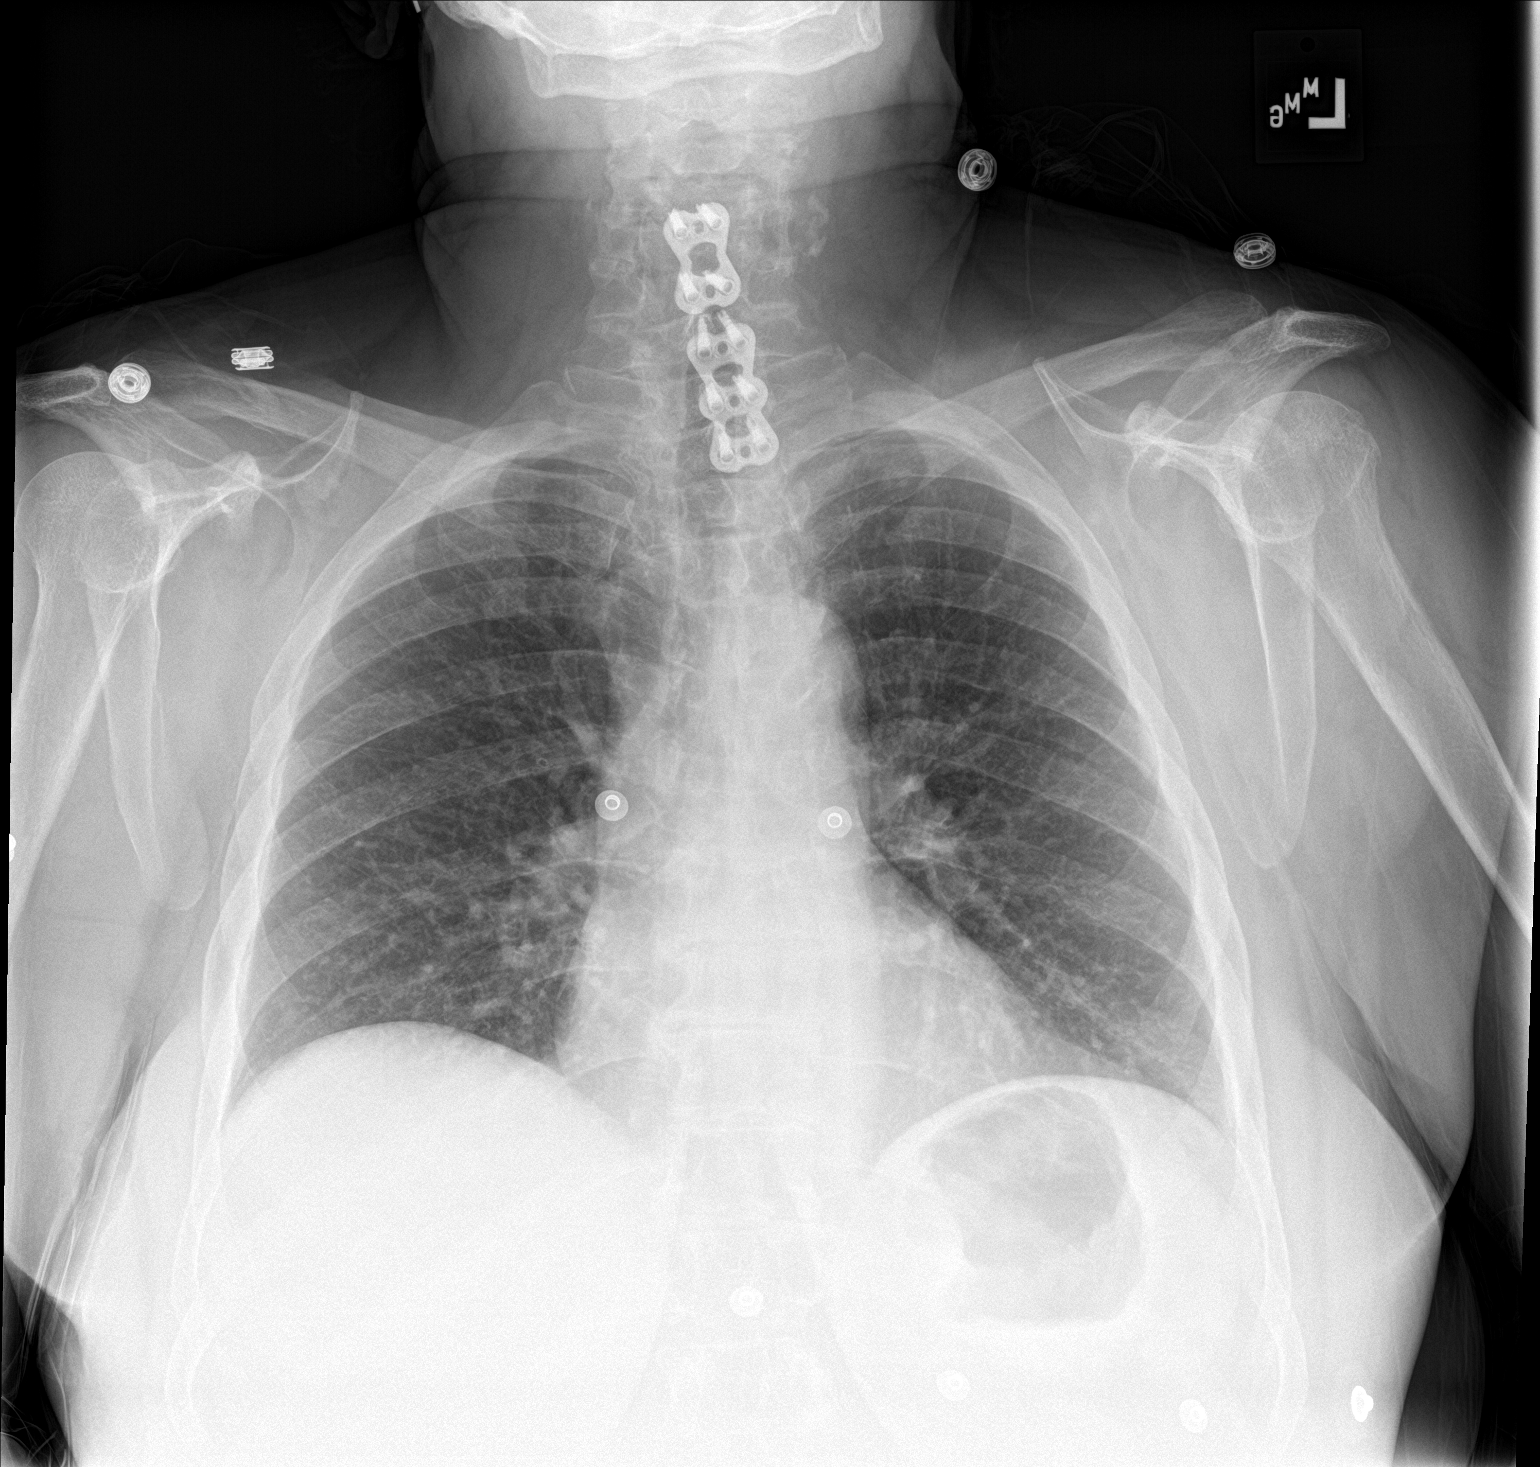

[chest lat]
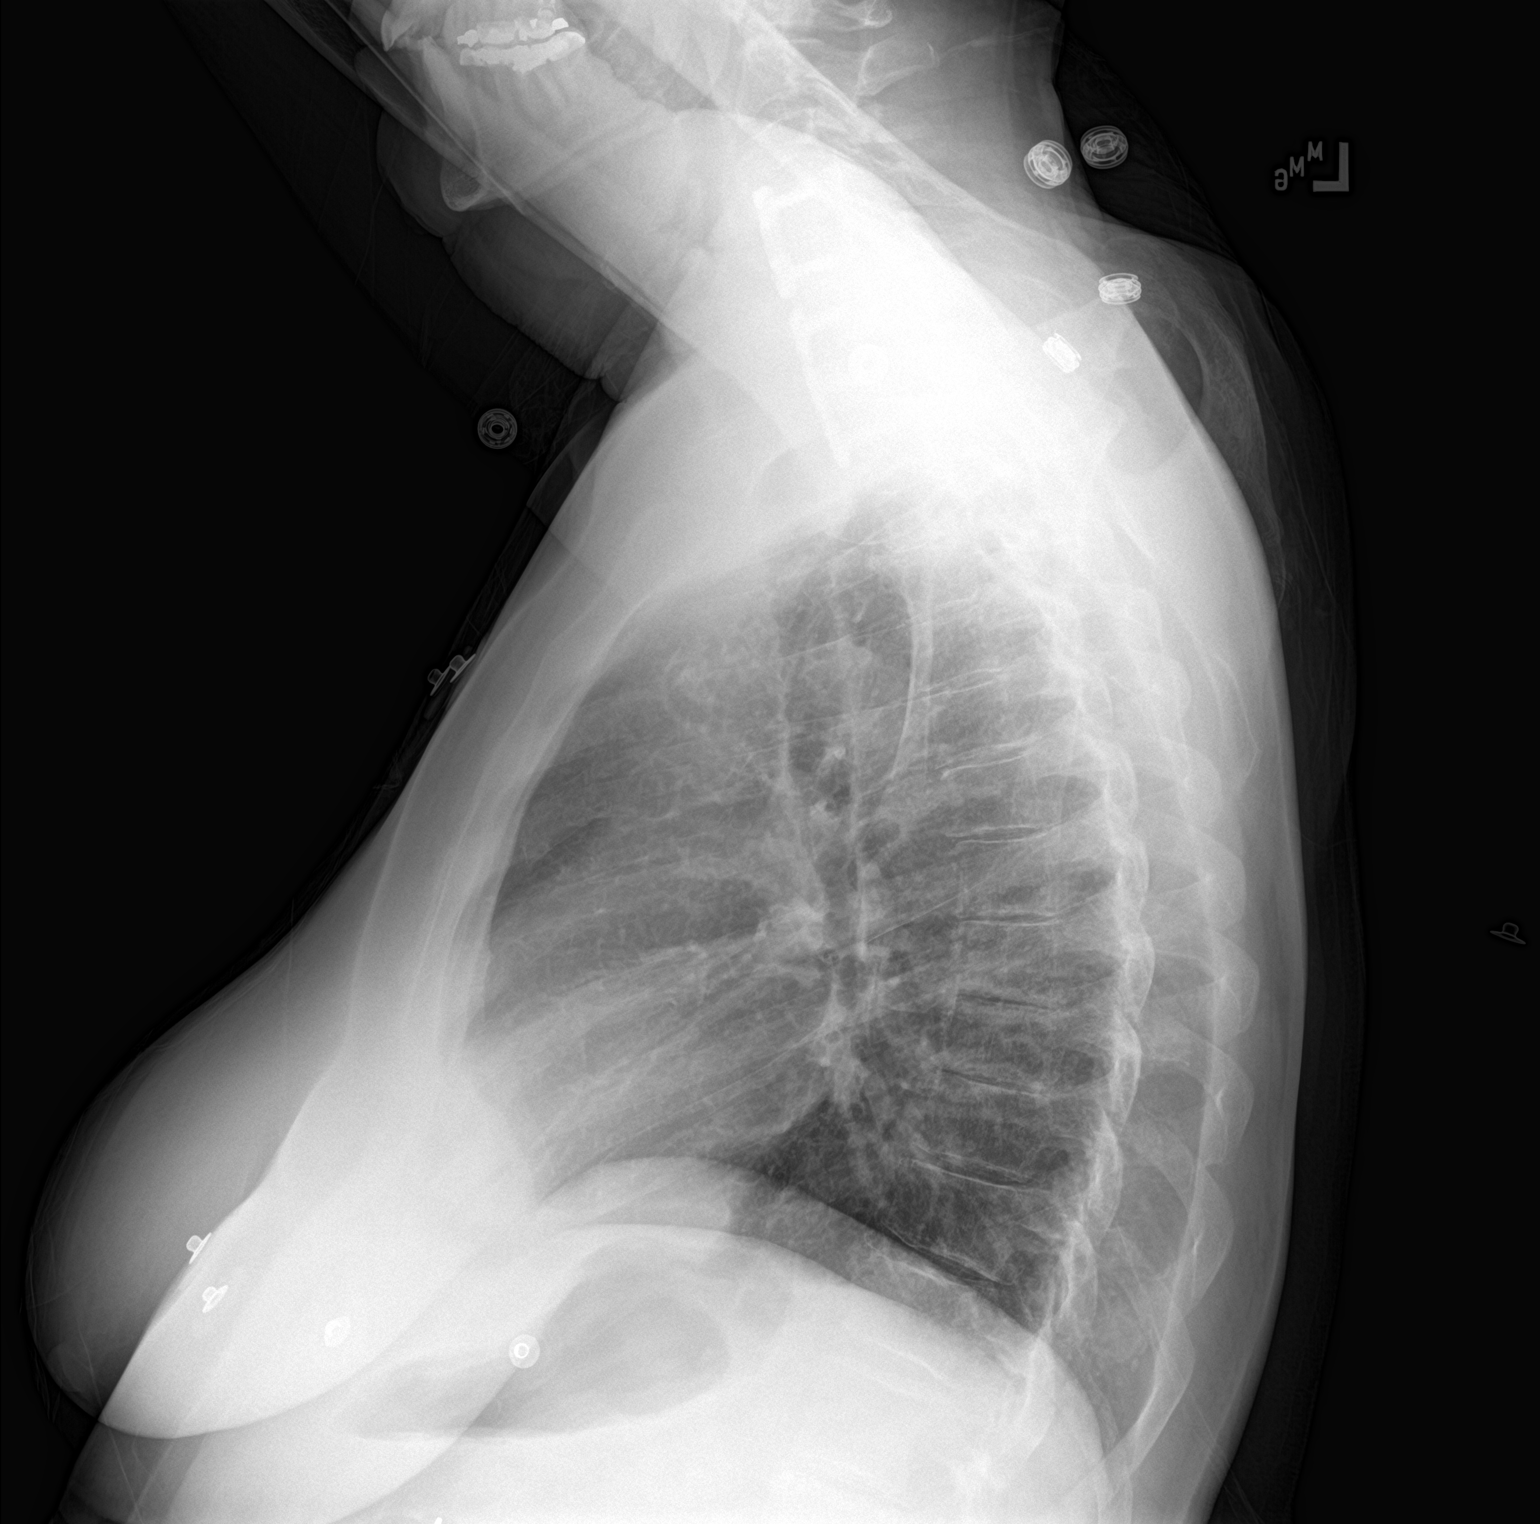

[2 of 2 positions shown; findings below may reference images not displayed]

FINDINGS: The heart size and mediastinal contours are within normal limits.
Both lungs are clear. The visualized skeletal structures are
unremarkable.
IMPRESSION: No active cardiopulmonary disease.

## 2017-08-21 ENCOUNTER — Telehealth: Payer: Self-pay | Admitting: Cardiology

## 2017-08-21 DIAGNOSIS — I10 Essential (primary) hypertension: Secondary | ICD-10-CM | POA: Diagnosis not present

## 2017-08-21 DIAGNOSIS — F17210 Nicotine dependence, cigarettes, uncomplicated: Secondary | ICD-10-CM | POA: Diagnosis not present

## 2017-08-21 DIAGNOSIS — I2510 Atherosclerotic heart disease of native coronary artery without angina pectoris: Secondary | ICD-10-CM | POA: Diagnosis not present

## 2017-08-21 DIAGNOSIS — M545 Low back pain: Secondary | ICD-10-CM | POA: Diagnosis not present

## 2017-08-21 NOTE — Telephone Encounter (Signed)
Mrs. Bonello is calling because her Blood pressure has been running high and [name] is wanting to put her back Amlodipine and she is wanting to get Dr. Curt Bears advice to whether or not to go back on the medicine . Please call

## 2017-08-21 NOTE — Telephone Encounter (Addendum)
Advised ok to restart Amlodipine and that it is ok with Dr. Curt Bears. Pt thanks me for calling her back, she  just wanted to make sure.

## 2017-08-27 DIAGNOSIS — I2510 Atherosclerotic heart disease of native coronary artery without angina pectoris: Secondary | ICD-10-CM | POA: Diagnosis not present

## 2017-08-27 DIAGNOSIS — I10 Essential (primary) hypertension: Secondary | ICD-10-CM | POA: Diagnosis not present

## 2017-08-27 DIAGNOSIS — F17210 Nicotine dependence, cigarettes, uncomplicated: Secondary | ICD-10-CM | POA: Diagnosis not present

## 2017-08-27 DIAGNOSIS — M545 Low back pain: Secondary | ICD-10-CM | POA: Diagnosis not present

## 2017-09-14 DIAGNOSIS — Z23 Encounter for immunization: Secondary | ICD-10-CM | POA: Diagnosis not present

## 2017-10-09 NOTE — Progress Notes (Signed)
Cardiology Office Note   Date:  10/10/2017   ID:  Zafira, Munos 06-May-1956, MRN [id number]  PCP:  Maurice Small, MD  Cardiologist:  Dominion Kathan Meredith Leeds, MD    Chief Complaint  Patient presents with  . Follow-up    SVT  . Shortness of Breath     History of Present Illness: [name] is a 61 y.o. female who presents today for cardiology evaluation.   She has history of coronary artery disease, hypertension, hyperlipidemia. She says that she had a heart catheterization 2003 that showed minimal coronary artery disease. Since then she has had stress tests and echocardiograms yearly. Admitted to the hospital with chest pain and SVT.  She was started on 12.5 mg metoprolol for her SVT. Was also noted to have AF post adenosine but was short lived. Heart catheterization was done which showed thickened coronary artery disease as below. No intervention was performed.   Today, denies symptoms of palpitations, shortness of breath, orthopnea, PND, lower extremity edema, claudication, dizziness, presyncope, syncope, bleeding, or neurologic sequela. The patient is tolerating medications without difficulties. She is not had palpitations since last being seen. She has been having episodes of chest pain. The occur both at rest and when she exerts herself, as well as after eating. She has not taken any of her nitroglycerin.   Past Medical History:  Diagnosis Date  . Agoraphobia without history of panic disorder   . Anxiety   . Asthma   . Coronary artery disease CARDIOLOGIST- DR  Doylene Canard  (VISIT 03-30-11 W/ CHART)   NON-OBS. CAD   (STRESS TEST NOV. 2011  . DDD (degenerative disc disease)   . Depression   . DJD (degenerative joint disease)    JOINT PAIN  . Fibromyalgia   . Frequency of urination   . GERD (gastroesophageal reflux disease) AND HIATIAL HERNIA   CONTROLLED W/ NEXIUM  . Hemorrhoids   . History of gastric ulcer 2004  . Hypertension   . IBS (irritable bowel syndrome)   .  Incisional pain s/p interstim implant 1st stage--- 12-07-11    left upper buttock-- pt states dressing clean dry and intact (on 12-08-11)  . Insomnia   . Iron deficiency anemia   . Nocturia   . Non-productive cough   . Numbness in both hands AT TIMES  . OSA on CPAP   . Urge urinary incontinence    Past Surgical History:  Procedure Laterality Date  . CARDIAC CATHETERIZATION  2003  . CARDIAC CATHETERIZATION N/A 08/23/2016   Procedure: Left Heart Cath and Coronary Angiography;  Surgeon: Dixie Dials, MD;  Location: [place] [facility];  Service: Cardiovascular;  Laterality: N/A;  . CERVICAL DISCECTOMY  2002   C4 - 7  . CERVICAL FUSION  2008   C4 - T1  . CHOLECYSTECTOMY  1982  . CYSTO/ HOD/ BLADDER BX  2006  . CYSTO/ HOD/ BLADDER BX/ FULGERATION  06-12-2011  . HYSTEROSCOPY W/D&C  2005  . INTERSTIM IMPLANT PLACEMENT  12/07/2011   Procedure: Barrie Lyme IMPLANT FIRST STAGE;  Surgeon: [name], MD;  Location: [facility];  Service: Urology;  Laterality: Right;  . INTERSTIM IMPLANT PLACEMENT  12/14/2011   Procedure: Barrie Lyme IMPLANT SECOND STAGE;  Surgeon: [name], MD;  Location: [facility];  Service: Urology;  Laterality: Right;  rad tech ok by vickie at [facility]  . LAMINECTOMY  2007   L3 - 5  . RIGHT THUMB SURG.  2001  .  TONSILLECTOMY AND ADENOIDECTOMY  1965  . WRIST SURGERY Left    [facility]     Current Outpatient Prescriptions  Medication Sig Dispense Refill  . acetaminophen (TYLENOL) 650 MG CR tablet Take 1,300 mg by mouth 2 (two) times daily as needed for pain.    Marland Kitchen albuterol (PROVENTIL HFA;VENTOLIN HFA) 108 (90 BASE) MCG/ACT inhaler Inhale 2 puffs into the lungs every 6 (six) hours as needed for wheezing or shortness of breath.    Marland Kitchen amLODipine (NORVASC) 5 MG tablet Take 5 mg by mouth daily.  3  . Ascorbic Acid (VITAMIN C) 1000 MG tablet Take 1,000 mg by mouth daily.    Marland Kitchen aspirin EC 81 MG EC tablet Take 1 tablet (81 mg total) by mouth  daily. 30 tablet 3  . buPROPion (WELLBUTRIN XL) 300 MG 24 hr tablet Take 300 mg by mouth daily.    . cholecalciferol (VITAMIN D) 1000 UNITS tablet Take 1,000 Units by mouth daily.     . clonazePAM (KLONOPIN) 1 MG tablet Take 1 mg by mouth 2 (two) times daily.  5  . diltiazem (CARDIZEM CD) 120 MG 24 hr capsule Take 1 capsule (120 mg total) by mouth daily. 30 capsule 3  . DULoxetine (CYMBALTA) 60 MG capsule Take 60 mg by mouth at bedtime.     . fenofibrate (TRICOR) 48 MG tablet Take 48 mg by mouth daily.   6  . ferrous sulfate 325 (65 FE) MG tablet Take 325 mg by mouth daily with breakfast.    . Magnesium 500 MG CAPS Take 500 mg by mouth daily.     . montelukast (SINGULAIR) 10 MG tablet Take 10 mg by mouth at bedtime.    . nitroGLYCERIN (NITROSTAT) 0.4 MG SL tablet Place 1 tablet (0.4 mg total) under the tongue every 5 (five) minutes x 3 doses as needed for chest pain. 10 tablet 0  . pantoprazole (PROTONIX) 40 MG tablet Take 1 tablet (40 mg total) by mouth daily as needed. (Patient taking differently: Take 40 mg by mouth daily as needed (for acid reflux). ) 20 tablet 0  . rosuvastatin (CRESTOR) 10 MG tablet Take 1 tablet (10 mg total) by mouth daily. 90 tablet 3  . SUPER B COMPLEX/C PO Take 1 tablet by mouth daily.      No current facility-administered medications for this visit.     Allergies:   Midazolam hcl; Prednisone; Statins; Ace inhibitors; Amoxicillin; Nsaids; and Sulfa antibiotics   Social History:  The patient  reports that she has been smoking.  She has a 60.00 pack-year smoking history. She has never used smokeless tobacco. She reports that she does not drink alcohol or use drugs.   Family History:  The patient's family history includes Alzheimer's disease in her mother; Breast cancer in her mother; Coronary artery disease in her mother; Heart disease in her father; Hypertension in her brother, daughter, father, mother, and sister; Kidney failure in her father.    ROS:  Please see  the history of present illness.   Otherwise, review of systems is positive for none.   All other systems are reviewed and negative.   PHYSICAL EXAM: VS:  BP 140/80   Pulse 65   Ht 4\' 11"  (1.499 m)   Wt 154 lb 12.8 oz (70.2 kg)   LMP 09/06/2005   SpO2 97%   BMI 31.27 kg/m  , BMI Body mass index is 31.27 kg/m. GEN: Well nourished, well developed, in no acute distress  HEENT: normal  Neck:  no JVD, carotid bruits, or masses Cardiac: RRR; no murmurs, rubs, or gallops,no edema  Respiratory:  clear to auscultation bilaterally, normal work of breathing GI: soft, nontender, nondistended, + BS MS: no deformity or atrophy  Skin: warm and dry Neuro:  Strength and sensation are intact Psych: euthymic mood, full affect  EKG:  EKG is not ordered today. Personal review of the ekg ordered 01/02/17 shows sinus rhythm, APCs, rate 69   Recent Labs: 01/02/2017: BUN 14; Creatinine, Ser 0.97; Hemoglobin 14.1; Magnesium 2.1; Platelets 176; Potassium 3.8; Sodium 139; TSH 0.505 07/18/2017: ALT 29    Lipid Panel     Component Value Date/Time   CHOL 162 07/18/2017 1029   TRIG 207 (H) 07/18/2017 1029   HDL 41 07/18/2017 1029   CHOLHDL 4.0 07/18/2017 1029   CHOLHDL 3.9 10/15/2016 0405   VLDL 25 10/15/2016 0405   LDLCALC 80 07/18/2017 1029     Wt Readings from Last 3 Encounters:  10/10/17 154 lb 12.8 oz (70.2 kg)  04/05/17 155 lb (70.3 kg)  02/24/17 146 lb (66.2 kg)      Other studies Reviewed: Additional studies/ records that were reviewed today include: Cardiac cath 08/23/16, TTE 08/24/16   Prox RCA lesion, 45 %stenosed.  Ost LAD to Prox LAD lesion, 25 %stenosed.  Mid LAD to Dist LAD lesion, 40 %stenosed.  Dist LAD lesion, 60 %stenosed.  Ost 1st Mrg lesion, 20 %stenosed.  The left ventricular systolic function is normal.  LV end diastolic pressure is normal.  Ost 2nd Mrg lesion, 90 %stenosed.  - Left ventricle: The cavity size was normal. There was mild   concentric hypertrophy.  Systolic function was normal. The   estimated ejection fraction was in the range of 60% to 65%. Wall   motion was normal; there were no regional wall motion   abnormalities. Doppler parameters are consistent with abnormal   left ventricular relaxation (grade 1 diastolic dysfunction). - Mitral valve: Calcified annulus.  Holter 10/03/17 personally reviewed Minimum HR: 51 BPM at 6:04:06 AM Maximum HR: 113 BPM at 4:02:31 PM Average HR: 79 BPM Sinus rhythm 1.4% PACs APCs occasionally associated with palpitations  ASSESSMENT AND PLAN:  1.  Coronary artery disease: Left heart catheterization showed Coronary artery disease. Lifestyle changes were recommended. She is having some episodes of chest pain, that she thinks may be due to reflux. I told her to try her nitroglycerin to see if this Arlisa Leclere improve her pain. If it does she may benefit from imdur.  2. Hypertension: Well-controlled today. No changes.  3. Hyperlipidemia: Unable to tolerate statins.  4. Mid RP tachycardia: No recurrence since discovered previously. No changes at this time.  5. Atrial fibrillation: Found after adenosine. 48-hour monitor without atrial fibrillation. Not anticoagulated at this time.  6. Sleep apnea: Is not wearing CPAP and is having significant fatigue. Encourage compliance.  7. Tobacco abuse: Encouraged cessation   Current medicines are reviewed at length with the patient today.   The patient does not have concerns regarding her medicines.  The following changes were made today: None  Labs/ tests ordered today include:  No orders of the defined types were placed in this encounter.    Disposition:   FU with Cornelia Walraven in 6  months  Signed, Lehi Phifer Meredith Leeds, MD  10/10/2017 10:12 AM     [facility] [address] [place] [address] [phone] (office) [phone] (fax)

## 2017-10-10 ENCOUNTER — Encounter: Payer: Self-pay | Admitting: Cardiology

## 2017-10-10 ENCOUNTER — Other Ambulatory Visit: Payer: No Typology Code available for payment source | Admitting: *Deleted

## 2017-10-10 ENCOUNTER — Ambulatory Visit (INDEPENDENT_AMBULATORY_CARE_PROVIDER_SITE_OTHER): Payer: Medicare Other | Admitting: Cardiology

## 2017-10-10 VITALS — BP 140/80 | HR 65 | Ht 59.0 in | Wt 154.8 lb

## 2017-10-10 DIAGNOSIS — Z720 Tobacco use: Secondary | ICD-10-CM

## 2017-10-10 DIAGNOSIS — I471 Supraventricular tachycardia, unspecified: Secondary | ICD-10-CM

## 2017-10-10 DIAGNOSIS — I2510 Atherosclerotic heart disease of native coronary artery without angina pectoris: Secondary | ICD-10-CM

## 2017-10-10 DIAGNOSIS — I209 Angina pectoris, unspecified: Secondary | ICD-10-CM

## 2017-10-10 DIAGNOSIS — E785 Hyperlipidemia, unspecified: Secondary | ICD-10-CM

## 2017-10-10 DIAGNOSIS — I10 Essential (primary) hypertension: Secondary | ICD-10-CM | POA: Diagnosis not present

## 2017-10-10 DIAGNOSIS — I25118 Atherosclerotic heart disease of native coronary artery with other forms of angina pectoris: Secondary | ICD-10-CM | POA: Diagnosis not present

## 2017-10-10 DIAGNOSIS — G4733 Obstructive sleep apnea (adult) (pediatric): Secondary | ICD-10-CM

## 2017-10-10 LAB — HEPATIC FUNCTION PANEL
ALT: 34 IU/L — ABNORMAL HIGH (ref 0–32)
AST: 34 IU/L (ref 0–40)
Albumin: 4.8 g/dL (ref 3.6–4.8)
Alkaline Phosphatase: 106 IU/L (ref 39–117)
BILIRUBIN TOTAL: 0.3 mg/dL (ref 0.0–1.2)
BILIRUBIN, DIRECT: 0.1 mg/dL (ref 0.00–0.40)
Total Protein: 7.2 g/dL (ref 6.0–8.5)

## 2017-10-10 LAB — LIPID PANEL
CHOL/HDL RATIO: 3.6 ratio (ref 0.0–4.4)
CHOLESTEROL TOTAL: 168 mg/dL (ref 100–199)
HDL: 47 mg/dL (ref >39)
LDL CALC: 81 mg/dL (ref 0–99)
Triglycerides: 202 mg/dL — ABNORMAL HIGH (ref 0–149)
VLDL CHOLESTEROL CAL: 40 mg/dL (ref 5–40)

## 2017-10-10 NOTE — Patient Instructions (Signed)
Medication Instructions:  Your physician recommends that you continue on your current medications as directed. Please refer to the Current Medication list given to you today.  Labwork: None ordered  Testing/Procedures: None ordered  Follow-Up: Your physician wants you to follow-up in: 6 months with Dr. Camnitz.  You will receive a reminder letter in the mail two months in advance. If you don't receive a letter, please call our office to schedule the follow-up appointment.  -- If you need a refill on your cardiac medications before your next appointment, please call your pharmacy. --  Thank you for choosing [facility]!!   Leniyah Martell, RN [phone]  Any Other Special Instructions Will Be Listed Below (If Applicable).        

## 2017-10-16 DIAGNOSIS — I10 Essential (primary) hypertension: Secondary | ICD-10-CM | POA: Diagnosis not present

## 2017-10-16 DIAGNOSIS — R7309 Other abnormal glucose: Secondary | ICD-10-CM | POA: Diagnosis not present

## 2017-10-16 DIAGNOSIS — E559 Vitamin D deficiency, unspecified: Secondary | ICD-10-CM | POA: Diagnosis not present

## 2017-10-16 DIAGNOSIS — Z0000 Encounter for general adult medical examination without abnormal findings: Secondary | ICD-10-CM | POA: Diagnosis not present

## 2017-10-16 DIAGNOSIS — E782 Mixed hyperlipidemia: Secondary | ICD-10-CM | POA: Diagnosis not present

## 2017-12-12 DIAGNOSIS — I2510 Atherosclerotic heart disease of native coronary artery without angina pectoris: Secondary | ICD-10-CM | POA: Diagnosis not present

## 2017-12-12 DIAGNOSIS — M545 Low back pain: Secondary | ICD-10-CM | POA: Diagnosis not present

## 2017-12-12 DIAGNOSIS — F17210 Nicotine dependence, cigarettes, uncomplicated: Secondary | ICD-10-CM | POA: Diagnosis not present

## 2017-12-12 DIAGNOSIS — R072 Precordial pain: Secondary | ICD-10-CM | POA: Diagnosis not present

## 2018-01-14 DIAGNOSIS — F17210 Nicotine dependence, cigarettes, uncomplicated: Secondary | ICD-10-CM | POA: Diagnosis not present

## 2018-01-14 DIAGNOSIS — I10 Essential (primary) hypertension: Secondary | ICD-10-CM | POA: Diagnosis not present

## 2018-01-14 DIAGNOSIS — R072 Precordial pain: Secondary | ICD-10-CM | POA: Diagnosis not present

## 2018-01-14 DIAGNOSIS — I2510 Atherosclerotic heart disease of native coronary artery without angina pectoris: Secondary | ICD-10-CM | POA: Diagnosis not present

## 2018-01-15 DIAGNOSIS — I2510 Atherosclerotic heart disease of native coronary artery without angina pectoris: Secondary | ICD-10-CM | POA: Diagnosis not present

## 2018-01-15 DIAGNOSIS — R072 Precordial pain: Secondary | ICD-10-CM | POA: Diagnosis not present

## 2018-01-15 DIAGNOSIS — I10 Essential (primary) hypertension: Secondary | ICD-10-CM | POA: Diagnosis not present

## 2018-01-15 DIAGNOSIS — F17210 Nicotine dependence, cigarettes, uncomplicated: Secondary | ICD-10-CM | POA: Diagnosis not present

## 2018-01-22 DIAGNOSIS — R072 Precordial pain: Secondary | ICD-10-CM | POA: Diagnosis not present

## 2018-01-22 DIAGNOSIS — I2510 Atherosclerotic heart disease of native coronary artery without angina pectoris: Secondary | ICD-10-CM | POA: Diagnosis not present

## 2018-01-22 DIAGNOSIS — F17210 Nicotine dependence, cigarettes, uncomplicated: Secondary | ICD-10-CM | POA: Diagnosis not present

## 2018-01-22 DIAGNOSIS — I10 Essential (primary) hypertension: Secondary | ICD-10-CM | POA: Diagnosis not present

## 2018-03-19 DIAGNOSIS — J00 Acute nasopharyngitis [common cold]: Secondary | ICD-10-CM | POA: Diagnosis not present

## 2018-03-19 DIAGNOSIS — I10 Essential (primary) hypertension: Secondary | ICD-10-CM | POA: Diagnosis not present

## 2018-03-19 DIAGNOSIS — I2510 Atherosclerotic heart disease of native coronary artery without angina pectoris: Secondary | ICD-10-CM | POA: Diagnosis not present

## 2018-03-19 DIAGNOSIS — M545 Low back pain: Secondary | ICD-10-CM | POA: Diagnosis not present

## 2018-04-01 DIAGNOSIS — M545 Low back pain: Secondary | ICD-10-CM | POA: Diagnosis not present

## 2018-04-01 DIAGNOSIS — R0602 Shortness of breath: Secondary | ICD-10-CM | POA: Diagnosis not present

## 2018-04-01 DIAGNOSIS — I10 Essential (primary) hypertension: Secondary | ICD-10-CM | POA: Diagnosis not present

## 2018-04-01 DIAGNOSIS — F17210 Nicotine dependence, cigarettes, uncomplicated: Secondary | ICD-10-CM | POA: Diagnosis not present

## 2018-04-02 ENCOUNTER — Observation Stay (HOSPITAL_COMMUNITY)
Admission: AD | Admit: 2018-04-02 | Discharge: 2018-04-04 | Disposition: A | Discharge status: Home / Self Care | Payer: Medicare Other | Source: Ambulatory Visit | Attending: Cardiovascular Disease | Admitting: Cardiovascular Disease

## 2018-04-02 DIAGNOSIS — I129 Hypertensive chronic kidney disease with stage 1 through stage 4 chronic kidney disease, or unspecified chronic kidney disease: Secondary | ICD-10-CM | POA: Diagnosis not present

## 2018-04-02 DIAGNOSIS — Z880 Allergy status to penicillin: Secondary | ICD-10-CM | POA: Diagnosis not present

## 2018-04-02 DIAGNOSIS — G8929 Other chronic pain: Secondary | ICD-10-CM | POA: Diagnosis not present

## 2018-04-02 DIAGNOSIS — Z7982 Long term (current) use of aspirin: Secondary | ICD-10-CM | POA: Insufficient documentation

## 2018-04-02 DIAGNOSIS — F4002 Agoraphobia without panic disorder: Secondary | ICD-10-CM | POA: Diagnosis not present

## 2018-04-02 DIAGNOSIS — M1990 Unspecified osteoarthritis, unspecified site: Secondary | ICD-10-CM | POA: Insufficient documentation

## 2018-04-02 DIAGNOSIS — G4700 Insomnia, unspecified: Secondary | ICD-10-CM | POA: Diagnosis not present

## 2018-04-02 DIAGNOSIS — R079 Chest pain, unspecified: Secondary | ICD-10-CM | POA: Diagnosis not present

## 2018-04-02 DIAGNOSIS — G4733 Obstructive sleep apnea (adult) (pediatric): Secondary | ICD-10-CM | POA: Diagnosis not present

## 2018-04-02 DIAGNOSIS — F17210 Nicotine dependence, cigarettes, uncomplicated: Secondary | ICD-10-CM | POA: Diagnosis not present

## 2018-04-02 DIAGNOSIS — J45909 Unspecified asthma, uncomplicated: Secondary | ICD-10-CM | POA: Insufficient documentation

## 2018-04-02 DIAGNOSIS — Z882 Allergy status to sulfonamides status: Secondary | ICD-10-CM | POA: Diagnosis not present

## 2018-04-02 DIAGNOSIS — Z8249 Family history of ischemic heart disease and other diseases of the circulatory system: Secondary | ICD-10-CM | POA: Insufficient documentation

## 2018-04-02 DIAGNOSIS — N182 Chronic kidney disease, stage 2 (mild): Secondary | ICD-10-CM | POA: Diagnosis not present

## 2018-04-02 DIAGNOSIS — Z981 Arthrodesis status: Secondary | ICD-10-CM | POA: Insufficient documentation

## 2018-04-02 DIAGNOSIS — K219 Gastro-esophageal reflux disease without esophagitis: Secondary | ICD-10-CM | POA: Diagnosis present

## 2018-04-02 DIAGNOSIS — F329 Major depressive disorder, single episode, unspecified: Secondary | ICD-10-CM | POA: Diagnosis not present

## 2018-04-02 DIAGNOSIS — M549 Dorsalgia, unspecified: Secondary | ICD-10-CM | POA: Diagnosis not present

## 2018-04-02 DIAGNOSIS — K589 Irritable bowel syndrome without diarrhea: Secondary | ICD-10-CM | POA: Diagnosis not present

## 2018-04-02 DIAGNOSIS — R072 Precordial pain: Secondary | ICD-10-CM | POA: Diagnosis not present

## 2018-04-02 DIAGNOSIS — I249 Acute ischemic heart disease, unspecified: Secondary | ICD-10-CM | POA: Diagnosis present

## 2018-04-02 DIAGNOSIS — M545 Low back pain: Secondary | ICD-10-CM | POA: Diagnosis not present

## 2018-04-02 DIAGNOSIS — M797 Fibromyalgia: Secondary | ICD-10-CM | POA: Insufficient documentation

## 2018-04-02 DIAGNOSIS — I2510 Atherosclerotic heart disease of native coronary artery without angina pectoris: Secondary | ICD-10-CM | POA: Diagnosis not present

## 2018-04-02 DIAGNOSIS — I10 Essential (primary) hypertension: Secondary | ICD-10-CM | POA: Diagnosis present

## 2018-04-02 DIAGNOSIS — F419 Anxiety disorder, unspecified: Secondary | ICD-10-CM | POA: Diagnosis not present

## 2018-04-02 HISTORY — DX: Headache: R51

## 2018-04-02 HISTORY — DX: Low back pain, unspecified: M54.50

## 2018-04-02 HISTORY — DX: Personal history of other medical treatment: Z92.89

## 2018-04-02 HISTORY — DX: Personal history of other diseases of the digestive system: Z87.19

## 2018-04-02 HISTORY — DX: Unspecified osteoarthritis, unspecified site: M19.90

## 2018-04-02 HISTORY — DX: Chronic obstructive pulmonary disease, unspecified: J44.9

## 2018-04-02 HISTORY — DX: Pure hypercholesterolemia, unspecified: E78.00

## 2018-04-02 HISTORY — DX: Unspecified asthma, uncomplicated: J45.909

## 2018-04-02 HISTORY — DX: Cardiac murmur, unspecified: R01.1

## 2018-04-02 HISTORY — DX: Obstructive sleep apnea (adult) (pediatric): G47.33

## 2018-04-02 HISTORY — DX: Other chronic pain: G89.29

## 2018-04-02 HISTORY — DX: Low back pain: M54.5

## 2018-04-02 HISTORY — DX: Unspecified chronic bronchitis: J42

## 2018-04-02 LAB — CBC WITH DIFFERENTIAL/PLATELET
BASOS PCT: 0 %
Basophils Absolute: 0.1 10*3/uL (ref 0.0–0.1)
EOS ABS: 0.4 10*3/uL (ref 0.0–0.7)
Eosinophils Relative: 2 %
HCT: 44.4 % (ref 36.0–46.0)
HEMOGLOBIN: 14.7 g/dL (ref 12.0–15.0)
LYMPHS ABS: 3.1 10*3/uL (ref 0.7–4.0)
Lymphocytes Relative: 19 %
MCH: 29.8 pg (ref 26.0–34.0)
MCHC: 33.1 g/dL (ref 30.0–36.0)
MCV: 90.1 fL (ref 78.0–100.0)
Monocytes Absolute: 0.8 10*3/uL (ref 0.1–1.0)
Monocytes Relative: 5 %
NEUTROS ABS: 11.8 10*3/uL — AB (ref 1.7–7.7)
NEUTROS PCT: 74 %
Platelets: 317 10*3/uL (ref 150–400)
RBC: 4.93 MIL/uL (ref 3.87–5.11)
RDW: 13.9 % (ref 11.5–15.5)
WBC: 16.2 10*3/uL — AB (ref 4.0–10.5)

## 2018-04-02 LAB — COMPREHENSIVE METABOLIC PANEL
ALT: 38 U/L (ref 14–54)
ANION GAP: 11 (ref 5–15)
AST: 44 U/L — ABNORMAL HIGH (ref 15–41)
Albumin: 4.3 g/dL (ref 3.5–5.0)
Alkaline Phosphatase: 100 U/L (ref 38–126)
BUN: 22 mg/dL — ABNORMAL HIGH (ref 6–20)
CHLORIDE: 102 mmol/L (ref 101–111)
CO2: 25 mmol/L (ref 22–32)
CREATININE: 1.23 mg/dL — AB (ref 0.44–1.00)
Calcium: 10.3 mg/dL (ref 8.9–10.3)
GFR, EST AFRICAN AMERICAN: 53 mL/min — AB (ref >60)
GFR, EST NON AFRICAN AMERICAN: 45 mL/min — AB (ref >60)
Glucose, Bld: 92 mg/dL (ref 65–99)
POTASSIUM: 4 mmol/L (ref 3.5–5.1)
Sodium: 138 mmol/L (ref 135–145)
Total Bilirubin: 0.5 mg/dL (ref 0.3–1.2)
Total Protein: 7.1 g/dL (ref 6.5–8.1)

## 2018-04-02 LAB — PROTIME-INR
INR: 0.95
PROTHROMBIN TIME: 12.6 s (ref 11.4–15.2)

## 2018-04-02 LAB — APTT: aPTT: 28 seconds (ref 24–36)

## 2018-04-02 LAB — TROPONIN I

## 2018-04-02 MED ORDER — HEPARIN (PORCINE) IN NACL 100-0.45 UNIT/ML-% IJ SOLN
1200.0000 [IU]/h | INTRAMUSCULAR | Status: DC
  Administered 2018-04-02: 700 [IU]/h via INTRAVENOUS
  Administered 2018-04-03 – 2018-04-04 (×4): 1200 [IU]/h via INTRAVENOUS
  Filled 2018-04-02 (×3): qty 250

## 2018-04-02 MED ORDER — SODIUM CHLORIDE 0.9% FLUSH
3.0000 mL | Freq: Two times a day (BID) | INTRAVENOUS | Status: DC
  Administered 2018-04-02 – 2018-04-03 (×3): 3 mL via INTRAVENOUS

## 2018-04-02 MED ORDER — ASPIRIN EC 81 MG PO TBEC
81.0000 mg | DELAYED_RELEASE_TABLET | Freq: Every day | ORAL | Status: DC
  Administered 2018-04-03 – 2018-04-04 (×2): 81 mg via ORAL
  Filled 2018-04-02 (×2): qty 1

## 2018-04-02 MED ORDER — SODIUM CHLORIDE 0.9 % IV SOLN: 250.0000 mL | INTRAVENOUS | Status: DC | PRN

## 2018-04-02 MED ORDER — NITROGLYCERIN 0.4 MG SL SUBL: 0.4000 mg | SUBLINGUAL_TABLET | SUBLINGUAL | Status: DC | PRN

## 2018-04-02 MED ORDER — SODIUM CHLORIDE 0.9% FLUSH: 3.0000 mL | INTRAVENOUS | Status: DC | PRN

## 2018-04-02 MED ORDER — ACETAMINOPHEN 325 MG PO TABS
650.0000 mg | ORAL_TABLET | ORAL | Status: DC | PRN
  Administered 2018-04-03: 650 mg via ORAL
  Filled 2018-04-02: qty 2

## 2018-04-02 MED ORDER — HEPARIN BOLUS VIA INFUSION
3500.0000 [IU] | Freq: Once | INTRAVENOUS | Status: AC
  Administered 2018-04-02: 3500 [IU] via INTRAVENOUS
  Filled 2018-04-02: qty 3500

## 2018-04-02 NOTE — H&P (Signed)
Referring Physician:  TALIN ROZEBOOM is an 62 y.o. female.                       Chief Complaint: Chest pain  HPI: 62 year old female with known catheterization in 08/2016 showing moderate multivessel CAD has recurrent chest pain and exertional dyspnea. She has no fever. She has chronic cough. She has chronic anxiety and depression. She also has hypertension, Lumbar disc disease and GERD.  Past Medical History:  Diagnosis Date  . Agoraphobia without history of panic disorder   . Anxiety   . Asthma   . Coronary artery disease CARDIOLOGIST- DR  Doylene Canard  (VISIT 03-30-11 W/ CHART)   NON-OBS. CAD   (STRESS TEST NOV. 2011  . DDD (degenerative disc disease)   . Depression   . DJD (degenerative joint disease)    JOINT PAIN  . Fibromyalgia   . Frequency of urination   . GERD (gastroesophageal reflux disease) AND HIATIAL HERNIA   CONTROLLED W/ NEXIUM  . Hemorrhoids   . History of gastric ulcer 2004  . Hypertension   . IBS (irritable bowel syndrome)   . Incisional pain s/p interstim implant 1st stage--- 12-07-11    left upper buttock-- pt states dressing clean dry and intact (on 12-08-11)  . Insomnia   . Iron deficiency anemia   . Nocturia   . Non-productive cough   . Numbness in both hands AT TIMES  . OSA on CPAP   . Urge urinary incontinence       Past Surgical History:  Procedure Laterality Date  . CARDIAC CATHETERIZATION  2003  . CARDIAC CATHETERIZATION N/A 08/23/2016   Procedure: Left Heart Cath and Coronary Angiography;  Surgeon: Dixie Dials, MD;  Location: [place] [facility];  Service: Cardiovascular;  Laterality: N/A;  . CERVICAL DISCECTOMY  2002   C4 - 7  . CERVICAL FUSION  2008   C4 - T1  . CHOLECYSTECTOMY  1982  . CYSTO/ HOD/ BLADDER BX  2006  . CYSTO/ HOD/ BLADDER BX/ FULGERATION  06-12-2011  . HYSTEROSCOPY W/D&C  2005  . INTERSTIM IMPLANT PLACEMENT  12/07/2011   Procedure: Barrie Lyme IMPLANT FIRST STAGE;  Surgeon: [name], MD;  Location: [facility];  Service: Urology;  Laterality: Right;  . INTERSTIM IMPLANT PLACEMENT  12/14/2011   Procedure: Barrie Lyme IMPLANT SECOND STAGE;  Surgeon: [name], MD;  Location: [facility];  Service: Urology;  Laterality: Right;  rad tech ok by vickie at [facility]  . LAMINECTOMY  2007   L3 - 5  . RIGHT THUMB SURG.  2001  . TONSILLECTOMY AND ADENOIDECTOMY  1965  . WRIST SURGERY Left    [facility]    Family History  Problem Relation Age of Onset  . Hypertension Father   . Heart disease Father   . Kidney failure Father   . Breast cancer Mother   . Hypertension Mother   . Coronary artery disease Mother   . Alzheimer's disease Mother   . Hypertension Brother   . Hypertension Sister   . Hypertension Daughter    Social History:  reports that she has been smoking.  She has a 60.00 pack-year smoking history. She has never used smokeless tobacco. She reports that she does not drink alcohol or use drugs.  Allergies:  Allergies  Allergen Reactions  . Midazolam Hcl Other (See Comments)    HYPER  . Prednisone Other (See Comments)    HYPER, depressed,  moody  . Statins Other (See Comments)    Causes muscle weakness and joint pain  . Ace Inhibitors Other (See Comments)    DECREASES HEARTRATE  . Amoxicillin Diarrhea  . Nsaids Other (See Comments)    AVOIDS HX GASTRIC ULCER  . Sulfa Antibiotics Rash and Other (See Comments)    JOINT PAIN    Medications Prior to Admission  Medication Sig Dispense Refill  . acetaminophen (TYLENOL) 650 MG CR tablet Take 1,300 mg by mouth 2 (two) times daily as needed for pain.    Marland Kitchen albuterol (PROVENTIL HFA;VENTOLIN HFA) 108 (90 BASE) MCG/ACT inhaler Inhale 2 puffs into the lungs every 6 (six) hours as needed for wheezing or shortness of breath.    Marland Kitchen amLODipine (NORVASC) 5 MG tablet Take 5 mg by mouth daily.  3  . Ascorbic Acid (VITAMIN C) 1000 MG tablet Take 1,000 mg by mouth daily.    Marland Kitchen aspirin EC 81 MG EC tablet Take 1 tablet (81 mg  total) by mouth daily. 30 tablet 3  . buPROPion (WELLBUTRIN XL) 300 MG 24 hr tablet Take 300 mg by mouth daily.    . cholecalciferol (VITAMIN D) 1000 UNITS tablet Take 1,000 Units by mouth daily.     . clonazePAM (KLONOPIN) 1 MG tablet Take 1 mg by mouth 2 (two) times daily.  5  . diltiazem (CARDIZEM CD) 120 MG 24 hr capsule Take 1 capsule (120 mg total) by mouth daily. 30 capsule 3  . DULoxetine (CYMBALTA) 60 MG capsule Take 60 mg by mouth at bedtime.     . fenofibrate (TRICOR) 48 MG tablet Take 48 mg by mouth daily.   6  . ferrous sulfate 325 (65 FE) MG tablet Take 325 mg by mouth daily with breakfast.    . Magnesium 500 MG CAPS Take 500 mg by mouth daily.     . montelukast (SINGULAIR) 10 MG tablet Take 10 mg by mouth at bedtime.    . nitroGLYCERIN (NITROSTAT) 0.4 MG SL tablet Place 1 tablet (0.4 mg total) under the tongue every 5 (five) minutes x 3 doses as needed for chest pain. 10 tablet 0  . pantoprazole (PROTONIX) 40 MG tablet Take 1 tablet (40 mg total) by mouth daily as needed. (Patient taking differently: Take 40 mg by mouth daily as needed (for acid reflux). ) 20 tablet 0  . rosuvastatin (CRESTOR) 10 MG tablet Take 1 tablet (10 mg total) by mouth daily. 90 tablet 3  . SUPER B COMPLEX/C PO Take 1 tablet by mouth daily.       Results for orders placed or performed during the hospital encounter of 04/02/18 (from the past 48 hour(s))  CBC WITH DIFFERENTIAL     Status: Abnormal   Collection Time: 04/02/18  6:39 PM  Result Value Ref Range   WBC 16.2 (H) 4.0 - 10.5 K/uL   RBC 4.93 3.87 - 5.11 MIL/uL   Hemoglobin 14.7 12.0 - 15.0 g/dL   HCT 44.4 36.0 - 46.0 %   MCV 90.1 78.0 - 100.0 fL   MCH 29.8 26.0 - 34.0 pg   MCHC 33.1 30.0 - 36.0 g/dL   RDW 13.9 11.5 - 15.5 %   Platelets 317 150 - 400 K/uL   Neutrophils Relative % 74 %   Neutro Abs 11.8 (H) 1.7 - 7.7 K/uL   Lymphocytes Relative 19 %   Lymphs Abs 3.1 0.7 - 4.0 K/uL   Monocytes Relative 5 %   Monocytes Absolute 0.8 0.1 - 1.0  K/uL   Eosinophils Relative 2 %   Eosinophils Absolute 0.4 0.0 - 0.7 K/uL   Basophils Relative 0 %   Basophils Absolute 0.1 0.0 - 0.1 K/uL    Comment: Performed at [facility] [address], [place], [place] [address]  Protime-INR     Status: None   Collection Time: 04/02/18  6:39 PM  Result Value Ref Range   Prothrombin Time 12.6 11.4 - 15.2 seconds   INR 0.95     Comment: Performed at [facility] [address], [place], [place] [address]  APTT     Status: None   Collection Time: 04/02/18  6:39 PM  Result Value Ref Range   aPTT 28 24 - 36 seconds    Comment: Performed at [facility] [address], [place], [place] [address]   No results found.  Review Of Systems Constitutional: No fever, chills, weight loss or gain. Eyes: No vision change, wears glasses. No discharge or pain. Ears: No hearing loss, No tinnitus. Respiratory: Positive asthma, positive COPD, pneumonias. positive shortness of breath. No hemoptysis. Cardiovascular: Positive chest pain, palpitation, leg edema. Gastrointestinal: No nausea, vomiting, diarrhea, constipation. No GI bleed. No hepatitis. Genitourinary: No dysuria, hematuria, kidney stone. No incontinance. Neurological: Positive headache, no stroke, seizures.  Psychiatry: No psych facility admission for anxiety, depression, suicide. No detox. Skin: No rash. Musculoskeletal: Positive joint pain, fibromyalgia, neck pain, back pain. Lymphadenopathy: No lymphadenopathy. Hematology: No anemia or easy bruising.   Blood pressure 131/79, temperature 98.3 F (36.8 C), temperature source Oral, resp. rate 18, height 4\' 11"  (1.499 m), weight 72.2 kg (159 lb 3.2 oz), last menstrual period 09/06/2005, SpO2 95 %. Body mass index is 32.15 kg/m. General appearance: alert, cooperative, appears stated age and no distress Head: Normocephalic, atraumatic. Eyes: Blue eyes, pink conjunctiva, corneas clear. PERRL, EOM's intact. Neck: No adenopathy,  no carotid bruit, no JVD, supple, symmetrical, trachea midline and thyroid not enlarged. Resp: Clear to auscultation bilaterally. Cardio: Regular rate and rhythm, S1, S2 normal, II/VI systolic murmur, no click, rub or gallop GI: Soft, non-tender; bowel sounds normal; no organomegaly. Extremities: Trace edema, no cyanosis or clubbing. Skin: Warm and dry.  Neurologic: Alert and oriented X 3, normal strength. Normal coordination and gait.  Assessment/Plan Chest pain CAD Hypertension Chronic back pain Anxiety GERD  Nuclear stress test in AM if MI ruled out.  Birdie Riddle, MD  04/02/2018, 7:21 PM

## 2018-04-02 NOTE — Progress Notes (Signed)
ANTICOAGULATION CONSULT NOTE - Initial Consult  Pharmacy Consult for heparin Indication: chest pain/ACS  Allergies  Allergen Reactions  . Midazolam Hcl Other (See Comments)    HYPER  . Prednisone Other (See Comments)    HYPER, depressed, moody  . Statins Other (See Comments)    Causes muscle weakness and joint pain  . Ace Inhibitors Other (See Comments)    DECREASES HEARTRATE  . Amoxicillin Diarrhea  . Nsaids Other (See Comments)    AVOIDS HX GASTRIC ULCER  . Sulfa Antibiotics Rash and Other (See Comments)    JOINT PAIN    Patient Measurements: Height: 4\' 11"  (149.9 cm) Weight: 159 lb 3.2 oz (72.2 kg) IBW/kg (Calculated) : 43.2 Heparin Dosing Weight: 60 kg  Vital Signs: Temp: 98.3 F (36.8 C) (04/16 1804) Temp Source: Oral (04/16 1804) BP: 131/79 (04/16 1804)  Labs: No results for input(s): HGB, HCT, PLT, APTT, LABPROT, INR, HEPARINUNFRC, HEPRLOWMOCWT, CREATININE, CKTOTAL, CKMB, TROPONINI in the last 72 hours.  CrCl cannot be calculated (Patient's most recent lab result is older than the maximum 21 days allowed.).   Medical History: Past Medical History:  Diagnosis Date  . Agoraphobia without history of panic disorder   . Anxiety   . Asthma   . Coronary artery disease CARDIOLOGIST- DR  Doylene Canard  (VISIT 03-30-11 W/ CHART)   NON-OBS. CAD   (STRESS TEST NOV. 2011  . DDD (degenerative disc disease)   . Depression   . DJD (degenerative joint disease)    JOINT PAIN  . Fibromyalgia   . Frequency of urination   . GERD (gastroesophageal reflux disease) AND HIATIAL HERNIA   CONTROLLED W/ NEXIUM  . Hemorrhoids   . History of gastric ulcer 2004  . Hypertension   . IBS (irritable bowel syndrome)   . Incisional pain s/p interstim implant 1st stage--- 12-07-11    left upper buttock-- pt states dressing clean dry and intact (on 12-08-11)  . Insomnia   . Iron deficiency anemia   . Nocturia   . Non-productive cough   . Numbness in both hands AT TIMES  . OSA on CPAP    . Urge urinary incontinence     Assessment: 62 yo F presents on 4/16. Pharmacy consulted to start heparin for ACS. No anticoag PTA. Labs ordered.  Goal of Therapy:  Heparin level 0.3-0.7 units/ml Monitor platelets by anticoagulation protocol: Yes    Plan:  Give heparin 3,500 unit bolus Start heparin gtt at 700 units/hr Check 6 hr heparin level Monitor daily heparin level, CBC, s/s of bleed   Elenor Quinones, PharmD, BCPS Clinical Pharmacist Pager [phone] 04/02/2018 6:22 PM

## 2018-04-02 NOTE — Plan of Care (Signed)
  Problem: Activity: Goal: Risk for activity intolerance will decrease Outcome: Progressing   Problem: Safety: Goal: Ability to remain free from injury will improve Outcome: Progressing   

## 2018-04-03 ENCOUNTER — Ambulatory Visit: Payer: Medicare Other | Admitting: Cardiology

## 2018-04-03 ENCOUNTER — Observation Stay (HOSPITAL_COMMUNITY): Payer: Medicare Other

## 2018-04-03 ENCOUNTER — Other Ambulatory Visit: Payer: Self-pay

## 2018-04-03 ENCOUNTER — Encounter (HOSPITAL_COMMUNITY): Payer: Self-pay | Admitting: General Practice

## 2018-04-03 DIAGNOSIS — R072 Precordial pain: Secondary | ICD-10-CM | POA: Diagnosis not present

## 2018-04-03 DIAGNOSIS — I249 Acute ischemic heart disease, unspecified: Secondary | ICD-10-CM | POA: Diagnosis not present

## 2018-04-03 DIAGNOSIS — I129 Hypertensive chronic kidney disease with stage 1 through stage 4 chronic kidney disease, or unspecified chronic kidney disease: Secondary | ICD-10-CM | POA: Diagnosis not present

## 2018-04-03 DIAGNOSIS — F419 Anxiety disorder, unspecified: Secondary | ICD-10-CM | POA: Diagnosis not present

## 2018-04-03 DIAGNOSIS — K219 Gastro-esophageal reflux disease without esophagitis: Secondary | ICD-10-CM | POA: Diagnosis not present

## 2018-04-03 DIAGNOSIS — N182 Chronic kidney disease, stage 2 (mild): Secondary | ICD-10-CM | POA: Diagnosis not present

## 2018-04-03 DIAGNOSIS — I2510 Atherosclerotic heart disease of native coronary artery without angina pectoris: Secondary | ICD-10-CM | POA: Diagnosis not present

## 2018-04-03 DIAGNOSIS — M545 Low back pain: Secondary | ICD-10-CM | POA: Diagnosis not present

## 2018-04-03 DIAGNOSIS — I10 Essential (primary) hypertension: Secondary | ICD-10-CM | POA: Diagnosis not present

## 2018-04-03 DIAGNOSIS — R079 Chest pain, unspecified: Secondary | ICD-10-CM | POA: Diagnosis not present

## 2018-04-03 LAB — BASIC METABOLIC PANEL
ANION GAP: 12 (ref 5–15)
Anion gap: 10 (ref 5–15)
BUN: 18 mg/dL (ref 6–20)
BUN: 17 mg/dL (ref 6–20)
CHLORIDE: 105 mmol/L (ref 101–111)
CHLORIDE: 101 mmol/L (ref 101–111)
CO2: 25 mmol/L (ref 22–32)
CO2: 24 mmol/L (ref 22–32)
Calcium: 9.4 mg/dL (ref 8.9–10.3)
Calcium: 9.7 mg/dL (ref 8.9–10.3)
Creatinine, Ser: 1.06 mg/dL — ABNORMAL HIGH (ref 0.44–1.00)
Creatinine, Ser: 1.18 mg/dL — ABNORMAL HIGH (ref 0.44–1.00)
GFR calc Af Amer: 55 mL/min — ABNORMAL LOW (ref >60)
GFR calc non Af Amer: 54 mL/min — ABNORMAL LOW (ref >60)
GFR, EST NON AFRICAN AMERICAN: 48 mL/min — AB (ref >60)
GLUCOSE: 110 mg/dL — AB (ref 65–99)
Glucose, Bld: 128 mg/dL — ABNORMAL HIGH (ref 65–99)
POTASSIUM: 3.8 mmol/L (ref 3.5–5.1)
Potassium: 4.1 mmol/L (ref 3.5–5.1)
Sodium: 140 mmol/L (ref 135–145)
Sodium: 137 mmol/L (ref 135–145)

## 2018-04-03 LAB — HEPARIN LEVEL (UNFRACTIONATED)
HEPARIN UNFRACTIONATED: 0.4 [IU]/mL (ref 0.30–0.70)
Heparin Unfractionated: 0.1 IU/mL — ABNORMAL LOW (ref 0.30–0.70)
Heparin Unfractionated: <0.1 IU/mL — ABNORMAL LOW (ref 0.30–0.70)

## 2018-04-03 LAB — CBC
HEMATOCRIT: 44.8 % (ref 36.0–46.0)
Hemoglobin: 14.3 g/dL (ref 12.0–15.0)
MCH: 29.2 pg (ref 26.0–34.0)
MCHC: 31.9 g/dL (ref 30.0–36.0)
MCV: 91.4 fL (ref 78.0–100.0)
PLATELETS: 283 10*3/uL (ref 150–400)
RBC: 4.9 MIL/uL (ref 3.87–5.11)
RDW: 13.8 % (ref 11.5–15.5)
WBC: 15.1 10*3/uL — ABNORMAL HIGH (ref 4.0–10.5)

## 2018-04-03 LAB — LIPID PANEL
CHOL/HDL RATIO: 4.2 ratio
Cholesterol: 165 mg/dL (ref 0–200)
HDL: 39 mg/dL — AB (ref >40)
LDL Cholesterol: 58 mg/dL (ref 0–99)
TRIGLYCERIDES: 339 mg/dL — AB (ref <150)
VLDL: 68 mg/dL — AB (ref 0–40)

## 2018-04-03 LAB — TROPONIN I: Troponin I: <0.03 ng/mL (ref <0.03)

## 2018-04-03 LAB — HIV ANTIBODY (ROUTINE TESTING W REFLEX): HIV SCREEN 4TH GENERATION: NONREACTIVE

## 2018-04-03 MED ORDER — ALBUTEROL SULFATE (2.5 MG/3ML) 0.083% IN NEBU: 2.5000 mg | INHALATION_SOLUTION | Freq: Four times a day (QID) | RESPIRATORY_TRACT | Status: DC | PRN

## 2018-04-03 MED ORDER — REGADENOSON 0.4 MG/5ML IV SOLN
INTRAVENOUS | Status: AC
  Administered 2018-04-03: 0.4 mg
  Filled 2018-04-03: qty 5

## 2018-04-03 MED ORDER — DILTIAZEM HCL 60 MG PO TABS
60.0000 mg | ORAL_TABLET | Freq: Two times a day (BID) | ORAL | Status: DC
  Administered 2018-04-03 – 2018-04-04 (×2): 60 mg via ORAL
  Filled 2018-04-03 (×2): qty 1

## 2018-04-03 MED ORDER — BUPROPION HCL ER (XL) 300 MG PO TB24
300.0000 mg | ORAL_TABLET | Freq: Every day | ORAL | Status: DC
  Administered 2018-04-03 – 2018-04-04 (×2): 300 mg via ORAL
  Filled 2018-04-03 (×2): qty 1

## 2018-04-03 MED ORDER — HEPARIN BOLUS VIA INFUSION
2000.0000 [IU] | Freq: Once | INTRAVENOUS | Status: AC
  Administered 2018-04-03: 2000 [IU] via INTRAVENOUS
  Filled 2018-04-03: qty 2000

## 2018-04-03 MED ORDER — DULOXETINE HCL 60 MG PO CPEP
60.0000 mg | ORAL_CAPSULE | Freq: Every day | ORAL | Status: DC
  Administered 2018-04-03: 60 mg via ORAL
  Filled 2018-04-03: qty 1

## 2018-04-03 MED ORDER — ISOSORBIDE MONONITRATE ER 30 MG PO TB24
30.0000 mg | ORAL_TABLET | Freq: Every day | ORAL | Status: DC
  Administered 2018-04-03 – 2018-04-04 (×2): 30 mg via ORAL
  Filled 2018-04-03 (×2): qty 1

## 2018-04-03 MED ORDER — VITAMIN D 1000 UNITS PO TABS
1000.0000 [IU] | ORAL_TABLET | Freq: Every day | ORAL | Status: DC
  Administered 2018-04-03 – 2018-04-04 (×2): 1000 [IU] via ORAL
  Filled 2018-04-03 (×2): qty 1

## 2018-04-03 MED ORDER — VITAMIN C 500 MG PO TABS
1000.0000 mg | ORAL_TABLET | Freq: Every day | ORAL | Status: DC
  Administered 2018-04-03 – 2018-04-04 (×2): 1000 mg via ORAL
  Filled 2018-04-03 (×2): qty 2

## 2018-04-03 MED ORDER — PANTOPRAZOLE SODIUM 40 MG PO TBEC
40.0000 mg | DELAYED_RELEASE_TABLET | Freq: Every day | ORAL | Status: DC
  Administered 2018-04-03 – 2018-04-04 (×2): 40 mg via ORAL
  Filled 2018-04-03 (×2): qty 1

## 2018-04-03 MED ORDER — SODIUM CHLORIDE 0.9 % IV SOLN: INTRAVENOUS | Status: AC

## 2018-04-03 MED ORDER — HYDRALAZINE HCL 25 MG PO TABS
25.0000 mg | ORAL_TABLET | Freq: Two times a day (BID) | ORAL | Status: DC
  Administered 2018-04-03 – 2018-04-04 (×2): 25 mg via ORAL
  Filled 2018-04-03 (×2): qty 1

## 2018-04-03 MED ORDER — FERROUS SULFATE 325 (65 FE) MG PO TABS
325.0000 mg | ORAL_TABLET | Freq: Every day | ORAL | Status: DC
  Administered 2018-04-04: 325 mg via ORAL
  Filled 2018-04-03: qty 1

## 2018-04-03 MED ORDER — FENOFIBRATE 54 MG PO TABS
54.0000 mg | ORAL_TABLET | Freq: Every day | ORAL | Status: DC
  Administered 2018-04-03 – 2018-04-04 (×2): 54 mg via ORAL
  Filled 2018-04-03 (×2): qty 1

## 2018-04-03 MED ORDER — TECHNETIUM TC 99M TETROFOSMIN IV KIT
10.0000 | PACK | Freq: Once | INTRAVENOUS | Status: AC | PRN
  Administered 2018-04-03: 10 via INTRAVENOUS

## 2018-04-03 MED ORDER — CLONAZEPAM 0.5 MG PO TABS
1.0000 mg | ORAL_TABLET | Freq: Two times a day (BID) | ORAL | Status: DC
  Administered 2018-04-03 – 2018-04-04 (×4): 1 mg via ORAL
  Filled 2018-04-03 (×4): qty 2

## 2018-04-03 MED ORDER — SODIUM CHLORIDE 0.9 % IV SOLN: INTRAVENOUS | Status: DC

## 2018-04-03 MED ORDER — MAGNESIUM OXIDE 400 (241.3 MG) MG PO TABS
400.0000 mg | ORAL_TABLET | Freq: Every day | ORAL | Status: DC
  Administered 2018-04-03 – 2018-04-04 (×2): 400 mg via ORAL
  Filled 2018-04-03 (×2): qty 1

## 2018-04-03 MED ORDER — ROSUVASTATIN CALCIUM 10 MG PO TABS
10.0000 mg | ORAL_TABLET | Freq: Every day | ORAL | Status: DC
  Administered 2018-04-03 – 2018-04-04 (×2): 10 mg via ORAL
  Filled 2018-04-03 (×2): qty 1

## 2018-04-03 MED ORDER — MONTELUKAST SODIUM 10 MG PO TABS
10.0000 mg | ORAL_TABLET | Freq: Every day | ORAL | Status: DC
  Administered 2018-04-03: 10 mg via ORAL
  Filled 2018-04-03: qty 1

## 2018-04-03 MED ORDER — GABAPENTIN 100 MG PO CAPS
100.0000 mg | ORAL_CAPSULE | Freq: Three times a day (TID) | ORAL | Status: DC
  Administered 2018-04-03 – 2018-04-04 (×2): 100 mg via ORAL
  Filled 2018-04-03 (×2): qty 1

## 2018-04-03 NOTE — Progress Notes (Addendum)
Ref: Maurice Small, MD   Subjective:  No reversible ischemia on nuclear stress test. Has stress at home and has been seeing psychiatrist. No chest pain now. VS stable. Creatinine improving.  Objective:  Vital Signs in the last 24 hours: Temp:  [97.6 F (36.4 C)-98.6 F (37 C)] 98.6 F (37 C) (04/17 1744) Pulse Rate:  [72-112] 79 (04/17 1744) Cardiac Rhythm: Heart block (04/17 0700) Resp:  [18-20] 20 (04/17 1744) BP: (131-158)/(58-94) 149/86 (04/17 1744) SpO2:  [92 %-100 %] 92 % (04/17 0826) Weight:  [71.8 kg (158 lb 6.4 oz)-72.2 kg (159 lb 3.2 oz)] 71.8 kg (158 lb 6.4 oz) (04/17 0538)  Physical Exam: BP Readings from Last 1 Encounters:  04/03/18 (!) 149/86     Wt Readings from Last 1 Encounters:  04/03/18 71.8 kg (158 lb 6.4 oz)    Weight change:  Body mass index is 31.99 kg/m. HEENT: [place]/AT, Eyes-Blue, PERL, EOMI, Conjunctiva-Pink, Sclera-Non-icteric Neck: No JVD, No bruit, Trachea midline. Lungs:  Occasional rhonchi, Bilateral. Cardiac:  Regular rhythm, normal S1 and S2, no S3. II/VI systolic murmur. Abdomen:  Soft, non-tender. BS present. Extremities:  No edema present. No cyanosis. No clubbing. CNS: AxOx3, Cranial nerves grossly intact, moves all 4 extremities.  Skin: Warm and dry.   Intake/Output from previous day: 04/16 0701 - 04/17 0700 In: 290 [P.O.:240; I.V.:50] Out: -     Lab Results: BMET    Component Value Date/Time   NA 140 04/03/2018 0732   NA 138 04/02/2018 1839   NA 139 01/02/2017 0300   NA 142 10/11/2015 0905   NA 141 05/27/2015 0906   NA 140 05/26/2014 1039   K 4.1 04/03/2018 0732   K 4.0 04/02/2018 1839   K 3.8 01/02/2017 0300   K 3.4 (L) 10/11/2015 0905   K 4.2 05/27/2015 0906   K 3.5 05/26/2014 1039   CL 105 04/03/2018 0732   CL 102 04/02/2018 1839   CL 104 01/02/2017 0300   CO2 25 04/03/2018 0732   CO2 25 04/02/2018 1839   CO2 26 01/02/2017 0300   CO2 28 10/11/2015 0905   CO2 27 05/27/2015 0906   CO2 29 05/26/2014 1039   GLUCOSE  110 (H) 04/03/2018 0732   GLUCOSE 92 04/02/2018 1839   GLUCOSE 149 (H) 01/02/2017 0300   GLUCOSE 118 10/11/2015 0905   GLUCOSE 116 05/27/2015 0906   GLUCOSE 115 05/26/2014 1039   BUN 18 04/03/2018 0732   BUN 22 (H) 04/02/2018 1839   BUN 14 01/02/2017 0300   BUN 13.2 10/11/2015 0905   BUN 17.4 05/27/2015 0906   BUN 12.9 05/26/2014 1039   CREATININE 1.06 (H) 04/03/2018 0732   CREATININE 1.23 (H) 04/02/2018 1839   CREATININE 0.97 01/02/2017 0300   CREATININE 1.0 10/11/2015 0905   CREATININE 1.2 (H) 05/27/2015 0906   CREATININE 0.9 05/26/2014 1039   CALCIUM 9.4 04/03/2018 0732   CALCIUM 10.3 04/02/2018 1839   CALCIUM 9.1 01/02/2017 0300   CALCIUM 10.0 10/11/2015 0905   CALCIUM 9.5 05/27/2015 0906   CALCIUM 9.4 05/26/2014 1039   GFRNONAA 54 (L) 04/03/2018 0732   GFRNONAA 45 (L) 04/02/2018 1839   GFRNONAA >60 01/02/2017 0300   GFRAA >60 04/03/2018 0732   GFRAA 53 (L) 04/02/2018 1839   GFRAA >60 01/02/2017 0300   CBC    Component Value Date/Time   WBC 15.1 (H) 04/03/2018 0019   RBC 4.90 04/03/2018 0019   HGB 14.3 04/03/2018 0019   HGB 14.9 10/11/2015 0905   HCT  44.8 04/03/2018 0019   HCT 44.5 10/11/2015 0905   PLT 283 04/03/2018 0019   PLT 279 10/11/2015 0905   MCV 91.4 04/03/2018 0019   MCV 88.0 10/11/2015 0905   MCH 29.2 04/03/2018 0019   MCHC 31.9 04/03/2018 0019   RDW 13.8 04/03/2018 0019   RDW 13.0 10/11/2015 0905   LYMPHSABS 3.1 04/02/2018 1839   LYMPHSABS 1.6 10/11/2015 0905   MONOABS 0.8 04/02/2018 1839   MONOABS 0.8 10/11/2015 0905   EOSABS 0.4 04/02/2018 1839   EOSABS 0.3 10/11/2015 0905   BASOSABS 0.1 04/02/2018 1839   BASOSABS 0.1 10/11/2015 0905   HEPATIC Function Panel Recent Labs    07/18/17 1026 10/10/17 1020 04/02/18 1839  PROT 6.8 7.2 7.1   HEMOGLOBIN A1C No components found for: HGA1C,  MPG CARDIAC ENZYMES Lab Results  Component Value Date   TROPONINI <0.03 04/03/2018   TROPONINI <0.03 04/03/2018   TROPONINI <0.03 04/02/2018    BNP No results for input(s): PROBNP in the last 8760 hours. TSH No results for input(s): TSH in the last 8760 hours. CHOLESTEROL Recent Labs    07/18/17 1029 10/10/17 1020 04/03/18 0019  CHOL 162 168 165    Scheduled Meds: . aspirin EC  81 mg Oral Daily  . clonazePAM  1 mg Oral BID  . sodium chloride flush  3 mL Intravenous Q12H   Continuous Infusions: . sodium chloride    . sodium chloride    . heparin 1,200 Units/hr (04/03/18 1756)   PRN Meds:.sodium chloride, acetaminophen, nitroGLYCERIN, sodium chloride flush  Assessment/Plan: Chest pain CAD Hypertension Chronic back pain Anxiety GERD CKD, II  IV fluids overnight. BMET in AM. Increase activity. Discharge in AM   LOS: 0 days    Dixie Dials  MD  04/03/2018, 6:01 PM

## 2018-04-03 NOTE — Progress Notes (Signed)
[place] for heparin Indication: chest pain/ACS  Allergies  Allergen Reactions  . Midazolam Hcl Other (See Comments)    HYPER  . Prednisone Other (See Comments)    HYPER, depressed, moody  . Statins Other (See Comments)    Causes muscle weakness and joint pain  . Ace Inhibitors Other (See Comments)    DECREASES HEARTRATE  . Amoxicillin Diarrhea  . Nsaids Other (See Comments)    AVOIDS HX GASTRIC ULCER  . Sulfa Antibiotics Rash and Other (See Comments)    JOINT PAIN    Patient Measurements: Height: 4\' 11"  (149.9 cm) Weight: 159 lb 3.2 oz (72.2 kg) IBW/kg (Calculated) : 43.2 Heparin Dosing Weight: 60 kg  Vital Signs: Temp: 98.3 F (36.8 C) (04/16 1804) Temp Source: Oral (04/16 1804) BP: 149/72 (04/16 2020) Pulse Rate: 72 (04/16 2020)  Labs: Recent Labs    04/02/18 1839 04/03/18 0019  HGB 14.7 14.3  HCT 44.4 44.8  PLT 317 283  APTT 28  --   LABPROT 12.6  --   INR 0.95  --   HEPARINUNFRC  --  0.10*  CREATININE 1.23*  --   TROPONINI <0.03  --     Estimated Creatinine Clearance: 40 mL/min (A) (by C-G formula based on SCr of 1.23 mg/dL (H)).  Assessment: 62 y.o. female with chest pain for heparin   Goal of Therapy:  Heparin level 0.3-0.7 units/ml Monitor platelets by anticoagulation protocol: Yes    Plan:  Heparin 2000 units IV bolus, then increase heparin 950 units/hr Check heparin level in 6 hours.    Phillis Knack, PharmD, BCPS  04/03/2018 1:21 AM

## 2018-04-03 NOTE — Progress Notes (Signed)
[place] for heparin Indication: chest pain/ACS  Allergies  Allergen Reactions  . Midazolam Hcl Other (See Comments)    HYPER  . Prednisone Other (See Comments)    HYPER, depressed, moody  . Statins Other (See Comments)    Causes muscle weakness and joint pain  . Ace Inhibitors Other (See Comments)    DECREASES HEARTRATE  . Amoxicillin Diarrhea  . Nsaids Other (See Comments)    AVOIDS HX GASTRIC ULCER  . Sulfa Antibiotics Rash and Other (See Comments)    JOINT PAIN    Patient Measurements: Height: 4\' 11"  (149.9 cm) Weight: 158 lb 6.4 oz (71.8 kg) IBW/kg (Calculated) : 43.2 Heparin Dosing Weight: 60 kg  Vital Signs: Temp: 98.1 F (36.7 C) (04/17 0826) Temp Source: Oral (04/17 0826) BP: 152/94 (04/17 1143) Pulse Rate: 112 (04/17 1143)  Labs: Recent Labs    04/02/18 1839 04/03/18 0019 04/03/18 0732 04/03/18 1642  HGB 14.7 14.3  --   --   HCT 44.4 44.8  --   --   PLT 317 283  --   --   APTT 28  --   --   --   LABPROT 12.6  --   --   --   INR 0.95  --   --   --   HEPARINUNFRC  --  0.10* <0.10* 0.40  CREATININE 1.23*  --  1.06*  --   TROPONINI <0.03 <0.03 <0.03  --     Estimated Creatinine Clearance: 46.3 mL/min (A) (by C-G formula based on SCr of 1.06 mg/dL (H)).  Assessment: 62 y.o. female continues on IV heparin for ACS/chest pain. Heparin level undectable this morning (<0.1) on 900 units/hr per pump, (though order was for 950 units/hr).  Prior level was 0.10 on 700 units/hr. No known infusion problems. Patient reports no chest pain.  For stress test today.  Heparin level after rate increase therapeutic.   Goal of Therapy:  Heparin level 0.3-0.7 units/ml Monitor platelets by anticoagulation protocol: Yes    Plan:  Continue heparin gtt at 1,200 units/hr Monitor daily heparin level, CBC, s/s of bleed  Elenor Quinones, PharmD, [facility] Clinical Pharmacist Pager [phone] 04/03/2018 5:24 PM

## 2018-04-03 NOTE — Progress Notes (Signed)
Pt is tearful and states that she ois stressed out at home with daughter and granddaughter. makes her sad.

## 2018-04-03 NOTE — Plan of Care (Signed)
Problem: Education: Goal: Knowledge of General Education information will improve 04/03/2018 1749 by Rolm Baptise, RN Outcome: Progressing 04/03/2018 0827 by Rolm Baptise, RN Outcome: Progressing Goal: Knowledge of General Education information will improve Outcome: Progressing   Problem: Education: Goal: Knowledge of General Education information will improve Outcome: Progressing   Problem: Health Behavior/Discharge Planning: Goal: Ability to manage health-related needs will improve 04/03/2018 1749 by Rolm Baptise, RN Outcome: Progressing 04/03/2018 0827 by Karyl Kinnier D, RN Outcome: Progressing   Problem: Clinical Measurements: Goal: Ability to maintain clinical measurements within normal limits will improve 04/03/2018 1749 by Rolm Baptise, RN Outcome: Progressing 04/03/2018 0827 by Karyl Kinnier D, RN Outcome: Progressing Goal: Will remain free from infection 04/03/2018 1749 by Rolm Baptise, RN Outcome: Progressing 04/03/2018 0827 by Karyl Kinnier D, RN Outcome: Progressing Goal: Diagnostic test results will improve 04/03/2018 1749 by Rolm Baptise, RN Outcome: Progressing 04/03/2018 0827 by Karyl Kinnier D, RN Outcome: Progressing Goal: Respiratory complications will improve 04/03/2018 1749 by Rolm Baptise, RN Outcome: Progressing 04/03/2018 0827 by Karyl Kinnier D, RN Outcome: Progressing Goal: Cardiovascular complication will be avoided 04/03/2018 1749 by Rolm Baptise, RN Outcome: Progressing 04/03/2018 0827 by Karyl Kinnier D, RN Outcome: Progressing   Problem: Clinical Measurements: Goal: Will remain free from infection 04/03/2018 1749 by Rolm Baptise, RN Outcome: Progressing 04/03/2018 0827 by Karyl Kinnier D, RN Outcome: Progressing   Problem: Clinical Measurements: Goal: Diagnostic test results will improve 04/03/2018 1749 by Rolm Baptise, RN Outcome: Progressing 04/03/2018 0827 by Karyl Kinnier D, RN Outcome: Progressing    Problem: Clinical Measurements: Goal: Respiratory complications will improve 04/03/2018 1749 by Rolm Baptise, RN Outcome: Progressing 04/03/2018 0827 by Karyl Kinnier D, RN Outcome: Progressing   Problem: Clinical Measurements: Goal: Cardiovascular complication will be avoided 04/03/2018 1749 by Rolm Baptise, RN Outcome: Progressing 04/03/2018 0827 by Rolm Baptise, RN Outcome: Progressing   Problem: Activity: Goal: Risk for activity intolerance will decrease 04/03/2018 1749 by Rolm Baptise, RN Outcome: Progressing 04/03/2018 0827 by Karyl Kinnier D, RN Outcome: Progressing   Problem: Nutrition: Goal: Adequate nutrition will be maintained 04/03/2018 1749 by Rolm Baptise, RN Outcome: Progressing 04/03/2018 0827 by Karyl Kinnier D, RN Outcome: Progressing   Problem: Coping: Goal: Level of anxiety will decrease 04/03/2018 1749 by Rolm Baptise, RN Outcome: Progressing 04/03/2018 0827 by Karyl Kinnier D, RN Outcome: Progressing   Problem: Elimination: Goal: Will not experience complications related to bowel motility 04/03/2018 1749 by Rolm Baptise, RN Outcome: Progressing 04/03/2018 0827 by Karyl Kinnier D, RN Outcome: Progressing Goal: Will not experience complications related to urinary retention 04/03/2018 1749 by Rolm Baptise, RN Outcome: Progressing 04/03/2018 0827 by Karyl Kinnier D, RN Outcome: Progressing   Problem: Elimination: Goal: Will not experience complications related to urinary retention 04/03/2018 1749 by Rolm Baptise, RN Outcome: Progressing 04/03/2018 0827 by Rolm Baptise, RN Outcome: Progressing   Problem: Pain Managment: Goal: General experience of comfort will improve 04/03/2018 1749 by Rolm Baptise, RN Outcome: Progressing 04/03/2018 0827 by Karyl Kinnier D, RN Outcome: Progressing   Problem: Safety: Goal: Ability to remain free from injury will improve 04/03/2018 1749 by Rolm Baptise, RN Outcome:  Progressing 04/03/2018 0827 by Karyl Kinnier D, RN Outcome: Progressing   Problem: Skin Integrity: Goal: Risk for impaired skin integrity will decrease 04/03/2018 1749 by Rolm Baptise, RN Outcome: Progressing 04/03/2018 0827 by Rolm Baptise, RN Outcome: Progressing   Problem: Activity:  Goal: Ability to return to baseline activity level will improve Outcome: Progressing   Problem: Cardiovascular: Goal: Ability to achieve and maintain adequate cardiovascular perfusion will improve Outcome: Progressing Goal: Vascular access site(s) Level 0-1 will be maintained Outcome: Progressing   Problem: Health Behavior/Discharge Planning: Goal: Ability to safely manage health-related needs after discharge will improve Outcome: Progressing

## 2018-04-03 NOTE — Progress Notes (Signed)
[place] for heparin Indication: chest pain/ACS  Allergies  Allergen Reactions  . Midazolam Hcl Other (See Comments)    HYPER  . Prednisone Other (See Comments)    HYPER, depressed, moody  . Statins Other (See Comments)    Causes muscle weakness and joint pain  . Ace Inhibitors Other (See Comments)    DECREASES HEARTRATE  . Amoxicillin Diarrhea  . Nsaids Other (See Comments)    AVOIDS HX GASTRIC ULCER  . Sulfa Antibiotics Rash and Other (See Comments)    JOINT PAIN    Patient Measurements: Height: 4\' 11"  (149.9 cm) Weight: 158 lb 6.4 oz (71.8 kg) IBW/kg (Calculated) : 43.2 Heparin Dosing Weight: 60 kg  Vital Signs: Temp: 98.1 F (36.7 C) (04/17 0826) Temp Source: Oral (04/17 0826) BP: 158/73 (04/17 0826) Pulse Rate: 75 (04/17 0826)  Labs: Recent Labs    04/02/18 1839 04/03/18 0019 04/03/18 0732  HGB 14.7 14.3  --   HCT 44.4 44.8  --   PLT 317 283  --   APTT 28  --   --   LABPROT 12.6  --   --   INR 0.95  --   --   HEPARINUNFRC  --  0.10* <0.10*  CREATININE 1.23*  --  1.06*  TROPONINI <0.03 <0.03 <0.03    Estimated Creatinine Clearance: 46.3 mL/min (A) (by C-G formula based on SCr of 1.06 mg/dL (H)).  Assessment: 62 y.o. female continues on IV heparin for ACS/chest pain. Heparin level undectable this morning (<0.1) on 900 units/hr per pump, (though order was for 950 units/hr).  Prior level was 0.10 on 700 units/hr. No known infusion problems. Patient reports no chest pain.  For stress test today.  Goal of Therapy:  Heparin level 0.3-0.7 units/ml Monitor platelets by anticoagulation protocol: Yes    Plan:   Heparin 2000 units IV x 1  Increase heparin drip to 1200 units/hr  Heparin level ~6 hrs after rate change.  Daily heparin level and CBC while on heparin.  Arty Baumgartner, [place] Pager: [phone] 04/03/2018 10:35 AM

## 2018-04-03 NOTE — Progress Notes (Signed)
Pt is alert and oreinted back from stress test, Talk with [name]. Diet placed.

## 2018-04-04 ENCOUNTER — Other Ambulatory Visit: Payer: Self-pay

## 2018-04-04 DIAGNOSIS — R079 Chest pain, unspecified: Secondary | ICD-10-CM | POA: Diagnosis not present

## 2018-04-04 DIAGNOSIS — N182 Chronic kidney disease, stage 2 (mild): Secondary | ICD-10-CM | POA: Diagnosis not present

## 2018-04-04 DIAGNOSIS — R072 Precordial pain: Secondary | ICD-10-CM | POA: Diagnosis not present

## 2018-04-04 DIAGNOSIS — I2510 Atherosclerotic heart disease of native coronary artery without angina pectoris: Secondary | ICD-10-CM | POA: Diagnosis not present

## 2018-04-04 DIAGNOSIS — I129 Hypertensive chronic kidney disease with stage 1 through stage 4 chronic kidney disease, or unspecified chronic kidney disease: Secondary | ICD-10-CM | POA: Diagnosis not present

## 2018-04-04 DIAGNOSIS — I10 Essential (primary) hypertension: Secondary | ICD-10-CM | POA: Diagnosis not present

## 2018-04-04 DIAGNOSIS — F419 Anxiety disorder, unspecified: Secondary | ICD-10-CM | POA: Diagnosis not present

## 2018-04-04 DIAGNOSIS — M545 Low back pain: Secondary | ICD-10-CM | POA: Diagnosis not present

## 2018-04-04 DIAGNOSIS — K219 Gastro-esophageal reflux disease without esophagitis: Secondary | ICD-10-CM | POA: Diagnosis not present

## 2018-04-04 LAB — BASIC METABOLIC PANEL
ANION GAP: 10 (ref 5–15)
BUN: 15 mg/dL (ref 6–20)
CHLORIDE: 103 mmol/L (ref 101–111)
CO2: 27 mmol/L (ref 22–32)
Calcium: 9.4 mg/dL (ref 8.9–10.3)
Creatinine, Ser: 1.13 mg/dL — ABNORMAL HIGH (ref 0.44–1.00)
GFR calc Af Amer: 58 mL/min — ABNORMAL LOW (ref >60)
GFR, EST NON AFRICAN AMERICAN: 50 mL/min — AB (ref >60)
GLUCOSE: 126 mg/dL — AB (ref 65–99)
POTASSIUM: 4.2 mmol/L (ref 3.5–5.1)
SODIUM: 140 mmol/L (ref 135–145)

## 2018-04-04 LAB — CBC
HEMATOCRIT: 41 % (ref 36.0–46.0)
HEMOGLOBIN: 13 g/dL (ref 12.0–15.0)
MCH: 28.9 pg (ref 26.0–34.0)
MCHC: 31.7 g/dL (ref 30.0–36.0)
MCV: 91.1 fL (ref 78.0–100.0)
PLATELETS: 262 10*3/uL (ref 150–400)
RBC: 4.5 MIL/uL (ref 3.87–5.11)
RDW: 13.5 % (ref 11.5–15.5)
WBC: 13.1 10*3/uL — ABNORMAL HIGH (ref 4.0–10.5)

## 2018-04-04 LAB — HEPARIN LEVEL (UNFRACTIONATED): Heparin Unfractionated: 0.32 IU/mL (ref 0.30–0.70)

## 2018-04-04 NOTE — Consult Note (Signed)
   [facility] CM Inpatient Consult   04/04/2018  [name] [birth year] [id number]  Patient screened for potential [place] [facility] services. Patient is in the [place] of the [place] [facility] services under patient's Medicare  plan.  Patient states she would like follow up with [place] [facility] again.  She states she is dealing with depression issues with regards to her daughter, daughter's girlfriend and her granddaughter to the put she has started thing about cutting herself again.  She states she is not planning to harm herself but the stress is making her think about it. She states she would be willing to have a Education officer, museum work with her in getting some counseling. States,"I talk to my granddaughter's counselor but she's for pediatrics. I just get so upset that I don't know who to contact."   Will follow up and speak with nursing. Spoke with nurse regarding patient's depression. Will have community follow up. Please place a [facility] Care Management consult or For questions contact:   Natividad Brood, RN BSN [facility] Liaison  [phone] business mobile phone Toll free office [phone]

## 2018-04-04 NOTE — Discharge Summary (Signed)
Physician Discharge Summary  Patient ID: [name] MRN: [id number] DOB/AGE: 1956/08/29 62 y.o.  Admit date: 04/02/2018 Discharge date: 04/04/2018  Admission Diagnoses: Chest pain CAD Hypertension Chronic back pain Anxiety GERD  Discharge Diagnoses:  Principal Problem:   Chest pain Active Problems:   Hypertension   CKD, II from above   GERD (gastroesophageal reflux disease)   Coronary artery disease   Anxiety   Chronic back pain  Discharged Condition: fair  Hospital Course: 62 year old female with known CAD has recurrent chest pain with shortness of breath. Patient denies fever or chills. She admits to increased shortness of breath with exertion. Her troponin I levels were normal hence she underwent nuclear stress test which was negative for perfuion defect. Her antianxiety medications were resumed. She had mild improvement in her renal function with IV saline. She was discharged home in stable condition with f/u by me in 1 week and Dr. Justin Mend in 1 month or as arranged.  Consults: cardiology  Significant Diagnostic Studies: labs: Near normal with mildly elevated WBC count. Near normal BMET except creatinine of 1.06 to 1.23  Mg.  NM stress test: Normal wall motion and septal scar without reversible ischemia.  Treatments: cardiac meds: Aspirin, hydralazine, fenofibrate, Isosorbide mononitrate, diltiazem and Rosuvastatin.  Discharge Exam: Blood pressure 133/62, pulse 62, temperature 98.2 F (36.8 C), temperature source Oral, resp. rate 20, height 4\' 11"  (1.499 m), weight 71.4 kg (157 lb 8 oz), last menstrual period 09/06/2005, SpO2 95 %. General appearance: alert, cooperative and appears stated age. Head: Normocephalic, atraumatic. Eyes: Blue eyes, pink conjunctiva, corneas clear. PERRL, EOM's intact.  Neck: No adenopathy, no carotid bruit, no JVD, supple, symmetrical, trachea midline and thyroid not enlarged. Resp: Clear to auscultation bilaterally. Cardio: Regular rate and  rhythm, S1, S2 normal, II/VI systolic murmur, no click, rub or gallop. GI: Soft, non-tender; bowel sounds normal; no organomegaly. Extremities: Trace edema, no cyanosis or clubbing. Skin: Warm and dry.  Neurologic: Alert and oriented X 3, normal strength and tone. Normal coordination and gait.  Disposition: Discharge disposition: 01-Home or Self Care        Allergies as of 04/04/2018      Reactions   Midazolam Hcl Other (See Comments)   HYPER   Prednisone Other (See Comments)   HYPER, depressed, moody   Statins Other (See Comments)   Causes muscle weakness and joint pain   Ace Inhibitors Other (See Comments)   DECREASES HEARTRATE   Amoxicillin Diarrhea   Nsaids Other (See Comments)   AVOIDS HX GASTRIC ULCER   Sulfa Antibiotics Rash, Other (See Comments)   JOINT PAIN      Medication List    TAKE these medications   acetaminophen 650 MG CR tablet Commonly known as:  TYLENOL Take 1,300 mg by mouth 2 (two) times daily as needed for pain.   albuterol 108 (90 Base) MCG/ACT inhaler Commonly known as:  PROVENTIL HFA;VENTOLIN HFA Inhale 2 puffs into the lungs every 6 (six) hours as needed for wheezing or shortness of breath.   aspirin 81 MG EC tablet Take 1 tablet (81 mg total) by mouth daily.   buPROPion 300 MG 24 hr tablet Commonly known as:  WELLBUTRIN XL Take 300 mg by mouth daily.   cholecalciferol 1000 units tablet Commonly known as:  VITAMIN D Take 1,000 Units by mouth daily.   clonazePAM 1 MG tablet Commonly known as:  KLONOPIN Take 1 mg by mouth 2 (two) times daily.   diltiazem 60 MG tablet  Commonly known as:  CARDIZEM Take 60 mg by mouth 2 (two) times daily.   DULoxetine 60 MG capsule Commonly known as:  CYMBALTA Take 60 mg by mouth at bedtime.   fenofibrate 48 MG tablet Commonly known as:  TRICOR Take 48 mg by mouth daily.   ferrous sulfate 325 (65 FE) MG tablet Take 325 mg by mouth daily with breakfast.   gabapentin 100 MG capsule Commonly  known as:  NEURONTIN Take 100 mg by mouth 3 (three) times daily.   hydrALAZINE 25 MG tablet Commonly known as:  APRESOLINE Take 25 mg by mouth 2 (two) times daily.   isosorbide mononitrate 30 MG 24 hr tablet Commonly known as:  IMDUR Take 30 mg by mouth daily.   Magnesium 500 MG Caps Take 500 mg by mouth daily.   montelukast 10 MG tablet Commonly known as:  SINGULAIR Take 10 mg by mouth at bedtime.   nitroGLYCERIN 0.4 MG SL tablet Commonly known as:  NITROSTAT Place 1 tablet (0.4 mg total) under the tongue every 5 (five) minutes x 3 doses as needed for chest pain.   pantoprazole 40 MG tablet Commonly known as:  PROTONIX Take 1 tablet (40 mg total) by mouth daily as needed. What changed:  when to take this   rosuvastatin 10 MG tablet Commonly known as:  CRESTOR Take 1 tablet (10 mg total) by mouth daily.   SUPER B COMPLEX/C PO Take 1 tablet by mouth daily.   vitamin C 1000 MG tablet Take 1,000 mg by mouth daily.      Follow-up Information    Maurice Small, MD. Schedule an appointment as soon as possible for a visit in 1 month(s).   Specialty:  Family Medicine Contact information: [address] [place] [address] [phone]        Dixie Dials, MD. Schedule an appointment as soon as possible for a visit in 1 week(s).   Specialty:  Cardiology Contact information: [place] [address] [phone]           Signed: Birdie Riddle 04/04/2018, 3:21 PM

## 2018-04-04 NOTE — Progress Notes (Signed)
[place] for Heparin Indication: chest pain/ACS  Allergies  Allergen Reactions  . Midazolam Hcl Other (See Comments)    HYPER  . Prednisone Other (See Comments)    HYPER, depressed, moody  . Statins Other (See Comments)    Causes muscle weakness and joint pain  . Ace Inhibitors Other (See Comments)    DECREASES HEARTRATE  . Amoxicillin Diarrhea  . Nsaids Other (See Comments)    AVOIDS HX GASTRIC ULCER  . Sulfa Antibiotics Rash and Other (See Comments)    JOINT PAIN    Patient Measurements: Height: 4\' 11"  (149.9 cm) Weight: 157 lb 8 oz (71.4 kg)(scale b) IBW/kg (Calculated) : 43.2 Heparin Dosing Weight: 60 kg  Vital Signs: Temp: 98.1 F (36.7 C) (04/18 0800) Temp Source: Oral (04/18 0800) BP: 133/62 (04/18 1300) Pulse Rate: 62 (04/18 1300)  Labs: Recent Labs    04/02/18 1839  04/03/18 0019 04/03/18 0732 04/03/18 1642 04/03/18 1857 04/04/18 0441 04/04/18 0838  HGB 14.7  --  14.3  --   --   --  13.0  --   HCT 44.4  --  44.8  --   --   --  41.0  --   PLT 317  --  283  --   --   --  262  --   APTT 28  --   --   --   --   --   --   --   LABPROT 12.6  --   --   --   --   --   --   --   INR 0.95  --   --   --   --   --   --   --   HEPARINUNFRC  --    < > 0.10* <0.10* 0.40  --  0.32  --   CREATININE 1.23*  --   --  1.06*  --  1.18*  --  1.13*  TROPONINI <0.03  --  <0.03 <0.03  --   --   --   --    < > = values in this interval not displayed.    Estimated Creatinine Clearance: 43.3 mL/min (A) (by C-G formula based on SCr of 1.13 mg/dL (H)).  Assessment: 62 y.o. female continues on IV heparin for ACS/chest pain.   Heparin level is low therapeutic (0.32) today on 1200 units/hr. Stress test done 4/17. Noted plan for discharge today.  Goal of Therapy:  Heparin level 0.3-0.7 units/ml Monitor platelets by anticoagulation protocol: Yes    Plan:   Continue heparin drip at 1200 units/hr  Daily heparin level and CBC while on  heparin.  Expect IV heparin to stop soon.  Arty Baumgartner,  Pager: [phone] 04/04/2018 1:12 PM

## 2018-04-04 NOTE — Plan of Care (Signed)
  Problem: Education: Goal: Knowledge of General Education information will improve Outcome: Progressing   Problem: Health Behavior/Discharge Planning: Goal: Ability to manage health-related needs will improve Outcome: Progressing   Problem: Clinical Measurements: Goal: Ability to maintain clinical measurements within normal limits will improve Outcome: Progressing Goal: Will remain free from infection Outcome: Progressing Goal: Diagnostic test results will improve Outcome: Progressing Goal: Respiratory complications will improve Outcome: Progressing Goal: Cardiovascular complication will be avoided Outcome: Progressing   Problem: Clinical Measurements: Goal: Will remain free from infection Outcome: Progressing   Problem: Clinical Measurements: Goal: Will remain free from infection Outcome: Progressing   Problem: Clinical Measurements: Goal: Respiratory complications will improve Outcome: Progressing   Problem: Clinical Measurements: Goal: Cardiovascular complication will be avoided Outcome: Progressing

## 2018-04-10 ENCOUNTER — Encounter: Payer: Self-pay | Admitting: *Deleted

## 2018-04-10 ENCOUNTER — Other Ambulatory Visit: Payer: Self-pay | Admitting: *Deleted

## 2018-04-10 NOTE — Patient Outreach (Signed)
[place] [facility]) Care Management  04/10/2018  [name] [birth year] [id number]   [facility] CSW was able to make initial contact with patient/family today.  CSW obtained HIPAA compliant identifiers from patient, which included patient's name, address and date of birth. CSW introduced self, explained role and types of services provided through [place] [facility] ([place] [facility]).  Pt reports remembering [facility] RNCM, Estill Bamberg, who has worked with her in the past. CSW  explained the reason for the call; to assist pt with depression and support.  Pt shared with CSW that she has had her daughter, granddaughter and daughters girlfriend living with her.  She reports a multitude of conflicts and issues; including "feeling left out", not agreeing with the disciplining of her granddaughter, and household expenses and the lack of support she feels that she has. Pt denies SI/HI and does admit to being a "cutter" in the past.    CSW listened and validated her feelings and offered support. CSW encouraged pt to consider counseling again as she had positive results from this in the past.  "I use to see Trey Paula but she doesn't take Medicare anymore. "    She is open to CSW seeking options further with follow up call next week.    CSW provided crisis mental health phone #'s to pt and she reports her friends and faith help her with the family issues, arguments and emotions.   Eduard Clos, MSW, [place] Worker  [place] [phone]

## 2018-04-10 NOTE — Patient Outreach (Signed)
[place] [facility]) Care Management  04/10/2018  [name] 1956/04/27 [id number]   CSW received consult and attempted to reach patient today for initial phone assessment and appointment scheduling. HIPPA compliant  Voicemail message left. CSW will try again later this week if no response received from pt.  CSW will send unsuccessful outreach letter to pt.  Eduard Clos, MSW, [place] Worker  [place] [phone]

## 2018-04-12 ENCOUNTER — Ambulatory Visit: Payer: Self-pay | Admitting: *Deleted

## 2018-04-12 DIAGNOSIS — H43812 Vitreous degeneration, left eye: Secondary | ICD-10-CM | POA: Diagnosis not present

## 2018-04-16 DIAGNOSIS — M545 Low back pain: Secondary | ICD-10-CM | POA: Diagnosis not present

## 2018-04-16 DIAGNOSIS — R0602 Shortness of breath: Secondary | ICD-10-CM | POA: Diagnosis not present

## 2018-04-16 DIAGNOSIS — F17210 Nicotine dependence, cigarettes, uncomplicated: Secondary | ICD-10-CM | POA: Diagnosis not present

## 2018-04-16 DIAGNOSIS — I10 Essential (primary) hypertension: Secondary | ICD-10-CM | POA: Diagnosis not present

## 2018-04-17 ENCOUNTER — Ambulatory Visit: Payer: Self-pay | Admitting: *Deleted

## 2018-04-17 ENCOUNTER — Encounter: Payer: Self-pay | Admitting: *Deleted

## 2018-04-17 ENCOUNTER — Other Ambulatory Visit: Payer: Self-pay | Admitting: *Deleted

## 2018-04-17 NOTE — Patient Outreach (Signed)
[place] [facility]) Care Management  04/17/2018  [name] 09-13-1956 [id number]   CSW attempted to reach pt by phone today for follow up.  A HIPPA compliant voicemail message was left and will send unsuccessful outreach letter. CSW will try to reach pt again in the next 1-3 business days.   Eduard Clos, MSW, [place] Worker  [place] [phone]

## 2018-04-18 ENCOUNTER — Encounter: Payer: Self-pay | Admitting: *Deleted

## 2018-04-18 ENCOUNTER — Ambulatory Visit: Payer: Self-pay | Admitting: *Deleted

## 2018-04-18 ENCOUNTER — Other Ambulatory Visit: Payer: Self-pay | Admitting: *Deleted

## 2018-04-18 NOTE — Patient Outreach (Signed)
[place] [facility]) Care Management  04/18/2018  VANNIE HILGERT [birth year] [id number]   Follow up phone attempt today was unsuccessful. CSW left HIPPA compliant voice message and will await call back. If no response, will call again next week.   Eduard Clos, MSW, [place] Worker  [place] [phone]

## 2018-04-22 ENCOUNTER — Ambulatory Visit: Payer: Self-pay | Admitting: *Deleted

## 2018-04-22 ENCOUNTER — Other Ambulatory Visit: Payer: Self-pay | Admitting: *Deleted

## 2018-04-26 ENCOUNTER — Ambulatory Visit: Payer: Self-pay | Admitting: *Deleted

## 2018-04-26 ENCOUNTER — Other Ambulatory Visit: Payer: Self-pay | Admitting: *Deleted

## 2018-04-26 ENCOUNTER — Encounter: Payer: Self-pay | Admitting: *Deleted

## 2018-04-26 NOTE — Patient Outreach (Signed)
[place] [facility]) Care Management  04/26/2018  [name] [birth year] [id number]   CSW has been unable to reach patient by phone after 3 or more phone outreach attempts and an unsuccessful outreach letter was sent by mail.  CSW attempted  Again today and left a HIPPA compliant voice message. CSW will close referral and will notify PCP and [facility] team of above.  Please re-consult if needs and/or interest by patient arises.   Eduard Clos, MSW, [place] Worker  [place] [phone]

## 2018-04-29 ENCOUNTER — Encounter: Payer: Self-pay | Admitting: *Deleted

## 2018-04-30 ENCOUNTER — Encounter: Payer: Self-pay | Admitting: Cardiology

## 2018-04-30 ENCOUNTER — Ambulatory Visit (INDEPENDENT_AMBULATORY_CARE_PROVIDER_SITE_OTHER): Payer: Medicare Other | Admitting: Cardiology

## 2018-04-30 VITALS — BP 120/70 | HR 67 | Ht 59.0 in | Wt 163.2 lb

## 2018-04-30 DIAGNOSIS — I10 Essential (primary) hypertension: Secondary | ICD-10-CM | POA: Diagnosis not present

## 2018-04-30 DIAGNOSIS — I471 Supraventricular tachycardia: Secondary | ICD-10-CM | POA: Diagnosis not present

## 2018-04-30 DIAGNOSIS — E785 Hyperlipidemia, unspecified: Secondary | ICD-10-CM

## 2018-04-30 DIAGNOSIS — I4891 Unspecified atrial fibrillation: Secondary | ICD-10-CM | POA: Diagnosis not present

## 2018-04-30 DIAGNOSIS — I25118 Atherosclerotic heart disease of native coronary artery with other forms of angina pectoris: Secondary | ICD-10-CM

## 2018-04-30 DIAGNOSIS — G4733 Obstructive sleep apnea (adult) (pediatric): Secondary | ICD-10-CM | POA: Diagnosis not present

## 2018-04-30 MED ORDER — DILTIAZEM HCL ER COATED BEADS 120 MG PO CP24: 120.0000 mg | ORAL_CAPSULE | Freq: Every day | ORAL | 3 refills | Status: DC

## 2018-04-30 NOTE — Progress Notes (Signed)
Cardiology Office Note   Date:  04/30/2018   ID:  [name], DOB 1956/07/03, MRN [id number]  PCP:  Maurice Small, MD  Cardiologist:  Will Meredith Leeds, MD    Chief Complaint  Patient presents with  . Follow-up    SVT/CAD     History of Present Illness: [name] is a 62 y.o. female who presents today for cardiology evaluation.   She has history of coronary artery disease, hypertension, hyperlipidemia. She says that she had a heart catheterization 2003 that showed minimal coronary artery disease. Since then she has had stress tests and echocardiograms yearly. Admitted to the hospital with chest pain and SVT.  She was started on 12.5 mg metoprolol for her SVT. Was also noted to have AF post adenosine but was short lived. Heart catheterization was done which showed coronary artery disease as below. No intervention was performed.   Today, denies symptoms of palpitations, chest pain, shortness of breath, orthopnea, PND, lower extremity edema, claudication, dizziness, presyncope, syncope, bleeding, or neurologic sequela. The patient is tolerating medications without difficulties.  Overall she is feeling fatigued.  She has noted no further episodes of palpitations.  Past Medical History:  Diagnosis Date  . Agoraphobia without history of panic disorder   . Anxiety   . Arthritis    "qwhere" (04/03/2018)  . Childhood asthma   . Chronic bronchitis ([place])   . Chronic lower back pain   . COPD (chronic obstructive pulmonary disease) ([place])   . Coronary artery disease CARDIOLOGIST- DR  Doylene Canard  (VISIT 03-30-11 W/ CHART)   NON-OBS. CAD   (STRESS TEST NOV. 2011  . Daily headache   . DDD (degenerative disc disease)   . Depression   . DJD (degenerative joint disease)    JOINT PAIN  . Fibromyalgia   . Frequency of urination   . GERD (gastroesophageal reflux disease) AND HIATIAL HERNIA   CONTROLLED W/ NEXIUM  . Heart murmur   . Hemorrhoids   . High cholesterol   . History of blood  transfusion    "related to anemia" (04/03/2018)  . History of gastric ulcer 2004  . History of hiatal hernia   . Hypertension   . IBS (irritable bowel syndrome)   . Incisional pain s/p interstim implant 1st stage--- 12-07-11    left upper buttock-- pt states dressing clean dry and intact (on 12-08-11)  . Insomnia   . Iron deficiency anemia   . Nocturia   . Non-productive cough   . Numbness in both hands AT TIMES  . OSA (obstructive sleep apnea)    "suppose to wear mask; I don't" (04/03/2018)  . Urge urinary incontinence    Past Surgical History:  Procedure Laterality Date  . ANTERIOR CERVICAL DECOMP/DISCECTOMY FUSION  2008   C4 - T1 ("screws from 1st OR came out")  . ANTERIOR CERVICAL DECOMP/DISCECTOMY FUSION  2002   C4 - 7  . APPENDECTOMY  1982  . BACK SURGERY    . CARDIAC CATHETERIZATION  2003  . CARDIAC CATHETERIZATION N/A 08/23/2016   Procedure: Left Heart Cath and Coronary Angiography;  Surgeon: Dixie Dials, MD;  Location: [place] [facility];  Service: Cardiovascular;  Laterality: N/A;  . CARPAL TUNNEL RELEASE Right   . CATARACT EXTRACTION W/ INTRAOCULAR LENS  IMPLANT, BILATERAL  2016-2018  . CHOLECYSTECTOMY OPEN  1982  . CYSTO/ HOD/ BLADDER BX  2006  . CYSTO/ HOD/ BLADDER BX/ FULGERATION  06-12-2011  . FINGER SURGERY Right 2001   "replaced worn  cartilage on thumb w/tendons"  . HYSTEROSCOPY W/D&C  2005  . INTERSTIM IMPLANT PLACEMENT  12/07/2011   Procedure: Barrie Lyme IMPLANT FIRST STAGE;  Surgeon: [name], MD;  Location: [facility];  Service: Urology;  Laterality: Right;  . INTERSTIM IMPLANT PLACEMENT  12/14/2011   Procedure: Barrie Lyme IMPLANT SECOND STAGE;  Surgeon: [name], MD;  Location[facility];  Service: Urology;  Laterality: Right;  rad tech ok by vickie at [facility]  . LUMBAR LAMINECTOMY  2007   L3 - 5  . TONSILLECTOMY AND ADENOIDECTOMY  1965  . TUBAL LIGATION    . WRIST SURGERY Left    TFCC ("tendon repair")      Current Outpatient Medications  Medication Sig Dispense Refill  . acetaminophen (TYLENOL) 650 MG CR tablet Take 1,300 mg by mouth 2 (two) times daily as needed for pain.    Marland Kitchen albuterol (PROVENTIL HFA;VENTOLIN HFA) 108 (90 BASE) MCG/ACT inhaler Inhale 2 puffs into the lungs every 6 (six) hours as needed for wheezing or shortness of breath.    . Ascorbic Acid (VITAMIN C) 1000 MG tablet Take 1,000 mg by mouth daily.    Marland Kitchen aspirin EC 81 MG EC tablet Take 1 tablet (81 mg total) by mouth daily. 30 tablet 3  . buPROPion (WELLBUTRIN XL) 300 MG 24 hr tablet Take 300 mg by mouth daily.    . cholecalciferol (VITAMIN D) 1000 UNITS tablet Take 1,000 Units by mouth daily.     . clonazePAM (KLONOPIN) 1 MG tablet Take 1 mg by mouth 2 (two) times daily.  5  . diltiazem (CARDIZEM) 60 MG tablet Take 60 mg by mouth 2 (two) times daily.    . DULoxetine (CYMBALTA) 60 MG capsule Take 60 mg by mouth at bedtime.     . fenofibrate (TRICOR) 48 MG tablet Take 48 mg by mouth daily.   6  . ferrous sulfate 325 (65 FE) MG tablet Take 325 mg by mouth daily with breakfast.    . gabapentin (NEURONTIN) 100 MG capsule Take 100 mg by mouth 3 (three) times daily.    . hydrALAZINE (APRESOLINE) 25 MG tablet Take 25 mg by mouth 2 (two) times daily.    . isosorbide mononitrate (IMDUR) 30 MG 24 hr tablet Take 30 mg by mouth daily.    . Magnesium 500 MG CAPS Take 500 mg by mouth daily.     . montelukast (SINGULAIR) 10 MG tablet Take 10 mg by mouth at bedtime.    . nitroGLYCERIN (NITROSTAT) 0.4 MG SL tablet Place 1 tablet (0.4 mg total) under the tongue every 5 (five) minutes x 3 doses as needed for chest pain. 10 tablet 0  . pantoprazole (PROTONIX) 40 MG tablet Take 1 tablet (40 mg total) by mouth daily as needed. (Patient taking differently: Take 40 mg by mouth daily. ) 20 tablet 0  . rosuvastatin (CRESTOR) 10 MG tablet Take 1 tablet (10 mg total) by mouth daily. 90 tablet 3  . SUPER B COMPLEX/C PO Take 1 tablet by mouth daily.       No current facility-administered medications for this visit.     Allergies:   Midazolam hcl; Prednisone; Statins; Ace inhibitors; Amoxicillin; Nsaids; and Sulfa antibiotics   Social History:  The patient  reports that she has been smoking.  She has a 86.00 pack-year smoking history. She has never used smokeless tobacco. She reports that she drinks alcohol. She reports that she does not use drugs.   Family History:  The patient's family history includes Alzheimer's disease in her mother; Breast cancer in her mother; Coronary artery disease in her mother; Heart disease in her father; Hypertension in her brother, daughter, father, mother, and sister; Kidney failure in her father.    ROS:  Please see the history of present illness.   Otherwise, review of systems is positive for weight change, fatigue, appetite change, chest pain, leg pain, shortness of breath, abdominal pain, depression, anxiety, back pain, muscle pain, joint swelling, balance problems, dizziness.   All other systems are reviewed and negative.   PHYSICAL EXAM: VS:  BP 120/70   Pulse 67   Ht 4\' 11"  (1.499 m)   Wt 163 lb 3.2 oz (74 kg)   LMP 09/06/2005   SpO2 95%   BMI 32.96 kg/m  , BMI Body mass index is 32.96 kg/m. GEN: Well nourished, well developed, in no acute distress  HEENT: normal sinus rhythm, possible anterior infarct, lateral T wave inversions, rate 67 Neck: no JVD, carotid bruits, or masses Cardiac: RRR; no murmurs, rubs, or gallops,no edema  Respiratory:  clear to auscultation bilaterally, normal work of breathing GI: soft, nontender, nondistended, + BS MS: no deformity or atrophy  Skin: warm and dry Neuro:  Strength and sensation are intact Psych: euthymic mood, full affect  EKG:  EKG is ordered today. Personal review of the ekg ordered shows sinus rhythm, lateral T wave inversions  Recent Labs: 04/02/2018: ALT 38 04/04/2018: BUN 15; Creatinine, Ser 1.13; Hemoglobin 13.0; Platelets 262; Potassium 4.2;  Sodium 140    Lipid Panel     Component Value Date/Time   CHOL 165 04/03/2018 0019   CHOL 168 10/10/2017 1020   TRIG 339 (H) 04/03/2018 0019   HDL 39 (L) 04/03/2018 0019   HDL 47 10/10/2017 1020   CHOLHDL 4.2 04/03/2018 0019   VLDL 68 (H) 04/03/2018 0019   LDLCALC 58 04/03/2018 0019   LDLCALC 81 10/10/2017 1020     Wt Readings from Last 3 Encounters:  04/30/18 163 lb 3.2 oz (74 kg)  04/04/18 157 lb 8 oz (71.4 kg)  10/10/17 154 lb 12.8 oz (70.2 kg)      Other studies Reviewed: Additional studies/ records that were reviewed today include: Cardiac cath 08/23/16, TTE 08/24/16   Prox RCA lesion, 45 %stenosed.  Ost LAD to Prox LAD lesion, 25 %stenosed.  Mid LAD to Dist LAD lesion, 40 %stenosed.  Dist LAD lesion, 60 %stenosed.  Ost 1st Mrg lesion, 20 %stenosed.  The left ventricular systolic function is normal.  LV end diastolic pressure is normal.  Ost 2nd Mrg lesion, 90 %stenosed.  - Left ventricle: The cavity size was normal. There was mild   concentric hypertrophy. Systolic function was normal. The   estimated ejection fraction was in the range of 60% to 65%. Wall   motion was normal; there were no regional wall motion   abnormalities. Doppler parameters are consistent with abnormal   left ventricular relaxation (grade 1 diastolic dysfunction). - Mitral valve: Calcified annulus.  Holter 10/03/17 personally reviewed Minimum HR: 51 BPM at 6:04:06 AM Maximum HR: 113 BPM at 4:02:31 PM Average HR: 79 BPM Sinus rhythm 1.4% PACs APCs occasionally associated with palpitations  ASSESSMENT AND PLAN:  1.  Coronary artery disease: Found on left heart catheterization.  Lifestyle modification recommended.  She is currently on optimal therapy.  No changes.    2. Hypertension: Well-controlled.  No changes.  3. Hyperlipidemia: Unable to tolerate statins.  4. 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  She is having some fatigue.  We will start her on  120 mg of diltiazem to take at night and stop her current twice daily diltiazem.  5. Atrial fibrillation: Found after adenosine.  48-hour monitor is without atrial fibrillation.  Not anticoagulated.  6. Sleep apnea: Currently not wearing CPAP.  Encourage compliance  7. Tobacco abuse: Encouraged cessation   Current medicines are reviewed at length with the patient today.   The patient does not have concerns regarding her medicines.  The following changes were made today: Change diltiazem  Labs/ tests ordered today include:  No orders of the defined types were placed in this encounter.    Disposition:   FU with Will Camnitz in 12 months  Signed, Will Meredith Leeds, MD  04/30/2018 11:27 AM     [address] [place] [address] [phone] (office) [phone] (fax)

## 2018-04-30 NOTE — Patient Instructions (Addendum)
Medication Instructions:  Your physician has recommended you make the following change in your medication:  1. STOP Diltiazem 60 mg  2. START Diltiazem 120 mg once daily -- take at nighttime  (you can start this after you finish your current bottle of 60 mg tablets)  Labwork: None ordered  Testing/Procedures: None ordered  Follow-Up: Your physician wants you to follow-up in: 1 year with Dr. Curt Bears.  You will receive a reminder letter in the mail two months in advance. If you don't receive a letter, please call our office to schedule the follow-up appointment.  * If you need a refill on your cardiac medications before your next appointment, please call your pharmacy.   *Please note that any paperwork needing to be filled out by the provider will need to be addressed at the front desk prior to seeing the provider. Please note that any FMLA, disability or other documents regarding health condition is subject to a $25.00 charge that must be received prior to completion of paperwork in the form of a money order or check.  Thank you for choosing [facility]!!    Trinidad Curet, RN [phone]  Any Other Special Instructions Will Be Listed Below (If Applicable).

## 2018-05-06 DIAGNOSIS — R079 Chest pain, unspecified: Secondary | ICD-10-CM | POA: Diagnosis not present

## 2018-05-06 DIAGNOSIS — Z09 Encounter for follow-up examination after completed treatment for conditions other than malignant neoplasm: Secondary | ICD-10-CM | POA: Diagnosis not present

## 2018-05-14 DIAGNOSIS — M5416 Radiculopathy, lumbar region: Secondary | ICD-10-CM | POA: Diagnosis not present

## 2018-05-14 DIAGNOSIS — M545 Low back pain: Secondary | ICD-10-CM | POA: Diagnosis not present

## 2018-05-16 ENCOUNTER — Other Ambulatory Visit: Payer: Self-pay | Admitting: Chiropractic Medicine

## 2018-05-16 DIAGNOSIS — M5416 Radiculopathy, lumbar region: Secondary | ICD-10-CM

## 2018-05-17 DIAGNOSIS — Z1231 Encounter for screening mammogram for malignant neoplasm of breast: Secondary | ICD-10-CM | POA: Diagnosis not present

## 2018-05-21 DIAGNOSIS — F17210 Nicotine dependence, cigarettes, uncomplicated: Secondary | ICD-10-CM | POA: Diagnosis not present

## 2018-05-21 DIAGNOSIS — I10 Essential (primary) hypertension: Secondary | ICD-10-CM | POA: Diagnosis not present

## 2018-05-21 DIAGNOSIS — R0602 Shortness of breath: Secondary | ICD-10-CM | POA: Diagnosis not present

## 2018-05-21 DIAGNOSIS — R072 Precordial pain: Secondary | ICD-10-CM | POA: Diagnosis not present

## 2018-05-25 DIAGNOSIS — M25561 Pain in right knee: Secondary | ICD-10-CM | POA: Diagnosis not present

## 2018-05-25 DIAGNOSIS — M25562 Pain in left knee: Secondary | ICD-10-CM | POA: Diagnosis not present

## 2018-05-27 ENCOUNTER — Ambulatory Visit
Admission: RE | Admit: 2018-05-27 | Discharge: 2018-05-27 | Disposition: A | Discharge status: Home / Self Care | Payer: Medicare Other | Source: Ambulatory Visit | Attending: Chiropractic Medicine | Admitting: Chiropractic Medicine

## 2018-05-27 DIAGNOSIS — M5416 Radiculopathy, lumbar region: Secondary | ICD-10-CM

## 2018-05-27 DIAGNOSIS — M5116 Intervertebral disc disorders with radiculopathy, lumbar region: Secondary | ICD-10-CM | POA: Diagnosis not present

## 2018-06-17 DIAGNOSIS — M4310 Spondylolisthesis, site unspecified: Secondary | ICD-10-CM | POA: Insufficient documentation

## 2018-06-17 DIAGNOSIS — M5136 Other intervertebral disc degeneration, lumbar region: Secondary | ICD-10-CM | POA: Insufficient documentation

## 2018-06-17 DIAGNOSIS — T819XXA Unspecified complication of procedure, initial encounter: Secondary | ICD-10-CM | POA: Insufficient documentation

## 2018-06-18 ENCOUNTER — Other Ambulatory Visit: Payer: Self-pay | Admitting: Orthopedic Surgery

## 2018-06-18 DIAGNOSIS — M4310 Spondylolisthesis, site unspecified: Secondary | ICD-10-CM

## 2018-06-18 DIAGNOSIS — J441 Chronic obstructive pulmonary disease with (acute) exacerbation: Secondary | ICD-10-CM

## 2018-07-01 ENCOUNTER — Other Ambulatory Visit: Payer: Medicare Other

## 2018-07-05 ENCOUNTER — Ambulatory Visit
Admission: RE | Admit: 2018-07-05 | Discharge: 2018-07-05 | Disposition: A | Discharge status: Home / Self Care | Payer: Medicare Other | Source: Ambulatory Visit | Attending: Orthopedic Surgery | Admitting: Orthopedic Surgery

## 2018-07-05 DIAGNOSIS — M4310 Spondylolisthesis, site unspecified: Secondary | ICD-10-CM

## 2018-07-05 DIAGNOSIS — M5126 Other intervertebral disc displacement, lumbar region: Secondary | ICD-10-CM | POA: Diagnosis not present

## 2018-07-05 MED ORDER — IOPAMIDOL (ISOVUE-M 200) INJECTION 41%
15.0000 mL | Freq: Once | INTRAMUSCULAR | Status: AC
  Administered 2018-07-05: 15 mL via INTRATHECAL

## 2018-07-05 MED ORDER — ONDANSETRON HCL 4 MG/2ML IJ SOLN
4.0000 mg | Freq: Once | INTRAMUSCULAR | Status: AC
  Administered 2018-07-05: 4 mg via INTRAMUSCULAR

## 2018-07-05 MED ORDER — DIAZEPAM 5 MG PO TABS
5.0000 mg | ORAL_TABLET | Freq: Once | ORAL | Status: AC
  Administered 2018-07-05: 5 mg via ORAL

## 2018-07-05 MED ORDER — MEPERIDINE HCL 100 MG/ML IJ SOLN
50.0000 mg | Freq: Once | INTRAMUSCULAR | Status: AC
  Administered 2018-07-05: 50 mg via INTRAMUSCULAR

## 2018-07-05 NOTE — Discharge Instructions (Signed)
Myelogram Discharge Instructions  1. Go home and rest quietly for the next 24 hours.  It is important to lie flat for the next 24 hours.  Get up only to go to the restroom.  You may lie in the bed or on a couch on your back, your stomach, your left side or your right side.  You may have one pillow under your head.  You may have pillows between your knees while you are on your side or under your knees while you are on your back.  2. DO NOT drive today.  Recline the seat as far back as it will go, while still wearing your seat belt, on the way home.  3. You may get up to go to the bathroom as needed.  You may sit up for 10 minutes to eat.  You may resume your normal diet and medications unless otherwise indicated.  Drink lots of extra fluids today and tomorrow.  4. The incidence of headache, nausea, or vomiting is about 5% (one in 20 patients).  If you develop a headache, lie flat and drink plenty of fluids until the headache goes away.  Caffeinated beverages may be helpful.  If you develop severe nausea and vomiting or a headache that does not go away with flat bed rest, call [phone].  5. You may resume normal activities after your 24 hours of bed rest is over; however, do not exert yourself strongly or do any heavy lifting tomorrow. If when you get up you have a headache when standing, go back to bed and force fluids for another 24 hours.  6. Call your physician for a follow-up appointment.  The results of your myelogram will be sent directly to your physician by the following day.  7. If you have any questions or if complications develop after you arrive home, please call [phone].  Discharge instructions have been explained to the patient.  The patient, or the person responsible for the patient, fully understands these instructions.   YOU MAY RESTART YOUR CYMBALTA AND WELLBUTRIN TOMORROW 07/06/2018 AT 10:30AM.

## 2018-07-09 ENCOUNTER — Other Ambulatory Visit: Payer: Self-pay | Admitting: Cardiology

## 2018-07-09 DIAGNOSIS — E785 Hyperlipidemia, unspecified: Secondary | ICD-10-CM

## 2018-07-09 NOTE — Telephone Encounter (Signed)
Order Providers   Prescribing Provider Encounter Provider  Constance Haw, MD Constance Haw, MD  Outpatient Medication Detail    Disp Refills Start End   rosuvastatin (CRESTOR) 10 MG tablet 90 tablet 3 08/06/2017 06/19/2018   Sig - Route: Take 1 tablet (10 mg total) by mouth daily. - Oral   Sent to pharmacy as: rosuvastatin (CRESTOR) 10 MG tablet   E-Prescribing Status: Receipt confirmed by pharmacy (08/06/2017 9:06 AM EDT)   Associated Diagnoses   Hyperlipidemia, unspecified hyperlipidemia type     Pharmacy   [id number] - [place], [place] - [address].

## 2018-07-11 DIAGNOSIS — L02411 Cutaneous abscess of right axilla: Secondary | ICD-10-CM | POA: Diagnosis not present

## 2018-07-11 DIAGNOSIS — L03111 Cellulitis of right axilla: Secondary | ICD-10-CM | POA: Diagnosis not present

## 2018-07-13 DIAGNOSIS — L02411 Cutaneous abscess of right axilla: Secondary | ICD-10-CM | POA: Diagnosis not present

## 2018-07-13 DIAGNOSIS — L0291 Cutaneous abscess, unspecified: Secondary | ICD-10-CM | POA: Diagnosis not present

## 2018-07-22 DIAGNOSIS — R072 Precordial pain: Secondary | ICD-10-CM | POA: Diagnosis not present

## 2018-07-22 DIAGNOSIS — I10 Essential (primary) hypertension: Secondary | ICD-10-CM | POA: Diagnosis not present

## 2018-07-22 DIAGNOSIS — R0602 Shortness of breath: Secondary | ICD-10-CM | POA: Diagnosis not present

## 2018-07-22 DIAGNOSIS — M545 Low back pain: Secondary | ICD-10-CM | POA: Diagnosis not present

## 2018-07-29 DIAGNOSIS — L02419 Cutaneous abscess of limb, unspecified: Secondary | ICD-10-CM | POA: Diagnosis not present

## 2018-07-29 DIAGNOSIS — M4310 Spondylolisthesis, site unspecified: Secondary | ICD-10-CM | POA: Diagnosis not present

## 2018-07-29 DIAGNOSIS — M5136 Other intervertebral disc degeneration, lumbar region: Secondary | ICD-10-CM | POA: Diagnosis not present

## 2018-07-29 DIAGNOSIS — M961 Postlaminectomy syndrome, not elsewhere classified: Secondary | ICD-10-CM | POA: Diagnosis not present

## 2018-07-29 DIAGNOSIS — L03119 Cellulitis of unspecified part of limb: Secondary | ICD-10-CM | POA: Diagnosis not present

## 2018-08-26 ENCOUNTER — Other Ambulatory Visit: Payer: Self-pay | Admitting: Cardiology

## 2018-08-26 DIAGNOSIS — E785 Hyperlipidemia, unspecified: Secondary | ICD-10-CM

## 2018-08-27 DIAGNOSIS — M4310 Spondylolisthesis, site unspecified: Secondary | ICD-10-CM | POA: Diagnosis not present

## 2018-09-04 ENCOUNTER — Other Ambulatory Visit: Payer: Self-pay | Admitting: Orthopedic Surgery

## 2018-09-04 DIAGNOSIS — M4310 Spondylolisthesis, site unspecified: Secondary | ICD-10-CM

## 2018-09-10 ENCOUNTER — Other Ambulatory Visit: Payer: Self-pay | Admitting: Cardiovascular Disease

## 2018-09-10 ENCOUNTER — Ambulatory Visit
Admission: RE | Admit: 2018-09-10 | Discharge: 2018-09-10 | Disposition: A | Discharge status: Home / Self Care | Payer: Medicare Other | Source: Ambulatory Visit | Attending: Cardiovascular Disease | Admitting: Cardiovascular Disease

## 2018-09-10 DIAGNOSIS — R053 Chronic cough: Secondary | ICD-10-CM

## 2018-09-10 DIAGNOSIS — R072 Precordial pain: Secondary | ICD-10-CM | POA: Diagnosis not present

## 2018-09-10 DIAGNOSIS — M545 Low back pain: Secondary | ICD-10-CM | POA: Diagnosis not present

## 2018-09-10 DIAGNOSIS — R0602 Shortness of breath: Secondary | ICD-10-CM | POA: Diagnosis not present

## 2018-09-10 DIAGNOSIS — R05 Cough: Secondary | ICD-10-CM | POA: Diagnosis not present

## 2018-09-10 DIAGNOSIS — I10 Essential (primary) hypertension: Secondary | ICD-10-CM | POA: Diagnosis not present

## 2018-09-13 DIAGNOSIS — Z961 Presence of intraocular lens: Secondary | ICD-10-CM | POA: Diagnosis not present

## 2018-09-24 ENCOUNTER — Ambulatory Visit
Admission: RE | Admit: 2018-09-24 | Discharge: 2018-09-24 | Disposition: A | Discharge status: Home / Self Care | Payer: Medicare Other | Source: Ambulatory Visit | Attending: Orthopedic Surgery | Admitting: Orthopedic Surgery

## 2018-09-24 DIAGNOSIS — M4310 Spondylolisthesis, site unspecified: Secondary | ICD-10-CM | POA: Diagnosis not present

## 2018-09-24 MED ORDER — IOPAMIDOL (ISOVUE-M 200) INJECTION 41%
1.0000 mL | Freq: Once | INTRAMUSCULAR | Status: AC
  Administered 2018-09-24: 1 mL

## 2018-09-24 MED ORDER — VANCOMYCIN HCL IN DEXTROSE 1-5 GM/200ML-% IV SOLN
1000.0000 mg | Freq: Once | INTRAVENOUS | Status: AC
  Administered 2018-09-24: 1000 mg via INTRAVENOUS

## 2018-09-24 NOTE — Discharge Instructions (Signed)
Intra-Discal Anesthetic Injection Discharge Instruction Sheet  1. You may resume a regular diet and any medications that you routinely take (including pain medications).  2. No driving day of procedure.  3. Light activity throughout the rest of the day.  Do not do any strenuous work, exercise, bending or lifting.  The day following the procedure, you can resume normal physical activity but you should refrain from exercising or physical therapy for at least three days thereafter.   Please contact our office at [phone] for the following symptoms:  Fever greater than 100 degrees.  Headaches unresolved with medication after 2-3 days. 

## 2018-10-07 DIAGNOSIS — Z23 Encounter for immunization: Secondary | ICD-10-CM | POA: Diagnosis not present

## 2018-10-11 DIAGNOSIS — M5136 Other intervertebral disc degeneration, lumbar region: Secondary | ICD-10-CM | POA: Diagnosis not present

## 2018-10-11 DIAGNOSIS — M4310 Spondylolisthesis, site unspecified: Secondary | ICD-10-CM | POA: Diagnosis not present

## 2018-10-11 DIAGNOSIS — M961 Postlaminectomy syndrome, not elsewhere classified: Secondary | ICD-10-CM | POA: Diagnosis not present

## 2018-10-14 DIAGNOSIS — Z01818 Encounter for other preprocedural examination: Secondary | ICD-10-CM | POA: Diagnosis not present

## 2018-10-18 DIAGNOSIS — M4310 Spondylolisthesis, site unspecified: Secondary | ICD-10-CM | POA: Diagnosis not present

## 2018-10-18 DIAGNOSIS — M961 Postlaminectomy syndrome, not elsewhere classified: Secondary | ICD-10-CM | POA: Diagnosis not present

## 2018-10-21 DIAGNOSIS — I10 Essential (primary) hypertension: Secondary | ICD-10-CM | POA: Diagnosis not present

## 2018-10-21 DIAGNOSIS — R072 Precordial pain: Secondary | ICD-10-CM | POA: Diagnosis not present

## 2018-10-21 DIAGNOSIS — M545 Low back pain: Secondary | ICD-10-CM | POA: Diagnosis not present

## 2018-10-21 DIAGNOSIS — R0602 Shortness of breath: Secondary | ICD-10-CM | POA: Diagnosis not present

## 2018-11-01 DIAGNOSIS — R197 Diarrhea, unspecified: Secondary | ICD-10-CM | POA: Diagnosis not present

## 2018-11-01 DIAGNOSIS — Z1211 Encounter for screening for malignant neoplasm of colon: Secondary | ICD-10-CM | POA: Diagnosis not present

## 2018-11-01 DIAGNOSIS — R7303 Prediabetes: Secondary | ICD-10-CM | POA: Diagnosis not present

## 2018-11-05 DIAGNOSIS — H1032 Unspecified acute conjunctivitis, left eye: Secondary | ICD-10-CM | POA: Diagnosis not present

## 2018-11-16 ENCOUNTER — Other Ambulatory Visit: Payer: Self-pay | Admitting: Cardiology

## 2018-11-16 DIAGNOSIS — E785 Hyperlipidemia, unspecified: Secondary | ICD-10-CM

## 2018-11-18 ENCOUNTER — Ambulatory Visit (HOSPITAL_COMMUNITY): Payer: Self-pay | Admitting: Orthopedic Surgery

## 2018-11-18 DIAGNOSIS — M961 Postlaminectomy syndrome, not elsewhere classified: Secondary | ICD-10-CM | POA: Diagnosis not present

## 2018-11-18 NOTE — Pre-Procedure Instructions (Signed)
[name]  11/18/2018      [id number] [address], [place] [address] Phone: [phone] Fax: [phone]    Your procedure is scheduled on December 11th.  Report to [facility] Admitting at 6:30 A.M.  Call this number if you have problems the morning of surgery:  [phone]   Remember:  Do not eat or drink after midnight.     Take these medicines the morning of surgery with A SIP OF WATER    Tylenol - if needed  Albuterol Inhaler - if needed  Bupropion (Wellbutrin)  Clonazepam (Klonopin)  Gabapentin (Neurontin)  Pantoprazole (Protonix)  Tizanidine (Zanaflex)  7 days prior to surgery STOP taking any Aspirin(unless otherwise instructed by your surgeon), Aleve, Naproxen, Ibuprofen, Motrin, Advil, Goody's, BC's, all herbal medications, fish oil, and all vitamins    Do not wear jewelry, make-up or nail polish.  Do not wear lotions, powders, or perfumes, or deodorant.  Do not shave 48 hours prior to surgery.  Men may shave face and neck.  Do not bring valuables to the hospital.  [facility] is not responsible for any belongings or valuables.   [place]- Preparing For Surgery  Before surgery, you can play an important role. Because skin is not sterile, your skin needs to be as free of germs as possible. You can reduce the number of germs on your skin by washing with CHG (chlorahexidine gluconate) Soap before surgery.  CHG is an antiseptic cleaner which kills germs and bonds with the skin to continue killing germs even after washing.    Oral Hygiene is also important to reduce your risk of infection.  Remember - BRUSH YOUR TEETH THE MORNING OF SURGERY WITH YOUR REGULAR TOOTHPASTE  Please do not use if you have an allergy to CHG or antibacterial soaps. If your skin becomes reddened/irritated stop using the CHG.  Do not shave (including legs and underarms) for at least 48 hours prior to first CHG shower.  It is OK to shave your face.  Please follow these instructions carefully.   1. Shower the NIGHT BEFORE SURGERY and the MORNING OF SURGERY with CHG.   2. If you chose to wash your hair, wash your hair first as usual with your normal shampoo.  3. After you shampoo, rinse your hair and body thoroughly to remove the shampoo.  4. Use CHG as you would any other liquid soap. You can apply CHG directly to the skin and wash gently with a scrungie or a clean washcloth.   5. Apply the CHG Soap to your body ONLY FROM THE NECK DOWN.  Do not use on open wounds or open sores. Avoid contact with your eyes, ears, mouth and genitals (private parts). Wash Face and genitals (private parts)  with your normal soap.  6. Wash thoroughly, paying special attention to the area where your surgery will be performed.  7. Thoroughly rinse your body with warm water from the neck down.  8. DO NOT shower/wash with your normal soap after using and rinsing off the CHG Soap.  9. Pat yourself dry with a CLEAN TOWEL.  10. Wear CLEAN PAJAMAS to bed the night before surgery, wear comfortable clothes the morning of surgery  11. Place CLEAN SHEETS on your bed the night of your first shower and DO NOT SLEEP WITH PETS.  Day of Surgery:  Do not apply any deodorants/lotions.  Please wear clean clothes to the hospital/surgery  center.   Remember to brush your teeth WITH YOUR REGULAR TOOTHPASTE.   Contacts, dentures or bridgework may not be worn into surgery.  Leave your suitcase in the car.  After surgery it may be brought to your room.  For patients admitted to the hospital, discharge time will be determined by your treatment team.  Patients discharged the day of surgery will not be allowed to drive home.   Please read over the following fact sheets that you were given. Coughing and Deep Breathing, MRSA Information and Surgical Site Infection Prevention

## 2018-11-18 NOTE — H&P (Deleted)
  The note originally documented on this encounter has been moved the the encounter in which it belongs.  

## 2018-11-18 NOTE — H&P (Signed)
HPI The patient is here today for a pre-operative History and Physical. They are scheduled for XLIF L3-5, PSFI L3-5. on 11-27-18 with Dr. Rolena Infante at [facility]. patient continues to have significant back buttock and bilateral lower extremity pain right side worse than the left. Despite appropriate conservative management her quality-of-life continues to deteriorate and she would like to move forward with surgery..   Allergies AMOXICILLIN: Diarrhea  BACTRIM: Rash  NSAIDS (NON-STEROIDAL ANTI-INFLAMMATORY DRUG): GI bleed  PREDNISONE: Other  STATINS-HMG-COA REDUCTASE INHIBITORS: Myalgias (muscle pain)  SULFA (SULFONAMIDE ANTIBIOTICS): Rash  VERSED:    Medications Afluria Qd 2019-20  Afluria Quad 2018-2019  buPROPion HCL XL 300 mg 24 hr tablet, extended release clonazePAM 1 mg tablet dilTIAZem CD 120 mg capsule,extended release 24 hr doxycycline hyclate 100 mg tablet doxycycline monohydrate 100 mg capsule DULoxetine 60 mg capsule,delayed release fenofibrate nanocrystallized 48 mg tablet gabapentin 100 mg capsule hydrALAZINE 25 mg tablet isosorbide mononitrate ER 30 mg tablet,extended release 24 hr montelukast 10 mg tablet nitroglycerin 0.4 mg sublingual tablet pantoprazole 40 mg tablet,delayed release rosuvastatin 10 mg tablet tiZANidine 4 mg tablet. tobramycin 0.3 % eye drops Tylenol 8 Hour 650 mg tablet,extended release Ventolin HFA 90 mcg/actuation aerosol inhaler  Problems Reviewed Problems Degenerative spondylolisthesis - Onset: 06/17/2018 - L3-4 Degeneration of lumbar intervertebral disc - Onset: 06/17/2018 Post-laminectomy syndrome - Onset: 06/17/2018 Family History Reviewed Family History Mother - Heart disease Father - Heart disease   - History of calculus of kidney Social History Tobacco Smoking Status: Current every day smoker Smoker (1 1/2 PPD  Surgical History Angioplasty Appendectomy Carpal Tunnel Release Cataract Tonsillectomy Tubal  ligation GYN History  ROS: Most Recent Mammogram: 05/16/2017. Past Medical History Reviewed Past Medical History Anxiety Disorder: Y Depression: Y Fibromyalgia: Y GERD/Reflux: Y Heart Problems: Y High Cholesterol: Y Hypertension: Y Irregular Heartbeat: Y Joint Pain: Y Osteoporosis: Y Pacemaker: Y Sleep apnea: Y Screening None recorded.   Physical Exam Patient is a 62 year old female.  Clinical exam: Edda is a pleasant individual, who appears younger than their stated age. She Is alert and orientated 3. No shortness of breath, chest pain. Abdomen is soft and non-tender, patient has chronic issues with urinary incontinence and has a stimulator in place which unfortunately is no longer functioning. She denies any loss of bowel control, no rebound tenderness. Negative: skin lesions abrasions contusions  Peripheral pulses: 1+ dorsalis pedis/posterior tibialis pulses. Compartment soft and nontender.  Gait pattern: Patient has significant back pain radiating into the right lower extremity causing her to limp.  Assistive devices: No assistive devices for ambulation  Neuro: Positive back buttock and neuropathic right leg pain predominantly L3 and L4 dermatome. No significant S1 nerve pain. Negative straight leg raise test. Negative Babinski test, no clonus, 1+ deep tendon reflexes in the lower extremity bilaterally.  Musculoskeletal: Significant back pain with palpation and range of motion. Well-healed surgical scar from her previous multilevel lumbar decompression. No significant SI joint pain. Pain radiates into both lower extremities right worse than the left. No significant hip, knee, ankle pain with isolated joint range of motion.  Lumbar discogram done on 09/24/18 confirms that L3-4 is a positive pain generator. Intra-discal anesthetic injection provided significant temporary relief of her pain.  CT myelogram performed on 06/29/18 was also reviewed. And grade 1 degenerative  anterolisthesis at L4-5 along with degenerative disc disease L3-4. Previous laminectomy is noted at the L3-5 levels. There is moderate facet hypertrophy worse on the right side at L5-S1 along with the dynamic listhesis at  L4-5.   Assessment / Plan Diagnosis: Tanessa is a very pleasant 62 year old female with ongoing severe debilitating back pain. Pain also radiates into the right lower extremity and with any type of activity will also radiate into the left. Based on her preoperative discogram and CT myelogram I do believe the primary pain generators are the L3-4 and L4-5 level. Initially I felt as though she is also having S1 radicular pain due to the facet hypertrophy and lateral recess stenosis at L5-S1 but clinically she denies any significant S1 radicular pain and she has no focal deficits in the S1 dermatome. For this reason I do not think I need to include this in her surgical plan. Furthermore initially I felt as though we can simply replace the malfunctioning spinal cord stimulator battery. However according to the representative this would also require repositioning the leads in and around her bladder which is technically something I cannot do. For that reason we are not getting move forward with changing the battery.  Surgical plan: Lateral interbody fusion L3-4 and L4-5 to address the spinal stenosis, degenerative disc disease, and degenerative spondylolisthesis. I will then supplement this with posterior pedicle screw fixation L3-5. I have gone over the procedure with the patient and all of her questions were encouraged and addressed. We have also gone over the risks of surgery. We have obtained preoperative medical clearance from her primary care physician.  I also talked about how ongoing nicotine use can lead to nonunion and essentially increase the likelihood of a failed back syndrome.  Risks, benefits of lateral interbody fusion surgery were reviewed with the patient. These include: infection,  bleeding, death, stroke, paralysis, ongoing or worse pain, need for additional surgery, injury to the lumbar plexus resulting in hip flexor weakness and difficulty walking without assistive devices. Adjacent segment degenerative disease, need for additional surgery including fusing other levels, leak of spinal fluid, Nonunion, hardware failure, breakage, or mal-position. Deep venous thrombosis (DVT) requiring additional treatment such as filter, and/or medications. Injury to abdominal contents, loss in bowel and bladder control.  Risks and benefits of posterior fusion surgery were discussed with the patient. These include: Infection, bleeding, death, stroke, paralysis, ongoing or worse pain, need for additional surgery, nonunion, leak of spinal fluid, adjacent segment degeneration requiring additional fusion surgery, Injury to abdominal vessels that can require anterior surgery to stop bleeding. Malposition of the cage and/or pedicle screws that could require additional surgery. Loss of bowel and bladder control. Postoperative hematoma causing neurologic compression that could require urgent or emergent re-operation.  Treatment plan: Plan on moving forward with surgery all in one day on 11/27/18.

## 2018-11-19 ENCOUNTER — Encounter (HOSPITAL_COMMUNITY)
Admission: RE | Admit: 2018-11-19 | Discharge: 2018-11-19 | Disposition: A | Discharge status: Home / Self Care | Payer: Medicare Other | Source: Ambulatory Visit | Attending: Orthopedic Surgery | Admitting: Orthopedic Surgery

## 2018-11-19 ENCOUNTER — Encounter (HOSPITAL_COMMUNITY): Payer: Self-pay

## 2018-11-19 DIAGNOSIS — Z01812 Encounter for preprocedural laboratory examination: Secondary | ICD-10-CM | POA: Diagnosis not present

## 2018-11-19 HISTORY — DX: Dyspnea, unspecified: R06.00

## 2018-11-19 HISTORY — DX: Angina pectoris, unspecified: I20.9

## 2018-11-19 LAB — CBC
HCT: 48.3 % — ABNORMAL HIGH (ref 36.0–46.0)
Hemoglobin: 15.1 g/dL — ABNORMAL HIGH (ref 12.0–15.0)
MCH: 28.6 pg (ref 26.0–34.0)
MCHC: 31.3 g/dL (ref 30.0–36.0)
MCV: 91.5 fL (ref 80.0–100.0)
Platelets: 280 10*3/uL (ref 150–400)
RBC: 5.28 MIL/uL — ABNORMAL HIGH (ref 3.87–5.11)
RDW: 13 % (ref 11.5–15.5)
WBC: 15.2 10*3/uL — ABNORMAL HIGH (ref 4.0–10.5)
nRBC: 0 % (ref 0.0–0.2)

## 2018-11-19 LAB — BASIC METABOLIC PANEL
Anion gap: 11 (ref 5–15)
BUN: 16 mg/dL (ref 8–23)
CO2: 25 mmol/L (ref 22–32)
CREATININE: 1.08 mg/dL — AB (ref 0.44–1.00)
Calcium: 10 mg/dL (ref 8.9–10.3)
Chloride: 104 mmol/L (ref 98–111)
GFR calc Af Amer: >60 mL/min (ref >60)
GFR calc non Af Amer: 54 mL/min — ABNORMAL LOW (ref >60)
Glucose, Bld: 107 mg/dL — ABNORMAL HIGH (ref 70–99)
Potassium: 3.8 mmol/L (ref 3.5–5.1)
Sodium: 140 mmol/L (ref 135–145)

## 2018-11-19 LAB — TYPE AND SCREEN
ABO/RH(D): A NEG
Antibody Screen: NEGATIVE

## 2018-11-19 LAB — SURGICAL PCR SCREEN
MRSA, PCR: POSITIVE — AB
Staphylococcus aureus: POSITIVE — AB

## 2018-11-19 NOTE — Telephone Encounter (Signed)
Received refill for Crestor with note from the pharmacy stating that patient is currently on Crestor 10MG , but would like to try Lipitor because it is cheaper on patients insurance. Pharmacy suggesting Lipitor 20MG . If this is ok with Dr. Curt Meadows, please change Crestor 10MG  RX to Lipitor 20MG .

## 2018-11-19 NOTE — Pre-Procedure Instructions (Addendum)
[name]  11/19/2018      [id number] [address], [place] [address] Phone: [phone] Fax: [phone]    Your procedure is scheduled on December 11th.  Report to [facility] Admitting at 9:20 A.M.  Call this number if you have problems the morning of surgery:  [phone]   Remember:  Do not eat or drink after midnight.     Take these medicines the morning of surgery with A SIP OF WATER    Tylenol - if needed  Albuterol Inhaler - if needed  And bring to hospital  Bupropion (Wellbutrin)  Clonazepam (Klonopin)  Gabapentin (Neurontin)  Pantoprazole (Protonix)               Hydralazine (apresoline)              Nitroglycerine if needed  7 days prior to surgery STOP taking any Aspirin(unless otherwise instructed by your surgeon), Aleve, Naproxen, Ibuprofen, Motrin, Advil, Goody's, BC's, all herbal medications, fish oil, and all vitamins    Do not wear jewelry, make-up or nail polish.  Do not wear lotions, powders, or perfumes, or deodorant.  Do not shave 48 hours prior to surgery.  Men may shave face and neck.  Do not bring valuables to the hospital.  [facility] is not responsible for any belongings or valuables.   [place] [place]- Preparing For Surgery  Before surgery, you can play an important role. Because skin is not sterile, your skin needs to be as free of germs as possible. You can reduce the number of germs on your skin by washing with CHG (chlorahexidine gluconate) Soap before surgery.  CHG is an antiseptic cleaner which kills germs and bonds with the skin to continue killing germs even after washing.    Oral Hygiene is also important to reduce your risk of infection.  Remember - BRUSH YOUR TEETH THE MORNING OF SURGERY WITH YOUR REGULAR TOOTHPASTE  Please do not use if you have an allergy to CHG or antibacterial soaps. If your skin becomes reddened/irritated stop using the CHG.  Do not shave  (including legs and underarms) for at least 48 hours prior to first CHG shower. It is OK to shave your face.  Please follow these instructions carefully.   1. Shower the NIGHT BEFORE SURGERY and the MORNING OF SURGERY with CHG.   2. If you chose to wash your hair, wash your hair first as usual with your normal shampoo.  3. After you shampoo, rinse your hair and body thoroughly to remove the shampoo.  4. Use CHG as you would any other liquid soap. You can apply CHG directly to the skin and wash gently with a scrungie or a clean washcloth.   5. Apply the CHG Soap to your body ONLY FROM THE NECK DOWN.  Do not use on open wounds or open sores. Avoid contact with your eyes, ears, mouth and genitals (private parts). Wash Face and genitals (private parts)  with your normal soap.  6. Wash thoroughly, paying special attention to the area where your surgery will be performed.  7. Thoroughly rinse your body with warm water from the neck down.  8. DO NOT shower/wash with your normal soap after using and rinsing off the CHG Soap.  9. Pat yourself dry with a CLEAN TOWEL.  10. Wear CLEAN PAJAMAS to bed the night before surgery, wear comfortable clothes the morning of surgery  11. Place CLEAN  SHEETS on your bed the night of your first shower and DO NOT SLEEP WITH PETS.  Day of Surgery:  Do not apply any deodorants/lotions.  Please wear clean clothes to the hospital/surgery center.   Remember to brush your teeth WITH YOUR REGULAR TOOTHPASTE.   Contacts, dentures or bridgework may not be worn into surgery.  Leave your suitcase in the car.  After surgery it may be brought to your room.  For patients admitted to the hospital, discharge time will be determined by your treatment team.  Patients discharged the day of surgery will not be allowed to drive home.   Please read over the following fact sheets that you were given. Coughing and Deep Breathing, MRSA Information and Surgical Site Infection  Prevention

## 2018-11-19 NOTE — Progress Notes (Addendum)
PCP: Arbie Cookey Web @ [facility]  Cardiologist: [name] and Dr. Curt Bears  Pt. Stated was instructed to stop aspirin 5 days prior to surgery  Requesting last notes from [name]  Pt. Reporting she has chest pain usually once a week, most of time it's after eating or other times doing nothing. Last time was Sunday night, after eating. Stated it resolved in 10 minutes, she didn't take any nitroglycerine. States [name] aware. Notified Karoline Caldwell, [place], reviewed last stress 03/2018. Stated to get last office notes from [name].  [facility] [address]  Left message at Dr. Rolena Infante' office that an antibiotic was not ordered for surgery and pt. Has a positive mrsa pcr.

## 2018-11-20 NOTE — Progress Notes (Signed)
Anesthesia Chart Review:  Case:  [id number] Date/Time:  11/27/18 1107   Procedure:  XLIF L3-5, posterior spinal fusion L3-5, replace spinal cord stimulator battery (N/A ) - 5.5 hrs for entire procedure   Anesthesia type:  General   Pre-op diagnosis:  Degeneratice disc disease L3-4, post laminectomy syndrome, degenerative slip L4-5   Location:  [facility] OR ROOM 04 / [facility]   Surgeon:  Melina Schools, MD      DISCUSSION: 62 yo female current smoker. Pertinent hx includes HTN, CAD, Recurrent chest pain, HLD, OSA not on CPAP, GERD, COPD, DOE.  Pt was admitted 4/16-4/18/19 for chest pain. Per discharge summary by [name] "62 year old female with known CAD has recurrent chest pain with shortness of breath. Patient denies fever or chills. She admits to increased shortness of breath with exertion. Her troponin I levels were normal hence she underwent nuclear stress test which was negative for perfuion defect. Her antianxiety medications were resumed. She had mild improvement in her renal function with IV saline. She was discharged home in stable condition with f/u by me in 1 week and Dr. Justin Mend in 1 month or as arranged."    Nuclear stress test 04/03/2018 showed EF 60%, fixed defect noted in the septum with paradoxical motion, most compatible with septal scar. No evidence of ischemia. Non invasive risk stratification: Intermediate  Pt follows with Dr. Curt Bears hx of SVT and CAD. Last seen 04/30/2018. Per his note "Coronary artery disease: Found on left heart catheterization. Lifestyle modification recommended. She is currently on optimal therapy. No changes." He recommended 55yr f/u.  Anticipate she can proceed as planned barring acute status change.  VS: BP (!) 156/53   Pulse 70   Temp 36.4 C   Resp 20   Ht 4\' 11"  (1.499 m)   Wt 73.5 kg   LMP 09/06/2005   SpO2 93%   BMI 32.72 kg/m   PROVIDERS: Maurice Small, MD is PCP  Curt Bears, Will MD is EP Cardiologist  Dixie Dials, MD is Cardiologist   LABS:  Labs reviewed: Acceptable for surgery. (all labs ordered are listed, but only abnormal results are displayed)  Labs Reviewed  SURGICAL PCR SCREEN - Abnormal; Notable for the following components:      Result Value   MRSA, PCR POSITIVE (*)    Staphylococcus aureus POSITIVE (*)    All other components within normal limits  BASIC METABOLIC PANEL - Abnormal; Notable for the following components:   Glucose, Bld 107 (*)    Creatinine, Ser 1.08 (*)    GFR calc non Af Amer 54 (*)    All other components within normal limits  CBC - Abnormal; Notable for the following components:   WBC 15.2 (*)    RBC 5.28 (*)    Hemoglobin 15.1 (*)    HCT 48.3 (*)    All other components within normal limits  TYPE AND SCREEN     IMAGES: CHEST - 2 VIEW 09/10/2018  COMPARISON:  01/01/2017  FINDINGS: Cardiac shadows within normal limits. The lungs are well aerated bilaterally. No focal infiltrate or sizable effusion is seen. No bony abnormality is noted. Postsurgical changes are noted in the cervical spine.  IMPRESSION: No active cardiopulmonary disease.  EKG: 04/30/2018: NSR with sinus arrhythmia. Rate 67. Possible anterior infarct, age undetermined.  CV: Nuclear stress 04/03/2018: IMPRESSION: 1. Fixed defect noted in the septum with paradoxical motion, most compatible with septal scar. No evidence of ischemia.  2. Otherwise normal wall motion.  3.  Left ventricular ejection fraction 60%  4. Non invasive risk stratification*: Intermediate  Holter Monitor 09/14/2016: Minimum HR: 51 BPM at 6:04:06 AM Maximum HR: 113 BPM at 4:02:31 PM Average HR: 79 BPM Sinus rhythm 1.4% PACs APCs occasionally associated with palpitations  TTE 08/24/2016: Study Conclusions  - Left ventricle: The cavity size was normal. There was mild   concentric hypertrophy. Systolic function was normal. The   estimated ejection fraction was in the range of 60% to 65%. Wall   motion was normal; there were no  regional wall motion   abnormalities. Doppler parameters are consistent with abnormal   left ventricular relaxation (grade 1 diastolic dysfunction). - Mitral valve: Calcified annulus.  Cath 08/23/2016:  Prox RCA lesion, 45 %stenosed.  Ost LAD to Prox LAD lesion, 25 %stenosed.  Mid LAD to Dist LAD lesion, 40 %stenosed.  Dist LAD lesion, 60 %stenosed.  Ost 1st Mrg lesion, 20 %stenosed.  The left ventricular systolic function is normal.  LV end diastolic pressure is normal.  Ost 2nd Mrg lesion, 90 %stenosed.   Life style modification. Medical treatment as OM 2 is relatively small size vessel.   Past Medical History:  Diagnosis Date  . Agoraphobia without history of panic disorder   . Anginal pain ([place])   . Anxiety   . Arthritis    "qwhere" (04/03/2018)  . Childhood asthma   . Chronic bronchitis ([place])   . Chronic lower back pain   . COPD (chronic obstructive pulmonary disease) ([place])   . Coronary artery disease CARDIOLOGIST- DR  Doylene Canard  (VISIT 03-30-11 W/ CHART)   NON-OBS. CAD   (STRESS TEST NOV. 2011  . Daily headache   . DDD (degenerative disc disease)   . Depression   . DJD (degenerative joint disease)    JOINT PAIN  . Dyspnea    "anytime but mostly on exertion and smoking"  . Fibromyalgia   . Frequency of urination   . GERD (gastroesophageal reflux disease) AND HIATIAL HERNIA   CONTROLLED W/ NEXIUM  . Heart murmur   . Hemorrhoids   . High cholesterol   . History of blood transfusion    "related to anemia" (04/03/2018)  . History of gastric ulcer 2004  . History of hiatal hernia   . Hypertension   . IBS (irritable bowel syndrome)   . Incisional pain s/p interstim implant 1st stage--- 12-07-11    left upper buttock-- pt states dressing clean dry and intact (on 12-08-11)  . Insomnia   . Iron deficiency anemia   . Nocturia   . Non-productive cough   . Numbness in both hands AT TIMES  . OSA (obstructive sleep apnea)    "suppose to wear mask; I don't"  (04/03/2018)  . Urge urinary incontinence     Past Surgical History:  Procedure Laterality Date  . ANTERIOR CERVICAL DECOMP/DISCECTOMY FUSION  2008   C4 - T1 ("screws from 1st OR came out")  . ANTERIOR CERVICAL DECOMP/DISCECTOMY FUSION  2002   C4 - 7  . APPENDECTOMY  1982  . BACK SURGERY    . CARDIAC CATHETERIZATION  2003  . CARDIAC CATHETERIZATION N/A 08/23/2016   Procedure: Left Heart Cath and Coronary Angiography;  Surgeon: Dixie Dials, MD;  Location: [facility];  Service: Cardiovascular;  Laterality: N/A;  . CARPAL TUNNEL RELEASE Right   . CATARACT EXTRACTION W/ INTRAOCULAR LENS  IMPLANT, BILATERAL  2016-2018  . CHOLECYSTECTOMY OPEN  1982  . CYSTO/ HOD/ BLADDER BX  2006  . CYSTO/ HOD/  BLADDER BX/ FULGERATION  06-12-2011  . FINGER SURGERY Right 2001   "replaced worn cartilage on thumb w/tendons"  . HYSTEROSCOPY W/D&C  2005  . INTERSTIM IMPLANT PLACEMENT  12/07/2011   Procedure: Barrie Lyme IMPLANT FIRST STAGE;  Surgeon: [name], MD;  Location: [facility];  Service: Urology;  Laterality: Right;  . INTERSTIM IMPLANT PLACEMENT  12/14/2011   Procedure: Barrie Lyme IMPLANT SECOND STAGE;  Surgeon: [name], MD;  Location: [facility];  Service: Urology;  Laterality: Right;  rad tech ok by vickie at main  . LUMBAR LAMINECTOMY  2007   L3 - 5  . TONSILLECTOMY AND ADENOIDECTOMY  1965  . TUBAL LIGATION    . WRIST SURGERY Left    TFCC ("tendon repair")    MEDICATIONS: . acetaminophen (TYLENOL) 650 MG CR tablet  . albuterol (PROVENTIL HFA;VENTOLIN HFA) 108 (90 BASE) MCG/ACT inhaler  . Ascorbic Acid (VITAMIN C) 1000 MG tablet  . aspirin EC 81 MG EC tablet  . buPROPion (WELLBUTRIN XL) 300 MG 24 hr tablet  . cholecalciferol (VITAMIN D) 1000 UNITS tablet  . clonazePAM (KLONOPIN) 1 MG tablet  . diltiazem (CARDIZEM CD) 120 MG 24 hr capsule  . DULoxetine (CYMBALTA) 60 MG capsule  . fenofibrate (TRICOR) 48 MG tablet  . ferrous sulfate  325 (65 FE) MG tablet  . gabapentin (NEURONTIN) 100 MG capsule  . hydrALAZINE (APRESOLINE) 25 MG tablet  . Magnesium 500 MG CAPS  . montelukast (SINGULAIR) 10 MG tablet  . nitroGLYCERIN (NITROSTAT) 0.4 MG SL tablet  . pantoprazole (PROTONIX) 40 MG tablet  . rosuvastatin (CRESTOR) 10 MG tablet  . SUPER B COMPLEX/C PO  . tiZANidine (ZANAFLEX) 4 MG tablet   No current facility-administered medications for this encounter.     Wynonia Musty [facility]/Anesthesiology Phone [phone] 11/25/2018 12:12 PM

## 2018-11-21 NOTE — Telephone Encounter (Signed)
Will route to Dr. Curt Bears for advisement

## 2018-11-22 NOTE — Telephone Encounter (Signed)
OK for Lipitor 20 mg.

## 2018-11-25 NOTE — Anesthesia Preprocedure Evaluation (Addendum)
Anesthesia Evaluation  Patient identified by MRN, date of birth, ID band Patient awake    Reviewed: Allergy & Precautions, NPO status , Patient's Chart, lab work & pertinent test results  History of Anesthesia Complications Negative for: history of anesthetic complications  Airway Mallampati: III  TM Distance: >3 FB Neck ROM: Full    Dental  (+) Dental Advisory Given, Chipped,    Pulmonary asthma , sleep apnea , COPD,  COPD inhaler, Current Smoker,    Pulmonary exam normal breath sounds clear to auscultation       Cardiovascular hypertension, Pt. on medications + CAD  Normal cardiovascular exam Rhythm:Regular Rate:Normal  TTE 2017: EF 16-10%, grade 1 diastolic dysfunction    Neuro/Psych  Headaches, Anxiety Depression    GI/Hepatic hiatal hernia, GERD  Medicated,  Endo/Other  negative endocrine ROS  Renal/GU ARFRenal disease     Musculoskeletal  (+) Arthritis , Osteoarthritis,  Fibromyalgia -  Abdominal   Peds  Hematology  (+) anemia ,   Anesthesia Other Findings   Reproductive/Obstetrics                          Anesthesia Physical Anesthesia Plan  ASA: III  Anesthesia Plan: General   Post-op Pain Management:    Induction: Intravenous  PONV Risk Score and Plan: 3 and Treatment may vary due to age or medical condition, Ondansetron, Dexamethasone and Midazolam  Airway Management Planned: Oral ETT  Additional Equipment: Arterial line  Intra-op Plan:   Post-operative Plan: Extubation in OR  Informed Consent: I have reviewed the patients History and Physical, chart, labs and discussed the procedure including the risks, benefits and alternatives for the proposed anesthesia with the patient or authorized representative who has indicated his/her understanding and acceptance.   Dental advisory given  Plan Discussed with: CRNA, Anesthesiologist and Surgeon  Anesthesia Plan Comments:  (See PAT note 11/19/2018 by Karoline Caldwell, PA-C )     Anesthesia Quick Evaluation

## 2018-11-25 NOTE — Telephone Encounter (Signed)
Ok to switch to Lipitor 20 mg

## 2018-11-26 MED ORDER — TRANEXAMIC ACID-NACL 1000-0.7 MG/100ML-% IV SOLN
1000.0000 mg | INTRAVENOUS | Status: AC
  Administered 2018-11-27: 1000 mg via INTRAVENOUS
  Filled 2018-11-26: qty 100

## 2018-11-26 NOTE — Telephone Encounter (Signed)
Attempted to reach patient but was unsuccessful. Will try again later. 

## 2018-11-27 ENCOUNTER — Inpatient Hospital Stay (HOSPITAL_COMMUNITY): Payer: Medicare Other | Admitting: Physician Assistant

## 2018-11-27 ENCOUNTER — Inpatient Hospital Stay (HOSPITAL_COMMUNITY)
Admission: RE | Admit: 2018-11-27 | Discharge: 2018-11-30 | DRG: 455 | Disposition: A | Discharge status: Home Health | Payer: Medicare Other | Source: Ambulatory Visit | Attending: Orthopedic Surgery | Admitting: Orthopedic Surgery

## 2018-11-27 ENCOUNTER — Inpatient Hospital Stay (HOSPITAL_COMMUNITY): Payer: Medicare Other | Admitting: Certified Registered Nurse Anesthetist

## 2018-11-27 ENCOUNTER — Encounter (HOSPITAL_COMMUNITY): Payer: Self-pay

## 2018-11-27 ENCOUNTER — Encounter (HOSPITAL_COMMUNITY)
Admission: RE | Disposition: A | Discharge status: Home Health | Payer: Self-pay | Source: Ambulatory Visit | Attending: Orthopedic Surgery

## 2018-11-27 ENCOUNTER — Inpatient Hospital Stay (HOSPITAL_COMMUNITY): Payer: Medicare Other

## 2018-11-27 ENCOUNTER — Other Ambulatory Visit: Payer: Self-pay

## 2018-11-27 DIAGNOSIS — Z79899 Other long term (current) drug therapy: Secondary | ICD-10-CM | POA: Diagnosis not present

## 2018-11-27 DIAGNOSIS — F4002 Agoraphobia without panic disorder: Secondary | ICD-10-CM | POA: Diagnosis present

## 2018-11-27 DIAGNOSIS — I10 Essential (primary) hypertension: Secondary | ICD-10-CM | POA: Diagnosis not present

## 2018-11-27 DIAGNOSIS — Z882 Allergy status to sulfonamides status: Secondary | ICD-10-CM

## 2018-11-27 DIAGNOSIS — Z881 Allergy status to other antibiotic agents status: Secondary | ICD-10-CM | POA: Diagnosis not present

## 2018-11-27 DIAGNOSIS — M797 Fibromyalgia: Secondary | ICD-10-CM | POA: Diagnosis not present

## 2018-11-27 DIAGNOSIS — M4316 Spondylolisthesis, lumbar region: Secondary | ICD-10-CM | POA: Diagnosis present

## 2018-11-27 DIAGNOSIS — I2510 Atherosclerotic heart disease of native coronary artery without angina pectoris: Secondary | ICD-10-CM | POA: Diagnosis not present

## 2018-11-27 DIAGNOSIS — M48061 Spinal stenosis, lumbar region without neurogenic claudication: Secondary | ICD-10-CM | POA: Diagnosis not present

## 2018-11-27 DIAGNOSIS — D649 Anemia, unspecified: Secondary | ICD-10-CM | POA: Diagnosis present

## 2018-11-27 DIAGNOSIS — M961 Postlaminectomy syndrome, not elsewhere classified: Secondary | ICD-10-CM | POA: Diagnosis not present

## 2018-11-27 DIAGNOSIS — D72829 Elevated white blood cell count, unspecified: Secondary | ICD-10-CM

## 2018-11-27 DIAGNOSIS — G4730 Sleep apnea, unspecified: Secondary | ICD-10-CM | POA: Diagnosis present

## 2018-11-27 DIAGNOSIS — Z888 Allergy status to other drugs, medicaments and biological substances status: Secondary | ICD-10-CM | POA: Diagnosis not present

## 2018-11-27 DIAGNOSIS — M545 Low back pain, unspecified: Secondary | ICD-10-CM | POA: Diagnosis present

## 2018-11-27 DIAGNOSIS — Z886 Allergy status to analgesic agent status: Secondary | ICD-10-CM

## 2018-11-27 DIAGNOSIS — J449 Chronic obstructive pulmonary disease, unspecified: Secondary | ICD-10-CM | POA: Diagnosis not present

## 2018-11-27 DIAGNOSIS — F329 Major depressive disorder, single episode, unspecified: Secondary | ICD-10-CM | POA: Diagnosis present

## 2018-11-27 DIAGNOSIS — F419 Anxiety disorder, unspecified: Secondary | ICD-10-CM | POA: Diagnosis present

## 2018-11-27 DIAGNOSIS — E7800 Pure hypercholesterolemia, unspecified: Secondary | ICD-10-CM | POA: Diagnosis present

## 2018-11-27 DIAGNOSIS — M4326 Fusion of spine, lumbar region: Secondary | ICD-10-CM | POA: Diagnosis not present

## 2018-11-27 DIAGNOSIS — Z419 Encounter for procedure for purposes other than remedying health state, unspecified: Secondary | ICD-10-CM

## 2018-11-27 DIAGNOSIS — M5136 Other intervertebral disc degeneration, lumbar region: Secondary | ICD-10-CM | POA: Diagnosis present

## 2018-11-27 DIAGNOSIS — Z981 Arthrodesis status: Secondary | ICD-10-CM | POA: Diagnosis not present

## 2018-11-27 HISTORY — PX: ANTERIOR LATERAL LUMBAR FUSION WITH PERCUTANEOUS SCREW 2 LEVEL: SHX5554

## 2018-11-27 SURGERY — ANTERIOR LATERAL LUMBAR FUSION WITH PERCUTANEOUS SCREW 2 LEVEL
Anesthesia: General | Site: Flank | Laterality: Left

## 2018-11-27 MED ORDER — HEMOSTATIC AGENTS (NO CHARGE) OPTIME
TOPICAL | Status: DC | PRN
  Administered 2018-11-27: 1 via TOPICAL

## 2018-11-27 MED ORDER — HYDROMORPHONE HCL 1 MG/ML IJ SOLN
INTRAMUSCULAR | Status: AC
  Administered 2018-11-27: 0.5 mg via INTRAVENOUS
  Filled 2018-11-27: qty 1

## 2018-11-27 MED ORDER — ROSUVASTATIN CALCIUM 5 MG PO TABS
10.0000 mg | ORAL_TABLET | Freq: Every day | ORAL | Status: DC
  Administered 2018-11-28 – 2018-11-29 (×2): 10 mg via ORAL
  Filled 2018-11-27 (×2): qty 2

## 2018-11-27 MED ORDER — HYDROMORPHONE HCL 1 MG/ML IJ SOLN
0.2500 mg | INTRAMUSCULAR | Status: DC | PRN
  Administered 2018-11-27 (×4): 0.5 mg via INTRAVENOUS

## 2018-11-27 MED ORDER — 0.9 % SODIUM CHLORIDE (POUR BTL) OPTIME
TOPICAL | Status: DC | PRN
  Administered 2018-11-27: 1000 mL

## 2018-11-27 MED ORDER — OXYCODONE HCL 5 MG PO TABS: 5.0000 mg | ORAL_TABLET | ORAL | Status: DC | PRN

## 2018-11-27 MED ORDER — ACETAMINOPHEN 10 MG/ML IV SOLN
INTRAVENOUS | Status: AC
  Filled 2018-11-27: qty 100

## 2018-11-27 MED ORDER — THROMBIN 20000 UNITS EX SOLR
CUTANEOUS | Status: DC | PRN
  Administered 2018-11-27: 20000 [IU] via TOPICAL

## 2018-11-27 MED ORDER — PHENOL 1.4 % MT LIQD: 1.0000 | OROMUCOSAL | Status: DC | PRN

## 2018-11-27 MED ORDER — ONDANSETRON HCL 4 MG/2ML IJ SOLN
INTRAMUSCULAR | Status: AC
  Filled 2018-11-27: qty 2

## 2018-11-27 MED ORDER — SUGAMMADEX SODIUM 500 MG/5ML IV SOLN
INTRAVENOUS | Status: AC
  Filled 2018-11-27: qty 5

## 2018-11-27 MED ORDER — METHOCARBAMOL 500 MG PO TABS
500.0000 mg | ORAL_TABLET | Freq: Four times a day (QID) | ORAL | Status: DC | PRN
  Administered 2018-11-28: 500 mg via ORAL
  Filled 2018-11-27: qty 1

## 2018-11-27 MED ORDER — VANCOMYCIN HCL IN DEXTROSE 1-5 GM/200ML-% IV SOLN
INTRAVENOUS | Status: AC
  Administered 2018-11-27: 1000 mg via INTRAVENOUS
  Filled 2018-11-27: qty 200

## 2018-11-27 MED ORDER — DEXAMETHASONE SODIUM PHOSPHATE 10 MG/ML IJ SOLN
INTRAMUSCULAR | Status: DC | PRN
  Administered 2018-11-27: 4 mg via INTRAVENOUS

## 2018-11-27 MED ORDER — LACTATED RINGERS IV SOLN
INTRAVENOUS | Status: DC | PRN
  Administered 2018-11-27 (×3): via INTRAVENOUS

## 2018-11-27 MED ORDER — ONDANSETRON HCL 4 MG/2ML IJ SOLN: 4.0000 mg | Freq: Four times a day (QID) | INTRAMUSCULAR | Status: DC | PRN

## 2018-11-27 MED ORDER — THROMBIN 20000 UNITS EX SOLR
CUTANEOUS | Status: AC
  Filled 2018-11-27: qty 20000

## 2018-11-27 MED ORDER — VANCOMYCIN HCL IN DEXTROSE 1-5 GM/200ML-% IV SOLN
1000.0000 mg | Freq: Once | INTRAVENOUS | Status: AC
  Administered 2018-11-27: 1000 mg via INTRAVENOUS
  Filled 2018-11-27: qty 200

## 2018-11-27 MED ORDER — ACETAMINOPHEN 650 MG RE SUPP: 650.0000 mg | RECTAL | Status: DC | PRN

## 2018-11-27 MED ORDER — ACETAMINOPHEN 325 MG PO TABS
650.0000 mg | ORAL_TABLET | ORAL | Status: DC | PRN
  Administered 2018-11-28 – 2018-11-30 (×3): 650 mg via ORAL
  Filled 2018-11-27 (×3): qty 2

## 2018-11-27 MED ORDER — LIDOCAINE HCL (CARDIAC) PF 100 MG/5ML IV SOSY
PREFILLED_SYRINGE | INTRAVENOUS | Status: DC | PRN
  Administered 2018-11-27: 100 mg via INTRAVENOUS

## 2018-11-27 MED ORDER — GABAPENTIN 100 MG PO CAPS
100.0000 mg | ORAL_CAPSULE | Freq: Three times a day (TID) | ORAL | Status: DC
  Administered 2018-11-27 – 2018-11-28 (×2): 100 mg via ORAL
  Filled 2018-11-27 (×3): qty 1

## 2018-11-27 MED ORDER — DULOXETINE HCL 30 MG PO CPEP
60.0000 mg | ORAL_CAPSULE | Freq: Every day | ORAL | Status: DC
  Administered 2018-11-27 – 2018-11-29 (×2): 60 mg via ORAL
  Filled 2018-11-27 (×2): qty 2

## 2018-11-27 MED ORDER — BUPIVACAINE-EPINEPHRINE 0.25% -1:200000 IJ SOLN
INTRAMUSCULAR | Status: DC | PRN
  Administered 2018-11-27: 10 mL

## 2018-11-27 MED ORDER — NITROGLYCERIN 0.4 MG SL SUBL: 0.4000 mg | SUBLINGUAL_TABLET | SUBLINGUAL | Status: DC | PRN

## 2018-11-27 MED ORDER — VANCOMYCIN HCL IN DEXTROSE 1-5 GM/200ML-% IV SOLN
1000.0000 mg | Freq: Once | INTRAVENOUS | Status: AC
  Administered 2018-11-27: 1000 mg via INTRAVENOUS

## 2018-11-27 MED ORDER — MORPHINE SULFATE (PF) 2 MG/ML IV SOLN: 2.0000 mg | INTRAVENOUS | Status: AC | PRN

## 2018-11-27 MED ORDER — ONDANSETRON HCL 4 MG PO TABS: 4.0000 mg | ORAL_TABLET | Freq: Four times a day (QID) | ORAL | Status: DC | PRN

## 2018-11-27 MED ORDER — SODIUM CHLORIDE 0.9 % IV SOLN: 250.0000 mL | INTRAVENOUS | Status: DC

## 2018-11-27 MED ORDER — PHENYLEPHRINE 40 MCG/ML (10ML) SYRINGE FOR IV PUSH (FOR BLOOD PRESSURE SUPPORT)
PREFILLED_SYRINGE | INTRAVENOUS | Status: DC | PRN
  Administered 2018-11-27: 120 ug via INTRAVENOUS

## 2018-11-27 MED ORDER — OXYCODONE HCL 5 MG/5ML PO SOLN: 5.0000 mg | Freq: Once | ORAL | Status: DC | PRN

## 2018-11-27 MED ORDER — SODIUM CHLORIDE 0.9 % IV SOLN
INTRAVENOUS | Status: DC | PRN
  Administered 2018-11-27: 40 ug/min via INTRAVENOUS

## 2018-11-27 MED ORDER — OXYCODONE-ACETAMINOPHEN 10-325 MG PO TABS: 1.0000 | ORAL_TABLET | Freq: Four times a day (QID) | ORAL | 0 refills | Status: DC | PRN

## 2018-11-27 MED ORDER — SODIUM CHLORIDE 0.9% FLUSH: 3.0000 mL | Freq: Two times a day (BID) | INTRAVENOUS | Status: DC

## 2018-11-27 MED ORDER — PROPOFOL 10 MG/ML IV BOLUS
INTRAVENOUS | Status: DC | PRN
  Administered 2018-11-27: 200 mg via INTRAVENOUS

## 2018-11-27 MED ORDER — EPHEDRINE SULFATE-NACL 50-0.9 MG/10ML-% IV SOSY
PREFILLED_SYRINGE | INTRAVENOUS | Status: DC | PRN
  Administered 2018-11-27: 20 mg via INTRAVENOUS
  Administered 2018-11-27 (×2): 25 mg via INTRAVENOUS
  Administered 2018-11-27: 30 mg via INTRAVENOUS

## 2018-11-27 MED ORDER — METHOCARBAMOL 500 MG PO TABS: 500.0000 mg | ORAL_TABLET | Freq: Three times a day (TID) | ORAL | 0 refills | Status: DC | PRN

## 2018-11-27 MED ORDER — OXYCODONE HCL 5 MG PO TABS
10.0000 mg | ORAL_TABLET | ORAL | Status: DC | PRN
  Administered 2018-11-27 – 2018-11-28 (×2): 10 mg via ORAL
  Filled 2018-11-27 (×2): qty 2

## 2018-11-27 MED ORDER — HYDRALAZINE HCL 10 MG PO TABS
25.0000 mg | ORAL_TABLET | Freq: Two times a day (BID) | ORAL | Status: DC
  Administered 2018-11-29: 25 mg via ORAL
  Filled 2018-11-27 (×4): qty 3

## 2018-11-27 MED ORDER — MENTHOL 3 MG MT LOZG: 1.0000 | LOZENGE | OROMUCOSAL | Status: DC | PRN

## 2018-11-27 MED ORDER — ONDANSETRON HCL 4 MG/2ML IJ SOLN
INTRAMUSCULAR | Status: DC | PRN
  Administered 2018-11-27: 4 mg via INTRAVENOUS

## 2018-11-27 MED ORDER — EPHEDRINE 5 MG/ML INJ
INTRAVENOUS | Status: AC
  Filled 2018-11-27: qty 10

## 2018-11-27 MED ORDER — OXYCODONE HCL 5 MG PO TABS: 5.0000 mg | ORAL_TABLET | Freq: Once | ORAL | Status: DC | PRN

## 2018-11-27 MED ORDER — PROPOFOL 500 MG/50ML IV EMUL
INTRAVENOUS | Status: DC | PRN
  Administered 2018-11-27 (×2): 75 ug/kg/min via INTRAVENOUS

## 2018-11-27 MED ORDER — FENTANYL CITRATE (PF) 100 MCG/2ML IJ SOLN
INTRAMUSCULAR | Status: DC | PRN
  Administered 2018-11-27: 75 ug via INTRAVENOUS
  Administered 2018-11-27: 25 ug via INTRAVENOUS
  Administered 2018-11-27: 125 ug via INTRAVENOUS
  Administered 2018-11-27 (×3): 50 ug via INTRAVENOUS
  Administered 2018-11-27 (×3): 25 ug via INTRAVENOUS

## 2018-11-27 MED ORDER — PROPOFOL 10 MG/ML IV BOLUS
INTRAVENOUS | Status: AC
  Filled 2018-11-27: qty 20

## 2018-11-27 MED ORDER — SUCCINYLCHOLINE CHLORIDE 200 MG/10ML IV SOSY
PREFILLED_SYRINGE | INTRAVENOUS | Status: DC | PRN
  Administered 2018-11-27: 100 mg via INTRAVENOUS

## 2018-11-27 MED ORDER — METHOCARBAMOL 1000 MG/10ML IJ SOLN
500.0000 mg | Freq: Four times a day (QID) | INTRAVENOUS | Status: DC | PRN
  Filled 2018-11-27: qty 5

## 2018-11-27 MED ORDER — BUPROPION HCL ER (XL) 300 MG PO TB24
300.0000 mg | ORAL_TABLET | Freq: Every day | ORAL | Status: DC
  Administered 2018-11-28 – 2018-11-29 (×2): 300 mg via ORAL
  Filled 2018-11-27 (×2): qty 1

## 2018-11-27 MED ORDER — ALBUTEROL SULFATE (2.5 MG/3ML) 0.083% IN NEBU: 2.5000 mg | INHALATION_SOLUTION | Freq: Four times a day (QID) | RESPIRATORY_TRACT | Status: DC | PRN

## 2018-11-27 MED ORDER — ACETAMINOPHEN 10 MG/ML IV SOLN
1000.0000 mg | Freq: Once | INTRAVENOUS | Status: DC | PRN
  Administered 2018-11-27: 1000 mg via INTRAVENOUS

## 2018-11-27 MED ORDER — SODIUM CHLORIDE 0.9% FLUSH: 3.0000 mL | INTRAVENOUS | Status: DC | PRN

## 2018-11-27 MED ORDER — LACTATED RINGERS IV SOLN: INTRAVENOUS | Status: DC

## 2018-11-27 MED ORDER — FENTANYL CITRATE (PF) 250 MCG/5ML IJ SOLN
INTRAMUSCULAR | Status: AC
  Filled 2018-11-27: qty 5

## 2018-11-27 MED ORDER — DILTIAZEM HCL ER COATED BEADS 120 MG PO CP24
120.0000 mg | ORAL_CAPSULE | Freq: Every day | ORAL | Status: DC
  Administered 2018-11-28 – 2018-11-29 (×2): 120 mg via ORAL
  Filled 2018-11-27 (×2): qty 1

## 2018-11-27 MED ORDER — CLONAZEPAM 1 MG PO TABS
1.0000 mg | ORAL_TABLET | Freq: Two times a day (BID) | ORAL | Status: DC
  Administered 2018-11-27 – 2018-11-28 (×2): 1 mg via ORAL
  Filled 2018-11-27 (×2): qty 1

## 2018-11-27 MED ORDER — ONDANSETRON HCL 4 MG PO TABS: 4.0000 mg | ORAL_TABLET | Freq: Three times a day (TID) | ORAL | 0 refills | Status: DC | PRN

## 2018-11-27 MED ORDER — PROMETHAZINE HCL 25 MG/ML IJ SOLN: 6.2500 mg | INTRAMUSCULAR | Status: DC | PRN

## 2018-11-27 SURGICAL SUPPLY — 93 items
ADH SKN CLS APL DERMABOND .7 (GAUZE/BANDAGES/DRESSINGS)
AGENT HMST KT MTR STRL THRMB (HEMOSTASIS) ×1
APPLIER CLIP 11 MED OPEN (CLIP)
APR CLP MED 11 20 MLT OPN (CLIP)
BLADE CLIPPER SURG (BLADE) IMPLANT
BLADE SURG 10 STRL SS (BLADE) ×3 IMPLANT
BONE MATRIX OSTEOCEL PRO MED (Bone Implant) ×2 IMPLANT
BONE VIVIGEN FORMABLE 5.4CC (Bone Implant) ×3 IMPLANT
CAGE MODULUS XL 10X18X55 - 10 (Cage) ×2 IMPLANT
CAGE MODULUS XL 12X18X50 - 10 (Cage) ×2 IMPLANT
CLIP APPLIE 11 MED OPEN (CLIP) IMPLANT
CLOSURE WOUND 1/2 X4 (GAUZE/BANDAGES/DRESSINGS)
CORDS BIPOLAR (ELECTRODE) ×3 IMPLANT
COVER SURGICAL LIGHT HANDLE (MISCELLANEOUS) ×3 IMPLANT
COVER WAND RF STERILE (DRAPES) ×3 IMPLANT
DERMABOND ADVANCED (GAUZE/BANDAGES/DRESSINGS)
DERMABOND ADVANCED .7 DNX12 (GAUZE/BANDAGES/DRESSINGS) ×1 IMPLANT
DRAPE C-ARM 42X72 X-RAY (DRAPES) ×3 IMPLANT
DRAPE INCISE IOBAN 66X45 STRL (DRAPES) ×2 IMPLANT
DRAPE ORTHO SPLIT 77X108 STRL (DRAPES) ×3
DRAPE POUCH INSTRU U-SHP 10X18 (DRAPES) ×3 IMPLANT
DRAPE SURG ORHT 6 SPLT 77X108 (DRAPES) ×1 IMPLANT
DRAPE U-SHAPE 47X51 STRL (DRAPES) ×6 IMPLANT
DRSG AQUACEL AG ADV 3.5X10 (GAUZE/BANDAGES/DRESSINGS) ×1 IMPLANT
DURAPREP 26ML APPLICATOR (WOUND CARE) ×3 IMPLANT
ELECT BLADE 4.0 EZ CLEAN MEGAD (MISCELLANEOUS) ×3
ELECT CAUTERY BLADE 6.4 (BLADE) ×3 IMPLANT
ELECT PENCIL ROCKER SW 15FT (MISCELLANEOUS) ×3 IMPLANT
ELECT REM PT RETURN 9FT ADLT (ELECTROSURGICAL) ×3
ELECTRODE BLDE 4.0 EZ CLN MEGD (MISCELLANEOUS) ×1 IMPLANT
ELECTRODE REM PT RTRN 9FT ADLT (ELECTROSURGICAL) ×1 IMPLANT
GAUZE 4X4 16PLY RFD (DISPOSABLE) ×1 IMPLANT
GLOVE BIOGEL PI IND STRL 7.0 (GLOVE) IMPLANT
GLOVE BIOGEL PI IND STRL 8.5 (GLOVE) ×1 IMPLANT
GLOVE BIOGEL PI INDICATOR 7.0 (GLOVE) ×4
GLOVE BIOGEL PI INDICATOR 8.5 (GLOVE) ×2
GLOVE ECLIPSE 7.0 STRL STRAW (GLOVE) ×4 IMPLANT
GLOVE SS BIOGEL STRL SZ 8.5 (GLOVE) ×1 IMPLANT
GLOVE SUPERSENSE BIOGEL SZ 8.5 (GLOVE) ×2
GOWN STRL REUS W/ TWL LRG LVL3 (GOWN DISPOSABLE) ×1 IMPLANT
GOWN STRL REUS W/ TWL XL LVL3 (GOWN DISPOSABLE) IMPLANT
GOWN STRL REUS W/TWL 2XL LVL3 (GOWN DISPOSABLE) ×4 IMPLANT
GOWN STRL REUS W/TWL LRG LVL3 (GOWN DISPOSABLE) ×6
GOWN STRL REUS W/TWL XL LVL3 (GOWN DISPOSABLE) ×3
GRAFT BNE MATRIX VG FRMBL MD 5 (Bone Implant) IMPLANT
GUIDEWIRE NITINOL BEVEL TIP (WIRE) ×12 IMPLANT
KIT BASIN OR (CUSTOM PROCEDURE TRAY) ×3 IMPLANT
KIT DILATOR XLIF 5 (KITS) ×2 IMPLANT
KIT SURGICAL ACCESS MAXCESS 4 (KITS) ×4 IMPLANT
KIT TURNOVER KIT B (KITS) ×3 IMPLANT
KIT XLIF (KITS) ×2
MODULE EMG NDL SSEP NVM5 (NEEDLE) IMPLANT
MODULE EMG NEEDLE SSEP NVM5 (NEEDLE) ×3 IMPLANT
MODULE NVM5 NEXT GEN EMG (NEEDLE) ×2 IMPLANT
NDL I-PASS III (NEEDLE) IMPLANT
NDL SPNL 18GX3.5 QUINCKE PK (NEEDLE) ×1 IMPLANT
NEEDLE 22X1 1/2 (OR ONLY) (NEEDLE) ×3 IMPLANT
NEEDLE I-PASS III (NEEDLE) ×3 IMPLANT
NEEDLE SPNL 18GX3.5 QUINCKE PK (NEEDLE) ×3 IMPLANT
NS IRRIG 1000ML POUR BTL (IV SOLUTION) ×3 IMPLANT
PACK LAMINECTOMY ORTHO (CUSTOM PROCEDURE TRAY) ×3 IMPLANT
PACK UNIVERSAL I (CUSTOM PROCEDURE TRAY) ×3 IMPLANT
PAD ARMBOARD 7.5X6 YLW CONV (MISCELLANEOUS) ×8 IMPLANT
ROD RELINE TI MAS 5.5X70MM LD (Rod) ×2 IMPLANT
ROD RELINE TI MAS LD 5.5X65 (Rod) ×2 IMPLANT
SCREW LOCK RELINE 5.5 TULIP (Screw) ×12 IMPLANT
SCREW RELINE MAS POLY 6.5X40MM (Screw) ×8 IMPLANT
SCREW RELINE RED 6.5X45MM POLY (Screw) ×4 IMPLANT
SPONGE INTESTINAL PEANUT (DISPOSABLE) ×2 IMPLANT
SPONGE LAP 4X18 RFD (DISPOSABLE) ×3 IMPLANT
STAPLER VISISTAT (STAPLE) ×2 IMPLANT
STRIP CLOSURE SKIN 1/2X4 (GAUZE/BANDAGES/DRESSINGS) IMPLANT
SURGIFLO W/THROMBIN 8M KIT (HEMOSTASIS) ×2 IMPLANT
SUT BONE WAX W31G (SUTURE) ×3 IMPLANT
SUT MON AB 3-0 SH 27 (SUTURE) ×3
SUT MON AB 3-0 SH27 (SUTURE) ×1 IMPLANT
SUT PROLENE 5 0 C 1 24 (SUTURE) IMPLANT
SUT SILK 2 0 TIES 10X30 (SUTURE) ×1 IMPLANT
SUT SILK 3 0 TIES 10X30 (SUTURE) ×1 IMPLANT
SUT VIC AB 0 CT1 27 (SUTURE) ×3
SUT VIC AB 0 CT1 27XBRD ANBCTR (SUTURE) IMPLANT
SUT VIC AB 1 CT1 27 (SUTURE) ×6
SUT VIC AB 1 CT1 27XBRD ANBCTR (SUTURE) ×2 IMPLANT
SUT VIC AB 1 CTX 36 (SUTURE) ×6
SUT VIC AB 1 CTX36XBRD ANBCTR (SUTURE) ×2 IMPLANT
SUT VIC AB 2-0 CT1 18 (SUTURE) ×3 IMPLANT
SYR BULB IRRIGATION 50ML (SYRINGE) ×3 IMPLANT
SYR CONTROL 10ML LL (SYRINGE) ×3 IMPLANT
TAPE CLOTH 4X10 WHT NS (GAUZE/BANDAGES/DRESSINGS) ×6 IMPLANT
TOWEL OR 17X24 6PK STRL BLUE (TOWEL DISPOSABLE) ×3 IMPLANT
TOWEL OR 17X26 10 PK STRL BLUE (TOWEL DISPOSABLE) ×3 IMPLANT
TRAY FOLEY CATH SILVER 16FR (SET/KITS/TRAYS/PACK) ×3 IMPLANT
WATER STERILE IRR 1000ML POUR (IV SOLUTION) ×1 IMPLANT

## 2018-11-27 NOTE — Anesthesia Procedure Notes (Signed)
Arterial Line Insertion Start/End12/10/2018 11:50 AM, 11/27/2018 12:05 PM Performed by: Carney Living, CRNA, CRNA  Patient location: Pre-op. Preanesthetic checklist: patient identified, risks and benefits discussed and pre-op evaluation Lidocaine 1% used for infiltration Right, radial was placed Catheter size: 20 G Hand hygiene performed  and maximum sterile barriers used   Attempts: 1 Procedure performed without using ultrasound guided technique. Following insertion, dressing applied and Biopatch. Post procedure assessment: normal and unchanged  Patient tolerated the procedure well with no immediate complications.

## 2018-11-27 NOTE — Telephone Encounter (Signed)
Tried again to reach patient but she is currently admitted.

## 2018-11-27 NOTE — Transfer of Care (Signed)
Immediate Anesthesia Transfer of Care Note  Patient: [name]  Procedure(s) Performed: XLIF Lumbar 3-5, posterior spinal fusion L3-5 (Left Flank)  Patient Location: PACU  Anesthesia Type:General  Level of Consciousness: awake and patient cooperative  Airway & Oxygen Therapy: Patient Spontanous Breathing and Patient connected to nasal cannula oxygen  Post-op Assessment: Report given to RN, Post -op Vital signs reviewed and stable and Patient moving all extremities X 4  Post vital signs: Reviewed and stable  Last Vitals:  Vitals Value Taken Time  BP 163/84 11/27/2018  5:00 PM  Temp 36.1 C 11/27/2018  5:00 PM  Pulse 78 11/27/2018  5:00 PM  Resp 24 11/27/2018  5:00 PM  SpO2 94 % 11/27/2018  5:00 PM    Last Pain:  Vitals:   11/27/18 1700  TempSrc:   PainSc: Asleep         Complications: No apparent anesthesia complications

## 2018-11-27 NOTE — Anesthesia Postprocedure Evaluation (Signed)
Anesthesia Post Note  Patient: [name]  Procedure(s) Performed: XLIF Lumbar 3-5, posterior spinal fusion L3-5 (Left Flank)     Patient location during evaluation: PACU Anesthesia Type: General Level of consciousness: awake and alert Pain management: pain level controlled Vital Signs Assessment: post-procedure vital signs reviewed and stable Respiratory status: spontaneous breathing, nonlabored ventilation and respiratory function stable Cardiovascular status: blood pressure returned to baseline and stable Postop Assessment: no apparent nausea or vomiting Anesthetic complications: no    Last Vitals:  Vitals:   11/27/18 1835 11/27/18 1944  BP: (!) 133/58 (!) 130/58  Pulse: 85 90  Resp: 16 18  Temp: 36.6 C 36.6 C  SpO2: 97% 96%    Last Pain:  Vitals:   11/27/18 1944  TempSrc: Oral  PainSc:                  Brennan Bailey

## 2018-11-27 NOTE — Brief Op Note (Signed)
11/27/2018  5:02 PM  PATIENT:  [name]  62 y.o. 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, post laminectomy syndrome, degenerative slip L4-5  PROCEDURE:  Procedure(s) with comments: XLIF Lumbar 3-5, posterior spinal fusion L3-5 (Left) - 5.5 hrs for entire procedure  SURGEON:  Surgeon(s) and Role:    Melina Schools, MD - Primary  PHYSICIAN ASSISTANT:   ASSISTANTS: none   ANESTHESIA:   general  EBL:  100 mL   BLOOD ADMINISTERED:none  DRAINS: none   LOCAL MEDICATIONS USED:  MARCAINE     SPECIMEN:  No Specimen  DISPOSITION OF SPECIMEN:  N/A  COUNTS:  YES  TOURNIQUET:  * No tourniquets in log *  DICTATION: .Dragon Dictation  PLAN OF CARE: Admit to inpatient   PATIENT DISPOSITION:  PACU - hemodynamically stable.

## 2018-11-27 NOTE — Op Note (Signed)
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  Lateral interbody fusion L3-4 and L4-5. 2.  Posterior segmental fusion L3-5 with pedicle screw fixation.  Complications: None  Implants: Nuvasive 3D titanium cage: L3/4: 12 x 18 x 50  with10 degree lordosis.  L4-5: 10 x 18 x 55 with 10 degree lordosis.  Nuvasaive MIS posterior pedicle screw fixation: L3 and L4: 40 by 6.5 pedicle screws.  L5: 45 x 6.5 mm  Intraoperative neuro monitoring: No adverse free running EMG or SSEP activity was noted.  All 6 pedicle screws were directly stimulated and there was no abnormal activity at greater than 40 mA.  Allograft:Vivogen and osteocell.  Indications: This is a very pleasant 62 year old had a previous L3-5 posterior decompression is continued to have significant back buttock and bilateral leg pain.  Imaging studies demonstrated current spinal stenosis at L3-4, and grade 1 degenerative spondylolisthesis with advanced degenerative disc disease L 4/5.  Despite appropriate conservative care her quality of life continued to deteriorate.  Preop imaging study including a discogram and CT myelogram demonstrate advanced degenerative disc disease with discogenic back pain L3-4 and L4-5 with anterior listhesis at L4-5 and recurrent spinal stenosis at L3-4.  As a result of the failure of conservative management we elected to proceed with surgery.  All appropriate risks benefits and alternatives were discussed with the patient.  Operative procedure: Patient was brought the operating room placed upon the operating table.  After successful induction of general anesthesia and endotracheal intubation teds SCDs and a Foley were inserted.  Neuro monitoring representative then attached all appropriate monitors when the patient was placed in the lateral  decubitus position left side up on the pro axis spine table.  Axillary roll was placed and all bony prominences well-padded.  The patient was directly secured to the table with a significant amount of tape to hold her in a lateral decubitus position perpendicular to the floor.  Fluoroscopy was used to confirm that we had repositioning of both the L3-4 and L4-5 disc spaces in both the AP and lateral planes.  With the patient properly positioned and secured to the table the lateral abdomen and posterior spine were prepped and draped in a standard fashion.  Timeout was taken to confirm patient procedure and all other important data.  Once this was completed we then proceeded with a lateral interbody fusion.  I marked out the midportion of the L4 vertebral body on the lateral side of the skin and then infiltrated that area with quarter percent Marcaine lateral incision was made and dissected down to the fascia of the external oblique.  A second guide incision was made 1 fingerbreadth posteriorly this was smaller.  I advanced my finger down to the posterior aspect of the retroperitoneal entered into the retroperitoneum and began dissecting bluntly.  I then dissected from the undersurface of the external oblique until I could see my finger in the lateral incision.  I then took the first trocar and advanced it down to the surface of the psoas muscle.  I directly stimulated to confirm that the lumbar plexus was not under direct tension or compression.  Once I confirmed I was at a satisfactory position in the anterior third of the disc space I went through the psoas down to the lateral aspect of the L4-5 disc space.  A guidepin secured the first dilating tube in place.  I then circumferentially stimulated confirming I was not traumatizing the plexus.  I then sequentially dilated and stimulated with each dilatation.  The working trocar was then placed.  I then properly positioned this by slowly moving the blade posteriorly.   I made sure to keep the cuff of psoas muscle between myself and the plexus.  Direct stimulation posterior and inferior to my blade confirmed that the plexus was not proper position and was not being traumatized by my retractor blades.  With the lateral aspect of the disc now visualized in the lumbar plexus not under compression I have moved forward with the discectomy.  Annulotomy was performed and I advanced the Cobb elevator along the endplate of L4 and L5 until I reached the contralateral side and released this annulus as well.  I then used a box osteotome curettes and Kerrison rongeurs as well as pituitary rongeurs to remove all of the disc material.  Care was taken to ensure that the anterior and posterior longitudinal ligaments were not violated.  Once I had a complete discectomy done as the endplates of bleeding subchondral bone.  I then trialed sequentially and elected to place the 10 lordotic implant.  This provided the best fit in both the AP and lateral plane and also allowed for reduction of the spondylolisthesis and improvement of the foraminal volume.  The implant was obtained and packed with the allograft and malleted to the appropriate depth.  X-rays demonstrated that the implant was in the anterior third of the disc space that was still on the endplates of 4 and 5.  At this point I made sure that hemostasis using bipolar cautery and FloSeal.  I then remove the retractor system and allow the psoas muscle to relax for approximately 5 minutes.  Using the same technique I used an L4-5 I placed my first trocar through the psoas and onto the lateral aspect of the disc space.  Once secured into position I sequentially dilated as well as stimulated confirming that the plexus was not under compression or being traumatized.  The working trocar was placed and then gently moved posteriorly so I had adequate visualization of the disc space.  It was secured in place with the shim and I directly stimulated  posterior to the blades confirming the plexus was not under compression and was not being directly traumatized by the retractor system.  An annulotomy was performed and using the same technique as used at L4-5 I repeated the discectomy at L3-4.  I then trialed and placed the 12 lordotic spacer.  This titanium cage was packed again with the allograft and malleted to the position.  It provided excellent indirect decompression of the foramen thereby addressing her recurrent spinal stenosis.  Hemostasis was then obtained using bipolar cautery and FloSeal.  After final irrigation I remove the retractor and closed this wound with interrupted #1 Vicryl suture, 2-0 Vicryl suture, and 3-0 Monocryl.  The other incision was also irrigated and closed in a layered manner.  X-rays were satisfactory both cages were properly positioned.  With the patient maintained in the lateral position I proceeded with placing the pedicle screws.  Identified the lateral border of the L3 pedicle and made an incision and advanced the Jamshidi needle down to the lateral aspect of the pedicle.  Using fluoroscopic guidance as well as direct stimulation of the Jamshidi needle I advanced the Jamshidi needle into the L3 pedicle as I was nearing the medial wall I changed my view from the AP  to the lateral.  I confirmed that the tip of my Jamshidi needle had already broached the posterior wall of the vertebral body confirming at satisfactory trajectory.  I then advanced into the vertebral body and then placed my guidepin.  I repeated this exact same procedure at L4 and L5.  I then repeated this on the contralateral side till all 6 pedicles were cannulated.  At no point time during the placement of the Jamshidi needle was there any adverse free running EMG or SSEP activity to suggest breach.  Once all the guidepins were placed and measured and then placed the appropriate size pedicle screw.  The pedicle screw was advanced over the guidepin.  All screws  got excellent purchase.  I then directly stimulated each pedicle screw and there is no adverse activity at greater than 40 mA of stimulation.  I measured the distance and obtain the appropriate size rod and placed percutaneously through the 3 pedicle screws.  I then placed the top locking nuts and secured them in place.  Once both rods were in place I torqued off all of the top locking knots according manufacture standards.  At this point the final x-rays demonstrate satisfactory positioning of the intervertebral cages as well as the posterior pedicle screws.  The posterior incisions that were made to place the pedicle screws were irrigated and closed in a layered fashion with interrupted #1 Vicryl suture, 2-0 Vicryl suture, and 3-0 Monocryl sutures.  Steri-Strips and dry dressings were applied and the patient was ultimately extubated transfer the PACU without incident.  The end of the case all needle sponge counts were correct.  There were no adverse intraoperative events.

## 2018-11-27 NOTE — H&P (Signed)
Addendum H&P  Patient presents today for lateral interbody fusion L3-5 interbody fusion with posterior pedicle screw fixation.  I have reviewed the procedure as well as risks/benefits with the patient and family.  All questions were addressed.  No change in her clinical exam from last office visit  Plan on moving forward with planned procedure.

## 2018-11-27 NOTE — Anesthesia Procedure Notes (Signed)
Procedure Name: Intubation Date/Time: 11/27/2018 12:35 PM Performed by: Carney Living, CRNA Pre-anesthesia Checklist: Patient identified, Emergency Drugs available, Suction available, Patient being monitored and Timeout performed Patient Re-evaluated:Patient Re-evaluated prior to induction Oxygen Delivery Method: Circle system utilized Preoxygenation: Pre-oxygenation with 100% oxygen Induction Type: IV induction Laryngoscope Size: Mac and 4 Grade View: Grade I Tube type: Oral Tube size: 7.0 mm Number of attempts: 1 Airway Equipment and Method: Stylet Placement Confirmation: ETT inserted through vocal cords under direct vision,  positive ETCO2 and breath sounds checked- equal and bilateral Secured at: 21 cm Tube secured with: Tape Dental Injury: Teeth and Oropharynx as per pre-operative assessment

## 2018-11-28 ENCOUNTER — Encounter (HOSPITAL_COMMUNITY): Payer: Self-pay | Admitting: Orthopedic Surgery

## 2018-11-28 MED ORDER — MAGNESIUM CITRATE PO SOLN
1.0000 | Freq: Once | ORAL | Status: AC
  Administered 2018-11-28: 1 via ORAL
  Filled 2018-11-28: qty 296

## 2018-11-28 MED ORDER — HYDROXYZINE HCL 50 MG/ML IM SOLN
50.0000 mg | Freq: Four times a day (QID) | INTRAMUSCULAR | Status: DC | PRN
  Administered 2018-11-28: 50 mg via INTRAMUSCULAR
  Filled 2018-11-28: qty 1

## 2018-11-28 NOTE — Evaluation (Signed)
Occupational Therapy Evaluation Patient Details Name: [name] MRN: [id number] DOB: 10/14/1956 Today's Date: 11/28/2018    History of Present Illness Pt is a 62 y.o. F with significant PMH of CAD, HTN who presents s/p XLIF L3-5 and posterior spinal fusion (left) L3-5.    Clinical Impression   This 62 y/o female presents with the above. At baseline pt reports she is independent with ADLs and functional mobility. Pt limited this session due to pain and with increased lethargy. Pt requiring minA for room level mobility using RW, requiring intermittent cues to maintain eyes open. Pt currently requires minA for UB ADL, overall maxA for LB and toileting ADLs. Initiated education regarding back precautions, brace management, safety and compensatory strategies for performing ADLs and functional transfers. Will continue to monitor for pt progress as pt reports she may only have intermittent assist from family after return home. Pt will benefit from continued acute OT services and recommend post-acute rehab services after discharge to maximize her safety and independence with ADLs and mobility. Will follow.     Follow Up Recommendations  Home health OT;Supervision/Assistance - 24 hour(pending pt progress, recommend 24hr initially)    Equipment Recommendations  3 in 1 bedside commode           Precautions / Restrictions Precautions Precautions: Fall;Back Precaution Booklet Issued: Yes (comment) Precaution Comments: verbally reviewed by OT and provided written handout Required Braces or Orthoses: Spinal Brace Spinal Brace: Lumbar corset Restrictions Weight Bearing Restrictions: No      Mobility Bed Mobility Overal bed mobility: Needs Assistance Bed Mobility: Sit to Sidelying         Sit to sidelying: Supervision General bed mobility comments: sitting EOB upon arrival  Transfers Overall transfer level: Needs assistance Equipment used: Rolling walker (2 wheeled) Transfers: Sit  to/from Stand Sit to Stand: Min assist         General transfer comment: assist to rise and steady at RW, VCs hand placement    Balance Overall balance assessment: Needs assistance Sitting-balance support: Feet supported Sitting balance-Leahy Scale: Good     Standing balance support: Bilateral upper extremity supported Standing balance-Leahy Scale: Poor                             ADL either performed or assessed with clinical judgement   ADL Overall ADL's : Needs assistance/impaired Eating/Feeding: Modified independent;Sitting   Grooming: Set up;Min guard;Sitting   Upper Body Bathing: Min guard;Sitting   Lower Body Bathing: Moderate assistance;Sit to/from stand   Upper Body Dressing : Minimal assistance;Sitting Upper Body Dressing Details (indicate cue type and reason): brace management Lower Body Dressing: Moderate assistance;Maximal assistance;Sit to/from stand Lower Body Dressing Details (indicate cue type and reason): minA standing balance, increased assist for task completion Toilet Transfer: Minimal assistance;Ambulation;BSC;RW Toilet Transfer Details (indicate cue type and reason): BSC over toilet Toileting- Clothing Manipulation and Hygiene: Maximal assistance;Sit to/from stand Toileting - Clothing Manipulation Details (indicate cue type and reason): pt required assist for peri-care after voiding bladder and for clothing management     Functional mobility during ADLs: Minimal assistance;Rolling walker General ADL Comments: pt lethargic throughout session and requires cues to maintain eyes open     Vision         Perception     Praxis      Pertinent Vitals/Pain Pain Assessment: Faces Faces Pain Scale: Hurts even more Pain Location: surgical site, bil LEs Pain Descriptors / Indicators: Discomfort;Guarding;Sore Pain  Intervention(s): Monitored during session;Limited activity within patient's tolerance;Repositioned     Hand Dominance      Extremity/Trunk Assessment Upper Extremity Assessment Upper Extremity Assessment: Generalized weakness   Lower Extremity Assessment Lower Extremity Assessment: Defer to PT evaluation   Cervical / Trunk Assessment Cervical / Trunk Assessment: Other exceptions Cervical / Trunk Exceptions: s/p spinal surgery   Communication Communication Communication: No difficulties   Cognition Arousal/Alertness: Lethargic Behavior During Therapy: Flat affect Overall Cognitive Status: Impaired/Different from baseline Area of Impairment: Attention;Safety/judgement;Awareness                   Current Attention Level: Sustained     Safety/Judgement: Decreased awareness of safety Awareness: Emergent   General Comments: Pt lethargic throughout session and required frequent cueing to keep her eyes open; pt thought it was almost 9 in the evening when it was actually almost 9 in the AM   General Comments  pt with supplemental O2 in room this session, initially 91-92% on RA, decreased to 87% after ambulation to bathroom, reapplied 2L O2 for brief period and O2 sats returning to 95%. Use of RA for mobility back to EOB    Exercises     Shoulder Instructions      Home Living Family/patient expects to be discharged to:: Private residence Living Arrangements: Children(daughter, granddaughter) Available Help at Discharge: Family;Available PRN/intermittently Type of Home: Mobile home Home Access: Stairs to enter Entrance Stairs-Number of Steps: 5(5 steps to enter, then 2 more from porch) Entrance Stairs-Rails: Right;Left Home Layout: One level     Bathroom Shower/Tub: Teacher, early years/pre: Standard     Home Equipment: Environmental consultant - 4 wheels;Shower seat          Prior Functioning/Environment Level of Independence: Independent                 OT Problem List: Decreased strength;Decreased range of motion;Decreased activity tolerance;Impaired balance (sitting and/or  standing);Decreased safety awareness;Decreased knowledge of use of DME or AE;Decreased cognition;Pain      OT Treatment/Interventions: Self-care/ADL training;Neuromuscular education;Therapeutic exercise;DME and/or AE instruction;Therapeutic activities;Patient/family education;Balance training    OT Goals(Current goals can be found in the care plan section) Acute Rehab OT Goals Patient Stated Goal: "less pain." OT Goal Formulation: With patient Time For Goal Achievement: 12/12/18 Potential to Achieve Goals: Good  OT Frequency: Min 2X/week   Barriers to D/C:            Co-evaluation              AM-PAC OT "6 Clicks" Daily Activity     Outcome Measure Help from another person eating meals?: None Help from another person taking care of personal grooming?: A Little Help from another person toileting, which includes using toliet, bedpan, or urinal?: A Lot Help from another person bathing (including washing, rinsing, drying)?: A Lot Help from another person to put on and taking off regular upper body clothing?: A Little Help from another person to put on and taking off regular lower body clothing?: A Lot 6 Click Score: 16   End of Session Equipment Utilized During Treatment: Gait belt;Back brace;Rolling walker Nurse Communication: Mobility status;Other (comment)(requests nausea meds)  Activity Tolerance: Patient limited by lethargy;Patient limited by pain Patient left: Other (comment)(handoff to PT for session)  OT Visit Diagnosis: Unsteadiness on feet (R26.81);Muscle weakness (generalized) (M62.81);Pain Pain - part of body: (back)                Time: 1194-1740 OT Time Calculation (  min): 25 min Charges:  OT General Charges $OT Visit: 1 Visit OT Evaluation $OT Eval Low Complexity: 1 Low OT Treatments $Self Care/Home Management : 8-22 mins  Lou Cal, OT Supplemental Rehabilitation Services Pager [phone] Office [phone]   Raymondo Band 11/28/2018, 10:06 AM

## 2018-11-28 NOTE — Evaluation (Addendum)
Physical Therapy Evaluation Patient Details Name: [name] MRN: [id number] DOB: May 30, 1956 Today's Date: 11/28/2018   History of Present Illness  Pt is a 62 y.o. F with significant PMH of CAD, HTN who presents s/p XLIF L3-5 and posterior spinal fusion (left) L3-5.   Clinical Impression  Patient is s/p above surgery resulting in the deficits listed below (see PT Problem List). Evaluation limited by patient lethargy and complaint of nausea (RN notified). Pt requiring frequent cueing to keep her eyes open during the session. Ambulating 10 feet with walker and min guard assist for safety. SpO2 88-91% on RA, increased to 95% on 2L O2. Demonstrates decreased functional mobility secondary to back pain, balance deficits, decreased awareness/attention, and decreased activity tolerance. Suspecting patient will progress once more alert. Patient will benefit from skilled PT to increase their independence and safety with mobility (while adhering to their precautions) to allow discharge to the venue listed below.     Follow Up Recommendations Home health PT;Supervision for mobility/OOB    Equipment Recommendations  Rolling walker with 5" wheels;3in1 (PT)    Recommendations for Other Services       Precautions / Restrictions Precautions Precautions: Fall;Back Precaution Booklet Issued: Yes (comment) Precaution Comments: verbally reviewed by OT and provided written handout Required Braces or Orthoses: Spinal Brace Spinal Brace: Lumbar corset Restrictions Weight Bearing Restrictions: No      Mobility  Bed Mobility Overal bed mobility: Needs Assistance Bed Mobility: Sit to Sidelying         Sit to sidelying: Supervision General bed mobility comments: Increased time and effort but able to perform without physical assistance  Transfers Overall transfer level: Needs assistance Equipment used: Rolling walker (2 wheeled) Transfers: Sit to/from Stand Sit to Stand: Min guard;Min assist          General transfer comment: Min guard to min assist provided for transfers.   Ambulation/Gait Ambulation/Gait assistance: Min guard Gait Distance (Feet): 10 Feet Assistive device: Rolling walker (2 wheeled) Gait Pattern/deviations: Step-through pattern;Decreased stride length Gait velocity: decreased Gait velocity interpretation: <1.31 ft/sec, indicative of household ambulator General Gait Details: Very stiff and guarded gait pattern with slow cadence. Cues for scapular depression and walker proximity  Stairs            Wheelchair Mobility    Modified Rankin (Stroke Patients Only)       Balance Overall balance assessment: Needs assistance Sitting-balance support: Feet supported Sitting balance-Leahy Scale: Good     Standing balance support: Bilateral upper extremity supported Standing balance-Leahy Scale: Poor                               Pertinent Vitals/Pain Pain Assessment: Faces Faces Pain Scale: Hurts even more Pain Location: surgical site Pain Descriptors / Indicators: Discomfort;Guarding Pain Intervention(s): Monitored during session;Patient requesting pain meds-RN notified    Home Living Family/patient expects to be discharged to:: Private residence Living Arrangements: Children(daughter, granddaughter) Available Help at Discharge: Family;Available PRN/intermittently Type of Home: Mobile home Home Access: Stairs to enter Entrance Stairs-Rails: Right;Left Entrance Stairs-Number of Steps: 5(5 steps to enter, then 2 more from porch) Home Layout: One level Home Equipment: Walker - 4 wheels;Shower seat      Prior Function Level of Independence: Independent               Hand Dominance        Extremity/Trunk Assessment   Upper Extremity Assessment Upper Extremity Assessment: Defer to  OT evaluation    Lower Extremity Assessment Lower Extremity Assessment: Generalized weakness    Cervical / Trunk Assessment Cervical  / Trunk Assessment: Other exceptions Cervical / Trunk Exceptions: s/p spinal surgery  Communication   Communication: No difficulties  Cognition Arousal/Alertness: Lethargic Behavior During Therapy: Flat affect Overall Cognitive Status: Impaired/Different from baseline Area of Impairment: Attention;Safety/judgement;Awareness                   Current Attention Level: Sustained     Safety/Judgement: Decreased awareness of safety Awareness: Emergent   General Comments: Pt lethargic throughout session and required frequent cueing to keep her eyes open      General Comments      Exercises     Assessment/Plan    PT Assessment Patient needs continued PT services  PT Problem List Decreased strength;Decreased activity tolerance;Decreased balance;Decreased mobility;Decreased knowledge of use of DME;Pain       PT Treatment Interventions DME instruction;Gait training;Stair training;Functional mobility training;Therapeutic activities;Balance training;Therapeutic exercise;Patient/family education    PT Goals (Current goals can be found in the Care Plan section)  Acute Rehab PT Goals Patient Stated Goal: "less pain." PT Goal Formulation: With patient Time For Goal Achievement: 12/03/18 Potential to Achieve Goals: Good    Frequency Min 5X/week   Barriers to discharge Decreased caregiver support      Co-evaluation               AM-PAC PT "6 Clicks" Mobility  Outcome Measure Help needed turning from your back to your side while in a flat bed without using bedrails?: None Help needed moving from lying on your back to sitting on the side of a flat bed without using bedrails?: None Help needed moving to and from a bed to a chair (including a wheelchair)?: A Little Help needed standing up from a chair using your arms (e.g., wheelchair or bedside chair)?: A Little Help needed to walk in hospital room?: A Little Help needed climbing 3-5 steps with a railing? : A Lot 6  Click Score: 19    End of Session Equipment Utilized During Treatment: Gait belt;Back brace Activity Tolerance: Patient limited by lethargy Patient left: in bed;with call bell/phone within reach;with nursing/sitter in room Nurse Communication: Mobility status PT Visit Diagnosis: Unsteadiness on feet (R26.81);Difficulty in walking, not elsewhere classified (R26.2);Pain Pain - part of body: (back)    Time: 4132-4401 PT Time Calculation (min) (ACUTE ONLY): 12 min   Charges:   PT Evaluation $PT Eval Moderate Complexity: 1 Mod         [name], [place], DPT Acute Rehabilitation Services Pager [phone] Office [phone]   [name] 11/28/2018, 9:32 AM

## 2018-11-28 NOTE — Progress Notes (Signed)
    Subjective: Procedure(s) (LRB): XLIF Lumbar 3-5, posterior spinal fusion L3-5 (Left) 1 Day Post-Op  Patient reports pain as 4 on 0-10 scale.  Reports decreased leg pain reports incisional back pain   Positive void Negative bowel movement Positive flatus Negative chest pain or shortness of breath  Objective: Vital signs in last 24 hours: Temp:  [97 F (36.1 C)-98.9 F (37.2 C)] 97.8 F (36.6 C) (12/12 0744) Pulse Rate:  [76-95] 95 (12/12 0744) Resp:  [14-24] 18 (12/12 0744) BP: (124-164)/(55-89) 124/55 (12/12 0744) SpO2:  [89 %-100 %] 89 % (12/12 0744)  Intake/Output from previous day: 12/11 0701 - 12/12 0700 In: 3539.1 [P.O.:340; I.V.:2863.5; IV Piggyback:285.6] Out: 525 [Urine:425; Blood:100]  Labs: No results for input(s): WBC, RBC, HCT, PLT in the last 72 hours. No results for input(s): NA, K, CL, CO2, BUN, CREATININE, GLUCOSE, CALCIUM in the last 72 hours. No results for input(s): LABPT, INR in the last 72 hours.  Physical Exam: Neurologically intact ABD soft Intact pulses distally Incision: dressing C/D/I Compartment soft Body mass index is 32.72 kg/m.   Assessment/Plan: Patient stable  xrays n/a Continue mobilization with physical therapy Continue care  Advance diet Up with therapy  1. Mobilization with PT 2. Possible d/c Friday or Saturday 3. Continue care  Melina Schools, MD [facility] [phone]

## 2018-11-29 MED ORDER — GABAPENTIN 100 MG PO CAPS: 100.0000 mg | ORAL_CAPSULE | Freq: Every day | ORAL | 0 refills | Status: DC

## 2018-11-29 MED ORDER — METHOCARBAMOL 500 MG PO TABS
500.0000 mg | ORAL_TABLET | Freq: Three times a day (TID) | ORAL | Status: DC | PRN
  Administered 2018-11-29: 500 mg via ORAL
  Filled 2018-11-29: qty 1

## 2018-11-29 MED ORDER — TRAMADOL HCL 50 MG PO TABS
50.0000 mg | ORAL_TABLET | Freq: Four times a day (QID) | ORAL | Status: DC | PRN
  Administered 2018-11-29 – 2018-11-30 (×4): 50 mg via ORAL
  Filled 2018-11-29 (×4): qty 1

## 2018-11-29 MED ORDER — CLONAZEPAM 0.25 MG PO TBDP: 0.2500 mg | ORAL_TABLET | Freq: Two times a day (BID) | ORAL | Status: DC

## 2018-11-29 MED ORDER — CLONAZEPAM 1 MG PO TABS: 1.0000 mg | ORAL_TABLET | Freq: Every day | ORAL | 5 refills | Status: DC

## 2018-11-29 MED ORDER — OXYCODONE HCL 5 MG PO TABS: 5.0000 mg | ORAL_TABLET | Freq: Four times a day (QID) | ORAL | 0 refills | Status: AC | PRN

## 2018-11-29 MED ORDER — CLONAZEPAM 0.5 MG PO TABS: 0.2500 mg | ORAL_TABLET | Freq: Two times a day (BID) | ORAL | Status: DC

## 2018-11-29 MED ORDER — GABAPENTIN 100 MG PO CAPS
100.0000 mg | ORAL_CAPSULE | Freq: Every day | ORAL | Status: DC
  Administered 2018-11-29: 100 mg via ORAL
  Filled 2018-11-29: qty 1

## 2018-11-29 NOTE — Progress Notes (Addendum)
    Subjective: Procedure(s) (LRB): XLIF Lumbar 3-5, posterior spinal fusion L3-5 (Left) 2 Days Post-Op  Patient reports pain as 3 on 0-10 scale.  Reports decreased leg pain reports incisional back pain   Positive void Positive bowel movement Positive flatus Negative chest pain or shortness of breath  Objective: Vital signs in last 24 hours: Temp:  [97.9 F (36.6 C)-100.9 F (38.3 C)] 97.9 F (36.6 C) (12/13 0449) Pulse Rate:  [70-84] 84 (12/13 0449) Resp:  [17-20] 18 (12/13 0449) BP: (93-135)/(44-79) 119/79 (12/13 0449) SpO2:  [91 %-98 %] 91 % (12/13 0449)  Intake/Output from previous day: 12/12 0701 - 12/13 0700 In: 1000  Out: -   Labs: No results for input(s): WBC, RBC, HCT, PLT in the last 72 hours. No results for input(s): NA, K, CL, CO2, BUN, CREATININE, GLUCOSE, CALCIUM in the last 72 hours. No results for input(s): LABPT, INR in the last 72 hours.  Physical Exam: Neurologically intact ABD soft Intact pulses distally Incision: dressing C/D/I and no drainage Compartment soft Body mass index is 32.72 kg/m.   Assessment/Plan: Patient stable  xrays n/a Continue mobilization with physical therapy Continue care  Advance diet Up with therapy  1. Patient ambulating in the hall this AM - stable 2. Patient sedated but has not received significant amount of narcotic medications.  Will plan on tramadol for initial pain management and then progress to oxycodone for more significant pain.  3. No significant hip flexor weakness when ambulating 4. Plan on SNF vs home d/c tomorrow 5. Decreased clonopin dose as this could be causing sedation.     Melina Schools, MD [facility] [phone]  Addendum:  Agree with assessments of  OT/PT:  Patient not qualified for SNF will order [facility] pt/ot/aide.

## 2018-11-29 NOTE — Telephone Encounter (Signed)
Patient still admitted as of today. Will address next week  in case something changes while she is at the hospital.

## 2018-11-29 NOTE — Progress Notes (Signed)
Occupational Therapy Treatment Patient Details Name: [name] MRN: [id number] DOB: [birth year] Today's Date: 11/29/2018    History of present illness Pt is a 62 y.o. F with significant PMH of CAD, HTN who presents s/p XLIF L3-5 and posterior spinal fusion (left) L3-5.    OT comments  This 62 yo female admitted and underwent above presents to acute OT with making progress with overall mobility, but still needing increased A with basic ADLs even with AE. Feel she will increase independence with basic ADLs and use of AE but needs extensive practice. Pt will have limited support at home so feel SNF is best option at this point to get more independent with basic ADLs and be able to do some IADLs (ie: fix simple meals). She will continue to benefit from acute OT.  Follow Up Recommendations  SNF;Supervision/Assistance - 24 hour;Other (comment)(pt reluctant to SNF, if refuses then HHOT, HHAid with 24 hour S/prn A recommended)    Equipment Recommendations  3 in 1 bedside commode;Other (comment)(AE, toilet aid (pt made aware where to get these))       Precautions / Restrictions Precautions Precautions: Fall;Back Precaution Booklet Issued: Yes (comment) Precaution Comments: continued OT education from handout Spinal Brace: Lumbar corset;Applied in sitting position;Applied in standing position Restrictions Weight Bearing Restrictions: No       Mobility Bed Mobility               General bed mobility comments: Pt up headed towards door with RW upon my entering into room  Transfers Overall transfer level: Needs assistance Equipment used: Rolling walker (2 wheeled) Transfers: Sit to/from Stand Sit to Stand: Min guard         General transfer comment: VCs for safe hand placement    Balance Overall balance assessment: Needs assistance Sitting-balance support: Feet supported;No upper extremity supported Sitting balance-Leahy Scale: Good     Standing balance support: Single  extremity supported;During functional activity Standing balance-Leahy Scale: Poor                             ADL either performed or assessed with clinical judgement   ADL Overall ADL's : Needs assistance/impaired     Grooming: Min guard;Standing;Wash/dry hands               Lower Body Dressing: Moderate assistance;With adaptive equipment;Adhering to back precautions Lower Body Dressing Details (indicate cue type and reason): min guard A sit<>stand; started education on use of AE for LBD Toilet Transfer: Min guard;Ambulation;RW;BSC Toilet Transfer Details (indicate cue type and reason): over toilet   Toileting - Clothing Manipulation Details (indicate cue type and reason): Min guard A for clothing, total A for hygiene; educated on use of toilet tongs and gave her info on where to get them             Vision Patient Visual Report: No change from baseline            Cognition Arousal/Alertness: Lethargic Behavior During Therapy: Flat affect Overall Cognitive Status: Impaired/Different from baseline Area of Impairment: Safety/judgement                         Safety/Judgement: Decreased awareness of safety     General Comments: pt up with RW headed towards door when I entered room, when asked what she was doing she said getting ready to lay down--I told her she was going the wrong way.  Pertinent Vitals/ Pain       Pain Assessment: 0-10 Pain Score: 6  Pain Location: surgical site Pain Descriptors / Indicators: Discomfort;Guarding;Sore Pain Intervention(s): Monitored during session         Frequency  Min 3X/week        Progress Toward Goals  OT Goals(current goals can now be found in the care plan section)  Progress towards OT goals: Not progressing toward goals - comment(continuing education on use of AE and basic ADLs, but pt still needs more education and increased A)     Plan Discharge plan needs to be  updated       AM-PAC OT "6 Clicks" Daily Activity     Outcome Measure   Help from another person eating meals?: None Help from another person taking care of personal grooming?: A Little Help from another person toileting, which includes using toliet, bedpan, or urinal?: A Lot Help from another person bathing (including washing, rinsing, drying)?: A Lot Help from another person to put on and taking off regular upper body clothing?: A Little Help from another person to put on and taking off regular lower body clothing?: A Lot 6 Click Score: 16    End of Session Equipment Utilized During Treatment: Rolling walker;Back brace  OT Visit Diagnosis: Unsteadiness on feet (R26.81);Muscle weakness (generalized) (M62.81);Pain Pain - part of body: (back)   Activity Tolerance Patient tolerated treatment well   Patient Left (with PT ambulating)   Nurse Communication (feel she would beneifit from SNF)        Time: 6578-4696 OT Time Calculation (min): 33 min  Charges: OT General Charges $OT Visit: 1 Visit OT Treatments $Self Care/Home Management : 23-37 mins [name], OTR/L Acute [employer] Pager [phone] Office [phone]      Almon Register 11/29/2018, 9:59 AM

## 2018-11-29 NOTE — Care Management Note (Signed)
Case Management Note  Patient Details  Name: [name] MRN: [id number] Date of Birth: 01-Apr-1956  Subjective/Objective:  62 yr old female s/p XLIF L3-5 and posterior spinal fusion (left) L3-5.                   Action/Plan: Case manager spoke with patient concerning discharge plan. Choice for [place] was offered, referral was called to Neoma Laming, [place] Liaison. Patient will have family support at discharge.    Expected Discharge Date:  11/30/18               Expected Discharge Plan:  [place]  In-House Referral:  NA  Discharge planning Services  CM Consult  Post Acute Care Choice:  Home Health Choice offered to:  Patient  DME Arranged:  N/A DME Agency:  NA  HH Arranged:  PT/OT/NA HH Agency:  [place]  Status of Service:  Completed, signed off  If discussed at [place] of Stay Meetings, dates discussed:    Additional Comments:  Ninfa Meeker, RN 11/29/2018, 1:53 PM

## 2018-11-29 NOTE — Progress Notes (Signed)
Physical Therapy Treatment Patient Details Name: [name] MRN: [id number] DOB: 12/31/1955 Today's Date: 11/29/2018    History of Present Illness Pt is a 62 y.o. F with significant PMH of CAD, HTN who presents s/p XLIF L3-5 and posterior spinal fusion (left) L3-5.     PT Comments    Pt much improved with mobility from yesterday but per discussion with OT pt is struggling with self care and ADLs. Pt was actively texting with her daughter while we were finishing up treatment and pt stated her daughter thought she should probably go SNF as the daughter would not be there much during the day. PT will continue to work with pt and progress mobility while she is in the hospital.     Follow Up Recommendations  Home health PT;Supervision for mobility/OOB     Equipment Recommendations       Recommendations for Other Services       Precautions / Restrictions Precautions Precautions: Fall;Back Precaution Booklet Issued: Yes (comment) Precaution Comments: Able to state 3/3 back precautions Required Braces or Orthoses: Spinal Brace Spinal Brace: Lumbar corset;Applied in sitting position Restrictions Weight Bearing Restrictions: No    Mobility  Bed Mobility Overal bed mobility: Needs Assistance Bed Mobility: Sit to Sidelying         Sit to sidelying: Supervision General bed mobility comments: (increased time to get legs into bed)  Transfers Overall transfer level: Needs assistance Equipment used: Rolling walker (2 wheeled) Transfers: Sit to/from Stand Sit to Stand: Min guard         General transfer comment: VCs for safe hand placement  Ambulation/Gait Ambulation/Gait assistance: Min guard Gait Distance (Feet): 120 Feet Assistive device: Rolling walker (2 wheeled) Gait Pattern/deviations: Step-through pattern;Decreased stride length Gait velocity: decreased   General Gait Details: Slow cadence. Cues for walker proximity. Distance limited by back pain   Stairs              Wheelchair Mobility    Modified Rankin (Stroke Patients Only)          Cognition Arousal/Alertness: Awake/alert Behavior During Therapy: WFL for tasks assessed/performed Overall Cognitive Status: Within Functional Limits for tasks assessed Area of Impairment: Safety/judgement                              Exercises      General Comments General comments (skin integrity, edema, etc.): Discussed with pt option of home vs. potential SNF stay. Encouraged pt to get up throughout the day today with nursing staff to better gauge her mobility and activity tolerance.       Pertinent Vitals/Pain Pain Assessment: 0-10 Pain Score: 6  Pain Location: low back Pain Descriptors / Indicators: Discomfort;Guarding;Sore Pain Intervention(s): Limited activity within patient's tolerance;Monitored during session                                      PT Goals (current goals can now be found in the care plan section) Acute Rehab PT Goals Patient Stated Goal: Pt deciding on home vs SNF Progress towards PT goals: Progressing toward goals    Frequency    Min 5X/week      PT Plan Other (comment)(Pt deciding if she can go home with limited support)                  AM-PAC PT "6 Clicks"  Mobility   Outcome Measure  Help needed turning from your back to your side while in a flat bed without using bedrails?: None Help needed moving from lying on your back to sitting on the side of a flat bed without using bedrails?: None Help needed moving to and from a bed to a chair (including a wheelchair)?: A Little Help needed standing up from a chair using your arms (e.g., wheelchair or bedside chair)?: A Little Help needed to walk in hospital room?: A Little Help needed climbing 3-5 steps with a railing? : A Lot 6 Click Score: 19    End of Session Equipment Utilized During Treatment: Gait belt;Back brace Activity Tolerance: Patient limited by  pain Patient left: in bed;with call bell/phone within reach Nurse Communication: Mobility status;Other (comment)(Pt at 92% oxygen sats on RA. ) PT Visit Diagnosis: Unsteadiness on feet (R26.81);Difficulty in walking, not elsewhere classified (R26.2)     Time: 2919-1660 PT Time Calculation (min) (ACUTE ONLY): 29 min  Charges:  $Gait Training: 23-37 mins                     Lavonia Dana, PT   Acute Rehabilitation Services  Pager [phone] Office [phone] 11/29/2018    Melvern Banker 11/29/2018, 10:21 AM

## 2018-11-30 NOTE — Progress Notes (Signed)
Physical Therapy Treatment Patient Details Name: [name] MRN: [id number] DOB: [birth year] Today's Date: 11/30/2018    History of Present Illness Pt is a 62 y.o. F with significant PMH of CAD, HTN who presents s/p XLIF L3-5 and posterior spinal fusion (left) L3-5.     PT Comments    Pt progressing slowly towards physical therapy goals. Was able to perform transfers and ambulation with gross min guard assist with intermittent supervision for safety with rollator. Pt's daughter present at end of session and reporting concern that pt will be home alone during the day. Pt and daughter were educated on increased safety awareness with keeping rollator close at all times (tends to push it away when preparing to sit) and locked during transfers, car transfers, activity progression, and precautions. Pt also reports dogs sleep under the covers with her at home, and infection prevention education was also provided. Continue to feel that [place] will be beneficial to maximize functional independence and decrease risk for falls at home.   Follow Up Recommendations  Home health PT;Supervision for mobility/OOB     Equipment Recommendations  Rolling walker with 5" wheels;3in1 (PT)    Recommendations for Other Services       Precautions / Restrictions Precautions Precautions: Fall;Back Precaution Booklet Issued: Yes (comment) Precaution Comments: Able to state 3/3 back precautions however requires intermittent cues to maintain Required Braces or Orthoses: Spinal Brace Spinal Brace: Lumbar corset;Applied in sitting position Restrictions Weight Bearing Restrictions: No    Mobility  Bed Mobility Overal bed mobility: Needs Assistance Bed Mobility: Sidelying to Sit   Sidelying to sit: Supervision       General bed mobility comments: hob flat, no rail, exited on R side.  Transfers Overall transfer level: Needs assistance Equipment used: Rolling walker (2 wheeled) Transfers: Sit to/from  Stand Sit to Stand: Supervision Stand pivot transfers: Supervision       General transfer comment: VC's for safe hand placement on seated surface for safety. No assist required however increased time and effort noted.   Ambulation/Gait Ambulation/Gait assistance: Min guard;Supervision Gait Distance (Feet): 75 Feet Assistive device: 4-wheeled walker Gait Pattern/deviations: Step-through pattern;Decreased stride length Gait velocity: decreased Gait velocity interpretation: <1.8 ft/sec, indicate of risk for recurrent falls General Gait Details: Slow yet generally steady. 2 seated rest breaks taken throughout gait training due to reported feeling of LE weakness and fatigue.    Stairs             Wheelchair Mobility    Modified Rankin (Stroke Patients Only)       Balance Overall balance assessment: Needs assistance Sitting-balance support: Feet supported;No upper extremity supported Sitting balance-Leahy Scale: Good     Standing balance support: Single extremity supported;During functional activity Standing balance-Leahy Scale: Poor                              Cognition Arousal/Alertness: Awake/alert Behavior During Therapy: WFL for tasks assessed/performed Overall Cognitive Status: Within Functional Limits for tasks assessed Area of Impairment: Safety/judgement                   Current Attention Level: Sustained     Safety/Judgement: Decreased awareness of safety            Exercises      General Comments        Pertinent Vitals/Pain Pain Assessment: Faces Pain Score: 5  Faces Pain Scale: Hurts even more Pain Location: low  back, LLE Pain Descriptors / Indicators: Aching Pain Intervention(s): Monitored during session    Home Living                      Prior Function            PT Goals (current goals can now be found in the care plan section) Acute Rehab PT Goals Patient Stated Goal: Be safe at home during the  day when she will be alone PT Goal Formulation: With patient Time For Goal Achievement: 12/03/18 Potential to Achieve Goals: Good Progress towards PT goals: Progressing toward goals    Frequency    Min 5X/week      PT Plan Other (comment)(Pt deciding if she can go home with limited support)    Co-evaluation              AM-PAC PT "6 Clicks" Mobility   Outcome Measure  Help needed turning from your back to your side while in a flat bed without using bedrails?: None Help needed moving from lying on your back to sitting on the side of a flat bed without using bedrails?: None Help needed moving to and from a bed to a chair (including a wheelchair)?: A Little Help needed standing up from a chair using your arms (e.g., wheelchair or bedside chair)?: A Little Help needed to walk in hospital room?: A Little Help needed climbing 3-5 steps with a railing? : A Lot 6 Click Score: 19    End of Session Equipment Utilized During Treatment: Gait belt;Back brace Activity Tolerance: Patient limited by pain Patient left: in bed;with call bell/phone within reach Nurse Communication: Mobility status PT Visit Diagnosis: Unsteadiness on feet (R26.81);Difficulty in walking, not elsewhere classified (R26.2) Pain - part of body: (back)     Time: 1610-9604 PT Time Calculation (min) (ACUTE ONLY): 26 min  Charges:  $Gait Training: 23-37 mins                     [name], PT, DPT Acute Rehabilitation Services Pager: [phone] Office: [phone]    [name] 11/30/2018, 9:00 AM

## 2018-11-30 NOTE — Discharge Summary (Signed)
Orthopedic Discharge Summary        Physician Discharge Summary  Patient ID: [name] MRN: [id number] DOB/AGE: 04-06-1956 62 y.o.  Admit date: 11/27/2018 Discharge date: 11/30/2018   Procedures:  Procedure(s) (LRB): XLIF Lumbar 3-5, posterior spinal fusion L3-5 (Left)  Attending Physician:  Dr. Melina Schools   Admission Diagnoses:   Lumbar pain  Discharge Diagnoses: degenerative disc disease    Past Medical History:  Diagnosis Date  . Agoraphobia without history of panic disorder   . Anginal pain ([place])   . Anxiety   . Arthritis    "qwhere" (04/03/2018)  . Childhood asthma   . Chronic bronchitis ([place])   . Chronic lower back pain   . COPD (chronic obstructive pulmonary disease) ([place])   . Coronary artery disease CARDIOLOGIST- DR  Doylene Canard  (VISIT 03-30-11 W/ CHART)   NON-OBS. CAD   (STRESS TEST NOV. 2011  . Daily headache   . DDD (degenerative disc disease)   . Depression   . DJD (degenerative joint disease)    JOINT PAIN  . Dyspnea    "anytime but mostly on exertion and smoking"  . Fibromyalgia   . Frequency of urination   . GERD (gastroesophageal reflux disease) AND HIATIAL HERNIA   CONTROLLED W/ NEXIUM  . Heart murmur   . Hemorrhoids   . High cholesterol   . History of blood transfusion    "related to anemia" (04/03/2018)  . History of gastric ulcer 2004  . History of hiatal hernia   . Hypertension   . IBS (irritable bowel syndrome)   . Incisional pain s/p interstim implant 1st stage--- 12-07-11    left upper buttock-- pt states dressing clean dry and intact (on 12-08-11)  . Insomnia   . Iron deficiency anemia   . Nocturia   . Non-productive cough   . Numbness in both hands AT TIMES  . OSA (obstructive sleep apnea)    "suppose to wear mask; I don't" (04/03/2018)  . Urge urinary incontinence     PCP: Maurice Small, MD   Discharged Condition: good  Hospital Course:  Patient underwent the above stated procedure on 11/27/2018. Patient tolerated the  procedure well and brought to the recovery room in good condition and subsequently to the floor. Patient had an uncomplicated hospital course and was stable for discharge.   Disposition: Discharge disposition: 01-Home or Self Care      with follow up in 2 weeks   [place], Dahari, MD In 2 weeks.   Specialty:  Orthopedic Surgery Why:  If symptoms worsen, For suture removal, For wound re-check Contact information: [address] [place] [address] [phone]        Health, [facility]-Home Follow up.   Specialty:  [place] Why:  A representative from [facility] will contact you to arrange start date and time for your therapy. Contact information: [address] [place] [address] [phone]           Discharge Instructions    Call MD / Call 911   Complete by:  As directed    If you experience chest pain or shortness of breath, CALL 911 and be transported to the hospital emergency room.  If you develope a fever above 101 F, pus (white drainage) or increased drainage or redness at the wound, or calf pain, call your surgeon's office.   Constipation Prevention   Complete by:  As directed  Drink plenty of fluids.  Prune juice may be helpful.  You may use a stool softener, such as Colace (over the counter) 100 mg twice a day.  Use MiraLax (over the counter) for constipation as needed.   Diet - low sodium heart healthy   Complete by:  As directed    Incentive spirometry RT   Complete by:  As directed    Increase activity slowly as tolerated   Complete by:  As directed       Allergies as of 11/30/2018      Reactions   Midazolam Hcl Other (See Comments)   HYPER   Prednisone Other (See Comments)   HYPER, depressed, moody   Statins Other (See Comments)   Causes muscle weakness and joint pain   Ace Inhibitors Other (See Comments)   DECREASES HEARTRATE   Amoxicillin Diarrhea   Nsaids  Other (See Comments)   AVOIDS; HX GASTRIC ULCER   Sulfa Antibiotics Rash, Other (See Comments)   JOINT PAIN      Medication List    STOP taking these medications   acetaminophen 650 MG CR tablet Commonly known as:  TYLENOL   aspirin 81 MG EC tablet   tiZANidine 4 MG tablet Commonly known as:  ZANAFLEX   vitamin C 1000 MG tablet     TAKE these medications   albuterol 108 (90 Base) MCG/ACT inhaler Commonly known as:  PROVENTIL HFA;VENTOLIN HFA Inhale 2 puffs into the lungs every 6 (six) hours as needed for wheezing or shortness of breath.   buPROPion 300 MG 24 hr tablet Commonly known as:  WELLBUTRIN XL Take 300 mg by mouth daily.   cholecalciferol 1000 units tablet Commonly known as:  VITAMIN D Take 1,000 Units by mouth daily.   clonazePAM 1 MG tablet Commonly known as:  KLONOPIN Take 1 tablet (1 mg total) by mouth at bedtime. What changed:  when to take this   diltiazem 120 MG 24 hr capsule Commonly known as:  CARDIZEM CD Take 1 capsule (120 mg total) by mouth daily.   DULoxetine 60 MG capsule Commonly known as:  CYMBALTA Take 60 mg by mouth at bedtime.   fenofibrate 48 MG tablet Commonly known as:  TRICOR Take 48 mg by mouth daily.   ferrous sulfate 325 (65 FE) MG tablet Take 325 mg by mouth daily with breakfast.   gabapentin 100 MG capsule Commonly known as:  NEURONTIN Take 1 capsule (100 mg total) by mouth at bedtime. What changed:  when to take this   hydrALAZINE 25 MG tablet Commonly known as:  APRESOLINE Take 25 mg by mouth 2 (two) times daily.   Magnesium 500 MG Caps Take 500 mg by mouth daily.   methocarbamol 500 MG tablet Commonly known as:  ROBAXIN Take 1 tablet (500 mg total) by mouth every 8 (eight) hours as needed for muscle spasms.   montelukast 10 MG tablet Commonly known as:  SINGULAIR Take 10 mg by mouth at bedtime.   nitroGLYCERIN 0.4 MG SL tablet Commonly known as:  NITROSTAT Place 1 tablet (0.4 mg total) under the tongue  every 5 (five) minutes x 3 doses as needed for chest pain.   ondansetron 4 MG tablet Commonly known as:  ZOFRAN Take 1 tablet (4 mg total) by mouth every 8 (eight) hours as needed for nausea or vomiting.   oxyCODONE 5 MG immediate release tablet Commonly known as:  Oxy IR/ROXICODONE Take 1 tablet (5 mg total) by mouth every 6 (six) hours as  needed for up to 5 days for moderate pain ((score 4 to 6)).   pantoprazole 40 MG tablet Commonly known as:  PROTONIX Take 1 tablet (40 mg total) by mouth daily as needed. What changed:  when to take this   rosuvastatin 10 MG tablet Commonly known as:  CRESTOR TAKE 1 TABLET BY MOUTH EVERY DAY   SUPER B COMPLEX/C PO Take 1 tablet by mouth daily.         Signed: Ventura Bruns 11/30/2018, 7:41 AM  [facility] is now [employer] [address] [place], [place], [place] [address] Phone: [place]

## 2018-11-30 NOTE — Progress Notes (Signed)
Occupational Therapy Treatment Patient Details Name: [name] MRN: [id number] DOB: [birth year] Today's Date: 11/30/2018    History of present illness Pt is a 62 y.o. F with significant PMH of CAD, HTN who presents s/p XLIF L3-5 and posterior spinal fusion (left) L3-5.    OT comments  Pt. Making gains with skilled OT. Able to utilize A/E for LB dressing and peri care today with S.  Bed mobility with S.    Follow Up Recommendations  SNF;Supervision/Assistance - 24 hour;Other (comment)    Equipment Recommendations  3 in 1 bedside commode;Other (comment)    Recommendations for Other Services      Precautions / Restrictions Precautions Precautions: Fall;Back Required Braces or Orthoses: Spinal Brace Spinal Brace: Lumbar corset;Applied in sitting position       Mobility Bed Mobility Overal bed mobility: Needs Assistance Bed Mobility: Sidelying to Sit   Sidelying to sit: Supervision       General bed mobility comments: hob flat, no rail, exited on L side  Transfers Overall transfer level: Needs assistance Equipment used: Rolling walker (2 wheeled) Transfers: Sit to/from [facility] Sit to Stand: Supervision Stand pivot transfers: Supervision            Balance                                           ADL either performed or assessed with clinical judgement   ADL Overall ADL's : Needs assistance/impaired     Grooming: Standing;Supervision/safety           Upper Body Dressing : Set up;Sitting;Standing Upper Body Dressing Details (indicate cue type and reason): brace management Lower Body Dressing: Min guard;With adaptive equipment;Cueing for sequencing;Sitting/lateral leans;Sit to/from stand Lower Body Dressing Details (indicate cue type and reason): return demo of reacher, and sock aide-noted improvement in technique from previous sessions.  Toilet Transfer: Geophysical data processor  Details (indicate cue type and reason): over toilet Toileting- Clothing Manipulation and Hygiene: Sit to/from stand;Supervision/safety;With adaptive equipment Toileting - Clothing Manipulation Details (indicate cue type and reason): Min guard A for clothing, used tongs for for front peri care     Functional mobility during ADLs: Supervision/safety;Rolling walker General ADL Comments: pt. awake and alert, states she is feeling much better today. noted improvement with use of A/E from previous session     Vision       Perception     Praxis      Cognition Arousal/Alertness: Awake/alert Behavior During Therapy: WFL for tasks assessed/performed Overall Cognitive Status: Within Functional Limits for tasks assessed                                          Exercises     Shoulder Instructions       General Comments      Pertinent Vitals/ Pain       Pain Assessment: 0-10 Pain Score: 5  Pain Location: low back, LLE Pain Descriptors / Indicators: Aching Pain Intervention(s): Monitored during session;Repositioned  Home Living                                          Prior Functioning/Environment  Frequency  Min 3X/week        Progress Toward Goals  OT Goals(current goals can now be found in the care plan section)  Progress towards OT goals: Progressing toward goals     Plan Discharge plan remains appropriate    Co-evaluation                 AM-PAC OT "6 Clicks" Daily Activity     Outcome Measure   Help from another person eating meals?: None Help from another person taking care of personal grooming?: A Little Help from another person toileting, which includes using toliet, bedpan, or urinal?: A Lot Help from another person bathing (including washing, rinsing, drying)?: A Lot Help from another person to put on and taking off regular upper body clothing?: A Little Help from another person to put on and  taking off regular lower body clothing?: A Lot 6 Click Score: 16    End of Session Equipment Utilized During Treatment: Rolling walker;Back brace;Other (comment)(A/E)  OT Visit Diagnosis: Unsteadiness on feet (R26.81);Muscle weakness (generalized) (M62.81);Pain   Activity Tolerance Patient tolerated treatment well   Patient Left in chair;with call bell/phone within reach;Other (comment)(md in room with pt. )   Nurse Communication          Time: 6122-4497 OT Time Calculation (min): 15 min  Charges: OT General Charges $OT Visit: 1 Visit OT Treatments $Self Care/Home Management : 8-22 mins   Janice Coffin, COTA/L 11/30/2018, 8:01 AM

## 2018-11-30 NOTE — Plan of Care (Signed)
  Problem: Education: Goal: Ability to verbalize activity precautions or restrictions will improve Outcome: Progressing Goal: Knowledge of the prescribed therapeutic regimen will improve Outcome: Progressing   Problem: Activity: Goal: Will remain free from falls Outcome: Progressing   Problem: Clinical Measurements: Goal: Postoperative complications will be avoided or minimized Outcome: Progressing   Problem: Bladder/Genitourinary: Goal: Urinary functional status for postoperative course will improve Outcome: Progressing   Problem: Education: Goal: Knowledge of General Education information will improve Description Including pain rating scale, medication(s)/side effects and non-pharmacologic comfort measures Outcome: Progressing   Problem: Clinical Measurements: Goal: Will remain free from infection Outcome: Progressing

## 2018-11-30 NOTE — Progress Notes (Signed)
   Subjective: 3 Days Post-Op Procedure(s) (LRB): XLIF Lumbar 3-5, posterior spinal fusion L3-5 (Left)  Pt doing well Residual pain in the left hip area but overall improved Working well with therapy  Ready for d/c Patient reports pain as mild.  Objective:   VITALS:   Vitals:   11/29/18 1935 11/30/18 0613  BP: (!) 169/95 133/79  Pulse: 85 81  Resp: 18   Temp: 99.9 F (37.7 C)   SpO2: 95%     Lumbar spine incision healing well nv intact distally Good rom of bilateral lower extremities No rashes or edema  LABS No results for input(s): HGB, HCT, WBC, PLT in the last 72 hours.  No results for input(s): NA, K, BUN, CREATININE, GLUCOSE in the last 72 hours.   Assessment/Plan: 3 Days Post-Op Procedure(s) (LRB): XLIF Lumbar 3-5, posterior spinal fusion L3-5 (Left) D/c home today F/u in 2 weeks Pain management as needed   Kathrynn Speed, [facility] is now [facility] [address] [place], [place], [place] [address] Phone: [phone] [email or web address] Facebook  [employer]

## 2018-11-30 NOTE — Progress Notes (Signed)
Pt doing well. Pt and daughter given D/C instructions with Rx's, verbal understanding was provided. Pt's incisions are clean and dry with no sign of infection. Pt's IV was removed prior to D/C. Pt D/C'd home via wheelchair @ 0945 per MD order. Home Health was arranged by CM per MD order. Pt received 3-n-1 from [facility] prior to D/C. Pt is stable @ D/C and has no other needs at this time. Holli Humbles, RN

## 2018-12-01 DIAGNOSIS — Z4789 Encounter for other orthopedic aftercare: Secondary | ICD-10-CM | POA: Diagnosis not present

## 2018-12-01 DIAGNOSIS — I2510 Atherosclerotic heart disease of native coronary artery without angina pectoris: Secondary | ICD-10-CM | POA: Diagnosis not present

## 2018-12-01 DIAGNOSIS — J449 Chronic obstructive pulmonary disease, unspecified: Secondary | ICD-10-CM | POA: Diagnosis not present

## 2018-12-01 DIAGNOSIS — Z6832 Body mass index (BMI) 32.0-32.9, adult: Secondary | ICD-10-CM | POA: Diagnosis not present

## 2018-12-01 DIAGNOSIS — I10 Essential (primary) hypertension: Secondary | ICD-10-CM | POA: Diagnosis not present

## 2018-12-01 DIAGNOSIS — M797 Fibromyalgia: Secondary | ICD-10-CM | POA: Diagnosis not present

## 2018-12-01 DIAGNOSIS — K589 Irritable bowel syndrome without diarrhea: Secondary | ICD-10-CM | POA: Diagnosis not present

## 2018-12-01 DIAGNOSIS — F329 Major depressive disorder, single episode, unspecified: Secondary | ICD-10-CM | POA: Diagnosis not present

## 2018-12-01 DIAGNOSIS — D509 Iron deficiency anemia, unspecified: Secondary | ICD-10-CM | POA: Diagnosis not present

## 2018-12-02 MED ORDER — ATORVASTATIN CALCIUM 20 MG PO TABS: 20.0000 mg | ORAL_TABLET | Freq: Every day | ORAL | 4 refills | Status: DC

## 2018-12-02 NOTE — Telephone Encounter (Signed)
Spoke with patient and she agrees to try atorvastatin since it is cheaper. She requested that only a thirty day supply be sent in at this time so that she can see how she tolerates this medication. She will contact the office if she has any further questions or concerns or if she decides to have the rx quantity changed to ninety. Patient aware that I will send this in as requested.

## 2018-12-02 NOTE — Addendum Note (Signed)
Addended by: Derl Barrow on: 12/02/2018 02:39 PM   Modules accepted: Orders

## 2018-12-04 DIAGNOSIS — M797 Fibromyalgia: Secondary | ICD-10-CM | POA: Diagnosis not present

## 2018-12-04 DIAGNOSIS — I10 Essential (primary) hypertension: Secondary | ICD-10-CM | POA: Diagnosis not present

## 2018-12-04 DIAGNOSIS — Z4789 Encounter for other orthopedic aftercare: Secondary | ICD-10-CM | POA: Diagnosis not present

## 2018-12-04 DIAGNOSIS — D509 Iron deficiency anemia, unspecified: Secondary | ICD-10-CM | POA: Diagnosis not present

## 2018-12-04 DIAGNOSIS — J449 Chronic obstructive pulmonary disease, unspecified: Secondary | ICD-10-CM | POA: Diagnosis not present

## 2018-12-04 DIAGNOSIS — I2510 Atherosclerotic heart disease of native coronary artery without angina pectoris: Secondary | ICD-10-CM | POA: Diagnosis not present

## 2018-12-05 DIAGNOSIS — Z4789 Encounter for other orthopedic aftercare: Secondary | ICD-10-CM | POA: Diagnosis not present

## 2018-12-05 DIAGNOSIS — I2510 Atherosclerotic heart disease of native coronary artery without angina pectoris: Secondary | ICD-10-CM | POA: Diagnosis not present

## 2018-12-05 DIAGNOSIS — M797 Fibromyalgia: Secondary | ICD-10-CM | POA: Diagnosis not present

## 2018-12-05 DIAGNOSIS — I10 Essential (primary) hypertension: Secondary | ICD-10-CM | POA: Diagnosis not present

## 2018-12-05 DIAGNOSIS — J449 Chronic obstructive pulmonary disease, unspecified: Secondary | ICD-10-CM | POA: Diagnosis not present

## 2018-12-05 DIAGNOSIS — D509 Iron deficiency anemia, unspecified: Secondary | ICD-10-CM | POA: Diagnosis not present

## 2018-12-09 DIAGNOSIS — D509 Iron deficiency anemia, unspecified: Secondary | ICD-10-CM | POA: Diagnosis not present

## 2018-12-09 DIAGNOSIS — Z4789 Encounter for other orthopedic aftercare: Secondary | ICD-10-CM | POA: Diagnosis not present

## 2018-12-09 DIAGNOSIS — M797 Fibromyalgia: Secondary | ICD-10-CM | POA: Diagnosis not present

## 2018-12-09 DIAGNOSIS — I2510 Atherosclerotic heart disease of native coronary artery without angina pectoris: Secondary | ICD-10-CM | POA: Diagnosis not present

## 2018-12-09 DIAGNOSIS — J449 Chronic obstructive pulmonary disease, unspecified: Secondary | ICD-10-CM | POA: Diagnosis not present

## 2018-12-09 DIAGNOSIS — I10 Essential (primary) hypertension: Secondary | ICD-10-CM | POA: Diagnosis not present

## 2018-12-12 DIAGNOSIS — Z4789 Encounter for other orthopedic aftercare: Secondary | ICD-10-CM | POA: Diagnosis not present

## 2018-12-12 DIAGNOSIS — D509 Iron deficiency anemia, unspecified: Secondary | ICD-10-CM | POA: Diagnosis not present

## 2018-12-12 DIAGNOSIS — I2510 Atherosclerotic heart disease of native coronary artery without angina pectoris: Secondary | ICD-10-CM | POA: Diagnosis not present

## 2018-12-12 DIAGNOSIS — I10 Essential (primary) hypertension: Secondary | ICD-10-CM | POA: Diagnosis not present

## 2018-12-12 DIAGNOSIS — M797 Fibromyalgia: Secondary | ICD-10-CM | POA: Diagnosis not present

## 2018-12-12 DIAGNOSIS — J449 Chronic obstructive pulmonary disease, unspecified: Secondary | ICD-10-CM | POA: Diagnosis not present

## 2018-12-13 DIAGNOSIS — Z4789 Encounter for other orthopedic aftercare: Secondary | ICD-10-CM | POA: Diagnosis not present

## 2018-12-13 DIAGNOSIS — D509 Iron deficiency anemia, unspecified: Secondary | ICD-10-CM | POA: Diagnosis not present

## 2018-12-13 DIAGNOSIS — M797 Fibromyalgia: Secondary | ICD-10-CM | POA: Diagnosis not present

## 2018-12-13 DIAGNOSIS — I2510 Atherosclerotic heart disease of native coronary artery without angina pectoris: Secondary | ICD-10-CM | POA: Diagnosis not present

## 2018-12-13 DIAGNOSIS — J449 Chronic obstructive pulmonary disease, unspecified: Secondary | ICD-10-CM | POA: Diagnosis not present

## 2018-12-13 DIAGNOSIS — I10 Essential (primary) hypertension: Secondary | ICD-10-CM | POA: Diagnosis not present

## 2018-12-17 DIAGNOSIS — J449 Chronic obstructive pulmonary disease, unspecified: Secondary | ICD-10-CM | POA: Diagnosis not present

## 2018-12-17 DIAGNOSIS — D509 Iron deficiency anemia, unspecified: Secondary | ICD-10-CM | POA: Diagnosis not present

## 2018-12-17 DIAGNOSIS — I2510 Atherosclerotic heart disease of native coronary artery without angina pectoris: Secondary | ICD-10-CM | POA: Diagnosis not present

## 2018-12-17 DIAGNOSIS — Z4789 Encounter for other orthopedic aftercare: Secondary | ICD-10-CM | POA: Diagnosis not present

## 2018-12-17 DIAGNOSIS — I10 Essential (primary) hypertension: Secondary | ICD-10-CM | POA: Diagnosis not present

## 2018-12-17 DIAGNOSIS — M797 Fibromyalgia: Secondary | ICD-10-CM | POA: Diagnosis not present

## 2018-12-20 DIAGNOSIS — Z4789 Encounter for other orthopedic aftercare: Secondary | ICD-10-CM | POA: Diagnosis not present

## 2018-12-20 DIAGNOSIS — I2510 Atherosclerotic heart disease of native coronary artery without angina pectoris: Secondary | ICD-10-CM | POA: Diagnosis not present

## 2018-12-20 DIAGNOSIS — J449 Chronic obstructive pulmonary disease, unspecified: Secondary | ICD-10-CM | POA: Diagnosis not present

## 2018-12-20 DIAGNOSIS — D509 Iron deficiency anemia, unspecified: Secondary | ICD-10-CM | POA: Diagnosis not present

## 2018-12-20 DIAGNOSIS — I10 Essential (primary) hypertension: Secondary | ICD-10-CM | POA: Diagnosis not present

## 2018-12-20 DIAGNOSIS — M797 Fibromyalgia: Secondary | ICD-10-CM | POA: Diagnosis not present

## 2018-12-23 DIAGNOSIS — M797 Fibromyalgia: Secondary | ICD-10-CM | POA: Diagnosis not present

## 2018-12-23 DIAGNOSIS — D509 Iron deficiency anemia, unspecified: Secondary | ICD-10-CM | POA: Diagnosis not present

## 2018-12-23 DIAGNOSIS — I10 Essential (primary) hypertension: Secondary | ICD-10-CM | POA: Diagnosis not present

## 2018-12-23 DIAGNOSIS — J449 Chronic obstructive pulmonary disease, unspecified: Secondary | ICD-10-CM | POA: Diagnosis not present

## 2018-12-23 DIAGNOSIS — I2510 Atherosclerotic heart disease of native coronary artery without angina pectoris: Secondary | ICD-10-CM | POA: Diagnosis not present

## 2018-12-23 DIAGNOSIS — Z4789 Encounter for other orthopedic aftercare: Secondary | ICD-10-CM | POA: Diagnosis not present

## 2018-12-31 DIAGNOSIS — M797 Fibromyalgia: Secondary | ICD-10-CM | POA: Diagnosis not present

## 2018-12-31 DIAGNOSIS — J449 Chronic obstructive pulmonary disease, unspecified: Secondary | ICD-10-CM | POA: Diagnosis not present

## 2018-12-31 DIAGNOSIS — D509 Iron deficiency anemia, unspecified: Secondary | ICD-10-CM | POA: Diagnosis not present

## 2018-12-31 DIAGNOSIS — I2510 Atherosclerotic heart disease of native coronary artery without angina pectoris: Secondary | ICD-10-CM | POA: Diagnosis not present

## 2018-12-31 DIAGNOSIS — Z4789 Encounter for other orthopedic aftercare: Secondary | ICD-10-CM | POA: Diagnosis not present

## 2018-12-31 DIAGNOSIS — I10 Essential (primary) hypertension: Secondary | ICD-10-CM | POA: Diagnosis not present

## 2019-01-03 DIAGNOSIS — M5136 Other intervertebral disc degeneration, lumbar region: Secondary | ICD-10-CM | POA: Diagnosis not present

## 2019-01-03 DIAGNOSIS — M4316 Spondylolisthesis, lumbar region: Secondary | ICD-10-CM | POA: Diagnosis not present

## 2019-01-14 DIAGNOSIS — Z4889 Encounter for other specified surgical aftercare: Secondary | ICD-10-CM | POA: Diagnosis not present

## 2019-01-21 DIAGNOSIS — I10 Essential (primary) hypertension: Secondary | ICD-10-CM | POA: Diagnosis not present

## 2019-01-21 DIAGNOSIS — M545 Low back pain: Secondary | ICD-10-CM | POA: Diagnosis not present

## 2019-01-21 DIAGNOSIS — R072 Precordial pain: Secondary | ICD-10-CM | POA: Diagnosis not present

## 2019-01-21 DIAGNOSIS — R0602 Shortness of breath: Secondary | ICD-10-CM | POA: Diagnosis not present

## 2019-01-24 DIAGNOSIS — M545 Low back pain: Secondary | ICD-10-CM | POA: Diagnosis not present

## 2019-01-27 DIAGNOSIS — M545 Low back pain: Secondary | ICD-10-CM | POA: Diagnosis not present

## 2019-01-30 DIAGNOSIS — M545 Low back pain: Secondary | ICD-10-CM | POA: Diagnosis not present

## 2019-02-04 DIAGNOSIS — M545 Low back pain: Secondary | ICD-10-CM | POA: Diagnosis not present

## 2019-02-06 DIAGNOSIS — M545 Low back pain: Secondary | ICD-10-CM | POA: Diagnosis not present

## 2019-02-10 DIAGNOSIS — M545 Low back pain: Secondary | ICD-10-CM | POA: Diagnosis not present

## 2019-02-12 DIAGNOSIS — E782 Mixed hyperlipidemia: Secondary | ICD-10-CM | POA: Diagnosis not present

## 2019-02-12 DIAGNOSIS — I10 Essential (primary) hypertension: Secondary | ICD-10-CM | POA: Diagnosis not present

## 2019-02-12 DIAGNOSIS — D72829 Elevated white blood cell count, unspecified: Secondary | ICD-10-CM | POA: Diagnosis not present

## 2019-02-12 DIAGNOSIS — Z0000 Encounter for general adult medical examination without abnormal findings: Secondary | ICD-10-CM | POA: Diagnosis not present

## 2019-02-12 DIAGNOSIS — R7309 Other abnormal glucose: Secondary | ICD-10-CM | POA: Diagnosis not present

## 2019-02-13 DIAGNOSIS — M545 Low back pain: Secondary | ICD-10-CM | POA: Diagnosis not present

## 2019-02-18 DIAGNOSIS — M545 Low back pain: Secondary | ICD-10-CM | POA: Diagnosis not present

## 2019-02-20 DIAGNOSIS — M545 Low back pain: Secondary | ICD-10-CM | POA: Diagnosis not present

## 2019-02-25 DIAGNOSIS — Z4889 Encounter for other specified surgical aftercare: Secondary | ICD-10-CM | POA: Diagnosis not present

## 2019-02-25 DIAGNOSIS — M25562 Pain in left knee: Secondary | ICD-10-CM | POA: Diagnosis not present

## 2019-02-25 DIAGNOSIS — M545 Low back pain: Secondary | ICD-10-CM | POA: Diagnosis not present

## 2019-02-25 DIAGNOSIS — M25561 Pain in right knee: Secondary | ICD-10-CM | POA: Diagnosis not present

## 2019-02-28 DIAGNOSIS — M545 Low back pain: Secondary | ICD-10-CM | POA: Diagnosis not present

## 2019-03-11 DIAGNOSIS — M545 Low back pain: Secondary | ICD-10-CM | POA: Diagnosis not present

## 2019-03-18 DIAGNOSIS — M545 Low back pain: Secondary | ICD-10-CM | POA: Diagnosis not present

## 2019-03-20 DIAGNOSIS — M545 Low back pain: Secondary | ICD-10-CM | POA: Diagnosis not present

## 2019-03-21 DIAGNOSIS — M25562 Pain in left knee: Secondary | ICD-10-CM | POA: Diagnosis not present

## 2019-03-21 DIAGNOSIS — Z4889 Encounter for other specified surgical aftercare: Secondary | ICD-10-CM | POA: Diagnosis not present

## 2019-03-21 DIAGNOSIS — M25561 Pain in right knee: Secondary | ICD-10-CM | POA: Insufficient documentation

## 2019-03-25 DIAGNOSIS — M545 Low back pain: Secondary | ICD-10-CM | POA: Diagnosis not present

## 2019-03-27 DIAGNOSIS — M545 Low back pain: Secondary | ICD-10-CM | POA: Diagnosis not present

## 2019-04-01 DIAGNOSIS — M545 Low back pain: Secondary | ICD-10-CM | POA: Diagnosis not present

## 2019-04-03 DIAGNOSIS — M545 Low back pain: Secondary | ICD-10-CM | POA: Diagnosis not present

## 2019-04-16 ENCOUNTER — Other Ambulatory Visit: Payer: Self-pay | Admitting: Cardiology

## 2019-04-16 DIAGNOSIS — R350 Frequency of micturition: Secondary | ICD-10-CM | POA: Diagnosis not present

## 2019-04-16 DIAGNOSIS — R300 Dysuria: Secondary | ICD-10-CM | POA: Diagnosis not present

## 2019-04-16 DIAGNOSIS — N3941 Urge incontinence: Secondary | ICD-10-CM | POA: Diagnosis not present

## 2019-04-16 MED ORDER — DILTIAZEM HCL ER COATED BEADS 120 MG PO CP24: 120.0000 mg | ORAL_CAPSULE | Freq: Every day | ORAL | 0 refills | Status: DC

## 2019-04-22 DIAGNOSIS — M545 Low back pain: Secondary | ICD-10-CM | POA: Diagnosis not present

## 2019-04-22 DIAGNOSIS — I10 Essential (primary) hypertension: Secondary | ICD-10-CM | POA: Diagnosis not present

## 2019-04-22 DIAGNOSIS — R072 Precordial pain: Secondary | ICD-10-CM | POA: Diagnosis not present

## 2019-04-22 DIAGNOSIS — R0602 Shortness of breath: Secondary | ICD-10-CM | POA: Diagnosis not present

## 2019-04-28 DIAGNOSIS — M545 Low back pain: Secondary | ICD-10-CM | POA: Diagnosis not present

## 2019-04-30 ENCOUNTER — Telehealth: Payer: Self-pay | Admitting: *Deleted

## 2019-04-30 DIAGNOSIS — M25561 Pain in right knee: Secondary | ICD-10-CM | POA: Diagnosis not present

## 2019-04-30 DIAGNOSIS — M1712 Unilateral primary osteoarthritis, left knee: Secondary | ICD-10-CM | POA: Diagnosis not present

## 2019-04-30 DIAGNOSIS — M1711 Unilateral primary osteoarthritis, right knee: Secondary | ICD-10-CM | POA: Diagnosis not present

## 2019-04-30 NOTE — Telephone Encounter (Signed)
Calling patient today to discuss upcoming appointment.  We are currently trying to limit exposure to the virus that causes COVID-19 by seeing patients at home rather than in the office. We would like to schedule this appointment as a Virtual Appointment VIA Smartphone or Laptop. Unable to reach patient.  LVMTCB  

## 2019-04-30 NOTE — Telephone Encounter (Signed)

## 2019-05-01 DIAGNOSIS — M545 Low back pain: Secondary | ICD-10-CM | POA: Diagnosis not present

## 2019-05-05 DIAGNOSIS — M545 Low back pain: Secondary | ICD-10-CM | POA: Diagnosis not present

## 2019-05-06 ENCOUNTER — Other Ambulatory Visit: Payer: Self-pay

## 2019-05-06 ENCOUNTER — Ambulatory Visit: Payer: Medicare Other | Admitting: Cardiology

## 2019-05-06 ENCOUNTER — Telehealth (INDEPENDENT_AMBULATORY_CARE_PROVIDER_SITE_OTHER): Payer: Medicare Other | Admitting: Cardiology

## 2019-05-06 DIAGNOSIS — I471 Supraventricular tachycardia: Secondary | ICD-10-CM

## 2019-05-06 MED ORDER — HYDROCHLOROTHIAZIDE 25 MG PO TABS: 25.0000 mg | ORAL_TABLET | Freq: Every day | ORAL | 3 refills | Status: DC

## 2019-05-06 NOTE — Progress Notes (Signed)
Electrophysiology TeleHealth Note   Due to national recommendations of social distancing due to COVID 19, an audio/video telehealth visit is felt to be most appropriate for this patient at this time.  See Epic message for the patient's consent to telehealth for [facility].   Date:  05/06/2019   ID:  [name], DOB 06/03/1956, MRN [id number]  Location: patient's home  Provider location: [address] [place]  Evaluation Performed: Follow-up visit  PCP:  Maurice Small, MD  Cardiologist:  Doylene Canard Electrophysiologist:  Dr Curt Bears  Chief Complaint:  SVT.  History of Present Illness:    [name] is a 63 y.o. female who presents via audio/video conferencing for a telehealth visit today.  Since last being seen in our clinic, the patient reports doing very well.  Today, she denies symptoms of palpitations, chest pain, shortness of breath,  lower extremity edema, dizziness, presyncope, or syncope.  The patient is otherwise without complaint today.  The patient denies symptoms of fevers, chills, cough, or new SOB worrisome for COVID 19.  She has a history of coronary artery disease, hypertension, hyperlipidemia.  She also has a history of SVT.  She was started on metoprolol.  SVT did break with adenosine.  She had short-lived atrial fibrillation post adenosine.  Today, denies symptoms of palpitations, chest pain, shortness of breath, orthopnea, PND, lower extremity edema, claudication, dizziness, presyncope, syncope, bleeding, or neurologic sequela. The patient is tolerating medications without difficulties. Feels well without major issues. Able to do all of her daily activities.    Past Medical History:  Diagnosis Date  . Agoraphobia without history of panic disorder   . Anginal pain ([place])   . Anxiety   . Arthritis    "qwhere" (04/03/2018)  . Childhood asthma   . Chronic bronchitis ([place])   . Chronic lower back pain   . COPD (chronic obstructive pulmonary  disease) ([place])   . Coronary artery disease CARDIOLOGIST- DR  Doylene Canard  (VISIT 03-30-11 W/ CHART)   NON-OBS. CAD   (STRESS TEST NOV. 2011  . Daily headache   . DDD (degenerative disc disease)   . Depression   . DJD (degenerative joint disease)    JOINT PAIN  . Dyspnea    "anytime but mostly on exertion and smoking"  . Fibromyalgia   . Frequency of urination   . GERD (gastroesophageal reflux disease) AND HIATIAL HERNIA   CONTROLLED W/ NEXIUM  . Heart murmur   . Hemorrhoids   . High cholesterol   . History of blood transfusion    "related to anemia" (04/03/2018)  . History of gastric ulcer 2004  . History of hiatal hernia   . Hypertension   . IBS (irritable bowel syndrome)   . Incisional pain s/p interstim implant 1st stage--- 12-07-11    left upper buttock-- pt states dressing clean dry and intact (on 12-08-11)  . Insomnia   . Iron deficiency anemia   . Nocturia   . Non-productive cough   . Numbness in both hands AT TIMES  . OSA (obstructive sleep apnea)    "suppose to wear mask; I don't" (04/03/2018)  . Urge urinary incontinence     Past Surgical History:  Procedure Laterality Date  . ANTERIOR CERVICAL DECOMP/DISCECTOMY FUSION  2008   C4 - T1 ("screws from 1st OR came out")  . ANTERIOR CERVICAL DECOMP/DISCECTOMY FUSION  2002   C4 - 7  . ANTERIOR LATERAL LUMBAR FUSION WITH PERCUTANEOUS SCREW 2 LEVEL Left  11/27/2018   Procedure: XLIF Lumbar 3-5, posterior spinal fusion L3-5;  Surgeon: Melina Schools, MD;  Location: [place];  Service: Orthopedics;  Laterality: Left;  5.5 hrs for entire procedure  . APPENDECTOMY  1982  . BACK SURGERY    . CARDIAC CATHETERIZATION  2003  . CARDIAC CATHETERIZATION N/A 08/23/2016   Procedure: Left Heart Cath and Coronary Angiography;  Surgeon: Dixie Dials, MD;  Location: [place] [facility];  Service: Cardiovascular;  Laterality: N/A;  . CARPAL TUNNEL RELEASE Right   . CATARACT EXTRACTION W/ INTRAOCULAR LENS  IMPLANT, BILATERAL  2016-2018  .  CHOLECYSTECTOMY OPEN  1982  . CYSTO/ HOD/ BLADDER BX  2006  . CYSTO/ HOD/ BLADDER BX/ FULGERATION  06-12-2011  . FINGER SURGERY Right 2001   "replaced worn cartilage on thumb w/tendons"  . HYSTEROSCOPY W/D&C  2005  . INTERSTIM IMPLANT PLACEMENT  12/07/2011   Procedure: Barrie Lyme IMPLANT FIRST STAGE;  Surgeon: [name], MD;  Location: [facility];  Service: Urology;  Laterality: Right;  . INTERSTIM IMPLANT PLACEMENT  12/14/2011   Procedure: Barrie Lyme IMPLANT SECOND STAGE;  Surgeon: [name], MD;  Location: [facility];  Service: Urology;  Laterality: Right;  rad tech ok by vickie at [facility]  . LUMBAR LAMINECTOMY  2007   L3 - 5  . TONSILLECTOMY AND ADENOIDECTOMY  1965  . TUBAL LIGATION    . WRIST SURGERY Left    TFCC ("tendon repair")    Current Outpatient Medications  Medication Sig Dispense Refill  . albuterol (PROVENTIL HFA;VENTOLIN HFA) 108 (90 BASE) MCG/ACT inhaler Inhale 2 puffs into the lungs every 6 (six) hours as needed for wheezing or shortness of breath.    Marland Kitchen atorvastatin (LIPITOR) 20 MG tablet Take 1 tablet (20 mg total) by mouth daily. 30 tablet 4  . buPROPion (WELLBUTRIN XL) 300 MG 24 hr tablet Take 300 mg by mouth daily.    . cholecalciferol (VITAMIN D) 1000 UNITS tablet Take 1,000 Units by mouth daily.     . clonazePAM (KLONOPIN) 1 MG tablet Take 1 tablet (1 mg total) by mouth at bedtime. 30 tablet 5  . diltiazem (CARDIZEM CD) 120 MG 24 hr capsule Take 1 capsule (120 mg total) by mouth daily. Please keep upcoming appt with Dr. Curt Bears. Thank you 90 capsule 0  . DULoxetine (CYMBALTA) 60 MG capsule Take 60 mg by mouth at bedtime.     . fenofibrate (TRICOR) 48 MG tablet Take 48 mg by mouth daily.   6  . ferrous sulfate 325 (65 FE) MG tablet Take 325 mg by mouth daily with breakfast.    . gabapentin (NEURONTIN) 100 MG capsule Take 1 capsule (100 mg total) by mouth at bedtime. 30 capsule 0  . hydrALAZINE (APRESOLINE) 25 MG tablet  Take 25 mg by mouth 3 (three) times daily.     . Magnesium 500 MG CAPS Take 500 mg by mouth daily.     . methocarbamol (ROBAXIN) 500 MG tablet Take 1 tablet (500 mg total) by mouth every 8 (eight) hours as needed for muscle spasms. 15 tablet 0  . montelukast (SINGULAIR) 10 MG tablet Take 10 mg by mouth at bedtime.    . nitroGLYCERIN (NITROSTAT) 0.4 MG SL tablet Place 1 tablet (0.4 mg total) under the tongue every 5 (five) minutes x 3 doses as needed for chest pain. 10 tablet 0  . ondansetron (ZOFRAN) 4 MG tablet Take 1 tablet (4 mg total) by mouth every 8 (eight) hours as  needed for nausea or vomiting. 20 tablet 0  . pantoprazole (PROTONIX) 40 MG tablet Take 1 tablet (40 mg total) by mouth daily as needed. (Patient taking differently: Take 40 mg by mouth daily. ) 20 tablet 0  . SUPER B COMPLEX/C PO Take 1 tablet by mouth daily.      No current facility-administered medications for this visit.     Allergies:   Midazolam hcl; Prednisone; Statins; Ace inhibitors; Amoxicillin; Nsaids; and Sulfa antibiotics   Social History:  The patient  reports that she has been smoking. She has a 86.00 pack-year smoking history. She has never used smokeless tobacco. She reports previous alcohol use. She reports that she does not use drugs.   Family History:  The patient's  family history includes Alzheimer's disease in her mother; Breast cancer in her mother; Coronary artery disease in her mother; Heart disease in her father; Hypertension in her brother, daughter, father, mother, and sister; Kidney failure in her father.   ROS:  Please see the history of present illness.   All other systems are personally reviewed and negative.    Exam:    Vital Signs:  BP (!) 148/83   Pulse 62   Wt 163 lb 9.6 oz (74.2 kg)   LMP 09/06/2005   BMI 33.04 kg/m   Well appearing, alert and conversant, regular work of breathing,  good skin color Eyes- anicteric, neuro- grossly intact, skin- no apparent rash or lesions or  cyanosis, mouth- oral mucosa is pink   Labs/Other Tests and Data Reviewed:    Recent Labs: 11/19/2018: BUN 16; Creatinine, Ser 1.08; Hemoglobin 15.1; Platelets 280; Potassium 3.8; Sodium 140   Wt Readings from Last 3 Encounters:  05/06/19 163 lb 9.6 oz (74.2 kg)  11/27/18 162 lb (73.5 kg)  11/19/18 162 lb (73.5 kg)     Other studies personally reviewed: Additional studies/ records that were reviewed today include: [place] 04/30/18 personally reviewed  Review of the above records today demonstrates: Sinus rhythm   ASSESSMENT & PLAN:    1.  Coronary artery disease: Found on left heart catheterization.  Lifestyle modifications recommended.  Currently on optimal therapy.  No current chest pain.  2.  Hypertension: Currently on hydralazine 3 times a day.  Her blood pressure remains elevated.  We Toryn Dewalt thus add HCTZ 25 mg daily.  She wants a 30-day prescription with refills.  She does have follow-up with [name] in 2 months for a repeat blood pressure check.  He can adjust medications from there.  3.  Hyperlipidemia: Intolerant to statins.  4.  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  Currently on diltiazem.  No further episodes of SVT.  5.  Atrial fibrillation: Found after adenosine for SVT.  48-hour monitor without atrial fibrillation.  Currently not anticoagulated.  6.  Obstructive sleep apnea: Encouraged CPAP compliance.  7.  Tobacco abuse: Cessation encouraged   COVID 19 screen The patient denies symptoms of COVID 19 at this time.  The importance of social distancing was discussed today.  Follow-up: 1 year  Current medicines are reviewed at length with the patient today.   The patient does not have concerns regarding her medicines.  The following changes were made today: Start HCTZ 25 mg  Labs/ tests ordered today include:  No orders of the defined types were placed in this encounter.    Patient Risk:  after full review of this patients clinical  status, I feel that they are at moderate risk at this time.  Today, I have spent 11 minutes with the patient with telehealth technology discussing SVT, hypertension.    Signed, Anakaren Campion Meredith Leeds, MD  05/06/2019 10:53 AM     [facility] [address] [place] [address] [phone] (office) [phone] (fax)

## 2019-05-06 NOTE — Patient Instructions (Signed)
Medication Instructions:  ADD HCTZ 25mg  once a day... 30 day RX sent to your pharmacy  If you need a refill on your cardiac medications before your next appointment, please call your pharmacy.   Lab work: None ordered   Testing/Procedures: None ordered  Follow-Up:Your physician wants you to follow-up in: 1 year. You will receive a reminder letter in the mail two months in advance. If you don't receive a letter, please call our office to schedule the follow-up appointment.  At [facility], you and your health needs are our priority.  As part of our continuing mission to provide you with exceptional heart care, we have created designated Provider Care Teams.  These Care Teams include your primary Cardiologist (physician) and Advanced Practice Providers (APPs -  Physician Assistants and Nurse Practitioners) who all work together to provide you with the care you need, when you need it. You will need a follow up appointment in 12 months.  Please call our office 2 months in advance to schedule this appointment.  You may see one of the following Advanced Practice Providers on your designated Care Team:   Chanetta Marshall, NP . Tommye Standard, PA-C  Any Other Special Instructions Will Be Listed Below (If Applicable). Keep visit with Dr.Kadakia for further BP control and management.

## 2019-05-06 NOTE — Addendum Note (Signed)
Addended by: Stephani Police on: 05/06/2019 11:00 AM   Modules accepted: Orders

## 2019-05-07 ENCOUNTER — Other Ambulatory Visit: Payer: Self-pay | Admitting: Cardiology

## 2019-05-13 DIAGNOSIS — M545 Low back pain: Secondary | ICD-10-CM | POA: Diagnosis not present

## 2019-05-16 DIAGNOSIS — M545 Low back pain: Secondary | ICD-10-CM | POA: Diagnosis not present

## 2019-05-19 DIAGNOSIS — Z981 Arthrodesis status: Secondary | ICD-10-CM | POA: Diagnosis not present

## 2019-05-24 DIAGNOSIS — Z20828 Contact with and (suspected) exposure to other viral communicable diseases: Secondary | ICD-10-CM | POA: Diagnosis not present

## 2019-05-27 DIAGNOSIS — R05 Cough: Secondary | ICD-10-CM | POA: Diagnosis not present

## 2019-06-03 DIAGNOSIS — G4733 Obstructive sleep apnea (adult) (pediatric): Secondary | ICD-10-CM | POA: Diagnosis not present

## 2019-06-16 DIAGNOSIS — Z1231 Encounter for screening mammogram for malignant neoplasm of breast: Secondary | ICD-10-CM | POA: Diagnosis not present

## 2019-06-23 DIAGNOSIS — M545 Low back pain: Secondary | ICD-10-CM | POA: Diagnosis not present

## 2019-06-23 DIAGNOSIS — R072 Precordial pain: Secondary | ICD-10-CM | POA: Diagnosis not present

## 2019-06-23 DIAGNOSIS — J4520 Mild intermittent asthma, uncomplicated: Secondary | ICD-10-CM | POA: Diagnosis not present

## 2019-06-23 DIAGNOSIS — I10 Essential (primary) hypertension: Secondary | ICD-10-CM | POA: Diagnosis not present

## 2019-07-02 DIAGNOSIS — M25562 Pain in left knee: Secondary | ICD-10-CM | POA: Diagnosis not present

## 2019-07-02 DIAGNOSIS — M25561 Pain in right knee: Secondary | ICD-10-CM | POA: Diagnosis not present

## 2019-07-03 ENCOUNTER — Other Ambulatory Visit: Payer: Self-pay

## 2019-07-03 ENCOUNTER — Observation Stay (HOSPITAL_COMMUNITY)
Admission: AD | Admit: 2019-07-03 | Discharge: 2019-07-04 | Disposition: A | Discharge status: Home / Self Care | Payer: Medicare Other | Source: Ambulatory Visit | Attending: Cardiovascular Disease | Admitting: Cardiovascular Disease

## 2019-07-03 DIAGNOSIS — F17200 Nicotine dependence, unspecified, uncomplicated: Secondary | ICD-10-CM | POA: Diagnosis not present

## 2019-07-03 DIAGNOSIS — I2510 Atherosclerotic heart disease of native coronary artery without angina pectoris: Secondary | ICD-10-CM | POA: Diagnosis present

## 2019-07-03 DIAGNOSIS — M545 Low back pain: Secondary | ICD-10-CM | POA: Diagnosis not present

## 2019-07-03 DIAGNOSIS — R739 Hyperglycemia, unspecified: Secondary | ICD-10-CM | POA: Diagnosis not present

## 2019-07-03 DIAGNOSIS — Z9049 Acquired absence of other specified parts of digestive tract: Secondary | ICD-10-CM | POA: Diagnosis not present

## 2019-07-03 DIAGNOSIS — E669 Obesity, unspecified: Secondary | ICD-10-CM | POA: Insufficient documentation

## 2019-07-03 DIAGNOSIS — Z969 Presence of functional implant, unspecified: Secondary | ICD-10-CM | POA: Diagnosis not present

## 2019-07-03 DIAGNOSIS — R079 Chest pain, unspecified: Secondary | ICD-10-CM | POA: Diagnosis not present

## 2019-07-03 DIAGNOSIS — D72829 Elevated white blood cell count, unspecified: Secondary | ICD-10-CM | POA: Diagnosis not present

## 2019-07-03 DIAGNOSIS — F419 Anxiety disorder, unspecified: Secondary | ICD-10-CM | POA: Insufficient documentation

## 2019-07-03 DIAGNOSIS — Z23 Encounter for immunization: Secondary | ICD-10-CM | POA: Insufficient documentation

## 2019-07-03 DIAGNOSIS — K219 Gastro-esophageal reflux disease without esophagitis: Secondary | ICD-10-CM | POA: Insufficient documentation

## 2019-07-03 DIAGNOSIS — J449 Chronic obstructive pulmonary disease, unspecified: Secondary | ICD-10-CM | POA: Diagnosis not present

## 2019-07-03 DIAGNOSIS — J45909 Unspecified asthma, uncomplicated: Secondary | ICD-10-CM | POA: Diagnosis not present

## 2019-07-03 DIAGNOSIS — I10 Essential (primary) hypertension: Secondary | ICD-10-CM | POA: Diagnosis present

## 2019-07-03 DIAGNOSIS — Z79899 Other long term (current) drug therapy: Secondary | ICD-10-CM | POA: Diagnosis not present

## 2019-07-03 DIAGNOSIS — I249 Acute ischemic heart disease, unspecified: Secondary | ICD-10-CM | POA: Diagnosis present

## 2019-07-03 DIAGNOSIS — Z20828 Contact with and (suspected) exposure to other viral communicable diseases: Secondary | ICD-10-CM | POA: Diagnosis not present

## 2019-07-03 DIAGNOSIS — G4733 Obstructive sleep apnea (adult) (pediatric): Secondary | ICD-10-CM | POA: Diagnosis not present

## 2019-07-03 DIAGNOSIS — R0602 Shortness of breath: Secondary | ICD-10-CM | POA: Diagnosis not present

## 2019-07-03 DIAGNOSIS — R072 Precordial pain: Secondary | ICD-10-CM | POA: Diagnosis not present

## 2019-07-03 DIAGNOSIS — Z720 Tobacco use: Secondary | ICD-10-CM | POA: Diagnosis present

## 2019-07-03 LAB — CBC WITH DIFFERENTIAL/PLATELET
Abs Immature Granulocytes: 0 10*3/uL (ref 0.00–0.07)
Basophils Absolute: 0 10*3/uL (ref 0.0–0.1)
Basophils Relative: 0 %
Eosinophils Absolute: 0.4 10*3/uL (ref 0.0–0.5)
Eosinophils Relative: 1 %
HCT: 43.9 % (ref 36.0–46.0)
Hemoglobin: 14.4 g/dL (ref 12.0–15.0)
Lymphocytes Relative: 7 %
Lymphs Abs: 2.7 10*3/uL (ref 0.7–4.0)
MCH: 29.6 pg (ref 26.0–34.0)
MCHC: 32.8 g/dL (ref 30.0–36.0)
MCV: 90.3 fL (ref 80.0–100.0)
Monocytes Absolute: 1.6 10*3/uL — ABNORMAL HIGH (ref 0.1–1.0)
Monocytes Relative: 4 %
Neutro Abs: 34.2 10*3/uL — ABNORMAL HIGH (ref 1.7–7.7)
Neutrophils Relative %: 88 %
Platelets: 403 10*3/uL — ABNORMAL HIGH (ref 150–400)
RBC: 4.86 MIL/uL (ref 3.87–5.11)
RDW: 13.8 % (ref 11.5–15.5)
WBC: 38.9 10*3/uL — ABNORMAL HIGH (ref 4.0–10.5)
nRBC: 0 % (ref 0.0–0.2)
nRBC: 0 /100 WBC

## 2019-07-03 LAB — COMPREHENSIVE METABOLIC PANEL
ALT: 38 U/L (ref 0–44)
AST: 40 U/L (ref 15–41)
Albumin: 4.7 g/dL (ref 3.5–5.0)
Alkaline Phosphatase: 68 U/L (ref 38–126)
Anion gap: 17 — ABNORMAL HIGH (ref 5–15)
BUN: 25 mg/dL — ABNORMAL HIGH (ref 8–23)
CO2: 24 mmol/L (ref 22–32)
Calcium: 11.5 mg/dL — ABNORMAL HIGH (ref 8.9–10.3)
Chloride: 96 mmol/L — ABNORMAL LOW (ref 98–111)
Creatinine, Ser: 1.29 mg/dL — ABNORMAL HIGH (ref 0.44–1.00)
GFR calc Af Amer: 50 mL/min — ABNORMAL LOW (ref >60)
GFR calc non Af Amer: 43 mL/min — ABNORMAL LOW (ref >60)
Glucose, Bld: 190 mg/dL — ABNORMAL HIGH (ref 70–99)
Potassium: 3.9 mmol/L (ref 3.5–5.1)
Sodium: 137 mmol/L (ref 135–145)
Total Bilirubin: 0.3 mg/dL (ref 0.3–1.2)
Total Protein: 7.5 g/dL (ref 6.5–8.1)

## 2019-07-03 LAB — TROPONIN I (HIGH SENSITIVITY)
Troponin I (High Sensitivity): 31 ng/L — ABNORMAL HIGH (ref <18)
Troponin I (High Sensitivity): 41 ng/L — ABNORMAL HIGH (ref <18)

## 2019-07-03 LAB — SARS CORONAVIRUS 2 BY RT PCR (HOSPITAL ORDER, PERFORMED IN ~~LOC~~ HOSPITAL LAB): SARS Coronavirus 2: NEGATIVE

## 2019-07-03 MED ORDER — SODIUM CHLORIDE 0.9% FLUSH: 10.0000 mL | Freq: Two times a day (BID) | INTRAVENOUS | Status: DC

## 2019-07-03 MED ORDER — NITROGLYCERIN 0.4 MG SL SUBL: 0.4000 mg | SUBLINGUAL_TABLET | SUBLINGUAL | Status: DC | PRN

## 2019-07-03 MED ORDER — METOPROLOL TARTRATE 12.5 MG HALF TABLET
12.5000 mg | ORAL_TABLET | Freq: Two times a day (BID) | ORAL | Status: DC
  Administered 2019-07-03 – 2019-07-04 (×2): 12.5 mg via ORAL
  Filled 2019-07-03 (×2): qty 1

## 2019-07-03 MED ORDER — HEPARIN (PORCINE) 25000 UT/250ML-% IV SOLN
1100.0000 [IU]/h | INTRAVENOUS | Status: DC
  Administered 2019-07-03 – 2019-07-04 (×2): 1100 [IU]/h via INTRAVENOUS
  Filled 2019-07-03 (×2): qty 250

## 2019-07-03 MED ORDER — ASPIRIN EC 81 MG PO TBEC
81.0000 mg | DELAYED_RELEASE_TABLET | Freq: Every day | ORAL | Status: DC
  Administered 2019-07-04: 81 mg via ORAL
  Filled 2019-07-03: qty 1

## 2019-07-03 MED ORDER — SODIUM CHLORIDE 0.9% FLUSH: 10.0000 mL | INTRAVENOUS | Status: DC | PRN

## 2019-07-03 MED ORDER — PNEUMOCOCCAL VAC POLYVALENT 25 MCG/0.5ML IJ INJ
0.5000 mL | INJECTION | INTRAMUSCULAR | Status: AC
  Administered 2019-07-04: 0.5 mL via INTRAMUSCULAR
  Filled 2019-07-03: qty 0.5

## 2019-07-03 MED ORDER — ONDANSETRON HCL 4 MG/2ML IJ SOLN: 4.0000 mg | Freq: Four times a day (QID) | INTRAMUSCULAR | Status: DC | PRN

## 2019-07-03 MED ORDER — ACETAMINOPHEN 325 MG PO TABS
650.0000 mg | ORAL_TABLET | ORAL | Status: DC | PRN
  Administered 2019-07-03 – 2019-07-04 (×2): 650 mg via ORAL
  Filled 2019-07-03 (×2): qty 2

## 2019-07-03 MED ORDER — HEPARIN BOLUS VIA INFUSION
4000.0000 [IU] | Freq: Once | INTRAVENOUS | Status: AC
  Administered 2019-07-03: 4000 [IU] via INTRAVENOUS
  Filled 2019-07-03: qty 4000

## 2019-07-03 NOTE — H&P (Signed)
Referring Physician:  [name] is an 63 y.o. female.                       Chief Complaint: Chest pain  HPI: 63 years old white female with recent cortisone shot for left knee pain has palpitation and chest pain. She has PMH of CAD, COPD, hypertension, lumbar disc disease, chrionic anxiety and GERD. Her EKG in office showed 1 mm ST elevation in septal leads.   Past Medical History:  Diagnosis Date  . Agoraphobia without history of panic disorder   . Anginal pain ([place])   . Anxiety   . Arthritis    "qwhere" (04/03/2018)  . Childhood asthma   . Chronic bronchitis ([place])   . Chronic lower back pain   . COPD (chronic obstructive pulmonary disease) ([place])   . Coronary artery disease CARDIOLOGIST- DR  Doylene Canard  (VISIT 03-30-11 W/ CHART)   NON-OBS. CAD   (STRESS TEST NOV. 2011  . Daily headache   . DDD (degenerative disc disease)   . Depression   . DJD (degenerative joint disease)    JOINT PAIN  . Dyspnea    "anytime but mostly on exertion and smoking"  . Fibromyalgia   . Frequency of urination   . GERD (gastroesophageal reflux disease) AND HIATIAL HERNIA   CONTROLLED W/ NEXIUM  . Heart murmur   . Hemorrhoids   . High cholesterol   . History of blood transfusion    "related to anemia" (04/03/2018)  . History of gastric ulcer 2004  . History of hiatal hernia   . Hypertension   . IBS (irritable bowel syndrome)   . Incisional pain s/p interstim implant 1st stage--- 12-07-11    left upper buttock-- pt states dressing clean dry and intact (on 12-08-11)  . Insomnia   . Iron deficiency anemia   . Nocturia   . Non-productive cough   . Numbness in both hands AT TIMES  . OSA (obstructive sleep apnea)    "suppose to wear mask; I don't" (04/03/2018)  . Urge urinary incontinence       Past Surgical History:  Procedure Laterality Date  . ANTERIOR CERVICAL DECOMP/DISCECTOMY FUSION  2008   C4 - T1 ("screws from 1st OR came out")  . ANTERIOR CERVICAL DECOMP/DISCECTOMY FUSION  2002    C4 - 7  . ANTERIOR LATERAL LUMBAR FUSION WITH PERCUTANEOUS SCREW 2 LEVEL Left 11/27/2018   Procedure: XLIF Lumbar 3-5, posterior spinal fusion L3-5;  Surgeon: Melina Schools, MD;  Location: [place];  Service: Orthopedics;  Laterality: Left;  5.5 hrs for entire procedure  . APPENDECTOMY  1982  . BACK SURGERY    . CARDIAC CATHETERIZATION  2003  . CARDIAC CATHETERIZATION N/A 08/23/2016   Procedure: Left Heart Cath and Coronary Angiography;  Surgeon: Dixie Dials, MD;  Location: [place] [facility];  Service: Cardiovascular;  Laterality: N/A;  . CARPAL TUNNEL RELEASE Right   . CATARACT EXTRACTION W/ INTRAOCULAR LENS  IMPLANT, BILATERAL  2016-2018  . CHOLECYSTECTOMY OPEN  1982  . CYSTO/ HOD/ BLADDER BX  2006  . CYSTO/ HOD/ BLADDER BX/ FULGERATION  06-12-2011  . FINGER SURGERY Right 2001   "replaced worn cartilage on thumb w/tendons"  . HYSTEROSCOPY W/D&C  2005  . INTERSTIM IMPLANT PLACEMENT  12/07/2011   Procedure: Barrie Lyme IMPLANT FIRST STAGE;  Surgeon: [name], MD;  Location: [facility];  Service: Urology;  Laterality: Right;  . INTERSTIM IMPLANT PLACEMENT  12/14/2011  Procedure: INTERSTIM IMPLANT SECOND STAGE;  Surgeon: [name], MD;  Location: [facility];  Service: Urology;  Laterality: Right;  rad tech ok by vickie at [facility]  . LUMBAR LAMINECTOMY  2007   L3 - 5  . TONSILLECTOMY AND ADENOIDECTOMY  1965  . TUBAL LIGATION    . WRIST SURGERY Left    TFCC ("tendon repair")    Family History  Problem Relation Age of Onset  . Hypertension Father   . Heart disease Father   . Kidney failure Father   . Breast cancer Mother   . Hypertension Mother   . Coronary artery disease Mother   . Alzheimer's disease Mother   . Hypertension Brother   . Hypertension Sister   . Hypertension Daughter    Social History:  reports that she has been smoking. She has a 86.00 pack-year smoking history. She has never used smokeless tobacco. She reports previous  alcohol use. She reports that she does not use drugs.  Allergies:  Allergies  Allergen Reactions  . Midazolam Hcl Other (See Comments)    HYPER  . Prednisone Other (See Comments)    HYPER, depressed, moody  . Statins Other (See Comments)    Causes muscle weakness and joint pain  . Ace Inhibitors Other (See Comments)    DECREASES HEARTRATE  . Amoxicillin Diarrhea  . Nsaids Other (See Comments)    AVOIDS; HX GASTRIC ULCER  . Sulfa Antibiotics Rash and Other (See Comments)    JOINT PAIN    Medications Prior to Admission  Medication Sig Dispense Refill  . albuterol (PROVENTIL HFA;VENTOLIN HFA) 108 (90 BASE) MCG/ACT inhaler Inhale 2 puffs into the lungs every 6 (six) hours as needed for wheezing or shortness of breath.    Marland Kitchen atorvastatin (LIPITOR) 20 MG tablet Take 1 tablet (20 mg total) by mouth daily. 30 tablet 4  . buPROPion (WELLBUTRIN XL) 300 MG 24 hr tablet Take 300 mg by mouth daily.    . cholecalciferol (VITAMIN D) 1000 UNITS tablet Take 1,000 Units by mouth daily.     . clonazePAM (KLONOPIN) 1 MG tablet Take 1 tablet (1 mg total) by mouth at bedtime. 30 tablet 5  . diltiazem (CARDIZEM CD) 120 MG 24 hr capsule Take 1 capsule (120 mg total) by mouth daily. Please keep upcoming appt with Dr. Curt Bears. Thank you 90 capsule 0  . DULoxetine (CYMBALTA) 60 MG capsule Take 60 mg by mouth at bedtime.     . fenofibrate (TRICOR) 48 MG tablet Take 48 mg by mouth daily.   6  . ferrous sulfate 325 (65 FE) MG tablet Take 325 mg by mouth daily with breakfast.    . gabapentin (NEURONTIN) 100 MG capsule Take 1 capsule (100 mg total) by mouth at bedtime. 30 capsule 0  . hydrALAZINE (APRESOLINE) 25 MG tablet Take 25 mg by mouth 3 (three) times daily.     . hydrochlorothiazide (HYDRODIURIL) 25 MG tablet TAKE 1 TABLET BY MOUTH EVERY DAY 90 tablet 1  . Magnesium 500 MG CAPS Take 500 mg by mouth daily.     . methocarbamol (ROBAXIN) 500 MG tablet Take 1 tablet (500 mg total) by mouth every 8 (eight) hours  as needed for muscle spasms. 15 tablet 0  . montelukast (SINGULAIR) 10 MG tablet Take 10 mg by mouth at bedtime.    . nitroGLYCERIN (NITROSTAT) 0.4 MG SL tablet Place 1 tablet (0.4 mg total) under the tongue every 5 (five) minutes x 3 doses as needed  for chest pain. 10 tablet 0  . ondansetron (ZOFRAN) 4 MG tablet Take 1 tablet (4 mg total) by mouth every 8 (eight) hours as needed for nausea or vomiting. 20 tablet 0  . pantoprazole (PROTONIX) 40 MG tablet Take 1 tablet (40 mg total) by mouth daily as needed. (Patient taking differently: Take 40 mg by mouth daily. ) 20 tablet 0  . SUPER B COMPLEX/C PO Take 1 tablet by mouth daily.       Results for orders placed or performed during the hospital encounter of 07/03/19 (from the past 48 hour(s))  Comprehensive metabolic panel     Status: Abnormal   Collection Time: 07/03/19  4:19 PM  Result Value Ref Range   Sodium 137 135 - 145 mmol/L   Potassium 3.9 3.5 - 5.1 mmol/L   Chloride 96 (L) 98 - 111 mmol/L   CO2 24 22 - 32 mmol/L   Glucose, Bld 190 (H) 70 - 99 mg/dL   BUN 25 (H) 8 - 23 mg/dL   Creatinine, Ser 1.29 (H) 0.44 - 1.00 mg/dL   Calcium 11.5 (H) 8.9 - 10.3 mg/dL   Total Protein 7.5 6.5 - 8.1 g/dL   Albumin 4.7 3.5 - 5.0 g/dL   AST 40 15 - 41 U/L   ALT 38 0 - 44 U/L   Alkaline Phosphatase 68 38 - 126 U/L   Total Bilirubin 0.3 0.3 - 1.2 mg/dL   GFR calc non Af Amer 43 (L) >60 mL/min   GFR calc Af Amer 50 (L) >60 mL/min   Anion gap 17 (H) 5 - 15    Comment: Performed at [facility], [address], [place], [place] [address]  Troponin I (High Sensitivity)     Status: Abnormal   Collection Time: 07/03/19  4:19 PM  Result Value Ref Range   Troponin I (High Sensitivity) 31 (H) <18 ng/L    Comment: (NOTE) Elevated high sensitivity troponin I (hsTnI) values and significant  changes across serial measurements may suggest ACS but many other  chronic and acute conditions are known to elevate hsTnI results.  Refer to the "Links"  section for chest pain algorithms and additional  guidance. Performed at [facility], [place] [address], [place], [place] [address]   CBC WITH DIFFERENTIAL     Status: Abnormal   Collection Time: 07/03/19  4:19 PM  Result Value Ref Range   WBC 38.9 (H) 4.0 - 10.5 K/uL    Comment: REPEATED TO VERIFY WHITE COUNT CONFIRMED ON SMEAR    RBC 4.86 3.87 - 5.11 MIL/uL   Hemoglobin 14.4 12.0 - 15.0 g/dL   HCT 43.9 36.0 - 46.0 %   MCV 90.3 80.0 - 100.0 fL   MCH 29.6 26.0 - 34.0 pg   MCHC 32.8 30.0 - 36.0 g/dL   RDW 13.8 11.5 - 15.5 %   Platelets 403 (H) 150 - 400 K/uL   nRBC 0.0 0.0 - 0.2 %   Neutrophils Relative % 88 %   Neutro Abs 34.2 (H) 1.7 - 7.7 K/uL   Lymphocytes Relative 7 %   Lymphs Abs 2.7 0.7 - 4.0 K/uL   Monocytes Relative 4 %   Monocytes Absolute 1.6 (H) 0.1 - 1.0 K/uL   Eosinophils Relative 1 %   Eosinophils Absolute 0.4 0.0 - 0.5 K/uL   Basophils Relative 0 %   Basophils Absolute 0.0 0.0 - 0.1 K/uL   nRBC 0 0 /100 WBC   Abs Immature Granulocytes 0.00  0.00 - 0.07 K/uL    Comment: Performed at [facility], [place] [address] [place], [place] [address]   No results found.  Review Of Systems Constitutional: No fever, chills, weight loss or gain. Eyes: No vision change, wears glasses. No discharge or pain. Ears: No hearing loss, No tinnitus. Respiratory: Positive asthma, COPD, pneumonias, shortness of breath. No hemoptysis. Cardiovascular: Positive chest pain, palpitation, leg edema. Gastrointestinal: No nausea, vomiting, diarrhea, constipation. No GI bleed. No hepatitis. Positive GERD. Genitourinary: No dysuria, hematuria, kidney stone. No incontinance. Neurological: Positive headache, no stroke, seizures.  Psychiatry: No psych facility admission for anxiety, depression, suicide. No detox. Skin: No rash. Musculoskeletal: Positive joint pain, fibromyalgia, neck pain, back pain. Lymphadenopathy: No lymphadenopathy. Hematology: No anemia or easy  bruising.   Blood pressure (!) 175/88, pulse (!) 109, temperature 98.1 F (36.7 C), temperature source Oral, resp. rate 16, height 4\' 11"  (1.499 m), weight 76.4 kg, last menstrual period 09/06/2005, SpO2 99 %. Body mass index is 34.01 kg/m. General appearance: alert, cooperative, appears stated age and no distress Head: Normocephalic, atraumatic. Eyes: Blue eyes, pink conjunctiva, corneas clear. PERRL, EOM's intact. Neck: No adenopathy, no carotid bruit, no JVD, supple, symmetrical, trachea midline and thyroid not enlarged. Resp: Clear to auscultation bilaterally. Cardio: Regular rate and rhythm, S1, S2 normal, II/VI systolic murmur, no click, rub or gallop GI: Soft, non-tender; bowel sounds normal; no organomegaly. Extremities: Trace edema, no cyanosis or clubbing. Skin: Warm and dry.  Neurologic: Alert and oriented X 3, normal strength. Normal coordination and gait.  Assessment/Plan Acute coronary syndrome CAD Hypertension Chronic back pain Chronic anxiety GERD COPD Tobacco use disorder Obesity  Place in observation R/O MI Cardiac cath v/s NM myocardial perfusion stress test.  Time spent: Review of old records, Lab, x-rays, EKG, other cardiac tests, examination, discussion with patient over 70 minutes.  Birdie Riddle, MD  07/03/2019, 6:38 PM

## 2019-07-03 NOTE — Progress Notes (Signed)
ANTICOAGULATION CONSULT NOTE - Follow Up Consult  Pharmacy Consult for Heparin Indication: chest pain/ACS  Allergies  Allergen Reactions  . Midazolam Hcl Other (See Comments)    HYPER  . Prednisone Other (See Comments)    HYPER, depressed, moody  . Statins Other (See Comments)    Causes muscle weakness and joint pain  . Ace Inhibitors Other (See Comments)    DECREASES HEARTRATE  . Amoxicillin Diarrhea  . Nsaids Other (See Comments)    AVOIDS; HX GASTRIC ULCER  . Sulfa Antibiotics Rash and Other (See Comments)    JOINT PAIN    Patient Measurements: Height: 4\' 11"  (149.9 cm) Weight: 168 lb 6.4 oz (76.4 kg) IBW/kg (Calculated) : 43.2  Vital Signs: Temp: 98.1 F (36.7 C) (07/16 1554) Temp Source: Oral (07/16 1554) BP: 175/88 (07/16 1554) Pulse Rate: 109 (07/16 1554)   Assessment: 63 year old female to begin heparin for chest pain  Goal of Therapy:  Heparin level 0.3-0.7 units/ml Monitor platelets by anticoagulation protocol: Yes   Plan:  Heparin 4000 units iv bolus x 1 Heparin at 1100 units / hr Heparin level 6 hours after heparin starts Daily heparin level, CBC  Thank you Anette Guarneri, PharmD [phone]  07/03/2019,4:09 PM

## 2019-07-03 NOTE — Plan of Care (Signed)
  Problem: Clinical Measurements: Goal: Cardiovascular complication will be avoided Outcome: Progressing   Problem: Activity: Goal: Risk for activity intolerance will decrease Outcome: Progressing   Problem: Elimination: Goal: Will not experience complications related to urinary retention Outcome: Progressing   Problem: Pain Managment: Goal: General experience of comfort will improve Outcome: Progressing   Problem: Safety: Goal: Ability to remain free from injury will improve Outcome: Progressing   

## 2019-07-04 ENCOUNTER — Observation Stay (HOSPITAL_COMMUNITY): Payer: Medicare Other

## 2019-07-04 DIAGNOSIS — Z20828 Contact with and (suspected) exposure to other viral communicable diseases: Secondary | ICD-10-CM | POA: Diagnosis not present

## 2019-07-04 DIAGNOSIS — R072 Precordial pain: Secondary | ICD-10-CM | POA: Diagnosis not present

## 2019-07-04 DIAGNOSIS — G4733 Obstructive sleep apnea (adult) (pediatric): Secondary | ICD-10-CM | POA: Diagnosis not present

## 2019-07-04 DIAGNOSIS — R739 Hyperglycemia, unspecified: Secondary | ICD-10-CM | POA: Diagnosis not present

## 2019-07-04 DIAGNOSIS — R0602 Shortness of breath: Secondary | ICD-10-CM | POA: Diagnosis not present

## 2019-07-04 DIAGNOSIS — D72829 Elevated white blood cell count, unspecified: Secondary | ICD-10-CM | POA: Diagnosis not present

## 2019-07-04 DIAGNOSIS — Z23 Encounter for immunization: Secondary | ICD-10-CM | POA: Diagnosis not present

## 2019-07-04 DIAGNOSIS — K219 Gastro-esophageal reflux disease without esophagitis: Secondary | ICD-10-CM | POA: Diagnosis not present

## 2019-07-04 DIAGNOSIS — I10 Essential (primary) hypertension: Secondary | ICD-10-CM | POA: Diagnosis not present

## 2019-07-04 DIAGNOSIS — I2510 Atherosclerotic heart disease of native coronary artery without angina pectoris: Secondary | ICD-10-CM | POA: Diagnosis not present

## 2019-07-04 DIAGNOSIS — R079 Chest pain, unspecified: Secondary | ICD-10-CM | POA: Diagnosis not present

## 2019-07-04 DIAGNOSIS — F419 Anxiety disorder, unspecified: Secondary | ICD-10-CM | POA: Diagnosis not present

## 2019-07-04 DIAGNOSIS — J45909 Unspecified asthma, uncomplicated: Secondary | ICD-10-CM | POA: Diagnosis not present

## 2019-07-04 DIAGNOSIS — E669 Obesity, unspecified: Secondary | ICD-10-CM | POA: Diagnosis not present

## 2019-07-04 DIAGNOSIS — I209 Angina pectoris, unspecified: Secondary | ICD-10-CM | POA: Diagnosis not present

## 2019-07-04 DIAGNOSIS — M545 Low back pain: Secondary | ICD-10-CM | POA: Diagnosis not present

## 2019-07-04 DIAGNOSIS — J449 Chronic obstructive pulmonary disease, unspecified: Secondary | ICD-10-CM | POA: Diagnosis not present

## 2019-07-04 LAB — HEMOGLOBIN A1C
Hgb A1c MFr Bld: 5.7 % — ABNORMAL HIGH (ref 4.8–5.6)
Mean Plasma Glucose: 116.89 mg/dL

## 2019-07-04 LAB — CBC
HCT: 43.7 % (ref 36.0–46.0)
Hemoglobin: 14.1 g/dL (ref 12.0–15.0)
MCH: 29.5 pg (ref 26.0–34.0)
MCHC: 32.3 g/dL (ref 30.0–36.0)
MCV: 91.4 fL (ref 80.0–100.0)
Platelets: 351 10*3/uL (ref 150–400)
RBC: 4.78 MIL/uL (ref 3.87–5.11)
RDW: 13.8 % (ref 11.5–15.5)
WBC: 31.1 10*3/uL — ABNORMAL HIGH (ref 4.0–10.5)
nRBC: 0 % (ref 0.0–0.2)

## 2019-07-04 LAB — BASIC METABOLIC PANEL
Anion gap: 13 (ref 5–15)
BUN: 22 mg/dL (ref 8–23)
CO2: 27 mmol/L (ref 22–32)
Calcium: 10 mg/dL (ref 8.9–10.3)
Chloride: 100 mmol/L (ref 98–111)
Creatinine, Ser: 1.06 mg/dL — ABNORMAL HIGH (ref 0.44–1.00)
GFR calc Af Amer: >60 mL/min (ref >60)
GFR calc non Af Amer: 55 mL/min — ABNORMAL LOW (ref >60)
Glucose, Bld: 132 mg/dL — ABNORMAL HIGH (ref 70–99)
Potassium: 3.8 mmol/L (ref 3.5–5.1)
Sodium: 140 mmol/L (ref 135–145)

## 2019-07-04 LAB — HEPARIN LEVEL (UNFRACTIONATED)
Heparin Unfractionated: 0.43 IU/mL (ref 0.30–0.70)
Heparin Unfractionated: 0.44 IU/mL (ref 0.30–0.70)

## 2019-07-04 LAB — LIPID PANEL
Cholesterol: 189 mg/dL (ref 0–200)
HDL: 55 mg/dL (ref >40)
LDL Cholesterol: 94 mg/dL (ref 0–99)
Total CHOL/HDL Ratio: 3.4 RATIO
Triglycerides: 199 mg/dL — ABNORMAL HIGH (ref <150)
VLDL: 40 mg/dL (ref 0–40)

## 2019-07-04 LAB — HIV ANTIBODY (ROUTINE TESTING W REFLEX): HIV Screen 4th Generation wRfx: NONREACTIVE

## 2019-07-04 MED ORDER — METOPROLOL TARTRATE 25 MG PO TABS: 12.5000 mg | ORAL_TABLET | Freq: Two times a day (BID) | ORAL | 3 refills | Status: DC

## 2019-07-04 MED ORDER — REGADENOSON 0.4 MG/5ML IV SOLN
0.4000 mg | Freq: Once | INTRAVENOUS | Status: DC
  Filled 2019-07-04: qty 5

## 2019-07-04 MED ORDER — HYDRALAZINE HCL 25 MG PO TABS
25.0000 mg | ORAL_TABLET | Freq: Three times a day (TID) | ORAL | Status: DC
  Administered 2019-07-04 (×2): 25 mg via ORAL
  Filled 2019-07-04 (×2): qty 1

## 2019-07-04 MED ORDER — MONTELUKAST SODIUM 10 MG PO TABS: 10.0000 mg | ORAL_TABLET | Freq: Every day | ORAL | Status: DC

## 2019-07-04 MED ORDER — GABAPENTIN 100 MG PO CAPS: 100.0000 mg | ORAL_CAPSULE | Freq: Every day | ORAL | Status: DC

## 2019-07-04 MED ORDER — MAGNESIUM OXIDE 400 (241.3 MG) MG PO TABS
400.0000 mg | ORAL_TABLET | Freq: Every day | ORAL | Status: DC
  Administered 2019-07-04: 12:00:00 400 mg via ORAL
  Filled 2019-07-04: qty 1

## 2019-07-04 MED ORDER — METHOCARBAMOL 500 MG PO TABS: 500.0000 mg | ORAL_TABLET | Freq: Three times a day (TID) | ORAL | Status: DC | PRN

## 2019-07-04 MED ORDER — ZOLPIDEM TARTRATE 5 MG PO TABS
5.0000 mg | ORAL_TABLET | Freq: Once | ORAL | Status: AC
  Administered 2019-07-04: 5 mg via ORAL
  Filled 2019-07-04: qty 1

## 2019-07-04 MED ORDER — VITAMIN D 25 MCG (1000 UNIT) PO TABS
1000.0000 [IU] | ORAL_TABLET | Freq: Every day | ORAL | Status: DC
  Administered 2019-07-04: 12:00:00 1000 [IU] via ORAL
  Filled 2019-07-04: qty 1

## 2019-07-04 MED ORDER — TECHNETIUM TC 99M TETROFOSMIN IV KIT
10.0000 | PACK | Freq: Once | INTRAVENOUS | Status: AC | PRN
  Administered 2019-07-04: 10 via INTRAVENOUS

## 2019-07-04 MED ORDER — ONDANSETRON HCL 4 MG PO TABS: 4.0000 mg | ORAL_TABLET | Freq: Three times a day (TID) | ORAL | Status: DC | PRN

## 2019-07-04 MED ORDER — FENOFIBRATE 54 MG PO TABS
54.0000 mg | ORAL_TABLET | Freq: Every day | ORAL | Status: DC
  Filled 2019-07-04: qty 1

## 2019-07-04 MED ORDER — DULOXETINE HCL 60 MG PO CPEP: 60.0000 mg | ORAL_CAPSULE | Freq: Every day | ORAL | Status: DC

## 2019-07-04 MED ORDER — ALBUTEROL SULFATE (2.5 MG/3ML) 0.083% IN NEBU: 2.5000 mg | INHALATION_SOLUTION | Freq: Four times a day (QID) | RESPIRATORY_TRACT | Status: DC | PRN

## 2019-07-04 MED ORDER — HYDROCHLOROTHIAZIDE 25 MG PO TABS
25.0000 mg | ORAL_TABLET | Freq: Every day | ORAL | Status: DC
  Administered 2019-07-04: 25 mg via ORAL
  Filled 2019-07-04: qty 1

## 2019-07-04 MED ORDER — FERROUS SULFATE 325 (65 FE) MG PO TABS: 325.0000 mg | ORAL_TABLET | Freq: Every day | ORAL | Status: DC

## 2019-07-04 MED ORDER — BUPROPION HCL ER (XL) 150 MG PO TB24
300.0000 mg | ORAL_TABLET | Freq: Every day | ORAL | Status: DC
  Administered 2019-07-04: 300 mg via ORAL
  Filled 2019-07-04: qty 2

## 2019-07-04 MED ORDER — PANTOPRAZOLE SODIUM 40 MG PO TBEC: 40.0000 mg | DELAYED_RELEASE_TABLET | Freq: Every day | ORAL | Status: DC

## 2019-07-04 MED ORDER — REGADENOSON 0.4 MG/5ML IV SOLN
INTRAVENOUS | Status: AC
  Filled 2019-07-04: qty 5

## 2019-07-04 NOTE — Discharge Summary (Signed)
Physician Discharge Summary  Patient ID: [name] MRN: [id number] DOB/AGE: July 07, 1956 63 y.o.  Admit date: 07/03/2019 Discharge date: 07/04/2019  Admission Diagnoses: Acute coronary syndrome CAD Hypertension Chronic back pain Chronic anxiety GERD COPD Tobacco use disorder Obesity  Discharge Diagnoses:  Principal Problem:   Chest pain Active Problems:   Tobacco abuse   OSA on CPAP   Hypertension   Coronary artery disease   Obesity   GERD   COPD   Chronic back pain   Lumbar disc disease   Chronic anxiety   Leukocytosis from cortisone use   Hyperglycemia from cortisone use  Discharged Condition: fair  Hospital Course: 63 years old white female with recent cortisone injection for left knee pain had palpitations and chest pain. She has PMH of CAD, COPD, Hypertension, hyperlipidemia, Obesity, lumbar disc disease, chronic anxiety and GERD. Her EKG showed minimal ST elevations in anterior leads and her troponin I levels were minimally elevated.  Her NM myocardial perfusion stress test was negative for reversible ischemia and had some diaphragmatic attenuation. EF was mildly decreased. Her medications were adjusted. Patient agrees to lifestyle modification. She also agrees to reduce weight to avoid metformin use for borderline hyperglycemia with Hgb A1C of 5.7 %. She did not tolerate ACE inhibitor in past. She will see me in 1 week and primary care in 1 month.  Consults: cardiology  Significant Diagnostic Studies: labs: Elevated WBCs without fever. Normal Hgb and electrolytes. Blood sugar elevated at 132 mg. fasting.  Creatinine 1.06. Hgb A1C of 5.7 %. LDL cholesterol 94 mg. and triglyceride of 199 mg. with HDL of 55 mg. High sensitivity Troponin I was 41 ng/L. SARS coronavirus 2 test was negative.  EKG: NSR, ASWMI, age unknown.  NM myocardial perfusion test: No reversible ischemia. Diaphragmatic attenuation. Mild LV systolic dysfunction.  Treatments: cardiac meds:  metoprolol, diltiazem, fenofibrate, HCTZ, Hydralazine and atorvastatin  Discharge Exam: Blood pressure (!) 148/76, pulse 77, temperature 98.2 F (36.8 C), temperature source Oral, resp. rate 18, height 4\' 11"  (1.499 m), weight 76.4 kg, last menstrual period 09/06/2005, SpO2 93 %. General appearance: alert, cooperative and appears stated age. Head: Normocephalic, atraumatic. Eyes: Blue eyes, pink conjunctiva, corneas clear. PERRL, EOM's intact.  Neck: No adenopathy, no carotid bruit, no JVD, supple, symmetrical, trachea midline and thyroid not enlarged. Resp: Clear to auscultation bilaterally. Cardio: Regular rate and rhythm, S1, S2 normal, II/VI systolic murmur, no click, rub or gallop. GI: Soft, non-tender; bowel sounds normal; no organomegaly. Extremities: No edema, cyanosis or clubbing. Skin: Warm and dry.  Neurologic: Alert and oriented X 3, normal strength and tone. Normal coordination and slow gait.  Disposition: Discharge disposition: 01-Home or Self Care        Allergies as of 07/04/2019      Reactions   Midazolam Hcl Other (See Comments)   HYPER   Prednisone Other (See Comments)   HYPER, depressed, moody   Statins Other (See Comments)   Causes muscle weakness and joint pain   Ace Inhibitors Other (See Comments)   DECREASES HEARTRATE   Amoxicillin Diarrhea   Nsaids Other (See Comments)   AVOIDS; HX GASTRIC ULCER   Sulfa Antibiotics Rash, Other (See Comments)   JOINT PAIN      Medication List    TAKE these medications   albuterol 108 (90 Base) MCG/ACT inhaler Commonly known as: VENTOLIN HFA Inhale 2 puffs into the lungs every 6 (six) hours as needed for wheezing or shortness of breath.   atorvastatin 20 MG tablet  Commonly known as: LIPITOR Take 1 tablet (20 mg total) by mouth daily.   buPROPion 300 MG 24 hr tablet Commonly known as: WELLBUTRIN XL Take 300 mg by mouth daily.   cholecalciferol 1000 units tablet Commonly known as: VITAMIN D Take 1,000  Units by mouth daily.   clonazePAM 1 MG tablet Commonly known as: KLONOPIN Take 1 tablet (1 mg total) by mouth at bedtime.   diltiazem 120 MG 24 hr capsule Commonly known as: CARDIZEM CD Take 1 capsule (120 mg total) by mouth daily. Please keep upcoming appt with Dr. Curt Bears. Thank you   DULoxetine 60 MG capsule Commonly known as: CYMBALTA Take 60 mg by mouth at bedtime.   fenofibrate 48 MG tablet Commonly known as: TRICOR Take 48 mg by mouth daily.   ferrous sulfate 325 (65 FE) MG tablet Take 325 mg by mouth daily with breakfast.   gabapentin 100 MG capsule Commonly known as: NEURONTIN Take 1 capsule (100 mg total) by mouth at bedtime.   hydrALAZINE 25 MG tablet Commonly known as: APRESOLINE Take 25 mg by mouth 3 (three) times daily.   hydrochlorothiazide 25 MG tablet Commonly known as: HYDRODIURIL TAKE 1 TABLET BY MOUTH EVERY DAY   Magnesium 500 MG Caps Take 500 mg by mouth daily.   methocarbamol 500 MG tablet Commonly known as: Robaxin Take 1 tablet (500 mg total) by mouth every 8 (eight) hours as needed for muscle spasms.   metoprolol tartrate 25 MG tablet Commonly known as: LOPRESSOR Take 0.5 tablets (12.5 mg total) by mouth 2 (two) times daily.   montelukast 10 MG tablet Commonly known as: SINGULAIR Take 10 mg by mouth at bedtime.   nitroGLYCERIN 0.4 MG SL tablet Commonly known as: NITROSTAT Place 1 tablet (0.4 mg total) under the tongue every 5 (five) minutes x 3 doses as needed for chest pain.   ondansetron 4 MG tablet Commonly known as: Zofran Take 1 tablet (4 mg total) by mouth every 8 (eight) hours as needed for nausea or vomiting.   pantoprazole 40 MG tablet Commonly known as: PROTONIX Take 1 tablet (40 mg total) by mouth daily as needed. What changed: when to take this   SUPER B COMPLEX/C PO Take 1 tablet by mouth daily.      Follow-up Information    Dixie Dials, MD. Schedule an appointment as soon as possible for a visit in 1 week(s).    Specialty: Cardiology Contact information: [place] [address] [phone]        Maurice Small, MD. Schedule an appointment as soon as possible for a visit in 4 week(s).   Specialty: Family Medicine Contact information: [place] [address] [place] [address] [phone]           Time spent: Review of old chart, current chart, lab, x-ray, cardiac tests and discussion with patient over 60 minutes.  Signed: Birdie Riddle 07/04/2019, 3:41 PM

## 2019-07-04 NOTE — Progress Notes (Signed)
[facility] for Heparin Indication: chest pain/ACS  Allergies  Allergen Reactions  . Midazolam Hcl Other (See Comments)    HYPER  . Prednisone Other (See Comments)    HYPER, depressed, moody  . Statins Other (See Comments)    Causes muscle weakness and joint pain  . Ace Inhibitors Other (See Comments)    DECREASES HEARTRATE  . Amoxicillin Diarrhea  . Nsaids Other (See Comments)    AVOIDS; HX GASTRIC ULCER  . Sulfa Antibiotics Rash and Other (See Comments)    JOINT PAIN    Patient Measurements: Height: 4\' 11"  (149.9 cm) Weight: 168 lb 6.4 oz (76.4 kg) IBW/kg (Calculated) : 43.2  Vital Signs: Temp: 98.2 F (36.8 C) (07/17 0558) Temp Source: Oral (07/17 0558) BP: 166/93 (07/17 0558) Pulse Rate: 70 (07/17 0558)  Labs: Recent Labs    07/03/19 1619 07/03/19 1825 07/03/19 2333 07/04/19 0525 07/04/19 0923  HGB 14.4  --   --  14.1  --   HCT 43.9  --   --  43.7  --   PLT 403*  --   --  351  --   HEPARINUNFRC  --   --  0.43  --  0.44  CREATININE 1.29*  --   --  1.06*  --   TROPONINIHS 31* 41*  --   --   --     Estimated Creatinine Clearance: 47.2 mL/min (A) (by C-G formula based on SCr of 1.06 mg/dL (H)).   Assessment: 63 y.o. female admitted with chest pain and started on heparin.  Heparin is therapeutic on 1100 units/hr.  No bleeding noted.  Goal of Therapy:  Heparin level 0.3-0.7 units/ml Monitor platelets by anticoagulation protocol: Yes   Plan:  Continue Heparin at 1100 units/hr Daily heparin level and CBC.  Follow-up plans for cardiac cath vs stress test  [facility], Pharm.D., BCPS Clinical Pharmacist Clinical phone for 07/04/2019 from 8:30-4:00 is [phone].  **Pharmacist phone directory can now be found on [email or web address] ([email or web address]).  Listed under [place].  07/04/2019 11:20 AM

## 2019-07-04 NOTE — Progress Notes (Signed)
[facility] for Heparin Indication: chest pain/ACS  Allergies  Allergen Reactions  . Midazolam Hcl Other (See Comments)    HYPER  . Prednisone Other (See Comments)    HYPER, depressed, moody  . Statins Other (See Comments)    Causes muscle weakness and joint pain  . Ace Inhibitors Other (See Comments)    DECREASES HEARTRATE  . Amoxicillin Diarrhea  . Nsaids Other (See Comments)    AVOIDS; HX GASTRIC ULCER  . Sulfa Antibiotics Rash and Other (See Comments)    JOINT PAIN    Patient Measurements: Height: 4\' 11"  (149.9 cm) Weight: 168 lb 6.4 oz (76.4 kg) IBW/kg (Calculated) : 43.2  Vital Signs: Temp: 97.9 F (36.6 C) (07/16 2029) Temp Source: Oral (07/16 2029) BP: 168/86 (07/16 2029) Pulse Rate: 92 (07/16 2029)  Labs: Recent Labs    07/03/19 1619 07/03/19 1825 07/03/19 2333  HGB 14.4  --   --   HCT 43.9  --   --   PLT 403*  --   --   HEPARINUNFRC  --   --  0.43  CREATININE 1.29*  --   --   TROPONINIHS 31* 41*  --     Estimated Creatinine Clearance: 38.8 mL/min (A) (by C-G formula based on SCr of 1.29 mg/dL (H)).   Assessment: 63 y.o. female with chest pain for heparin  Goal of Therapy:  Heparin level 0.3-0.7 units/ml Monitor platelets by anticoagulation protocol: Yes   Plan:  Continue Heparin at current rate  Follow-up am labs.   [name] 07/04/2019,12:29 AM

## 2019-07-07 DIAGNOSIS — M2392 Unspecified internal derangement of left knee: Secondary | ICD-10-CM | POA: Insufficient documentation

## 2019-07-11 DIAGNOSIS — T85111A Breakdown (mechanical) of implanted electronic neurostimulator (electrode) of peripheral nerve, initial encounter: Secondary | ICD-10-CM | POA: Diagnosis not present

## 2019-07-11 DIAGNOSIS — N3941 Urge incontinence: Secondary | ICD-10-CM | POA: Diagnosis not present

## 2019-07-11 DIAGNOSIS — T85113A Breakdown (mechanical) of implanted electronic neurostimulator, generator, initial encounter: Secondary | ICD-10-CM | POA: Diagnosis not present

## 2019-07-24 ENCOUNTER — Other Ambulatory Visit: Payer: Self-pay | Admitting: Cardiology

## 2019-07-24 DIAGNOSIS — R350 Frequency of micturition: Secondary | ICD-10-CM | POA: Diagnosis not present

## 2019-07-24 DIAGNOSIS — N3010 Interstitial cystitis (chronic) without hematuria: Secondary | ICD-10-CM | POA: Diagnosis not present

## 2019-07-24 DIAGNOSIS — R351 Nocturia: Secondary | ICD-10-CM | POA: Diagnosis not present

## 2019-07-31 ENCOUNTER — Other Ambulatory Visit: Payer: Self-pay | Admitting: Cardiology

## 2019-07-31 DIAGNOSIS — E785 Hyperlipidemia, unspecified: Secondary | ICD-10-CM

## 2019-07-31 MED ORDER — ATORVASTATIN CALCIUM 20 MG PO TABS: 20.0000 mg | ORAL_TABLET | Freq: Every day | ORAL | 9 refills | Status: DC

## 2019-08-01 DIAGNOSIS — I952 Hypotension due to drugs: Secondary | ICD-10-CM | POA: Insufficient documentation

## 2019-08-01 DIAGNOSIS — I2510 Atherosclerotic heart disease of native coronary artery without angina pectoris: Secondary | ICD-10-CM | POA: Diagnosis not present

## 2019-08-01 DIAGNOSIS — Z79899 Other long term (current) drug therapy: Secondary | ICD-10-CM | POA: Insufficient documentation

## 2019-08-01 DIAGNOSIS — I10 Essential (primary) hypertension: Secondary | ICD-10-CM | POA: Diagnosis not present

## 2019-08-01 DIAGNOSIS — I959 Hypotension, unspecified: Secondary | ICD-10-CM | POA: Diagnosis not present

## 2019-08-01 DIAGNOSIS — F17200 Nicotine dependence, unspecified, uncomplicated: Secondary | ICD-10-CM | POA: Diagnosis not present

## 2019-08-01 DIAGNOSIS — R531 Weakness: Secondary | ICD-10-CM | POA: Diagnosis not present

## 2019-08-01 DIAGNOSIS — R0789 Other chest pain: Secondary | ICD-10-CM | POA: Insufficient documentation

## 2019-08-01 DIAGNOSIS — J449 Chronic obstructive pulmonary disease, unspecified: Secondary | ICD-10-CM | POA: Insufficient documentation

## 2019-08-01 DIAGNOSIS — R0602 Shortness of breath: Secondary | ICD-10-CM | POA: Diagnosis not present

## 2019-08-01 DIAGNOSIS — R400 Somnolence: Secondary | ICD-10-CM | POA: Diagnosis not present

## 2019-08-01 NOTE — ED Triage Notes (Signed)
Pt BIB [facility] from home c/o drowsiness and SOB all day. Lung sounds clear. Pt was put on metoprolol last month and patient thinks that these symptoms are a reaction.

## 2019-08-02 ENCOUNTER — Emergency Department (HOSPITAL_COMMUNITY)
Admission: EM | Admit: 2019-08-02 | Discharge: 2019-08-02 | Disposition: A | Discharge status: Home / Self Care | Payer: Medicare Other | Attending: Emergency Medicine | Admitting: Emergency Medicine

## 2019-08-02 ENCOUNTER — Emergency Department (HOSPITAL_COMMUNITY): Payer: Medicare Other

## 2019-08-02 ENCOUNTER — Other Ambulatory Visit: Payer: Self-pay

## 2019-08-02 ENCOUNTER — Encounter (HOSPITAL_COMMUNITY): Payer: Self-pay

## 2019-08-02 DIAGNOSIS — I952 Hypotension due to drugs: Secondary | ICD-10-CM

## 2019-08-02 DIAGNOSIS — R0789 Other chest pain: Secondary | ICD-10-CM

## 2019-08-02 DIAGNOSIS — R0602 Shortness of breath: Secondary | ICD-10-CM | POA: Diagnosis not present

## 2019-08-02 LAB — COMPREHENSIVE METABOLIC PANEL
ALT: 36 U/L (ref 0–44)
AST: 33 U/L (ref 15–41)
Albumin: 4.3 g/dL (ref 3.5–5.0)
Alkaline Phosphatase: 65 U/L (ref 38–126)
Anion gap: 10 (ref 5–15)
BUN: 24 mg/dL — ABNORMAL HIGH (ref 8–23)
CO2: 30 mmol/L (ref 22–32)
Calcium: 9.9 mg/dL (ref 8.9–10.3)
Chloride: 98 mmol/L (ref 98–111)
Creatinine, Ser: 1.25 mg/dL — ABNORMAL HIGH (ref 0.44–1.00)
GFR calc Af Amer: 52 mL/min — ABNORMAL LOW (ref >60)
GFR calc non Af Amer: 45 mL/min — ABNORMAL LOW (ref >60)
Glucose, Bld: 122 mg/dL — ABNORMAL HIGH (ref 70–99)
Potassium: 3.4 mmol/L — ABNORMAL LOW (ref 3.5–5.1)
Sodium: 138 mmol/L (ref 135–145)
Total Bilirubin: 0.6 mg/dL (ref 0.3–1.2)
Total Protein: 7.1 g/dL (ref 6.5–8.1)

## 2019-08-02 LAB — CBC
HCT: 40.5 % (ref 36.0–46.0)
Hemoglobin: 12.9 g/dL (ref 12.0–15.0)
MCH: 29.7 pg (ref 26.0–34.0)
MCHC: 31.9 g/dL (ref 30.0–36.0)
MCV: 93.1 fL (ref 80.0–100.0)
Platelets: 321 10*3/uL (ref 150–400)
RBC: 4.35 MIL/uL (ref 3.87–5.11)
RDW: 13.9 % (ref 11.5–15.5)
WBC: 19.1 10*3/uL — ABNORMAL HIGH (ref 4.0–10.5)
nRBC: 0 % (ref 0.0–0.2)

## 2019-08-02 LAB — TROPONIN I (HIGH SENSITIVITY)
Troponin I (High Sensitivity): 10 ng/L (ref <18)
Troponin I (High Sensitivity): 11 ng/L (ref <18)

## 2019-08-02 LAB — BRAIN NATRIURETIC PEPTIDE: B Natriuretic Peptide: 39.3 pg/mL (ref 0.0–100.0)

## 2019-08-02 LAB — GLUCOSE, CAPILLARY: Glucose-Capillary: 120 mg/dL — ABNORMAL HIGH (ref 70–99)

## 2019-08-02 MED ORDER — ASPIRIN 81 MG PO CHEW
324.0000 mg | CHEWABLE_TABLET | Freq: Once | ORAL | Status: AC
  Administered 2019-08-02: 324 mg via ORAL
  Filled 2019-08-02: qty 4

## 2019-08-02 MED ORDER — ACETAMINOPHEN 325 MG PO TABS
650.0000 mg | ORAL_TABLET | Freq: Once | ORAL | Status: AC
  Administered 2019-08-02: 06:00:00 650 mg via ORAL
  Filled 2019-08-02: qty 2

## 2019-08-02 NOTE — ED Provider Notes (Signed)
Patient seen/examined in the Emergency Department in conjunction with Advanced Practice Provider [place] Patient reports feeling lightheaded earlier after taking medications and was noted to be hypotensive which is improving She also mentions brief CP that started after arriving to the ER Exam : awake/alert, no distress, no murmurs no added lung sounds Plan: labs pending at this time     Ripley Fraise, MD 08/02/19 [phone]

## 2019-08-02 NOTE — Discharge Instructions (Addendum)
You have been diagnosed today with Low blood pressure due to overmedication and atypical chest pain  At this time there does not appear to be the presence of an emergent medical condition, however there is always the potential for conditions to change. Please read and follow the below instructions.  Please return to the Emergency Department immediately for any new or worsening symptoms or if your symptoms return. Please be sure to follow up with your Primary Care Provider within one week regarding your visit today; please call their office to schedule an appointment even if you are feeling better for a follow-up visit. Please only take your medications as directed by your primary care provider, do not take all these medications at once as this may have caused your lightheadedness earlier.  Please be sure to drink plenty of water and get plenty of rest. Call your cardiologist office today to schedule a follow-up appointment.  Get help right away if: Your chest pain is worse. You have a cough that gets worse, or you cough up blood. You have very bad (severe) pain in your belly (abdomen). You pass out (faint). You have either of these for no clear reason: Sudden chest discomfort. Sudden discomfort in your arms, back, neck, or jaw. You have shortness of breath at any time. You suddenly start to sweat, or your skin gets clammy. You feel sick to your stomach (nauseous). You throw up (vomit). You suddenly feel lightheaded or dizzy. You feel very weak or tired. Your heart starts to beat fast, or it feels like it is skipping beats. Any new/concerning or worsening symptoms  Please read the additional information packets attached to your discharge summary.  Do not take your medicine if  develop an itchy rash, swelling in your mouth or lips, or difficulty breathing; call 911 and seek immediate emergency medical attention if this occurs.

## 2019-08-02 NOTE — ED Provider Notes (Signed)
Care handoff received from [facility], PA-C at shift change, please see her notes for further details.  In short 63 year old female presents today for lightheadedness after taking home medications simultaneously, 25 mg hydralazine, 12.5 mg metoprolol, 4 mg of tizanidine.  Shortly after patient was lightheaded with hypotension systolic in the 35K at home, EMS was activated.  The symptoms began to improve however after ED arrival patient reports some chest pain that feels like her "angina".  Reassuring work-up thus far, plan of care at shift change is to follow delta troponin, if reassuring discharged to home with cardiology follow-up and encourage patient not to take all of her medications at the same time and to use her CPAP as directed.  Patient had admission for chest pain last month with stress test without reversible ischemia. Physical Exam  BP 137/65   Pulse 66   Temp 97.7 F (36.5 C) (Oral)   Resp 20   Ht 4\' 11"  (1.499 m)   Wt 74.4 kg   LMP 09/06/2005   SpO2 96%   BMI 33.12 kg/m   Physical Exam Constitutional:      General: She is not in acute distress.    Appearance: Normal appearance. She is well-developed. She is not ill-appearing or diaphoretic.  HENT:     Head: Normocephalic and atraumatic.     Right Ear: External ear normal.     Left Ear: External ear normal.     Nose: Nose normal.  Eyes:     General: Vision grossly intact. Gaze aligned appropriately.     Pupils: Pupils are equal, round, and reactive to light.  Neck:     Musculoskeletal: Normal range of motion.     Trachea: Trachea and phonation normal. No tracheal deviation.  Pulmonary:     Effort: Pulmonary effort is normal. No respiratory distress.  Musculoskeletal: Normal range of motion.  Skin:    General: Skin is warm and dry.  Neurological:     Mental Status: She is alert.     GCS: GCS eye subscore is 4. GCS verbal subscore is 5. GCS motor subscore is 6.     Comments: Speech is clear and goal oriented,  follows commands Major Cranial nerves without deficit, no facial droop Moves extremities without ataxia, coordination intact  Psychiatric:        Behavior: Behavior normal.    ED Course/Procedures     Procedures  MDM  Initial high-sensitivity troponin: 10 Delta high-sensitivity troponin: 11 BNP within normal limits CBC with leukocytosis of 19.1 CMP with creatinine of 1.25, BUN 24, appears baseline CBG of 120 CXR:  IMPRESSION:  No acute chest findings.  EKG: Sinus rhythm Anterior infarct, old No significant change since from July 2020 Confirmed by Ripley Fraise [phone]) on 08/02/2019 6:13:59 AM - Patient reassessed she is sitting comfortably on the edge of the bed and in no acute distress.  She states understanding of results above and has no further questions, she would like to be discharged.  She denies any pain, shortness of breath or lightheadedness.  She reports that she feels well.  Suspect patient's lightheadedness and hypotension due to polypharmacy, hypotension is resolved with rest here in the ED.  As to patient's atypical chest pain she has reassuring work-up as above, do not suspect ACS, no sign of DVT, no tachycardia or hypoxia do not suspect PE, dissection or other acute cardiopulmonary etiologies at this time.  At this time there does not appear to be any evidence of an acute emergency  medical condition and the patient appears stable for discharge with appropriate outpatient follow up. Diagnosis was discussed with patient who verbalizes understanding of care plan and is agreeable to discharge. I have discussed return precautions with patient who verbalizes understanding of return precautions. Patient encouraged to follow-up with their PCP and Cardiologist. All questions answered.  Patient's case discussed with Dr. Gilford Raid who agrees with plan to discharge with follow-up.   Note: Portions of this report may have been transcribed using voice recognition software. Every  effort was made to ensure accuracy; however, inadvertent computerized transcription errors may still be present.   Gari Crown 08/02/19 1041    Isla Pence, MD 08/02/19 1256

## 2019-08-02 NOTE — ED Provider Notes (Signed)
[facility] DEPT Provider Note   CSN: [id number] Arrival date & time: 08/01/19  2351    History   Chief Complaint Chief Complaint  Patient presents with  . Shortness of Breath    HPI [name] is a 63 y.o. female with a history of SVT on metoprolol, CAD, HTN, HLD who presents to the emergency department with a chief complaint of drowsiness.  The patient reports that she took her evening dose of 25 mg of hydralazine, 12.5 mg of metoprolol, and 4 mg of tizanidine prior to arrival at the same time.  She reports that she does not normally take these medications at the same time and would usually only take tizanidine right before she goes to bed.  She reports that she began feeling "dopey".  The triage note indicates that the patient was also endorsing shortness of breath, which she denies to me at this time.  She reports that she checked her blood pressure at home and obtained a reading of 90s/50s so she called EMS.   She reports worsening generalized weakness and daytime somnolence for several weeks, but reports that symptoms have been gradually worsening.  She does report that she has a history of sleep apnea and is noncompliant with her CPAP.  She reports that she spent most of the day in bed because she was feeling so drowsy.  She reports that this is not uncommon as she will sleep most of the day several times a week.  She is concerned her symptoms may be related to her medication regimen.  She states that while she was in the waiting room in the ER that she developed "angina" in the middle of her chest.  She characterizes the pain as dull, throbbing pain that is nonradiating.  Pain has been constant since onset.  Pain is aggravated by laying flat.  No alleviating symptoms.  She reports that quality of pain is similar with previous episodes of chest pain.  She also reports that she is having some discomfort to the left side of her neck, but thinks the pain  may be related to her chronic arthritis.  No treatment prior to arrival.  She also endorses some epigastric discomfort, but denies palpitations, nausea, vomiting, diarrhea, constipation, dizziness, lightheadedness, headache, cough, fever, or chills.  She reports that she does intermittently have hot flashes, but she is unsure if she has had diaphoresis or felt clammy with previous episodes of chest pain.  She was hospitalized in July 16-17, 2020 and stress test was negative for reversible ischemia.  EF was mildly decreased.  She reports that her home hydralazine was increased from twice daily to 3 times daily and she was started on metoprolol during this hospitalization.  She is a current, every day 2 pack/day smoker.  Per chart review: Additional studies/ records that were reviewed today include: Cardiac cath 08/23/16, TTE 08/24/16   Prox RCA lesion, 45 %stenosed.  Ost LAD to Prox LAD lesion, 25 %stenosed.  Mid LAD to Dist LAD lesion, 40 %stenosed.  Dist LAD lesion, 60 %stenosed.  Ost 1st Mrg lesion, 20 %stenosed.  The left ventricular systolic function is normal.  LV end diastolic pressure is normal.  Ost 2nd Mrg lesion, 90 %stenosed.  - Left ventricle: The cavity size was normal. There was mild concentric hypertrophy. Systolic function was normal. The estimated ejection fraction was in the range of 60% to 65%. Wall motion was normal; there were no regional wall motion abnormalities. Doppler parameters are  consistent with abnormal left ventricular relaxation (grade 1 diastolic dysfunction). - Mitral valve: Calcified annulus.  Holter 10/03/17 personally reviewed Minimum HR: 51 BPM at 6:04:06 AM Maximum HR: 113 BPM at 4:02:31 PM Average HR: 79 BPM Sinus rhythm 1.4% PACs APCs occasionally associated with palpitations     The history is provided by the patient. No language interpreter was used.    Past Medical History:  Diagnosis Date  . Agoraphobia without  history of panic disorder   . Anginal pain ([place])   . Anxiety   . Arthritis    "qwhere" (04/03/2018)  . Childhood asthma   . Chronic bronchitis ([place])   . Chronic lower back pain   . COPD (chronic obstructive pulmonary disease) ([place])   . Coronary artery disease CARDIOLOGIST- DR  Doylene Canard  (VISIT 03-30-11 W/ CHART)   NON-OBS. CAD   (STRESS TEST NOV. 2011  . Daily headache   . DDD (degenerative disc disease)   . Depression   . DJD (degenerative joint disease)    JOINT PAIN  . Dyspnea    "anytime but mostly on exertion and smoking"  . Fibromyalgia   . Frequency of urination   . GERD (gastroesophageal reflux disease) AND HIATIAL HERNIA   CONTROLLED W/ NEXIUM  . Heart murmur   . Hemorrhoids   . High cholesterol   . History of blood transfusion    "related to anemia" (04/03/2018)  . History of gastric ulcer 2004  . History of hiatal hernia   . Hypertension   . IBS (irritable bowel syndrome)   . Incisional pain s/p interstim implant 1st stage--- 12-07-11    left upper buttock-- pt states dressing clean dry and intact (on 12-08-11)  . Insomnia   . Iron deficiency anemia   . Nocturia   . Non-productive cough   . Numbness in both hands AT TIMES  . OSA (obstructive sleep apnea)    "suppose to wear mask; I don't" (04/03/2018)  . Urge urinary incontinence     Patient Active Problem List   Diagnosis Date Noted  . Low back pain 11/27/2018  . Acute coronary syndrome ([place]) 04/02/2018  . Hyperlipidemia 08/06/2017  . Influenza A 01/01/2017  . COPD exacerbation ([place]) 01/01/2017  . Acute renal failure (ARF) ([place]) 01/01/2017  . Dehydration 01/01/2017  . Unstable angina ([place]) 10/14/2016  . Chest pain 08/22/2016  . SVT (supraventricular tachycardia) ([place]) 08/22/2016  . OSA on CPAP   . Hypertension   . GERD (gastroesophageal reflux disease)   . Depression   . Coronary artery disease   . Anxiety   . Pain in the chest   . Hypokalemia   . Carpal tunnel syndrome, bilateral 02/18/2016  .  Leukocytosis 05/26/2014  . Tobacco abuse 05/26/2014  . Urge incontinence 12/14/2011    Past Surgical History:  Procedure Laterality Date  . ANTERIOR CERVICAL DECOMP/DISCECTOMY FUSION  2008   C4 - T1 ("screws from 1st OR came out")  . ANTERIOR CERVICAL DECOMP/DISCECTOMY FUSION  2002   C4 - 7  . ANTERIOR LATERAL LUMBAR FUSION WITH PERCUTANEOUS SCREW 2 LEVEL Left 11/27/2018   Procedure: XLIF Lumbar 3-5, posterior spinal fusion L3-5;  Surgeon: Melina Schools, MD;  Location: [facility];  Service: Orthopedics;  Laterality: Left;  5.5 hrs for entire procedure  . APPENDECTOMY  1982  . BACK SURGERY    . CARDIAC CATHETERIZATION  2003  . CARDIAC CATHETERIZATION N/A 08/23/2016   Procedure: Left Heart Cath and Coronary Angiography;  Surgeon: Dixie Dials, MD;  Location:  [facility] INVASIVE CV LAB;  Service: Cardiovascular;  Laterality: N/A;  . CARPAL TUNNEL RELEASE Right   . CATARACT EXTRACTION W/ INTRAOCULAR LENS  IMPLANT, BILATERAL  2016-2018  . CHOLECYSTECTOMY OPEN  1982  . CYSTO/ HOD/ BLADDER BX  2006  . CYSTO/ HOD/ BLADDER BX/ FULGERATION  06-12-2011  . FINGER SURGERY Right 2001   "replaced worn cartilage on thumb w/tendons"  . HYSTEROSCOPY W/D&C  2005  . INTERSTIM IMPLANT PLACEMENT  12/07/2011   Procedure: Barrie Lyme IMPLANT FIRST STAGE;  Surgeon: [name], MD;  Location: [facility];  Service: Urology;  Laterality: Right;  . INTERSTIM IMPLANT PLACEMENT  12/14/2011   Procedure: Barrie Lyme IMPLANT SECOND STAGE;  Surgeon: [name], MD;  Location: [facility];  Service: Urology;  Laterality: Right;  rad tech ok by vickie at [facility]  . LUMBAR LAMINECTOMY  2007   L3 - 5  . TONSILLECTOMY AND ADENOIDECTOMY  1965  . TUBAL LIGATION    . WRIST SURGERY Left    TFCC ("tendon repair")     OB History   No obstetric history on file.      Home Medications    Prior to Admission medications   Medication Sig Start Date End Date Taking? Authorizing Provider   acetaminophen (TYLENOL) 650 MG CR tablet Take 1,300 mg by mouth every 8 (eight) hours as needed for pain.   Yes [provider]  albuterol (PROVENTIL HFA;VENTOLIN HFA) 108 (90 BASE) MCG/ACT inhaler Inhale 2 puffs into the lungs every 6 (six) hours as needed for wheezing or shortness of breath.   Yes [provider]  atorvastatin (LIPITOR) 20 MG tablet Take 1 tablet (20 mg total) by mouth daily. 07/31/19  Yes Camnitz, Will Hassell Done, MD  buPROPion (WELLBUTRIN XL) 300 MG 24 hr tablet Take 300 mg by mouth daily.   Yes [provider]  cholecalciferol (VITAMIN D) 1000 UNITS tablet Take 1,000 Units by mouth daily.    Yes [provider]  clonazePAM (KLONOPIN) 1 MG tablet Take 1 tablet (1 mg total) by mouth at bedtime. 11/29/18  Yes Melina Schools, MD  diltiazem (CARDIZEM CD) 120 MG 24 hr capsule Take 1 capsule (120 mg total) by mouth daily. 07/24/19  Yes Camnitz, Will Hassell Done, MD  DULoxetine (CYMBALTA) 60 MG capsule Take 60 mg by mouth at bedtime.    Yes [provider]  fenofibrate (TRICOR) 48 MG tablet Take 48 mg by mouth daily.  04/30/15  Yes [provider]  ferrous sulfate 325 (65 FE) MG tablet Take 325 mg by mouth daily with breakfast.   Yes [provider]  gabapentin (NEURONTIN) 100 MG capsule Take 1 capsule (100 mg total) by mouth at bedtime. 11/29/18  Yes Melina Schools, MD  hydrALAZINE (APRESOLINE) 25 MG tablet Take 25 mg by mouth 3 (three) times daily.    Yes [provider]  hydrochlorothiazide (HYDRODIURIL) 25 MG tablet TAKE 1 TABLET BY MOUTH EVERY DAY Patient taking differently: Take 25 mg by mouth daily.  05/07/19  Yes Camnitz, Ocie Doyne, MD  Magnesium 500 MG CAPS Take 500 mg by mouth daily.    Yes [provider]  methocarbamol (ROBAXIN) 500 MG tablet Take 1 tablet (500 mg total) by mouth every 8 (eight) hours as needed for muscle spasms. 11/27/18  Yes Melina Schools, MD  metoprolol tartrate (LOPRESSOR) 25 MG  tablet Take 0.5 tablets (12.5 mg total) by mouth 2 (two) times daily. 07/04/19  Yes Dixie Dials, MD  montelukast (SINGULAIR) 10  MG tablet Take 10 mg by mouth at bedtime.   Yes [provider]  pantoprazole (PROTONIX) 40 MG tablet Take 1 tablet (40 mg total) by mouth daily as needed. Patient taking differently: Take 40 mg by mouth daily as needed. Heart Burn 08/24/16  Yes Eugenie Filler, MD  SUPER B COMPLEX/C PO Take 1 tablet by mouth daily.    Yes [provider]  tiZANidine (ZANAFLEX) 4 MG tablet Take 4 mg by mouth every 6 (six) hours as needed for muscle spasms.   Yes [provider]  nitroGLYCERIN (NITROSTAT) 0.4 MG SL tablet Place 1 tablet (0.4 mg total) under the tongue every 5 (five) minutes x 3 doses as needed for chest pain. 08/24/16   Eugenie Filler, MD  ondansetron (ZOFRAN) 4 MG tablet Take 1 tablet (4 mg total) by mouth every 8 (eight) hours as needed for nausea or vomiting. Patient not taking: Reported on 08/02/2019 11/27/18   Melina Schools, MD    Family History Family History  Problem Relation Age of Onset  . Hypertension Father   . Heart disease Father   . Kidney failure Father   . Breast cancer Mother   . Hypertension Mother   . Coronary artery disease Mother   . Alzheimer's disease Mother   . Hypertension Brother   . Hypertension Sister   . Hypertension Daughter     Social History Social History   Tobacco Use  . Smoking status: Current Every Day Smoker    Packs/day: 2.00    Years: 43.00    Pack years: 86.00  . Smokeless tobacco: Never Used  Substance Use Topics  . Alcohol use: Not Currently    Alcohol/week: 0.0 standard drinks    Comment: 04/03/2018 "nothing in years"  . Drug use: Never     Allergies   Midazolam hcl, Prednisone, Statins, Ace inhibitors, Amoxicillin, Nsaids, and Sulfa antibiotics   Review of Systems Review of Systems  Constitutional: Positive for fatigue. Negative for activity change, chills, diaphoresis  and unexpected weight change.  Respiratory: Negative for cough, choking, shortness of breath and wheezing.   Cardiovascular: Positive for chest pain. Negative for palpitations.  Gastrointestinal: Positive for abdominal pain (epigastric pain). Negative for blood in stool, constipation, nausea and vomiting.  Genitourinary: Negative for dysuria.  Musculoskeletal: Negative for back pain, myalgias, neck pain and neck stiffness.  Skin: Negative for rash and wound.  Allergic/Immunologic: Negative for immunocompromised state.  Neurological: Positive for weakness (generalized). Negative for dizziness, seizures, syncope, numbness and headaches.  Psychiatric/Behavioral: Negative for confusion.   Physical Exam Updated Vital Signs BP (!) 141/72   Pulse 63   Temp 97.7 F (36.5 C) (Oral)   Resp 17   Ht 4\' 11"  (1.499 m)   Wt 74.4 kg   LMP 09/06/2005   SpO2 96%   BMI 33.12 kg/m   Physical Exam Vitals signs and nursing note reviewed.  Constitutional:      General: She is not in acute distress.    Appearance: She is obese. She is not ill-appearing, toxic-appearing or diaphoretic.  HENT:     Head: Normocephalic.     Right Ear: There is no impacted cerumen.     Left Ear: There is no impacted cerumen.     Mouth/Throat:     Mouth: Mucous membranes are moist.  Eyes:     Conjunctiva/sclera: Conjunctivae normal.  Neck:     Musculoskeletal: Normal range of motion and neck supple. No muscular tenderness.  Cardiovascular:  Rate and Rhythm: Normal rate and regular rhythm.     Pulses: Normal pulses.     Heart sounds: Normal heart sounds. No murmur. No friction rub. No gallop.   Pulmonary:     Effort: Pulmonary effort is normal. No respiratory distress.     Breath sounds: No stridor. No wheezing, rhonchi or rales.  Chest:     Chest wall: No tenderness.  Abdominal:     General: There is no distension.     Palpations: Abdomen is soft. There is no mass.     Tenderness: There is no abdominal  tenderness. There is no right CVA tenderness, left CVA tenderness, guarding or rebound.     Hernia: No hernia is present.  Musculoskeletal:     Comments: Multiple varicosities noted in the bilateral lower extremities.  No tenderness to palpation in the bilateral calves.  Trace edema in the bilateral lower extremities.  Skin:    General: Skin is warm.     Findings: No rash.  Neurological:     Mental Status: She is alert.  Psychiatric:        Behavior: Behavior normal.    ED Treatments / Results  Labs (all labs ordered are listed, but only abnormal results are displayed) Labs Reviewed  GLUCOSE, CAPILLARY - Abnormal; Notable for the following components:      Result Value   Glucose-Capillary 120 (*)    All other components within normal limits  CBC  COMPREHENSIVE METABOLIC PANEL  BRAIN NATRIURETIC PEPTIDE  CBG MONITORING, ED  TROPONIN I (HIGH SENSITIVITY)  TROPONIN I (HIGH SENSITIVITY)    EKG EKG Interpretation  Date/Time:  Saturday August 02 2019 06:03:49 EDT Ventricular Rate:  64 PR Interval:    QRS Duration: 106 QT Interval:  471 QTC Calculation: 486 R Axis:   79 Text Interpretation:  Sinus rhythm Anterior infarct, old No significant change since from July 2020 Confirmed by Ripley Fraise [phone]) on 08/02/2019 6:13:59 AM   Radiology Dg Chest 2 View  Result Date: 08/02/2019 CLINICAL DATA:  Shortness of breath. EXAM: CHEST - 2 VIEW COMPARISON:  Radiograph 09/10/2018 FINDINGS: The cardiomediastinal contours are normal. Mild chronic interstitial coarsening. Pulmonary vasculature is normal. No consolidation, pleural effusion, or pneumothorax. No acute osseous abnormalities are seen. Surgical hardware in the lower cervical spine and lumbar spine is partially included. IMPRESSION: No acute chest findings. Electronically Signed   By: Keith Rake M.D.   On: 08/02/2019 00:40    Procedures Procedures (including critical care time)  Medications Ordered in ED Medications   aspirin chewable tablet 324 mg (324 mg Oral Given 08/02/19 0531)  acetaminophen (TYLENOL) tablet 650 mg (650 mg Oral Given 08/02/19 0531)     Initial Impression / Assessment and Plan / ED Course  I have reviewed the triage vital signs and the nursing notes.  Pertinent labs & imaging results that were available during my care of the patient were reviewed by me and considered in my medical decision making (see chart for details).        63 year old female with a history of SVT on metoprolol, CAD, HTN, HLD who presents to the ER by EMS after she checked her blood pressure at home and it was 90s/50s after taking her home tizanidine, metoprolol, and hydralazine at the same time.  While she was in the ER, she developed central chest pain that she characterizes as "angina".  EKG was sinus rhythm.  Chest x-ray is unremarkable.  The patient was seen and independently evaluated by  Dr. Christy Gentles, attending physician.  Initial blood pressure in the ER was 103/57, but is currently 147/72.  There are several explanations that could explain her symptoms today, including polypharmacy and noncompliance with CPAP.  Chest pain sounds atypical for ACS as she reports that it is worse with laying flat and is not associated with exertion.  HEAR score is 4 and the patient was admitted approximately 4 weeks ago where she had a negative stress test.  She had a left heart cath in 2017.  Will check basic labs and delta troponin.  If labs are reassuring and delta troponin is unremarkable, patient can likely be discharged home with cardiology follow-up and encouraged not to take multiple medications at the same time.  She should also be encouraged to comply with CPAP to improve daytime drowsiness.  Patient care transferred to [facility] at the end of my shift. Patient presentation, ED course, and plan of care discussed with review of all pertinent labs and imaging. Please see his/her note for further details regarding further  ED course and disposition.   Final Clinical Impressions(s) / ED Diagnoses   Final diagnoses:  None    ED Discharge Orders    None       Joanne Gavel, PA-C 08/02/19 0964    Ripley Fraise, MD 08/03/19 [phone]

## 2019-08-11 DIAGNOSIS — N3010 Interstitial cystitis (chronic) without hematuria: Secondary | ICD-10-CM | POA: Diagnosis not present

## 2019-08-11 DIAGNOSIS — F17201 Nicotine dependence, unspecified, in remission: Secondary | ICD-10-CM | POA: Diagnosis not present

## 2019-08-11 DIAGNOSIS — J449 Chronic obstructive pulmonary disease, unspecified: Secondary | ICD-10-CM | POA: Diagnosis not present

## 2019-08-11 DIAGNOSIS — Z79899 Other long term (current) drug therapy: Secondary | ICD-10-CM | POA: Diagnosis not present

## 2019-08-11 DIAGNOSIS — F331 Major depressive disorder, recurrent, moderate: Secondary | ICD-10-CM | POA: Diagnosis not present

## 2019-08-11 DIAGNOSIS — I10 Essential (primary) hypertension: Secondary | ICD-10-CM | POA: Diagnosis not present

## 2019-08-15 DIAGNOSIS — Z23 Encounter for immunization: Secondary | ICD-10-CM | POA: Diagnosis not present

## 2019-08-15 DIAGNOSIS — E538 Deficiency of other specified B group vitamins: Secondary | ICD-10-CM | POA: Diagnosis not present

## 2019-08-15 DIAGNOSIS — R7303 Prediabetes: Secondary | ICD-10-CM | POA: Diagnosis not present

## 2019-08-15 DIAGNOSIS — I10 Essential (primary) hypertension: Secondary | ICD-10-CM | POA: Diagnosis not present

## 2019-08-15 DIAGNOSIS — F331 Major depressive disorder, recurrent, moderate: Secondary | ICD-10-CM | POA: Diagnosis not present

## 2019-08-15 DIAGNOSIS — E611 Iron deficiency: Secondary | ICD-10-CM | POA: Diagnosis not present

## 2019-08-15 DIAGNOSIS — E782 Mixed hyperlipidemia: Secondary | ICD-10-CM | POA: Diagnosis not present

## 2019-08-15 DIAGNOSIS — E559 Vitamin D deficiency, unspecified: Secondary | ICD-10-CM | POA: Diagnosis not present

## 2019-08-29 DIAGNOSIS — N3010 Interstitial cystitis (chronic) without hematuria: Secondary | ICD-10-CM | POA: Diagnosis not present

## 2019-09-01 DIAGNOSIS — R197 Diarrhea, unspecified: Secondary | ICD-10-CM | POA: Diagnosis not present

## 2019-09-03 DIAGNOSIS — R197 Diarrhea, unspecified: Secondary | ICD-10-CM | POA: Diagnosis not present

## 2019-10-01 DIAGNOSIS — R197 Diarrhea, unspecified: Secondary | ICD-10-CM | POA: Diagnosis not present

## 2019-10-22 DIAGNOSIS — M25562 Pain in left knee: Secondary | ICD-10-CM | POA: Diagnosis not present

## 2019-10-22 DIAGNOSIS — M5416 Radiculopathy, lumbar region: Secondary | ICD-10-CM | POA: Diagnosis not present

## 2019-10-22 DIAGNOSIS — M170 Bilateral primary osteoarthritis of knee: Secondary | ICD-10-CM | POA: Diagnosis not present

## 2019-10-22 DIAGNOSIS — M1711 Unilateral primary osteoarthritis, right knee: Secondary | ICD-10-CM | POA: Diagnosis not present

## 2019-10-22 DIAGNOSIS — M545 Low back pain: Secondary | ICD-10-CM | POA: Diagnosis not present

## 2019-10-22 DIAGNOSIS — M1712 Unilateral primary osteoarthritis, left knee: Secondary | ICD-10-CM | POA: Diagnosis not present

## 2019-10-22 DIAGNOSIS — M25561 Pain in right knee: Secondary | ICD-10-CM | POA: Diagnosis not present

## 2019-10-27 DIAGNOSIS — Z981 Arthrodesis status: Secondary | ICD-10-CM | POA: Diagnosis not present

## 2019-10-30 ENCOUNTER — Other Ambulatory Visit: Payer: Self-pay | Admitting: Orthopedic Surgery

## 2019-10-30 DIAGNOSIS — M1711 Unilateral primary osteoarthritis, right knee: Secondary | ICD-10-CM | POA: Insufficient documentation

## 2019-10-30 DIAGNOSIS — Z981 Arthrodesis status: Secondary | ICD-10-CM

## 2019-10-30 DIAGNOSIS — M1712 Unilateral primary osteoarthritis, left knee: Secondary | ICD-10-CM | POA: Insufficient documentation

## 2019-11-05 DIAGNOSIS — M1712 Unilateral primary osteoarthritis, left knee: Secondary | ICD-10-CM | POA: Diagnosis not present

## 2019-11-05 DIAGNOSIS — M25561 Pain in right knee: Secondary | ICD-10-CM | POA: Diagnosis not present

## 2019-11-05 DIAGNOSIS — M1711 Unilateral primary osteoarthritis, right knee: Secondary | ICD-10-CM | POA: Diagnosis not present

## 2019-11-05 DIAGNOSIS — M170 Bilateral primary osteoarthritis of knee: Secondary | ICD-10-CM | POA: Diagnosis not present

## 2019-11-05 DIAGNOSIS — M25562 Pain in left knee: Secondary | ICD-10-CM | POA: Diagnosis not present

## 2019-11-05 NOTE — Discharge Instructions (Signed)
Intra-Discal Anesthetic Injection Discharge Instruction Sheet  1. You may resume a regular diet and any medications that you routinely take (including pain medications).  2. No driving day of procedure.  3. Light activity throughout the rest of the day.  Do not do any strenuous work, exercise, bending or lifting.  The day following the procedure, you can resume normal physical activity but you should refrain from exercising or physical therapy for at least three days thereafter.   Please contact our office at [phone] for the following symptoms:  Fever greater than 100 degrees.  Headaches unresolved with medication after 2-3 days. 

## 2019-11-06 ENCOUNTER — Ambulatory Visit
Admission: RE | Admit: 2019-11-06 | Discharge: 2019-11-06 | Disposition: A | Discharge status: Home / Self Care | Payer: Medicare Other | Source: Ambulatory Visit | Attending: Orthopedic Surgery | Admitting: Orthopedic Surgery

## 2019-11-06 ENCOUNTER — Other Ambulatory Visit: Payer: Self-pay

## 2019-11-06 DIAGNOSIS — Z981 Arthrodesis status: Secondary | ICD-10-CM

## 2019-11-06 DIAGNOSIS — M545 Low back pain: Secondary | ICD-10-CM | POA: Diagnosis not present

## 2019-11-06 MED ORDER — VANCOMYCIN HCL IN DEXTROSE 1-5 GM/200ML-% IV SOLN
1000.0000 mg | Freq: Once | INTRAVENOUS | Status: AC
  Administered 2019-11-06: 09:00:00 1000 mg via INTRAVENOUS

## 2019-11-06 MED ORDER — IOPAMIDOL (ISOVUE-M 200) INJECTION 41%
1.0000 mL | Freq: Once | INTRAMUSCULAR | Status: AC
  Administered 2019-11-06: 11:00:00 1 mL

## 2019-11-06 MED ORDER — METHYLPREDNISOLONE ACETATE 40 MG/ML INJ SUSP (RADIOLOG: 120.0000 mg | Freq: Once | INTRAMUSCULAR | Status: DC

## 2019-11-12 DIAGNOSIS — M25561 Pain in right knee: Secondary | ICD-10-CM | POA: Diagnosis not present

## 2019-11-12 DIAGNOSIS — M170 Bilateral primary osteoarthritis of knee: Secondary | ICD-10-CM | POA: Diagnosis not present

## 2019-11-12 DIAGNOSIS — M25562 Pain in left knee: Secondary | ICD-10-CM | POA: Diagnosis not present

## 2019-11-19 DIAGNOSIS — M1711 Unilateral primary osteoarthritis, right knee: Secondary | ICD-10-CM | POA: Diagnosis not present

## 2019-11-19 DIAGNOSIS — M25562 Pain in left knee: Secondary | ICD-10-CM | POA: Diagnosis not present

## 2019-11-19 DIAGNOSIS — M25561 Pain in right knee: Secondary | ICD-10-CM | POA: Diagnosis not present

## 2019-11-19 DIAGNOSIS — M1712 Unilateral primary osteoarthritis, left knee: Secondary | ICD-10-CM | POA: Diagnosis not present

## 2019-11-19 DIAGNOSIS — M170 Bilateral primary osteoarthritis of knee: Secondary | ICD-10-CM | POA: Diagnosis not present

## 2019-11-25 ENCOUNTER — Other Ambulatory Visit: Payer: Self-pay | Admitting: Orthopedic Surgery

## 2019-11-25 DIAGNOSIS — M545 Low back pain, unspecified: Secondary | ICD-10-CM

## 2019-11-25 DIAGNOSIS — Z981 Arthrodesis status: Secondary | ICD-10-CM | POA: Diagnosis not present

## 2019-12-05 ENCOUNTER — Ambulatory Visit
Admission: RE | Admit: 2019-12-05 | Discharge: 2019-12-05 | Disposition: A | Discharge status: Home / Self Care | Payer: Medicare Other | Source: Ambulatory Visit | Attending: Orthopedic Surgery | Admitting: Orthopedic Surgery

## 2019-12-05 DIAGNOSIS — M545 Low back pain, unspecified: Secondary | ICD-10-CM

## 2020-01-06 ENCOUNTER — Ambulatory Visit (INDEPENDENT_AMBULATORY_CARE_PROVIDER_SITE_OTHER): Payer: Medicare Other | Admitting: Vascular Surgery

## 2020-01-06 ENCOUNTER — Other Ambulatory Visit: Payer: Self-pay

## 2020-01-06 ENCOUNTER — Encounter: Payer: Self-pay | Admitting: Vascular Surgery

## 2020-01-06 VITALS — BP 117/67 | HR 60 | Temp 97.2°F | Resp 16 | Ht 59.0 in | Wt 166.0 lb

## 2020-01-06 DIAGNOSIS — M5442 Lumbago with sciatica, left side: Secondary | ICD-10-CM | POA: Diagnosis not present

## 2020-01-06 DIAGNOSIS — M5441 Lumbago with sciatica, right side: Secondary | ICD-10-CM | POA: Diagnosis not present

## 2020-01-06 DIAGNOSIS — G8929 Other chronic pain: Secondary | ICD-10-CM

## 2020-01-06 NOTE — Progress Notes (Signed)
Patient name: [name] MRN: [id number] DOB: 1956-11-14 Sex: female  REASON FOR VISIT: Evaluate for L5-S1 ALIF  HPI: [name] is a 64 y.o. female with history of hypertension, coronary artery disease, COPD that presents for evaluation of possible L5-S1 ALIF.  She states she has had chronic lower back pain following work injury in 2016.  Ultimately she previous underwent L3-L5 posterior fusion with Dr. Rolena Infante.  She states her main issue now is lower back pain with radiculopathy down both legs.  Pain is bothersome at all times.  Dr. Rolena Infante asked that vascular surgery evaluate her for possible L5-S1 ALIF.  Previous abdominal surgery includes cholecystectomy via right upper quadrant open incision as well as a tubal ligation that was through her bellybutton.  She states that she was smoking up until today but plans to quit after today.  Otherwise, states she has been very depressed since her daughter and grandchild moved to [place].   Past Medical History:  Diagnosis Date  . Agoraphobia without history of panic disorder   . Anginal pain ([place])   . Anxiety   . Arthritis    "qwhere" (04/03/2018)  . Childhood asthma   . Chronic bronchitis ([place])   . Chronic lower back pain   . COPD (chronic obstructive pulmonary disease) ([place])   . Coronary artery disease CARDIOLOGIST- DR  Doylene Canard  (VISIT 03-30-11 W/ CHART)   NON-OBS. CAD   (STRESS TEST NOV. 2011  . Daily headache   . DDD (degenerative disc disease)   . Depression   . DJD (degenerative joint disease)    JOINT PAIN  . Dyspnea    "anytime but mostly on exertion and smoking"  . Fibromyalgia   . Frequency of urination   . GERD (gastroesophageal reflux disease) AND HIATIAL HERNIA   CONTROLLED W/ NEXIUM  . Heart murmur   . Hemorrhoids   . High cholesterol   . History of blood transfusion    "related to anemia" (04/03/2018)  . History of gastric ulcer 2004  . History of hiatal hernia   . Hypertension   . IBS (irritable bowel syndrome)     . Incisional pain s/p interstim implant 1st stage--- 12-07-11    left upper buttock-- pt states dressing clean dry and intact (on 12-08-11)  . Insomnia   . Iron deficiency anemia   . Nocturia   . Non-productive cough   . Numbness in both hands AT TIMES  . OSA (obstructive sleep apnea)    "suppose to wear mask; I don't" (04/03/2018)  . Urge urinary incontinence     Past Surgical History:  Procedure Laterality Date  . ANTERIOR CERVICAL DECOMP/DISCECTOMY FUSION  2008   C4 - T1 ("screws from 1st OR came out")  . ANTERIOR CERVICAL DECOMP/DISCECTOMY FUSION  2002   C4 - 7  . ANTERIOR LATERAL LUMBAR FUSION WITH PERCUTANEOUS SCREW 2 LEVEL Left 11/27/2018   Procedure: XLIF Lumbar 3-5, posterior spinal fusion L3-5;  Surgeon: Melina Schools, MD;  Location: [facility];  Service: Orthopedics;  Laterality: Left;  5.5 hrs for entire procedure  . APPENDECTOMY  1982  . BACK SURGERY    . CARDIAC CATHETERIZATION  2003  . CARDIAC CATHETERIZATION N/A 08/23/2016   Procedure: Left Heart Cath and Coronary Angiography;  Surgeon: Dixie Dials, MD;  Location: [facility];  Service: Cardiovascular;  Laterality: N/A;  . CARPAL TUNNEL RELEASE Right   . CATARACT EXTRACTION W/ INTRAOCULAR LENS  IMPLANT, BILATERAL  2016-2018  . CHOLECYSTECTOMY OPEN  1982  . CYSTO/ HOD/ BLADDER BX  2006  . CYSTO/ HOD/ BLADDER BX/ FULGERATION  06-12-2011  . FINGER SURGERY Right 2001   "replaced worn cartilage on thumb w/tendons"  . HYSTEROSCOPY WITH D & C  2005  . INTERSTIM IMPLANT PLACEMENT  12/07/2011   Procedure: Barrie Lyme IMPLANT FIRST STAGE;  Surgeon: [name], MD;  Location: [facility];  Service: Urology;  Laterality: Right;  . INTERSTIM IMPLANT PLACEMENT  12/14/2011   Procedure: Barrie Lyme IMPLANT SECOND STAGE;  Surgeon: [name], MD;  Location: [facility];  Service: Urology;  Laterality: Right;  rad tech ok by vickie at [facility]  . LUMBAR LAMINECTOMY  2007   L3 - 5  .  TONSILLECTOMY AND ADENOIDECTOMY  1965  . TUBAL LIGATION    . WRIST SURGERY Left    TFCC ("tendon repair")    Family History  Problem Relation Age of Onset  . Hypertension Father   . Heart disease Father   . Kidney failure Father   . Breast cancer Mother   . Hypertension Mother   . Coronary artery disease Mother   . Alzheimer's disease Mother   . Hypertension Brother   . Hypertension Sister   . Hypertension Daughter     SOCIAL HISTORY: Social History   Tobacco Use  . Smoking status: Current Every Day Smoker    Packs/day: 2.00    Years: 43.00    Pack years: 86.00  . Smokeless tobacco: Never Used  Substance Use Topics  . Alcohol use: Not Currently    Alcohol/week: 0.0 standard drinks    Comment: 04/03/2018 "nothing in years"    Allergies  Allergen Reactions  . Midazolam Hcl Other (See Comments)    HYPER  . Prednisone Other (See Comments)    HYPER, depressed, moody  . Statins Other (See Comments)    Causes muscle weakness and joint pain  . Ace Inhibitors Other (See Comments)    DECREASES HEARTRATE  . Amoxicillin Diarrhea  . Nsaids Other (See Comments)    AVOIDS; HX GASTRIC ULCER  . Sulfa Antibiotics Rash and Other (See Comments)    JOINT PAIN    Current Outpatient Medications  Medication Sig Dispense Refill  . acetaminophen (TYLENOL) 650 MG CR tablet Take 1,300 mg by mouth every 8 (eight) hours as needed for pain.    Marland Kitchen albuterol (PROVENTIL HFA;VENTOLIN HFA) 108 (90 BASE) MCG/ACT inhaler Inhale 2 puffs into the lungs every 6 (six) hours as needed for wheezing or shortness of breath.    Marland Kitchen buPROPion (WELLBUTRIN XL) 300 MG 24 hr tablet Take 300 mg by mouth daily.    . cholecalciferol (VITAMIN D) 1000 UNITS tablet Take 1,000 Units by mouth daily.     . clonazePAM (KLONOPIN) 1 MG tablet Take 1 tablet (1 mg total) by mouth at bedtime. 30 tablet 5  . diltiazem (CARDIZEM CD) 120 MG 24 hr capsule Take 1 capsule (120 mg total) by mouth daily. 90 capsule 2  . DULoxetine  (CYMBALTA) 60 MG capsule Take 60 mg by mouth at bedtime.     . gabapentin (NEURONTIN) 100 MG capsule Take 1 capsule (100 mg total) by mouth at bedtime. 30 capsule 0  . hydrALAZINE (APRESOLINE) 25 MG tablet Take 25 mg by mouth 3 (three) times daily.     . hydrochlorothiazide (HYDRODIURIL) 25 MG tablet TAKE 1 TABLET BY MOUTH EVERY DAY (Patient taking differently: Take 25 mg by mouth daily. ) 90 tablet 1  . metoprolol tartrate (  LOPRESSOR) 25 MG tablet Take 0.5 tablets (12.5 mg total) by mouth 2 (two) times daily. 30 tablet 3  . nitroGLYCERIN (NITROSTAT) 0.4 MG SL tablet Place 1 tablet (0.4 mg total) under the tongue every 5 (five) minutes x 3 doses as needed for chest pain. 10 tablet 0  . pantoprazole (PROTONIX) 40 MG tablet Take 1 tablet (40 mg total) by mouth daily as needed. (Patient taking differently: Take 40 mg by mouth daily as needed. Heart Burn) 20 tablet 0  . SUPER B COMPLEX/C PO Take 1 tablet by mouth daily.     Marland Kitchen tiZANidine (ZANAFLEX) 4 MG tablet Take 4 mg by mouth every 6 (six) hours as needed for muscle spasms.    Marland Kitchen atorvastatin (LIPITOR) 20 MG tablet Take 1 tablet (20 mg total) by mouth daily. (Patient not taking: Reported on 01/06/2020) 30 tablet 9  . fenofibrate (TRICOR) 48 MG tablet Take 48 mg by mouth daily.   6  . ferrous sulfate 325 (65 FE) MG tablet Take 325 mg by mouth daily with breakfast.    . Magnesium 500 MG CAPS Take 500 mg by mouth daily.     . methocarbamol (ROBAXIN) 500 MG tablet Take 1 tablet (500 mg total) by mouth every 8 (eight) hours as needed for muscle spasms. (Patient not taking: Reported on 01/06/2020) 15 tablet 0  . montelukast (SINGULAIR) 10 MG tablet Take 10 mg by mouth at bedtime.    . ondansetron (ZOFRAN) 4 MG tablet Take 1 tablet (4 mg total) by mouth every 8 (eight) hours as needed for nausea or vomiting. (Patient not taking: Reported on 01/06/2020) 20 tablet 0   No current facility-administered medications for this visit.    REVIEW OF SYSTEMS:  [X]   denotes positive finding, [ ]  denotes negative finding Cardiac  Comments:  Chest pain or chest pressure: x   Shortness of breath upon exertion: x   Short of breath when lying flat:    Irregular heart rhythm: x       Vascular    Pain in calf, thigh, or hip brought on by ambulation: x   Pain in feet at night that wakes you up from your sleep:  x   Blood clot in your veins:    Leg swelling:  x       Pulmonary    Oxygen at home:    Productive cough:     Wheezing:         Neurologic    Sudden weakness in arms or legs:     Sudden numbness in arms or legs:     Sudden onset of difficulty speaking or slurred speech:    Temporary loss of vision in one eye:     Problems with dizziness:         Gastrointestinal    Blood in stool:     Vomited blood:         Genitourinary    Burning when urinating:     Blood in urine:        Psychiatric    Major depression:         Hematologic    Bleeding problems:    Problems with blood clotting too easily:        Skin    Rashes or ulcers:        Constitutional    Fever or chills:      PHYSICAL EXAM: Vitals:   01/06/20 0933  BP: 117/67  Pulse: 60  Resp: 16  Temp: (!) 97.2 F (36.2 C)  TempSrc: Temporal  SpO2: 99%  Weight: 166 lb (75.3 kg)  Height: 4\' 11"  (1.499 m)    GENERAL: The patient is a well-nourished female, in no acute distress. The vital signs are documented above. CARDIAC: There is a regular rate and rhythm.  VASCULAR:  Palpable femoral pulses bilaterally Palpable DP pulses bilaterally PULMONARY: There is good air exchange bilaterally without wheezing or rales. ABDOMEN: Soft and non-tender.  Right upper quadrant incision from previous chole.  Old umbilical scar from tubal ligation. MUSCULOSKELETAL: There are no major deformities or cyanosis. NEUROLOGIC: No focal weakness or paresthesias are detected. SKIN: There are no ulcers or rashes noted. PSYCHIATRIC: The patient has a normal affect.  DATA:   I independently  reviewed her CT L-spine from 12/05/2019: On my review she has a fairly calcified distal aorta as well as iliacs.  Her aortic bifurcation is at the level of L4 and the vein bifurcation is at the level of L5.  Posterior hardware at L3-L5.  Assessment/Plan:  64 year old female presents for evaluation of possible L5-S1 ALIF.  I have reviewed her CT scan and after examination of her abdomen, I think she would be a reasonable candidate for anterior approach if Dr. Rolena Infante feels that this is the best option for her back surgery.  She is obese and we did talk about that that certainly makes exposure somewhat more difficult but after review of her CT imaging and previous surgical history, I do think an anterior approach would be reasonable at L5-S1.  Certainly if we needed expose at higher levels (I.e. L4-L5) may be more difficult given the calcification of aorta/iliacs.  Discussed steps of surgery including incision over the left rectus with mobilization of the left rectus as well as retroperitoneal exposure of the L5-S1 disc base anteriorly by mobilizing the peritoneum and intestines and left ureter as well as the left iliac vein and possible artery.  We discussed possible injury to all the above structures.  I look forward to helping Dr. Rolena Infante if he feels this is the best way to proceed.   Marty Heck, MD [facility] Office: [phone]

## 2020-01-07 DIAGNOSIS — M170 Bilateral primary osteoarthritis of knee: Secondary | ICD-10-CM | POA: Diagnosis not present

## 2020-01-07 DIAGNOSIS — M25561 Pain in right knee: Secondary | ICD-10-CM | POA: Diagnosis not present

## 2020-01-13 DIAGNOSIS — M5136 Other intervertebral disc degeneration, lumbar region: Secondary | ICD-10-CM | POA: Diagnosis not present

## 2020-01-16 DIAGNOSIS — M1711 Unilateral primary osteoarthritis, right knee: Secondary | ICD-10-CM | POA: Diagnosis not present

## 2020-01-16 DIAGNOSIS — M1712 Unilateral primary osteoarthritis, left knee: Secondary | ICD-10-CM | POA: Diagnosis not present

## 2020-01-16 DIAGNOSIS — M25561 Pain in right knee: Secondary | ICD-10-CM | POA: Diagnosis not present

## 2020-01-16 DIAGNOSIS — M25562 Pain in left knee: Secondary | ICD-10-CM | POA: Diagnosis not present

## 2020-01-16 DIAGNOSIS — M170 Bilateral primary osteoarthritis of knee: Secondary | ICD-10-CM | POA: Diagnosis not present

## 2020-01-20 DIAGNOSIS — M129 Arthropathy, unspecified: Secondary | ICD-10-CM | POA: Diagnosis not present

## 2020-01-20 DIAGNOSIS — Z1339 Encounter for screening examination for other mental health and behavioral disorders: Secondary | ICD-10-CM | POA: Diagnosis not present

## 2020-01-20 DIAGNOSIS — R5383 Other fatigue: Secondary | ICD-10-CM | POA: Diagnosis not present

## 2020-01-20 DIAGNOSIS — Z1331 Encounter for screening for depression: Secondary | ICD-10-CM | POA: Diagnosis not present

## 2020-01-20 DIAGNOSIS — Z79899 Other long term (current) drug therapy: Secondary | ICD-10-CM | POA: Diagnosis not present

## 2020-01-20 DIAGNOSIS — Z1159 Encounter for screening for other viral diseases: Secondary | ICD-10-CM | POA: Diagnosis not present

## 2020-01-20 DIAGNOSIS — E559 Vitamin D deficiency, unspecified: Secondary | ICD-10-CM | POA: Diagnosis not present

## 2020-01-27 DIAGNOSIS — M545 Low back pain: Secondary | ICD-10-CM | POA: Diagnosis not present

## 2020-01-27 DIAGNOSIS — I10 Essential (primary) hypertension: Secondary | ICD-10-CM | POA: Diagnosis not present

## 2020-01-27 DIAGNOSIS — R0602 Shortness of breath: Secondary | ICD-10-CM | POA: Diagnosis not present

## 2020-01-27 DIAGNOSIS — R072 Precordial pain: Secondary | ICD-10-CM | POA: Diagnosis not present

## 2020-01-30 NOTE — Patient Instructions (Addendum)
DUE TO COVID-19 ONLY ONE VISITOR IS ALLOWED TO COME WITH YOU AND STAY IN THE WAITING ROOM ONLY DURING PRE OP AND PROCEDURE DAY OF SURGERY. THE 1 VISITOR MAY VISIT WITH YOU AFTER SURGERY IN YOUR PRIVATE ROOM DURING VISITING HOURS ONLY!  YOU NEED TO HAVE A COVID 19 TEST ON__2/19/2021_____ @___2 :50PM____, THIS TEST MUST BE DONE BEFORE SURGERY, COME  [name] , [id number].  ([place]) ONCE YOUR COVID TEST IS COMPLETED, PLEASE BEGIN THE QUARANTINE INSTRUCTIONS AS OUTLINED IN YOUR HANDOUT.                [name]    Your procedure is scheduled on: 02/10/2020   Report to [facility] Main  Entrance    Report to admitting at 5:30AM    Call this number if you have problems the morning of surgery [phone]    Remember: NO SOLID FOOD AFTER MIDNIGHT THE NIGHT PRIOR TO SURGERY. YOU MAY DRINK CLEAR LIQUIDS.   STOP CLEAR LIQUIDS AT ___4:15AM______ AND THEN DRINK THE ENSURE PRE-SURGERY [place]. NOTHING BY MOUTH AFTER THE ENSURE DRINK!    CLEAR LIQUID DIET   Foods Allowed                                                                     Foods Excluded  Coffee and tea, regular and decaf                             liquids that you cannot  Plain Jell-O any favor except red or purple                                           see through such as: Fruit ices (not with fruit pulp)                                     milk, soups, orange juice  Iced Popsicles                                    All solid food Carbonated beverages, regular and diet                                    Cranberry, grape and apple juices Sports drinks like Gatorade Lightly seasoned clear broth or consume(fat free) Sugar, honey syrup  Sample Menu Breakfast                                Lunch                                     Supper Cranberry juice  Beef broth                            Chicken broth Jell-O                                     Grape juice                            Apple juice Coffee or tea                        Jell-O                                      Popsicle                                                Coffee or tea                        Coffee or tea  _____________________________________________________________________  BRUSH YOUR TEETH MORNING OF SURGERY AND RINSE YOUR MOUTH OUT, NO CHEWING GUM CANDY OR MINTS.     Take these medicines the morning of surgery with A SIP OF WATER: Wellbutrin, Buspar, Cardizem, metoprolol, Protonix if needed, albuterol inhaler If needed                                 You may not have any metal on your body including hair pins and              piercings  Do not wear jewelry, make-up, lotions, powders or perfumes, deodorant             Do not wear nail polish on your fingernails.  Do not shave  48 hours prior to surgery.     Do not bring valuables to the hospital. [place].  Contacts, dentures or bridgework may not be worn into surgery.      Patients discharged the day of surgery will not be allowed to drive home. IF YOU ARE HAVING SURGERY AND GOING HOME THE SAME DAY, YOU MUST HAVE AN ADULT TO DRIVE YOU HOME AND BE WITH YOU FOR 24 HOURS. YOU MAY GO HOME BY TAXI OR UBER OR ORTHERWISE, BUT AN ADULT MUST ACCOMPANY YOU HOME AND STAY WITH YOU FOR 24 HOURS.  Name and phone number of your driver:  Special Instructions: N/A              Please read over the following fact sheets you were given: _____________________________________________________________________             [facility] - Preparing for Surgery Before surgery, you can play an important role.  Because skin is not sterile, your skin needs to be as free of germs as possible.  You can reduce the number of germs on your skin by washing with CHG (chlorahexidine gluconate) soap before surgery.  CHG  is an antiseptic cleaner which kills germs and bonds with the skin to continue  killing germs even after washing. Please DO NOT use if you have an allergy to CHG or antibacterial soaps.  If your skin becomes reddened/irritated stop using the CHG and inform your nurse when you arrive at Short Stay. Do not shave (including legs and underarms) for at least 48 hours prior to the first CHG shower.  You may shave your face/neck. Please follow these instructions carefully:  1.  Shower with CHG Soap the night before surgery and the  morning of Surgery.  2.  If you choose to wash your hair, wash your hair first as usual with your  normal  shampoo.  3.  After you shampoo, rinse your hair and body thoroughly to remove the  shampoo.                           4.  Use CHG as you would any other liquid soap.  You can apply chg directly  to the skin and wash                       Gently with a scrungie or clean washcloth.  5.  Apply the CHG Soap to your body ONLY FROM THE NECK DOWN.   Do not use on face/ open                           Wound or open sores. Avoid contact with eyes, ears mouth and genitals (private parts).                       Wash face,  Genitals (private parts) with your normal soap.             6.  Wash thoroughly, paying special attention to the area where your surgery  will be performed.  7.  Thoroughly rinse your body with warm water from the neck down.  8.  DO NOT shower/wash with your normal soap after using and rinsing off  the CHG Soap.                9.  Pat yourself dry with a clean towel.            10.  Wear clean pajamas.            11.  Place clean sheets on your bed the night of your first shower and do not  sleep with pets. Day of Surgery : Do not apply any lotions/deodorants the morning of surgery.  Please wear clean clothes to the hospital/surgery center.  FAILURE TO FOLLOW THESE INSTRUCTIONS MAY RESULT IN THE CANCELLATION OF YOUR SURGERY PATIENT SIGNATURE_________________________________  NURSE  SIGNATURE__________________________________  ________________________________________________________________________   [name]  An incentive spirometer is a tool that can help keep your lungs clear and active. This tool measures how well you are filling your lungs with each breath. Taking long deep breaths may help reverse or decrease the chance of developing breathing (pulmonary) problems (especially infection) following:  A long period of time when you are unable to move or be active. BEFORE THE PROCEDURE   If the spirometer includes an indicator to show your best effort, your nurse or respiratory therapist will set it to a desired goal.  If possible, sit up straight or lean slightly forward. Try not to slouch.  Hold the incentive spirometer in an upright position. INSTRUCTIONS FOR USE  1. Sit on the edge of your bed if possible, or sit up as far as you can in bed or on a chair. 2. Hold the incentive spirometer in an upright position. 3. Breathe out normally. 4. Place the mouthpiece in your mouth and seal your lips tightly around it. 5. Breathe in slowly and as deeply as possible, raising the piston or the ball toward the top of the column. 6. Hold your breath for 3-5 seconds or for as long as possible. Allow the piston or ball to fall to the bottom of the column. 7. Remove the mouthpiece from your mouth and breathe out normally. 8. Rest for a few seconds and repeat Steps 1 through 7 at least 10 times every 1-2 hours when you are awake. Take your time and take a few normal breaths between deep breaths. 9. The spirometer may include an indicator to show your best effort. Use the indicator as a goal to work toward during each repetition. 10. After each set of 10 deep breaths, practice coughing to be sure your lungs are clear. If you have an incision (the cut made at the time of surgery), support your incision when coughing by placing a pillow or rolled up towels firmly  against it. Once you are able to get out of bed, walk around indoors and cough well. You may stop using the incentive spirometer when instructed by your caregiver.  RISKS AND COMPLICATIONS  Take your time so you do not get dizzy or light-headed.  If you are in pain, you may need to take or ask for pain medication before doing incentive spirometry. It is harder to take a deep breath if you are having pain. AFTER USE  Rest and breathe slowly and easily.  It can be helpful to keep track of a log of your progress. Your caregiver can provide you with a simple table to help with this. If you are using the spirometer at home, follow these instructions: [place] IF:   You are having difficultly using the spirometer.  You have trouble using the spirometer as often as instructed.  Your pain medication is not giving enough relief while using the spirometer.  You develop fever of 100.5 F (38.1 C) or higher. SEEK IMMEDIATE MEDICAL CARE IF:   You cough up bloody sputum that had not been present before.  You develop fever of 102 F (38.9 C) or greater.  You develop worsening pain at or near the incision site. MAKE SURE YOU:   Understand these instructions.  Will watch your condition.  Will get help right away if you are not doing well or get worse. Document Released: 04/16/2007 Document Revised: 02/26/2012 Document Reviewed: 06/17/2007 ExitCare Patient Information 2014 ExitCare, [place].   ________________________________________________________________________  WHAT IS A BLOOD TRANSFUSION? Blood Transfusion Information  A transfusion is the replacement of blood or some of its parts. Blood is made up of multiple cells which provide different functions.  Red blood cells carry oxygen and are used for blood loss replacement.  White blood cells fight against infection.  Platelets control bleeding.  Plasma helps clot blood.  Other blood products are available for  specialized needs, such as hemophilia or other clotting disorders. BEFORE THE TRANSFUSION  Who gives blood for transfusions?   Healthy volunteers who are fully evaluated to make sure their blood is safe. This is blood bank blood. Transfusion therapy is the safest it has ever  been in the practice of medicine. Before blood is taken from a donor, a complete history is taken to make sure that person has no history of diseases nor engages in risky social behavior (examples are intravenous drug use or sexual activity with multiple partners). The donor's travel history is screened to minimize risk of transmitting infections, such as malaria. The donated blood is tested for signs of infectious diseases, such as HIV and hepatitis. The blood is then tested to be sure it is compatible with you in order to minimize the chance of a transfusion reaction. If you or a relative donates blood, this is often done in anticipation of surgery and is not appropriate for emergency situations. It takes many days to process the donated blood. RISKS AND COMPLICATIONS Although transfusion therapy is very safe and saves many lives, the main dangers of transfusion include:   Getting an infectious disease.  Developing a transfusion reaction. This is an allergic reaction to something in the blood you were given. Every precaution is taken to prevent this. The decision to have a blood transfusion has been considered carefully by your caregiver before blood is given. Blood is not given unless the benefits outweigh the risks. AFTER THE TRANSFUSION  Right after receiving a blood transfusion, you will usually feel much better and more energetic. This is especially true if your red blood cells have gotten low (anemic). The transfusion raises the level of the red blood cells which carry oxygen, and this usually causes an energy increase.  The nurse administering the transfusion will monitor you carefully for complications. HOME CARE  INSTRUCTIONS  No special instructions are needed after a transfusion. You may find your energy is better. Speak with your caregiver about any limitations on activity for underlying diseases you may have. SEEK MEDICAL CARE IF:   Your condition is not improving after your transfusion.  You develop redness or irritation at the intravenous (IV) site. SEEK IMMEDIATE MEDICAL CARE IF:  Any of the following symptoms occur over the next 12 hours:  Shaking chills.  You have a temperature by mouth above 102 F (38.9 C), not controlled by medicine.  Chest, back, or muscle pain.  People around you feel you are not acting correctly or are confused.  Shortness of breath or difficulty breathing.  Dizziness and fainting.  You get a rash or develop hives.  You have a decrease in urine output.  Your urine turns a dark color or changes to pink, red, or brown. Any of the following symptoms occur over the next 10 days:  You have a temperature by mouth above 102 F (38.9 C), not controlled by medicine.  Shortness of breath.  Weakness after normal activity.  The white part of the eye turns yellow (jaundice).  You have a decrease in the amount of urine or are urinating less often.  Your urine turns a dark color or changes to pink, red, or brown. Document Released: 12/01/2000 Document Revised: 02/26/2012 Document Reviewed: 07/20/2008 [facility] Patient Information 2014 [place], [place].  _______________________________________________________________________

## 2020-02-02 ENCOUNTER — Encounter (HOSPITAL_COMMUNITY)
Admission: RE | Admit: 2020-02-02 | Discharge: 2020-02-02 | Disposition: A | Discharge status: Home / Self Care | Payer: Medicare Other | Source: Ambulatory Visit | Attending: Orthopedic Surgery | Admitting: Orthopedic Surgery

## 2020-02-02 ENCOUNTER — Other Ambulatory Visit: Payer: Self-pay

## 2020-02-02 ENCOUNTER — Encounter (HOSPITAL_COMMUNITY): Payer: Self-pay

## 2020-02-02 DIAGNOSIS — I2510 Atherosclerotic heart disease of native coronary artery without angina pectoris: Secondary | ICD-10-CM | POA: Diagnosis not present

## 2020-02-02 DIAGNOSIS — I10 Essential (primary) hypertension: Secondary | ICD-10-CM | POA: Diagnosis not present

## 2020-02-02 DIAGNOSIS — Z79899 Other long term (current) drug therapy: Secondary | ICD-10-CM | POA: Insufficient documentation

## 2020-02-02 DIAGNOSIS — G4733 Obstructive sleep apnea (adult) (pediatric): Secondary | ICD-10-CM | POA: Diagnosis not present

## 2020-02-02 DIAGNOSIS — F17210 Nicotine dependence, cigarettes, uncomplicated: Secondary | ICD-10-CM | POA: Diagnosis not present

## 2020-02-02 DIAGNOSIS — Z01812 Encounter for preprocedural laboratory examination: Secondary | ICD-10-CM | POA: Insufficient documentation

## 2020-02-02 DIAGNOSIS — J449 Chronic obstructive pulmonary disease, unspecified: Secondary | ICD-10-CM | POA: Diagnosis not present

## 2020-02-02 HISTORY — DX: Elevated white blood cell count, unspecified: D72.829

## 2020-02-02 HISTORY — DX: Supraventricular tachycardia, unspecified: I47.10

## 2020-02-02 HISTORY — DX: Abnormal levels of other serum enzymes: R74.8

## 2020-02-02 HISTORY — DX: Other specified postprocedural states: R11.2

## 2020-02-02 HISTORY — DX: Other specified postprocedural states: Z98.890

## 2020-02-02 HISTORY — DX: Personal history of other diseases of the nervous system and sense organs: Z86.69

## 2020-02-02 HISTORY — DX: Supraventricular tachycardia: I47.1

## 2020-02-02 LAB — BASIC METABOLIC PANEL
Anion gap: 12 (ref 5–15)
BUN: 19 mg/dL (ref 8–23)
CO2: 30 mmol/L (ref 22–32)
Calcium: 10.1 mg/dL (ref 8.9–10.3)
Chloride: 97 mmol/L — ABNORMAL LOW (ref 98–111)
Creatinine, Ser: 1.1 mg/dL — ABNORMAL HIGH (ref 0.44–1.00)
GFR calc Af Amer: >60 mL/min (ref >60)
GFR calc non Af Amer: 52 mL/min — ABNORMAL LOW (ref >60)
Glucose, Bld: 103 mg/dL — ABNORMAL HIGH (ref 70–99)
Potassium: 4.7 mmol/L (ref 3.5–5.1)
Sodium: 139 mmol/L (ref 135–145)

## 2020-02-02 LAB — CBC
HCT: 46.2 % — ABNORMAL HIGH (ref 36.0–46.0)
Hemoglobin: 14.9 g/dL (ref 12.0–15.0)
MCH: 30 pg (ref 26.0–34.0)
MCHC: 32.3 g/dL (ref 30.0–36.0)
MCV: 93 fL (ref 80.0–100.0)
Platelets: 427 10*3/uL — ABNORMAL HIGH (ref 150–400)
RBC: 4.97 MIL/uL (ref 3.87–5.11)
RDW: 13.9 % (ref 11.5–15.5)
WBC: 19.6 10*3/uL — ABNORMAL HIGH (ref 4.0–10.5)
nRBC: 0 % (ref 0.0–0.2)

## 2020-02-02 LAB — SURGICAL PCR SCREEN
MRSA, PCR: NEGATIVE
Staphylococcus aureus: NEGATIVE

## 2020-02-02 NOTE — Progress Notes (Signed)
PCP - Dr. Beckie Salts Cardiologist - Dr. Maeola Sarah  Chest x-ray - 08/02/2019 EKG - 08/04/2019 Stress Test - 07/04/2019 ECHO - 02/05/2020 Cardiac Cath - greater than 2 years ago  Sleep Study - 06/2014 CPAP - Does not wear  Fasting Blood Sugar - N/A Checks Blood Sugar __N/A___ times a day  Blood Thinner Instructions: N/A Aspirin Instructions:N/A Last Dose:N/A  Anesthesia review: CAD, SVT, HTN, COPD, OSA  Patient denies shortness of breath, fever, cough and chest pain at PAT appointment   Patient verbalized understanding of instructions that were given to them at the PAT appointment. Patient was also instructed that they will need to review over the PAT instructions again at home before surgery.

## 2020-02-03 DIAGNOSIS — F17210 Nicotine dependence, cigarettes, uncomplicated: Secondary | ICD-10-CM | POA: Diagnosis not present

## 2020-02-03 DIAGNOSIS — M5030 Other cervical disc degeneration, unspecified cervical region: Secondary | ICD-10-CM | POA: Diagnosis not present

## 2020-02-03 DIAGNOSIS — M19011 Primary osteoarthritis, right shoulder: Secondary | ICD-10-CM | POA: Diagnosis not present

## 2020-02-03 DIAGNOSIS — Z79899 Other long term (current) drug therapy: Secondary | ICD-10-CM | POA: Diagnosis not present

## 2020-02-03 DIAGNOSIS — M5136 Other intervertebral disc degeneration, lumbar region: Secondary | ICD-10-CM | POA: Diagnosis not present

## 2020-02-03 DIAGNOSIS — M19012 Primary osteoarthritis, left shoulder: Secondary | ICD-10-CM | POA: Diagnosis not present

## 2020-02-03 NOTE — Progress Notes (Signed)
Anesthesia Chart Review   Case: [id number] Date/Time: 02/10/20 [id number]   Procedure: TOTAL KNEE ARTHROPLASTY (Right Knee) - 70 mins   Anesthesia type: Spinal   Pre-op diagnosis: Right knee osteoarthritis   Location: [facility] ROOM 09 / [facility]   Surgeons: Paralee Cancel, MD      DISCUSSION:64 y.o. current every day smoker (65 pack years) with h/o PONV, HTN, CAD, h/o SVT, GERD, COPD, OSA, right knee OA scheduled for above procedure 02/10/20 with Dr. Paralee Cancel.   Admission 7/16-7/17/2020 due to chest pain and palpitations. Her EKG showed minimal ST elevations in anterior leads and her troponin I levels were minimally elevated.  Myocardial perfusion stress test was negative for reversible ischemia and had some diaphragmatic attenuation. EF was mildly decreased (EF 43%).  No recurrence of sx. Advised to follow up with cardiology in 1 week.   Clearance received from cardiologist, [name], 02/04/20 which states pt is low risk for surgical procedure, "may start low dose aspirin after surgery and anticoagulant as needed."  Anticipate pt can proceed with planned procedure barring acute status change.   VS: BP (!) 157/72 (BP Location: Right Arm)   Pulse 61   Temp 36.8 C (Oral)   Resp 18   Ht 4\' 11"  (1.499 m)   Wt 76.3 kg   LMP 09/06/2005   SpO2 98%   BMI 33.98 kg/m   PROVIDERS: Maurice Small, MD is PCP   Dixie Dials, MD is Cardiologist  LABS: Labs reviewed: Acceptable for surgery. (all labs ordered are listed, but only abnormal results are displayed)  Labs Reviewed  TYPE AND SCREEN     IMAGES:   EKG: 08/04/2019 Rate 55 bpm  Sinus rhythm  Anteroseptal infarct  CV: Myocardial 07/04/2019 IMPRESSION: 1. No reversible ischemia. Stable anteroapical and apicoseptal defects. 2. Marked septal hypokinesis. 3. Left ventricular ejection fraction 43  % 4. Non invasive risk stratification*: Intermediate  Echo 08/24/2016 Study Conclusions   - Left ventricle: The cavity size was normal. There  was mild  concentric hypertrophy. Systolic function was normal. The  estimated ejection fraction was in the range of 60% to 65%. Wall  motion was normal; there were no regional wall motion  abnormalities. Doppler parameters are consistent with abnormal  left ventricular relaxation (grade 1 diastolic dysfunction).  - Mitral valve: Calcified annulus.   Cardiac Cath 08/23/2016  Prox RCA lesion, 45 %stenosed.  Ost LAD to Prox LAD lesion, 25 %stenosed.  Mid LAD to Dist LAD lesion, 40 %stenosed.  Dist LAD lesion, 60 %stenosed.  Ost 1st Mrg lesion, 20 %stenosed.  The left ventricular systolic function is normal.  LV end diastolic pressure is normal.  Ost 2nd Mrg lesion, 90 %stenosed.   Life style modification. Medical treatment as OM 2 is relatively small size vessel. Past Medical History:  Diagnosis Date  . Agoraphobia without history of panic disorder   . Anginal pain ([place])   . Anxiety   . Arthritis    "qwhere" (04/03/2018)  . Childhood asthma   . Chronic bronchitis ([place])   . Chronic lower back pain   . COPD (chronic obstructive pulmonary disease) ([place])   . Coronary artery disease CARDIOLOGIST- DR  Doylene Canard  (VISIT 03-30-11 W/ CHART)   NON-OBS. CAD   (STRESS TEST NOV. 2011  . Daily headache   . DDD (degenerative disc disease)   . Depression   . DJD (degenerative joint disease)    JOINT PAIN  . Dyspnea    "anytime but mostly on exertion and  smoking"  . Elevated liver enzymes   . Fibromyalgia   . Frequency of urination   . GERD (gastroesophageal reflux disease) AND HIATIAL HERNIA   CONTROLLED W/ NEXIUM  . Heart murmur   . Hemorrhoids   . High cholesterol   . History of blood transfusion    "related to anemia" (04/03/2018)  . History of carpal tunnel syndrome    Right  . History of gastric ulcer 2004  . History of hiatal hernia   . Hypertension   . IBS (irritable bowel syndrome)   . Incisional pain s/p interstim implant 1st stage--- 12-07-11    left upper  buttock-- pt states dressing clean dry and intact (on 12-08-11)  . Insomnia   . Iron deficiency anemia   . Leukocytosis   . Nocturia   . Non-productive cough   . Numbness in both hands AT TIMES  . OSA (obstructive sleep apnea)    "suppose to wear mask; I don't" (04/03/2018)  . PONV (postoperative nausea and vomiting)   . SVT (supraventricular tachycardia) ([place])   . Urge urinary incontinence     Past Surgical History:  Procedure Laterality Date  . ANTERIOR CERVICAL DECOMP/DISCECTOMY FUSION  2008   C4 - T1 ("screws from 1st OR came out")  . ANTERIOR CERVICAL DECOMP/DISCECTOMY FUSION  2002   C4 - 7  . ANTERIOR LATERAL LUMBAR FUSION WITH PERCUTANEOUS SCREW 2 LEVEL Left 11/27/2018   Procedure: XLIF Lumbar 3-5, posterior spinal fusion L3-5;  Surgeon: Melina Schools, MD;  Location: [facility];  Service: Orthopedics;  Laterality: Left;  5.5 hrs for entire procedure  . APPENDECTOMY  1982  . BACK SURGERY    . CARDIAC CATHETERIZATION  2003  . CARDIAC CATHETERIZATION N/A 08/23/2016   Procedure: Left Heart Cath and Coronary Angiography;  Surgeon: Dixie Dials, MD;  Location: [place] [facility] [facility];  Service: Cardiovascular;  Laterality: N/A;  . CARPAL TUNNEL RELEASE Right   . CATARACT EXTRACTION W/ INTRAOCULAR LENS  IMPLANT, BILATERAL  2016-2018  . CHOLECYSTECTOMY OPEN  1982  . COLONOSCOPY    . CYSTO/ HOD/ BLADDER BX  2006  . CYSTO/ HOD/ BLADDER BX/ FULGERATION  06-12-2011  . FINGER SURGERY Right 2001   "replaced worn cartilage on thumb w/tendons"  . HYSTEROSCOPY WITH D & C  2005  . INTERSTIM IMPLANT PLACEMENT  12/07/2011   Procedure: Barrie Lyme IMPLANT FIRST STAGE;  Surgeon: [name], MD;  Location: [facility];  Service: Urology;  Laterality: Right;  . INTERSTIM IMPLANT PLACEMENT  12/14/2011   Procedure: Barrie Lyme IMPLANT SECOND STAGE;  Surgeon: [name], MD;  Location: [facility];  Service: Urology;  Laterality: Right;  rad tech ok by vickie at [facility]   . LUMBAR LAMINECTOMY  2007   L3 - 5  . TONSILLECTOMY AND ADENOIDECTOMY  1965  . TUBAL LIGATION    . UPPER GI ENDOSCOPY    . WRIST SURGERY Left    TFCC ("tendon repair")    MEDICATIONS: . acetaminophen (TYLENOL) 650 MG CR tablet  . albuterol (PROVENTIL HFA;VENTOLIN HFA) 108 (90 BASE) MCG/ACT inhaler  . atorvastatin (LIPITOR) 20 MG tablet  . buPROPion (WELLBUTRIN XL) 300 MG 24 hr tablet  . busPIRone (BUSPAR) 10 MG tablet  . clonazePAM (KLONOPIN) 1 MG tablet  . diltiazem (CARDIZEM CD) 120 MG 24 hr capsule  . DULoxetine (CYMBALTA) 60 MG capsule  . ferrous sulfate 325 (65 FE) MG tablet  . gabapentin (NEURONTIN) 100 MG capsule  . hydrALAZINE (APRESOLINE) 25 MG  tablet  . hydrochlorothiazide (HYDRODIURIL) 25 MG tablet  . metoprolol tartrate (LOPRESSOR) 25 MG tablet  . nitroGLYCERIN (NITROSTAT) 0.4 MG SL tablet  . oxyCODONE-acetaminophen (PERCOCET/ROXICET) 5-325 MG tablet  . pantoprazole (PROTONIX) 40 MG tablet  . tiZANidine (ZANAFLEX) 4 MG tablet  . Vitamin D, Ergocalciferol, (DRISDOL) 1.25 MG (50000 UNIT) CAPS capsule   No current facility-administered medications for this encounter.     Maia Plan [facility] Pre-Surgical Testing [phone] 02/06/20  4:49 PM

## 2020-02-04 DIAGNOSIS — I340 Nonrheumatic mitral (valve) insufficiency: Secondary | ICD-10-CM | POA: Diagnosis not present

## 2020-02-04 DIAGNOSIS — R072 Precordial pain: Secondary | ICD-10-CM | POA: Diagnosis not present

## 2020-02-04 DIAGNOSIS — I361 Nonrheumatic tricuspid (valve) insufficiency: Secondary | ICD-10-CM | POA: Diagnosis not present

## 2020-02-04 DIAGNOSIS — R0602 Shortness of breath: Secondary | ICD-10-CM | POA: Diagnosis not present

## 2020-02-06 ENCOUNTER — Other Ambulatory Visit (HOSPITAL_COMMUNITY)
Admission: RE | Admit: 2020-02-06 | Discharge: 2020-02-06 | Disposition: A | Discharge status: Home / Self Care | Payer: Medicare Other | Source: Ambulatory Visit | Attending: Orthopedic Surgery | Admitting: Orthopedic Surgery

## 2020-02-06 DIAGNOSIS — Z01812 Encounter for preprocedural laboratory examination: Secondary | ICD-10-CM | POA: Diagnosis not present

## 2020-02-06 DIAGNOSIS — Z20822 Contact with and (suspected) exposure to covid-19: Secondary | ICD-10-CM | POA: Insufficient documentation

## 2020-02-06 LAB — SARS CORONAVIRUS 2 (TAT 6-24 HRS): SARS Coronavirus 2: NEGATIVE

## 2020-02-06 NOTE — H&P (Signed)
TOTAL KNEE ADMISSION H&P  Patient is being admitted for right total knee arthroplasty.  Subjective:  Chief Complaint:right knee pain.  HPI: [name], 64 y.o. female, has a history of pain and functional disability in the right knee due to arthritis and has failed non-surgical conservative treatments for greater than 12 weeks to includecorticosteriod injections, viscosupplementation injections and activity modification.  Onset of symptoms was gradual, starting 3 years ago with gradually worsening course since that time. The patient noted no past surgery on the right knee(s).  Patient currently rates pain in the right knee(s) at 6 out of 10 with activity. Patient has worsening of pain with activity and weight bearing and pain that interferes with activities of daily living.  Patient has evidence of joint space narrowing by imaging studies. There is no active infection.  Patient Active Problem List   Diagnosis Date Noted  . Low back pain 11/27/2018  . Acute coronary syndrome ([place]) 04/02/2018  . Hyperlipidemia 08/06/2017  . Influenza A 01/01/2017  . COPD exacerbation ([place]) 01/01/2017  . Acute renal failure (ARF) ([place]) 01/01/2017  . Dehydration 01/01/2017  . Unstable angina ([place]) 10/14/2016  . Chest pain 08/22/2016  . SVT (supraventricular tachycardia) ([place]) 08/22/2016  . OSA on CPAP   . Hypertension   . GERD (gastroesophageal reflux disease)   . Depression   . Coronary artery disease   . Anxiety   . Pain in the chest   . Hypokalemia   . Carpal tunnel syndrome, bilateral 02/18/2016  . Leukocytosis 05/26/2014  . Tobacco abuse 05/26/2014  . Urge incontinence 12/14/2011   Past Medical History:  Diagnosis Date  . Agoraphobia without history of panic disorder   . Anginal pain ([place])   . Anxiety   . Arthritis    "qwhere" (04/03/2018)  . Childhood asthma   . Chronic bronchitis ([place])   . Chronic lower back pain   . COPD (chronic obstructive pulmonary disease) ([place])   . Coronary  artery disease CARDIOLOGIST- DR  Doylene Canard  (VISIT 03-30-11 W/ CHART)   NON-OBS. CAD   (STRESS TEST NOV. 2011  . Daily headache   . DDD (degenerative disc disease)   . Depression   . DJD (degenerative joint disease)    JOINT PAIN  . Dyspnea    "anytime but mostly on exertion and smoking"  . Elevated liver enzymes   . Fibromyalgia   . Frequency of urination   . GERD (gastroesophageal reflux disease) AND HIATIAL HERNIA   CONTROLLED W/ NEXIUM  . Heart murmur   . Hemorrhoids   . High cholesterol   . History of blood transfusion    "related to anemia" (04/03/2018)  . History of carpal tunnel syndrome    Right  . History of gastric ulcer 2004  . History of hiatal hernia   . Hypertension   . IBS (irritable bowel syndrome)   . Incisional pain s/p interstim implant 1st stage--- 12-07-11    left upper buttock-- pt states dressing clean dry and intact (on 12-08-11)  . Insomnia   . Iron deficiency anemia   . Leukocytosis   . Nocturia   . Non-productive cough   . Numbness in both hands AT TIMES  . OSA (obstructive sleep apnea)    "suppose to wear mask; I don't" (04/03/2018)  . PONV (postoperative nausea and vomiting)   . SVT (supraventricular tachycardia) ([place])   . Urge urinary incontinence     Past Surgical History:  Procedure Laterality Date  . ANTERIOR CERVICAL DECOMP/DISCECTOMY  FUSION  2008   C4 - T1 ("screws from 1st OR came out")  . ANTERIOR CERVICAL DECOMP/DISCECTOMY FUSION  2002   C4 - 7  . ANTERIOR LATERAL LUMBAR FUSION WITH PERCUTANEOUS SCREW 2 LEVEL Left 11/27/2018   Procedure: XLIF Lumbar 3-5, posterior spinal fusion L3-5;  Surgeon: Melina Schools, MD;  Location: [facility];  Service: Orthopedics;  Laterality: Left;  5.5 hrs for entire procedure  . APPENDECTOMY  1982  . BACK SURGERY    . CARDIAC CATHETERIZATION  2003  . CARDIAC CATHETERIZATION N/A 08/23/2016   Procedure: Left Heart Cath and Coronary Angiography;  Surgeon: Dixie Dials, MD;  Location: [facility];   Service: Cardiovascular;  Laterality: N/A;  . CARPAL TUNNEL RELEASE Right   . CATARACT EXTRACTION W/ INTRAOCULAR LENS  IMPLANT, BILATERAL  2016-2018  . CHOLECYSTECTOMY OPEN  1982  . COLONOSCOPY    . CYSTO/ HOD/ BLADDER BX  2006  . CYSTO/ HOD/ BLADDER BX/ FULGERATION  06-12-2011  . FINGER SURGERY Right 2001   "replaced worn cartilage on thumb w/tendons"  . HYSTEROSCOPY WITH D & C  2005  . INTERSTIM IMPLANT PLACEMENT  12/07/2011   Procedure: Barrie Lyme IMPLANT FIRST STAGE;  Surgeon: [name], MD;  Location: [facility];  Service: Urology;  Laterality: Right;  . INTERSTIM IMPLANT PLACEMENT  12/14/2011   Procedure: Barrie Lyme IMPLANT SECOND STAGE;  Surgeon: [name], MD;  Location: [facility];  Service: Urology;  Laterality: Right;  rad tech ok by vickie at [facility]  . LUMBAR LAMINECTOMY  2007   L3 - 5  . TONSILLECTOMY AND ADENOIDECTOMY  1965  . TUBAL LIGATION    . UPPER GI ENDOSCOPY    . WRIST SURGERY Left    TFCC ("tendon repair")    No current facility-administered medications for this encounter.   Current Outpatient Medications  Medication Sig Dispense Refill Last Dose  . acetaminophen (TYLENOL) 650 MG CR tablet Take 1,300 mg by mouth every 8 (eight) hours as needed for pain.     Marland Kitchen albuterol (PROVENTIL HFA;VENTOLIN HFA) 108 (90 BASE) MCG/ACT inhaler Inhale 2 puffs into the lungs every 6 (six) hours as needed for wheezing or shortness of breath.     Marland Kitchen buPROPion (WELLBUTRIN XL) 300 MG 24 hr tablet Take 300 mg by mouth daily.     . busPIRone (BUSPAR) 10 MG tablet Take 10 mg by mouth 2 (two) times daily.     . clonazePAM (KLONOPIN) 1 MG tablet Take 1 tablet (1 mg total) by mouth at bedtime. (Patient taking differently: Take 1 mg by mouth 2 (two) times daily. ) 30 tablet 5   . diltiazem (CARDIZEM CD) 120 MG 24 hr capsule Take 1 capsule (120 mg total) by mouth daily. 90 capsule 2   . DULoxetine (CYMBALTA) 60 MG capsule Take 60 mg by mouth at  bedtime.      . ferrous sulfate 325 (65 FE) MG tablet Take 325 mg by mouth daily with breakfast.     . gabapentin (NEURONTIN) 100 MG capsule Take 1 capsule (100 mg total) by mouth at bedtime. (Patient taking differently: Take 200 mg by mouth at bedtime. ) 30 capsule 0   . hydrALAZINE (APRESOLINE) 25 MG tablet Take 25 mg by mouth 2 (two) times daily.      . hydrochlorothiazide (HYDRODIURIL) 25 MG tablet TAKE 1 TABLET BY MOUTH EVERY DAY (Patient taking differently: Take 25 mg by mouth daily. ) 90 tablet 1   . metoprolol  tartrate (LOPRESSOR) 25 MG tablet Take 0.5 tablets (12.5 mg total) by mouth 2 (two) times daily. 30 tablet 3   . nitroGLYCERIN (NITROSTAT) 0.4 MG SL tablet Place 1 tablet (0.4 mg total) under the tongue every 5 (five) minutes x 3 doses as needed for chest pain. 10 tablet 0   . oxyCODONE-acetaminophen (PERCOCET/ROXICET) 5-325 MG tablet Take 1 tablet by mouth 2 (two) times daily as needed (pain.).      Marland Kitchen pantoprazole (PROTONIX) 40 MG tablet Take 1 tablet (40 mg total) by mouth daily as needed. (Patient taking differently: Take 40 mg by mouth daily as needed. Heart Burn) 20 tablet 0   . tiZANidine (ZANAFLEX) 4 MG tablet Take 4 mg by mouth 3 (three) times daily as needed for muscle spasms.      . Vitamin D, Ergocalciferol, (DRISDOL) 1.25 MG (50000 UNIT) CAPS capsule Take 50,000 Units by mouth every Wednesday.     Marland Kitchen atorvastatin (LIPITOR) 20 MG tablet Take 1 tablet (20 mg total) by mouth daily. (Patient not taking: Reported on 01/06/2020) 30 tablet 9    Allergies  Allergen Reactions  . Midazolam Hcl Other (See Comments)    HYPER  . Prednisone Other (See Comments)    HYPER, depressed, moody  . Statins Other (See Comments)    Causes muscle weakness and joint pain  . Ace Inhibitors Other (See Comments)    DECREASES HEARTRATE  . Amoxicillin Diarrhea    Did it involve swelling of the face/tongue/throat, SOB, or low BP? No Did it involve sudden or severe rash/hives, skin peeling, or any  reaction on the inside of your mouth or nose? No Did you need to seek medical attention at a hospital or doctor's office? No When did it last happen?~2019 or so If all above answers are "NO", may proceed with cephalosporin use.   . Nsaids Other (See Comments)    AVOIDS; HX GASTRIC ULCER  . Sulfa Antibiotics Rash and Other (See Comments)    JOINT PAIN    Social History   Tobacco Use  . Smoking status: Current Every Day Smoker    Packs/day: 2.00    Years: 43.00    Pack years: 86.00    Types: Cigarettes  . Smokeless tobacco: Never Used  Substance Use Topics  . Alcohol use: Not Currently    Alcohol/week: 0.0 standard drinks    Comment: 04/03/2018 "nothing in years"    Family History  Problem Relation Age of Onset  . Hypertension Father   . Heart disease Father   . Kidney failure Father   . Breast cancer Mother   . Hypertension Mother   . Coronary artery disease Mother   . Alzheimer's disease Mother   . Hypertension Brother   . Hypertension Sister   . Hypertension Daughter      Review of Systems  Constitutional: Negative for chills and fever.  Respiratory: Negative for cough and shortness of breath.   Cardiovascular: Negative for chest pain.  Gastrointestinal: Negative for nausea and vomiting.  Musculoskeletal: Positive for arthralgias.    Objective:  Physical Exam Patient is a 64 year old female.  Well nourished and well developed. General: Alert and oriented x3, cooperative and pleasant, no acute distress. Head: normocephalic, atraumatic, neck supple. Eyes: EOMI. Respiratory: breath sounds clear in all fields, no wheezing, rales, or rhonchi. Cardiovascular: Regular rate and rhythm, no murmurs, gallops or rubs. Abdomen: non-tender to palpation and soft, normoactive bowel sounds.  Musculoskeletal: Bilateral knee exams: Relatively neutral alignment of both lower extremities No  palpable effusion today, no signs for infection Tenderness over the anterior aspect  of both knees Both knees more tender laterally Otherwise neurovascular intact without evidence of significant lower extremity edema or cellulitis  Calves soft and nontender. Motor function intact in LE. Strength 5/5 LE bilaterally. Neuro: Distal pulses 2+. Sensation to light touch intact in LE.  Vital signs in last 24 hours:   Labs:   Estimated body mass index is 33.98 kg/m as calculated from the following:   Height as of 02/02/20: 4\' 11"  (1.499 m).   Weight as of 02/02/20: 76.3 kg.   Imaging Review Plain radiographs demonstrate severe degenerative joint disease of the right knee(s). The overall alignment isneutral. The bone quality appears to be adequate for age and reported activity level.  Assessment/Plan:  End stage arthritis, right knee   The patient history, physical examination, clinical judgment of the provider and imaging studies are consistent with end stage degenerative joint disease of the right knee(s) and total knee arthroplasty is deemed medically necessary. The treatment options including medical management, injection therapy arthroscopy and arthroplasty were discussed at length. The risks and benefits of total knee arthroplasty were presented and reviewed. The risks due to aseptic loosening, infection, stiffness, patella tracking problems, thromboembolic complications and other imponderables were discussed. The patient acknowledged the explanation, agreed to proceed with the plan and consent was signed. Patient is being admitted for inpatient treatment for surgery, pain control, PT, OT, prophylactic antibiotics, VTE prophylaxis, progressive ambulation and ADL's and discharge planning. The patient is planning to be discharged home.  Therapy Plans: outpatient therapy at [facility] Disposition: Home with family friend Planned DVT Prophylaxis: aspirin 81mg  BID DME needed: walker PCP: Dr. Maurice Small, clearance received Cardiologist: Dr. Dixie Dials, clearance pending.  (Echo on 2/18) TXA: IV Allergies: Bactrim - rash, NSAIDs - GI bleed/ulcers, prednisone - mood changes, statins - myalgias, sulfa drugs - rash, Versed - hyperactivity Anesthesia Concerns: none BMI: 32.8 Not diabetic.  Other: Takes Percocet 5-325 BID for pain management. Hx of atrial fibrillation.  **Planning to stay the night**   Patient's anticipated LOS is less than 2 midnights, meeting these requirements: - Younger than 57 - Lives within 1 hour of care - Has a competent adult at home to recover with post-op recover - NO history of  - Chronic pain requiring opiods  - Diabetes  - Coronary Artery Disease  - Heart failure  - Heart attack  - Stroke  - DVT/VTE  - Cardiac arrhythmia  - Respiratory Failure/COPD  - Renal failure  - Anemia  - Advanced Liver disease  - Patient was instructed on what medications to stop prior to surgery. - Follow-up visit in 2 weeks with Dr. Alvan Dame - Begin physical therapy following surgery - Pre-operative lab work as pre-surgical testing - Prescriptions will be provided in hospital at time of discharge  Griffith Citron, PA-C Orthopedic Surgery [facility] [place] [phone]

## 2020-02-06 NOTE — Anesthesia Preprocedure Evaluation (Addendum)
Anesthesia Evaluation  Patient identified by MRN, date of birth, ID band Patient awake    Reviewed: Allergy & Precautions, NPO status , Patient's Chart, lab work & pertinent test results  History of Anesthesia Complications (+) PONV and history of anesthetic complications  Airway Mallampati: III  TM Distance: >3 FB Neck ROM: Full    Dental no notable dental hx. (+) Dental Advisory Given,    Pulmonary shortness of breath, with exertion and at rest, sleep apnea , COPD, Current Smoker,  Non-compliant with CPAP 86 pack year history, still smoking SOB at rest as well as exertion- uses rescue inhaler about once a week    Pulmonary exam normal breath sounds clear to auscultation       Cardiovascular hypertension, Pt. on medications + angina + CAD and + PND  Normal cardiovascular exam+ dysrhythmias Supra Ventricular Tachycardia  Rhythm:Regular Rate:Normal  Echo 2017: CHFpEF - Left ventricle: The cavity size was normal. There was mildconcentric hypertrophy. Systolic function was normal. The estimated ejection fraction was in the range of 60% to 65%. Wallmotion was normal; there were no regional wall motionabnormalities. Doppler parameters are consistent with abnormalleft ventricular relaxation (grade 1 diastolic dysfunction).  - Mitral valve: Calcified annulus.   Cath 2017:  Prox RCA lesion, 45 %stenosed.  Ost LAD to Prox LAD lesion, 25 %stenosed.  Mid LAD to Dist LAD lesion, 40 %stenosed.  Dist LAD lesion, 60 %stenosed.  Ost 1st Mrg lesion, 20 %stenosed.  The left ventricular systolic function is normal.  LV end diastolic pressure is normal.  Ost 2nd Mrg lesion, 90 %stenosed.   Life style modification. Medical treatment as OM 2 is relatively small size vessel.  Cannot sleep on her back, feels like her chest is being crushed and she can't breath- sleeps on stomach or side   Neuro/Psych  Headaches, PSYCHIATRIC DISORDERS  Anxiety Depression agoraphobiaS/p ACDF 2002, 2008    GI/Hepatic Neg liver ROS, hiatal hernia, PUD, GERD  Medicated and Controlled,IBS, hx gastric ulcer 2004, hemorrhoids   Endo/Other  negative endocrine ROS  Renal/GU Renal InsufficiencyRenal diseaseCr 1.1   Urge incontinence, nocturia    Musculoskeletal  (+) Arthritis , Osteoarthritis,  Fibromyalgia -, narcotic dependentDDD, chronic LBP S/p back surgery 2007, 2019  Last CT lumbar spine 11/2019: Interval pedicle screw and interbody fusion at L3-4 and L4-5 without significant residual spinal stenosis. Mild subarticular stenosis on the right at L4-5 unchanged  Severe spinal stenosis L5-S1 has progressed. This is the level below the previous fusion.  Fibromyalgia- has been going to pain clinic and taking percocet for the last month, has been on and off opiates in the past   Abdominal (+) + obese,   Peds  Hematology  (+) Blood dyscrasia, anemia , Iron deficiency anemia   Anesthesia Other Findings   Reproductive/Obstetrics negative OB ROS                                                           Anesthesia Evaluation  Patient identified by MRN, date of birth, ID band Patient awake    Reviewed: Allergy & Precautions, NPO status , Patient's Chart, lab work & pertinent test results  History of Anesthesia Complications Negative for: history of anesthetic complications  Airway Mallampati: III  TM Distance: >3 FB Neck ROM: Full    Dental  (+)  Dental Advisory Given, Chipped,    Pulmonary asthma , sleep apnea , COPD,  COPD inhaler, Current Smoker,    Pulmonary exam normal breath sounds clear to auscultation       Cardiovascular hypertension, Pt. on medications + CAD  Normal cardiovascular exam Rhythm:Regular Rate:Normal  TTE 2017: EF 32-12%, grade 1 diastolic dysfunction    Neuro/Psych  Headaches, Anxiety Depression    GI/Hepatic hiatal hernia, GERD  Medicated,  Endo/Other   negative endocrine ROS  Renal/GU ARFRenal disease     Musculoskeletal  (+) Arthritis , Osteoarthritis,  Fibromyalgia -  Abdominal   Peds  Hematology  (+) anemia ,   Anesthesia Other Findings   Reproductive/Obstetrics                          Anesthesia Physical Anesthesia Plan  ASA: III  Anesthesia Plan: General   Post-op Pain Management:    Induction: Intravenous  PONV Risk Score and Plan: 3 and Treatment may vary due to age or medical condition, Ondansetron, Dexamethasone and Midazolam  Airway Management Planned: Oral ETT  Additional Equipment: Arterial line  Intra-op Plan:   Post-operative Plan: Extubation in OR  Informed Consent: I have reviewed the patients History and Physical, chart, labs and discussed the procedure including the risks, benefits and alternatives for the proposed anesthesia with the patient or authorized representative who has indicated his/her understanding and acceptance.   Dental advisory given  Plan Discussed with: CRNA, Anesthesiologist and Surgeon  Anesthesia Plan Comments: (See PAT note 11/19/2018 by Karoline Caldwell, PA-C )     Anesthesia Quick Evaluation  Anesthesia Physical Anesthesia Plan  ASA: III  Anesthesia Plan: Spinal, MAC and Regional   Post-op Pain Management:  Regional for Post-op pain   Induction:   PONV Risk Score and Plan: 2 and Propofol infusion and TIVA  Airway Management Planned: Natural Airway and Nasal Cannula  Additional Equipment: None  Intra-op Plan:   Post-operative Plan:   Informed Consent: I have reviewed the patients History and Physical, chart, labs and discussed the procedure including the risks, benefits and alternatives for the proposed anesthesia with the patient or authorized representative who has indicated his/her understanding and acceptance.       Plan Discussed with: CRNA  Anesthesia Plan Comments: (Will attempt spinal/MAC given pulmonary disease, PONV,  copious other comorbidities. Low threshold for conversion to GA/ETT given multiple prior lumbar surgeries- in this case, will likely need glidescope given 2 prior cervical spine fusions.)      Anesthesia Quick Evaluation

## 2020-02-10 ENCOUNTER — Observation Stay (HOSPITAL_COMMUNITY)
Admission: RE | Admit: 2020-02-10 | Discharge: 2020-02-11 | Disposition: A | Discharge status: Home / Self Care | Payer: Medicare Other | Attending: Orthopedic Surgery | Admitting: Orthopedic Surgery

## 2020-02-10 ENCOUNTER — Ambulatory Visit (HOSPITAL_COMMUNITY): Payer: Medicare Other | Admitting: Anesthesiology

## 2020-02-10 ENCOUNTER — Ambulatory Visit (HOSPITAL_COMMUNITY): Payer: Medicare Other | Admitting: Physician Assistant

## 2020-02-10 ENCOUNTER — Encounter (HOSPITAL_COMMUNITY): Payer: Self-pay | Admitting: Orthopedic Surgery

## 2020-02-10 ENCOUNTER — Other Ambulatory Visit: Payer: Self-pay

## 2020-02-10 ENCOUNTER — Encounter (HOSPITAL_COMMUNITY)
Admission: RE | Disposition: A | Discharge status: Home / Self Care | Payer: Self-pay | Source: Home / Self Care | Attending: Orthopedic Surgery

## 2020-02-10 DIAGNOSIS — F419 Anxiety disorder, unspecified: Secondary | ICD-10-CM | POA: Diagnosis not present

## 2020-02-10 DIAGNOSIS — K219 Gastro-esophageal reflux disease without esophagitis: Secondary | ICD-10-CM | POA: Diagnosis not present

## 2020-02-10 DIAGNOSIS — J449 Chronic obstructive pulmonary disease, unspecified: Secondary | ICD-10-CM | POA: Diagnosis not present

## 2020-02-10 DIAGNOSIS — I471 Supraventricular tachycardia: Secondary | ICD-10-CM | POA: Insufficient documentation

## 2020-02-10 DIAGNOSIS — M1711 Unilateral primary osteoarthritis, right knee: Secondary | ICD-10-CM | POA: Diagnosis not present

## 2020-02-10 DIAGNOSIS — G4733 Obstructive sleep apnea (adult) (pediatric): Secondary | ICD-10-CM | POA: Diagnosis not present

## 2020-02-10 DIAGNOSIS — F17210 Nicotine dependence, cigarettes, uncomplicated: Secondary | ICD-10-CM | POA: Diagnosis not present

## 2020-02-10 DIAGNOSIS — D649 Anemia, unspecified: Secondary | ICD-10-CM | POA: Insufficient documentation

## 2020-02-10 DIAGNOSIS — G8918 Other acute postprocedural pain: Secondary | ICD-10-CM | POA: Diagnosis not present

## 2020-02-10 DIAGNOSIS — Z9119 Patient's noncompliance with other medical treatment and regimen: Secondary | ICD-10-CM | POA: Diagnosis not present

## 2020-02-10 DIAGNOSIS — Z79899 Other long term (current) drug therapy: Secondary | ICD-10-CM | POA: Insufficient documentation

## 2020-02-10 DIAGNOSIS — M797 Fibromyalgia: Secondary | ICD-10-CM | POA: Diagnosis not present

## 2020-02-10 DIAGNOSIS — E785 Hyperlipidemia, unspecified: Secondary | ICD-10-CM | POA: Diagnosis not present

## 2020-02-10 DIAGNOSIS — F329 Major depressive disorder, single episode, unspecified: Secondary | ICD-10-CM | POA: Diagnosis not present

## 2020-02-10 DIAGNOSIS — E7800 Pure hypercholesterolemia, unspecified: Secondary | ICD-10-CM | POA: Diagnosis not present

## 2020-02-10 DIAGNOSIS — I2510 Atherosclerotic heart disease of native coronary artery without angina pectoris: Secondary | ICD-10-CM | POA: Diagnosis not present

## 2020-02-10 DIAGNOSIS — Z96659 Presence of unspecified artificial knee joint: Secondary | ICD-10-CM

## 2020-02-10 DIAGNOSIS — I10 Essential (primary) hypertension: Secondary | ICD-10-CM | POA: Diagnosis not present

## 2020-02-10 DIAGNOSIS — Z96651 Presence of right artificial knee joint: Secondary | ICD-10-CM

## 2020-02-10 HISTORY — PX: TOTAL KNEE ARTHROPLASTY: SHX125

## 2020-02-10 LAB — TYPE AND SCREEN
ABO/RH(D): A NEG
Antibody Screen: NEGATIVE

## 2020-02-10 SURGERY — ARTHROPLASTY, KNEE, TOTAL
Anesthesia: Monitor Anesthesia Care | Site: Knee | Laterality: Right

## 2020-02-10 MED ORDER — ONDANSETRON HCL 4 MG/2ML IJ SOLN: 4.0000 mg | Freq: Four times a day (QID) | INTRAMUSCULAR | Status: DC | PRN

## 2020-02-10 MED ORDER — SODIUM CHLORIDE (PF) 0.9 % IJ SOLN
INTRAMUSCULAR | Status: AC
  Filled 2020-02-10: qty 50

## 2020-02-10 MED ORDER — OXYCODONE HCL 5 MG PO TABS
5.0000 mg | ORAL_TABLET | Freq: Once | ORAL | Status: AC | PRN
  Administered 2020-02-10: 10:00:00 5 mg via ORAL

## 2020-02-10 MED ORDER — HYDROMORPHONE HCL 1 MG/ML IJ SOLN
0.5000 mg | INTRAMUSCULAR | Status: DC | PRN
  Administered 2020-02-10: 1 mg via INTRAVENOUS
  Filled 2020-02-10 (×3): qty 1

## 2020-02-10 MED ORDER — TIZANIDINE HCL 4 MG PO TABS: 4.0000 mg | ORAL_TABLET | Freq: Three times a day (TID) | ORAL | 0 refills | Status: DC | PRN

## 2020-02-10 MED ORDER — BUPROPION HCL ER (XL) 300 MG PO TB24
300.0000 mg | ORAL_TABLET | Freq: Every day | ORAL | Status: DC
  Administered 2020-02-11: 300 mg via ORAL
  Filled 2020-02-10: qty 1

## 2020-02-10 MED ORDER — MENTHOL 3 MG MT LOZG: 1.0000 | LOZENGE | OROMUCOSAL | Status: DC | PRN

## 2020-02-10 MED ORDER — PROPOFOL 500 MG/50ML IV EMUL
INTRAVENOUS | Status: DC | PRN
  Administered 2020-02-10: 50 ug/kg/min via INTRAVENOUS

## 2020-02-10 MED ORDER — OXYCODONE HCL 5 MG PO TABS: 5.0000 mg | ORAL_TABLET | ORAL | Status: DC | PRN

## 2020-02-10 MED ORDER — SODIUM CHLORIDE 0.9 % IV SOLN: INTRAVENOUS | Status: DC

## 2020-02-10 MED ORDER — PROPOFOL 10 MG/ML IV BOLUS
INTRAVENOUS | Status: DC | PRN
  Administered 2020-02-10 (×2): 20 mg via INTRAVENOUS
  Administered 2020-02-10: 10 mg via INTRAVENOUS

## 2020-02-10 MED ORDER — POLYETHYLENE GLYCOL 3350 17 G PO PACK: 17.0000 g | PACK | Freq: Two times a day (BID) | ORAL | 0 refills | Status: DC

## 2020-02-10 MED ORDER — METHOCARBAMOL 500 MG IVPB - SIMPLE MED
500.0000 mg | Freq: Four times a day (QID) | INTRAVENOUS | Status: DC | PRN
  Filled 2020-02-10: qty 50

## 2020-02-10 MED ORDER — PHENYLEPHRINE HCL (PRESSORS) 10 MG/ML IV SOLN
INTRAVENOUS | Status: AC
  Filled 2020-02-10: qty 1

## 2020-02-10 MED ORDER — CEFAZOLIN SODIUM-DEXTROSE 2-4 GM/100ML-% IV SOLN
2.0000 g | Freq: Four times a day (QID) | INTRAVENOUS | Status: AC
  Administered 2020-02-10 (×2): 2 g via INTRAVENOUS
  Filled 2020-02-10 (×2): qty 100

## 2020-02-10 MED ORDER — KETOROLAC TROMETHAMINE 30 MG/ML IJ SOLN
INTRAMUSCULAR | Status: DC | PRN
  Administered 2020-02-10: 30 mg via INTRA_ARTICULAR

## 2020-02-10 MED ORDER — ALUM & MAG HYDROXIDE-SIMETH 200-200-20 MG/5ML PO SUSP: 15.0000 mL | ORAL | Status: DC | PRN

## 2020-02-10 MED ORDER — DEXAMETHASONE SODIUM PHOSPHATE 10 MG/ML IJ SOLN
10.0000 mg | Freq: Once | INTRAMUSCULAR | Status: AC
  Administered 2020-02-11: 10 mg via INTRAVENOUS
  Filled 2020-02-10: qty 1

## 2020-02-10 MED ORDER — FERROUS SULFATE 325 (65 FE) MG PO TABS
325.0000 mg | ORAL_TABLET | Freq: Two times a day (BID) | ORAL | Status: DC
  Administered 2020-02-10 – 2020-02-11 (×2): 325 mg via ORAL
  Filled 2020-02-10 (×2): qty 1

## 2020-02-10 MED ORDER — METOPROLOL TARTRATE 12.5 MG HALF TABLET
12.5000 mg | ORAL_TABLET | Freq: Two times a day (BID) | ORAL | Status: DC
  Administered 2020-02-10 – 2020-02-11 (×2): 12.5 mg via ORAL
  Filled 2020-02-10 (×2): qty 1

## 2020-02-10 MED ORDER — HYDRALAZINE HCL 25 MG PO TABS
25.0000 mg | ORAL_TABLET | Freq: Two times a day (BID) | ORAL | Status: DC
  Administered 2020-02-10 – 2020-02-11 (×2): 25 mg via ORAL
  Filled 2020-02-10 (×3): qty 1

## 2020-02-10 MED ORDER — MIDAZOLAM HCL 2 MG/2ML IJ SOLN
INTRAMUSCULAR | Status: AC
  Filled 2020-02-10: qty 2

## 2020-02-10 MED ORDER — ACETAMINOPHEN 500 MG PO TABS
1000.0000 mg | ORAL_TABLET | Freq: Four times a day (QID) | ORAL | Status: AC
  Administered 2020-02-10 – 2020-02-11 (×4): 1000 mg via ORAL
  Filled 2020-02-10 (×4): qty 2

## 2020-02-10 MED ORDER — PHENOL 1.4 % MT LIQD: 1.0000 | OROMUCOSAL | Status: DC | PRN

## 2020-02-10 MED ORDER — METHOCARBAMOL 500 MG PO TABS
500.0000 mg | ORAL_TABLET | Freq: Four times a day (QID) | ORAL | Status: DC | PRN
  Administered 2020-02-10 – 2020-02-11 (×3): 500 mg via ORAL
  Filled 2020-02-10 (×3): qty 1

## 2020-02-10 MED ORDER — OXYCODONE HCL 5 MG PO TABS
10.0000 mg | ORAL_TABLET | ORAL | Status: DC | PRN
  Administered 2020-02-10 – 2020-02-11 (×2): 10 mg via ORAL
  Filled 2020-02-10 (×2): qty 2

## 2020-02-10 MED ORDER — FENTANYL CITRATE (PF) 100 MCG/2ML IJ SOLN
INTRAMUSCULAR | Status: AC
  Filled 2020-02-10: qty 2

## 2020-02-10 MED ORDER — ALBUTEROL SULFATE (2.5 MG/3ML) 0.083% IN NEBU: 2.5000 mg | INHALATION_SOLUTION | Freq: Four times a day (QID) | RESPIRATORY_TRACT | Status: DC | PRN

## 2020-02-10 MED ORDER — METOCLOPRAMIDE HCL 5 MG/ML IJ SOLN: 5.0000 mg | Freq: Three times a day (TID) | INTRAMUSCULAR | Status: DC | PRN

## 2020-02-10 MED ORDER — ACETAMINOPHEN 500 MG PO TABS: 1000.0000 mg | ORAL_TABLET | Freq: Three times a day (TID) | ORAL | 0 refills | Status: DC

## 2020-02-10 MED ORDER — KETOROLAC TROMETHAMINE 30 MG/ML IJ SOLN
INTRAMUSCULAR | Status: AC
  Filled 2020-02-10: qty 1

## 2020-02-10 MED ORDER — FERROUS SULFATE 325 (65 FE) MG PO TABS: 325.0000 mg | ORAL_TABLET | Freq: Three times a day (TID) | ORAL | 0 refills | Status: DC

## 2020-02-10 MED ORDER — CLONAZEPAM 1 MG PO TABS
1.0000 mg | ORAL_TABLET | Freq: Two times a day (BID) | ORAL | Status: DC
  Administered 2020-02-10 – 2020-02-11 (×2): 1 mg via ORAL
  Filled 2020-02-10 (×2): qty 1

## 2020-02-10 MED ORDER — DIPHENHYDRAMINE HCL 12.5 MG/5ML PO ELIX: 12.5000 mg | ORAL_SOLUTION | ORAL | Status: DC | PRN

## 2020-02-10 MED ORDER — LACTATED RINGERS IV BOLUS: 250.0000 mL | Freq: Once | INTRAVENOUS | Status: DC

## 2020-02-10 MED ORDER — POVIDONE-IODINE 10 % EX SWAB: 2.0000 "application " | Freq: Once | CUTANEOUS | Status: DC

## 2020-02-10 MED ORDER — DULOXETINE HCL 60 MG PO CPEP
60.0000 mg | ORAL_CAPSULE | Freq: Every day | ORAL | Status: DC
  Administered 2020-02-10: 60 mg via ORAL
  Filled 2020-02-10: qty 1

## 2020-02-10 MED ORDER — BUSPIRONE HCL 5 MG PO TABS
10.0000 mg | ORAL_TABLET | Freq: Two times a day (BID) | ORAL | Status: DC
  Administered 2020-02-10 – 2020-02-11 (×2): 10 mg via ORAL
  Filled 2020-02-10 (×2): qty 2

## 2020-02-10 MED ORDER — GABAPENTIN 100 MG PO CAPS
200.0000 mg | ORAL_CAPSULE | Freq: Every day | ORAL | Status: DC
  Administered 2020-02-10: 200 mg via ORAL
  Filled 2020-02-10: qty 2

## 2020-02-10 MED ORDER — CEFAZOLIN SODIUM-DEXTROSE 2-4 GM/100ML-% IV SOLN
2.0000 g | INTRAVENOUS | Status: AC
  Administered 2020-02-10: 08:00:00 2 g via INTRAVENOUS
  Filled 2020-02-10: qty 100

## 2020-02-10 MED ORDER — DEXAMETHASONE SODIUM PHOSPHATE 10 MG/ML IJ SOLN
INTRAMUSCULAR | Status: AC
  Filled 2020-02-10: qty 1

## 2020-02-10 MED ORDER — PROPOFOL 1000 MG/100ML IV EMUL
INTRAVENOUS | Status: AC
  Filled 2020-02-10: qty 100

## 2020-02-10 MED ORDER — LACTATED RINGERS IV SOLN: INTRAVENOUS | Status: DC

## 2020-02-10 MED ORDER — OXYCODONE HCL 5 MG PO TABS: 5.0000 mg | ORAL_TABLET | ORAL | 0 refills | Status: DC | PRN

## 2020-02-10 MED ORDER — SODIUM CHLORIDE 0.9 % IR SOLN
Status: DC | PRN
  Administered 2020-02-10: 1000 mL

## 2020-02-10 MED ORDER — FENTANYL CITRATE (PF) 100 MCG/2ML IJ SOLN
INTRAMUSCULAR | Status: DC | PRN
  Administered 2020-02-10 (×2): 50 ug via INTRAVENOUS

## 2020-02-10 MED ORDER — PROMETHAZINE HCL 25 MG/ML IJ SOLN: 6.2500 mg | INTRAMUSCULAR | Status: DC | PRN

## 2020-02-10 MED ORDER — PANTOPRAZOLE SODIUM 40 MG PO TBEC: 40.0000 mg | DELAYED_RELEASE_TABLET | Freq: Every day | ORAL | Status: DC | PRN

## 2020-02-10 MED ORDER — TRANEXAMIC ACID-NACL 1000-0.7 MG/100ML-% IV SOLN
1000.0000 mg | Freq: Once | INTRAVENOUS | Status: AC
  Administered 2020-02-10: 1000 mg via INTRAVENOUS
  Filled 2020-02-10: qty 100

## 2020-02-10 MED ORDER — METOCLOPRAMIDE HCL 5 MG PO TABS: 5.0000 mg | ORAL_TABLET | Freq: Three times a day (TID) | ORAL | Status: DC | PRN

## 2020-02-10 MED ORDER — LACTATED RINGERS IV BOLUS: 500.0000 mL | Freq: Once | INTRAVENOUS | Status: DC

## 2020-02-10 MED ORDER — STERILE WATER FOR IRRIGATION IR SOLN
Status: DC | PRN
  Administered 2020-02-10: 2000 mL

## 2020-02-10 MED ORDER — SCOPOLAMINE 1 MG/3DAYS TD PT72
1.0000 | MEDICATED_PATCH | TRANSDERMAL | Status: DC
  Administered 2020-02-10: 07:00:00 1.5 mg via TRANSDERMAL
  Filled 2020-02-10: qty 1

## 2020-02-10 MED ORDER — 0.9 % SODIUM CHLORIDE (POUR BTL) OPTIME
TOPICAL | Status: DC | PRN
  Administered 2020-02-10: 1000 mL

## 2020-02-10 MED ORDER — NITROGLYCERIN 0.4 MG SL SUBL: 0.4000 mg | SUBLINGUAL_TABLET | SUBLINGUAL | Status: DC | PRN

## 2020-02-10 MED ORDER — MEPIVACAINE HCL (PF) 2 % IJ SOLN
INTRAMUSCULAR | Status: AC
  Filled 2020-02-10: qty 20

## 2020-02-10 MED ORDER — PHENYLEPHRINE HCL-NACL 10-0.9 MG/250ML-% IV SOLN
INTRAVENOUS | Status: DC | PRN
  Administered 2020-02-10: 35 ug/min via INTRAVENOUS

## 2020-02-10 MED ORDER — ASPIRIN 81 MG PO CHEW
81.0000 mg | CHEWABLE_TABLET | Freq: Two times a day (BID) | ORAL | Status: DC
  Administered 2020-02-10 – 2020-02-11 (×2): 81 mg via ORAL
  Filled 2020-02-10 (×2): qty 1

## 2020-02-10 MED ORDER — OXYCODONE HCL 5 MG PO TABS
ORAL_TABLET | ORAL | Status: AC
  Filled 2020-02-10: qty 1

## 2020-02-10 MED ORDER — MEPIVACAINE HCL (PF) 2 % IJ SOLN
INTRAMUSCULAR | Status: DC | PRN
  Administered 2020-02-10: 3.2 mL via INTRATHECAL

## 2020-02-10 MED ORDER — CHLORHEXIDINE GLUCONATE 4 % EX LIQD: 60.0000 mL | Freq: Once | CUTANEOUS | Status: DC

## 2020-02-10 MED ORDER — OXYCODONE HCL 5 MG/5ML PO SOLN: 5.0000 mg | Freq: Once | ORAL | Status: AC | PRN

## 2020-02-10 MED ORDER — ONDANSETRON HCL 4 MG/2ML IJ SOLN
INTRAMUSCULAR | Status: AC
  Filled 2020-02-10: qty 2

## 2020-02-10 MED ORDER — BUPIVACAINE HCL 0.25 % IJ SOLN
INTRAMUSCULAR | Status: AC
  Filled 2020-02-10: qty 1

## 2020-02-10 MED ORDER — ONDANSETRON HCL 4 MG/2ML IJ SOLN
INTRAMUSCULAR | Status: DC | PRN
  Administered 2020-02-10: 4 mg via INTRAVENOUS

## 2020-02-10 MED ORDER — BISACODYL 10 MG RE SUPP: 10.0000 mg | Freq: Every day | RECTAL | Status: DC | PRN

## 2020-02-10 MED ORDER — PROPOFOL 500 MG/50ML IV EMUL
INTRAVENOUS | Status: AC
  Filled 2020-02-10: qty 50

## 2020-02-10 MED ORDER — HYDROMORPHONE HCL 1 MG/ML IJ SOLN: 0.2500 mg | INTRAMUSCULAR | Status: DC | PRN

## 2020-02-10 MED ORDER — ASPIRIN 81 MG PO CHEW: 81.0000 mg | CHEWABLE_TABLET | Freq: Two times a day (BID) | ORAL | 0 refills | Status: AC

## 2020-02-10 MED ORDER — BUPIVACAINE HCL (PF) 0.25 % IJ SOLN
INTRAMUSCULAR | Status: DC | PRN
  Administered 2020-02-10: 30 mL

## 2020-02-10 MED ORDER — DOCUSATE SODIUM 100 MG PO CAPS
100.0000 mg | ORAL_CAPSULE | Freq: Two times a day (BID) | ORAL | Status: DC
  Administered 2020-02-10 – 2020-02-11 (×2): 100 mg via ORAL
  Filled 2020-02-10 (×2): qty 1

## 2020-02-10 MED ORDER — TRANEXAMIC ACID-NACL 1000-0.7 MG/100ML-% IV SOLN
1000.0000 mg | INTRAVENOUS | Status: AC
  Administered 2020-02-10: 08:00:00 1000 mg via INTRAVENOUS
  Filled 2020-02-10: qty 100

## 2020-02-10 MED ORDER — SODIUM CHLORIDE (PF) 0.9 % IJ SOLN
INTRAMUSCULAR | Status: DC | PRN
  Administered 2020-02-10: 30 mL

## 2020-02-10 MED ORDER — ONDANSETRON HCL 4 MG PO TABS: 4.0000 mg | ORAL_TABLET | Freq: Four times a day (QID) | ORAL | Status: DC | PRN

## 2020-02-10 MED ORDER — DILTIAZEM HCL ER COATED BEADS 120 MG PO CP24
120.0000 mg | ORAL_CAPSULE | Freq: Every day | ORAL | Status: DC
  Administered 2020-02-11: 120 mg via ORAL
  Filled 2020-02-10: qty 1

## 2020-02-10 MED ORDER — HYDROCHLOROTHIAZIDE 25 MG PO TABS
25.0000 mg | ORAL_TABLET | Freq: Every day | ORAL | Status: DC
  Administered 2020-02-10 – 2020-02-11 (×2): 25 mg via ORAL
  Filled 2020-02-10 (×2): qty 1

## 2020-02-10 MED ORDER — MAGNESIUM CITRATE PO SOLN: 1.0000 | Freq: Once | ORAL | Status: DC | PRN

## 2020-02-10 MED ORDER — ACETAMINOPHEN 500 MG PO TABS
1000.0000 mg | ORAL_TABLET | Freq: Once | ORAL | Status: AC
  Administered 2020-02-10: 1000 mg via ORAL
  Filled 2020-02-10: qty 2

## 2020-02-10 MED ORDER — DEXAMETHASONE SODIUM PHOSPHATE 10 MG/ML IJ SOLN
10.0000 mg | Freq: Once | INTRAMUSCULAR | Status: AC
  Administered 2020-02-10: 10 mg via INTRAVENOUS

## 2020-02-10 MED ORDER — POLYETHYLENE GLYCOL 3350 17 G PO PACK
17.0000 g | PACK | Freq: Two times a day (BID) | ORAL | Status: DC
  Administered 2020-02-11: 17 g via ORAL
  Filled 2020-02-10 (×2): qty 1

## 2020-02-10 SURGICAL SUPPLY — 67 items
ADH SKN CLS APL DERMABOND .7 (GAUZE/BANDAGES/DRESSINGS) ×1
ATTUNE MED ANAT PAT 35 KNEE (Knees) ×1 IMPLANT
ATTUNE MED ANAT PAT 35MM KNEE (Knees) ×1 IMPLANT
ATTUNE PSFEM RTSZ3 NARCEM KNEE (Femur) ×2 IMPLANT
ATTUNE PSRP INSR SZ3 5 KNEE (Insert) ×1 IMPLANT
ATTUNE PSRP INSR SZ3 5MM KNEE (Insert) ×1 IMPLANT
BAG SPEC THK2 15X12 ZIP CLS (MISCELLANEOUS)
BAG ZIPLOCK 12X15 (MISCELLANEOUS) IMPLANT
BASEPLATE TIBIAL ROTATING SZ 4 (Knees) ×2 IMPLANT
BLADE SAW SGTL 11.0X1.19X90.0M (BLADE) ×2 IMPLANT
BLADE SAW SGTL 13.0X1.19X90.0M (BLADE) ×3 IMPLANT
BLADE SURG SZ10 CARB STEEL (BLADE) ×6 IMPLANT
BNDG CMPR MED 10X6 ELC LF (GAUZE/BANDAGES/DRESSINGS) ×1
BNDG ELASTIC 6X10 VLCR STRL LF (GAUZE/BANDAGES/DRESSINGS) ×2 IMPLANT
BNDG ELASTIC 6X5.8 VLCR STR LF (GAUZE/BANDAGES/DRESSINGS) ×3 IMPLANT
BOWL SMART MIX CTS (DISPOSABLE) ×3 IMPLANT
BSPLAT TIB 4 CMNT ROT PLAT STR (Knees) ×1 IMPLANT
CEMENT HV SMART SET (Cement) ×4 IMPLANT
COVER SURGICAL LIGHT HANDLE (MISCELLANEOUS) ×3 IMPLANT
COVER WAND RF STERILE (DRAPES) IMPLANT
CUFF TOURN SGL QUICK 34 (TOURNIQUET CUFF) ×3
CUFF TRNQT CYL 34X4.125X (TOURNIQUET CUFF) ×1 IMPLANT
DECANTER SPIKE VIAL GLASS SM (MISCELLANEOUS) ×6 IMPLANT
DERMABOND ADVANCED (GAUZE/BANDAGES/DRESSINGS) ×2
DERMABOND ADVANCED .7 DNX12 (GAUZE/BANDAGES/DRESSINGS) ×1 IMPLANT
DRAPE U-SHAPE 47X51 STRL (DRAPES) ×3 IMPLANT
DRESSING AQUACEL AG SP 3.5X10 (GAUZE/BANDAGES/DRESSINGS) ×1 IMPLANT
DRSG AQUACEL AG SP 3.5X10 (GAUZE/BANDAGES/DRESSINGS) ×3
DURAPREP 26ML APPLICATOR (WOUND CARE) ×6 IMPLANT
ELECT REM PT RETURN 15FT ADLT (MISCELLANEOUS) ×3 IMPLANT
GLOVE BIO SURGEON STRL SZ 6 (GLOVE) ×3 IMPLANT
GLOVE BIOGEL PI IND STRL 6.5 (GLOVE) ×1 IMPLANT
GLOVE BIOGEL PI IND STRL 7.5 (GLOVE) ×1 IMPLANT
GLOVE BIOGEL PI IND STRL 8.5 (GLOVE) ×1 IMPLANT
GLOVE BIOGEL PI INDICATOR 6.5 (GLOVE) ×2
GLOVE BIOGEL PI INDICATOR 7.5 (GLOVE) ×2
GLOVE BIOGEL PI INDICATOR 8.5 (GLOVE) ×2
GLOVE ECLIPSE 8.0 STRL XLNG CF (GLOVE) ×3 IMPLANT
GLOVE ORTHO TXT STRL SZ7.5 (GLOVE) ×3 IMPLANT
GOWN STRL REUS W/ TWL LRG LVL3 (GOWN DISPOSABLE) ×1 IMPLANT
GOWN STRL REUS W/TWL 2XL LVL3 (GOWN DISPOSABLE) ×3 IMPLANT
GOWN STRL REUS W/TWL LRG LVL3 (GOWN DISPOSABLE) ×6 IMPLANT
HANDPIECE INTERPULSE COAX TIP (DISPOSABLE) ×3
HOLDER FOLEY CATH W/STRAP (MISCELLANEOUS) IMPLANT
KIT TURNOVER KIT A (KITS) IMPLANT
MANIFOLD NEPTUNE II (INSTRUMENTS) ×3 IMPLANT
NDL SAFETY ECLIPSE 18X1.5 (NEEDLE) IMPLANT
NEEDLE HYPO 18GX1.5 SHARP (NEEDLE)
NS IRRIG 1000ML POUR BTL (IV SOLUTION) ×3 IMPLANT
PACK TOTAL KNEE CUSTOM (KITS) ×3 IMPLANT
PENCIL SMOKE EVACUATOR (MISCELLANEOUS) ×2 IMPLANT
PIN DRILL FIX HALF THREAD (BIT) ×2 IMPLANT
PIN FIX SIGMA LCS THRD HI (PIN) ×2 IMPLANT
PROTECTOR NERVE ULNAR (MISCELLANEOUS) ×3 IMPLANT
SET HNDPC FAN SPRY TIP SCT (DISPOSABLE) ×1 IMPLANT
SET PAD KNEE POSITIONER (MISCELLANEOUS) ×3 IMPLANT
SUT MNCRL AB 4-0 PS2 18 (SUTURE) ×3 IMPLANT
SUT STRATAFIX PDS+ 0 24IN (SUTURE) ×3 IMPLANT
SUT VIC AB 1 CT1 36 (SUTURE) ×3 IMPLANT
SUT VIC AB 2-0 CT1 27 (SUTURE) ×9
SUT VIC AB 2-0 CT1 TAPERPNT 27 (SUTURE) ×3 IMPLANT
SYR 3ML LL SCALE MARK (SYRINGE) ×3 IMPLANT
TRAY FOLEY MTR SLVR 14FR STAT (SET/KITS/TRAYS/PACK) ×2 IMPLANT
TRAY FOLEY MTR SLVR 16FR STAT (SET/KITS/TRAYS/PACK) ×1 IMPLANT
WATER STERILE IRR 1000ML POUR (IV SOLUTION) ×6 IMPLANT
WRAP KNEE MAXI GEL POST OP (GAUZE/BANDAGES/DRESSINGS) ×3 IMPLANT
YANKAUER SUCT BULB TIP 10FT TU (MISCELLANEOUS) ×3 IMPLANT

## 2020-02-10 NOTE — Interval H&P Note (Signed)
History and Physical Interval Note:  02/10/2020 7:16 AM  [name]  has presented today for surgery, with the diagnosis of Right knee osteoarthritis.  The various methods of treatment have been discussed with the patient and family. After consideration of risks, benefits and other options for treatment, the patient has consented to  Procedure(s) with comments: TOTAL KNEE ARTHROPLASTY (Right) - 70 mins as a surgical intervention.  The patient's history has been reviewed, patient examined, no change in status, stable for surgery.  I have reviewed the patient's chart and labs.  Questions were answered to the patient's satisfaction.     Mauri Pole

## 2020-02-10 NOTE — Anesthesia Procedure Notes (Signed)
Spinal  Patient location during procedure: OR Start time: 02/10/2020 7:23 AM End time: 02/10/2020 7:33 AM Staffing Performed: anesthesiologist  Anesthesiologist: Pervis Hocking, DO Preanesthetic Checklist Completed: patient identified, IV checked, risks and benefits discussed, surgical consent, monitors and equipment checked, pre-op evaluation and timeout performed Spinal Block Patient position: sitting Prep: DuraPrep and site prepped and draped Patient monitoring: cardiac monitor, continuous pulse ox and blood pressure Approach: midline Location: L3-4 Injection technique: single-shot Needle Needle type: Pencan and Whitacre  Needle gauge: 22 G Needle length: 9 cm Assessment Sensory level: T6 Additional Notes Functioning IV was confirmed and monitors were applied. Sterile prep and drape, including hand hygiene and sterile gloves were used. The patient was positioned and the spine was prepped. The skin was anesthetized with lidocaine.  Free flow of clear CSF was obtained prior to injecting local anesthetic into the CSF.  The spinal needle aspirated freely following injection.  The needle was carefully withdrawn.  The patient tolerated the procedure well.

## 2020-02-10 NOTE — Anesthesia Procedure Notes (Signed)
Procedure Name: MAC Date/Time: 02/10/2020 7:22 AM Performed by: Maxwell Caul, CRNA Pre-anesthesia Checklist: Patient identified, Emergency Drugs available, Suction available and Patient being monitored Oxygen Delivery Method: Simple face mask

## 2020-02-10 NOTE — Discharge Instructions (Signed)

## 2020-02-10 NOTE — Anesthesia Postprocedure Evaluation (Signed)
Anesthesia Post Note  Patient: [name]  Procedure(s) Performed: TOTAL KNEE ARTHROPLASTY (Right Knee)     Patient location during evaluation: PACU Anesthesia Type: Regional and Spinal Level of consciousness: oriented and awake and alert Pain management: pain level controlled Vital Signs Assessment: post-procedure vital signs reviewed and stable Respiratory status: spontaneous breathing and respiratory function stable Cardiovascular status: blood pressure returned to baseline and stable Postop Assessment: no headache, no backache, no apparent nausea or vomiting, patient able to bend at knees and spinal receding Anesthetic complications: no    Last Vitals:  Vitals:   02/10/20 1015 02/10/20 1028  BP: (!) 153/77 (!) 155/70  Pulse: 67 68  Resp: 19 17  Temp: 37.1 C 36.7 C  SpO2: 98% 98%    Last Pain:  Vitals:   02/10/20 1039  TempSrc:   PainSc: Craig

## 2020-02-10 NOTE — Anesthesia Procedure Notes (Signed)
Anesthesia Regional Block: Adductor canal block   Pre-Anesthetic Checklist: ,, timeout performed, Correct Patient, Correct Site, Correct Laterality, Correct Procedure, Correct Position, site marked, Risks and benefits discussed,  Surgical consent,  Pre-op evaluation,  At surgeon's request and post-op pain management  Laterality: Right  Prep: Maximum Sterile Barrier Precautions used, chloraprep       Needles:  Injection technique: Single-shot  Needle Type: Echogenic Stimulator Needle     Needle Length: 9cm  Needle Gauge: 22     Additional Needles:   Procedures:,,,, ultrasound used (permanent image in chart),,,,  Narrative:  Start time: 02/10/2020 7:00 AM End time: 02/10/2020 7:05 AM Injection made incrementally with aspirations every 5 mL.  Performed by: Personally  Anesthesiologist: Pervis Hocking, DO  Additional Notes: Monitors applied. No increased pain on injection. No increased resistance to injection. Injection made in 5cc increments. Good needle visualization. Patient tolerated procedure well.

## 2020-02-10 NOTE — Care Plan (Signed)
Ortho Bundle Case Management Note  Patient Details  Name: [name] MRN: [id number] Date of Birth: October 18, 1956  R TKA on 02-10-20 DCP:  Home with family friend.  Lives in a mobile home with 4 ste. DME:  RW ordered through [place].  Has a 3-in-1. PT:  [facility].  PT eval scheduled on 02-13-20.                   DME Arranged:  Gilford Rile rolling DME Agency:  [employer]  HH Arranged:  NA [place] Agency:  NA  Additional Comments: Please contact me with any questions of if this plan should need to change.  Marianne Sofia, RN,CCM [facility]  [phone] 02/10/2020, 8:37 AM

## 2020-02-10 NOTE — Op Note (Signed)
NAME:  [name]                      MEDICAL RECORD NO.:  [id number]                             FACILITY:  [facility]      PHYSICIAN:  Pietro Cassis. Alvan Dame, M.D.  DATE OF BIRTH:  10-Mar-1956      DATE OF PROCEDURE:  02/10/2020                                     OPERATIVE REPORT         PREOPERATIVE DIAGNOSIS:  Right knee osteoarthritis.      POSTOPERATIVE DIAGNOSIS:  Right knee osteoarthritis.      FINDINGS:  The patient was noted to have complete loss of cartilage and   bone-on-bone arthritis with associated osteophytes in the lateral and patellofemoral compartments of   the knee with a significant synovitis and associated effusion.  The patient had failed months of conservative treatment including medications, injection therapy, activity modification.     PROCEDURE:  Right total knee replacement.      COMPONENTS USED:  DePuy Attune rotating platform posterior stabilized knee   system, a size 3N femur, 4 tibia, size 5 mm PS AOX insert, and 35 anatomic patellar   button.      SURGEON:  Pietro Cassis. Alvan Dame, M.D.      ASSISTANT:  Griffith Citron, PA-C.      ANESTHESIA:  Regional and Spinal.      SPECIMENS:  None.      COMPLICATION:  None.      DRAINS:  None.  EBL: <100cc      TOURNIQUET TIME:   Total Tourniquet Time Documented: Thigh (Left) - 30 minutes Total: Thigh (Left) - 30 minutes  .      The patient was stable to the recovery room.      INDICATION FOR PROCEDURE:  [name] is a 64 y.o. female patient of   mine.  The patient had been seen, evaluated, and treated for months conservatively in the   office with medication, activity modification, and injections.  The patient had   radiographic changes of bone-on-bone arthritis with endplate sclerosis and osteophytes noted.  Based on the radiographic changes and failed conservative measures, the patient   decided to proceed with definitive treatment, total knee replacement.  Risks of infection, DVT, component failure, need  for revision surgery, neurovascular injury were reviewed in the office setting.  The postop course was reviewed stressing the efforts to maximize post-operative satisfaction and function.  Consent was obtained for benefit of pain   relief.      PROCEDURE IN DETAIL:  The patient was brought to the operative theater.   Once adequate anesthesia, preoperative antibiotics, 2 gm of Ancef,1 gm of Tranexamic Acid, and 10 mg of Decadron administered, the patient was positioned supine with a right thigh tourniquet placed.  The  right lower extremity was prepped and draped in sterile fashion.  A time-   out was performed identifying the patient, planned procedure, and the appropriate extremity.      The right lower extremity was placed in the [facility] leg holder.  The leg was   exsanguinated, tourniquet elevated to 250 mmHg.  A midline incision was   made  followed by median parapatellar arthrotomy.  Following initial   exposure, attention was first directed to the patella.  Precut   measurement was noted to be 21 mm.  I resected down to 13-14 mm and used a   35 anatomic patellar button to restore patellar height as well as cover the cut surface.      The lug holes were drilled and a metal shim was placed to protect the   patella from retractors and saw blade during the procedure.      At this point, attention was now directed to the femur.  The femoral   canal was opened with a drill, irrigated to try to prevent fat emboli.  An   intramedullary rod was passed at 3 degrees valgus, 9 mm of bone was   resected off the distal femur.  Following this resection, the tibia was   subluxated anteriorly.  Using the extramedullary guide, 4 mm of bone was resected off   the proximal medial tibia.  We confirmed the gap would be   stable medially and laterally with a size 5 spacer block as well as confirmed that the tibial cut was perpendicular in the coronal plane, checking with an alignment rod.      Once this was  done, I sized the femur to be a size 3 in the anterior-   posterior dimension, chose a narrow component based on medial and   lateral dimension.  The size 3 rotation block was then pinned in   position anterior referenced using the C-clamp to set rotation.  The   anterior, posterior, and  chamfer cuts were made without difficulty nor   notching making certain that I was along the anterior cortex to help   with flexion gap stability.      The final box cut was made off the lateral aspect of distal femur.      At this point, the tibia was sized to be a size 4.  The size 4 tray was   then pinned in position through the medial third of the tubercle,   drilled, and keel punched.  Trial reduction was now carried with a 3 femur,  4 tibia, a size 5 mm PS insert, and the 35 anatomic patella botton.  The knee was brought to full extension with good flexion stability with the patella   tracking through the trochlea without application of pressure.  Given   all these findings the trial components removed.  Final components were   opened and cement was mixed.  The knee was irrigated with normal saline solution and pulse lavage.  The synovial lining was   then injected with 30 cc of 0.25% Marcaine with epinephrine, 1 cc of Toradol and 30 cc of NS for a total of 61 cc.     Final implants were then cemented onto cleaned and dried cut surfaces of bone with the knee brought to extension with a size 5 mm PS trial insert.      Once the cement had fully cured, excess cement was removed   throughout the knee.  I confirmed that I was satisfied with the range of   motion and stability, and the final size 5 mm PS AOX insert was chosen.  It was   placed into the knee.      The tourniquet had been let down at 30 minutes.  No significant   hemostasis was required.  The extensor mechanism was then reapproximated using #1 Vicryl and #  1 Stratafix sutures with the knee   in flexion.  The   remaining wound was closed  with 2-0 Vicryl and running 4-0 Monocryl.   The knee was cleaned, dried, dressed sterilely using Dermabond and   Aquacel dressing.  The patient was then   brought to recovery room in stable condition, tolerating the procedure   well.   Please note that Physician Assistant, Griffith Citron, PA-C was present for the entirety of the case, and was utilized for pre-operative positioning, peri-operative retractor management, general facilitation of the procedure and for primary wound closure at the end of the case.              Pietro Cassis Alvan Dame, M.D.    02/10/2020 8:55 AM

## 2020-02-10 NOTE — Transfer of Care (Signed)
Immediate Anesthesia Transfer of Care Note  Patient: BRITTNY SANROMAN  Procedure(s) Performed: TOTAL KNEE ARTHROPLASTY (Right Knee)  Patient Location: PACU  Anesthesia Type:Spinal  Level of Consciousness: awake, alert  and oriented  Airway & Oxygen Therapy: Patient Spontanous Breathing and Patient connected to face mask oxygen  Post-op Assessment: Report given to RN and Post -op Vital signs reviewed and stable  Post vital signs: Reviewed and stable  Last Vitals:  Vitals Value Taken Time  BP 113/61 02/10/20 0924  Temp    Pulse 65 02/10/20 0925  Resp 19 02/10/20 0925  SpO2 100 % 02/10/20 0925  Vitals shown include unvalidated device data.  Last Pain:  Vitals:   02/10/20 0550  TempSrc: Oral         Complications: No apparent anesthesia complications

## 2020-02-10 NOTE — Evaluation (Signed)
Physical Therapy Evaluation Patient Details Name: [name] MRN: [id number] DOB: December 07, 1956 Today's Date: 02/10/2020   History of Present Illness  Patient is 64 y.o. female s/p Rt TKA on 02/10/20 with PMH significant for CAD, HTN, fibromyalgia, SVT, HLD, GERD, COPD, OA, anxiety, XLIF L3-5 and posterior spinal fusion (left) L3-5.    Clinical Impression  [name] is a 64 y.o. female POD 0 s/p Rt TKA. Patient reports modified independence with walking stick for mobility at baseline. Patient is now limited by functional impairments (see PT problem list below) and requires min assist for transfers and gait with RW. Patient was able to ambulate ~40 feet with RW and min assist for safe walker management. Patient instructed in exercise to facilitate ROM and circulation. Patient will benefit from continued skilled PT interventions to address impairments and progress towards PLOF. Acute PT will follow to progress mobility and stair training in preparation for safe discharge home.     Follow Up Recommendations Follow surgeon's recommendation for DC plan and follow-up therapies    Equipment Recommendations  Rolling walker with 5" wheels(youth walker)    Recommendations for Other Services       Precautions / Restrictions Precautions Precautions: Fall Restrictions Weight Bearing Restrictions: No      Mobility  Bed Mobility Overal bed mobility: Needs Assistance Bed Mobility: Supine to Sit     Supine to sit: HOB elevated;Min assist     General bed mobility comments: cues for use of bed rail and assist to bring LE's to EOB, assist to raise trunk and scoot forward to place feet on floor.  Transfers Overall transfer level: Needs assistance Equipment used: Rolling walker (2 wheeled) Transfers: Sit to/from Stand Sit to Stand: Min assist         General transfer comment: cues for safe hand palcement/technique with RW, assist to initiate power up and complete rise to  stand  Ambulation/Gait Ambulation/Gait assistance: Min assist Gait Distance (Feet): 40 Feet Assistive device: Rolling walker (2 wheeled) Gait Pattern/deviations: Step-to pattern;Trunk flexed;Decreased stride length;Decreased step length - left;Decreased stance time - right;Decreased weight shift to right Gait velocity: decreased   General Gait Details: pt required intermittent cues for safe step to pattern and to maintian safe proximity to RW, assist required for positioning RW.  Stairs            Wheelchair Mobility    Modified Rankin (Stroke Patients Only)       Balance Overall balance assessment: Needs assistance Sitting-balance support: Feet supported Sitting balance-Leahy Scale: Good     Standing balance support: During functional activity;Bilateral upper extremity supported Standing balance-Leahy Scale: Poor                Pertinent Vitals/Pain Pain Assessment: 0-10 Pain Score: 10-Worst pain ever Pain Location: Rt knee Pain Descriptors / Indicators: Aching;Sore Pain Intervention(s): Monitored during session;Patient requesting pain meds-RN notified;Repositioned;Ice applied    Home Living Family/patient expects to be discharged to:: Private residence Living Arrangements: Alone Available Help at Discharge: Friend(s);Other (Comment)(pt's granddaughter's ex is staying with her for several days and then a different friend will stay with her W-Sat.) Type of Home: Mobile home Home Access: Stairs to enter Entrance Stairs-Rails: Right;Left Entrance Stairs-Number of Steps: 4+1 Home Layout: One level Home Equipment: [place] - 4 wheels;Cane - single point;Shower seat      Prior Function Level of Independence: Independent with assistive device(s)         Comments: pt has been using walking stick to mobilize  Hand Dominance   Dominant Hand: Left    Extremity/Trunk Assessment   Upper Extremity Assessment Upper Extremity Assessment: Overall WFL for  tasks assessed    Lower Extremity Assessment Lower Extremity Assessment: RLE deficits/detail RLE Deficits / Details: pt with good quad activation, no extensor lag with SLR RLE Sensation: WNL RLE Coordination: WNL    Cervical / Trunk Assessment Cervical / Trunk Assessment: Kyphotic  Communication   Communication: No difficulties  Cognition Arousal/Alertness: Awake/alert Behavior During Therapy: WFL for tasks assessed/performed Overall Cognitive Status: Within Functional Limits for tasks assessed             General Comments      Exercises Total Joint Exercises Ankle Circles/Pumps: AROM;Seated;Both;15 reps Quad Sets: AROM;Seated;Right;5 reps Heel Slides: AROM;Seated;Right;5 reps   Assessment/Plan    PT Assessment Patient needs continued PT services  PT Problem List Decreased strength;Decreased mobility;Decreased range of motion;Decreased activity tolerance;Decreased balance;Decreased knowledge of use of DME       PT Treatment Interventions DME instruction;Therapeutic exercise;Balance training;Gait training;Stair training;Functional mobility training;Therapeutic activities;Patient/family education    PT Goals (Current goals can be found in the Care Plan section)  Acute Rehab PT Goals Patient Stated Goal: to get the Lt kene replaced next PT Goal Formulation: With patient Time For Goal Achievement: 02/17/20 Potential to Achieve Goals: Good    Frequency 7X/week        AM-PAC PT "6 Clicks" Mobility  Outcome Measure Help needed turning from your back to your side while in a flat bed without using bedrails?: A Little Help needed moving from lying on your back to sitting on the side of a flat bed without using bedrails?: A Little Help needed moving to and from a bed to a chair (including a wheelchair)?: A Little Help needed standing up from a chair using your arms (e.g., wheelchair or bedside chair)?: A Little Help needed to walk in hospital room?: A Little Help  needed climbing 3-5 steps with a railing? : A Little 6 Click Score: 18    End of Session Equipment Utilized During Treatment: Gait belt Activity Tolerance: Patient tolerated treatment well Patient left: with chair alarm set;in chair;with call bell/phone within reach Nurse Communication: Mobility status PT Visit Diagnosis: Muscle weakness (generalized) (M62.81);Difficulty in walking, not elsewhere classified (R26.2)    Time: 1240-1303 PT Time Calculation (min) (ACUTE ONLY): 23 min   Charges:   PT Evaluation $PT Eval Low Complexity: 1 Low PT Treatments $Therapeutic Exercise: 8-22 mins        Verner Mould, DPT Physical Therapist with [facility] [phone]  02/10/2020 2:10 PM

## 2020-02-11 ENCOUNTER — Encounter: Payer: Self-pay | Admitting: *Deleted

## 2020-02-11 DIAGNOSIS — G4733 Obstructive sleep apnea (adult) (pediatric): Secondary | ICD-10-CM | POA: Diagnosis not present

## 2020-02-11 DIAGNOSIS — I2510 Atherosclerotic heart disease of native coronary artery without angina pectoris: Secondary | ICD-10-CM | POA: Diagnosis not present

## 2020-02-11 DIAGNOSIS — M1711 Unilateral primary osteoarthritis, right knee: Secondary | ICD-10-CM | POA: Diagnosis not present

## 2020-02-11 DIAGNOSIS — I10 Essential (primary) hypertension: Secondary | ICD-10-CM | POA: Diagnosis not present

## 2020-02-11 DIAGNOSIS — J449 Chronic obstructive pulmonary disease, unspecified: Secondary | ICD-10-CM | POA: Diagnosis not present

## 2020-02-11 DIAGNOSIS — F329 Major depressive disorder, single episode, unspecified: Secondary | ICD-10-CM | POA: Diagnosis not present

## 2020-02-11 LAB — BASIC METABOLIC PANEL
Anion gap: 11 (ref 5–15)
BUN: 14 mg/dL (ref 8–23)
CO2: 27 mmol/L (ref 22–32)
Calcium: 9.4 mg/dL (ref 8.9–10.3)
Chloride: 101 mmol/L (ref 98–111)
Creatinine, Ser: 0.99 mg/dL (ref 0.44–1.00)
GFR calc Af Amer: >60 mL/min (ref >60)
GFR calc non Af Amer: 59 mL/min — ABNORMAL LOW (ref >60)
Glucose, Bld: 156 mg/dL — ABNORMAL HIGH (ref 70–99)
Potassium: 3.7 mmol/L (ref 3.5–5.1)
Sodium: 139 mmol/L (ref 135–145)

## 2020-02-11 LAB — CBC
HCT: 36.9 % (ref 36.0–46.0)
Hemoglobin: 11.8 g/dL — ABNORMAL LOW (ref 12.0–15.0)
MCH: 29.9 pg (ref 26.0–34.0)
MCHC: 32 g/dL (ref 30.0–36.0)
MCV: 93.4 fL (ref 80.0–100.0)
Platelets: 309 10*3/uL (ref 150–400)
RBC: 3.95 MIL/uL (ref 3.87–5.11)
RDW: 13.7 % (ref 11.5–15.5)
WBC: 26.5 10*3/uL — ABNORMAL HIGH (ref 4.0–10.5)
nRBC: 0 % (ref 0.0–0.2)

## 2020-02-11 NOTE — Progress Notes (Signed)
Physical Therapy Treatment Patient Details Name: [name] MRN: [id number] DOB: 1956/03/22 Today's Date: 02/11/2020    History of Present Illness Patient is 64 y.o. female s/p Rt TKA on 02/10/20 with PMH significant for CAD, HTN, fibromyalgia, SVT, HLD, GERD, COPD, OA, anxiety, XLIF L3-5 and posterior spinal fusion (left) L3-5.    PT Comments    The patient instructed and performed  All exercises for TKA to be performed until OPPT appt. Patient demonstrates  The exercises. Patient  For Dc  To day  Follow Up Recommendations  Follow surgeon's recommendation for DC plan and follow-up therapies     Equipment Recommendations  Rolling walker with 5" wheels    Recommendations for Other Services       Precautions / Restrictions Precautions Precautions: Fall;Knee    Mobility  Bed Mobility Overal bed mobility: Needs Assistance Bed Mobility: Supine to Sit;Sit to Supine     Supine to sit: Supervision     General bed mobility comments: no external assitance for supine to sit to supine, manages  R LE well.  Transfers Overall transfer level: Needs assistance Equipment used: Rolling walker (2 wheeled) Transfers: Sit to/from Stand Sit to Stand: Supervision         General transfer comment: cues for safe hand placement/technique with RW,  Ambulation/Gait Ambulation/Gait assistance: Min guard Gait Distance (Feet): 20 Feet(x 2) Assistive device: Rolling walker (2 wheeled) Gait Pattern/deviations: Step-to pattern;Trunk flexed;Decreased stride length;Decreased step length - left;Decreased stance time - right;Decreased weight shift to right Gait velocity: decreased   General Gait Details: intermittent cues for safety and position inside RW.   Stairs Stairs: Yes Stairs assistance: Min assist Stair Management: One rail Right;Forwards   General stair comments: practiced 2 steps using a rail and 2 steps backwards for no rail. provided hand out for family   Wheelchair  Mobility    Modified Rankin (Stroke Patients Only)       Balance Overall balance assessment: No apparent balance deficits (not formally assessed)         Standing balance support: During functional activity;Bilateral upper extremity supported                                Cognition Arousal/Alertness: Awake/alert Behavior During Therapy: WFL for tasks assessed/performed Overall Cognitive Status: Within Functional Limits for tasks assessed                                        Exercises Total Joint Exercises Quad Sets: AROM;Seated;Right;Left;10 reps Short Arc Quad: AROM;Right;10 reps;Supine Heel Slides: AROM;Right;10 reps;Supine Hip ABduction/ADduction: AROM;Right;10 reps;Supine Straight Leg Raises: AROM;AAROM;Right;10 reps;Supine Long Arc Quad: AROM;Right;10 reps;Supine Knee Flexion: AROM;Right;10 reps Goniometric ROM: 5-95 right knee flexion    General Comments        Pertinent Vitals/Pain Pain Score: 10-Worst pain ever Pain Location: Rt knee Pain Descriptors / Indicators: Aching;Sore Pain Intervention(s): Monitored during session;Premedicated before session    Home Living                      Prior Function            PT Goals (current goals can now be found in the care plan section) Progress towards PT goals: Progressing toward goals    Frequency    7X/week      PT Plan Current  plan remains appropriate    Co-evaluation              AM-PAC PT "6 Clicks" Mobility   Outcome Measure  Help needed turning from your back to your side while in a flat bed without using bedrails?: None Help needed moving from lying on your back to sitting on the side of a flat bed without using bedrails?: None Help needed moving to and from a bed to a chair (including a wheelchair)?: A Little Help needed standing up from a chair using your arms (e.g., wheelchair or bedside chair)?: A Little Help needed to walk in hospital  room?: A Little Help needed climbing 3-5 steps with a railing? : A Little 6 Click Score: 20    End of Session Equipment Utilized During Treatment: Gait belt Activity Tolerance: Patient tolerated treatment well Patient left: in bed;with call bell/phone within reach Nurse Communication: Mobility status PT Visit Diagnosis: Muscle weakness (generalized) (M62.81);Difficulty in walking, not elsewhere classified (R26.2)     Time: [id number] PT Time Calculation (min) (ACUTE ONLY): 37 min  Charges:  $Gait Training: 8-22 mins $Therapeutic Exercise: 8-22 mins                     [name] PT Acute Rehabilitation Services Pager [phone] Office [phone]    [name] 02/11/2020, 1:34 PM

## 2020-02-11 NOTE — Progress Notes (Signed)
     Subjective: 1 Day Post-Op Procedure(s) (LRB): TOTAL KNEE ARTHROPLASTY (Right)   Patient reports pain as mild, pain controlled medication.  No events throughout the night.  Dr. Alvan Dame discussed the procedure, findings and expectations moving forward.  Patient is ready be discharged home, if she does well therapy.   Patient's anticipated LOS is less than 2 midnights, meeting these requirements: - Lives within 1 hour of care - Has a competent adult at home to recover with post-op recover - NO history of  - Diabetes  - Heart failure  - Heart attack  - Stroke  - DVT/VTE  - Cardiac arrhythmia  - Respiratory Failure/COPD  - Renal failure  - Anemia  - Advanced Liver disease     Objective:   VITALS:   Vitals:   02/11/20 0250 02/11/20 0553  BP: (!) 151/78 (!) 154/67  Pulse: 100 92  Resp: 20 18  Temp: 98.1 F (36.7 C) 98 F (36.7 C)  SpO2: 91% 93%    Dorsiflexion/Plantar flexion intact Incision: dressing C/D/I No cellulitis present Compartment soft  LABS Recent Labs    02/11/20 0509  HGB 11.8*  HCT 36.9  WBC 26.5*  PLT 309    Recent Labs    02/11/20 0509  NA 139  K 3.7  BUN 14  CREATININE 0.99  GLUCOSE 156*     Assessment/Plan: 1 Day Post-Op Procedure(s) (LRB): TOTAL KNEE ARTHROPLASTY (Right) Foley cath d/c'ed Advance diet Up with therapy D/C IV fluids Discharge home Follow up in 2 weeks at [facility] Follow up with OLIN,Garan Frappier D in 2 weeks.  Contact information:  [facility] [address] [place] [phone]    Obese (BMI 30-39.9) Estimated body mass index is 33.12 kg/m as calculated from the following:   Height as of this encounter: 4\' 11"  (1.499 m).   Weight as of this encounter: 74.4 kg. Patient also counseled that weight may inhibit the healing process Patient counseled that losing weight will help with future health issues       Danae Orleans PA-C  [facility]  [place]  [address], [place], [place] [address] Phone: [phone] [email or web address] Facebook  [employer]

## 2020-02-11 NOTE — Progress Notes (Signed)
D/C instructions given to patient. Patient had no questions. NT or writer will wheel patient out once family comes in to pick her up

## 2020-02-11 NOTE — Progress Notes (Signed)
Physical Therapy Treatment Patient Details Name: [name] MRN: [id number] DOB: 04-17-1956 Today's Date: 02/11/2020    History of Present Illness Patient is 64 y.o. female s/p Rt TKA on 02/10/20 with PMH significant for CAD, HTN, fibromyalgia, SVT, HLD, GERD, COPD, OA, anxiety, XLIF L3-5 and posterior spinal fusion (left) L3-5.    PT Comments    The patient is progressing well. Instructed in stairs. Will return after medicated for HEP instruction.   Follow Up Recommendations  Follow surgeon's recommendation for DC plan and follow-up therapies     Equipment Recommendations  Rolling walker with 5" wheels(youth)    Recommendations for Other Services       Precautions / Restrictions Precautions Precautions: Fall;Knee Restrictions Weight Bearing Restrictions: No    Mobility  Bed Mobility   Bed Mobility: Supine to Sit     Supine to sit: Supervision     General bed mobility comments: no external assitance for supine to sit to supine, manages  R LE well.  Transfers Overall transfer level: Needs assistance Equipment used: Rolling walker (2 wheeled) Transfers: Sit to/from Stand Sit to Stand: Supervision         General transfer comment: cues for safe hand placement/technique with RW,  Ambulation/Gait Ambulation/Gait assistance: Min guard Gait Distance (Feet): 40 Feet Assistive device: Rolling walker (2 wheeled) Gait Pattern/deviations: Step-to pattern;Trunk flexed;Decreased stride length;Decreased step length - left;Decreased stance time - right;Decreased weight shift to right Gait velocity: decreased   General Gait Details: intermittent cues for safety and position inside RW.   Stairs Stairs: Yes Stairs assistance: Min assist Stair Management: One rail Right;Forwards   General stair comments: practiced 2 steps using a rail and 2 steps backwards for no rail. provided hand out for family   Wheelchair Mobility    Modified Rankin (Stroke Patients Only)        Balance Overall balance assessment: No apparent balance deficits (not formally assessed)         Standing balance support: During functional activity;Bilateral upper extremity supported                                Cognition Arousal/Alertness: Awake/alert Behavior During Therapy: WFL for tasks assessed/performed Overall Cognitive Status: Within Functional Limits for tasks assessed                                        Exercises      General Comments        Pertinent Vitals/Pain Pain Score: 10-Worst pain ever Pain Location: Rt knee Pain Descriptors / Indicators: Aching;Sore Pain Intervention(s): Monitored during session;Patient requesting pain meds-RN notified;RN gave pain meds during session;Ice applied    Home Living                      Prior Function            PT Goals (current goals can now be found in the care plan section) Progress towards PT goals: Progressing toward goals    Frequency    7X/week      PT Plan Current plan remains appropriate    Co-evaluation              AM-PAC PT "6 Clicks" Mobility   Outcome Measure  Help needed turning from your back to your side while in a flat  bed without using bedrails?: None Help needed moving from lying on your back to sitting on the side of a flat bed without using bedrails?: None Help needed moving to and from a bed to a chair (including a wheelchair)?: A Little Help needed standing up from a chair using your arms (e.g., wheelchair or bedside chair)?: A Little Help needed to walk in hospital room?: A Little Help needed climbing 3-5 steps with a railing? : A Little 6 Click Score: 20    End of Session Equipment Utilized During Treatment: Gait belt Activity Tolerance: Patient tolerated treatment well Patient left: in bed;with call bell/phone within reach Nurse Communication: Mobility status PT Visit Diagnosis: Muscle weakness (generalized)  (M62.81);Difficulty in walking, not elsewhere classified (R26.2)     Time: [id number] PT Time Calculation (min) (ACUTE ONLY): 34 min  Charges:  $Gait Training: 23-37 mins                     [place] Pager [phone] Office [phone]    Claretha Cooper 02/11/2020, 11:39 AM

## 2020-02-12 NOTE — Discharge Summary (Signed)
Physician Discharge Summary  Patient ID: [name] MRN: [id number] DOB/AGE: [birth year] 64 y.o.  Admit date: 02/10/2020 Discharge date: 02/11/2020   Procedures:  Procedure(s) (LRB): TOTAL KNEE ARTHROPLASTY (Right)  Attending Physician:  Dr. Paralee Cancel   Admission Diagnoses:   Right knee pain  Discharge Diagnoses:  Principal Problem:   S/P right TKA  Past Medical History:  Diagnosis Date  . Agoraphobia without history of panic disorder   . Anginal pain ([place])   . Anxiety   . Arthritis    "qwhere" (04/03/2018)  . Childhood asthma   . Chronic bronchitis ([place])   . Chronic lower back pain   . COPD (chronic obstructive pulmonary disease) ([place])   . Coronary artery disease CARDIOLOGIST- DR  Doylene Canard  (VISIT 03-30-11 W/ CHART)   NON-OBS. CAD   (STRESS TEST NOV. 2011  . Daily headache   . DDD (degenerative disc disease)   . Depression   . DJD (degenerative joint disease)    JOINT PAIN  . Dyspnea    "anytime but mostly on exertion and smoking"  . Elevated liver enzymes   . Fibromyalgia   . Frequency of urination   . GERD (gastroesophageal reflux disease) AND HIATIAL HERNIA   CONTROLLED W/ NEXIUM  . Heart murmur   . Hemorrhoids   . High cholesterol   . History of blood transfusion    "related to anemia" (04/03/2018)  . History of carpal tunnel syndrome    Right  . History of gastric ulcer 2004  . History of hiatal hernia   . Hypertension   . IBS (irritable bowel syndrome)   . Incisional pain s/p interstim implant 1st stage--- 12-07-11    left upper buttock-- pt states dressing clean dry and intact (on 12-08-11)  . Insomnia   . Iron deficiency anemia   . Leukocytosis   . Nocturia   . Non-productive cough   . Numbness in both hands AT TIMES  . OSA (obstructive sleep apnea)    "suppose to wear mask; I don't" (04/03/2018)  . PONV (postoperative nausea and vomiting)   . SVT (supraventricular tachycardia) ([place])   . Urge urinary incontinence     HPI:    [name], 64 y.o. female, has a history of pain and functional disability in the right knee due to arthritis and has failed non-surgical conservative treatments for greater than 12 weeks to includecorticosteriod injections, viscosupplementation injections and activity modification.  Onset of symptoms was gradual, starting 3 years ago with gradually worsening course since that time. The patient noted no past surgery on the right knee(s).  Patient currently rates pain in the right knee(s) at 6 out of 10 with activity. Patient has worsening of pain with activity and weight bearing and pain that interferes with activities of daily living.  Patient has evidence of joint space narrowing by imaging studies. There is no active infection.  PCP: Maurice Small, MD   Discharged Condition: good  Hospital Course:  Patient underwent the above stated procedure on 02/10/2020. Patient tolerated the procedure well and brought to the recovery room in good condition and subsequently to the floor.  POD #1 BP: 154/67 ; Pulse: 92 ; Temp: 98 F (36.7 C) ; Resp: 18 Patient reports pain as mild, pain controlled medication.  No events throughout the night.  Dr. Alvan Dame discussed the procedure, findings and expectations moving forward.  Patient is ready be discharged home. Dorsiflexion/plantar flexion intact, incision: dressing C/D/I, no cellulitis present and compartment soft.  LABS  Basename    HGB     11.8  HCT     36.9    Discharge Exam: General appearance: alert, cooperative and no distress Extremities: Homans sign is negative, no sign of DVT, no edema, redness or tenderness in the calves or thighs and no ulcers, gangrene or trophic changes  Disposition: Home with follow up in 2 weeks   Follow-up Information    [facility] Go on 02/13/2020.   Why: You are scheduled for a physical therapy appointment on 02-13-20 at 8:45 am. Contact information: [place]  [id number]        Danae Orleans, PA-C. Go on 02/27/2020.   Specialty: Orthopedic Surgery Why: You are scheduled for a post-operative appointment on 02-27-20 at 11:00 am.  Contact information: [address] [place] [address] [id number]           Discharge Instructions    Call MD / Call 911   Complete by: As directed    If you experience chest pain or shortness of breath, CALL 911 and be transported to the hospital emergency room.  If you develope a fever above 101 F, pus (white drainage) or increased drainage or redness at the wound, or calf pain, call your surgeon's office.   Change dressing   Complete by: As directed    Maintain surgical dressing until follow up in the clinic. If the edges start to pull up, may reinforce with tape. If the dressing is no longer working, may remove and cover with gauze and tape, but must keep the area dry and clean.  Call with any questions or concerns.   Constipation Prevention   Complete by: As directed    Drink plenty of fluids.  Prune juice may be helpful.  You may use a stool softener, such as Colace (over the counter) 100 mg twice a day.  Use MiraLax (over the counter) for constipation as needed.   Diet - low sodium heart healthy   Complete by: As directed    Discharge instructions   Complete by: As directed    Maintain surgical dressing until follow up in the clinic. If the edges start to pull up, may reinforce with tape. If the dressing is no longer working, may remove and cover with gauze and tape, but must keep the area dry and clean.  Follow [facility] in 2 weeks at [facility]. Call with any questions or concerns.   Increase activity slowly as tolerated   Complete by: As directed    Weight bearing as tolerated with assist device (walker, cane, etc) as directed, use it as long as suggested by your surgeon or therapist, typically at least 4-6 weeks.   TED hose   Complete by: As directed    Use stockings (TED hose)  for 2 weeks on both leg(s).  You may remove them at night for sleeping.      Allergies as of 02/11/2020      Reactions   Midazolam Hcl Other (See Comments)   HYPER   Prednisone Other (See Comments)   HYPER, depressed, moody   Statins Other (See Comments)   Causes muscle weakness and joint pain   Ace Inhibitors Other (See Comments)   DECREASES HEARTRATE   Amoxicillin Diarrhea   Did it involve swelling of the face/tongue/throat, SOB, or low BP? No Did it involve sudden or severe rash/hives, skin peeling, or any reaction on the inside of your mouth or  nose? No Did you need to seek medical attention at a hospital or doctor's office? No When did it last happen?~2019 or so If all above answers are "NO", may proceed with cephalosporin use.   Nsaids Other (See Comments)   AVOIDS; HX GASTRIC ULCER   Sulfa Antibiotics Rash, Other (See Comments)   JOINT PAIN      Medication List    STOP taking these medications   acetaminophen 650 MG CR tablet Commonly known as: TYLENOL Replaced by: acetaminophen 500 MG tablet   atorvastatin 20 MG tablet Commonly known as: LIPITOR   oxyCODONE-acetaminophen 5-325 MG tablet Commonly known as: PERCOCET/ROXICET     TAKE these medications   acetaminophen 500 MG tablet Commonly known as: TYLENOL Take 2 tablets (1,000 mg total) by mouth every 8 (eight) hours. Replaces: acetaminophen 650 MG CR tablet   albuterol 108 (90 Base) MCG/ACT inhaler Commonly known as: VENTOLIN HFA Inhale 2 puffs into the lungs every 6 (six) hours as needed for wheezing or shortness of breath.   aspirin 81 MG chewable tablet Commonly known as: Aspirin Childrens Chew 1 tablet (81 mg total) by mouth 2 (two) times daily. Take for 4 weeks, then resume regular dose.   buPROPion 300 MG 24 hr tablet Commonly known as: WELLBUTRIN XL Take 300 mg by mouth daily.   busPIRone 10 MG tablet Commonly known as: BUSPAR Take 10 mg by mouth 2 (two) times daily.   clonazePAM 1 MG  tablet Commonly known as: KLONOPIN Take 1 tablet (1 mg total) by mouth at bedtime. What changed: when to take this   diltiazem 120 MG 24 hr capsule Commonly known as: CARDIZEM CD Take 1 capsule (120 mg total) by mouth daily.   DULoxetine 60 MG capsule Commonly known as: CYMBALTA Take 60 mg by mouth at bedtime.   ferrous sulfate 325 (65 FE) MG tablet Commonly known as: FerrouSul Take 1 tablet (325 mg total) by mouth 3 (three) times daily with meals for 14 days. What changed: when to take this   gabapentin 100 MG capsule Commonly known as: NEURONTIN Take 1 capsule (100 mg total) by mouth at bedtime. What changed: how much to take   hydrALAZINE 25 MG tablet Commonly known as: APRESOLINE Take 25 mg by mouth 2 (two) times daily.   hydrochlorothiazide 25 MG tablet Commonly known as: HYDRODIURIL TAKE 1 TABLET BY MOUTH EVERY DAY   metoprolol tartrate 25 MG tablet Commonly known as: LOPRESSOR Take 0.5 tablets (12.5 mg total) by mouth 2 (two) times daily.   nitroGLYCERIN 0.4 MG SL tablet Commonly known as: NITROSTAT Place 1 tablet (0.4 mg total) under the tongue every 5 (five) minutes x 3 doses as needed for chest pain.   oxyCODONE 5 MG immediate release tablet Commonly known as: Oxy IR/ROXICODONE Take 1-2 tablets (5-10 mg total) by mouth every 4 (four) hours as needed for moderate pain or severe pain.   pantoprazole 40 MG tablet Commonly known as: PROTONIX Take 1 tablet (40 mg total) by mouth daily as needed. What changed: additional instructions   polyethylene glycol 17 g packet Commonly known as: MIRALAX / GLYCOLAX Take 17 g by mouth 2 (two) times daily.   tiZANidine 4 MG tablet Commonly known as: ZANAFLEX Take 1 tablet (4 mg total) by mouth 3 (three) times daily as needed for muscle spasms.   Vitamin D (Ergocalciferol) 1.25 MG (50000 UNIT) Caps capsule Commonly known as: DRISDOL Take 50,000 Units by mouth every Wednesday.  Discharge Care  Instructions  (From admission, onward)         Start     Ordered   02/10/20 0000  Change dressing    Comments: Maintain surgical dressing until follow up in the clinic. If the edges start to pull up, may reinforce with tape. If the dressing is no longer working, may remove and cover with gauze and tape, but must keep the area dry and clean.  Call with any questions or concerns.   02/10/20 [id number]           Signed: [place]. Daveon Arpino   PA-C  02/12/2020, 9:03 AM

## 2020-02-13 DIAGNOSIS — M25561 Pain in right knee: Secondary | ICD-10-CM | POA: Diagnosis not present

## 2020-02-16 ENCOUNTER — Emergency Department (HOSPITAL_COMMUNITY): Payer: Medicare Other

## 2020-02-16 ENCOUNTER — Encounter (HOSPITAL_COMMUNITY): Payer: Self-pay | Admitting: Emergency Medicine

## 2020-02-16 ENCOUNTER — Inpatient Hospital Stay (HOSPITAL_COMMUNITY)
Admission: EM | Admit: 2020-02-16 | Discharge: 2020-02-20 | DRG: 392 | Disposition: A | Discharge status: Home / Self Care | Payer: Medicare Other | Attending: Internal Medicine | Admitting: Internal Medicine

## 2020-02-16 ENCOUNTER — Other Ambulatory Visit: Payer: Self-pay

## 2020-02-16 DIAGNOSIS — D72829 Elevated white blood cell count, unspecified: Secondary | ICD-10-CM | POA: Diagnosis present

## 2020-02-16 DIAGNOSIS — Z6833 Body mass index (BMI) 33.0-33.9, adult: Secondary | ICD-10-CM

## 2020-02-16 DIAGNOSIS — G4733 Obstructive sleep apnea (adult) (pediatric): Secondary | ICD-10-CM

## 2020-02-16 DIAGNOSIS — R112 Nausea with vomiting, unspecified: Secondary | ICD-10-CM | POA: Diagnosis not present

## 2020-02-16 DIAGNOSIS — E876 Hypokalemia: Secondary | ICD-10-CM | POA: Diagnosis not present

## 2020-02-16 DIAGNOSIS — E7800 Pure hypercholesterolemia, unspecified: Secondary | ICD-10-CM | POA: Diagnosis present

## 2020-02-16 DIAGNOSIS — A09 Infectious gastroenteritis and colitis, unspecified: Principal | ICD-10-CM | POA: Diagnosis present

## 2020-02-16 DIAGNOSIS — Z20822 Contact with and (suspected) exposure to covid-19: Secondary | ICD-10-CM | POA: Diagnosis not present

## 2020-02-16 DIAGNOSIS — M797 Fibromyalgia: Secondary | ICD-10-CM | POA: Diagnosis present

## 2020-02-16 DIAGNOSIS — J449 Chronic obstructive pulmonary disease, unspecified: Secondary | ICD-10-CM | POA: Diagnosis present

## 2020-02-16 DIAGNOSIS — E785 Hyperlipidemia, unspecified: Secondary | ICD-10-CM | POA: Diagnosis present

## 2020-02-16 DIAGNOSIS — K529 Noninfective gastroenteritis and colitis, unspecified: Secondary | ICD-10-CM | POA: Diagnosis not present

## 2020-02-16 DIAGNOSIS — I10 Essential (primary) hypertension: Secondary | ICD-10-CM | POA: Diagnosis present

## 2020-02-16 DIAGNOSIS — I2510 Atherosclerotic heart disease of native coronary artery without angina pectoris: Secondary | ICD-10-CM | POA: Diagnosis present

## 2020-02-16 DIAGNOSIS — R739 Hyperglycemia, unspecified: Secondary | ICD-10-CM | POA: Diagnosis present

## 2020-02-16 DIAGNOSIS — F17210 Nicotine dependence, cigarettes, uncomplicated: Secondary | ICD-10-CM | POA: Diagnosis present

## 2020-02-16 DIAGNOSIS — E669 Obesity, unspecified: Secondary | ICD-10-CM | POA: Diagnosis present

## 2020-02-16 DIAGNOSIS — F4000 Agoraphobia, unspecified: Secondary | ICD-10-CM | POA: Diagnosis present

## 2020-02-16 DIAGNOSIS — Z7982 Long term (current) use of aspirin: Secondary | ICD-10-CM

## 2020-02-16 DIAGNOSIS — K769 Liver disease, unspecified: Secondary | ICD-10-CM | POA: Diagnosis present

## 2020-02-16 DIAGNOSIS — G44209 Tension-type headache, unspecified, not intractable: Secondary | ICD-10-CM | POA: Diagnosis not present

## 2020-02-16 DIAGNOSIS — R7303 Prediabetes: Secondary | ICD-10-CM | POA: Diagnosis present

## 2020-02-16 DIAGNOSIS — K219 Gastro-esophageal reflux disease without esophagitis: Secondary | ICD-10-CM | POA: Diagnosis present

## 2020-02-16 DIAGNOSIS — Z96651 Presence of right artificial knee joint: Secondary | ICD-10-CM

## 2020-02-16 DIAGNOSIS — R1110 Vomiting, unspecified: Secondary | ICD-10-CM | POA: Diagnosis not present

## 2020-02-16 DIAGNOSIS — Z79899 Other long term (current) drug therapy: Secondary | ICD-10-CM

## 2020-02-16 DIAGNOSIS — G8929 Other chronic pain: Secondary | ICD-10-CM | POA: Diagnosis present

## 2020-02-16 LAB — CBC
HCT: 38.4 % (ref 36.0–46.0)
Hemoglobin: 12.3 g/dL (ref 12.0–15.0)
MCH: 30.2 pg (ref 26.0–34.0)
MCHC: 32 g/dL (ref 30.0–36.0)
MCV: 94.3 fL (ref 80.0–100.0)
Platelets: 401 10*3/uL — ABNORMAL HIGH (ref 150–400)
RBC: 4.07 MIL/uL (ref 3.87–5.11)
RDW: 14.6 % (ref 11.5–15.5)
WBC: 29.9 10*3/uL — ABNORMAL HIGH (ref 4.0–10.5)
nRBC: 0 % (ref 0.0–0.2)

## 2020-02-16 LAB — COMPREHENSIVE METABOLIC PANEL
ALT: 32 U/L (ref 0–44)
AST: 35 U/L (ref 15–41)
Albumin: 3.9 g/dL (ref 3.5–5.0)
Alkaline Phosphatase: 98 U/L (ref 38–126)
Anion gap: 11 (ref 5–15)
BUN: 27 mg/dL — ABNORMAL HIGH (ref 8–23)
CO2: 34 mmol/L — ABNORMAL HIGH (ref 22–32)
Calcium: 8.9 mg/dL (ref 8.9–10.3)
Chloride: 91 mmol/L — ABNORMAL LOW (ref 98–111)
Creatinine, Ser: 1.12 mg/dL — ABNORMAL HIGH (ref 0.44–1.00)
GFR calc Af Amer: 59 mL/min — ABNORMAL LOW (ref >60)
GFR calc non Af Amer: 51 mL/min — ABNORMAL LOW (ref >60)
Glucose, Bld: 125 mg/dL — ABNORMAL HIGH (ref 70–99)
Potassium: 2.7 mmol/L — CL (ref 3.5–5.1)
Sodium: 136 mmol/L (ref 135–145)
Total Bilirubin: 0.8 mg/dL (ref 0.3–1.2)
Total Protein: 7.1 g/dL (ref 6.5–8.1)

## 2020-02-16 LAB — TYPE AND SCREEN
ABO/RH(D): A NEG
Antibody Screen: NEGATIVE

## 2020-02-16 LAB — LIPASE, BLOOD: Lipase: 31 U/L (ref 11–51)

## 2020-02-16 MED ORDER — SODIUM CHLORIDE 0.9 % IV BOLUS
1000.0000 mL | Freq: Once | INTRAVENOUS | Status: AC
  Administered 2020-02-16: 1000 mL via INTRAVENOUS

## 2020-02-16 MED ORDER — POTASSIUM CHLORIDE 10 MEQ/100ML IV SOLN
10.0000 meq | INTRAVENOUS | Status: AC
  Administered 2020-02-16 – 2020-02-17 (×2): 10 meq via INTRAVENOUS
  Filled 2020-02-16 (×2): qty 100

## 2020-02-16 MED ORDER — IOHEXOL 300 MG/ML  SOLN
100.0000 mL | Freq: Once | INTRAMUSCULAR | Status: AC | PRN
  Administered 2020-02-17: 100 mL via INTRAVENOUS

## 2020-02-16 MED ORDER — SODIUM CHLORIDE (PF) 0.9 % IJ SOLN
INTRAMUSCULAR | Status: AC
  Filled 2020-02-16: qty 50

## 2020-02-16 MED ORDER — ONDANSETRON HCL 4 MG/2ML IJ SOLN
4.0000 mg | Freq: Once | INTRAMUSCULAR | Status: AC
  Administered 2020-02-16: 4 mg via INTRAVENOUS
  Filled 2020-02-16: qty 2

## 2020-02-16 MED ORDER — PANTOPRAZOLE SODIUM 40 MG IV SOLR
40.0000 mg | Freq: Once | INTRAVENOUS | Status: AC
  Administered 2020-02-16: 23:00:00 40 mg via INTRAVENOUS
  Filled 2020-02-16: qty 40

## 2020-02-16 NOTE — ED Provider Notes (Signed)
[facility] DEPT Provider Note   CSN: [id number] Arrival date & time: 02/16/20  2116     History Chief Complaint  Patient presents with  . Abdominal Pain  . Nausea  . Emesis  . Melena    NESREEN PHILIPPS is a 64 y.o. female presenting for evaluation of abdominal pain, nausea, vomiting, and dark tarry stools.  Patient states she had surgery 6 days ago on her knee.  She had several days of constipation, but started passing stool 3 days ago.  Patient states since then, she has been having nausea, vomiting, generalized abdominal pain which feels gassy, and dark tarry stools.  She states she has a history of PUD, takes Protonix intermittently, but none recently.  She states she has been feeling very tired and weak.  She denies fevers, chills, chest pain, shortness of breath, cough, or urinary symptoms.  She is not on blood thinners, although was started on aspirin postop.  She was told to discontinue it tonight.  She has a history of an open gallbladder surgery, no other abdominal surgeries.  Additional history obtained from chart review.  Patient with a history of agoraphobia, anxiety, bronchitis, COPD, CAD, GERD, fibromyalgia, hypertension, hyperlipidemia, IBS, leukocytosis.  She follows with [facility].  HPI     Past Medical History:  Diagnosis Date  . Agoraphobia without history of panic disorder   . Anginal pain ([place])   . Anxiety   . Arthritis    "qwhere" (04/03/2018)  . Childhood asthma   . Chronic bronchitis ([place])   . Chronic lower back pain   . COPD (chronic obstructive pulmonary disease) ([place])   . Coronary artery disease CARDIOLOGIST- DR  Doylene Canard  (VISIT 03-30-11 W/ CHART)   NON-OBS. CAD   (STRESS TEST NOV. 2011  . Daily headache   . DDD (degenerative disc disease)   . Depression   . DJD (degenerative joint disease)    JOINT PAIN  . Dyspnea    "anytime but mostly on exertion and smoking"  . Elevated liver enzymes   . Fibromyalgia   .  Frequency of urination   . GERD (gastroesophageal reflux disease) AND HIATIAL HERNIA   CONTROLLED W/ NEXIUM  . Heart murmur   . Hemorrhoids   . High cholesterol   . History of blood transfusion    "related to anemia" (04/03/2018)  . History of carpal tunnel syndrome    Right  . History of gastric ulcer 2004  . History of hiatal hernia   . Hypertension   . IBS (irritable bowel syndrome)   . Incisional pain s/p interstim implant 1st stage--- 12-07-11    left upper buttock-- pt states dressing clean dry and intact (on 12-08-11)  . Insomnia   . Iron deficiency anemia   . Leukocytosis   . Nocturia   . Non-productive cough   . Numbness in both hands AT TIMES  . OSA (obstructive sleep apnea)    "suppose to wear mask; I don't" (04/03/2018)  . PONV (postoperative nausea and vomiting)   . SVT (supraventricular tachycardia) ([place])   . Urge urinary incontinence     Patient Active Problem List   Diagnosis Date Noted  . Colitis presumed infectious 02/17/2020  . Liver lesion 02/17/2020  . S/P right TKA 02/10/2020  . Low back pain 11/27/2018  . Acute coronary syndrome ([place]) 04/02/2018  . Hyperlipidemia 08/06/2017  . Influenza A 01/01/2017  . COPD exacerbation ([place]) 01/01/2017  . Acute renal failure (ARF) ([place]) 01/01/2017  .  Dehydration 01/01/2017  . Unstable angina ([place]) 10/14/2016  . Chest pain 08/22/2016  . SVT (supraventricular tachycardia) ([place]) 08/22/2016  . OSA on CPAP   . Hypertension   . GERD (gastroesophageal reflux disease)   . Depression   . Coronary artery disease   . Anxiety   . Pain in the chest   . Hypokalemia   . Carpal tunnel syndrome, bilateral 02/18/2016  . Leukocytosis 05/26/2014  . Tobacco abuse 05/26/2014  . Urge incontinence 12/14/2011    Past Surgical History:  Procedure Laterality Date  . ANTERIOR CERVICAL DECOMP/DISCECTOMY FUSION  2008   C4 - T1 ("screws from 1st OR came out")  . ANTERIOR CERVICAL DECOMP/DISCECTOMY FUSION  2002   C4 - 7  .  ANTERIOR LATERAL LUMBAR FUSION WITH PERCUTANEOUS SCREW 2 LEVEL Left 11/27/2018   Procedure: XLIF Lumbar 3-5, posterior spinal fusion L3-5;  Surgeon: Melina Schools, MD;  Location: [facility];  Service: Orthopedics;  Laterality: Left;  5.5 hrs for entire procedure  . APPENDECTOMY  1982  . BACK SURGERY    . CARDIAC CATHETERIZATION  2003  . CARDIAC CATHETERIZATION N/A 08/23/2016   Procedure: Left Heart Cath and Coronary Angiography;  Surgeon: Dixie Dials, MD;  Location: [facility];  Service: Cardiovascular;  Laterality: N/A;  . CARPAL TUNNEL RELEASE Right   . CATARACT EXTRACTION W/ INTRAOCULAR LENS  IMPLANT, BILATERAL  2016-2018  . CHOLECYSTECTOMY OPEN  1982  . COLONOSCOPY    . CYSTO/ HOD/ BLADDER BX  2006  . CYSTO/ HOD/ BLADDER BX/ FULGERATION  06-12-2011  . FINGER SURGERY Right 2001   "replaced worn cartilage on thumb w/tendons"  . HYSTEROSCOPY WITH D & C  2005  . INTERSTIM IMPLANT PLACEMENT  12/07/2011   Procedure: Barrie Lyme IMPLANT FIRST STAGE;  Surgeon: [name], MD;  Location: [facility];  Service: Urology;  Laterality: Right;  . INTERSTIM IMPLANT PLACEMENT  12/14/2011   Procedure: Barrie Lyme IMPLANT SECOND STAGE;  Surgeon: [name], MD;  Location: [facility];  Service: Urology;  Laterality: Right;  rad tech ok by vickie at [facility]  . LUMBAR LAMINECTOMY  2007   L3 - 5  . TONSILLECTOMY AND ADENOIDECTOMY  1965  . TOTAL KNEE ARTHROPLASTY Right 02/10/2020   Procedure: TOTAL KNEE ARTHROPLASTY;  Surgeon: Paralee Cancel, MD;  Location: [facility];  Service: Orthopedics;  Laterality: Right;  70 mins  . TUBAL LIGATION    . UPPER GI ENDOSCOPY    . WRIST SURGERY Left    TFCC ("tendon repair")     OB History   No obstetric history on file.     Family History  Problem Relation Age of Onset  . Hypertension Father   . Heart disease Father   . Kidney failure Father   . Breast cancer Mother   . Hypertension Mother   . Coronary artery disease  Mother   . Alzheimer's disease Mother   . Hypertension Brother   . Hypertension Sister   . Hypertension Daughter     Social History   Tobacco Use  . Smoking status: Current Every Day Smoker    Packs/day: 2.00    Years: 43.00    Pack years: 86.00    Types: Cigarettes  . Smokeless tobacco: Never Used  Substance Use Topics  . Alcohol use: Not Currently    Alcohol/week: 0.0 standard drinks    Comment: 04/03/2018 "nothing in years"  . Drug use: Not Currently    Types: Marijuana    Home  Medications Prior to Admission medications   Medication Sig Start Date End Date Taking? Authorizing Provider  acetaminophen (TYLENOL) 500 MG tablet Take 2 tablets (1,000 mg total) by mouth every 8 (eight) hours. 02/10/20  Yes Babish, Rodman Key, PA-C  albuterol (PROVENTIL HFA;VENTOLIN HFA) 108 (90 BASE) MCG/ACT inhaler Inhale 2 puffs into the lungs every 6 (six) hours as needed for wheezing or shortness of breath.   Yes [provider]  aspirin (ASPIRIN CHILDRENS) 81 MG chewable tablet Chew 1 tablet (81 mg total) by mouth 2 (two) times daily. Take for 4 weeks, then resume regular dose. 02/11/20 03/12/20 Yes Babish, Rodman Key, PA-C  buPROPion (WELLBUTRIN XL) 300 MG 24 hr tablet Take 300 mg by mouth daily.   Yes [provider]  busPIRone (BUSPAR) 10 MG tablet Take 10 mg by mouth 2 (two) times daily.   Yes [provider]  clonazePAM (KLONOPIN) 1 MG tablet Take 1 tablet (1 mg total) by mouth at bedtime. Patient taking differently: Take 1 mg by mouth 2 (two) times daily.  11/29/18  Yes Melina Schools, MD  diltiazem (CARDIZEM CD) 120 MG 24 hr capsule Take 1 capsule (120 mg total) by mouth daily. 07/24/19  Yes Camnitz, Will Hassell Done, MD  DULoxetine (CYMBALTA) 60 MG capsule Take 60 mg by mouth at bedtime.    Yes [provider]  ferrous sulfate (FERROUSUL) 325 (65 FE) MG tablet Take 1 tablet (325 mg total) by mouth 3 (three) times daily with meals for 14 days. 02/10/20 02/24/20 Yes Babish,  Rodman Key, PA-C  gabapentin (NEURONTIN) 100 MG capsule Take 1 capsule (100 mg total) by mouth at bedtime. Patient taking differently: Take 200 mg by mouth at bedtime.  11/29/18  Yes Melina Schools, MD  hydrALAZINE (APRESOLINE) 25 MG tablet Take 25 mg by mouth 2 (two) times daily.    Yes [provider]  hydrochlorothiazide (HYDRODIURIL) 25 MG tablet TAKE 1 TABLET BY MOUTH EVERY DAY Patient taking differently: Take 25 mg by mouth daily.  05/07/19  Yes Camnitz, Ocie Doyne, MD  metoprolol tartrate (LOPRESSOR) 25 MG tablet Take 0.5 tablets (12.5 mg total) by mouth 2 (two) times daily. 07/04/19  Yes Dixie Dials, MD  nitroGLYCERIN (NITROSTAT) 0.4 MG SL tablet Place 1 tablet (0.4 mg total) under the tongue every 5 (five) minutes x 3 doses as needed for chest pain. 08/24/16  Yes Eugenie Filler, MD  oxyCODONE (OXY IR/ROXICODONE) 5 MG immediate release tablet Take 1-2 tablets (5-10 mg total) by mouth every 4 (four) hours as needed for moderate pain or severe pain. 02/10/20  Yes Babish, Rodman Key, PA-C  pantoprazole (PROTONIX) 40 MG tablet Take 1 tablet (40 mg total) by mouth daily as needed. Patient taking differently: Take 40 mg by mouth daily as needed. Heart Burn 08/24/16  Yes Eugenie Filler, MD  polyethylene glycol (MIRALAX / GLYCOLAX) 17 g packet Take 17 g by mouth 2 (two) times daily. 02/10/20  Yes Babish, Rodman Key, PA-C  tiZANidine (ZANAFLEX) 4 MG tablet Take 1 tablet (4 mg total) by mouth 3 (three) times daily as needed for muscle spasms. 02/10/20  Yes Babish, Rodman Key, PA-C  Vitamin D, Ergocalciferol, (DRISDOL) 1.25 MG (50000 UNIT) CAPS capsule Take 50,000 Units by mouth every Wednesday. 12/03/19  Yes [provider]    Allergies    Midazolam hcl, Prednisone, Statins, Ace inhibitors, Amoxicillin, Nsaids, and Sulfa antibiotics  Review of Systems   Review of Systems  Gastrointestinal: Positive for abdominal pain, nausea and vomiting.       Cherylynn Ridges  stools  Neurological: Positive for  weakness.  All other systems reviewed and are negative.   Physical Exam Updated Vital Signs BP (!) 152/66   Pulse 74   Temp 98 F (36.7 C) (Oral)   Resp 20   LMP 09/06/2005   SpO2 95%   Physical Exam Vitals and nursing note reviewed. Exam conducted with a chaperone present.  Constitutional:      General: She is not in acute distress.    Appearance: She is well-developed.     Comments: Appears uncomfortable  HENT:     Head: Normocephalic and atraumatic.  Eyes:     Conjunctiva/sclera: Conjunctivae normal.     Pupils: Pupils are equal, round, and reactive to light.  Cardiovascular:     Rate and Rhythm: Normal rate and regular rhythm.     Pulses: Normal pulses.  Pulmonary:     Effort: Pulmonary effort is normal. No respiratory distress.     Breath sounds: Normal breath sounds. No wheezing.  Abdominal:     General: Bowel sounds are normal. There is no distension.     Palpations: Abdomen is soft. There is no mass.     Tenderness: There is abdominal tenderness. There is no guarding or rebound.     Comments: Mildly obese abdomen.  Large horizontal surgical scar on the right upper quadrant.  No rigidity, guarding, distention.  Patient reports tenderness palpation of the generalized abdomen.  Genitourinary:    Rectum: Normal.     Comments: No gross blood on rectal. No tenderness of obvious impaction Musculoskeletal:        General: Swelling and tenderness present.     Cervical back: Normal range of motion and neck supple.     Comments: Mild swelling of the right knee.  Incision is well-healing without signs of infection or purulent drainage.  Skin:    General: Skin is warm and dry.     Capillary Refill: Capillary refill takes less than 2 seconds.  Neurological:     Mental Status: She is alert and oriented to person, place, and time.     ED Results / Procedures / Treatments   Labs (all labs ordered are listed, but only abnormal results are displayed) Labs Reviewed   COMPREHENSIVE METABOLIC PANEL - Abnormal; Notable for the following components:      Result Value   Potassium 2.7 (*)    Chloride 91 (*)    CO2 34 (*)    Glucose, Bld 125 (*)    BUN 27 (*)    Creatinine, Ser 1.12 (*)    GFR calc non Af Amer 51 (*)    GFR calc Af Amer 59 (*)    All other components within normal limits  CBC - Abnormal; Notable for the following components:   WBC 29.9 (*)    Platelets 401 (*)    All other components within normal limits  SARS CORONAVIRUS 2 (TAT 6-24 HRS)  GI PATHOGEN PANEL BY PCR, STOOL  LIPASE, BLOOD  BASIC METABOLIC PANEL  CBC WITH DIFFERENTIAL/PLATELET  PATHOLOGIST SMEAR REVIEW  POC OCCULT BLOOD, ED  TYPE AND SCREEN    EKG EKG Interpretation  Date/Time:  Monday February 16 2020 22:41:50 EST Ventricular Rate:  68 PR Interval:    QRS Duration: 102 QT Interval:  452 QTC Calculation: 481 R Axis:   78 Text Interpretation: Sinus rhythm Anterior infarct, old Abnormal T, consider ischemia, lateral leads Baseline wander in lead(s) V6 Similar to Aug 2020 tracing. No STEMI Confirmed by Long,  Vonna Kotyk [id number]) on 02/16/2020 10:56:30 PM   Radiology CT ABDOMEN PELVIS W CONTRAST  Result Date: 02/17/2020 CLINICAL DATA:  Nausea vomiting black stools EXAM: CT ABDOMEN AND PELVIS WITH CONTRAST TECHNIQUE: Multidetector CT imaging of the abdomen and pelvis was performed using the standard protocol following bolus administration of intravenous contrast. CONTRAST:  17mL OMNIPAQUE IOHEXOL 300 MG/ML  SOLN COMPARISON:  None. FINDINGS: Lower chest: The visualized heart size within normal limits. No pericardial fluid/thickening. No hiatal hernia. The visualized portions of the lungs are clear. Hepatobiliary: Slightly low-density liver parenchyma. In the posterior right liver lobe there is a 1.6 cm hyperdense lesion which appears to fill-in further with contrast on delayed images. Main portal vein is patent. The patient is status post cholecystectomy. No biliary ductal  dilation. Pancreas: Unremarkable. No pancreatic ductal dilatation or surrounding inflammatory changes. Spleen: Normal in size without focal abnormality. Adrenals/Urinary Tract: Both adrenal glands appear normal. The kidneys and collecting system appear normal without evidence of urinary tract calculus or hydronephrosis. Prominent right extrarenal pelvis is seen. Bladder is unremarkable. Stomach/Bowel: The stomach, small bowel, are normal in appearance. There is question of mild wall edema involving the cecum and terminal ileum. There is question of minimal surrounding fat stranding changes seen at the terminal ileum. Vascular/Lymphatic: There are no enlarged mesenteric, retroperitoneal, or pelvic lymph nodes. Scattered aortic atherosclerotic calcifications are seen without aneurysmal dilatation. Reproductive: The uterus and adnexa are unremarkable. Other: Small fat containing inguinal hernias are noted. Musculoskeletal: No acute or significant osseous findings. Again noted is prior lumbar spine fixation from L3 through L5 with interbody fusion. IMPRESSION: 1. Edematous appearance to the cecum and terminal ileum. Findings may be infectious, inflammatory, or secondary to hypoalbuminemia. 2. 1.6 cm posterior right liver lobe hyperdense lesion which is nonspecific, and could represent a hemangioma. If further evaluation is required would recommend MRI with contrast. 3.  Aortic Atherosclerosis (ICD10-I70.0). Electronically Signed   By: Prudencio Pair M.D.   On: 02/17/2020 00:26    Procedures Procedures (including critical care time)  Medications Ordered in ED Medications  potassium chloride 10 mEq in 100 mL IVPB (10 mEq Intravenous New Bag/Given 02/17/20 0039)  sodium chloride (PF) 0.9 % injection (has no administration in time range)  ciprofloxacin (CIPRO) IVPB 400 mg (400 mg Intravenous New Bag/Given 02/17/20 0126)  metroNIDAZOLE (FLAGYL) IVPB 500 mg (has no administration in time range)  potassium chloride SA  (KLOR-CON) CR tablet 40 mEq (has no administration in time range)  oxyCODONE (Oxy IR/ROXICODONE) immediate release tablet 5-10 mg (has no administration in time range)  metoprolol tartrate (LOPRESSOR) tablet 12.5 mg (has no administration in time range)  pantoprazole (PROTONIX) EC tablet 40 mg (has no administration in time range)  tiZANidine (ZANAFLEX) tablet 4 mg (has no administration in time range)  clonazePAM (KLONOPIN) tablet 1 mg (has no administration in time range)  diltiazem (CARDIZEM CD) 24 hr capsule 120 mg (has no administration in time range)  aspirin chewable tablet 81 mg (has no administration in time range)  buPROPion (WELLBUTRIN XL) 24 hr tablet 300 mg (has no administration in time range)  busPIRone (BUSPAR) tablet 10 mg (has no administration in time range)  albuterol (VENTOLIN HFA) 108 (90 Base) MCG/ACT inhaler 2 puff (has no administration in time range)  hydrALAZINE (APRESOLINE) tablet 25 mg (has no administration in time range)  gabapentin (NEURONTIN) capsule 200 mg (has no administration in time range)  ferrous sulfate tablet 325 mg (has no administration in time range)  DULoxetine (CYMBALTA) DR capsule  60 mg (has no administration in time range)  acetaminophen (TYLENOL) tablet 650 mg (has no administration in time range)    Or  acetaminophen (TYLENOL) suppository 650 mg (has no administration in time range)  ondansetron (ZOFRAN) tablet 4 mg (has no administration in time range)    Or  ondansetron (ZOFRAN) injection 4 mg (has no administration in time range)  enoxaparin (LOVENOX) injection 40 mg (has no administration in time range)  cefTRIAXone (ROCEPHIN) 2 g in sodium chloride 0.9 % 100 mL IVPB (has no administration in time range)  metroNIDAZOLE (FLAGYL) IVPB 500 mg (has no administration in time range)  polyethylene glycol (MIRALAX / GLYCOLAX) packet 17 g (has no administration in time range)  lactated ringers infusion (has no administration in time range)   pantoprazole (PROTONIX) injection 40 mg (40 mg Intravenous Given 02/16/20 2243)  ondansetron (ZOFRAN) injection 4 mg (4 mg Intravenous Given 02/16/20 2240)  sodium chloride 0.9 % bolus 1,000 mL (0 mLs Intravenous Stopped 02/17/20 0029)  iohexol (OMNIPAQUE) 300 MG/ML solution 100 mL (100 mLs Intravenous Contrast Given 02/17/20 0000)    ED Course  I have reviewed the triage vital signs and the nursing notes.  Pertinent labs & imaging results that were available during my care of the patient were reviewed by me and considered in my medical decision making (see chart for details).    MDM Rules/Calculators/A&P                      Patient presenting for evaluation nausea, vomiting, abdominal pain, tarry stools.  Physical exam shows patient who appears uncomfortable, otherwise nontoxic.  Generalized abdominal tenderness.  She does appear mildly pale.  Concern for GI bleed, potentially causing anemia.  Concern for postop ileus.  Also consider pancreatitis.  Bowel obstruction less likely due to patient having bowel movements.  Will obtain labs and reassess.  Rectal exam without gross blood.  Labs interpreted by me, leukocytosis at 29.9.  Patient has chronic leukocytosis, although this is slightly elevated from previous.  This could be due to recent surgery as well.  Hypokalemia 2.7, likely due to frequent vomiting.  Currently mildly elevated, likely due to dehydration.  Lipase is normal.  Will obtain CT abdomen pelvis.  Case discussed with attending, Dr. Laverta Baltimore evaluated the patient.  CT shows inflammation of the cecum and terminal ileum, question infection.  In the setting of leukocytosis, worsening symptoms, and tarry stools, concern for infection.  Will start antibiotics.  Patient also hypokalemic due to her vomiting.  Considering her age, recent surgery, and symptoms today, I am concerned about her ability to go home safely.  Will call for admission.  Discussed with Dr. Fabio Neighbors from [facility] hospitalist  service, patient to be admitted.  Final Clinical Impression(s) / ED Diagnoses Final diagnoses:  Colitis  Hypokalemia    Rx / DC Orders ED Discharge Orders    None       Franchot Heidelberg, PA-C 02/17/20 0129    Long, Wonda Olds, MD 02/17/20 [phone]

## 2020-02-16 NOTE — ED Notes (Signed)
Date and time results received: 02/16/20 2311 (use smartphrase ".now" to insert current time)  Test: Potassium Critical Value: 2.7  Name of Provider Notified: Sophia, [place]  Orders Received? Or Actions Taken?: Orders Received - See Orders for details

## 2020-02-16 NOTE — ED Triage Notes (Signed)
Patient here from home with complaints of abd pain, n/v, and black stools that started today. States that he had knee replacement surgery on 2/23.

## 2020-02-17 DIAGNOSIS — E876 Hypokalemia: Secondary | ICD-10-CM

## 2020-02-17 DIAGNOSIS — Z9989 Dependence on other enabling machines and devices: Secondary | ICD-10-CM

## 2020-02-17 DIAGNOSIS — G44209 Tension-type headache, unspecified, not intractable: Secondary | ICD-10-CM | POA: Diagnosis present

## 2020-02-17 DIAGNOSIS — G8929 Other chronic pain: Secondary | ICD-10-CM | POA: Diagnosis present

## 2020-02-17 DIAGNOSIS — K529 Noninfective gastroenteritis and colitis, unspecified: Secondary | ICD-10-CM | POA: Diagnosis not present

## 2020-02-17 DIAGNOSIS — J449 Chronic obstructive pulmonary disease, unspecified: Secondary | ICD-10-CM | POA: Diagnosis present

## 2020-02-17 DIAGNOSIS — D72829 Elevated white blood cell count, unspecified: Secondary | ICD-10-CM

## 2020-02-17 DIAGNOSIS — Z7982 Long term (current) use of aspirin: Secondary | ICD-10-CM | POA: Diagnosis not present

## 2020-02-17 DIAGNOSIS — K219 Gastro-esophageal reflux disease without esophagitis: Secondary | ICD-10-CM | POA: Diagnosis present

## 2020-02-17 DIAGNOSIS — I2510 Atherosclerotic heart disease of native coronary artery without angina pectoris: Secondary | ICD-10-CM | POA: Diagnosis present

## 2020-02-17 DIAGNOSIS — G4733 Obstructive sleep apnea (adult) (pediatric): Secondary | ICD-10-CM | POA: Diagnosis not present

## 2020-02-17 DIAGNOSIS — I10 Essential (primary) hypertension: Secondary | ICD-10-CM | POA: Diagnosis not present

## 2020-02-17 DIAGNOSIS — A09 Infectious gastroenteritis and colitis, unspecified: Secondary | ICD-10-CM | POA: Diagnosis present

## 2020-02-17 DIAGNOSIS — Z6833 Body mass index (BMI) 33.0-33.9, adult: Secondary | ICD-10-CM | POA: Diagnosis not present

## 2020-02-17 DIAGNOSIS — R739 Hyperglycemia, unspecified: Secondary | ICD-10-CM | POA: Diagnosis present

## 2020-02-17 DIAGNOSIS — R112 Nausea with vomiting, unspecified: Secondary | ICD-10-CM | POA: Diagnosis not present

## 2020-02-17 DIAGNOSIS — K769 Liver disease, unspecified: Secondary | ICD-10-CM | POA: Diagnosis not present

## 2020-02-17 DIAGNOSIS — F17210 Nicotine dependence, cigarettes, uncomplicated: Secondary | ICD-10-CM | POA: Diagnosis present

## 2020-02-17 DIAGNOSIS — E7800 Pure hypercholesterolemia, unspecified: Secondary | ICD-10-CM | POA: Diagnosis present

## 2020-02-17 DIAGNOSIS — R1110 Vomiting, unspecified: Secondary | ICD-10-CM | POA: Diagnosis not present

## 2020-02-17 DIAGNOSIS — Z96651 Presence of right artificial knee joint: Secondary | ICD-10-CM | POA: Diagnosis present

## 2020-02-17 DIAGNOSIS — R7303 Prediabetes: Secondary | ICD-10-CM | POA: Diagnosis present

## 2020-02-17 DIAGNOSIS — Z20822 Contact with and (suspected) exposure to covid-19: Secondary | ICD-10-CM | POA: Diagnosis present

## 2020-02-17 DIAGNOSIS — F4000 Agoraphobia, unspecified: Secondary | ICD-10-CM | POA: Diagnosis present

## 2020-02-17 DIAGNOSIS — Z79899 Other long term (current) drug therapy: Secondary | ICD-10-CM | POA: Diagnosis not present

## 2020-02-17 DIAGNOSIS — M797 Fibromyalgia: Secondary | ICD-10-CM | POA: Diagnosis present

## 2020-02-17 DIAGNOSIS — E785 Hyperlipidemia, unspecified: Secondary | ICD-10-CM | POA: Diagnosis present

## 2020-02-17 DIAGNOSIS — E669 Obesity, unspecified: Secondary | ICD-10-CM | POA: Diagnosis present

## 2020-02-17 LAB — BASIC METABOLIC PANEL
Anion gap: 14 (ref 5–15)
BUN: 20 mg/dL (ref 8–23)
CO2: 28 mmol/L (ref 22–32)
Calcium: 8.8 mg/dL — ABNORMAL LOW (ref 8.9–10.3)
Chloride: 93 mmol/L — ABNORMAL LOW (ref 98–111)
Creatinine, Ser: 0.8 mg/dL (ref 0.44–1.00)
GFR calc Af Amer: >60 mL/min (ref >60)
GFR calc non Af Amer: >60 mL/min (ref >60)
Glucose, Bld: 105 mg/dL — ABNORMAL HIGH (ref 70–99)
Potassium: 3.2 mmol/L — ABNORMAL LOW (ref 3.5–5.1)
Sodium: 135 mmol/L (ref 135–145)

## 2020-02-17 LAB — CBC WITH DIFFERENTIAL/PLATELET
Abs Immature Granulocytes: 0.38 10*3/uL — ABNORMAL HIGH (ref 0.00–0.07)
Basophils Absolute: 0.1 10*3/uL (ref 0.0–0.1)
Basophils Relative: 0 %
Eosinophils Absolute: 0.4 10*3/uL (ref 0.0–0.5)
Eosinophils Relative: 2 %
HCT: 36.2 % (ref 36.0–46.0)
Hemoglobin: 11.2 g/dL — ABNORMAL LOW (ref 12.0–15.0)
Immature Granulocytes: 2 %
Lymphocytes Relative: 14 %
Lymphs Abs: 3.3 10*3/uL (ref 0.7–4.0)
MCH: 29.6 pg (ref 26.0–34.0)
MCHC: 30.9 g/dL (ref 30.0–36.0)
MCV: 95.5 fL (ref 80.0–100.0)
Monocytes Absolute: 1.4 10*3/uL — ABNORMAL HIGH (ref 0.1–1.0)
Monocytes Relative: 6 %
Neutro Abs: 18.4 10*3/uL — ABNORMAL HIGH (ref 1.7–7.7)
Neutrophils Relative %: 76 %
Platelets: 249 10*3/uL (ref 150–400)
RBC: 3.79 MIL/uL — ABNORMAL LOW (ref 3.87–5.11)
RDW: 14.7 % (ref 11.5–15.5)
WBC: 24 10*3/uL — ABNORMAL HIGH (ref 4.0–10.5)
nRBC: 0 % (ref 0.0–0.2)

## 2020-02-17 LAB — PATHOLOGIST SMEAR REVIEW

## 2020-02-17 LAB — SARS CORONAVIRUS 2 BY RT PCR (DIASORIN): SARS Coronavirus 2: NEGATIVE

## 2020-02-17 LAB — POC OCCULT BLOOD, ED: Fecal Occult Bld: NEGATIVE

## 2020-02-17 LAB — OCCULT BLOOD, POC DEVICE: Fecal Occult Bld: NEGATIVE

## 2020-02-17 MED ORDER — ACETAMINOPHEN 325 MG PO TABS
650.0000 mg | ORAL_TABLET | Freq: Four times a day (QID) | ORAL | Status: DC | PRN
  Administered 2020-02-19: 650 mg via ORAL
  Filled 2020-02-17: qty 2

## 2020-02-17 MED ORDER — POTASSIUM CHLORIDE CRYS ER 20 MEQ PO TBCR
40.0000 meq | EXTENDED_RELEASE_TABLET | Freq: Once | ORAL | Status: AC
  Administered 2020-02-17: 40 meq via ORAL
  Filled 2020-02-17: qty 2

## 2020-02-17 MED ORDER — BUSPIRONE HCL 5 MG PO TABS
10.0000 mg | ORAL_TABLET | Freq: Two times a day (BID) | ORAL | Status: DC
  Administered 2020-02-17 – 2020-02-20 (×7): 10 mg via ORAL
  Filled 2020-02-17 (×7): qty 2

## 2020-02-17 MED ORDER — ONDANSETRON HCL 4 MG/2ML IJ SOLN
4.0000 mg | Freq: Four times a day (QID) | INTRAMUSCULAR | Status: DC | PRN
  Administered 2020-02-19 (×2): 4 mg via INTRAVENOUS
  Filled 2020-02-17 (×2): qty 2

## 2020-02-17 MED ORDER — ASPIRIN 81 MG PO CHEW
81.0000 mg | CHEWABLE_TABLET | Freq: Two times a day (BID) | ORAL | Status: DC
  Administered 2020-02-17 – 2020-02-20 (×7): 81 mg via ORAL
  Filled 2020-02-17 (×7): qty 1

## 2020-02-17 MED ORDER — DILTIAZEM HCL ER COATED BEADS 120 MG PO CP24
120.0000 mg | ORAL_CAPSULE | Freq: Every day | ORAL | Status: DC
  Administered 2020-02-17 – 2020-02-20 (×4): 120 mg via ORAL
  Filled 2020-02-17 (×4): qty 1

## 2020-02-17 MED ORDER — SODIUM CHLORIDE 0.9 % IV SOLN
2.0000 g | INTRAVENOUS | Status: DC
  Administered 2020-02-17 – 2020-02-20 (×4): 2 g via INTRAVENOUS
  Filled 2020-02-17 (×4): qty 2

## 2020-02-17 MED ORDER — CLONAZEPAM 1 MG PO TABS
1.0000 mg | ORAL_TABLET | Freq: Two times a day (BID) | ORAL | Status: DC
  Administered 2020-02-17 – 2020-02-20 (×8): 1 mg via ORAL
  Filled 2020-02-17 (×4): qty 1
  Filled 2020-02-17: qty 2
  Filled 2020-02-17 (×3): qty 1

## 2020-02-17 MED ORDER — DULOXETINE HCL 60 MG PO CPEP
60.0000 mg | ORAL_CAPSULE | Freq: Every day | ORAL | Status: DC
  Administered 2020-02-17 – 2020-02-19 (×3): 60 mg via ORAL
  Filled 2020-02-17 (×3): qty 1

## 2020-02-17 MED ORDER — METOPROLOL TARTRATE 25 MG PO TABS
12.5000 mg | ORAL_TABLET | Freq: Two times a day (BID) | ORAL | Status: DC
  Administered 2020-02-17 – 2020-02-20 (×6): 12.5 mg via ORAL
  Filled 2020-02-17 (×8): qty 1

## 2020-02-17 MED ORDER — ONDANSETRON HCL 4 MG PO TABS: 4.0000 mg | ORAL_TABLET | Freq: Four times a day (QID) | ORAL | Status: DC | PRN

## 2020-02-17 MED ORDER — POLYETHYLENE GLYCOL 3350 17 G PO PACK
17.0000 g | PACK | Freq: Two times a day (BID) | ORAL | Status: DC
  Administered 2020-02-17 – 2020-02-19 (×3): 17 g via ORAL
  Filled 2020-02-17 (×5): qty 1

## 2020-02-17 MED ORDER — BUPROPION HCL ER (XL) 300 MG PO TB24
300.0000 mg | ORAL_TABLET | Freq: Every day | ORAL | Status: DC
  Administered 2020-02-17 – 2020-02-20 (×4): 300 mg via ORAL
  Filled 2020-02-17 (×4): qty 1

## 2020-02-17 MED ORDER — ENOXAPARIN SODIUM 40 MG/0.4ML ~~LOC~~ SOLN
40.0000 mg | Freq: Every day | SUBCUTANEOUS | Status: DC
  Administered 2020-02-17 – 2020-02-20 (×4): 40 mg via SUBCUTANEOUS
  Filled 2020-02-17 (×4): qty 0.4

## 2020-02-17 MED ORDER — ALBUTEROL SULFATE HFA 108 (90 BASE) MCG/ACT IN AERS: 2.0000 | INHALATION_SPRAY | Freq: Four times a day (QID) | RESPIRATORY_TRACT | Status: DC | PRN

## 2020-02-17 MED ORDER — PANTOPRAZOLE SODIUM 40 MG PO TBEC: 40.0000 mg | DELAYED_RELEASE_TABLET | Freq: Every day | ORAL | Status: DC | PRN

## 2020-02-17 MED ORDER — TIZANIDINE HCL 4 MG PO TABS
4.0000 mg | ORAL_TABLET | Freq: Three times a day (TID) | ORAL | Status: DC | PRN
  Administered 2020-02-18: 4 mg via ORAL
  Filled 2020-02-17: qty 1

## 2020-02-17 MED ORDER — OXYCODONE HCL 5 MG PO TABS
5.0000 mg | ORAL_TABLET | ORAL | Status: DC | PRN
  Administered 2020-02-17: 10 mg via ORAL
  Administered 2020-02-17 (×2): 5 mg via ORAL
  Administered 2020-02-17 – 2020-02-19 (×5): 10 mg via ORAL
  Filled 2020-02-17: qty 2
  Filled 2020-02-17: qty 1
  Filled 2020-02-17 (×3): qty 2
  Filled 2020-02-17: qty 1
  Filled 2020-02-17 (×3): qty 2

## 2020-02-17 MED ORDER — METRONIDAZOLE IN NACL 5-0.79 MG/ML-% IV SOLN
500.0000 mg | Freq: Three times a day (TID) | INTRAVENOUS | Status: DC
  Administered 2020-02-17 – 2020-02-20 (×10): 500 mg via INTRAVENOUS
  Filled 2020-02-17 (×10): qty 100

## 2020-02-17 MED ORDER — HYDROCHLOROTHIAZIDE 25 MG PO TABS: 25.0000 mg | ORAL_TABLET | Freq: Every day | ORAL | Status: DC

## 2020-02-17 MED ORDER — FERROUS SULFATE 325 (65 FE) MG PO TABS
325.0000 mg | ORAL_TABLET | Freq: Three times a day (TID) | ORAL | Status: DC
  Administered 2020-02-17 – 2020-02-20 (×8): 325 mg via ORAL
  Filled 2020-02-17 (×9): qty 1

## 2020-02-17 MED ORDER — LACTATED RINGERS IV SOLN: INTRAVENOUS | Status: DC

## 2020-02-17 MED ORDER — GABAPENTIN 100 MG PO CAPS
200.0000 mg | ORAL_CAPSULE | Freq: Every day | ORAL | Status: DC
  Administered 2020-02-17 – 2020-02-19 (×4): 200 mg via ORAL
  Filled 2020-02-17 (×4): qty 2

## 2020-02-17 MED ORDER — ACETAMINOPHEN 650 MG RE SUPP: 650.0000 mg | Freq: Four times a day (QID) | RECTAL | Status: DC | PRN

## 2020-02-17 MED ORDER — METRONIDAZOLE IN NACL 5-0.79 MG/ML-% IV SOLN
500.0000 mg | Freq: Once | INTRAVENOUS | Status: AC
  Administered 2020-02-17: 500 mg via INTRAVENOUS
  Filled 2020-02-17: qty 100

## 2020-02-17 MED ORDER — HYDRALAZINE HCL 25 MG PO TABS
25.0000 mg | ORAL_TABLET | Freq: Two times a day (BID) | ORAL | Status: DC
  Administered 2020-02-17 – 2020-02-20 (×7): 25 mg via ORAL
  Filled 2020-02-17 (×7): qty 1

## 2020-02-17 MED ORDER — CIPROFLOXACIN IN D5W 400 MG/200ML IV SOLN
400.0000 mg | Freq: Once | INTRAVENOUS | Status: AC
  Administered 2020-02-17: 400 mg via INTRAVENOUS
  Filled 2020-02-17: qty 200

## 2020-02-17 NOTE — Plan of Care (Signed)
64 year old female admitted with abdominal pain nausea and vomiting found to have colitis with evidence of edema to the cecum and ileum.  She is started on Rocephin and Flagyl.  She is on clear liquid diet.  She still has profound leukocytosis but improved since admission.  Plan is to continue IV antibiotics continue clear liquid diet and IV fluids.  Recheck labs in a.m.  PT consult.

## 2020-02-17 NOTE — H&P (Addendum)
History and Physical    [name] [id number] DOB: 11-04-1956 DOA: 02/16/2020  PCP: Maurice Small, MD  Patient coming from: Home  I have personally briefly reviewed patient's old medical records in [place]  Chief Complaint:  Chief Complaint  Patient presents with  . Abdominal Pain  . Nausea  . Emesis  . Melena     HPI: [name] is a 64 y.o. female with medical history significant of HTN, OSA on CPAP, CAD, PUD.  Patient states she had surgery 6 days ago on her knee.  She had several days of constipation, but started passing stool 3 days ago.  Patient states since then, she has been having nausea, vomiting, generalized abdominal pain which feels gassy, and dark tarry stools.  She states she has a history of PUD, takes Protonix intermittently, but none recently.  She states she has been feeling very tired and weak.  She denies fevers, chills, chest pain, shortness of breath, cough, or urinary symptoms.  She is not on blood thinners, although was started on aspirin postop.  She was told to discontinue it tonight.  She has a history of an open gallbladder surgery, no other abdominal surgeries.  Of note, also looks like she was put on 15 days of PO iron post op as well.   ED Course: Hemoccult neg.  HGB nl.  WBC 29k, pt with chronic leukocytosis.  No other SIRS.  CT abd/pelvis with edema of terminal ileum and cecum, infectious vs inflamatory.  Also incidental liver lesion that could just be a hemangioma.   Review of Systems: As per HPI, otherwise all review of systems negative.  Past Medical History:  Diagnosis Date  . Agoraphobia without history of panic disorder   . Anginal pain ([place])   . Anxiety   . Arthritis    "qwhere" (04/03/2018)  . Childhood asthma   . Chronic bronchitis ([place])   . Chronic lower back pain   . COPD (chronic obstructive pulmonary disease) ([place])   . Coronary artery disease CARDIOLOGIST- DR  Doylene Canard  (VISIT 03-30-11 W/ CHART)   NON-OBS.  CAD   (STRESS TEST NOV. 2011  . Daily headache   . DDD (degenerative disc disease)   . Depression   . DJD (degenerative joint disease)    JOINT PAIN  . Dyspnea    "anytime but mostly on exertion and smoking"  . Elevated liver enzymes   . Fibromyalgia   . Frequency of urination   . GERD (gastroesophageal reflux disease) AND HIATIAL HERNIA   CONTROLLED W/ NEXIUM  . Heart murmur   . Hemorrhoids   . High cholesterol   . History of blood transfusion    "related to anemia" (04/03/2018)  . History of carpal tunnel syndrome    Right  . History of gastric ulcer 2004  . History of hiatal hernia   . Hypertension   . IBS (irritable bowel syndrome)   . Incisional pain s/p interstim implant 1st stage--- 12-07-11    left upper buttock-- pt states dressing clean dry and intact (on 12-08-11)  . Insomnia   . Iron deficiency anemia   . Leukocytosis   . Nocturia   . Non-productive cough   . Numbness in both hands AT TIMES  . OSA (obstructive sleep apnea)    "suppose to wear mask; I don't" (04/03/2018)  . PONV (postoperative nausea and vomiting)   . SVT (supraventricular tachycardia) ([place])   . Urge urinary incontinence     Past  Surgical History:  Procedure Laterality Date  . ANTERIOR CERVICAL DECOMP/DISCECTOMY FUSION  2008   C4 - T1 ("screws from 1st OR came out")  . ANTERIOR CERVICAL DECOMP/DISCECTOMY FUSION  2002   C4 - 7  . ANTERIOR LATERAL LUMBAR FUSION WITH PERCUTANEOUS SCREW 2 LEVEL Left 11/27/2018   Procedure: XLIF Lumbar 3-5, posterior spinal fusion L3-5;  Surgeon: Melina Schools, MD;  Location: [facility];  Service: Orthopedics;  Laterality: Left;  5.5 hrs for entire procedure  . APPENDECTOMY  1982  . BACK SURGERY    . CARDIAC CATHETERIZATION  2003  . CARDIAC CATHETERIZATION N/A 08/23/2016   Procedure: Left Heart Cath and Coronary Angiography;  Surgeon: Dixie Dials, MD;  Location: [facility];  Service: Cardiovascular;  Laterality: N/A;  . CARPAL TUNNEL RELEASE Right   .  CATARACT EXTRACTION W/ INTRAOCULAR LENS  IMPLANT, BILATERAL  2016-2018  . CHOLECYSTECTOMY OPEN  1982  . COLONOSCOPY    . CYSTO/ HOD/ BLADDER BX  2006  . CYSTO/ HOD/ BLADDER BX/ FULGERATION  06-12-2011  . FINGER SURGERY Right 2001   "replaced worn cartilage on thumb w/tendons"  . HYSTEROSCOPY WITH D & C  2005  . INTERSTIM IMPLANT PLACEMENT  12/07/2011   Procedure: Barrie Lyme IMPLANT FIRST STAGE;  Surgeon: [name], MD;  Location: [facility];  Service: Urology;  Laterality: Right;  . INTERSTIM IMPLANT PLACEMENT  12/14/2011   Procedure: Barrie Lyme IMPLANT SECOND STAGE;  Surgeon: [name], MD;  Location: [facility];  Service: Urology;  Laterality: Right;  rad tech ok by vickie at [facility]  . LUMBAR LAMINECTOMY  2007   L3 - 5  . TONSILLECTOMY AND ADENOIDECTOMY  1965  . TOTAL KNEE ARTHROPLASTY Right 02/10/2020   Procedure: TOTAL KNEE ARTHROPLASTY;  Surgeon: Paralee Cancel, MD;  Location: [facility];  Service: Orthopedics;  Laterality: Right;  70 mins  . TUBAL LIGATION    . UPPER GI ENDOSCOPY    . WRIST SURGERY Left    TFCC ("tendon repair")     reports that she has been smoking cigarettes. She has a 86.00 pack-year smoking history. She has never used smokeless tobacco. She reports previous alcohol use. She reports previous drug use. Drug: Marijuana.  Allergies  Allergen Reactions  . Midazolam Hcl Other (See Comments)    HYPER  . Prednisone Other (See Comments)    HYPER, depressed, moody  . Statins Other (See Comments)    Causes muscle weakness and joint pain  . Ace Inhibitors Other (See Comments)    DECREASES HEARTRATE  . Amoxicillin Diarrhea    Did it involve swelling of the face/tongue/throat, SOB, or low BP? No Did it involve sudden or severe rash/hives, skin peeling, or any reaction on the inside of your mouth or nose? No Did you need to seek medical attention at a hospital or doctor's office? No When did it last happen?~2019 or so If  all above answers are "NO", may proceed with cephalosporin use.   . Nsaids Other (See Comments)    AVOIDS; HX GASTRIC ULCER  . Sulfa Antibiotics Rash and Other (See Comments)    JOINT PAIN    Family History  Problem Relation Age of Onset  . Hypertension Father   . Heart disease Father   . Kidney failure Father   . Breast cancer Mother   . Hypertension Mother   . Coronary artery disease Mother   . Alzheimer's disease Mother   . Hypertension Brother   . Hypertension Sister   .  Hypertension Daughter      Prior to Admission medications   Medication Sig Start Date End Date Taking? Authorizing Provider  acetaminophen (TYLENOL) 500 MG tablet Take 2 tablets (1,000 mg total) by mouth every 8 (eight) hours. 02/10/20  Yes Babish, Rodman Key, PA-C  albuterol (PROVENTIL HFA;VENTOLIN HFA) 108 (90 BASE) MCG/ACT inhaler Inhale 2 puffs into the lungs every 6 (six) hours as needed for wheezing or shortness of breath.   Yes [provider]  aspirin (ASPIRIN CHILDRENS) 81 MG chewable tablet Chew 1 tablet (81 mg total) by mouth 2 (two) times daily. Take for 4 weeks, then resume regular dose. 02/11/20 03/12/20 Yes Babish, Rodman Key, PA-C  buPROPion (WELLBUTRIN XL) 300 MG 24 hr tablet Take 300 mg by mouth daily.   Yes [provider]  busPIRone (BUSPAR) 10 MG tablet Take 10 mg by mouth 2 (two) times daily.   Yes [provider]  clonazePAM (KLONOPIN) 1 MG tablet Take 1 tablet (1 mg total) by mouth at bedtime. Patient taking differently: Take 1 mg by mouth 2 (two) times daily.  11/29/18  Yes Melina Schools, MD  diltiazem (CARDIZEM CD) 120 MG 24 hr capsule Take 1 capsule (120 mg total) by mouth daily. 07/24/19  Yes Camnitz, Will Hassell Done, MD  DULoxetine (CYMBALTA) 60 MG capsule Take 60 mg by mouth at bedtime.    Yes [provider]  ferrous sulfate (FERROUSUL) 325 (65 FE) MG tablet Take 1 tablet (325 mg total) by mouth 3 (three) times daily with meals for 14 days. 02/10/20 02/24/20 Yes  Babish, Rodman Key, PA-C  gabapentin (NEURONTIN) 100 MG capsule Take 1 capsule (100 mg total) by mouth at bedtime. Patient taking differently: Take 200 mg by mouth at bedtime.  11/29/18  Yes Melina Schools, MD  hydrALAZINE (APRESOLINE) 25 MG tablet Take 25 mg by mouth 2 (two) times daily.    Yes [provider]  hydrochlorothiazide (HYDRODIURIL) 25 MG tablet TAKE 1 TABLET BY MOUTH EVERY DAY Patient taking differently: Take 25 mg by mouth daily.  05/07/19  Yes Camnitz, Ocie Doyne, MD  metoprolol tartrate (LOPRESSOR) 25 MG tablet Take 0.5 tablets (12.5 mg total) by mouth 2 (two) times daily. 07/04/19  Yes Dixie Dials, MD  nitroGLYCERIN (NITROSTAT) 0.4 MG SL tablet Place 1 tablet (0.4 mg total) under the tongue every 5 (five) minutes x 3 doses as needed for chest pain. 08/24/16  Yes Eugenie Filler, MD  oxyCODONE (OXY IR/ROXICODONE) 5 MG immediate release tablet Take 1-2 tablets (5-10 mg total) by mouth every 4 (four) hours as needed for moderate pain or severe pain. 02/10/20  Yes Babish, Rodman Key, PA-C  pantoprazole (PROTONIX) 40 MG tablet Take 1 tablet (40 mg total) by mouth daily as needed. Patient taking differently: Take 40 mg by mouth daily as needed. Heart Burn 08/24/16  Yes Eugenie Filler, MD  polyethylene glycol (MIRALAX / GLYCOLAX) 17 g packet Take 17 g by mouth 2 (two) times daily. 02/10/20  Yes Babish, Rodman Key, PA-C  tiZANidine (ZANAFLEX) 4 MG tablet Take 1 tablet (4 mg total) by mouth 3 (three) times daily as needed for muscle spasms. 02/10/20  Yes Babish, Rodman Key, PA-C  Vitamin D, Ergocalciferol, (DRISDOL) 1.25 MG (50000 UNIT) CAPS capsule Take 50,000 Units by mouth every Wednesday. 12/03/19  Yes [provider]    Physical Exam: Vitals:   02/16/20 2147 02/16/20 2249 02/16/20 2326 02/17/20 0013  BP: 115/72 (!) 146/70 (!) 126/55 (!) 152/66  Pulse: 89 74 69 74  Resp: 19 20 (!) 22  20  Temp: 98 F (36.7 C)     TempSrc: Oral     SpO2: 94% 96% 96% 95%     Constitutional: NAD, calm, comfortable Eyes: PERRL, lids and conjunctivae normal ENMT: Mucous membranes are moist. Posterior pharynx clear of any exudate or lesions.Normal dentition.  Neck: normal, supple, no masses, no thyromegaly Respiratory: clear to auscultation bilaterally, no wheezing, no crackles. Normal respiratory effort. No accessory muscle use.  Cardiovascular: Regular rate and rhythm, no murmurs / rubs / gallops. No extremity edema. 2+ pedal pulses. No carotid bruits.  Abdomen: Mild diffuse TTP, no rebound, no guarding Musculoskeletal: no clubbing / cyanosis. No joint deformity upper and lower extremities. Good ROM, no contractures. Normal muscle tone.  Skin: no rashes, lesions, ulcers. No induration Neurologic: CN 2-12 grossly intact. Sensation intact, DTR normal. Strength 5/5 in all 4.  Psychiatric: Normal judgment and insight. Alert and oriented x 3. Normal mood.    Labs on Admission: I have personally reviewed following labs and imaging studies  CBC: Recent Labs  Lab 02/11/20 0509 02/16/20 2225  WBC 26.5* 29.9*  HGB 11.8* 12.3  HCT 36.9 38.4  MCV 93.4 94.3  PLT 309 [id number]*   Basic Metabolic Panel: Recent Labs  Lab 02/11/20 0509 02/16/20 2225  NA 139 136  K 3.7 2.7*  CL 101 91*  CO2 27 34*  GLUCOSE 156* 125*  BUN 14 27*  CREATININE 0.99 1.12*  CALCIUM 9.4 8.9   GFR: Estimated Creatinine Clearance: 43.4 mL/min (A) (by C-G formula based on SCr of 1.12 mg/dL (H)). Liver Function Tests: Recent Labs  Lab 02/16/20 2225  AST 35  ALT 32  ALKPHOS 98  BILITOT 0.8  PROT 7.1  ALBUMIN 3.9   Recent Labs  Lab 02/16/20 2226  LIPASE 31   No results for input(s): AMMONIA in the last 168 hours. Coagulation Profile: No results for input(s): INR, PROTIME in the last 168 hours. Cardiac Enzymes: No results for input(s): CKTOTAL, CKMB, CKMBINDEX, TROPONINI in the last 168 hours. BNP (last 3 results) No results for input(s): PROBNP in the last 8760  hours. HbA1C: No results for input(s): HGBA1C in the last 72 hours. CBG: No results for input(s): GLUCAP in the last 168 hours. Lipid Profile: No results for input(s): CHOL, HDL, LDLCALC, TRIG, CHOLHDL, LDLDIRECT in the last 72 hours. Thyroid Function Tests: No results for input(s): TSH, T4TOTAL, FREET4, T3FREE, THYROIDAB in the last 72 hours. Anemia Panel: No results for input(s): VITAMINB12, FOLATE, FERRITIN, TIBC, IRON, RETICCTPCT in the last 72 hours. Urine analysis:    Component Value Date/Time   COLORURINE AMBER (A) 02/24/2017 1917   APPEARANCEUR CLOUDY (A) 02/24/2017 1917   LABSPEC 1.020 02/24/2017 1917   PHURINE 5.0 02/24/2017 [place] 02/24/2017 [place] 02/24/2017 [place] NEGATIVE 02/24/2017 1917   KETONESUR 5 (A) 02/24/2017 1917   PROTEINUR NEGATIVE 02/24/2017 1917   UROBILINOGEN 0.2 09/17/2008 2342   NITRITE NEGATIVE 02/24/2017 1917   LEUKOCYTESUR TRACE (A) 02/24/2017 1917    Radiological Exams on Admission: CT ABDOMEN PELVIS W CONTRAST  Result Date: 02/17/2020 CLINICAL DATA:  Nausea vomiting black stools EXAM: CT ABDOMEN AND PELVIS WITH CONTRAST TECHNIQUE: Multidetector CT imaging of the abdomen and pelvis was performed using the standard protocol following bolus administration of intravenous contrast. CONTRAST:  176mL OMNIPAQUE IOHEXOL 300 MG/ML  SOLN COMPARISON:  None. FINDINGS: Lower chest: The visualized heart size within normal limits. No pericardial fluid/thickening. No hiatal hernia. The visualized portions of  the lungs are clear. Hepatobiliary: Slightly low-density liver parenchyma. In the posterior right liver lobe there is a 1.6 cm hyperdense lesion which appears to fill-in further with contrast on delayed images. Main portal vein is patent. The patient is status post cholecystectomy. No biliary ductal dilation. Pancreas: Unremarkable. No pancreatic ductal dilatation or surrounding inflammatory changes. Spleen: Normal in  size without focal abnormality. Adrenals/Urinary Tract: Both adrenal glands appear normal. The kidneys and collecting system appear normal without evidence of urinary tract calculus or hydronephrosis. Prominent right extrarenal pelvis is seen. Bladder is unremarkable. Stomach/Bowel: The stomach, small bowel, are normal in appearance. There is question of mild wall edema involving the cecum and terminal ileum. There is question of minimal surrounding fat stranding changes seen at the terminal ileum. Vascular/Lymphatic: There are no enlarged mesenteric, retroperitoneal, or pelvic lymph nodes. Scattered aortic atherosclerotic calcifications are seen without aneurysmal dilatation. Reproductive: The uterus and adnexa are unremarkable. Other: Small fat containing inguinal hernias are noted. Musculoskeletal: No acute or significant osseous findings. Again noted is prior lumbar spine fixation from L3 through L5 with interbody fusion. IMPRESSION: 1. Edematous appearance to the cecum and terminal ileum. Findings may be infectious, inflammatory, or secondary to hypoalbuminemia. 2. 1.6 cm posterior right liver lobe hyperdense lesion which is nonspecific, and could represent a hemangioma. If further evaluation is required would recommend MRI with contrast. 3.  Aortic Atherosclerosis (ICD10-I70.0). Electronically Signed   By: Prudencio Pair M.D.   On: 02/17/2020 00:26    EKG: Independently reviewed.  Assessment/Plan Principal Problem:   Colitis presumed infectious Active Problems:   Leukocytosis   OSA on CPAP   Hypertension   Hypokalemia   S/P right TKA   Liver lesion    1. Colitis presumed infectious - 1. Got cipro / flagyl in ED 2. Will put on rocephin / flagyl 3. IVF: LR at 100 4. zofran PRN nausea 2. Dark stool - 1. Hemoccult neg 2. Likely just po Iron she started this past week turning her stool dark. 3. Leukocytosis - 1. Unclear how much is acute vs chronic 2. WBC 26k on 2/24 and 19k on  2/15. 3. Repeat CBC with DIFF in AM 4. Smear review by pathologist: ? CLL? 5. Bone marrow Bx in 2016 for her leukocytosis doesn't seem to have shown anything abnormal at that time. 4. Hypokalemia - replace 5. HTN - 1. Cont home BP meds 2. But holding HCTZ due to hypokalemia 6. OSA - cont CPAP 7. Liver lesion - 1. ? Hemagioma? 2. Consider MRI as outpt, radiologist didn't sound too impressed though. 8. S/P TKA - 1. PT/OT  DVT prophylaxis: Lovenox Code Status: Full Family Communication: No family in room Disposition Plan: Home after admit Consults called: None Admission status: Place in 13    Sierra Spargo, [place] Hospitalists  How to contact the [facility] Attending or Consulting provider [place] or covering provider during after hours [place], for this patient?  1. Check the care team in [facility] and look for a) attending/consulting [facility] provider listed and b) the [facility] team listed 2. Log into [email or web address]  Amion Physician Scheduling and messaging for groups and whole hospitals  On call and physician scheduling software for group practices, residents, hospitalists and other medical providers for call, clinic, rotation and shift schedules. OnCall [employer] is a hospital-wide system for scheduling doctors and paging doctors on call. EasyPlot is for scientific plotting and data analysis.  [email or web address]  and use [place]'s universal password to access. If  you do not have the password, please contact the hospital operator.  3. Locate the [facility] provider you are looking for under [facility] and page to a number that you can be directly reached. 4. If you still have difficulty reaching the provider, please page the [facility] (Director on Call) for the Hospitalists listed on amion for assistance.  02/17/2020, 1:12 AM

## 2020-02-17 NOTE — Plan of Care (Signed)

## 2020-02-17 NOTE — Progress Notes (Signed)
PHARMACY NOTE:  ANTIMICROBIAL  DOSAGE ADJUSTMENT  Current antimicrobial regimen includes a mismatch between antimicrobial dosage and indication.  As per policy approved by the Pharmacy & Therapeutics and Medical Executive Committees, the antimicrobial dosage will be adjusted accordingly.  Current antimicrobial dosage:  Rocephin 1 gm IV q24h  Indication: IAI    Antimicrobial dosage has been changed to:  Rocephin 2 Gm IV q24h  Additional comments:   Thank you for allowing pharmacy to be a part of this patient's care.  Dorrene German, [facility] [address] 02/17/2020 1:13 AM

## 2020-02-17 NOTE — Care Plan (Signed)
Ortho Bundle Case Management Note  Patient Details  Name: KATHLEEN CHANEY MRN: [id number] Date of Birth: 09/30/1956  R TKA on 02-10-20 DCP:  Home with family friend.  Lives in a mobile home with 4 ste. DME:  No needs.  Has a RW and 3-in-1. PT:  Attending OP PT at [facility].               DME Arranged:  N/A DME Agency:  NA  HH Arranged:  NA HH Agency:  NA  Additional Comments: Please contact me with any questions of if this plan should need to change. Leonides Grills, RN, CCM. [phone].   02/17/2020, 8:32 AM

## 2020-02-17 NOTE — Evaluation (Signed)
Physical Therapy Evaluation Patient Details Name: [name] MRN: [id number] DOB: 1956/01/25 Today's Date: 02/17/2020   History of Present Illness  Patient is 64 y.o. female s/p Rt TKA on 02/10/20 with PMH significant for CAD, HTN, fibromyalgia, SVT, HLD, GERD, COPD, OA, anxiety, XLIF L3-5 and posterior spinal fusion (left) L3-5.  Clinical Impression  Pt admitted with above diagnosis. Pt ambulated 160' with RW and performed R TKA exercises with min assist. Good progress expected. Pt currently with functional limitations due to the deficits listed below (see PT Problem List). Pt will benefit from skilled PT to increase their independence and safety with mobility to allow discharge to the venue listed below.       Follow Up Recommendations Follow surgeon's recommendation for DC plan and follow-up therapies;Outpatient PT    Equipment Recommendations  None recommended by PT(youth walker)    Recommendations for Other Services       Precautions / Restrictions Precautions Precautions: Fall;Knee Precaution Comments: reviewed no pillow under knee Restrictions Weight Bearing Restrictions: No Other Position/Activity Restrictions: WBAT RLE      Mobility  Bed Mobility         Supine to sit: Supervision     General bed mobility comments: up in recliner  Transfers Overall transfer level: Needs assistance Equipment used: Rolling walker (2 wheeled) Transfers: Sit to/from Stand Sit to Stand: Min guard         General transfer comment: for safety, 2* recent fall at home.  Cues for hand placement  Ambulation/Gait Ambulation/Gait assistance: Supervision Gait Distance (Feet): 160 Feet Assistive device: Rolling walker (2 wheeled) Gait Pattern/deviations: Step-to pattern;Trunk flexed;Decreased stride length Gait velocity: decr   General Gait Details: good sequencing, no loss of balance, VCs for posture, pt reports trunk flexion is baseline 2* "back problems"  Stairs            Wheelchair Mobility    Modified Rankin (Stroke Patients Only)       Balance Overall balance assessment: Modified Independent   Sitting balance-Leahy Scale: Good     Standing balance support: During functional activity;Bilateral upper extremity supported Standing balance-Leahy Scale: Fair                               Pertinent Vitals/Pain Pain Score: 9  Pain Location: R knee Pain Descriptors / Indicators: Grimacing;Guarding;Sore;Aching Pain Intervention(s): Limited activity within patient's tolerance;Monitored during session;Premedicated before session;Ice applied    Home Living Family/patient expects to be discharged to:: Private residence Living Arrangements: Alone Available Help at Discharge: Friend(s) Type of Home: Mobile home Home Access: Stairs to enter Entrance Stairs-Rails: Psychiatric nurse of Steps: 4+1 Home Layout: One level Home Equipment: Environmental consultant - 4 wheels;Cane - single point;Shower seat Additional Comments: friend was present when pt had a fall at home. She has been staying with her    Prior Function Level of Independence: Independent with assistive device(s)         Comments: using RW since TKA     Hand Dominance   Dominant Hand: Left    Extremity/Trunk Assessment   Upper Extremity Assessment Upper Extremity Assessment: Defer to OT evaluation    Lower Extremity Assessment Lower Extremity Assessment: RLE deficits/detail RLE Deficits / Details: 3/5 SLR, knee AAROM 10-85* RLE Sensation: WNL RLE Coordination: WNL    Cervical / Trunk Assessment Cervical / Trunk Assessment: Kyphotic(pt reports h/o "back problems")  Communication   Communication: No difficulties  Cognition Arousal/Alertness: Awake/alert  Behavior During Therapy: WFL for tasks assessed/performed Overall Cognitive Status: Within Functional Limits for tasks assessed                                        General Comments  General comments (skin integrity, edema, etc.): knee precautions written on white board; notified RN that pt likely needs new ted hose 2* TKA last week    Exercises Total Joint Exercises Ankle Circles/Pumps: AROM;Both;10 reps;Supine Quad Sets: AROM;Seated;Right;Left;5 reps Short Arc Quad: AROM;Right;10 reps;Supine Heel Slides: AROM;Right;10 reps;Supine;AAROM Hip ABduction/ADduction: AROM;Right;10 reps;Supine Straight Leg Raises: AROM;Right;Supine;5 reps Long Arc Quad: AROM;Right;10 reps;Seated Knee Flexion: AROM;Right;10 reps;Seated Goniometric ROM: 10-85* AAROM R knee   Assessment/Plan    PT Assessment Patient needs continued PT services  PT Problem List Decreased strength;Decreased mobility;Decreased range of motion;Decreased activity tolerance;Decreased balance;Decreased knowledge of use of DME       PT Treatment Interventions DME instruction;Therapeutic exercise;Balance training;Gait training;Stair training;Functional mobility training;Therapeutic activities;Patient/family education    PT Goals (Current goals can be found in the Care Plan section)  Acute Rehab PT Goals Patient Stated Goal: yardwork PT Goal Formulation: With patient Time For Goal Achievement: 03/02/20 Potential to Achieve Goals: Good    Frequency 7X/week   Barriers to discharge        Co-evaluation               AM-PAC PT "6 Clicks" Mobility  Outcome Measure Help needed turning from your back to your side while in a flat bed without using bedrails?: A Little Help needed moving from lying on your back to sitting on the side of a flat bed without using bedrails?: A Little Help needed moving to and from a bed to a chair (including a wheelchair)?: A Little Help needed standing up from a chair using your arms (e.g., wheelchair or bedside chair)?: A Little Help needed to walk in hospital room?: A Little Help needed climbing 3-5 steps with a railing? : A Little 6 Click Score: 18    End of Session  Equipment Utilized During Treatment: Gait belt Activity Tolerance: Patient tolerated treatment well Patient left: with chair alarm set;in chair;with call bell/phone within reach Nurse Communication: Mobility status PT Visit Diagnosis: Muscle weakness (generalized) (M62.81);Difficulty in walking, not elsewhere classified (R26.2)    Time: 1135-1204 PT Time Calculation (min) (ACUTE ONLY): 29 min   Charges:   PT Evaluation $PT Eval Low Complexity: 1 Low PT Treatments $Gait Training: 8-22 mins       Blondell Reveal Kistler PT 02/17/2020  Acute Rehabilitation Services Pager [phone] Office [phone]

## 2020-02-17 NOTE — Evaluation (Addendum)
Occupational Therapy Evaluation Patient Details Name: [name] MRN: [id number] DOB: 12/28/1955 Today'Meadows Date: 02/17/2020    History of Present Illness Patient is 64 y.o. female Meadows/p Rt TKA on 02/10/20 with PMH significant for CAD, HTN, fibromyalgia, SVT, HLD, GERD, COPD, OA, anxiety, XLIF L3-5 and posterior spinal fusion (left) L3-5.   Clinical Impression   Pt was admitted for the above. At baseline, friend has been staying with her since R TKA on 2/23. Pt reports that she did fall prior to this admission (did not see in chart).  She can sometimes perform ADL with increased pain and sometimes has assistance. Will follow in acute with supervision level goals. Pt needs set up to min A at this time.    Follow Up Recommendations  Supervision/Assistance - 24 hour    Equipment Recommendations  None recommended by OT    Recommendations for Other Services       Precautions / Restrictions Precautions Precautions: Fall;Knee Restrictions Weight Bearing Restrictions: No      Mobility Bed Mobility         Supine to sit: Supervision     General bed mobility comments: extra time and use of bedrail; HOB raised  Transfers   Equipment used: Rolling walker (2 wheeled)   Sit to Stand: Min guard         General transfer comment: for safety, 2* recent fall at home.  Cues for hand placement    Balance                                           ADL either performed or assessed with clinical judgement   ADL Overall ADL'Meadows : Needs assistance/impaired Eating/Feeding: Independent   Grooming: Set up   Upper Body Bathing: Set up   Lower Body Bathing: Minimal assistance   Upper Body Dressing : Set up   Lower Body Dressing: Minimal assistance   Toilet Transfer: Min guard;Stand-pivot;RW(chair)   Toileting- Clothing Manipulation and Hygiene: Min guard         General ADL Comments: pt states she had a fall at home and has decreased balance at baseline. Friend  is staying with her.  Reinforced knee precautions.  IV was tethered to bed; NT locating bucket for 3:1 commode. Pt with purewick on.     Vision         Perception     Praxis      Pertinent Vitals/Pain Pain Score: 10-Worst pain ever Pain Location: Rt knee Pain Descriptors / Indicators: Aching;Sore Pain Intervention(Meadows): Limited activity within patient'Meadows tolerance;Monitored during session;Repositioned;Ice applied;Patient requesting pain meds-RN notified     Hand Dominance Left   Extremity/Trunk Assessment Upper Extremity Assessment Upper Extremity Assessment: Overall WFL for tasks assessed           Communication Communication Communication: No difficulties   Cognition Arousal/Alertness: Awake/alert Behavior During Therapy: WFL for tasks assessed/performed Overall Cognitive Status: Within Functional Limits for tasks assessed                                     General Comments  knee precautions written on white board; notified RN that pt likely needs new ted hose 2* TKA last week    Exercises     Shoulder Instructions      Home Living Family/patient expects to  be discharged to:: Private residence Living Arrangements: Alone Available Help at Discharge: Friend(Meadows);Other (Comment)                         Home Equipment: Bedside commode;Walker - 4 wheels;Walker - 2 wheels;Cane - single point   Additional Comments: friend was present when pt had a fall at home. She has been staying with her      Prior Functioning/Environment Level of Independence: Needs assistance        Comments: friend assisted as needed with adls; pt could often do it herself.  Pt reports she did not shower at home        OT Problem List: Decreased strength;Decreased activity tolerance;Impaired balance (sitting and/or standing);Pain      OT Treatment/Interventions: Self-care/ADL training;DME and/or AE instruction;Therapeutic activities;Patient/family  education;Balance training    OT Goals(Current goals can be found in the care plan section) Acute Rehab OT Goals Patient Stated Goal: less pain OT Goal Formulation: With patient Time For Goal Achievement: 02/24/20 Potential to Achieve Goals: Good ADL Goals Pt Will Transfer to Toilet: with supervision;ambulating;bedside commode Pt Will Perform Toileting - Clothing Manipulation and hygiene: with supervision;sit to/from stand Pt Will Perform Tub/Shower Transfer: Shower transfer;with min guard assist;3 in 1  OT Frequency: Min 2X/week   Barriers to D/C:            Co-evaluation              AM-PAC OT "6 Clicks" Daily Activity     Outcome Measure Help from another person eating meals?: None Help from another person taking care of personal grooming?: A Little Help from another person toileting, which includes using toliet, bedpan, or urinal?: A Little Help from another person bathing (including washing, rinsing, drying)?: A Little Help from another person to put on and taking off regular upper body clothing?: A Little Help from another person to put on and taking off regular lower body clothing?: A Little 6 Click Score: 19   End of Session    Activity Tolerance: Patient limited by pain Patient left: in chair;with call bell/phone within reach;with chair alarm set  OT Visit Diagnosis: Unsteadiness on feet (R26.81);Pain Pain - Right/Left: Right Pain - part of body: Knee                Time: 0935-1000 OT Time Calculation (min): 25 min Charges:  OT General Charges $OT Visit: 1 Visit OT Evaluation $OT Eval Low Complexity: 1 Low  [name], OTR/L Acute Rehabilitation Services 02/17/2020  [name] 02/17/2020, 10:35 AM

## 2020-02-17 NOTE — Progress Notes (Signed)
Pt declined cpap for tonight

## 2020-02-17 NOTE — ED Notes (Signed)
POC occult blood test was negative. Appears results did not cross over will aptempt again

## 2020-02-18 LAB — COMPREHENSIVE METABOLIC PANEL
ALT: 23 U/L (ref 0–44)
AST: 26 U/L (ref 15–41)
Albumin: 2.8 g/dL — ABNORMAL LOW (ref 3.5–5.0)
Alkaline Phosphatase: 67 U/L (ref 38–126)
Anion gap: 8 (ref 5–15)
BUN: 18 mg/dL (ref 8–23)
CO2: 29 mmol/L (ref 22–32)
Calcium: 8.4 mg/dL — ABNORMAL LOW (ref 8.9–10.3)
Chloride: 99 mmol/L (ref 98–111)
Creatinine, Ser: 0.96 mg/dL (ref 0.44–1.00)
GFR calc Af Amer: >60 mL/min (ref >60)
GFR calc non Af Amer: >60 mL/min (ref >60)
Glucose, Bld: 132 mg/dL — ABNORMAL HIGH (ref 70–99)
Potassium: 3.6 mmol/L (ref 3.5–5.1)
Sodium: 136 mmol/L (ref 135–145)
Total Bilirubin: 0.5 mg/dL (ref 0.3–1.2)
Total Protein: 5.3 g/dL — ABNORMAL LOW (ref 6.5–8.1)

## 2020-02-18 LAB — CBC
HCT: 32.5 % — ABNORMAL LOW (ref 36.0–46.0)
Hemoglobin: 10 g/dL — ABNORMAL LOW (ref 12.0–15.0)
MCH: 29.9 pg (ref 26.0–34.0)
MCHC: 30.8 g/dL (ref 30.0–36.0)
MCV: 97 fL (ref 80.0–100.0)
Platelets: 285 10*3/uL (ref 150–400)
RBC: 3.35 MIL/uL — ABNORMAL LOW (ref 3.87–5.11)
RDW: 14.7 % (ref 11.5–15.5)
WBC: 17.7 10*3/uL — ABNORMAL HIGH (ref 4.0–10.5)
nRBC: 0 % (ref 0.0–0.2)

## 2020-02-18 NOTE — Progress Notes (Signed)
PROGRESS NOTE    [name]    Code Status: Full Code  SWN:[id number] DOB: [birth year] DOA: 02/16/2020 LOS: 1 days  PCP: Maurice Small, MD CC:  Chief Complaint  Patient presents with  . Abdominal Pain  . Nausea  . Emesis  . [facility] Summary   This is a 63 year old female with history of hypertension, OSA on CPAP, fibromyalgia, GERD, hyperlipidemia CAD, PUD status post recent right total knee arthroplasty (02/10/2020) who was admitted on 3/2 with generalized abdominal pain, nausea, vomiting with fatigue and weakness found to have presumed infectious colitis on CT scan and started on empiric antibiotics as well as IV fluids.  Complained of dark stool with negative Hemoccult thought to be secondary to iron which she recently started taking.  Ortho was consulted for evaluation of her recent surgery.  A & P   Principal Problem:   Colitis presumed infectious Active Problems:   Leukocytosis   OSA on CPAP   Hypertension   Hypokalemia   S/P right TKA   Liver lesion   1. Colitis, presumed infectious a. Got Cipro/Flagyl in ED b. Continue Rocephin/Flagyl for now c. Leukocytosis trending down but has chronically elevated WBC with apparently unremarkable bone marrow biopsy several years ago d. Discontinue IV fluids now that she is tolerating clear liquid diet e. Zofran as needed for nausea f. Follow-up stool testing 2. Dark stool, likely from p.o. iron a. Hemoccult negative b. Continue to monitor 3. Acute on chronic leukocytosis a. Improving with IV antibiotics b. Bone marrow BX in 2016 apparently unremarkable c. Path review: Leukocytosis with neutrophilia, polychromasia 4. Hypokalemia, resolved 5. Hypertension a. Continue holding HCTZ due to hypokalemia b. Continue other BP meds 6. OSA on CPAP 7. 1.6 cm posterior right hyperdense nonspecific liver lobe lesion a. Consider outpatient MRI with contrast for further evaluation 8. Aortic atherosclerosis a. Check lipid  panel 9. Recent right total knee arthroplasty (02/10/20) a. Per Ortho: Up with therapy, continue working with PT and change dressing to a new 10 inch Aquacel dressing 10. Hyperglycemia a. Check HA1C  DVT prophylaxis: lovenox Family Communication: No family at bedside Disposition Plan:   Patient came from:   Home                                                                                          Anticipated d/c place: Home pending PT recs  Barriers to d/c: Still requiring IV antibiotics likely discharge in next 24 to 48 hours  Pressure injury documentation    None  Consultants  Ortho  Procedures  None  Antibiotics   Anti-infectives (From admission, onward)   Start     Dose/Rate Route Frequency Ordered Stop   02/17/20 1000  cefTRIAXone (ROCEPHIN) 2 g in sodium chloride 0.9 % 100 mL IVPB     2 g 200 mL/hr over 30 Minutes Intravenous Every 24 hours 02/17/20 0101     02/17/20 0900  metroNIDAZOLE (FLAGYL) IVPB 500 mg     500 mg 100 mL/hr over 60 Minutes Intravenous Every 8 hours 02/17/20 0101     02/17/20 0045  ciprofloxacin (  CIPRO) IVPB 400 mg     400 mg 200 mL/hr over 60 Minutes Intravenous  Once 02/17/20 0033 02/17/20 0200   02/17/20 0045  metroNIDAZOLE (FLAGYL) IVPB 500 mg     500 mg 100 mL/hr over 60 Minutes Intravenous  Once 02/17/20 0033 02/17/20 1900        Subjective   Patient seen and examined at bedside in no acute distress and resting comfortably. No acute events overnight. Denies any acute complaints at this time. Tolerating diet well.  Denies any bowel movements recently.  Objective   Vitals:   02/17/20 1515 02/17/20 2101 02/18/20 0539 02/18/20 1248  BP: (!) 155/77 (!) 165/66 (!) 104/47 (!) 175/54  Pulse: 70 78 (!) 50 (!) 55  Resp: '18 18 18 18  ' Temp: 98.5 F (36.9 C) 98.3 F (36.8 C) 98.2 F (36.8 C) 98 F (36.7 C)  TempSrc: Oral Oral Oral Oral  SpO2: 96% 94% (!) 89% 98%  Weight:      Height:        Intake/Output Summary (Last 24 hours)  at 02/18/2020 1730 Last data filed at 02/18/2020 1642 Gross per 24 hour  Intake 2530.94 ml  Output 2550 ml  Net -19.06 ml   Filed Weights   02/17/20 0417  Weight: 76.2 kg    Examination:  Physical Exam Vitals and nursing note reviewed.  Constitutional:      Appearance: Normal appearance.  HENT:     Head: Normocephalic and atraumatic.  Eyes:     Conjunctiva/sclera: Conjunctivae normal.  Cardiovascular:     Rate and Rhythm: Normal rate and regular rhythm.  Pulmonary:     Effort: Pulmonary effort is normal.     Breath sounds: Normal breath sounds.  Abdominal:     General: Abdomen is flat.     Palpations: Abdomen is soft.  Musculoskeletal:        General: No swelling or tenderness.       Legs:  Skin:    Coloration: Skin is not jaundiced or pale.  Neurological:     Mental Status: She is alert. Mental status is at baseline.  Psychiatric:        Mood and Affect: Mood normal.        Behavior: Behavior normal.     Data Reviewed: I have personally reviewed following labs and imaging studies  CBC: Recent Labs  Lab 02/16/20 2225 02/17/20 0405 02/18/20 0441  WBC 29.9* 24.0* 17.7*  NEUTROABS  --  18.4*  --   HGB 12.3 11.2* 10.0*  HCT 38.4 36.2 32.5*  MCV 94.3 95.5 97.0  PLT 401* 249 673   Basic Metabolic Panel: Recent Labs  Lab 02/16/20 2225 02/17/20 0405 02/18/20 0441  NA 136 135 136  K 2.7* 3.2* 3.6  CL 91* 93* 99  CO2 34* 28 29  GLUCOSE 125* 105* 132*  BUN 27* 20 18  CREATININE 1.12* 0.80 0.96  CALCIUM 8.9 8.8* 8.4*   GFR: Estimated Creatinine Clearance: 51.3 mL/min (by C-G formula based on SCr of 0.96 mg/dL). Liver Function Tests: Recent Labs  Lab 02/16/20 2225 02/18/20 0441  AST 35 26  ALT 32 23  ALKPHOS 98 67  BILITOT 0.8 0.5  PROT 7.1 5.3*  ALBUMIN 3.9 2.8*   Recent Labs  Lab 02/16/20 2226  LIPASE 31   No results for input(s): AMMONIA in the last 168 hours. Coagulation Profile: No results for input(s): INR, PROTIME in the last 168  hours. Cardiac Enzymes: No results for input(s): CKTOTAL, CKMB,  CKMBINDEX, TROPONINI in the last 168 hours. BNP (last 3 results) No results for input(s): PROBNP in the last 8760 hours. HbA1C: No results for input(s): HGBA1C in the last 72 hours. CBG: No results for input(s): GLUCAP in the last 168 hours. Lipid Profile: No results for input(s): CHOL, HDL, LDLCALC, TRIG, CHOLHDL, LDLDIRECT in the last 72 hours. Thyroid Function Tests: No results for input(s): TSH, T4TOTAL, FREET4, T3FREE, THYROIDAB in the last 72 hours. Anemia Panel: No results for input(s): VITAMINB12, FOLATE, FERRITIN, TIBC, IRON, RETICCTPCT in the last 72 hours. Sepsis Labs: No results for input(s): PROCALCITON, LATICACIDVEN in the last 168 hours.  Recent Results (from the past 240 hour(s))  SARS Coronavirus 2 by RT PCR     Status: None   Collection Time: 02/17/20 12:34 AM  Result Value Ref Range Status   SARS Coronavirus 2 NEGATIVE NEGATIVE Final    Comment: (NOTE) Result indicates the ABSENCE of SARS-CoV-2 RNA in the patient specimen.  The lowest concentration of SARS-CoV-2 viral copies this assay can detect in nasopharyngeal swab specimens is 500 copies / mL.  A negative result does not preclude SARS-CoV-2 infection and should not be used as the sole basis for patient management decisions. A negative result may occur with improper specimen collection / handling, submission of a specimen other than nasopharyngeal swab, presence of viral mutation(s) within the areas targeted by this assay, and inadequate number of viral copies (<500 copies / mL) present.  Negative results must be combined with clinical observations, patient history, and epidemiological information.  The expected result is NEGATIVE.  Patient Fact Sheet:  BlogSelections.co.uk   Provider Fact Sheet:  https://lucas.com/   This test is not yet approved or cleared by the [place] FDA and  has been  authorized for  detection and/or diagnosis of SARS-CoV-2 by FDA under an Emergency Use Authorization (EUA).  This EUA will remain in effect (meaning this test can be used) for the duration of  the COVID-19 declaration under Section 564(b)(1) of the Act, 21 U.S.C. section 360bbb-3(b)(1), unless the authorization is terminated or revoked sooner Performed at [facility], [place] [address] [place], [place] [address]          Radiology Studies: CT ABDOMEN PELVIS W CONTRAST  Result Date: 02/17/2020 CLINICAL DATA:  Nausea vomiting black stools EXAM: CT ABDOMEN AND PELVIS WITH CONTRAST TECHNIQUE: Multidetector CT imaging of the abdomen and pelvis was performed using the standard protocol following bolus administration of intravenous contrast. CONTRAST:  126m OMNIPAQUE IOHEXOL 300 MG/ML  SOLN COMPARISON:  None. FINDINGS: Lower chest: The visualized heart size within normal limits. No pericardial fluid/thickening. No hiatal hernia. The visualized portions of the lungs are clear. Hepatobiliary: Slightly low-density liver parenchyma. In the posterior right liver lobe there is a 1.6 cm hyperdense lesion which appears to fill-in further with contrast on delayed images. Main portal vein is patent. The patient is status post cholecystectomy. No biliary ductal dilation. Pancreas: Unremarkable. No pancreatic ductal dilatation or surrounding inflammatory changes. Spleen: Normal in size without focal abnormality. Adrenals/Urinary Tract: Both adrenal glands appear normal. The kidneys and collecting system appear normal without evidence of urinary tract calculus or hydronephrosis. Prominent right extrarenal pelvis is seen. Bladder is unremarkable. Stomach/Bowel: The stomach, small bowel, are normal in appearance. There is question of mild wall edema involving the cecum and terminal ileum. There is question of minimal surrounding fat stranding changes seen at the terminal ileum. Vascular/Lymphatic: There are no  enlarged mesenteric, retroperitoneal, or pelvic lymph nodes.  Scattered aortic atherosclerotic calcifications are seen without aneurysmal dilatation. Reproductive: The uterus and adnexa are unremarkable. Other: Small fat containing inguinal hernias are noted. Musculoskeletal: No acute or significant osseous findings. Again noted is prior lumbar spine fixation from L3 through L5 with interbody fusion. IMPRESSION: 1. Edematous appearance to the cecum and terminal ileum. Findings may be infectious, inflammatory, or secondary to hypoalbuminemia. 2. 1.6 cm posterior right liver lobe hyperdense lesion which is nonspecific, and could represent a hemangioma. If further evaluation is required would recommend MRI with contrast. 3.  Aortic Atherosclerosis (ICD10-I70.0). Electronically Signed   By: Prudencio Pair M.D.   On: 02/17/2020 00:26        Scheduled Meds: . aspirin  81 mg Oral BID  . buPROPion  300 mg Oral Daily  . busPIRone  10 mg Oral BID  . clonazePAM  1 mg Oral BID  . diltiazem  120 mg Oral Daily  . DULoxetine  60 mg Oral QHS  . enoxaparin (LOVENOX) injection  40 mg Subcutaneous Daily  . ferrous sulfate  325 mg Oral TID WC  . gabapentin  200 mg Oral QHS  . hydrALAZINE  25 mg Oral BID  . metoprolol tartrate  12.5 mg Oral BID  . polyethylene glycol  17 g Oral BID   Continuous Infusions: . cefTRIAXone (ROCEPHIN)  IV 2 g (02/18/20 1423)  . lactated ringers Stopped (02/18/20 1418)  . metronidazole 500 mg (02/18/20 1700)     Time spent: 25 minutes with over 50% of the time coordinating the patient's care    Harold Hedge, DO [facility] Pager [phone]  Call night coverage person covering after 7pm

## 2020-02-18 NOTE — Progress Notes (Signed)
     Subjective:      Patient reports pain as mild with regards to the knee.  Overall she states that she is feeling better.  We discussed continuing PT to work on ROM and prevent scar tissue from building up and preventing ROM.     Objective:   VITALS:   Vitals:   02/17/20 2101 02/18/20 0539  BP: (!) 165/66 (!) 104/47  Pulse: 78 (!) 50  Resp: 18 18  Temp: 98.3 F (36.8 C) 98.2 F (36.8 C)  SpO2: 94% (!) 89%    Dressing pealing up and being held down with tape Dorsiflexion/Plantar flexion intact Incision: scant drainage No cellulitis present Compartment soft  LABS Recent Labs    02/16/20 2225 02/17/20 0405 02/18/20 0441  HGB 12.3 11.2* 10.0*  HCT 38.4 36.2 32.5*  WBC 29.9* 24.0* 17.7*  PLT 401* 249 285    Recent Labs    02/16/20 2225 02/17/20 0405 02/18/20 0441  NA 136 135 136  K 2.7* 3.2* 3.6  BUN 27* 20 18  CREATININE 1.12* 0.80 0.96  GLUCOSE 125* 105* 132*     Assessment/Plan:      Up with therapy  Continue working with PT for the right TKA Change dressing to a new 10" Aquacel dressing       Danae Orleans PA-C  [facility]  [place] [address], [address], [place], [place] [address] Phone: [phone] [email or web address] Facebook  [employer]

## 2020-02-18 NOTE — Progress Notes (Signed)
OT Cancellation Note  Patient Details Name: WONDRA SCURRY MRN: [id number] DOB: [birth year]   Cancelled Treatment:    Reason Eval/Treat Not Completed: Other (comment)(patient declined secondary to increase pain)  Cabe Lashley OTR/L   Braxson Hollingsworth 02/18/2020, 2:38 PM

## 2020-02-18 NOTE — Progress Notes (Signed)
Physical Therapy Treatment Patient Details Name: [name] MRN: [id number] DOB: Feb 24, 1956 Today's Date: 02/18/2020    History of Present Illness Patient is 64 y.o. female s/p Rt TKA on 02/10/20 with PMH significant for CAD, HTN, fibromyalgia, SVT, HLD, GERD, COPD, OA, anxiety, XLIF L3-5 and posterior spinal fusion (left) L3-5.    PT Comments    POD # 6 readmit for Colitis Assisted with amb then TE's.  Knee flex is good approx 95 degrees.  But knee extension is lacking approx 12 degrees.  Re educated on "keeping leg straight" during long periods of sleeping and sitting.  Performed extra knee presses.  Applied ICE.    Follow Up Recommendations  Follow surgeon's recommendation for DC plan and follow-up therapies;Outpatient PT     Equipment Recommendations  None recommended by PT    Recommendations for Other Services       Precautions / Restrictions Precautions Precautions: Fall;Knee Precaution Comments: reviewed no pillow under knee and encourage knee extension Restrictions Weight Bearing Restrictions: No Other Position/Activity Restrictions: WBAT RLE    Mobility  Bed Mobility               General bed mobility comments: OOB in recliner  Transfers Overall transfer level: Needs assistance Equipment used: Rolling walker (2 wheeled) Transfers: Sit to/from Stand Sit to Stand: Supervision         General transfer comment: one VC safety with turns  Ambulation/Gait Ambulation/Gait assistance: Supervision Gait Distance (Feet): 155 Feet Assistive device: Rolling walker (2 wheeled) Gait Pattern/deviations: Step-to pattern;Trunk flexed;Decreased stride length Gait velocity: decreased   General Gait Details: < 25% VC's on safety with turns   Marine scientist Rankin (Stroke Patients Only)       Balance                                            Cognition Arousal/Alertness: Awake/alert Behavior  During Therapy: WFL for tasks assessed/performed Overall Cognitive Status: Within Functional Limits for tasks assessed                                        Exercises   Total Knee Replacement TE's 10 reps B LE ankle pumps 10 reps towel squeezes 10 reps knee presses 10 reps heel slides  10 reps SAQ's 10 reps SLR's 10 reps ABD Followed by ICE     General Comments        Pertinent Vitals/Pain Pain Assessment: 0-10 Pain Score: 7  Pain Location: R knee Pain Descriptors / Indicators: Grimacing;Guarding;Sore;Aching Pain Intervention(s): Monitored during session;Premedicated before session;Repositioned;Ice applied    Home Living                      Prior Function            PT Goals (current goals can now be found in the care plan section) Progress towards PT goals: Progressing toward goals    Frequency    7X/week      PT Plan Current plan remains appropriate    Co-evaluation              AM-PAC PT "6 Clicks" Mobility   Outcome Measure  Help  needed turning from your back to your side while in a flat bed without using bedrails?: A Little Help needed moving from lying on your back to sitting on the side of a flat bed without using bedrails?: A Little Help needed moving to and from a bed to a chair (including a wheelchair)?: A Little Help needed standing up from a chair using your arms (e.g., wheelchair or bedside chair)?: A Little Help needed to walk in hospital room?: A Little Help needed climbing 3-5 steps with a railing? : A Little 6 Click Score: 18    End of Session Equipment Utilized During Treatment: Gait belt Activity Tolerance: Patient tolerated treatment well Patient left: with chair alarm set;in chair;with call bell/phone within reach Nurse Communication: Mobility status PT Visit Diagnosis: Muscle weakness (generalized) (M62.81);Difficulty in walking, not elsewhere classified (R26.2)     Time: [id number] PT Time  Calculation (min) (ACUTE ONLY): 25 min  Charges:  $Gait Training: 8-22 mins $Therapeutic Exercise: 8-22 mins                     Rica Koyanagi  PTA Acute  Rehabilitation Services Pager      [phone] Office      [phone]

## 2020-02-19 LAB — LIPID PANEL
Cholesterol: 178 mg/dL (ref 0–200)
HDL: 33 mg/dL — ABNORMAL LOW (ref >40)
LDL Cholesterol: 103 mg/dL — ABNORMAL HIGH (ref 0–99)
Total CHOL/HDL Ratio: 5.4 RATIO
Triglycerides: 211 mg/dL — ABNORMAL HIGH (ref <150)
VLDL: 42 mg/dL — ABNORMAL HIGH (ref 0–40)

## 2020-02-19 LAB — BASIC METABOLIC PANEL
Anion gap: 10 (ref 5–15)
BUN: 11 mg/dL (ref 8–23)
CO2: 30 mmol/L (ref 22–32)
Calcium: 8.8 mg/dL — ABNORMAL LOW (ref 8.9–10.3)
Chloride: 95 mmol/L — ABNORMAL LOW (ref 98–111)
Creatinine, Ser: 0.77 mg/dL (ref 0.44–1.00)
GFR calc Af Amer: >60 mL/min (ref >60)
GFR calc non Af Amer: >60 mL/min (ref >60)
Glucose, Bld: 122 mg/dL — ABNORMAL HIGH (ref 70–99)
Potassium: 3.5 mmol/L (ref 3.5–5.1)
Sodium: 135 mmol/L (ref 135–145)

## 2020-02-19 LAB — CBC
HCT: 35.5 % — ABNORMAL LOW (ref 36.0–46.0)
Hemoglobin: 11.2 g/dL — ABNORMAL LOW (ref 12.0–15.0)
MCH: 29.6 pg (ref 26.0–34.0)
MCHC: 31.5 g/dL (ref 30.0–36.0)
MCV: 93.9 fL (ref 80.0–100.0)
Platelets: 311 10*3/uL (ref 150–400)
RBC: 3.78 MIL/uL — ABNORMAL LOW (ref 3.87–5.11)
RDW: 14.6 % (ref 11.5–15.5)
WBC: 16.5 10*3/uL — ABNORMAL HIGH (ref 4.0–10.5)
nRBC: 0 % (ref 0.0–0.2)

## 2020-02-19 LAB — HEMOGLOBIN A1C
Hgb A1c MFr Bld: 5.9 % — ABNORMAL HIGH (ref 4.8–5.6)
Mean Plasma Glucose: 122.63 mg/dL

## 2020-02-19 MED ORDER — EZETIMIBE 10 MG PO TABS
10.0000 mg | ORAL_TABLET | Freq: Every day | ORAL | Status: DC
  Administered 2020-02-19 – 2020-02-20 (×2): 10 mg via ORAL
  Filled 2020-02-19 (×2): qty 1

## 2020-02-19 NOTE — Progress Notes (Signed)
Occupational Therapy Note:  Clinical Impression: Patient has demonstrated progress with active participation of self-care tasks for bathing, dressing and grooming. Overall self-care skill level at Modified Independence to Minimal Assist with extra time for LB dressing. Patient verbalized understanding of demo for shower transfers to carryover in home setting. Pt was too fatigue to provide return demonstration, but verbalized understanding. Patient c/o nausea and upset stomach throughout OT tx session. Notified nursing staff. Patient will benefit from continued skilled acute OT services.    02/19/20 1537  OT Visit Information  Assistance Needed +1  History of Present Illness Patient is 64 y.o. female s/p Rt TKA on 02/10/20 with PMH significant for CAD, HTN, fibromyalgia, SVT, HLD, GERD, COPD, OA, anxiety, XLIF L3-5 and posterior spinal fusion (left) L3-5.  Precautions  Precautions Fall;Knee  Pain Assessment  Pain Assessment 0-10  Cognition  Arousal/Alertness Awake/alert  Behavior During Therapy WFL for tasks assessed/performed  Overall Cognitive Status Within Functional Limits for tasks assessed  ADL  Grooming Set up;Oral care;Wash/dry face;Wash/dry hands  Upper Body Bathing Set up  Lower Body Bathing Supervison/ safety;Set up  Upper Body Dressing  Set up  Lower Body Dressing Minimal assistance  Bed Mobility  Overal bed mobility Modified Independent  Supine to sit Modified independent (Device/Increase time)  Balance  Sitting-balance support Feet supported  Sitting balance-Leahy Scale Good  Standing balance-Leahy Scale Fair  Restrictions  Weight Bearing Restrictions No  Other Position/Activity Restrictions WBAT RLE  Transfers  Overall transfer level Needs assistance  Equipment used Rolling walker (2 wheeled)  Transfers Sit to/from Stand  Sit to Stand Supervision  General transfer comment pt was able to take lateral steps to top of bed with RW at SBA  OT - End of Session   Equipment Utilized During Treatment Rolling walker  Activity Tolerance Patient tolerated treatment well  Patient left in bed;with call bell/phone within reach;with bed alarm set  Nurse Communication Mobility status;Other (comment) (patient request for nausea meds)  OT Assessment/Plan  OT Plan Discharge plan remains appropriate  Follow Up Recommendations Supervision/Assistance - 24 hour  OT Equipment None recommended by OT  AM-PAC OT "6 Clicks" Daily Activity Outcome Measure (Version 2)  Help from another person eating meals? 4  Help from another person taking care of personal grooming? 3  Help from another person toileting, which includes using toliet, bedpan, or urinal? 3  Help from another person bathing (including washing, rinsing, drying)? 3  Help from another person to put on and taking off regular upper body clothing? 3  Help from another person to put on and taking off regular lower body clothing? 3  6 Click Score 19  OT Goal Progression  Progress towards OT goals Progressing toward goals  OT Time Calculation  OT Start Time (ACUTE ONLY) 1455  OT Stop Time (ACUTE ONLY) 1522  OT Time Calculation (min) 27 min  OT General Charges  $OT Visit 1 Visit  OT Treatments  $Self Care/Home Management  23-37 mins   Chelle Cayton OTR/L

## 2020-02-19 NOTE — Progress Notes (Signed)
PROGRESS NOTE    [name]    Code Status: Full Code  FBP:[id number] DOB: 12/25/1955 DOA: 02/16/2020 LOS: 2 days  PCP: Maurice Small, MD CC:  Chief Complaint  Patient presents with  . Abdominal Pain  . Nausea  . Emesis  . [facility] Summary   This is a 64 year old female with history of hypertension, OSA on CPAP, fibromyalgia, GERD, hyperlipidemia CAD, PUD status post recent right total knee arthroplasty (02/10/2020) who was admitted on 3/2 with generalized abdominal pain, nausea, vomiting with fatigue and weakness found to have presumed infectious colitis on CT scan and started on empiric antibiotics as well as IV fluids.  Complained of dark stool with negative Hemoccult thought to be secondary to iron which she recently started taking.  Ortho was consulted for evaluation of her recent surgery.  A & P   Principal Problem:   Colitis presumed infectious Active Problems:   Leukocytosis   OSA on CPAP   Hypertension   Hypokalemia   S/P right TKA   Liver lesion   1. Colitis, presumed infectious a. Got Cipro/Flagyl in ED b. Continue Rocephin/Flagyl for now c. Leukocytosis trending down but has chronically elevated WBC with apparently unremarkable bone marrow biopsy several years ago d. Poor PO intake this morning with nausea e. Zofran as needed for nausea f. Follow-up stool testing 2. Headache a. Likely tension b. Continue current medications 3. Dark stool, likely from p.o. iron a. Hemoccult negative b. Continue to monitor 4. Acute on chronic leukocytosis a. Improving with IV antibiotics b. Bone marrow BX in 2016 apparently unremarkable c. Path review: Leukocytosis with neutrophilia, polychromasia 5. Hypokalemia, resolved 6. Hypertension a. Continue holding HCTZ due to hypokalemia b. Continue other BP meds 7. OSA on CPAP 8. 1.6 cm posterior right hyperdense nonspecific liver lobe lesion a. Consider outpatient MRI with contrast for further  evaluation 9. Hyperlipidemia/Aortic atherosclerosis a. Did not tolerate statins in the past b. Start ezetimibe c. Continue aspirin 10. Recent right total knee arthroplasty (02/10/20) a. Per Ortho: Up with therapy, continue working with PT 11. Prediabetes a. Lifestyle modification 12. Obesity a. Body mass index is 33.93 kg/m.    DVT prophylaxis: lovenox Family Communication: No family at bedside Disposition Plan:   Patient came from:   Home                                                                                          Anticipated d/c place: Home pending PT recs  Barriers to d/c: Still requiring IV antibiotics. Poor PO intake today with nausea, she can discharge with oral antibiotics and outpatient follow up once she has improved PO intake, hopefully tomorrow   Pressure injury documentation    None  Consultants  Ortho  Procedures  None  Antibiotics   Anti-infectives (From admission, onward)   Start     Dose/Rate Route Frequency Ordered Stop   02/17/20 1000  cefTRIAXone (ROCEPHIN) 2 g in sodium chloride 0.9 % 100 mL IVPB     2 g 200 mL/hr over 30 Minutes Intravenous Every 24 hours 02/17/20 0101  02/17/20 0900  metroNIDAZOLE (FLAGYL) IVPB 500 mg     500 mg 100 mL/hr over 60 Minutes Intravenous Every 8 hours 02/17/20 0101     02/17/20 0045  ciprofloxacin (CIPRO) IVPB 400 mg     400 mg 200 mL/hr over 60 Minutes Intravenous  Once 02/17/20 0033 02/17/20 0200   02/17/20 0045  metroNIDAZOLE (FLAGYL) IVPB 500 mg     500 mg 100 mL/hr over 60 Minutes Intravenous  Once 02/17/20 0033 02/17/20 1900        Subjective   Complaining that she woke up with a right sided headache, similar to headaches in the past. Also had nausea after smelling breakfast this morning. Denied any phonophobia, photophobia, scotomas or other red flags. No vomiting. Had just received Zofran. No other complaints  Objective   Vitals:   02/18/20 1248 02/18/20 2030 02/19/20 0431 02/19/20  1100  BP: (!) 175/54 (!) 157/60 (!) 150/70 (!) 129/55  Pulse: (!) 55 72 87 71  Resp: 18 17    Temp: 98 F (36.7 C) 98.4 F (36.9 C) 98.1 F (36.7 C) 97.7 F (36.5 C)  TempSrc: Oral Oral Oral Oral  SpO2: 98% 93%    Weight:      Height:        Intake/Output Summary (Last 24 hours) at 02/19/2020 1326 Last data filed at 02/18/2020 1642 Gross per 24 hour  Intake 893.96 ml  Output --  Net 893.96 ml   Filed Weights   02/17/20 0417  Weight: 76.2 kg    Examination:  Physical Exam Vitals and nursing note reviewed. Exam conducted with a chaperone present.  HENT:     Head: Normocephalic.  Cardiovascular:     Rate and Rhythm: Normal rate and regular rhythm.  Pulmonary:     Effort: Pulmonary effort is normal.     Breath sounds: Normal breath sounds.  Abdominal:     General: Bowel sounds are normal. There is no distension.     Palpations: Abdomen is soft.     Tenderness: There is no abdominal tenderness.  Skin:    Coloration: Skin is not pale.  Neurological:     General: No focal deficit present.     Mental Status: She is alert.  Psychiatric:        Mood and Affect: Mood normal.        Behavior: Behavior normal.     Data Reviewed: I have personally reviewed following labs and imaging studies  CBC: Recent Labs  Lab 02/16/20 2225 02/17/20 0405 02/18/20 0441 02/19/20 0456  WBC 29.9* 24.0* 17.7* 16.5*  NEUTROABS  --  18.4*  --   --   HGB 12.3 11.2* 10.0* 11.2*  HCT 38.4 36.2 32.5* 35.5*  MCV 94.3 95.5 97.0 93.9  PLT 401* 249 285 242   Basic Metabolic Panel: Recent Labs  Lab 02/16/20 2225 02/17/20 0405 02/18/20 0441 02/19/20 0456  NA 136 135 136 135  K 2.7* 3.2* 3.6 3.5  CL 91* 93* 99 95*  CO2 34* '28 29 30  '$ GLUCOSE 125* 105* 132* 122*  BUN 27* '20 18 11  '$ CREATININE 1.12* 0.80 0.96 0.77  CALCIUM 8.9 8.8* 8.4* 8.8*   GFR: Estimated Creatinine Clearance: 61.6 mL/min (by C-G formula based on SCr of 0.77 mg/dL). Liver Function Tests: Recent Labs  Lab  02/16/20 2225 02/18/20 0441  AST 35 26  ALT 32 23  ALKPHOS 98 67  BILITOT 0.8 0.5  PROT 7.1 5.3*  ALBUMIN 3.9 2.8*   Recent Labs  Lab 02/16/20 2226  LIPASE 31   No results for input(s): AMMONIA in the last 168 hours. Coagulation Profile: No results for input(s): INR, PROTIME in the last 168 hours. Cardiac Enzymes: No results for input(s): CKTOTAL, CKMB, CKMBINDEX, TROPONINI in the last 168 hours. BNP (last 3 results) No results for input(s): PROBNP in the last 8760 hours. HbA1C: Recent Labs    02/19/20 0456  HGBA1C 5.9*   CBG: No results for input(s): GLUCAP in the last 168 hours. Lipid Profile: Recent Labs    02/19/20 0456  CHOL 178  HDL 33*  LDLCALC 103*  TRIG 211*  CHOLHDL 5.4   Thyroid Function Tests: No results for input(s): TSH, T4TOTAL, FREET4, T3FREE, THYROIDAB in the last 72 hours. Anemia Panel: No results for input(s): VITAMINB12, FOLATE, FERRITIN, TIBC, IRON, RETICCTPCT in the last 72 hours. Sepsis Labs: No results for input(s): PROCALCITON, LATICACIDVEN in the last 168 hours.  Recent Results (from the past 240 hour(s))  SARS Coronavirus 2 by RT PCR     Status: None   Collection Time: 02/17/20 12:34 AM  Result Value Ref Range Status   SARS Coronavirus 2 NEGATIVE NEGATIVE Final    Comment: (NOTE) Result indicates the ABSENCE of SARS-CoV-2 RNA in the patient specimen.  The lowest concentration of SARS-CoV-2 viral copies this assay can detect in nasopharyngeal swab specimens is 500 copies / mL.  A negative result does not preclude SARS-CoV-2 infection and should not be used as the sole basis for patient management decisions. A negative result may occur with improper specimen collection / handling, submission of a specimen other than nasopharyngeal swab, presence of viral mutation(s) within the areas targeted by this assay, and inadequate number of viral copies (<500 copies / mL) present.  Negative results must be combined with  clinical observations, patient history, and epidemiological information.  The expected result is NEGATIVE.  Patient Fact Sheet:  BlogSelections.co.uk   Provider Fact Sheet:  https://lucas.com/   This test is not yet approved or cleared by the [place] FDA and  has been authorized for  detection and/or diagnosis of SARS-CoV-2 by FDA under an Emergency Use Authorization (EUA).  This EUA will remain in effect (meaning this test can be used) for the duration of  the COVID-19 declaration under Section 564(b)(1) of the Act, 21 U.S.C. section 360bbb-3(b)(1), unless the authorization is terminated or revoked sooner Performed at [facility], [place] [address] [place], [place] [address]          Radiology Studies: No results found.      Scheduled Meds: . aspirin  81 mg Oral BID  . buPROPion  300 mg Oral Daily  . busPIRone  10 mg Oral BID  . clonazePAM  1 mg Oral BID  . diltiazem  120 mg Oral Daily  . DULoxetine  60 mg Oral QHS  . enoxaparin (LOVENOX) injection  40 mg Subcutaneous Daily  . ezetimibe  10 mg Oral Daily  . ferrous sulfate  325 mg Oral TID WC  . gabapentin  200 mg Oral QHS  . hydrALAZINE  25 mg Oral BID  . metoprolol tartrate  12.5 mg Oral BID  . polyethylene glycol  17 g Oral BID   Continuous Infusions: . cefTRIAXone (ROCEPHIN)  IV 2 g (02/19/20 1010)  . metronidazole 500 mg (02/19/20 0751)     Time spent: 26 minutes with over 50% of the time coordinating the patient's care    Harold Hedge, [place] Pager [phone]  Call night coverage person covering after 7pm

## 2020-02-19 NOTE — Progress Notes (Signed)
Physical Therapy Treatment Patient Details Name: [name] MRN: [id number] DOB: 1956-07-11 Today's Date: 02/19/2020    History of Present Illness Patient is 64 y.o. female s/p Rt TKA on 02/10/20 with PMH significant for CAD, HTN, fibromyalgia, SVT, HLD, GERD, COPD, OA, anxiety, XLIF L3-5 and posterior spinal fusion (left) L3-5.    PT Comments    Pt reports she had been performing exercises today.  Pt gently reminded to keep performing knee extension and keeping knee straight at rest.  Pt assisted to [facility] and then ambulated in hallway however limited by UE fatigue.  Pt assisted back to bed per request, reports abdomen has been bothering her today.   Follow Up Recommendations  Follow surgeon's recommendation for DC plan and follow-up therapies;Outpatient PT     Equipment Recommendations  None recommended by PT    Recommendations for Other Services       Precautions / Restrictions Precautions Precautions: Fall;Knee Precaution Comments: reviewed no pillow under knee and encourage knee extension Restrictions Weight Bearing Restrictions: No Other Position/Activity Restrictions: WBAT RLE    Mobility  Bed Mobility Overal bed mobility: Modified Independent       Supine to sit: Modified independent (Device/Increase time)     General bed mobility comments: OOB in recliner  Transfers Overall transfer level: Needs assistance Equipment used: Rolling walker (2 wheeled) Transfers: Sit to/from Stand Sit to Stand: Supervision         General transfer comment: supervision for safety, used BSC prior to ambulating  Ambulation/Gait Ambulation/Gait assistance: Supervision Gait Distance (Feet): 70 Feet Assistive device: Rolling walker (2 wheeled) Gait Pattern/deviations: Trunk flexed;Decreased stride length;Step-through pattern Gait velocity: decreased   General Gait Details: verbal cues for posture, pt reports UEs fatigue quickly due to high RW (youth RW brought to room),  required chair and brought back to room   Stairs             Wheelchair Mobility    Modified Rankin (Stroke Patients Only)       Balance   Sitting-balance support: Feet supported Sitting balance-Leahy Scale: Good       Standing balance-Leahy Scale: Fair                              Cognition Arousal/Alertness: Awake/alert Behavior During Therapy: WFL for tasks assessed/performed Overall Cognitive Status: Within Functional Limits for tasks assessed                                        Exercises      General Comments        Pertinent Vitals/Pain Pain Assessment: 0-10 Pain Score: 5  Pain Location: abdomen Pain Descriptors / Indicators: Grimacing;Guarding;Sore;Aching Pain Intervention(s): Monitored during session;Repositioned    Home Living                      Prior Function            PT Goals (current goals can now be found in the care plan section) Progress towards PT goals: Progressing toward goals    Frequency    Min 5X/week      PT Plan Current plan remains appropriate    Co-evaluation              AM-PAC PT "6 Clicks" Mobility   Outcome Measure  Help needed turning  from your back to your side while in a flat bed without using bedrails?: A Little Help needed moving from lying on your back to sitting on the side of a flat bed without using bedrails?: A Little Help needed moving to and from a bed to a chair (including a wheelchair)?: A Little Help needed standing up from a chair using your arms (e.g., wheelchair or bedside chair)?: A Little Help needed to walk in hospital room?: A Little Help needed climbing 3-5 steps with a railing? : A Little 6 Click Score: 18    End of Session Equipment Utilized During Treatment: Gait belt Activity Tolerance: Patient tolerated treatment well Patient left: in bed;with call bell/phone within reach Nurse Communication: Mobility status PT Visit  Diagnosis: Muscle weakness (generalized) (M62.81);Difficulty in walking, not elsewhere classified (R26.2)     Time: [id number] PT Time Calculation (min) (ACUTE ONLY): 26 min  Charges:  $Gait Training: 8-22 mins                     Arlyce Dice, DPT Acute Rehabilitation Services Office: [phone]   Trena Platt 02/19/2020, 4:13 PM

## 2020-02-19 NOTE — Progress Notes (Signed)
OT Cancellation Note  Patient Details Name: [name] MRN: [id number] DOB: Dec 20, 1955   Cancelled Treatment:    Reason Eval/Treat Not Completed: Other (comment)(Patient declined due to headache and nausea pain ) Notified Nursing Staff. Will attempt treatment in pm if time permits.   Inola Lisle OTR/L  Modestine Scherzinger 02/19/2020, 9:02 AM

## 2020-02-20 LAB — BASIC METABOLIC PANEL
Anion gap: 10 (ref 5–15)
BUN: 11 mg/dL (ref 8–23)
CO2: 27 mmol/L (ref 22–32)
Calcium: 8.9 mg/dL (ref 8.9–10.3)
Chloride: 97 mmol/L — ABNORMAL LOW (ref 98–111)
Creatinine, Ser: 0.83 mg/dL (ref 0.44–1.00)
GFR calc Af Amer: >60 mL/min (ref >60)
GFR calc non Af Amer: >60 mL/min (ref >60)
Glucose, Bld: 118 mg/dL — ABNORMAL HIGH (ref 70–99)
Potassium: 3.4 mmol/L — ABNORMAL LOW (ref 3.5–5.1)
Sodium: 134 mmol/L — ABNORMAL LOW (ref 135–145)

## 2020-02-20 LAB — CBC
HCT: 36.6 % (ref 36.0–46.0)
Hemoglobin: 11.5 g/dL — ABNORMAL LOW (ref 12.0–15.0)
MCH: 29.8 pg (ref 26.0–34.0)
MCHC: 31.4 g/dL (ref 30.0–36.0)
MCV: 94.8 fL (ref 80.0–100.0)
Platelets: 324 10*3/uL (ref 150–400)
RBC: 3.86 MIL/uL — ABNORMAL LOW (ref 3.87–5.11)
RDW: 14.9 % (ref 11.5–15.5)
WBC: 22.1 10*3/uL — ABNORMAL HIGH (ref 4.0–10.5)
nRBC: 0 % (ref 0.0–0.2)

## 2020-02-20 MED ORDER — AMOXICILLIN-POT CLAVULANATE 875-125 MG PO TABS: 1.0000 | ORAL_TABLET | Freq: Two times a day (BID) | ORAL | 0 refills | Status: AC

## 2020-02-20 MED ORDER — POTASSIUM CHLORIDE CRYS ER 20 MEQ PO TBCR
20.0000 meq | EXTENDED_RELEASE_TABLET | Freq: Once | ORAL | Status: AC
  Administered 2020-02-20: 15:00:00 20 meq via ORAL
  Filled 2020-02-20: qty 1

## 2020-02-20 MED ORDER — EZETIMIBE 10 MG PO TABS: 10.0000 mg | ORAL_TABLET | Freq: Every day | ORAL | 0 refills | Status: DC

## 2020-02-20 MED ORDER — PANTOPRAZOLE SODIUM 40 MG PO TBEC: 40.0000 mg | DELAYED_RELEASE_TABLET | Freq: Two times a day (BID) | ORAL | 0 refills | Status: AC

## 2020-02-20 NOTE — Plan of Care (Signed)

## 2020-02-20 NOTE — Care Management Important Message (Signed)
Important Message  Patient Details IM Letter given to Dessa Phi RN Case Manager to present to the Patient Name: [name] MRN: [id number] Date of Birth: April 15, 1956   Medicare Important Message Given:  Yes     Kerin Salen 02/20/2020, 12:10 PM

## 2020-02-20 NOTE — Progress Notes (Signed)
Physical Therapy Treatment Patient Details Name: [name] MRN: [id number] DOB: 06/24/1956 Today's Date: 02/20/2020    History of Present Illness Patient is 64 y.o. female s/p Rt TKA on 02/10/20 with PMH significant for CAD, HTN, fibromyalgia, SVT, HLD, GERD, COPD, OA, anxiety, XLIF L3-5 and posterior spinal fusion (left) L3-5.    PT Comments    Assisted OOB to [facility].  Pt self able to perform safe transfer and self peri care.  Applied new brief.  Assissted with amb in hallway.  Then returned to room to perform some TE's following HEP handout.  Instructed on proper tech, freq as well as use of ICE.   Addressed all mobility questions, discussed appropriate activity, educated on use of ICE.  Pt ready for D/C to home.   Follow Up Recommendations  Follow surgeon's recommendation for DC plan and follow-up therapies;Outpatient PT     Equipment Recommendations  None recommended by PT    Recommendations for Other Services       Precautions / Restrictions Precautions Precautions: Fall;Knee Precaution Comments: reviewed no pillow under knee and encourage knee extension Restrictions Weight Bearing Restrictions: No Other Position/Activity Restrictions: WBAT RLE    Mobility  Bed Mobility Overal bed mobility: Modified Independent       Supine to sit: Modified independent (Device/Increase time)     General bed mobility comments: increased time  Transfers Overall transfer level: Needs assistance Equipment used: Rolling walker (2 wheeled) Transfers: Sit to/from Stand Sit to Stand: Supervision         General transfer comment: supervision for safety, used BSC prior to ambulating  Ambulation/Gait Ambulation/Gait assistance: Supervision Gait Distance (Feet): 115 Feet Assistive device: Rolling walker (2 wheeled) Gait Pattern/deviations: Trunk flexed;Decreased stride length;Step-through pattern Gait velocity: decreased   General Gait Details: tolerated a functional  distance   Stairs             Wheelchair Mobility    Modified Rankin (Stroke Patients Only)       Balance                                            Cognition Arousal/Alertness: Awake/alert Behavior During Therapy: WFL for tasks assessed/performed Overall Cognitive Status: Within Functional Limits for tasks assessed                                        Exercises   Total Knee Replacement TE's 10 reps B LE ankle pumps 10 reps towel squeezes 10 reps knee presses 10 reps heel slides  10 reps SAQ's 10 reps SLR's 10 reps ABD Followed by ICE     General Comments        Pertinent Vitals/Pain Pain Assessment: No/denies pain    Home Living                      Prior Function            PT Goals (current goals can now be found in the care plan section) Progress towards PT goals: Progressing toward goals    Frequency    Min 5X/week      PT Plan Current plan remains appropriate    Co-evaluation              AM-PAC PT "6  Clicks" Mobility   Outcome Measure  Help needed turning from your back to your side while in a flat bed without using bedrails?: None Help needed moving from lying on your back to sitting on the side of a flat bed without using bedrails?: None Help needed moving to and from a bed to a chair (including a wheelchair)?: None Help needed standing up from a chair using your arms (e.g., wheelchair or bedside chair)?: None Help needed to walk in hospital room?: A Little Help needed climbing 3-5 steps with a railing? : A Little 6 Click Score: 22    End of Session Equipment Utilized During Treatment: Gait belt Activity Tolerance: Patient tolerated treatment well Patient left: with call bell/phone within reach;in chair;with chair alarm set Nurse Communication: Mobility status PT Visit Diagnosis: Muscle weakness (generalized) (M62.81);Difficulty in walking, not elsewhere classified  (R26.2)     Time: [id number] PT Time Calculation (min) (ACUTE ONLY): 27 min  Charges:  $Gait Training: 8-22 mins $Therapeutic Exercise: 8-22 mins                     Rica Koyanagi  PTA Acute  Rehabilitation Services Pager      [phone] Office      [phone]

## 2020-02-20 NOTE — Discharge Summary (Signed)
Physician Discharge Summary  [name] [id number] DOB: 22-Oct-1956   PCP: Maurice Small, MD  Admit date: 02/16/2020 Discharge date: 02/20/2020 Length of Stay: 3 days   Code Status: Full Code  Admitted From:  Home Discharged to:   [place]:  None  Equipment/Devices:  None Discharge Condition:  Stable  Recommendations for Outpatient Follow-up   1. Follow up with PCP in 1 week 2. Follow up BMP to check potassium/CBC to follow-up WBC 3. Held HCTZ due to hypokalemia 4. Recommended to follow-up with GI in the next 2 weeks to 1 month 5. Discharged on 2 more days of Augmentin to complete 7 days of antibiotics for colitis 6. Started on ezetimibe 7. Needs ongoing lifestyle/risk factor modification (obesity, tobacco use, poor diet, atherosclerosis, etc.), her chronic abdominal pain might be due to chronic ischemic colitis (however, radiologist did not find significant findings on CT abdomen/pelvis with contrast) 8. Consider outpatient work-up for chronic leukocytosis 9. Continue with palpation PT 10. Routine follow-up with Ortho 11. Consider hepatic MRI  Hospital Summary  This is a 64 year old female with history of hypertension, OSA on CPAP, fibromyalgia, GERD, hyperlipidemia CAD, PUD status post recent right total knee arthroplasty (02/10/2020) who was admitted on 3/2 with generalized abdominal pain, nausea, vomiting with fatigue and weakness found to have presumed infectious colitis on CT scan and started on empiric antibiotics as well as IV fluids.  Complained of dark stool with negative Hemoccult thought to be secondary to iron which she recently started taking.  Ortho was consulted for follow-up of her recent surgery.  She had improvement in her nausea/vomiting and RLQ pain.  However, she had epigastric discomfort after eating meals had some loose bowel movements although she was getting MiraLAX.  After further discussion, this was found to be her chronic issues and not  acute.  Found to have aortic atherosclerosis, adverse reaction to statins in the past.  Started on ezetimibe which should have outpatient follow-up  She was recommended to follow a bland diet, consider FODMAP diet, follow-up with GI outpatient and continue Augmentin x2 more days for total 7 days antibiotics.  A & P   Principal Problem:   Colitis presumed infectious Active Problems:   Leukocytosis   OSA on CPAP   Hypertension   Hypokalemia   S/P right TKA   Liver lesion   1. Colitis, presumed infectious  a. Got Cipro/Flagyl in ED, then Rocephin/Flagyl while hospitalized b. Leukocytosis initially trended downward however returned to baseline chronic elevated leukocytosis at discharge -needs follow-up outpatient consider further work-up c. Stool studies obtained but were sent out and have not come back yet.  Should come back on 3/8 and need further follow-up. d. unlikely C. difficile 2. Chronic epigastric abdominal pain with meals a. Aortic atherosclerosis on CT with contrast abdomen/pelvis.  Discussed with radiology to review scan but did not find any overt findings of ischemic colitis.  She does have multiple risk factors and chronic abdominal symptoms, which seem to fit chronic ischemic colitis.  Needs risk factor modification and further outpatient work-up 3. Tension headache, resolved 4. Dark stool, likely from p.o. iron a. Hemoccult negative b. Continue to monitor 5. Acute on chronic leukocytosis a. Improved initially with IV antibiotics, then trended back to 22 at discharge however afebrile and hemodynamically stable on room air without acute abdominal issue b. Bone marrow BX in 2016 apparently unremarkable c. Path review: Leukocytosis with neutrophilia, polychromasia d. Consider outpatient work-up 6. Hypokalemia, repleted.  Hold HCTZ at discharge  7. Hypertension a. Continue holding HCTZ due to hypokalemia b. Continue other BP meds 8. OSA on CPAP 9. 1.6 cm posterior right  hyperdense nonspecific liver lobe lesion a. Consider outpatient MRI with contrast for further evaluation 10. Hyperlipidemia/Aortic atherosclerosis a. Did not tolerate statins in the past b. Start ezetimibe c. Continue aspirin 11. Recent right total knee arthroplasty (02/10/20) a. Continue aspirin b. Continue working with outpatient PT and follow-up with Ortho 12. Prediabetes a. Lifestyle modification 13. Obesity a. Body mass index is 33.93 kg/m b. Dietary consulted    Consultants  . Ortho  Procedures  . None  Antibiotics   Anti-infectives (From admission, onward)   Start     Dose/Rate Route Frequency Ordered Stop   02/20/20 0000  amoxicillin-clavulanate (AUGMENTIN) 875-125 MG tablet     1 tablet Oral 2 times daily 02/20/20 1346 02/22/20 2359   02/17/20 1000  cefTRIAXone (ROCEPHIN) 2 g in sodium chloride 0.9 % 100 mL IVPB     2 g 200 mL/hr over 30 Minutes Intravenous Every 24 hours 02/17/20 0101     02/17/20 0900  metroNIDAZOLE (FLAGYL) IVPB 500 mg     500 mg 100 mL/hr over 60 Minutes Intravenous Every 8 hours 02/17/20 0101     02/17/20 0045  ciprofloxacin (CIPRO) IVPB 400 mg     400 mg 200 mL/hr over 60 Minutes Intravenous  Once 02/17/20 0033 02/17/20 0200   02/17/20 0045  metroNIDAZOLE (FLAGYL) IVPB 500 mg     500 mg 100 mL/hr over 60 Minutes Intravenous  Once 02/17/20 0033 02/17/20 1900       Subjective  Seen and examined at bedside no acute distress resting comfortably.  She stated she had some epigastric discomfort after eating a meal last night which consisted of pot roast and other heavy/fatty food.  This had resolved soon after.  She was advised to refrain from such a heavy meal at lunch.  She was followed up today after lunch but again had recurrent symptoms.  Also had a loose BM but had been receiving MiraLAX over the past several days (did not get any today).  After further discussion with the patient she states that she has chronic epigastric pain after eating  and also has chronic loose bowel movements and the symptoms are similar to her baseline.  She is close outpatient follow-up labs.  Denies any fever, chills, nausea, pain, right lower quadrant pain.  No other issues, no bloody bowel movements.  Objective   Discharge Exam: Vitals:   02/19/20 2152 02/20/20 0619  BP: 116/63 (!) 145/56  Pulse: 79 (!) 48  Resp: 18 18  Temp: 98.7 F (37.1 C) 98.4 F (36.9 C)  SpO2: 90% 91%   Vitals:   02/19/20 0431 02/19/20 1100 02/19/20 2152 02/20/20 0619  BP: (!) 150/70 (!) 129/55 116/63 (!) 145/56  Pulse: 87 71 79 (!) 48  Resp:   18 18  Temp: 98.1 F (36.7 C) 97.7 F (36.5 C) 98.7 F (37.1 C) 98.4 F (36.9 C)  TempSrc: Oral Oral Oral Oral  SpO2:   90% 91%  Weight:      Height:        Physical Exam Vitals and nursing note reviewed.  Constitutional:      Appearance: Normal appearance. She is obese.  HENT:     Head: Normocephalic and atraumatic.  Eyes:     Conjunctiva/sclera: Conjunctivae normal.  Cardiovascular:     Rate and Rhythm: Normal rate and regular rhythm.  Pulmonary:  Effort: Pulmonary effort is normal.     Breath sounds: Normal breath sounds.  Abdominal:     General: Abdomen is flat. Bowel sounds are normal.     Palpations: Abdomen is soft.     Comments: Mild epigastric discomfort with palpation  Musculoskeletal:        General: No swelling or tenderness.  Skin:    Coloration: Skin is not jaundiced or pale.  Neurological:     General: No focal deficit present.     Mental Status: She is alert. Mental status is at baseline.  Psychiatric:        Mood and Affect: Mood normal.        Behavior: Behavior normal.       The results of significant diagnostics from this hospitalization (including imaging, microbiology, ancillary and laboratory) are listed below for reference.     Microbiology: Recent Results (from the past 240 hour(s))  SARS Coronavirus 2 by RT PCR     Status: None   Collection Time: 02/17/20 12:34 AM   Result Value Ref Range Status   SARS Coronavirus 2 NEGATIVE NEGATIVE Final    Comment: (NOTE) Result indicates the ABSENCE of SARS-CoV-2 RNA in the patient specimen.  The lowest concentration of SARS-CoV-2 viral copies this assay can detect in nasopharyngeal swab specimens is 500 copies / mL.  A negative result does not preclude SARS-CoV-2 infection and should not be used as the sole basis for patient management decisions. A negative result may occur with improper specimen collection / handling, submission of a specimen other than nasopharyngeal swab, presence of viral mutation(s) within the areas targeted by this assay, and inadequate number of viral copies (<500 copies / mL) present.  Negative results must be combined with clinical observations, patient history, and epidemiological information.  The expected result is NEGATIVE.  Patient Fact Sheet:  BlogSelections.co.uk   Provider Fact Sheet:  https://lucas.com/   This test is not yet approved or cleared by the [place] FDA and  has been authorized for  detection and/or diagnosis of SARS-CoV-2 by FDA under an Emergency Use Authorization (EUA).  This EUA will remain in effect (meaning this test can be used) for the duration of  the COVID-19 declaration under Section 564(b)(1) of the Act, 21 U.S.C. section 360bbb-3(b)(1), unless the authorization is terminated or revoked sooner Performed at [facility], [place] [address], [place], [place] [address]      Labs: BNP (last 3 results) Recent Labs    08/02/19 0600  BNP 99991111   Basic Metabolic Panel: Recent Labs  Lab 02/16/20 2225 02/17/20 0405 02/18/20 0441 02/19/20 0456 02/20/20 0555  NA 136 135 136 135 134*  K 2.7* 3.2* 3.6 3.5 3.4*  CL 91* 93* 99 95* 97*  CO2 34* 28 29 30 27   GLUCOSE 125* 105* 132* 122* 118*  BUN 27* 20 18 11 11   CREATININE 1.12* 0.80 0.96 0.77 0.83  CALCIUM 8.9 8.8* 8.4* 8.8* 8.9   Liver  Function Tests: Recent Labs  Lab 02/16/20 2225 02/18/20 0441  AST 35 26  ALT 32 23  ALKPHOS 98 67  BILITOT 0.8 0.5  PROT 7.1 5.3*  ALBUMIN 3.9 2.8*   Recent Labs  Lab 02/16/20 2226  LIPASE 31   No results for input(s): AMMONIA in the last 168 hours. CBC: Recent Labs  Lab 02/16/20 2225 02/17/20 0405 02/18/20 0441 02/19/20 0456 02/20/20 0555  WBC 29.9* 24.0* 17.7* 16.5* 22.1*  NEUTROABS  --  18.4*  --   --   --  HGB 12.3 11.2* 10.0* 11.2* 11.5*  HCT 38.4 36.2 32.5* 35.5* 36.6  MCV 94.3 95.5 97.0 93.9 94.8  PLT 401* 249 285 311 324   Cardiac Enzymes: No results for input(s): CKTOTAL, CKMB, CKMBINDEX, TROPONINI in the last 168 hours. BNP: Invalid input(s): POCBNP CBG: No results for input(s): GLUCAP in the last 168 hours. D-Dimer No results for input(s): DDIMER in the last 72 hours. Hgb A1c Recent Labs    02/19/20 0456  HGBA1C 5.9*   Lipid Profile Recent Labs    02/19/20 0456  CHOL 178  HDL 33*  LDLCALC 103*  TRIG 211*  CHOLHDL 5.4   Thyroid function studies No results for input(s): TSH, T4TOTAL, T3FREE, THYROIDAB in the last 72 hours.  Invalid input(s): FREET3 Anemia work up No results for input(s): VITAMINB12, FOLATE, FERRITIN, TIBC, IRON, RETICCTPCT in the last 72 hours. Urinalysis    Component Value Date/Time   COLORURINE AMBER (A) 02/24/2017 1917   APPEARANCEUR CLOUDY (A) 02/24/2017 1917   LABSPEC 1.020 02/24/2017 1917   PHURINE 5.0 02/24/2017 1917   GLUCOSEU NEGATIVE 02/24/2017 [place] 02/24/2017 [place] NEGATIVE 02/24/2017 1917   KETONESUR 5 (A) 02/24/2017 1917   PROTEINUR NEGATIVE 02/24/2017 1917   UROBILINOGEN 0.2 09/17/2008 2342   NITRITE NEGATIVE 02/24/2017 1917   LEUKOCYTESUR TRACE (A) 02/24/2017 1917   Sepsis Labs Invalid input(s): PROCALCITONIN,  WBC,  LACTICIDVEN Microbiology Recent Results (from the past 240 hour(s))  SARS Coronavirus 2 by RT PCR     Status: None   Collection Time: 02/17/20 12:34  AM  Result Value Ref Range Status   SARS Coronavirus 2 NEGATIVE NEGATIVE Final    Comment: (NOTE) Result indicates the ABSENCE of SARS-CoV-2 RNA in the patient specimen.  The lowest concentration of SARS-CoV-2 viral copies this assay can detect in nasopharyngeal swab specimens is 500 copies / mL.  A negative result does not preclude SARS-CoV-2 infection and should not be used as the sole basis for patient management decisions. A negative result may occur with improper specimen collection / handling, submission of a specimen other than nasopharyngeal swab, presence of viral mutation(s) within the areas targeted by this assay, and inadequate number of viral copies (<500 copies / mL) present.  Negative results must be combined with clinical observations, patient history, and epidemiological information.  The expected result is NEGATIVE.  Patient Fact Sheet:  BlogSelections.co.uk   Provider Fact Sheet:  https://lucas.com/   This test is not yet approved or cleared by the [place] FDA and  has been authorized for  detection and/or diagnosis of SARS-CoV-2 by FDA under an Emergency Use Authorization (EUA).  This EUA will remain in effect (meaning this test can be used) for the duration of  the COVID-19 declaration under Section 564(b)(1) of the Act, 21 U.S.C. section 360bbb-3(b)(1), unless the authorization is terminated or revoked sooner Performed at [facility], [place] [address] [place], [place] [address]     Discharge Instructions     Discharge Instructions    Diet - low sodium heart healthy   Complete by: As directed    Discharge instructions   Complete by: As directed    You were seen and examined in the hospital for colitis and cared for by a hospitalist.   Upon Discharge:  -Take Augmentin twice daily for the next 2 days -Take Protonix twice daily for the next month -Start taking ezetimibe daily for your  cholesterol, consider starting on a statin medication -Follow-up with  your gastroenterologist regarding your chronic abdominal pain -If you have worsening diarrhea and/or abdominal pain contact your PCP or return to the ED Make an appointment with your primary care physician within 7 days Get lab work prior to your follow up appointment with your PCP Bring all home medications to your appointment to review Request that your primary physician go over all hospital tests and procedures/radiological results at the follow up.   Please get all hospital records sent to your physician by signing a hospital release before you go home.   Read the complete instructions along with all the possible side effects for all the medicines you take and that have been prescribed to you. Take any new medicines after you have completely understood and accept all the possible adverse reactions/side effects.   If you have any questions about your discharge medications or the care you received while you were in the hospital, you can call the unit and asked to speak with the hospitalist on call. Once you are discharged, your primary care physician will handle any further medical issues. Please note that NO REFILLS for any discharge medications will be authorized, as it is imperative that you return to your primary care physician (or establish a relationship with a primary care physician if you do not have one) for your aftercare needs so that they can reassess your need for medications and monitor your lab values.   Do not drive, operate heavy machinery, perform activities at heights, swimming or participation in water activities or provide baby sitting services if your were admitted for loss of consciousness/seizures or if you are on sedating medications including, but not limited to benzodiazepines, sleep medications, narcotic pain medications, etc., until you have been cleared to do so by a medical doctor.   Do not take  more than prescribed medications.   Wear a seat belt while driving.  If you have smoked or chewed Tobacco in the last 2 years please stop smoking; also stop any regular Alcohol and/or any Recreational drug use including marijuana.  If you experience worsening of your admission symptoms or develop shortness of breath, chest pain, suicidal or homicidal thoughts or experience a life threatening emergency, you must seek medical attention immediately by calling 911 or calling your PCP immediately.   Increase activity slowly   Complete by: As directed      Allergies as of 02/20/2020      Reactions   Midazolam Hcl Other (See Comments)   HYPER   Prednisone Other (See Comments)   HYPER, depressed, moody   Statins Other (See Comments)   Causes muscle weakness and joint pain   Ace Inhibitors Other (See Comments)   DECREASES HEARTRATE   Amoxicillin Diarrhea   Did it involve swelling of the face/tongue/throat, SOB, or low BP? No Did it involve sudden or severe rash/hives, skin peeling, or any reaction on the inside of your mouth or nose? No Did you need to seek medical attention at a hospital or doctor's office? No When did it last happen?~2019 or so If all above answers are "NO", may proceed with cephalosporin use.   Nsaids Other (See Comments)   AVOIDS; HX GASTRIC ULCER   Sulfa Antibiotics Rash, Other (See Comments)   JOINT PAIN      Medication List    STOP taking these medications   hydrochlorothiazide 25 MG tablet Commonly known as: HYDRODIURIL     TAKE these medications   acetaminophen 500 MG tablet Commonly known as: TYLENOL Take 2  tablets (1,000 mg total) by mouth every 8 (eight) hours.   albuterol 108 (90 Base) MCG/ACT inhaler Commonly known as: VENTOLIN HFA Inhale 2 puffs into the lungs every 6 (six) hours as needed for wheezing or shortness of breath.   amoxicillin-clavulanate 875-125 MG tablet Commonly known as: Augmentin Take 1 tablet by mouth 2 (two) times daily  for 2 days.   aspirin 81 MG chewable tablet Commonly known as: Aspirin Childrens Chew 1 tablet (81 mg total) by mouth 2 (two) times daily. Take for 4 weeks, then resume regular dose.   buPROPion 300 MG 24 hr tablet Commonly known as: WELLBUTRIN XL Take 300 mg by mouth daily.   busPIRone 10 MG tablet Commonly known as: BUSPAR Take 10 mg by mouth 2 (two) times daily.   clonazePAM 1 MG tablet Commonly known as: KLONOPIN Take 1 tablet (1 mg total) by mouth at bedtime. What changed: when to take this   diltiazem 120 MG 24 hr capsule Commonly known as: CARDIZEM CD Take 1 capsule (120 mg total) by mouth daily.   DULoxetine 60 MG capsule Commonly known as: CYMBALTA Take 60 mg by mouth at bedtime.   ezetimibe 10 MG tablet Commonly known as: ZETIA Take 1 tablet (10 mg total) by mouth daily. Start taking on: February 21, 2020   ferrous sulfate 325 (65 FE) MG tablet Commonly known as: FerrouSul Take 1 tablet (325 mg total) by mouth 3 (three) times daily with meals for 14 days.   gabapentin 100 MG capsule Commonly known as: NEURONTIN Take 1 capsule (100 mg total) by mouth at bedtime. What changed: how much to take   hydrALAZINE 25 MG tablet Commonly known as: APRESOLINE Take 25 mg by mouth 2 (two) times daily.   metoprolol tartrate 25 MG tablet Commonly known as: LOPRESSOR Take 0.5 tablets (12.5 mg total) by mouth 2 (two) times daily.   nitroGLYCERIN 0.4 MG SL tablet Commonly known as: NITROSTAT Place 1 tablet (0.4 mg total) under the tongue every 5 (five) minutes x 3 doses as needed for chest pain.   oxyCODONE 5 MG immediate release tablet Commonly known as: Oxy IR/ROXICODONE Take 1-2 tablets (5-10 mg total) by mouth every 4 (four) hours as needed for moderate pain or severe pain.   pantoprazole 40 MG tablet Commonly known as: PROTONIX Take 1 tablet (40 mg total) by mouth 2 (two) times daily. What changed:   when to take this  reasons to take this   polyethylene  glycol 17 g packet Commonly known as: MIRALAX / GLYCOLAX Take 17 g by mouth 2 (two) times daily.   tiZANidine 4 MG tablet Commonly known as: ZANAFLEX Take 1 tablet (4 mg total) by mouth 3 (three) times daily as needed for muscle spasms.   Vitamin D (Ergocalciferol) 1.25 MG (50000 UNIT) Caps capsule Commonly known as: DRISDOL Take 50,000 Units by mouth every Wednesday.      Follow-up Information    Maurice Small, MD. Schedule an appointment as soon as possible for a visit in 1 week(s).   Specialty: Family Medicine Contact information: [place] [address] [phone]        Gastroenterology, [place]. Schedule an appointment as soon as possible for a visit in 2 week(s).   Contact information: [address]  [place] [address] [phone]          Allergies  Allergen Reactions  . Midazolam Hcl Other (See Comments)    HYPER  . Prednisone  Other (See Comments)    HYPER, depressed, moody  . Statins Other (See Comments)    Causes muscle weakness and joint pain  . Ace Inhibitors Other (See Comments)    DECREASES HEARTRATE  . Amoxicillin Diarrhea    Did it involve swelling of the face/tongue/throat, SOB, or low BP? No Did it involve sudden or severe rash/hives, skin peeling, or any reaction on the inside of your mouth or nose? No Did you need to seek medical attention at a hospital or doctor's office? No When did it last happen?~2019 or so If all above answers are "NO", may proceed with cephalosporin use.   . Nsaids Other (See Comments)    AVOIDS; HX GASTRIC ULCER  . Sulfa Antibiotics Rash and Other (See Comments)    JOINT PAIN    Time coordinating discharge: Over 30 minutes   SIGNED:   Harold Hedge, D.O. [facility] Pager: [phone]  02/20/2020, 1:49 PM

## 2020-02-20 NOTE — Plan of Care (Signed)
  Problem: Activity: Goal: Risk for activity intolerance will decrease Outcome: Progressing   Problem: Coping: Goal: Level of anxiety will decrease Outcome: Progressing   Problem: Pain Managment: Goal: General experience of comfort will improve Outcome: Progressing   Problem: Safety: Goal: Ability to remain free from injury will improve Outcome: Progressing   Problem: Education: Goal: Knowledge of General Education information will improve Description: Including pain rating scale, medication(s)/side effects and non-pharmacologic comfort measures 02/20/2020 1520 by Margarette Canada, RN Outcome: Completed/Met 02/20/2020 1339 by Margarette Canada, RN Outcome: Progressing

## 2020-02-22 LAB — GI PATHOGEN PANEL BY PCR, STOOL

## 2020-02-23 DIAGNOSIS — M25561 Pain in right knee: Secondary | ICD-10-CM | POA: Diagnosis not present

## 2020-02-25 DIAGNOSIS — M25561 Pain in right knee: Secondary | ICD-10-CM | POA: Diagnosis not present

## 2020-02-26 DIAGNOSIS — A09 Infectious gastroenteritis and colitis, unspecified: Secondary | ICD-10-CM | POA: Diagnosis not present

## 2020-02-26 DIAGNOSIS — Z09 Encounter for follow-up examination after completed treatment for conditions other than malignant neoplasm: Secondary | ICD-10-CM | POA: Diagnosis not present

## 2020-02-27 DIAGNOSIS — M25561 Pain in right knee: Secondary | ICD-10-CM | POA: Diagnosis not present

## 2020-03-02 DIAGNOSIS — Z0000 Encounter for general adult medical examination without abnormal findings: Secondary | ICD-10-CM | POA: Diagnosis not present

## 2020-03-02 DIAGNOSIS — I10 Essential (primary) hypertension: Secondary | ICD-10-CM | POA: Diagnosis not present

## 2020-03-02 DIAGNOSIS — E782 Mixed hyperlipidemia: Secondary | ICD-10-CM | POA: Diagnosis not present

## 2020-03-02 DIAGNOSIS — E559 Vitamin D deficiency, unspecified: Secondary | ICD-10-CM | POA: Diagnosis not present

## 2020-03-02 DIAGNOSIS — R7303 Prediabetes: Secondary | ICD-10-CM | POA: Diagnosis not present

## 2020-03-02 DIAGNOSIS — M25561 Pain in right knee: Secondary | ICD-10-CM | POA: Diagnosis not present

## 2020-03-04 DIAGNOSIS — R6883 Chills (without fever): Secondary | ICD-10-CM | POA: Diagnosis not present

## 2020-03-04 DIAGNOSIS — K529 Noninfective gastroenteritis and colitis, unspecified: Secondary | ICD-10-CM | POA: Diagnosis not present

## 2020-03-09 DIAGNOSIS — M25561 Pain in right knee: Secondary | ICD-10-CM | POA: Diagnosis not present

## 2020-03-11 DIAGNOSIS — M25561 Pain in right knee: Secondary | ICD-10-CM | POA: Diagnosis not present

## 2020-03-16 DIAGNOSIS — M545 Low back pain: Secondary | ICD-10-CM | POA: Diagnosis not present

## 2020-03-16 DIAGNOSIS — Z96659 Presence of unspecified artificial knee joint: Secondary | ICD-10-CM | POA: Diagnosis not present

## 2020-03-16 DIAGNOSIS — M25561 Pain in right knee: Secondary | ICD-10-CM | POA: Diagnosis not present

## 2020-03-19 DIAGNOSIS — M25561 Pain in right knee: Secondary | ICD-10-CM | POA: Diagnosis not present

## 2020-03-23 ENCOUNTER — Emergency Department (HOSPITAL_COMMUNITY): Payer: Medicare Other

## 2020-03-23 ENCOUNTER — Emergency Department (HOSPITAL_COMMUNITY)
Admission: EM | Admit: 2020-03-23 | Discharge: 2020-03-23 | Disposition: A | Discharge status: Home / Self Care | Payer: Medicare Other | Attending: Emergency Medicine | Admitting: Emergency Medicine

## 2020-03-23 ENCOUNTER — Other Ambulatory Visit: Payer: Self-pay

## 2020-03-23 ENCOUNTER — Encounter (HOSPITAL_COMMUNITY): Payer: Self-pay | Admitting: Emergency Medicine

## 2020-03-23 DIAGNOSIS — Y939 Activity, unspecified: Secondary | ICD-10-CM | POA: Diagnosis not present

## 2020-03-23 DIAGNOSIS — W19XXXA Unspecified fall, initial encounter: Secondary | ICD-10-CM | POA: Insufficient documentation

## 2020-03-23 DIAGNOSIS — Z96651 Presence of right artificial knee joint: Secondary | ICD-10-CM | POA: Insufficient documentation

## 2020-03-23 DIAGNOSIS — S8001XA Contusion of right knee, initial encounter: Secondary | ICD-10-CM | POA: Insufficient documentation

## 2020-03-23 DIAGNOSIS — I10 Essential (primary) hypertension: Secondary | ICD-10-CM | POA: Diagnosis not present

## 2020-03-23 DIAGNOSIS — M25561 Pain in right knee: Secondary | ICD-10-CM | POA: Diagnosis not present

## 2020-03-23 DIAGNOSIS — Z79899 Other long term (current) drug therapy: Secondary | ICD-10-CM | POA: Insufficient documentation

## 2020-03-23 DIAGNOSIS — S81011A Laceration without foreign body, right knee, initial encounter: Secondary | ICD-10-CM

## 2020-03-23 DIAGNOSIS — F17210 Nicotine dependence, cigarettes, uncomplicated: Secondary | ICD-10-CM | POA: Diagnosis not present

## 2020-03-23 DIAGNOSIS — Y9289 Other specified places as the place of occurrence of the external cause: Secondary | ICD-10-CM | POA: Diagnosis not present

## 2020-03-23 DIAGNOSIS — Z23 Encounter for immunization: Secondary | ICD-10-CM | POA: Insufficient documentation

## 2020-03-23 DIAGNOSIS — Y999 Unspecified external cause status: Secondary | ICD-10-CM | POA: Insufficient documentation

## 2020-03-23 DIAGNOSIS — T1490XA Injury, unspecified, initial encounter: Secondary | ICD-10-CM

## 2020-03-23 MED ORDER — TETANUS-DIPHTH-ACELL PERTUSSIS 5-2.5-18.5 LF-MCG/0.5 IM SUSP
0.5000 mL | Freq: Once | INTRAMUSCULAR | Status: AC
  Administered 2020-03-23: 0.5 mL via INTRAMUSCULAR
  Filled 2020-03-23: qty 0.5

## 2020-03-23 MED ORDER — CEPHALEXIN 500 MG PO CAPS: 500.0000 mg | ORAL_CAPSULE | Freq: Two times a day (BID) | ORAL | 0 refills | Status: AC

## 2020-03-23 NOTE — ED Triage Notes (Signed)
Per pt, states recent right knee replacement-states she was at gas station and tripped over hose landing on right knee opening stitches-bleeding controlled at this time

## 2020-03-23 NOTE — ED Provider Notes (Signed)
[facility] DEPT Provider Note   CSN: [id number] Arrival date & time: 03/23/20  1806     History Chief Complaint  Patient presents with  . Knee Pain    [name] is a 64 y.o. female.  The history is provided by the patient.  Knee Pain Location:  Knee Injury: yes   Knee location:  R knee Pain details:    Quality:  Aching   Severity:  Mild   Onset quality:  Sudden   Timing:  Constant   Progression:  Unchanged Chronicity:  New Tetanus status:  Unknown Relieved by:  Nothing Worsened by:  Nothing Associated symptoms: no back pain, no decreased ROM, no fatigue, no fever, no itching, no muscle weakness, no neck pain, no numbness, no stiffness, no swelling and no tingling   Associated symptoms comment:  Laceration to right knee along old surgical site       Past Medical History:  Diagnosis Date  . Agoraphobia without history of panic disorder   . Anginal pain ([place])   . Anxiety   . Arthritis    "qwhere" (04/03/2018)  . Childhood asthma   . Chronic bronchitis ([place])   . Chronic lower back pain   . COPD (chronic obstructive pulmonary disease) ([place])   . Coronary artery disease CARDIOLOGIST- DR  Doylene Canard  (VISIT 03-30-11 W/ CHART)   NON-OBS. CAD   (STRESS TEST NOV. 2011  . Daily headache   . DDD (degenerative disc disease)   . Depression   . DJD (degenerative joint disease)    JOINT PAIN  . Dyspnea    "anytime but mostly on exertion and smoking"  . Elevated liver enzymes   . Fibromyalgia   . Frequency of urination   . GERD (gastroesophageal reflux disease) AND HIATIAL HERNIA   CONTROLLED W/ NEXIUM  . Heart murmur   . Hemorrhoids   . High cholesterol   . History of blood transfusion    "related to anemia" (04/03/2018)  . History of carpal tunnel syndrome    Right  . History of gastric ulcer 2004  . History of hiatal hernia   . Hypertension   . IBS (irritable bowel syndrome)   . Incisional pain s/p interstim implant 1st stage---  12-07-11    left upper buttock-- pt states dressing clean dry and intact (on 12-08-11)  . Insomnia   . Iron deficiency anemia   . Leukocytosis   . Nocturia   . Non-productive cough   . Numbness in both hands AT TIMES  . OSA (obstructive sleep apnea)    "suppose to wear mask; I don't" (04/03/2018)  . PONV (postoperative nausea and vomiting)   . SVT (supraventricular tachycardia) ([place])   . Urge urinary incontinence     Patient Active Problem List   Diagnosis Date Noted  . Colitis presumed infectious 02/17/2020  . Liver lesion 02/17/2020  . S/P right TKA 02/10/2020  . Low back pain 11/27/2018  . Acute coronary syndrome ([place]) 04/02/2018  . Hyperlipidemia 08/06/2017  . Influenza A 01/01/2017  . COPD exacerbation ([place]) 01/01/2017  . Acute renal failure (ARF) ([place]) 01/01/2017  . Dehydration 01/01/2017  . Unstable angina ([place]) 10/14/2016  . Chest pain 08/22/2016  . SVT (supraventricular tachycardia) ([place]) 08/22/2016  . OSA on CPAP   . Hypertension   . GERD (gastroesophageal reflux disease)   . Depression   . Coronary artery disease   . Anxiety   . Pain in the chest   . Hypokalemia   .  Carpal tunnel syndrome, bilateral 02/18/2016  . Leukocytosis 05/26/2014  . Tobacco abuse 05/26/2014  . Urge incontinence 12/14/2011    Past Surgical History:  Procedure Laterality Date  . ANTERIOR CERVICAL DECOMP/DISCECTOMY FUSION  2008   C4 - T1 ("screws from 1st OR came out")  . ANTERIOR CERVICAL DECOMP/DISCECTOMY FUSION  2002   C4 - 7  . ANTERIOR LATERAL LUMBAR FUSION WITH PERCUTANEOUS SCREW 2 LEVEL Left 11/27/2018   Procedure: XLIF Lumbar 3-5, posterior spinal fusion L3-5;  Surgeon: Melina Schools, MD;  Location: [facility];  Service: Orthopedics;  Laterality: Left;  5.5 hrs for entire procedure  . APPENDECTOMY  1982  . BACK SURGERY    . CARDIAC CATHETERIZATION  2003  . CARDIAC CATHETERIZATION N/A 08/23/2016   Procedure: Left Heart Cath and Coronary Angiography;  Surgeon: Dixie Dials, MD;   Location: [facility];  Service: Cardiovascular;  Laterality: N/A;  . CARPAL TUNNEL RELEASE Right   . CATARACT EXTRACTION W/ INTRAOCULAR LENS  IMPLANT, BILATERAL  2016-2018  . CHOLECYSTECTOMY OPEN  1982  . COLONOSCOPY    . CYSTO/ HOD/ BLADDER BX  2006  . CYSTO/ HOD/ BLADDER BX/ FULGERATION  06-12-2011  . FINGER SURGERY Right 2001   "replaced worn cartilage on thumb w/tendons"  . HYSTEROSCOPY WITH D & C  2005  . INTERSTIM IMPLANT PLACEMENT  12/07/2011   Procedure: Barrie Lyme IMPLANT FIRST STAGE;  Surgeon: [name], MD;  Location: [facility];  Service: Urology;  Laterality: Right;  . INTERSTIM IMPLANT PLACEMENT  12/14/2011   Procedure: Barrie Lyme IMPLANT SECOND STAGE;  Surgeon: [name], MD;  Location: [facility];  Service: Urology;  Laterality: Right;  rad tech ok by vickie at [facility]  . LUMBAR LAMINECTOMY  2007   L3 - 5  . TONSILLECTOMY AND ADENOIDECTOMY  1965  . TOTAL KNEE ARTHROPLASTY Right 02/10/2020   Procedure: TOTAL KNEE ARTHROPLASTY;  Surgeon: Paralee Cancel, MD;  Location: [facility];  Service: Orthopedics;  Laterality: Right;  70 mins  . TUBAL LIGATION    . UPPER GI ENDOSCOPY    . WRIST SURGERY Left    TFCC ("tendon repair")     OB History   No obstetric history on file.     Family History  Problem Relation Age of Onset  . Hypertension Father   . Heart disease Father   . Kidney failure Father   . Breast cancer Mother   . Hypertension Mother   . Coronary artery disease Mother   . Alzheimer's disease Mother   . Hypertension Brother   . Hypertension Sister   . Hypertension Daughter     Social History   Tobacco Use  . Smoking status: Current Every Day Smoker    Packs/day: 2.00    Years: 43.00    Pack years: 86.00    Types: Cigarettes  . Smokeless tobacco: Never Used  Substance Use Topics  . Alcohol use: Not Currently    Alcohol/week: 0.0 standard drinks    Comment: 04/03/2018 "nothing in years"  . Drug use:  Not Currently    Types: Marijuana    Home Medications Prior to Admission medications   Medication Sig Start Date End Date Taking? Authorizing Provider  acetaminophen (TYLENOL) 500 MG tablet Take 2 tablets (1,000 mg total) by mouth every 8 (eight) hours. 02/10/20   Danae Orleans, PA-C  albuterol (PROVENTIL HFA;VENTOLIN HFA) 108 (90 BASE) MCG/ACT inhaler Inhale 2 puffs into the lungs every 6 (six) hours as needed for wheezing  or shortness of breath.    [provider]  buPROPion (WELLBUTRIN XL) 300 MG 24 hr tablet Take 300 mg by mouth daily.    [provider]  busPIRone (BUSPAR) 10 MG tablet Take 10 mg by mouth 2 (two) times daily.    [provider]  cephALEXin (KEFLEX) 500 MG capsule Take 1 capsule (500 mg total) by mouth 2 (two) times daily for 7 days. 03/23/20 03/30/20  Sidni Fusco, DO  clonazePAM (KLONOPIN) 1 MG tablet Take 1 tablet (1 mg total) by mouth at bedtime. Patient taking differently: Take 1 mg by mouth 2 (two) times daily.  11/29/18   Melina Schools, MD  diltiazem (CARDIZEM CD) 120 MG 24 hr capsule Take 1 capsule (120 mg total) by mouth daily. 07/24/19   Camnitz, Will Hassell Done, MD  DULoxetine (CYMBALTA) 60 MG capsule Take 60 mg by mouth at bedtime.     [provider]  ezetimibe (ZETIA) 10 MG tablet Take 1 tablet (10 mg total) by mouth daily. 02/21/20   Harold Hedge, MD  ferrous sulfate (FERROUSUL) 325 (65 FE) MG tablet Take 1 tablet (325 mg total) by mouth 3 (three) times daily with meals for 14 days. 02/10/20 02/24/20  Danae Orleans, PA-C  gabapentin (NEURONTIN) 100 MG capsule Take 1 capsule (100 mg total) by mouth at bedtime. Patient taking differently: Take 200 mg by mouth at bedtime.  11/29/18   Melina Schools, MD  hydrALAZINE (APRESOLINE) 25 MG tablet Take 25 mg by mouth 2 (two) times daily.     [provider]  metoprolol tartrate (LOPRESSOR) 25 MG tablet Take 0.5 tablets (12.5 mg total) by mouth 2 (two) times daily. 07/04/19    Dixie Dials, MD  nitroGLYCERIN (NITROSTAT) 0.4 MG SL tablet Place 1 tablet (0.4 mg total) under the tongue every 5 (five) minutes x 3 doses as needed for chest pain. 08/24/16   Eugenie Filler, MD  oxyCODONE (OXY IR/ROXICODONE) 5 MG immediate release tablet Take 1-2 tablets (5-10 mg total) by mouth every 4 (four) hours as needed for moderate pain or severe pain. 02/10/20   Danae Orleans, PA-C  pantoprazole (PROTONIX) 40 MG tablet Take 1 tablet (40 mg total) by mouth 2 (two) times daily. 02/20/20   Harold Hedge, MD  polyethylene glycol (MIRALAX / GLYCOLAX) 17 g packet Take 17 g by mouth 2 (two) times daily. 02/10/20   Danae Orleans, PA-C  tiZANidine (ZANAFLEX) 4 MG tablet Take 1 tablet (4 mg total) by mouth 3 (three) times daily as needed for muscle spasms. 02/10/20   Danae Orleans, PA-C  Vitamin D, Ergocalciferol, (DRISDOL) 1.25 MG (50000 UNIT) CAPS capsule Take 50,000 Units by mouth every Wednesday. 12/03/19   [provider]    Allergies    Midazolam hcl, Prednisone, Statins, Ace inhibitors, Amoxicillin, Nsaids, and Sulfa antibiotics  Review of Systems   Review of Systems  Constitutional: Negative for chills, fatigue and fever.  HENT: Negative for ear pain and sore throat.   Eyes: Negative for pain and visual disturbance.  Respiratory: Negative for cough and shortness of breath.   Cardiovascular: Negative for chest pain and palpitations.  Gastrointestinal: Negative for abdominal pain and vomiting.  Genitourinary: Negative for dysuria and hematuria.  Musculoskeletal: Positive for arthralgias. Negative for back pain, neck pain and stiffness.  Skin: Positive for wound. Negative for color change, itching and rash.  Neurological: Negative for seizures and syncope.  All other systems reviewed and are negative.   Physical Exam Updated Vital Signs BP Marland Kitchen)  155/67 (BP Location: Left Arm)   Pulse 63   Temp 98.8 F (37.1 C) (Oral)   Resp 20   Wt 69.9 kg   LMP 09/06/2005   BMI  31.10 kg/m   Physical Exam Vitals and nursing note reviewed.  Constitutional:      General: She is not in acute distress.    Appearance: She is well-developed. She is not ill-appearing.  HENT:     Head: Normocephalic and atraumatic.  Eyes:     Conjunctiva/sclera: Conjunctivae normal.  Cardiovascular:     Rate and Rhythm: Normal rate and regular rhythm.     Heart sounds: No murmur.  Pulmonary:     Effort: Pulmonary effort is normal. No respiratory distress.     Breath sounds: Normal breath sounds.  Abdominal:     Palpations: Abdomen is soft.     Tenderness: There is no abdominal tenderness.  Musculoskeletal:        General: Tenderness (right knee) present. Normal range of motion.     Cervical back: Neck supple.  Skin:    General: Skin is warm and dry.     Comments: 7 cm hemostatic linear laceration to right knee along surigcal scar  Neurological:     General: No focal deficit present.     Mental Status: She is alert.     Sensory: No sensory deficit.     Motor: No weakness.  Psychiatric:        Mood and Affect: Mood normal.     ED Results / Procedures / Treatments   Labs (all labs ordered are listed, but only abnormal results are displayed) Labs Reviewed - No data to display  EKG None  Radiology DG Knee Complete 4 Views Right  Result Date: 03/23/2020 CLINICAL DATA:  Right knee pain after fall. Recent knee arthroplasty. Tripped over hose at gas station landing on right knee. EXAM: RIGHT KNEE - COMPLETE 4+ VIEW COMPARISON:  None. FINDINGS: Right knee arthroplasty in expected alignment. No periprosthetic lucency or fracture. There has been patellar resurfacing. Small knee joint effusion. Generalized soft tissue edema anteriorly. IMPRESSION: 1. Intact right knee arthroplasty without periprosthetic fracture. 2. Small joint effusion with generalized soft tissue edema. Electronically Signed   By: Keith Rake M.D.   On: 03/23/2020 19:20    Procedures .Marland KitchenLaceration  Repair  Date/Time: 03/23/2020 8:29 PM Performed by: Lennice Sites, DO Authorized by: Lennice Sites, DO   Consent:    Consent obtained:  Verbal   Consent given by:  Patient   Risks discussed:  Infection, need for additional repair, nerve damage, pain, poor cosmetic result, poor wound healing, retained foreign body, tendon damage and vascular damage   Alternatives discussed:  No treatment and delayed treatment Anesthesia (see MAR for exact dosages):    Anesthesia method:  None Laceration details:    Location:  Leg   Leg location:  R knee   Length (cm):  7   Depth (mm):  1 Repair type:    Repair type:  Simple Pre-procedure details:    Preparation:  Patient was prepped and draped in usual sterile fashion Exploration:    Wound exploration: wound explored through full range of motion and entire depth of wound probed and visualized     Wound extent: no areolar tissue violation noted, no fascia violation noted, no foreign bodies/material noted, no muscle damage noted, no nerve damage noted, no tendon damage noted, no underlying fracture noted and no vascular damage noted     Contaminated: no  Treatment:    Area cleansed with:  Saline   Amount of cleaning:  Standard   Irrigation solution:  Sterile saline   Irrigation volume:  1000   Irrigation method:  Tap   Visualized foreign bodies/material removed: no   Skin repair:    Repair method:  Staples   Number of staples:  9 Approximation:    Approximation:  Close Post-procedure details:    Dressing:  Open (no dressing)   Patient tolerance of procedure:  Tolerated well, no immediate complications   (including critical care time)  Medications Ordered in ED Medications  Tdap (BOOSTRIX) injection 0.5 mL (has no administration in time range)    ED Course  I have reviewed the triage vital signs and the nursing notes.  Pertinent labs & imaging results that were available during my care of the patient were reviewed by me and considered  in my medical decision making (see chart for details).    MDM Rules/Calculators/A&P                      KASHANA MOLENAAR is a 64 year old female who presents to the ED with right knee pain, laceration. Patient with unremarkable vitals. No fever. Patient with recent right knee replacement several months ago. She had mechanical fall, landed on her right knee. Has lacerations to the right knee along her surgical scar. X-ray showed no fracture. No major effusion. Wound is overall clean, dry, intact. Wound was irrigated, tetanus was updated. After discussion patient preferred staples for wound. Patient was given 9 staples. Will prophylactically start Keflex. She actually has follow-up with orthopedics later this week. Given return precautions and discharged in ED in good condition. Patient given knee immobilizer using crutches and recommend ~1 to 2 days for staples setting.  This chart was dictated using voice recognition software.  Despite best efforts to proofread,  errors can occur which can change the documentation meaning.    Final Clinical Impression(s) / ED Diagnoses Final diagnoses:  Injury  Contusion of right knee, initial encounter  Laceration of right knee, initial encounter    Rx / DC Orders ED Discharge Orders         Ordered    cephALEXin (KEFLEX) 500 MG capsule  2 times daily     03/23/20 2032           Lennice Sites, DO 03/23/20 2032

## 2020-03-25 DIAGNOSIS — M25561 Pain in right knee: Secondary | ICD-10-CM | POA: Diagnosis not present

## 2020-03-29 DIAGNOSIS — M25561 Pain in right knee: Secondary | ICD-10-CM | POA: Diagnosis not present

## 2020-03-31 DIAGNOSIS — M25561 Pain in right knee: Secondary | ICD-10-CM | POA: Diagnosis not present

## 2020-04-06 DIAGNOSIS — M25561 Pain in right knee: Secondary | ICD-10-CM | POA: Diagnosis not present

## 2020-04-07 DIAGNOSIS — R197 Diarrhea, unspecified: Secondary | ICD-10-CM | POA: Diagnosis not present

## 2020-04-09 ENCOUNTER — Other Ambulatory Visit: Payer: Self-pay | Admitting: Cardiology

## 2020-04-09 DIAGNOSIS — M25561 Pain in right knee: Secondary | ICD-10-CM | POA: Diagnosis not present

## 2020-04-13 DIAGNOSIS — M25561 Pain in right knee: Secondary | ICD-10-CM | POA: Diagnosis not present

## 2020-04-16 DIAGNOSIS — M25561 Pain in right knee: Secondary | ICD-10-CM | POA: Diagnosis not present

## 2020-04-19 DIAGNOSIS — M25561 Pain in right knee: Secondary | ICD-10-CM | POA: Diagnosis not present

## 2020-04-22 DIAGNOSIS — M25561 Pain in right knee: Secondary | ICD-10-CM | POA: Diagnosis not present

## 2020-04-27 DIAGNOSIS — M25561 Pain in right knee: Secondary | ICD-10-CM | POA: Diagnosis not present

## 2020-05-05 DIAGNOSIS — I10 Essential (primary) hypertension: Secondary | ICD-10-CM | POA: Diagnosis not present

## 2020-05-05 DIAGNOSIS — M545 Low back pain: Secondary | ICD-10-CM | POA: Diagnosis not present

## 2020-05-05 DIAGNOSIS — J410 Simple chronic bronchitis: Secondary | ICD-10-CM | POA: Diagnosis not present

## 2020-05-05 DIAGNOSIS — M25562 Pain in left knee: Secondary | ICD-10-CM | POA: Diagnosis not present

## 2020-05-19 DIAGNOSIS — Z23 Encounter for immunization: Secondary | ICD-10-CM | POA: Diagnosis not present

## 2020-05-23 NOTE — Progress Notes (Signed)
Cardiology Office Note Date:  05/23/2020  Patient ID:  [name], [name] [birth year], MRN [id number] PCP:  Maurice Small, MD  Cardiologist:  [name] EP: Dr. Curt Bears    Chief Complaint:    over due annual visit    History of Present Illness: Brenda MIDDLESWORTH is a 64 y.o. female with history of HTN, fibromyalgia, OSA w/CPAP, GERD, HLD, Nonobstructive CAD by cath 2017, obesity, COPD/chronic bronchitis, SVT (mid-RP, adenosine sensitive).  He come sin today to be seen for Dr. Curt Bears, last seen by him via tele health May 2020.  Notes h/oSVT did break with adenosine.  She had short-lived atrial fibrillation post adenosine. BP was elevated and started on HCTZ with further f/u with [name].  Mentions f/u 9monitor without noted Afib, not on a/c.  Recommended annual f/u  He was hospitalized 3/1, discharged 02/20/2020 with acute colitis, noted w/chronic leukocytosis, chronic epigastric pain.  Discussed poor lifesyle, eating habits, obesity likely contributes.   She has chronic b/l knee trouble s/p R knee surgery in feb with a re-injury shortly afterwards and is pending eventually L knee surgery as well.  She has chronic back pain with sciatic issues and these are her exertional limitations and is largely more sedentary this past year because of them.  She has had trouble sleeping, awake all night and sleeping all day now, she is working on this with he PMD. No CP, or SOB, she will occassionally find her HR 100-102 with minimal exertion, on her pulse ox, has not had symptoms of her SVT, no cardiac awareness or palpitations. She just saw [name] last week, discussed this with him No near syncope or syncope.   Past Medical History:  Diagnosis Date  . Agoraphobia without history of panic disorder   . Anginal pain ([place])   . Anxiety   . Arthritis    "qwhere" (04/03/2018)  . Childhood asthma   . Chronic bronchitis ([place])   . Chronic lower back pain   . COPD (chronic obstructive pulmonary  disease) ([place])   . Coronary artery disease CARDIOLOGIST- DR  Doylene Canard  (VISIT 03-30-11 W/ CHART)   NON-OBS. CAD   (STRESS TEST NOV. 2011  . Daily headache   . DDD (degenerative disc disease)   . Depression   . DJD (degenerative joint disease)    JOINT PAIN  . Dyspnea    "anytime but mostly on exertion and smoking"  . Elevated liver enzymes   . Fibromyalgia   . Frequency of urination   . GERD (gastroesophageal reflux disease) AND HIATIAL HERNIA   CONTROLLED W/ NEXIUM  . Heart murmur   . Hemorrhoids   . High cholesterol   . History of blood transfusion    "related to anemia" (04/03/2018)  . History of carpal tunnel syndrome    Right  . History of gastric ulcer 2004  . History of hiatal hernia   . Hypertension   . IBS (irritable bowel syndrome)   . Incisional pain s/p interstim implant 1st stage--- 12-07-11    left upper buttock-- pt states dressing clean dry and intact (on 12-08-11)  . Insomnia   . Iron deficiency anemia   . Leukocytosis   . Nocturia   . Non-productive cough   . Numbness in both hands AT TIMES  . OSA (obstructive sleep apnea)    "suppose to wear mask; I don't" (04/03/2018)  . PONV (postoperative nausea and vomiting)   . SVT (supraventricular tachycardia) ([place])   . Urge urinary incontinence  Past Surgical History:  Procedure Laterality Date  . ANTERIOR CERVICAL DECOMP/DISCECTOMY FUSION  2008   C4 - T1 ("screws from 1st OR came out")  . ANTERIOR CERVICAL DECOMP/DISCECTOMY FUSION  2002   C4 - 7  . ANTERIOR LATERAL LUMBAR FUSION WITH PERCUTANEOUS SCREW 2 LEVEL Left 11/27/2018   Procedure: XLIF Lumbar 3-5, posterior spinal fusion L3-5;  Surgeon: Melina Schools, MD;  Location: [facility];  Service: Orthopedics;  Laterality: Left;  5.5 hrs for entire procedure  . APPENDECTOMY  1982  . BACK SURGERY    . CARDIAC CATHETERIZATION  2003  . CARDIAC CATHETERIZATION N/A 08/23/2016   Procedure: Left Heart Cath and Coronary Angiography;  Surgeon: Dixie Dials, MD;   Location: [facility];  Service: Cardiovascular;  Laterality: N/A;  . CARPAL TUNNEL RELEASE Right   . CATARACT EXTRACTION W/ INTRAOCULAR LENS  IMPLANT, BILATERAL  2016-2018  . CHOLECYSTECTOMY OPEN  1982  . COLONOSCOPY    . CYSTO/ HOD/ BLADDER BX  2006  . CYSTO/ HOD/ BLADDER BX/ FULGERATION  06-12-2011  . FINGER SURGERY Right 2001   "replaced worn cartilage on thumb w/tendons"  . HYSTEROSCOPY WITH D & C  2005  . INTERSTIM IMPLANT PLACEMENT  12/07/2011   Procedure: Barrie Lyme IMPLANT FIRST STAGE;  Surgeon: [name], MD;  Location: [facility];  Service: Urology;  Laterality: Right;  . INTERSTIM IMPLANT PLACEMENT  12/14/2011   Procedure: Barrie Lyme IMPLANT SECOND STAGE;  Surgeon: [name], MD;  Location: [facility];  Service: Urology;  Laterality: Right;  rad tech ok by vickie at [facility]  . LUMBAR LAMINECTOMY  2007   L3 - 5  . TONSILLECTOMY AND ADENOIDECTOMY  1965  . TOTAL KNEE ARTHROPLASTY Right 02/10/2020   Procedure: TOTAL KNEE ARTHROPLASTY;  Surgeon: Paralee Cancel, MD;  Location: [facility];  Service: Orthopedics;  Laterality: Right;  70 mins  . TUBAL LIGATION    . UPPER GI ENDOSCOPY    . WRIST SURGERY Left    TFCC ("tendon repair")    Current Outpatient Medications  Medication Sig Dispense Refill  . acetaminophen (TYLENOL) 500 MG tablet Take 2 tablets (1,000 mg total) by mouth every 8 (eight) hours. 30 tablet 0  . albuterol (PROVENTIL HFA;VENTOLIN HFA) 108 (90 BASE) MCG/ACT inhaler Inhale 2 puffs into the lungs every 6 (six) hours as needed for wheezing or shortness of breath.    Marland Kitchen buPROPion (WELLBUTRIN XL) 300 MG 24 hr tablet Take 300 mg by mouth daily.    . busPIRone (BUSPAR) 10 MG tablet Take 10 mg by mouth 2 (two) times daily.    . clonazePAM (KLONOPIN) 1 MG tablet Take 1 tablet (1 mg total) by mouth at bedtime. (Patient taking differently: Take 1 mg by mouth 2 (two) times daily. ) 30 tablet 5  . diltiazem (CARDIZEM CD) 120 MG 24  hr capsule Take 1 capsule (120 mg total) by mouth daily. Please schedule appt for future refills. 1st attempt 90 capsule 0  . DULoxetine (CYMBALTA) 60 MG capsule Take 60 mg by mouth at bedtime.     Marland Kitchen ezetimibe (ZETIA) 10 MG tablet Take 1 tablet (10 mg total) by mouth daily. 30 tablet 0  . ferrous sulfate (FERROUSUL) 325 (65 FE) MG tablet Take 1 tablet (325 mg total) by mouth 3 (three) times daily with meals for 14 days. 42 tablet 0  . gabapentin (NEURONTIN) 100 MG capsule Take 1 capsule (100 mg total) by mouth at bedtime. (Patient taking differently: Take 200  mg by mouth at bedtime. ) 30 capsule 0  . hydrALAZINE (APRESOLINE) 25 MG tablet Take 25 mg by mouth 2 (two) times daily.     . metoprolol tartrate (LOPRESSOR) 25 MG tablet Take 0.5 tablets (12.5 mg total) by mouth 2 (two) times daily. 30 tablet 3  . nitroGLYCERIN (NITROSTAT) 0.4 MG SL tablet Place 1 tablet (0.4 mg total) under the tongue every 5 (five) minutes x 3 doses as needed for chest pain. 10 tablet 0  . oxyCODONE (OXY IR/ROXICODONE) 5 MG immediate release tablet Take 1-2 tablets (5-10 mg total) by mouth every 4 (four) hours as needed for moderate pain or severe pain. 60 tablet 0  . pantoprazole (PROTONIX) 40 MG tablet Take 1 tablet (40 mg total) by mouth 2 (two) times daily. 60 tablet 0  . polyethylene glycol (MIRALAX / GLYCOLAX) 17 g packet Take 17 g by mouth 2 (two) times daily. 28 packet 0  . tiZANidine (ZANAFLEX) 4 MG tablet Take 1 tablet (4 mg total) by mouth 3 (three) times daily as needed for muscle spasms. 30 tablet 0  . Vitamin D, Ergocalciferol, (DRISDOL) 1.25 MG (50000 UNIT) CAPS capsule Take 50,000 Units by mouth every Wednesday.     No current facility-administered medications for this visit.    Allergies:   Midazolam hcl, Prednisone, Statins, Ace inhibitors, Amoxicillin, Nsaids, and Sulfa antibiotics   Social History:  The patient  reports that she has been smoking cigarettes. She has a 86.00 pack-year smoking history.  She has never used smokeless tobacco. She reports previous alcohol use. She reports previous drug use. Drug: Marijuana.   Family History:  The patient's family history includes Alzheimer's disease in her mother; Breast cancer in her mother; Coronary artery disease in her mother; Heart disease in her father; Hypertension in her brother, daughter, father, mother, and sister; Kidney failure in her father.  ROS:  Please see the history of present illness.  All other systems are reviewed and otherwise negative.   PHYSICAL EXAM:  VS:  LMP 09/06/2005  BMI: There is no height or weight on file to calculate BMI. Well nourished, well developed, in no acute distress  HEENT: normocephalic, atraumatic  Neck: no JVD, carotid bruits or masses Cardiac:  RRR; no significant murmurs, no rubs, or gallops Lungs:  CTA b/l, no wheezing, rhonchi or rales  Abd: soft, nontender MS: no deformity or  atrophy Ext: no edema  Skin: warm and dry, no rash Neuro:  No gross deficits appreciated Psych: euthymic mood, full affect    EKG:  personally reviewed 02/18/2020 SR 68bpm, , QS V103, appears unchanged from prior EKGs   07/04/2019: lexiscan Stress myoview IMPRESSION: 1. No reversible ischemia. Stable anteroapical and apicoseptal defects. 2. Marked septal hypokinesis. 3. Left ventricular ejection fraction 43  % 4. Non invasive risk stratification*: Intermediate   08/24/2016: TTE Study Conclusions  - Left ventricle: The cavity size was normal. There was mild  concentric hypertrophy. Systolic function was normal. The  estimated ejection fraction was in the range of 60% to 65%. Wall  motion was normal; there were no regional wall motion  abnormalities. Doppler parameters are consistent with abnormal  left ventricular relaxation (grade 1 diastolic dysfunction).  - Mitral valve: Calcified annulus.    08/23/2016; LHC  Prox RCA lesion, 45 %stenosed.  Ost LAD to Prox LAD lesion, 25 %stenosed.  Mid LAD  to Dist LAD lesion, 40 %stenosed.  Dist LAD lesion, 60 %stenosed.  Ost 1st Mrg lesion, 20 %stenosed.  The left  ventricular systolic function is normal.  LV end diastolic pressure is normal.  Ost 2nd Mrg lesion, 90 %stenosed.   Life style modification. Medical treatment as OM 2 is relatively small size vessel  Recent Labs: 08/02/2019: B Natriuretic Peptide 39.3 02/18/2020: ALT 23 02/20/2020: BUN 11; Creatinine, Ser 0.83; Hemoglobin 11.5; Platelets 324; Potassium 3.4; Sodium 134  02/19/2020: Cholesterol 178; HDL 33; LDL Cholesterol 103; Total CHOL/HDL Ratio 5.4; Triglycerides 211; VLDL 42   CrCl cannot be calculated (Patient's most recent lab result is older than the maximum 21 days allowed.).   Wt Readings from Last 3 Encounters:  03/23/20 154 lb (69.9 kg)  02/17/20 167 lb 15.9 oz (76.2 kg)  02/10/20 164 lb (74.4 kg)     Other studies reviewed: Additional studies/records reviewed today include: summarized above  ASSESSMENT AND PLAN:  1. SVT     Mid-RP, adenosine sensitive     No symptoms to suggest recurrence     Discussed vagal maneuvers today     continue low dose metoprolol  She will continue cardiology f/u and management with [name], saw him last week.  Will defer any monitoring to him Encouraged to start walking for exercise to her orthopedic tolerance to help with cardiac conditioning as well as perhaps her sleep  We can see her PRN  2. HTN     Looks OK  3. Non-obstructive CAD     No ischemia stress test last year     Follows with [name]  4. Mild CM    LVEF by nuclear was 43% in 2020    Last echo available 2017 LVEF was 60-65%     No symptoms or exam findings of volume OL     C/w her cardiologist, [name], his note mentions historically unable to tolerate ACE     Disposition: F/u with EP in a year or PRN    Current medicines are reviewed at length with the patient today.  The patient did not have any concerns regarding  medicines.  Venetia Night, PA-C 05/23/2020 7:19 PM     [address] [place] [address] [phone] (office)  [phone] (fax)

## 2020-05-26 ENCOUNTER — Other Ambulatory Visit: Payer: Self-pay

## 2020-05-26 ENCOUNTER — Ambulatory Visit (INDEPENDENT_AMBULATORY_CARE_PROVIDER_SITE_OTHER): Payer: Medicare Other | Admitting: Physician Assistant

## 2020-05-26 VITALS — BP 130/66 | HR 71 | Ht 59.0 in | Wt 159.0 lb

## 2020-05-26 DIAGNOSIS — I471 Supraventricular tachycardia: Secondary | ICD-10-CM

## 2020-05-26 DIAGNOSIS — I10 Essential (primary) hypertension: Secondary | ICD-10-CM

## 2020-05-26 NOTE — Patient Instructions (Addendum)
Medication Instructions:  ° ° °Your physician recommends that you continue on your current medications as directed. Please refer to the Current Medication list given to you today. ° ° °*If you need a refill on your cardiac medications before your next appointment, please call your pharmacy* ° ° °Lab Work: NONE ORDERED  TODAY ° ° °If you have labs (blood work) drawn today and your tests are completely normal, you will receive your results only by: °MyChart Message (if you have MyChart) OR °A paper copy in the mail °If you have any lab test that is abnormal or we need to change your treatment, we will call you to review the results. ° ° °Testing/Procedures: NONE ORDERED  TODAY ° ° °Follow-Up: °At [facility], you and your health needs are our priority.  As part of our continuing mission to provide you with exceptional heart care, we have created designated Provider Care Teams.  These Care Teams include your primary Cardiologist (physician) and Advanced Practice Providers (APPs -  Physician Assistants and Nurse Practitioners) who all work together to provide you with the care you need, when you need it. ° °We recommend signing up for the patient portal called "MyChart".  Sign up information is provided on this After Visit Summary.  MyChart is used to connect with patients for Virtual Visits (Telemedicine).  Patients are able to view lab/test results, encounter notes, upcoming appointments, etc.  Non-urgent messages can be sent to your provider as well.   °To learn more about what you can do with MyChart, go to [email or web address].   ° °Your next appointment:   ° °CONTACT [facility] [phone] AS NEEDED FOR  ANY CARDIAC RELATED SYMPTOMS °}  ° ° °Other Instructions ° °

## 2020-07-01 DIAGNOSIS — Z1231 Encounter for screening mammogram for malignant neoplasm of breast: Secondary | ICD-10-CM | POA: Diagnosis not present

## 2020-07-05 ENCOUNTER — Other Ambulatory Visit: Payer: Self-pay | Admitting: Cardiology

## 2020-07-09 NOTE — Patient Instructions (Addendum)
DUE TO COVID-19 ONLY ONE VISITOR IS ALLOWED TO COME WITH YOU AND STAY IN THE WAITING ROOM ONLY DURING PRE OP AND PROCEDURE DAY OF SURGERY. THE 2 VISITORS  MAY VISIT WITH YOU AFTER SURGERY IN YOUR PRIVATE ROOM DURING VISITING HOURS ONLY!  YOU NEED TO HAVE A COVID 19 TEST ON_7/30______ @__9 :30____, THIS TEST MUST BE DONE BEFORE SURGERY, COME  [place] , [id number].  ([place]) ONCE YOUR COVID TEST IS COMPLETED, PLEASE BEGIN THE QUARANTINE INSTRUCTIONS AS OUTLINED IN YOUR HANDOUT.                Wilhel[place]ia Blase   Your procedure is scheduled on: 07/20/20   Report to [facility] Main  Entrance   Report to Short Stay at 5:30 AM     Call this number if you have problems the morning of surgery [place], NO CHEWING GUM [place].   Do not eat food After Midnight.  YOU MAY HAVE CLEAR LIQUIDS FROM MIDNIGHT UNTIL 4:30AM .  At 4:30AM Please finish the prescribed Pre-Surgery  drink  . Nothing by mouth after you finish the  drink !   Take these medicines the morning of surgery with A SIP OF WATER: Metoprolol, Diltiazem, Protonix, use your inhalers and bring them with you to the hospital  Bring your mask and tubing to the hospital                               You may not have any metal on your body including hair pins and              piercings  Do not wear jewelry, make-up, lotions, powders or perfumes, deodorant             Do not wear nail polish on your fingernails.  Do not shave  48 hours prior to surgery.              Do not bring valuables to the hospital. [place].  Contacts, dentures or bridgework may not be worn into surgery.                Please read over the following fact sheets you were given: _____________________________________________________________________             [facility] - Preparing for  Surgery Before surgery, you can play an important role.   Because skin is not sterile, your skin needs to be as free of germs as possible.   You can reduce the number of germs on your skin by washing with CHG (chlorahexidine gluconate) soap before surgery.   CHG is an antiseptic cleaner which kills germs and bonds with the skin to continue killing germs even after washing. Please DO NOT use if you have an allergy to CHG or antibacterial soaps .  If your skin becomes reddened/irritated stop using the CHG and inform your nurse when you arrive at Short Stay. Do not shave (including legs and underarms) for at least 48 hours prior to the first CHG shower.. Please follow these instructions carefully:  1.  Shower with CHG Soap the night before surgery and the  morning of Surgery.  2.  If you choose to wash your hair,  wash your hair first as usual with your  normal  shampoo.  3.  After you shampoo, rinse your hair and body thoroughly to remove the  shampoo.                                        4.  Use CHG as you would any other liquid soap.  You can apply chg directly  to the skin and wash                       Gently with a scrungie or clean washcloth.  5.  Apply the CHG Soap to your body ONLY FROM THE NECK DOWN.   Do not use on face/ open                           Wound or open sores. Avoid contact with eyes, ears mouth and genitals (private parts).                       Wash face,  Genitals (private parts) with your normal soap.             6.  Wash thoroughly, paying special attention to the area where your surgery  will be performed.  7.  Thoroughly rinse your body with warm water from the neck down.  8.  DO NOT shower/wash with your normal soap after using and rinsing off  the CHG Soap.             9.  Pat yourself dry with a clean towel.            10.  Wear clean pajamas.            11.  Place clean sheets on your bed the night of your first shower and do not  sleep with pets. Day of Surgery  : Do not apply any lotions/deodorants the morning of surgery.  Please wear clean clothes to the hospital/surgery center.  FAILURE TO FOLLOW THESE INSTRUCTIONS MAY RESULT IN THE CANCELLATION OF YOUR SURGERY PATIENT SIGNATURE_________________________________  NURSE SIGNATURE__________________________________  ________________________________________________________________________   [name]  An incentive spirometer is a tool that can help keep your lungs clear and active. This tool measures how well you are filling your lungs with each breath. Taking long deep breaths may help reverse or decrease the chance of developing breathing (pulmonary) problems (especially infection) following:  A long period of time when you are unable to move or be active. BEFORE THE PROCEDURE   If the spirometer includes an indicator to show your best effort, your nurse or respiratory therapist will set it to a desired goal.  If possible, sit up straight or lean slightly forward. Try not to slouch.  Hold the incentive spirometer in an upright position. INSTRUCTIONS FOR USE  1. Sit on the edge of your bed if possible, or sit up as far as you can in bed or on a chair. 2. Hold the incentive spirometer in an upright position. 3. Breathe out normally. 4. Place the mouthpiece in your mouth and seal your lips tightly around it. 5. Breathe in slowly and as deeply as possible, raising the piston or the ball toward the top of the column. 6. Hold your breath for 3-5 seconds or for as long as possible. Allow the piston  or ball to fall to the bottom of the column. 7. Remove the mouthpiece from your mouth and breathe out normally. 8. Rest for a few seconds and repeat Steps 1 through 7 at least 10 times every 1-2 hours when you are awake. Take your time and take a few normal breaths between deep breaths. 9. The spirometer may include an indicator to show your best effort. Use the indicator as a goal to work  toward during each repetition. 10. After each set of 10 deep breaths, practice coughing to be sure your lungs are clear. If you have an incision (the cut made at the time of surgery), support your incision when coughing by placing a pillow or rolled up towels firmly against it. Once you are able to get out of bed, walk around indoors and cough well. You may stop using the incentive spirometer when instructed by your caregiver.  RISKS AND COMPLICATIONS  Take your time so you do not get dizzy or light-headed.  If you are in pain, you may need to take or ask for pain medication before doing incentive spirometry. It is harder to take a deep breath if you are having pain. AFTER USE  Rest and breathe slowly and easily.  It can be helpful to keep track of a log of your progress. Your caregiver can provide you with a simple table to help with this. If you are using the spirometer at home, follow these instructions: [place] IF:   You are having difficultly using the spirometer.  You have trouble using the spirometer as often as instructed.  Your pain medication is not giving enough relief while using the spirometer.  You develop fever of 100.5 F (38.1 C) or higher. SEEK IMMEDIATE MEDICAL CARE IF:   You cough up bloody sputum that had not been present before.  You develop fever of 102 F (38.9 C) or greater.  You develop worsening pain at or near the incision site. MAKE SURE YOU:   Understand these instructions.  Will watch your condition.  Will get help right away if you are not doing well or get worse. Document Released: 04/16/2007 Document Revised: 02/26/2012 Document Reviewed: 06/17/2007 [facility] Patient Information 2014 [place], [place].   ________________________________________________________________________

## 2020-07-12 ENCOUNTER — Encounter (HOSPITAL_COMMUNITY): Payer: Self-pay

## 2020-07-12 ENCOUNTER — Encounter (HOSPITAL_COMMUNITY)
Admission: RE | Admit: 2020-07-12 | Discharge: 2020-07-12 | Disposition: A | Discharge status: Home / Self Care | Payer: Medicare Other | Source: Ambulatory Visit | Attending: Orthopedic Surgery | Admitting: Orthopedic Surgery

## 2020-07-12 ENCOUNTER — Other Ambulatory Visit: Payer: Self-pay

## 2020-07-12 DIAGNOSIS — M1712 Unilateral primary osteoarthritis, left knee: Secondary | ICD-10-CM | POA: Diagnosis not present

## 2020-07-12 DIAGNOSIS — F17210 Nicotine dependence, cigarettes, uncomplicated: Secondary | ICD-10-CM | POA: Diagnosis not present

## 2020-07-12 DIAGNOSIS — Z79899 Other long term (current) drug therapy: Secondary | ICD-10-CM | POA: Insufficient documentation

## 2020-07-12 DIAGNOSIS — I2510 Atherosclerotic heart disease of native coronary artery without angina pectoris: Secondary | ICD-10-CM | POA: Insufficient documentation

## 2020-07-12 DIAGNOSIS — K219 Gastro-esophageal reflux disease without esophagitis: Secondary | ICD-10-CM | POA: Insufficient documentation

## 2020-07-12 DIAGNOSIS — G4733 Obstructive sleep apnea (adult) (pediatric): Secondary | ICD-10-CM | POA: Diagnosis not present

## 2020-07-12 DIAGNOSIS — I10 Essential (primary) hypertension: Secondary | ICD-10-CM | POA: Diagnosis not present

## 2020-07-12 DIAGNOSIS — Z01812 Encounter for preprocedural laboratory examination: Secondary | ICD-10-CM | POA: Diagnosis not present

## 2020-07-12 DIAGNOSIS — Z7982 Long term (current) use of aspirin: Secondary | ICD-10-CM | POA: Insufficient documentation

## 2020-07-12 LAB — CBC
HCT: 47.2 % — ABNORMAL HIGH (ref 36.0–46.0)
Hemoglobin: 14.7 g/dL (ref 12.0–15.0)
MCH: 29.2 pg (ref 26.0–34.0)
MCHC: 31.1 g/dL (ref 30.0–36.0)
MCV: 93.7 fL (ref 80.0–100.0)
Platelets: 436 10*3/uL — ABNORMAL HIGH (ref 150–400)
RBC: 5.04 MIL/uL (ref 3.87–5.11)
RDW: 14.5 % (ref 11.5–15.5)
WBC: 29.2 10*3/uL — ABNORMAL HIGH (ref 4.0–10.5)
nRBC: 0 % (ref 0.0–0.2)

## 2020-07-12 LAB — BASIC METABOLIC PANEL
Anion gap: 11 (ref 5–15)
BUN: 30 mg/dL — ABNORMAL HIGH (ref 8–23)
CO2: 27 mmol/L (ref 22–32)
Calcium: 9.9 mg/dL (ref 8.9–10.3)
Chloride: 100 mmol/L (ref 98–111)
Creatinine, Ser: 0.89 mg/dL (ref 0.44–1.00)
GFR calc Af Amer: >60 mL/min (ref >60)
GFR calc non Af Amer: >60 mL/min (ref >60)
Glucose, Bld: 123 mg/dL — ABNORMAL HIGH (ref 70–99)
Potassium: 4.1 mmol/L (ref 3.5–5.1)
Sodium: 138 mmol/L (ref 135–145)

## 2020-07-12 LAB — SURGICAL PCR SCREEN
MRSA, PCR: NEGATIVE
Staphylococcus aureus: POSITIVE — AB

## 2020-07-12 NOTE — H&P (Signed)
TOTAL KNEE ADMISSION H&P  Patient is being admitted for left total knee arthroplasty.  Subjective:  Chief Complaint:  Left knee primary OA / pain  HPI: [name], 64 y.o. female, has a history of pain and functional disability in the left knee due to arthritis and has failed non-surgical conservative treatments for greater than 12 weeks to include NSAID's and/or analgesics, corticosteriod injections, viscosupplementation injections, use of assistive devices and activity modification.  Onset of symptoms was gradual, starting  years ago with gradually worsening course since that time. The patient noted prior procedures on the knee to include  arthroplasty on the right knee(s).  Patient currently rates pain in the left knee(s) at 7 out of 10 with activity. Patient has worsening of pain with activity and weight bearing, pain that interferes with activities of daily living, pain with passive range of motion, crepitus and joint swelling.  Patient has evidence of periarticular osteophytes and joint space narrowing by imaging studies.  There is no active infection.  Risks, benefits and expectations were discussed with the patient.  Risks including but not limited to the risk of anesthesia, blood clots, nerve damage, blood vessel damage, failure of the prosthesis, infection and up to and including death.  Patient understand the risks, benefits and expectations and wishes to proceed with surgery.   PCP: Maurice Small, MD  D/C Plans:       Home  Post-op Meds:       No Rx given   Tranexamic Acid:      To be given - IV   Decadron:      Is to be given  FYI:      ASA  Norco  DME:   Pt already has equipment   PT:   OPPT arranged  [facility] [place].    Patient Active Problem List   Diagnosis Date Noted   Colitis presumed infectious 02/17/2020   Liver lesion 02/17/2020   S/P right TKA 02/10/2020   Low back pain 11/27/2018   Acute coronary syndrome ([place]) 04/02/2018    Hyperlipidemia 08/06/2017   Influenza A 01/01/2017   COPD exacerbation (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 CAD   (STRESS TEST NOV. 2011   DDD (degenerative disc disease)    Depression    DJD (degenerative joint disease)    JOINT PAIN   Dyspnea    "anytime but mostly on exertion and smoking"   Dysrhythmia 2017   SVT   Elevated liver enzymes    Fibromyalgia  Frequency of urination    GERD (gastroesophageal reflux disease)     W/ NEXIUM   Heart murmur    Hemorrhoids    High cholesterol    History of blood transfusion    "related to anemia" (04/03/2018)   History of carpal tunnel syndrome    Right   History of gastric ulcer 2004   Hypertension    IBS (irritable bowel syndrome)    Incisional pain s/p interstim implant 1st stage--- 12-07-11    left upper buttock-- pt states dressing clean dry and intact (on 12-08-11)   Insomnia    Iron deficiency anemia 2004   Leukocytosis    Nocturia    Non-productive cough    Numbness in both hands AT TIMES   OSA (obstructive sleep apnea)    "suppose  to wear mask; I don't" (04/03/2018)   PONV (postoperative nausea and vomiting)    SVT (supraventricular tachycardia) (HCC)    Urge urinary incontinence     Past Surgical History:  Procedure Laterality Date   ANTERIOR CERVICAL DECOMP/DISCECTOMY FUSION  2008   C4 - T1 ("screws from 1st OR came out")   ANTERIOR CERVICAL DECOMP/DISCECTOMY FUSION  2002   C4 - 7   ANTERIOR LATERAL LUMBAR FUSION WITH PERCUTANEOUS SCREW 2 LEVEL Left 11/27/2018   Procedure: XLIF Lumbar 3-5, posterior spinal fusion L3-5;  Surgeon: Melina Schools, MD;  Location: [facility];  Service: Orthopedics;  Laterality: Left;  5.5 hrs for entire procedure   [facility] CATHETERIZATION  2003   CARDIAC CATHETERIZATION N/A 08/23/2016   Procedure: Left Heart Cath and Coronary Angiography;  Surgeon: Dixie Dials, MD;  Location: [facility];  Service: Cardiovascular;  Laterality: N/A;   CARPAL TUNNEL RELEASE Right    CATARACT EXTRACTION W/ INTRAOCULAR LENS  IMPLANT, BILATERAL  2016-2018   CHOLECYSTECTOMY OPEN  1982   COLONOSCOPY     CYSTO/ HOD/ BLADDER BX  2006   CYSTO/ HOD/ BLADDER BX/ FULGERATION  06-12-2011   FINGER SURGERY Right 2001   "replaced worn cartilage on thumb w/tendons"   HYSTEROSCOPY WITH D & C  2005   INTERSTIM IMPLANT PLACEMENT  12/07/2011   Procedure: Barrie Lyme IMPLANT FIRST STAGE;  Surgeon: [name], MD;  Location: [facility];  Service: Urology;  Laterality: Right;   INTERSTIM IMPLANT PLACEMENT  12/14/2011   Procedure: Barrie Lyme IMPLANT SECOND STAGE;  Surgeon: [name], MD;  Location: [facility];  Service: Urology;  Laterality: Right;  rad tech ok by vickie at main   LUMBAR LAMINECTOMY  2007   L3 - 5   TONSILLECTOMY AND ADENOIDECTOMY  1965   TOTAL KNEE ARTHROPLASTY Right 02/10/2020   Procedure: TOTAL KNEE ARTHROPLASTY;  Surgeon: Paralee Cancel, MD;  Location: [facility];  Service: Orthopedics;  Laterality:  Right;  70 mins   TUBAL LIGATION     UPPER GI ENDOSCOPY     WRIST SURGERY Left    TFCC ("tendon repair")    No current facility-administered medications for this encounter.   Current Outpatient Medications  Medication Sig Dispense Refill Last Dose   acetaminophen (TYLENOL) 500 MG tablet Take 2 tablets (1,000 mg total) by mouth every 8 (eight) hours. 30 tablet 0    albuterol (PROVENTIL HFA;VENTOLIN HFA) 108 (90 BASE) MCG/ACT inhaler Inhale 2 puffs into the lungs every 6 (six) hours as needed for wheezing or shortness of breath.      aspirin 81 MG chewable  tablet Chew 81 mg by mouth in the morning and at bedtime.       clonazePAM (KLONOPIN) 1 MG tablet Take 1 tablet (1 mg total) by mouth at bedtime. 30 tablet 5    DULoxetine (CYMBALTA) 60 MG capsule Take 60 mg by mouth at bedtime.       hydrALAZINE (APRESOLINE) 25 MG tablet Take 25 mg by mouth daily.       metoprolol tartrate (LOPRESSOR) 25 MG tablet Take 0.5 tablets (12.5 mg total) by mouth 2 (two) times daily. 30 tablet 3    nitroGLYCERIN (NITROSTAT) 0.4 MG SL tablet Place 1 tablet (0.4 mg total) under the tongue every 5 (five) minutes x 3 doses as needed for chest pain. 10 tablet 0    pantoprazole (PROTONIX) 40 MG tablet Take 1 tablet (40 mg total) by mouth 2 (two) times daily. 60 tablet 0    Vitamin D, Ergocalciferol, (DRISDOL) 1.25 MG (50000 UNIT) CAPS capsule Take 50,000 Units by mouth every Wednesday.      diltiazem (CARDIZEM CD) 120 MG 24 hr capsule TAKE 1 CAPSULE (120 MG TOTAL) BY MOUTH DAILY. PLEASE SCHEDULE APPT FOR FUTURE REFILLS. 1ST ATTEMPT 90 capsule 3    polyethylene glycol (MIRALAX / GLYCOLAX) 17 g packet Take 17 g by mouth 2 (two) times daily. (Patient not taking: Reported on 06/29/2020) 28 packet 0 Not Taking at Unknown time   tiZANidine (ZANAFLEX) 4 MG tablet Take 1 tablet (4 mg total) by mouth 3 (three) times daily as needed for muscle spasms. (Patient not taking: Reported on 06/29/2020) 30 tablet 0 Not Taking  at Unknown time   Allergies  Allergen Reactions   Midazolam Hcl Other (See Comments)    HYPER   Prednisone Other (See Comments)    HYPER, depressed, moody   Statins Other (See Comments)    Causes muscle weakness and joint pain   Ace Inhibitors Other (See Comments)    DECREASES HEARTRATE   Amoxicillin Diarrhea    Did it involve swelling of the face/tongue/throat, SOB, or low BP? No Did it involve sudden or severe rash/hives, skin peeling, or any reaction on the inside of your mouth or nose? No Did you need to seek medical attention at a hospital or doctor's office? No When did it last happen?~2019 or so If all above answers are NO, may proceed with cephalosporin use.    Nsaids Other (See Comments)    AVOIDS; HX GASTRIC ULCER   Sulfa Antibiotics Rash and Other (See Comments)    JOINT PAIN    Social History   Tobacco Use   Smoking status: Current Every Day Smoker    Packs/day: 2.00    Years: 43.00    Pack years: 86.00    Types: Cigarettes   Smokeless tobacco: Never Used  Substance Use Topics   Alcohol use: Not Currently    Alcohol/week: 0.0 standard drinks    Comment: 04/03/2018 "nothing in years"    Family History  Problem Relation Age of Onset   Hypertension Father    Heart disease Father    Kidney failure Father    Breast cancer Mother    Hypertension Mother    Coronary artery disease Mother    Alzheimer's disease Mother    Hypertension Brother    Hypertension Sister    Hypertension Daughter      Review of Systems  Constitutional: Negative.   HENT: Negative.   Eyes: Negative.   Respiratory: Negative.   Cardiovascular: Negative.   Gastrointestinal: Positive for  heartburn.  Genitourinary: Negative.   Musculoskeletal: Positive for joint pain.  Skin: Negative.   Neurological: Positive for headaches.  Endo/Heme/Allergies: Negative.   Psychiatric/Behavioral: Negative.      Objective:  Physical Exam Constitutional:       Appearance: She is well-developed.  HENT:     Head: Normocephalic.  Eyes:     Pupils: Pupils are equal, round, and reactive to light.  Neck:     Thyroid: No thyromegaly.     Vascular: No JVD.     Trachea: No tracheal deviation.  Cardiovascular:     Rate and Rhythm: Normal rate and regular rhythm.     Heart sounds: Murmur heard.   Pulmonary:     Effort: Pulmonary effort is normal. No respiratory distress.     Breath sounds: Normal breath sounds. No wheezing.  Abdominal:     Palpations: Abdomen is soft.     Tenderness: There is no abdominal tenderness. There is no guarding.  Musculoskeletal:     Cervical back: Neck supple.     Left knee: Swelling and bony tenderness present. No erythema or ecchymosis. Decreased range of motion. Tenderness present.  Lymphadenopathy:     Cervical: No cervical adenopathy.  Skin:    General: Skin is warm and dry.  Neurological:     Mental Status: She is alert and oriented to person, place, and time.     Vital signs in last 24 hours: Temp:  [98.3 F (36.8 C)] 98.3 F (36.8 C) (07/26 1404) Pulse Rate:  [62] 62 (07/26 1404) Resp:  [18] 18 (07/26 1404) BP: (147)/(63) 147/63 (07/26 1404) SpO2:  [98 %] 98 % (07/26 1404) Weight:  [74 kg] 74 kg (07/26 1404)  Labs:   Estimated body mass index is 32.97 kg/m as calculated from the following:   Height as of 07/12/20: 4\' 11"  (1.499 m).   Weight as of 07/12/20: 74 kg.   Imaging Review Plain radiographs demonstrate severe degenerative joint disease of the left knee.  The bone quality appears to be good for age and reported activity level.      Assessment/Plan:  End stage arthritis, left knee   The patient history, physical examination, clinical judgment of the provider and imaging studies are consistent with end stage degenerative joint disease of the left knee and total knee arthroplasty is deemed medically necessary. The treatment options including medical management, injection therapy  arthroscopy and arthroplasty were discussed at length. The risks and benefits of total knee arthroplasty were presented and reviewed. The risks due to aseptic loosening, infection, stiffness, patella tracking problems, thromboembolic complications and other imponderables were discussed. The patient acknowledged the explanation, agreed to proceed with the plan and consent was signed. Patient is being admitted for treatment for surgery, pain control, PT, OT, prophylactic antibiotics, VTE prophylaxis, progressive ambulation and ADL's and discharge planning. The patient is planning to be discharged home.     Patient's anticipated LOS is less than 2 midnights, meeting these requirements: - Lives within 1 hour of care - Has a competent adult at home to recover with post-op recover - NO history of  - Chronic pain requiring opiods  - Diabetes  - Heart failure  - Heart attack  - Stroke  - DVT/VTE  - Cardiac arrhythmia  - Respiratory Failure/COPD  - Anemia  - Advanced Liver disease

## 2020-07-12 NOTE — Progress Notes (Signed)
COVID Vaccine Completed:Yes Date COVID Vaccine completed:06/09/20 COVID vaccine manufacturer: [place]   PCP - Dr. Beckie Salts Cardiologist - [name]  Chest x-ray - 08/02/19 EKG - 02/18/20 Stress Test - 07/04/19 ECHO - 2017 Cardiac Cath - '03,2017  Sleep Study - yes CPAP - no  Fasting Blood Sugar - NA Checks Blood Sugar _____ times a day  Blood Thinner Instructions:ASA /Dr. Justin Mend Aspirin Instructions:Stop 7 days prior to DOS/ Alvan Dame Last Dose:07/13/20  Anesthesia review:   Patient denies shortness of breath, fever, cough and chest pain at PAT appointment yes   Patient verbalized understanding of instructions that were given to them at the PAT appointment. Patient was also instructed that they will need to review over the PAT instructions again at home before surgery. Yes  Pt is a long time heavy smoker and is SOB most of the time while moving around. She takes 5-10 minutes to catch her breath.  The pt states that that is normal for her.   She has sleep apnea but doesn't use a c-pap. She has an interstim device from 2012. I asked her to bring the remote.

## 2020-07-14 NOTE — Progress Notes (Signed)
Anesthesia Chart Review   Case: [id number] Date/Time: 07/20/20 0700   Procedure: TOTAL KNEE ARTHROPLASTY (Left Knee)   Anesthesia type: Spinal   Pre-op diagnosis: Left knee osteoarthritis   Location: [facility] 09 / [facility]   Surgeons: Paralee Cancel, MD      DISCUSSION:64 y.o. current every day smoker (86 pack years) with h/o GERD, PONV, HTN, nonobstructive CAD by cath 2017, SVTCOPD, OSA, left knee OA scheduled for above procedure 07/20/2020 with Dr. Paralee Cancel.   Posterior spinal fusion with hardware in place L3-5.  Pt with h/o agoraphobia.  S/p ACDF 2002, 2008.  Last CT lumbar spine 11/2019: Interval pedicle screw and interbody fusion at L3-4 and L4-5 without significant residual spinal stenosis. Mild subarticular stenosis on the right at L4-5 unchanged.  Severe spinal stenosis L5-S1 has progressed. This is the level below the previous fusion.  Pt last seen by EP 05/26/2020, stable at this visit.  Per OV note SVT without sx to suggest recurrence, on metoprolol.  Advised to follow up prn.    Pt last seen by cardiologist, [name], 05/05/2020.  Stable at this visit.  Per OV note, 'May undergo next knee surgery in August."  S/p right knee arthroplasty 02/10/2020, spinal/MAC. No anesthesia complications noted.    Anticipate pt can proceed with planned procedure barring acute status change.   VS: BP (!) 147/63   Pulse 62   Temp 36.8 C (Oral)   Resp 18   Ht _0  (1.499 m)   Wt 74 kg   LMP 09/06/2005   SpO2 98%   BMI 32.97 kg/m   PROVIDERS: Maurice Small, MD is PCP   Dixie Dials, MD is Cardiologist   Curt Bears, Will, MD is EP LABS: Labs reviewed: Acceptable for surgery. (all labs ordered are listed, but only abnormal results are displayed)  Labs Reviewed  SURGICAL PCR SCREEN - Abnormal; Notable for the following components:      Result Value   Staphylococcus aureus POSITIVE (*)    All other components within normal limits  CBC - Abnormal; Notable for the following components:    WBC 29.2 (*)    HCT 47.2 (*)    Platelets 436 (*)    All other components within normal limits  BASIC METABOLIC PANEL - Abnormal; Notable for the following components:   Glucose, Bld 123 (*)    BUN 30 (*)    All other components within normal limits  TYPE AND SCREEN     IMAGES:   EKG: 02/18/2020 Rate 68 bpm Sinus rhythm  Anterior infarct, old Abnormal T, consider ischemia lateral leads Baseline wander in leads V6  CV: Myocardial Imaging 07/04/2019 IMPRESSION: 1. No reversible ischemia. Stable anteroapical and apicoseptal defects. 2. Marked septal hypokinesis. 3. Left ventricular ejection fraction 43  % 4. Non invasive risk stratification*: Intermediate  Echo 08/24/2016 Study Conclusions   - Left ventricle: The cavity size was normal. There was mild  concentric hypertrophy. Systolic function was normal. The  estimated ejection fraction was in the range of 60% to 65%. Wall  motion was normal; there were no regional wall motion  abnormalities. Doppler parameters are consistent with abnormal  left ventricular relaxation (grade 1 diastolic dysfunction).  - Mitral valve: Calcified annulus.   Cardiac Cath 08/23/2016  Prox RCA lesion, 45 %stenosed.  Ost LAD to Prox LAD lesion, 25 %stenosed.  Mid LAD to Dist LAD lesion, 40 %stenosed.  Dist LAD lesion, 60 %stenosed.  Ost 1st Mrg lesion, 20 %stenosed.  The left ventricular  systolic function is normal.  LV end diastolic pressure is normal.  Ost 2nd Mrg lesion, 90 %stenosed.   Life style modification. Medical treatment as OM 2 is relatively small size vessel.  Past Medical History:  Diagnosis Date  . Agoraphobia without history of panic disorder   . Anginal pain ([place])   . Anxiety   . Arthritis    "qwhere" (04/03/2018)  . Childhood asthma   . Chronic bronchitis ([place])   . Chronic lower back pain   . COPD (chronic obstructive pulmonary disease) ([place])   . Coronary artery disease CARDIOLOGIST- DR  Doylene Canard   (VISIT 03-30-11 W/ CHART)   NON-OBS. CAD   (STRESS TEST NOV. 2011  . DDD (degenerative disc disease)   . Depression   . DJD (degenerative joint disease)    JOINT PAIN  . Dyspnea    "anytime but mostly on exertion and smoking"  . Dysrhythmia 2017   SVT  . Elevated liver enzymes   . Fibromyalgia   . Frequency of urination   . GERD (gastroesophageal reflux disease)     W/ NEXIUM  . Heart murmur   . Hemorrhoids   . High cholesterol   . History of blood transfusion    "related to anemia" (04/03/2018)  . History of carpal tunnel syndrome    Right  . History of gastric ulcer 2004  . Hypertension   . IBS (irritable bowel syndrome)   . Incisional pain s/p interstim implant 1st stage--- 12-07-11    left upper buttock-- pt states dressing clean dry and intact (on 12-08-11)  . Insomnia   . Iron deficiency anemia 2004  . Leukocytosis   . Nocturia   . Non-productive cough   . Numbness in both hands AT TIMES  . OSA (obstructive sleep apnea)    "suppose to wear mask; I don't" (04/03/2018)  . PONV (postoperative nausea and vomiting)   . SVT (supraventricular tachycardia) ([place])   . Urge urinary incontinence     Past Surgical History:  Procedure Laterality Date  . ANTERIOR CERVICAL DECOMP/DISCECTOMY FUSION  2008   C4 - T1 ("screws from 1st OR came out")  . ANTERIOR CERVICAL DECOMP/DISCECTOMY FUSION  2002   C4 - 7  . ANTERIOR LATERAL LUMBAR FUSION WITH PERCUTANEOUS SCREW 2 LEVEL Left 11/27/2018   Procedure: XLIF Lumbar 3-5, posterior spinal fusion L3-5;  Surgeon: Melina Schools, MD;  Location: [place];  Service: Orthopedics;  Laterality: Left;  5.5 hrs for entire procedure  . APPENDECTOMY  1982  . BACK SURGERY    . CARDIAC CATHETERIZATION  2003  . CARDIAC CATHETERIZATION N/A 08/23/2016   Procedure: Left Heart Cath and Coronary Angiography;  Surgeon: Dixie Dials, MD;  Location: [place] [facility];  Service: Cardiovascular;  Laterality: N/A;  . CARPAL TUNNEL RELEASE Right   . CATARACT  EXTRACTION W/ INTRAOCULAR LENS  IMPLANT, BILATERAL  2016-2018  . CHOLECYSTECTOMY OPEN  1982  . COLONOSCOPY    . CYSTO/ HOD/ BLADDER BX  2006  . CYSTO/ HOD/ BLADDER BX/ FULGERATION  06-12-2011  . FINGER SURGERY Right 2001   "replaced worn cartilage on thumb w/tendons"  . HYSTEROSCOPY WITH D & C  2005  . INTERSTIM IMPLANT PLACEMENT  12/07/2011   Procedure: Barrie Lyme IMPLANT FIRST STAGE;  Surgeon: [name], MD;  Location: [facility];  Service: Urology;  Laterality: Right;  . INTERSTIM IMPLANT PLACEMENT  12/14/2011   Procedure: Barrie Lyme IMPLANT SECOND STAGE;  Surgeon: [name], MD;  Location: [facility];  Service: Urology;  Laterality: Right;  rad tech ok by vickie at [facility]  . LUMBAR LAMINECTOMY  2007   L3 - 5  . TONSILLECTOMY AND ADENOIDECTOMY  1965  . TOTAL KNEE ARTHROPLASTY Right 02/10/2020   Procedure: TOTAL KNEE ARTHROPLASTY;  Surgeon: Paralee Cancel, MD;  Location: [facility];  Service: Orthopedics;  Laterality: Right;  70 mins  . TUBAL LIGATION    . UPPER GI ENDOSCOPY    . WRIST SURGERY Left    TFCC ("tendon repair")    MEDICATIONS: . acetaminophen (TYLENOL) 500 MG tablet  . albuterol (PROVENTIL HFA;VENTOLIN HFA) 108 (90 BASE) MCG/ACT inhaler  . aspirin 81 MG chewable tablet  . clonazePAM (KLONOPIN) 1 MG tablet  . diltiazem (CARDIZEM CD) 120 MG 24 hr capsule  . DULoxetine (CYMBALTA) 60 MG capsule  . hydrALAZINE (APRESOLINE) 25 MG tablet  . metoprolol tartrate (LOPRESSOR) 25 MG tablet  . nitroGLYCERIN (NITROSTAT) 0.4 MG SL tablet  . pantoprazole (PROTONIX) 40 MG tablet  . polyethylene glycol (MIRALAX / GLYCOLAX) 17 g packet  . tiZANidine (ZANAFLEX) 4 MG tablet  . Vitamin D, Ergocalciferol, (DRISDOL) 1.25 MG (50000 UNIT) CAPS capsule   No current facility-administered medications for this encounter.     Konrad Felix, PA-C [facility] [phone]

## 2020-07-14 NOTE — Anesthesia Preprocedure Evaluation (Addendum)
Anesthesia Evaluation  Patient identified by MRN, date of birth, ID band Patient awake    Reviewed: Allergy & Precautions, NPO status , Patient's Chart, lab work & pertinent test results  Airway Mallampati: II  TM Distance: >3 FB Neck ROM: Full    Dental no notable dental hx.    Pulmonary asthma , sleep apnea , Current Smoker,    Pulmonary exam normal breath sounds clear to auscultation + decreased breath sounds      Cardiovascular hypertension, Normal cardiovascular exam+ dysrhythmias Supra Ventricular Tachycardia  Rhythm:Regular Rate:Normal     Neuro/Psych negative neurological ROS  negative psych ROS   GI/Hepatic negative GI ROS, Neg liver ROS,   Endo/Other  negative endocrine ROS  Renal/GU negative Renal ROS  negative genitourinary   Musculoskeletal  (+) Arthritis , Osteoarthritis,    Abdominal   Peds negative pediatric ROS (+)  Hematology negative hematology ROS (+)   Anesthesia Other Findings   Reproductive/Obstetrics negative OB ROS                            Anesthesia Physical Anesthesia Plan  ASA: III  Anesthesia Plan: Spinal   Post-op Pain Management:  Regional for Post-op pain   Induction: Intravenous  PONV Risk Score and Plan: 2 and Ondansetron, Dexamethasone and Treatment may vary due to age or medical condition  Airway Management Planned: Simple Face Mask  Additional Equipment:   Intra-op Plan:   Post-operative Plan:   Informed Consent: I have reviewed the patients History and Physical, chart, labs and discussed the procedure including the risks, benefits and alternatives for the proposed anesthesia with the patient or authorized representative who has indicated his/her understanding and acceptance.     Dental advisory given  Plan Discussed with: CRNA and Surgeon  Anesthesia Plan Comments: (See PAT note 07/12/2020, Konrad Felix, PA-C)        Anesthesia Quick Evaluation

## 2020-07-16 ENCOUNTER — Other Ambulatory Visit (HOSPITAL_COMMUNITY)
Admission: RE | Admit: 2020-07-16 | Discharge: 2020-07-16 | Disposition: A | Discharge status: Home / Self Care | Payer: Medicare Other | Source: Ambulatory Visit | Attending: Orthopedic Surgery | Admitting: Orthopedic Surgery

## 2020-07-16 DIAGNOSIS — Z01812 Encounter for preprocedural laboratory examination: Secondary | ICD-10-CM | POA: Insufficient documentation

## 2020-07-16 DIAGNOSIS — Z20822 Contact with and (suspected) exposure to covid-19: Secondary | ICD-10-CM | POA: Diagnosis not present

## 2020-07-16 LAB — SARS CORONAVIRUS 2 (TAT 6-24 HRS): SARS Coronavirus 2: NEGATIVE

## 2020-07-20 ENCOUNTER — Other Ambulatory Visit: Payer: Self-pay

## 2020-07-20 ENCOUNTER — Ambulatory Visit (HOSPITAL_COMMUNITY): Payer: Medicare Other | Admitting: Physician Assistant

## 2020-07-20 ENCOUNTER — Ambulatory Visit (HOSPITAL_COMMUNITY): Payer: Medicare Other | Admitting: Certified Registered Nurse Anesthetist

## 2020-07-20 ENCOUNTER — Encounter (HOSPITAL_COMMUNITY)
Admission: RE | Disposition: A | Discharge status: Home / Self Care | Payer: Self-pay | Source: Home / Self Care | Attending: Orthopedic Surgery

## 2020-07-20 ENCOUNTER — Encounter (HOSPITAL_COMMUNITY): Payer: Self-pay | Admitting: Orthopedic Surgery

## 2020-07-20 ENCOUNTER — Observation Stay (HOSPITAL_COMMUNITY)
Admission: RE | Admit: 2020-07-20 | Discharge: 2020-07-21 | Disposition: A | Discharge status: Home / Self Care | Payer: Medicare Other | Attending: Orthopedic Surgery | Admitting: Orthopedic Surgery

## 2020-07-20 DIAGNOSIS — I25110 Atherosclerotic heart disease of native coronary artery with unstable angina pectoris: Secondary | ICD-10-CM | POA: Diagnosis not present

## 2020-07-20 DIAGNOSIS — J441 Chronic obstructive pulmonary disease with (acute) exacerbation: Secondary | ICD-10-CM | POA: Insufficient documentation

## 2020-07-20 DIAGNOSIS — Z20822 Contact with and (suspected) exposure to covid-19: Secondary | ICD-10-CM | POA: Diagnosis not present

## 2020-07-20 DIAGNOSIS — F17210 Nicotine dependence, cigarettes, uncomplicated: Secondary | ICD-10-CM | POA: Diagnosis not present

## 2020-07-20 DIAGNOSIS — G8918 Other acute postprocedural pain: Secondary | ICD-10-CM | POA: Diagnosis not present

## 2020-07-20 DIAGNOSIS — M25562 Pain in left knee: Secondary | ICD-10-CM | POA: Diagnosis present

## 2020-07-20 DIAGNOSIS — Z7982 Long term (current) use of aspirin: Secondary | ICD-10-CM | POA: Insufficient documentation

## 2020-07-20 DIAGNOSIS — I119 Hypertensive heart disease without heart failure: Secondary | ICD-10-CM | POA: Diagnosis not present

## 2020-07-20 DIAGNOSIS — Z79899 Other long term (current) drug therapy: Secondary | ICD-10-CM | POA: Diagnosis not present

## 2020-07-20 DIAGNOSIS — M25762 Osteophyte, left knee: Secondary | ICD-10-CM | POA: Diagnosis not present

## 2020-07-20 DIAGNOSIS — Z96651 Presence of right artificial knee joint: Secondary | ICD-10-CM | POA: Diagnosis not present

## 2020-07-20 DIAGNOSIS — Z96652 Presence of left artificial knee joint: Secondary | ICD-10-CM

## 2020-07-20 DIAGNOSIS — M1712 Unilateral primary osteoarthritis, left knee: Secondary | ICD-10-CM | POA: Diagnosis not present

## 2020-07-20 DIAGNOSIS — J45909 Unspecified asthma, uncomplicated: Secondary | ICD-10-CM | POA: Diagnosis not present

## 2020-07-20 DIAGNOSIS — Z96659 Presence of unspecified artificial knee joint: Secondary | ICD-10-CM

## 2020-07-20 DIAGNOSIS — M25462 Effusion, left knee: Secondary | ICD-10-CM | POA: Diagnosis not present

## 2020-07-20 HISTORY — PX: TOTAL KNEE ARTHROPLASTY: SHX125

## 2020-07-20 LAB — TYPE AND SCREEN
ABO/RH(D): A NEG
Antibody Screen: NEGATIVE

## 2020-07-20 IMAGING — CR CHEST - 2 VIEW
2 series · 2 of 2 positions shown · non-contrast
Comparison: Radiograph 09/10/2018

CLINICAL DATA: Shortness of breath.

EXAM:
CHEST - 2 VIEW

[w chest pa]
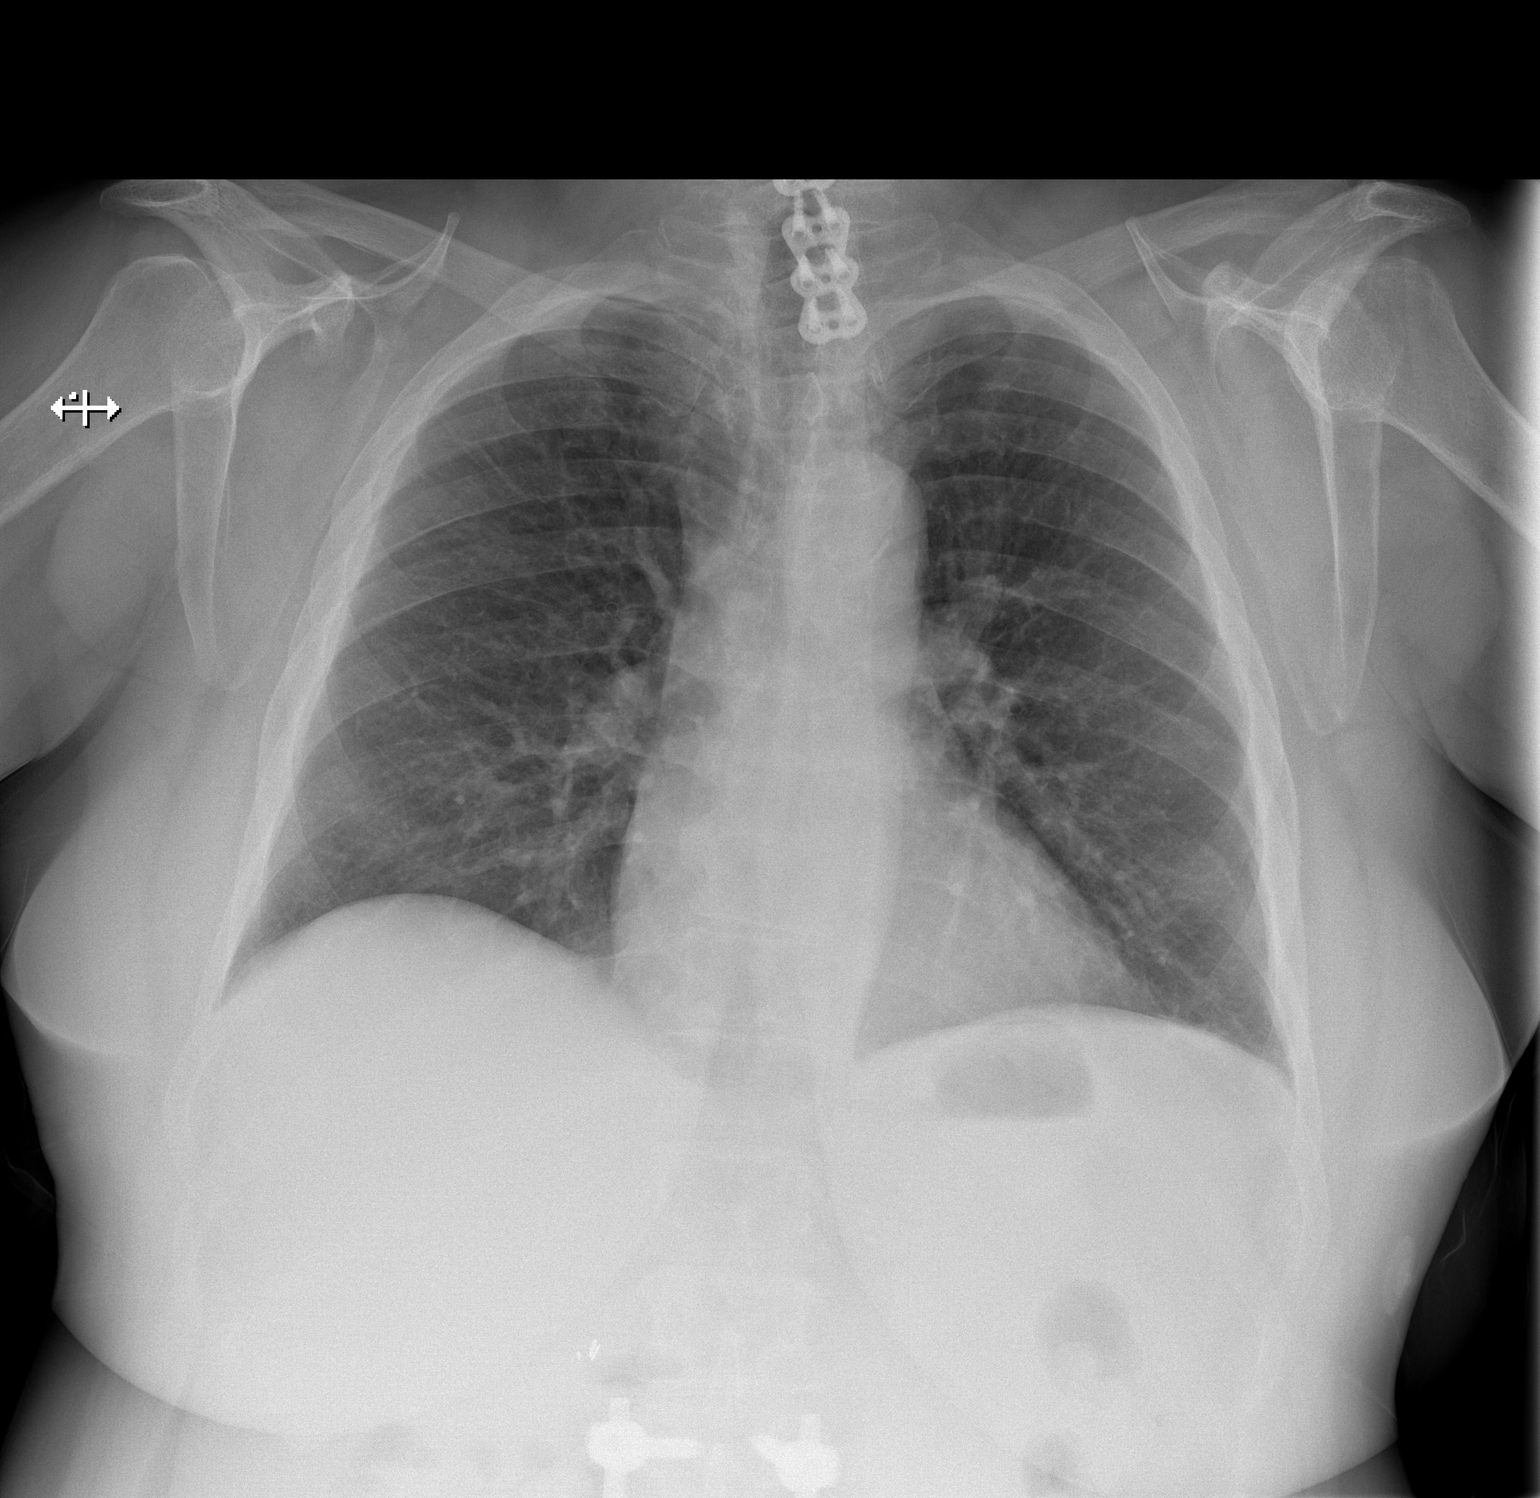

[w chest lat]
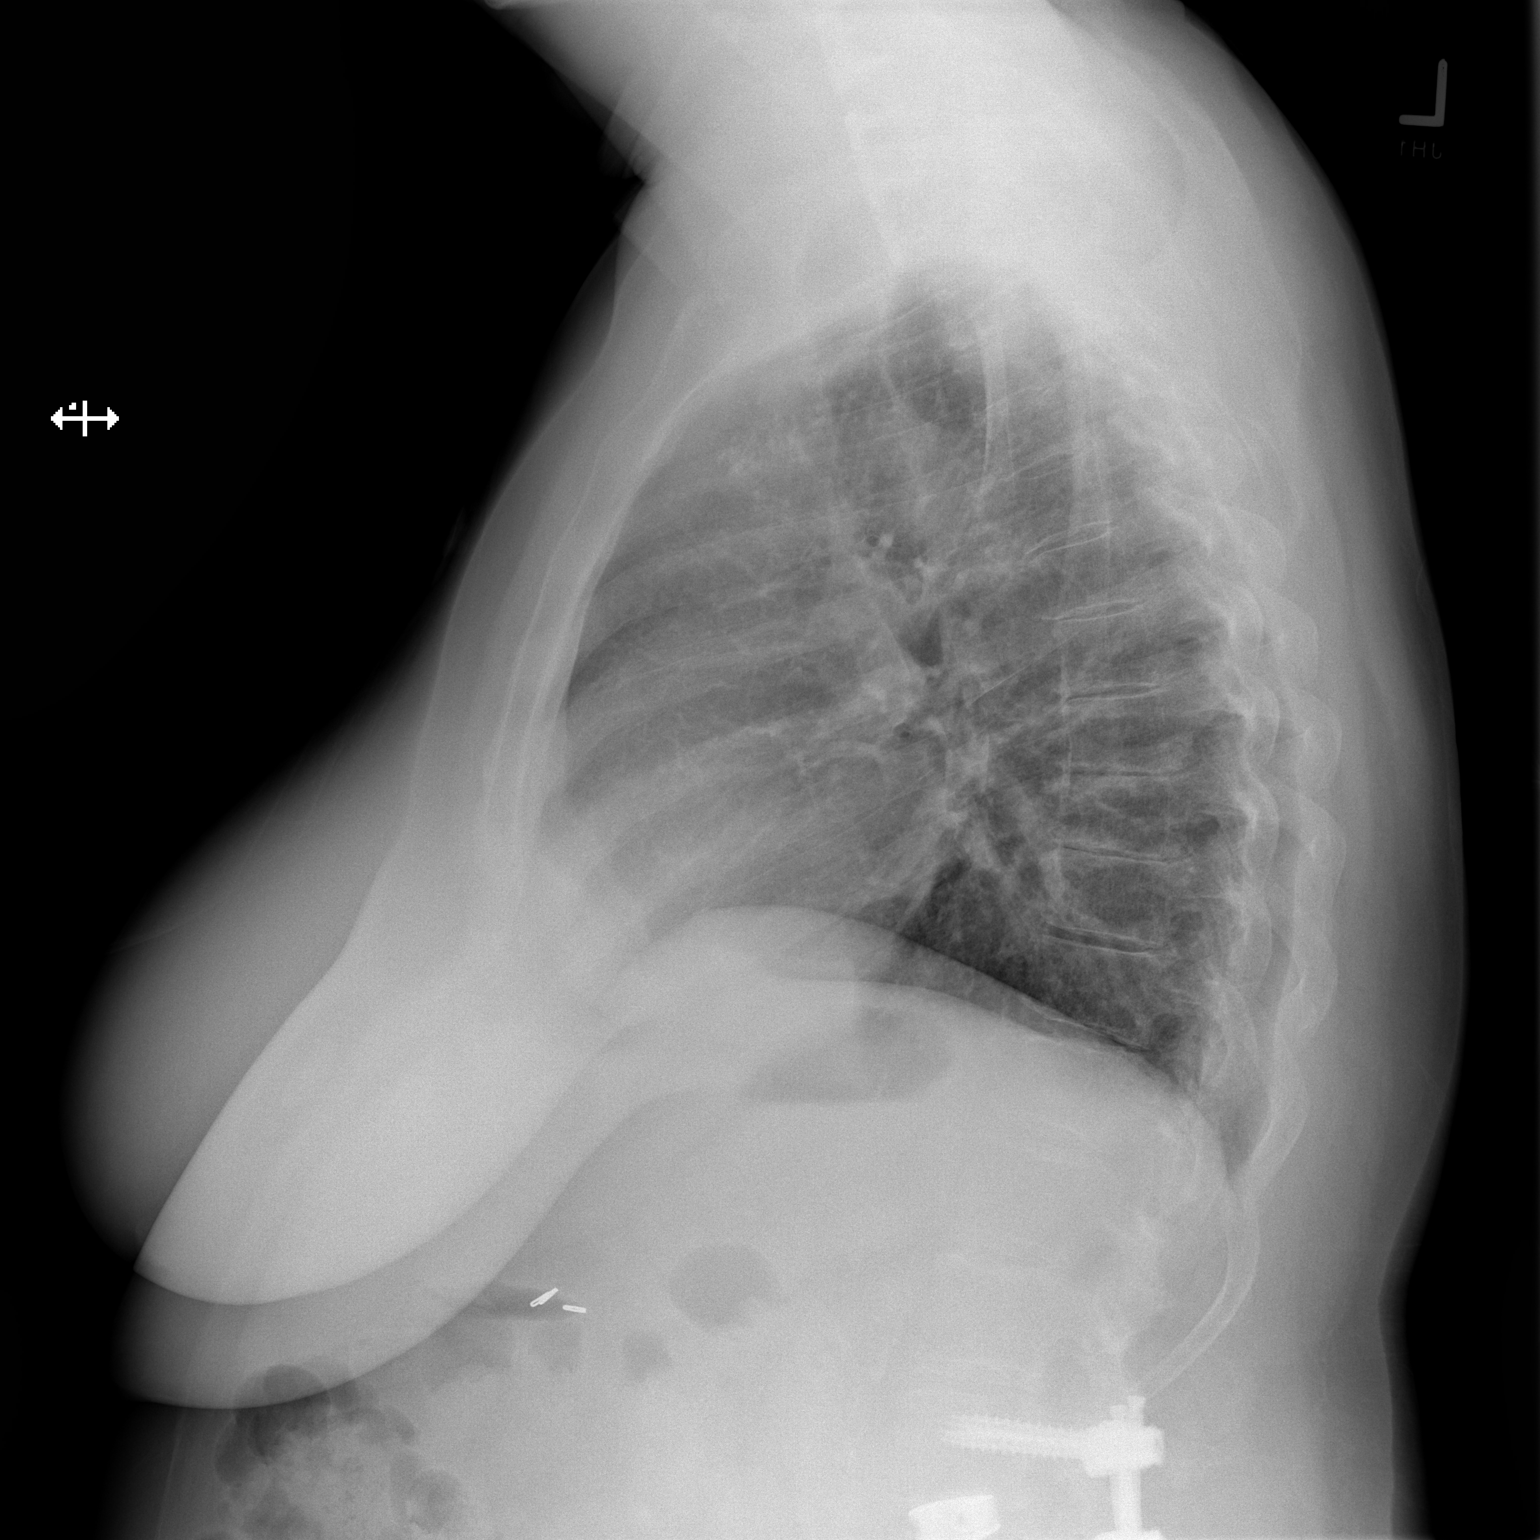

[2 of 2 positions shown; findings below may reference images not displayed]

FINDINGS: The cardiomediastinal contours are normal. Mild chronic interstitial
coarsening. Pulmonary vasculature is normal. No consolidation,
pleural effusion, or pneumothorax. No acute osseous abnormalities
are seen. Surgical hardware in the lower cervical spine and lumbar
spine is partially included.
IMPRESSION: No acute chest findings.

## 2020-07-20 SURGERY — ARTHROPLASTY, KNEE, TOTAL
Anesthesia: Spinal | Site: Knee | Laterality: Left

## 2020-07-20 MED ORDER — METOCLOPRAMIDE HCL 5 MG/ML IJ SOLN: 5.0000 mg | Freq: Three times a day (TID) | INTRAMUSCULAR | Status: DC | PRN

## 2020-07-20 MED ORDER — METOPROLOL TARTRATE 12.5 MG HALF TABLET
12.5000 mg | ORAL_TABLET | Freq: Two times a day (BID) | ORAL | Status: DC
  Administered 2020-07-20 – 2020-07-21 (×2): 12.5 mg via ORAL
  Filled 2020-07-20 (×2): qty 1

## 2020-07-20 MED ORDER — DILTIAZEM HCL ER COATED BEADS 120 MG PO CP24
120.0000 mg | ORAL_CAPSULE | Freq: Every day | ORAL | Status: DC
  Administered 2020-07-21: 120 mg via ORAL
  Filled 2020-07-20: qty 1

## 2020-07-20 MED ORDER — NICOTINE 21 MG/24HR TD PT24
21.0000 mg | MEDICATED_PATCH | Freq: Every day | TRANSDERMAL | Status: DC
  Administered 2020-07-20: 21 mg via TRANSDERMAL
  Filled 2020-07-20 (×2): qty 1

## 2020-07-20 MED ORDER — MENTHOL 3 MG MT LOZG
1.0000 | LOZENGE | OROMUCOSAL | Status: DC | PRN
  Administered 2020-07-20: 3 mg via ORAL
  Filled 2020-07-20: qty 9

## 2020-07-20 MED ORDER — ONDANSETRON HCL 4 MG/2ML IJ SOLN
INTRAMUSCULAR | Status: AC
  Filled 2020-07-20: qty 2

## 2020-07-20 MED ORDER — CHLORHEXIDINE GLUCONATE 0.12 % MT SOLN
15.0000 mL | Freq: Once | OROMUCOSAL | Status: AC
  Administered 2020-07-20: 15 mL via OROMUCOSAL

## 2020-07-20 MED ORDER — CEFAZOLIN SODIUM-DEXTROSE 2-4 GM/100ML-% IV SOLN
2.0000 g | INTRAVENOUS | Status: AC
  Administered 2020-07-20: 2 g via INTRAVENOUS
  Filled 2020-07-20: qty 100

## 2020-07-20 MED ORDER — CLONAZEPAM 1 MG PO TABS
1.0000 mg | ORAL_TABLET | Freq: Every day | ORAL | Status: DC
  Administered 2020-07-20: 1 mg via ORAL
  Filled 2020-07-20: qty 1

## 2020-07-20 MED ORDER — SODIUM CHLORIDE 0.9 % IR SOLN
Status: DC | PRN
  Administered 2020-07-20: 1000 mL

## 2020-07-20 MED ORDER — HYDROMORPHONE HCL 1 MG/ML IJ SOLN: 0.2500 mg | INTRAMUSCULAR | Status: DC | PRN

## 2020-07-20 MED ORDER — ASPIRIN 81 MG PO CHEW
81.0000 mg | CHEWABLE_TABLET | Freq: Two times a day (BID) | ORAL | Status: DC
  Administered 2020-07-20 – 2020-07-21 (×2): 81 mg via ORAL
  Filled 2020-07-20 (×2): qty 1

## 2020-07-20 MED ORDER — METHOCARBAMOL 500 MG IVPB - SIMPLE MED
500.0000 mg | Freq: Four times a day (QID) | INTRAVENOUS | Status: DC | PRN
  Filled 2020-07-20: qty 50

## 2020-07-20 MED ORDER — STERILE WATER FOR IRRIGATION IR SOLN
Status: DC | PRN
  Administered 2020-07-20 (×2): 1000 mL

## 2020-07-20 MED ORDER — DULOXETINE HCL 60 MG PO CPEP
60.0000 mg | ORAL_CAPSULE | Freq: Every day | ORAL | Status: DC
  Administered 2020-07-20: 60 mg via ORAL
  Filled 2020-07-20: qty 1

## 2020-07-20 MED ORDER — HYDROCODONE-ACETAMINOPHEN 7.5-325 MG PO TABS
1.0000 | ORAL_TABLET | ORAL | Status: DC | PRN
  Administered 2020-07-20 (×2): 1 via ORAL
  Administered 2020-07-20 – 2020-07-21 (×3): 2 via ORAL
  Filled 2020-07-20: qty 2
  Filled 2020-07-20: qty 1
  Filled 2020-07-20 (×2): qty 2
  Filled 2020-07-20: qty 1

## 2020-07-20 MED ORDER — ACETAMINOPHEN 325 MG PO TABS: 325.0000 mg | ORAL_TABLET | Freq: Four times a day (QID) | ORAL | Status: DC | PRN

## 2020-07-20 MED ORDER — LACTATED RINGERS IV SOLN: INTRAVENOUS | Status: DC

## 2020-07-20 MED ORDER — DEXAMETHASONE SODIUM PHOSPHATE 10 MG/ML IJ SOLN
10.0000 mg | Freq: Once | INTRAMUSCULAR | Status: AC
  Administered 2020-07-20: 10 mg via INTRAVENOUS

## 2020-07-20 MED ORDER — MAGNESIUM CITRATE PO SOLN: 1.0000 | Freq: Once | ORAL | Status: DC | PRN

## 2020-07-20 MED ORDER — BUPIVACAINE HCL (PF) 0.5 % IJ SOLN
INTRAMUSCULAR | Status: DC | PRN
Start: 2020-07-20 — End: 2020-07-20
  Administered 2020-07-20: 20 mL via PERINEURAL

## 2020-07-20 MED ORDER — PROPOFOL 500 MG/50ML IV EMUL
INTRAVENOUS | Status: DC | PRN
  Administered 2020-07-20: 75 ug/kg/min via INTRAVENOUS

## 2020-07-20 MED ORDER — NITROGLYCERIN 0.4 MG SL SUBL: 0.4000 mg | SUBLINGUAL_TABLET | SUBLINGUAL | Status: DC | PRN

## 2020-07-20 MED ORDER — KETOROLAC TROMETHAMINE 30 MG/ML IJ SOLN
INTRAMUSCULAR | Status: DC | PRN
  Administered 2020-07-20: 30 mg

## 2020-07-20 MED ORDER — SODIUM CHLORIDE (PF) 0.9 % IJ SOLN
INTRAMUSCULAR | Status: AC
  Filled 2020-07-20: qty 50

## 2020-07-20 MED ORDER — BUPIVACAINE-EPINEPHRINE (PF) 0.25% -1:200000 IJ SOLN
INTRAMUSCULAR | Status: AC
  Filled 2020-07-20: qty 30

## 2020-07-20 MED ORDER — FERROUS SULFATE 325 (65 FE) MG PO TABS
325.0000 mg | ORAL_TABLET | Freq: Two times a day (BID) | ORAL | Status: DC
  Administered 2020-07-21: 325 mg via ORAL
  Filled 2020-07-20: qty 1

## 2020-07-20 MED ORDER — FENTANYL CITRATE (PF) 100 MCG/2ML IJ SOLN
INTRAMUSCULAR | Status: AC
  Filled 2020-07-20: qty 2

## 2020-07-20 MED ORDER — PHENYLEPHRINE HCL (PRESSORS) 10 MG/ML IV SOLN
INTRAVENOUS | Status: AC
  Filled 2020-07-20: qty 1

## 2020-07-20 MED ORDER — ALUM & MAG HYDROXIDE-SIMETH 200-200-20 MG/5ML PO SUSP: 15.0000 mL | ORAL | Status: DC | PRN

## 2020-07-20 MED ORDER — METHOCARBAMOL 500 MG PO TABS
500.0000 mg | ORAL_TABLET | Freq: Four times a day (QID) | ORAL | Status: DC | PRN
  Administered 2020-07-20 – 2020-07-21 (×3): 500 mg via ORAL
  Filled 2020-07-20 (×3): qty 1

## 2020-07-20 MED ORDER — PANTOPRAZOLE SODIUM 40 MG PO TBEC
40.0000 mg | DELAYED_RELEASE_TABLET | Freq: Two times a day (BID) | ORAL | Status: DC
  Administered 2020-07-20 – 2020-07-21 (×2): 40 mg via ORAL
  Filled 2020-07-20 (×2): qty 1

## 2020-07-20 MED ORDER — TRANEXAMIC ACID-NACL 1000-0.7 MG/100ML-% IV SOLN
1000.0000 mg | Freq: Once | INTRAVENOUS | Status: AC
  Administered 2020-07-20: 1000 mg via INTRAVENOUS
  Filled 2020-07-20: qty 100

## 2020-07-20 MED ORDER — DEXAMETHASONE SODIUM PHOSPHATE 10 MG/ML IJ SOLN
10.0000 mg | Freq: Once | INTRAMUSCULAR | Status: AC
  Administered 2020-07-21: 10 mg via INTRAVENOUS
  Filled 2020-07-20: qty 1

## 2020-07-20 MED ORDER — DOCUSATE SODIUM 100 MG PO CAPS
100.0000 mg | ORAL_CAPSULE | Freq: Two times a day (BID) | ORAL | Status: DC
  Administered 2020-07-20 – 2020-07-21 (×2): 100 mg via ORAL
  Filled 2020-07-20 (×2): qty 1

## 2020-07-20 MED ORDER — BUPIVACAINE IN DEXTROSE 0.75-8.25 % IT SOLN
INTRATHECAL | Status: DC | PRN
  Administered 2020-07-20: 1.4 mL via INTRATHECAL

## 2020-07-20 MED ORDER — BISACODYL 10 MG RE SUPP: 10.0000 mg | Freq: Every day | RECTAL | Status: DC | PRN

## 2020-07-20 MED ORDER — ONDANSETRON HCL 4 MG/2ML IJ SOLN: 4.0000 mg | Freq: Four times a day (QID) | INTRAMUSCULAR | Status: DC | PRN

## 2020-07-20 MED ORDER — PHENYLEPHRINE HCL-NACL 10-0.9 MG/250ML-% IV SOLN
INTRAVENOUS | Status: DC | PRN
Start: 2020-07-20 — End: 2020-07-20
  Administered 2020-07-20: 10 ug/min via INTRAVENOUS

## 2020-07-20 MED ORDER — HYDRALAZINE HCL 25 MG PO TABS
25.0000 mg | ORAL_TABLET | Freq: Every day | ORAL | Status: DC
  Administered 2020-07-20 – 2020-07-21 (×2): 25 mg via ORAL
  Filled 2020-07-20 (×2): qty 1

## 2020-07-20 MED ORDER — GLYCOPYRROLATE PF 0.2 MG/ML IJ SOSY
PREFILLED_SYRINGE | INTRAMUSCULAR | Status: DC | PRN
  Administered 2020-07-20: .2 mg via INTRAVENOUS

## 2020-07-20 MED ORDER — DEXAMETHASONE SODIUM PHOSPHATE 10 MG/ML IJ SOLN
INTRAMUSCULAR | Status: AC
  Filled 2020-07-20: qty 1

## 2020-07-20 MED ORDER — PROPOFOL 10 MG/ML IV BOLUS
INTRAVENOUS | Status: AC
  Filled 2020-07-20: qty 20

## 2020-07-20 MED ORDER — METOCLOPRAMIDE HCL 5 MG PO TABS: 5.0000 mg | ORAL_TABLET | Freq: Three times a day (TID) | ORAL | Status: DC | PRN

## 2020-07-20 MED ORDER — ONDANSETRON HCL 4 MG PO TABS: 4.0000 mg | ORAL_TABLET | Freq: Four times a day (QID) | ORAL | Status: DC | PRN

## 2020-07-20 MED ORDER — POLYETHYLENE GLYCOL 3350 17 G PO PACK
17.0000 g | PACK | Freq: Two times a day (BID) | ORAL | Status: DC
  Filled 2020-07-20 (×2): qty 1

## 2020-07-20 MED ORDER — SODIUM CHLORIDE (PF) 0.9 % IJ SOLN
INTRAMUSCULAR | Status: DC | PRN
  Administered 2020-07-20: 30 mL

## 2020-07-20 MED ORDER — BUPIVACAINE-EPINEPHRINE (PF) 0.25% -1:200000 IJ SOLN
INTRAMUSCULAR | Status: DC | PRN
  Administered 2020-07-20: 30 mL via PERINEURAL

## 2020-07-20 MED ORDER — DIPHENHYDRAMINE HCL 12.5 MG/5ML PO ELIX: 12.5000 mg | ORAL_SOLUTION | ORAL | Status: DC | PRN

## 2020-07-20 MED ORDER — PROPOFOL 500 MG/50ML IV EMUL
INTRAVENOUS | Status: AC
  Filled 2020-07-20: qty 50

## 2020-07-20 MED ORDER — EPHEDRINE SULFATE-NACL 50-0.9 MG/10ML-% IV SOSY
PREFILLED_SYRINGE | INTRAVENOUS | Status: DC | PRN
  Administered 2020-07-20: 5 mg via INTRAVENOUS
  Administered 2020-07-20 (×2): 10 mg via INTRAVENOUS
  Administered 2020-07-20: 15 mg via INTRAVENOUS

## 2020-07-20 MED ORDER — LACTATED RINGERS IV BOLUS
500.0000 mL | Freq: Once | INTRAVENOUS | Status: AC
  Administered 2020-07-20: 500 mL via INTRAVENOUS

## 2020-07-20 MED ORDER — PROPOFOL 10 MG/ML IV BOLUS
INTRAVENOUS | Status: DC | PRN
  Administered 2020-07-20: 10 mg via INTRAVENOUS
  Administered 2020-07-20: 20 mg via INTRAVENOUS
  Administered 2020-07-20 (×2): 10 mg via INTRAVENOUS

## 2020-07-20 MED ORDER — ORAL CARE MOUTH RINSE: 15.0000 mL | Freq: Once | OROMUCOSAL | Status: AC

## 2020-07-20 MED ORDER — ONDANSETRON HCL 4 MG/2ML IJ SOLN
INTRAMUSCULAR | Status: DC | PRN
  Administered 2020-07-20: 4 mg via INTRAVENOUS

## 2020-07-20 MED ORDER — FENTANYL CITRATE (PF) 100 MCG/2ML IJ SOLN
INTRAMUSCULAR | Status: DC | PRN
  Administered 2020-07-20: 50 ug via INTRAVENOUS
  Administered 2020-07-20 (×2): 25 ug via INTRAVENOUS

## 2020-07-20 MED ORDER — MORPHINE SULFATE (PF) 2 MG/ML IV SOLN
0.5000 mg | INTRAVENOUS | Status: DC | PRN
  Administered 2020-07-21: 1 mg via INTRAVENOUS
  Filled 2020-07-20: qty 1

## 2020-07-20 MED ORDER — POVIDONE-IODINE 10 % EX SWAB
2.0000 "application " | Freq: Once | CUTANEOUS | Status: AC
  Administered 2020-07-20: 2 via TOPICAL

## 2020-07-20 MED ORDER — TRANEXAMIC ACID-NACL 1000-0.7 MG/100ML-% IV SOLN
1000.0000 mg | INTRAVENOUS | Status: AC
  Administered 2020-07-20: 1000 mg via INTRAVENOUS
  Filled 2020-07-20: qty 100

## 2020-07-20 MED ORDER — ALBUTEROL SULFATE (2.5 MG/3ML) 0.083% IN NEBU: 2.5000 mg | INHALATION_SOLUTION | Freq: Four times a day (QID) | RESPIRATORY_TRACT | Status: DC | PRN

## 2020-07-20 MED ORDER — PHENOL 1.4 % MT LIQD: 1.0000 | OROMUCOSAL | Status: DC | PRN

## 2020-07-20 MED ORDER — ALBUTEROL SULFATE HFA 108 (90 BASE) MCG/ACT IN AERS: 2.0000 | INHALATION_SPRAY | Freq: Four times a day (QID) | RESPIRATORY_TRACT | Status: DC | PRN

## 2020-07-20 MED ORDER — HYDROCODONE-ACETAMINOPHEN 5-325 MG PO TABS
1.0000 | ORAL_TABLET | ORAL | Status: DC | PRN
  Administered 2020-07-20: 1 via ORAL
  Filled 2020-07-20: qty 1

## 2020-07-20 MED ORDER — SODIUM CHLORIDE 0.9 % IV SOLN
INTRAVENOUS | Status: DC
  Administered 2020-07-20: 75 mL/h via INTRAVENOUS

## 2020-07-20 MED ORDER — CEFAZOLIN SODIUM-DEXTROSE 2-4 GM/100ML-% IV SOLN
2.0000 g | Freq: Four times a day (QID) | INTRAVENOUS | Status: AC
  Administered 2020-07-20 (×2): 2 g via INTRAVENOUS
  Filled 2020-07-20 (×2): qty 100

## 2020-07-20 MED ORDER — EPHEDRINE 5 MG/ML INJ
INTRAVENOUS | Status: AC
  Filled 2020-07-20: qty 10

## 2020-07-20 MED ORDER — KETOROLAC TROMETHAMINE 30 MG/ML IJ SOLN
INTRAMUSCULAR | Status: AC
  Filled 2020-07-20: qty 1

## 2020-07-20 SURGICAL SUPPLY — 65 items
ADH SKN CLS APL DERMABOND .7 (GAUZE/BANDAGES/DRESSINGS) ×1
ATTUNE MED ANAT PAT 35 KNEE (Knees) ×1 IMPLANT
ATTUNE MED ANAT PAT 35MM KNEE (Knees) ×1 IMPLANT
ATTUNE PSFEM LTSZ3 NARCEM KNEE (Femur) ×2 IMPLANT
ATTUNE PSRP INSR SZ3 6 KNEE (Insert) ×1 IMPLANT
ATTUNE PSRP INSR SZ3 6MM KNEE (Insert) ×1 IMPLANT
BAG SPEC THK2 15X12 ZIP CLS (MISCELLANEOUS)
BAG ZIPLOCK 12X15 (MISCELLANEOUS) IMPLANT
BASEPLATE TIBIAL ROTATING SZ 4 (Knees) ×2 IMPLANT
BLADE SAW SGTL 11.0X1.19X90.0M (BLADE) IMPLANT
BLADE SAW SGTL 13.0X1.19X90.0M (BLADE) ×3 IMPLANT
BLADE SURG SZ10 CARB STEEL (BLADE) ×6 IMPLANT
BNDG ELASTIC 6X5.8 VLCR STR LF (GAUZE/BANDAGES/DRESSINGS) ×3 IMPLANT
BOWL SMART MIX CTS (DISPOSABLE) ×3 IMPLANT
BSPLAT TIB 4 CMNT ROT PLAT STR (Knees) ×1 IMPLANT
CEMENT HV SMART SET (Cement) ×4 IMPLANT
COVER SURGICAL LIGHT HANDLE (MISCELLANEOUS) ×3 IMPLANT
COVER WAND RF STERILE (DRAPES) IMPLANT
CUFF TOURN SGL QUICK 34 (TOURNIQUET CUFF) ×3
CUFF TRNQT CYL 34X4.125X (TOURNIQUET CUFF) ×1 IMPLANT
DECANTER SPIKE VIAL GLASS SM (MISCELLANEOUS) ×6 IMPLANT
DERMABOND ADVANCED (GAUZE/BANDAGES/DRESSINGS) ×2
DERMABOND ADVANCED .7 DNX12 (GAUZE/BANDAGES/DRESSINGS) ×1 IMPLANT
DRAPE U-SHAPE 47X51 STRL (DRAPES) ×3 IMPLANT
DRESSING AQUACEL AG SP 3.5X10 (GAUZE/BANDAGES/DRESSINGS) ×1 IMPLANT
DRSG AQUACEL AG ADV 3.5X10 (GAUZE/BANDAGES/DRESSINGS) ×2 IMPLANT
DRSG AQUACEL AG SP 3.5X10 (GAUZE/BANDAGES/DRESSINGS) ×3
DURAPREP 26ML APPLICATOR (WOUND CARE) ×6 IMPLANT
ELECT REM PT RETURN 15FT ADLT (MISCELLANEOUS) ×3 IMPLANT
GLOVE BIO SURGEON STRL SZ 6 (GLOVE) ×3 IMPLANT
GLOVE BIOGEL PI IND STRL 6.5 (GLOVE) ×1 IMPLANT
GLOVE BIOGEL PI IND STRL 7.5 (GLOVE) ×1 IMPLANT
GLOVE BIOGEL PI IND STRL 8.5 (GLOVE) ×1 IMPLANT
GLOVE BIOGEL PI INDICATOR 6.5 (GLOVE) ×2
GLOVE BIOGEL PI INDICATOR 7.5 (GLOVE) ×2
GLOVE BIOGEL PI INDICATOR 8.5 (GLOVE) ×2
GLOVE ECLIPSE 8.0 STRL XLNG CF (GLOVE) ×3 IMPLANT
GLOVE ORTHO TXT STRL SZ7.5 (GLOVE) ×3 IMPLANT
GOWN STRL REUS W/ TWL LRG LVL3 (GOWN DISPOSABLE) ×1 IMPLANT
GOWN STRL REUS W/TWL 2XL LVL3 (GOWN DISPOSABLE) ×3 IMPLANT
GOWN STRL REUS W/TWL LRG LVL3 (GOWN DISPOSABLE) ×6 IMPLANT
HANDPIECE INTERPULSE COAX TIP (DISPOSABLE) ×3
HOLDER FOLEY CATH W/STRAP (MISCELLANEOUS) IMPLANT
KIT TURNOVER KIT A (KITS) IMPLANT
MANIFOLD NEPTUNE II (INSTRUMENTS) ×3 IMPLANT
NDL SAFETY ECLIPSE 18X1.5 (NEEDLE) IMPLANT
NEEDLE HYPO 18GX1.5 SHARP (NEEDLE)
NS IRRIG 1000ML POUR BTL (IV SOLUTION) ×3 IMPLANT
PACK TOTAL KNEE CUSTOM (KITS) ×3 IMPLANT
PENCIL SMOKE EVACUATOR (MISCELLANEOUS) IMPLANT
PIN DRILL FIX HALF THREAD (BIT) ×2 IMPLANT
PIN FIX SIGMA LCS THRD HI (PIN) ×2 IMPLANT
PROTECTOR NERVE ULNAR (MISCELLANEOUS) ×3 IMPLANT
SET HNDPC FAN SPRY TIP SCT (DISPOSABLE) ×1 IMPLANT
SET PAD KNEE POSITIONER (MISCELLANEOUS) ×3 IMPLANT
SUT MNCRL AB 4-0 PS2 18 (SUTURE) ×3 IMPLANT
SUT STRATAFIX PDS+ 0 24IN (SUTURE) ×3 IMPLANT
SUT VIC AB 1 CT1 36 (SUTURE) ×3 IMPLANT
SUT VIC AB 2-0 CT1 27 (SUTURE) ×9
SUT VIC AB 2-0 CT1 TAPERPNT 27 (SUTURE) ×3 IMPLANT
SYR 3ML LL SCALE MARK (SYRINGE) ×3 IMPLANT
TRAY FOLEY MTR SLVR 16FR STAT (SET/KITS/TRAYS/PACK) ×3 IMPLANT
WATER STERILE IRR 1000ML POUR (IV SOLUTION) ×6 IMPLANT
WRAP KNEE MAXI GEL POST OP (GAUZE/BANDAGES/DRESSINGS) ×3 IMPLANT
YANKAUER SUCT BULB TIP 10FT TU (MISCELLANEOUS) ×3 IMPLANT

## 2020-07-20 NOTE — Evaluation (Signed)
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  Pt will benefit from skilled PT to increase their independence and safety with mobility to allow discharge to the venue listed below.  Pt ambulated in hallway and tolerated mobility well POD #0.  Pt plans to return home with assist from a friend.      Follow Up Recommendations Follow surgeon's recommendation for DC plan and follow-up therapies    Equipment Recommendations  None recommended by PT    Recommendations for Other Services       Precautions / Restrictions Precautions Precautions: Knee;Fall Restrictions Weight Bearing Restrictions: No      Mobility  Bed Mobility Overal bed mobility: Needs Assistance Bed Mobility: Supine to Sit     Supine to sit: Min guard     General bed mobility comments: provided hand for pt to self assist trunk upright  Transfers Overall transfer level: Needs assistance Equipment used: Rolling walker (2 wheeled) Transfers: Sit to/from Stand Sit to Stand: Min guard         General transfer comment: verbal cues for UE and LE positioning  Ambulation/Gait Ambulation/Gait assistance: Min guard Gait Distance (Feet): 100 Feet Assistive device: Rolling walker (2 wheeled) Gait Pattern/deviations: Step-to pattern;Step-through pattern;Decreased stance time - left;Antalgic     General Gait Details: verbal cues for sequence, RW positioning, step length  Stairs            Wheelchair Mobility    Modified Rankin (Stroke Patients Only)       Balance                                             Pertinent Vitals/Pain Pain Assessment:  0-10 Pain Score: 4  Pain Location: left knee Pain Descriptors / Indicators: Aching;Sore Pain Intervention(s): Repositioned;Monitored during session;Ice applied    Home Living Family/patient expects to be discharged to:: Private residence Living Arrangements: Alone Available Help at Discharge: Friend(s) Type of Home: Mobile home Home Access: Stairs to enter   [employer] of Steps: 4 with 2 reachable rails and then 2 steps with no rails Home Layout: One level Home Equipment: Cane - single point;Shower seat;Walker - 2 wheels      Prior Function Level of Independence: Independent               Hand Dominance        Extremity/Trunk Assessment        Lower Extremity Assessment Lower Extremity Assessment: LLE deficits/detail LLE Deficits / Details: able to perform SLR with small lag, able to perform ankle pumps, observed approx 65* functional knee flexion at EOB       Communication   Communication: No difficulties  Cognition Arousal/Alertness: Awake/alert Behavior During Therapy: WFL for tasks assessed/performed Overall Cognitive Status: Within Functional Limits for tasks assessed                                        General Comments  Exercises     Assessment/Plan    PT Assessment Patient needs continued PT services  PT Problem List Decreased strength;Decreased mobility;Decreased balance;Decreased range of motion;Pain       PT Treatment Interventions Gait training;Balance training;DME instruction;Therapeutic exercise;Functional mobility training;Therapeutic activities;Patient/family education;Stair training    PT Goals (Current goals can be found in the Care Plan section)  Acute Rehab PT Goals PT Goal Formulation: With patient Time For Goal Achievement: 07/24/20 Potential to Achieve Goals: Good    Frequency Min 3X/week   Barriers to discharge        Co-evaluation               AM-PAC PT "6 Clicks" Mobility   Outcome Measure Help needed turning from your back to your side while in a flat bed without using bedrails?: A Little Help needed moving from lying on your back to sitting on the side of a flat bed without using bedrails?: A Little Help needed moving to and from a bed to a chair (including a wheelchair)?: A Little Help needed standing up from a chair using your arms (e.g., wheelchair or bedside chair)?: A Little Help needed to walk in hospital room?: A Little Help needed climbing 3-5 steps with a railing? : A Little 6 Click Score: 18    End of Session Equipment Utilized During Treatment: Gait belt Activity Tolerance: Patient tolerated treatment well Patient left: in chair;with call bell/phone within reach;with chair alarm set   PT Visit Diagnosis: Other abnormalities of gait and mobility (R26.89)    Time: 3154-0086 PT Time Calculation (min) (ACUTE ONLY): 19 min   Charges:   PT Evaluation $PT Eval Low Complexity: 1 Low        Kati PT, DPT Acute Rehabilitation Services Pager: [phone] Office: [phone]  York Ram E 07/20/2020, 4:01 PM

## 2020-07-20 NOTE — Anesthesia Procedure Notes (Signed)
Anesthesia Regional Block: Adductor canal block   Pre-Anesthetic Checklist: ,, timeout performed, Correct Patient, Correct Site, Correct Laterality, Correct Procedure, Correct Position, site marked, Risks and benefits discussed,  Surgical consent,  Pre-op evaluation,  At surgeon's request and post-op pain management  Laterality: Left  Prep: chloraprep       Needles:  Injection technique: Single-shot  Needle Type: Echogenic Needle     Needle Length: 9cm      Additional Needles:   Procedures:,,,, ultrasound used (permanent image in chart),,,,  Narrative:  Start time: 07/20/2020 6:56 AM End time: 07/20/2020 7:03 AM Injection made incrementally with aspirations every 5 mL.  Performed by: Personally  Anesthesiologist: Myrtie Soman, MD  Additional Notes: Patient tolerated the procedure well without complications

## 2020-07-20 NOTE — Transfer of Care (Signed)
Immediate Anesthesia Transfer of Care Note  Patient: [name]  Procedure(s) Performed: TOTAL KNEE ARTHROPLASTY (Left Knee)  Patient Location: PACU  Anesthesia Type:Spinal  Level of Consciousness: drowsy and patient cooperative  Airway & Oxygen Therapy: Patient Spontanous Breathing and Patient connected to face mask oxygen  Post-op Assessment: Report given to RN and Post -op Vital signs reviewed and stable  Post vital signs: Reviewed and stable  Last Vitals:  Vitals Value Taken Time  BP 85/41 07/20/20 0921  Temp    Pulse 51 07/20/20 0922  Resp 18 07/20/20 0922  SpO2 98 % 07/20/20 0922  Vitals shown include unvalidated device data.  Last Pain:  Vitals:   07/20/20 0644  TempSrc: Oral  PainSc:          Complications: No complications documented.

## 2020-07-20 NOTE — Anesthesia Procedure Notes (Signed)
Anesthesia Procedure Image    

## 2020-07-20 NOTE — Anesthesia Procedure Notes (Signed)
Spinal  Patient location during procedure: OR Start time: 07/20/2020 7:41 AM End time: 07/20/2020 7:45 AM Staffing Performed: resident/CRNA  Anesthesiologist: Myrtie Soman, MD Resident/CRNA: Montel Clock, CRNA Preanesthetic Checklist Completed: patient identified, IV checked, risks and benefits discussed, surgical consent, monitors and equipment checked, pre-op evaluation and timeout performed Spinal Block Patient position: sitting Prep: DuraPrep Patient monitoring: heart rate, cardiac monitor, continuous pulse ox and blood pressure Approach: midline Location: L3-4 Injection technique: single-shot Needle Needle type: Pencan  Needle gauge: 24 G Needle length: 10 cm Needle insertion depth: 7 cm Assessment Sensory level: T6

## 2020-07-20 NOTE — Care Plan (Signed)
Ortho Bundle Case Management Note  Patient Details  Name: [name] MRN: [id number] Date of Birth: 1956/03/22  L TKA on 07-20-20 DCP:  Home with friend.  Lives in a mobile home with 4 ste. DME:  No needs.  Has a RW and 3-in-1. PT:  [facility].  PT eval scheduled on 07-23-20.                   DME Arranged:  N/A DME Agency:  NA  HH Arranged:  NA HH Agency:  NA  Additional Comments: Please contact me with any questions of if this plan should need to change.  Marianne Sofia, RN,CCM [facility]  [phone] 07/20/2020, 2:38 PM

## 2020-07-20 NOTE — Anesthesia Postprocedure Evaluation (Signed)
Anesthesia Post Note  Patient: [name]  Procedure(s) Performed: TOTAL KNEE ARTHROPLASTY (Left Knee)     Patient location during evaluation: PACU Anesthesia Type: Spinal Level of consciousness: oriented and awake and alert Pain management: pain level controlled Vital Signs Assessment: post-procedure vital signs reviewed and stable Respiratory status: spontaneous breathing, respiratory function stable and patient connected to nasal cannula oxygen Cardiovascular status: blood pressure returned to baseline and stable Postop Assessment: no headache, no backache and no apparent nausea or vomiting Anesthetic complications: no   No complications documented.  Last Vitals:  Vitals:   07/20/20 1015 07/20/20 1030  BP: (!) 110/58 113/68  Pulse: (!) 53 (!) 54  Resp: 20 12  Temp:  36.7 C  SpO2: 95% 97%    Last Pain:  Vitals:   07/20/20 1030  TempSrc:   PainSc: 0-No pain                 Paiden,Abdullah Rizzi S

## 2020-07-20 NOTE — Discharge Instructions (Signed)

## 2020-07-20 NOTE — Op Note (Signed)
NAME:  [name]                      MEDICAL RECORD NO.:  [id number]                             FACILITY:  [facility]      PHYSICIAN:  Pietro Cassis. Alvan Dame, M.D.  DATE OF BIRTH:  1956-05-21      DATE OF PROCEDURE:  07/20/2020                                     OPERATIVE REPORT         PREOPERATIVE DIAGNOSIS:  Left knee osteoarthritis.      POSTOPERATIVE DIAGNOSIS:  Left knee osteoarthritis.      FINDINGS:  The patient was noted to have complete loss of cartilage and   bone-on-bone arthritis with associated osteophytes in the lateral and patellofemoral compartments of   the knee with a significant synovitis and associated effusion.  The patient had failed months of conservative treatment including medications, injection therapy, activity modification.     PROCEDURE:  Left total knee replacement.      COMPONENTS USED:  DePuy Attune rotating platform posterior stabilized knee   system, a size 3N femur, 4 tibia, size 6 mm PS AOX insert, and 35 anatomic patellar   button.      SURGEON:  Pietro Cassis. Alvan Dame, M.D.      ASSISTANT:  Griffith Citron, PA-C.      ANESTHESIA:  Regional and Spinal.      SPECIMENS:  None.      COMPLICATION:  None.      DRAINS:  None.  EBL: <100cc      TOURNIQUET TIME:   Total Tourniquet Time Documented: Thigh (Left) - 27 minutes Total: Thigh (Left) - 27 minutes       The patient was stable to the recovery room.      INDICATION FOR PROCEDURE:  IPEK WESTRA is a 64 y.o. female patient of   mine.  The patient had been seen, evaluated, and treated for months conservatively in the   office with medication, activity modification, and injections.  The patient had   radiographic changes of bone-on-bone arthritis with endplate sclerosis and osteophytes noted.  Based on the radiographic changes and failed conservative measures, the patient   decided to proceed with definitive treatment, total knee replacement.  Risks of infection, DVT, component failure, need for  revision surgery, neurovascular injury were reviewed in the office setting.  The postop course was reviewed stressing the efforts to maximize post-operative satisfaction and function.  Consent was obtained for benefit of pain   relief.      PROCEDURE IN DETAIL:  The patient was brought to the operative theater.   Once adequate anesthesia, preoperative antibiotics, 2 gm of Ancef,1 gm of Tranexamic Acid, and 10 mg of Decadron administered, the patient was positioned supine with a left thigh tourniquet placed.  The  left lower extremity was prepped and draped in sterile fashion.  A time-   out was performed identifying the patient, planned procedure, and the appropriate extremity.      The left lower extremity was placed in the [facility] leg holder.  The leg was   exsanguinated, tourniquet elevated to 250 mmHg.  A midline incision was   made followed  by median parapatellar arthrotomy.  Following initial   exposure, attention was first directed to the patella.  Precut   measurement was noted to be 21 mm.  I resected down to 13 mm and used a   35 anatomic patellar button to restore patellar height as well as cover the cut surface.      The lug holes were drilled and a metal shim was placed to protect the   patella from retractors and saw blade during the procedure.      At this point, attention was now directed to the femur.  The femoral   [place]ows was opened with a drill, irrigated to try to prevent fat emboli.  An   intramedullary rod was passed at 3 degrees valgus, 9 mm of bone was   resected off the distal femur.  Following this resection, the tibia was   subluxated anteriorly.  Using the extramedullary guide, 4 mm of bone was resected off   the proximal medial tibia.  We confirmed the gap would be   stable medially and laterally with a size 5 spacer block as well as confirmed that the tibial cut was perpendicular in the coronal plane, checking with an alignment rod.      Once this was done, I  sized the femur to be a size 3 in the anterior-   posterior dimension, chose a narrow component based on medial and   lateral dimension.  The size 3 rotation block was then pinned in   position anterior referenced using the C-clamp to set rotation.  The   anterior, posterior, and  chamfer cuts were made without difficulty nor   notching making certain that I was along the anterior cortex to help   with flexion gap stability.      The final box cut was made off the lateral aspect of distal femur.      At this point, the tibia was sized to be a size 4.  The size 4 tray was   then pinned in position through the medial third of the tubercle,   drilled, and keel punched.  Trial reduction was now carried with a 3 femur,  4 tibia, a size 6 mm PS insert, and the 35 anatomic patella botton.  The knee was brought to full extension with good flexion stability with the patella   tracking through the trochlea without application of pressure.  Given   all these findings the trial components removed.  Final components were   opened and cement was mixed.  The knee was irrigated with normal saline solution and pulse lavage.  The synovial lining was   then injected with 30 cc of 0.25% Marcaine with epinephrine, 1 cc of Toradol and 30 cc of NS for a total of 61 cc.     Final implants were then cemented onto cleaned and dried cut surfaces of bone with the knee brought to extension with a size 6 mm PS trial insert.      Once the cement had fully cured, excess cement was removed   throughout the knee.  I confirmed that I was satisfied with the range of   motion and stability, and the final size 6 mm PS AOX insert was chosen.  It was   placed into the knee.      The tourniquet had been let down at 27 minutes.  No significant   hemostasis was required.  The extensor mechanism was then reapproximated using #1 Vicryl and #1  Stratafix sutures with the knee   in flexion.  The   remaining wound was closed with 2-0  Vicryl and running 4-0 Monocryl.   The knee was cleaned, dried, dressed sterilely using Dermabond and   Aquacel dressing.  The patient was then   brought to recovery room in stable condition, tolerating the procedure   well.   Please note that Physician Assistant, Griffith Citron, PA-C was present for the entirety of the case, and was utilized for pre-operative positioning, peri-operative retractor management, general facilitation of the procedure and for primary wound closure at the end of the case.              Pietro Cassis Alvan Dame, M.D.    07/20/2020 8:59 AM

## 2020-07-20 NOTE — Interval H&P Note (Signed)
History and Physical Interval Note:  07/20/2020 7:30 AM  [name]  has presented today for surgery, with the diagnosis of Left knee osteoarthritis.  The various methods of treatment have been discussed with the patient and family. After consideration of risks, benefits and other options for treatment, the patient has consented to  Procedure(s): TOTAL KNEE ARTHROPLASTY (Left) as a surgical intervention.  The patient's history has been reviewed, patient examined, no change in status, stable for surgery.  I have reviewed the patient's chart and labs.  Questions were answered to the patient's satisfaction.     Mauri Pole

## 2020-07-21 ENCOUNTER — Encounter (HOSPITAL_COMMUNITY): Payer: Self-pay | Admitting: Orthopedic Surgery

## 2020-07-21 DIAGNOSIS — M1712 Unilateral primary osteoarthritis, left knee: Secondary | ICD-10-CM | POA: Diagnosis not present

## 2020-07-21 DIAGNOSIS — F17210 Nicotine dependence, cigarettes, uncomplicated: Secondary | ICD-10-CM | POA: Diagnosis not present

## 2020-07-21 DIAGNOSIS — J45909 Unspecified asthma, uncomplicated: Secondary | ICD-10-CM | POA: Diagnosis not present

## 2020-07-21 DIAGNOSIS — I119 Hypertensive heart disease without heart failure: Secondary | ICD-10-CM | POA: Diagnosis not present

## 2020-07-21 DIAGNOSIS — J441 Chronic obstructive pulmonary disease with (acute) exacerbation: Secondary | ICD-10-CM | POA: Diagnosis not present

## 2020-07-21 DIAGNOSIS — I25110 Atherosclerotic heart disease of native coronary artery with unstable angina pectoris: Secondary | ICD-10-CM | POA: Diagnosis not present

## 2020-07-21 LAB — CBC
HCT: 35.7 % — ABNORMAL LOW (ref 36.0–46.0)
Hemoglobin: 11.1 g/dL — ABNORMAL LOW (ref 12.0–15.0)
MCH: 29.2 pg (ref 26.0–34.0)
MCHC: 31.1 g/dL (ref 30.0–36.0)
MCV: 93.9 fL (ref 80.0–100.0)
Platelets: 247 10*3/uL (ref 150–400)
RBC: 3.8 MIL/uL — ABNORMAL LOW (ref 3.87–5.11)
RDW: 14.3 % (ref 11.5–15.5)
WBC: 33.3 10*3/uL — ABNORMAL HIGH (ref 4.0–10.5)
nRBC: 0 % (ref 0.0–0.2)

## 2020-07-21 LAB — BASIC METABOLIC PANEL
Anion gap: 10 (ref 5–15)
BUN: 15 mg/dL (ref 8–23)
CO2: 25 mmol/L (ref 22–32)
Calcium: 9 mg/dL (ref 8.9–10.3)
Chloride: 103 mmol/L (ref 98–111)
Creatinine, Ser: 0.88 mg/dL (ref 0.44–1.00)
GFR calc Af Amer: >60 mL/min (ref >60)
GFR calc non Af Amer: >60 mL/min (ref >60)
Glucose, Bld: 141 mg/dL — ABNORMAL HIGH (ref 70–99)
Potassium: 4.2 mmol/L (ref 3.5–5.1)
Sodium: 138 mmol/L (ref 135–145)

## 2020-07-21 MED ORDER — FERROUS SULFATE 325 (65 FE) MG PO TABS: 325.0000 mg | ORAL_TABLET | Freq: Two times a day (BID) | ORAL | 0 refills | Status: DC

## 2020-07-21 MED ORDER — METHOCARBAMOL 500 MG PO TABS: 500.0000 mg | ORAL_TABLET | Freq: Four times a day (QID) | ORAL | 0 refills | Status: DC | PRN

## 2020-07-21 MED ORDER — HYDROCODONE-ACETAMINOPHEN 7.5-325 MG PO TABS: 1.0000 | ORAL_TABLET | ORAL | 0 refills | Status: DC | PRN

## 2020-07-21 MED ORDER — ASPIRIN 81 MG PO CHEW: 81.0000 mg | CHEWABLE_TABLET | Freq: Two times a day (BID) | ORAL | 0 refills | Status: AC

## 2020-07-21 NOTE — Progress Notes (Signed)
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 Pt provided with HEP handout and had no further questions.  Pt feels ready for d/c home today.   Follow Up Recommendations  Follow surgeon's recommendation for DC plan and follow-up therapies     Equipment Recommendations  None recommended by PT    Recommendations for Other Services       Precautions / Restrictions Precautions Precautions: Knee;Fall Restrictions Weight Bearing Restrictions: No    Mobility  Bed Mobility Overal bed mobility: Needs Assistance Bed Mobility: Supine to Sit;Sit to Supine     Supine to sit: Supervision;HOB elevated Sit to supine: Supervision;HOB elevated      Transfers Overall transfer level: Needs assistance Equipment used: Rolling walker (2 wheeled) Transfers: Sit to/from Stand Sit to Stand: Supervision         General transfer comment: verbal cues for UE and LE positioning  Ambulation/Gait Ambulation/Gait assistance: Min guard;Supervision Gait Distance (Feet): 140 Feet Assistive device: Rolling walker (2 wheeled) Gait Pattern/deviations: Step-through pattern;Decreased stance time - left;Antalgic     General Gait Details: verbal cues for sequence, RW positioning, step length   Stairs  Wheelchair Mobility    Modified Rankin (Stroke Patients Only)       Balance                                            Cognition Arousal/Alertness: Awake/alert Behavior During Therapy: WFL for tasks assessed/performed Overall Cognitive Status: Within Functional Limits for tasks assessed                                        Exercises Total  Joint Exercises Ankle Circles/Pumps: AROM;Both;10 reps Quad Sets: AROM;Both;10 reps Short Arc Quad: AROM;Left;10 reps Heel Slides: AAROM;Left;10 reps Hip ABduction/ADduction: AROM;Left;10 reps Straight Leg Raises: AAROM;Left;10 reps    General Comments        Pertinent Vitals/Pain Pain Assessment: 0-10 Pain Score: 4  Pain Location: left knee Pain Descriptors / Indicators: Aching;Sore Pain Intervention(s): Repositioned;Monitored during session;Ice applied    Home Living                      Prior Function            PT Goals (current goals can now be found in the care plan section) Progress towards PT goals: Progressing toward goals    Frequency    7X/week      PT Plan Current plan remains appropriate    Co-evaluation              AM-PAC PT "6 Clicks" Mobility   Outcome Measure  Help needed turning from your back to your side while in a flat bed without using bedrails?: A Little Help needed moving from lying on your back to sitting on the side of a flat bed without using bedrails?: A Little Help needed moving to and from a bed to a chair (including a wheelchair)?: A Little Help needed  standing up from a chair using your arms (e.g., wheelchair or bedside chair)?: A Little Help needed to walk in hospital room?: A Little Help needed climbing 3-5 steps with a railing? : A Little 6 Click Score: 18    End of Session Equipment Utilized During Treatment: Gait belt Activity Tolerance: Patient tolerated treatment well Patient left: with call bell/phone within reach;in bed   PT Visit Diagnosis: Other abnormalities of gait and mobility (R26.89)     Time: 5009-3818 PT Time Calculation (min) (ACUTE ONLY): 24 min  Charges:  $Gait Training: 8-22 mins $Therapeutic Exercise: 8-22 mins                    Jannette Spanner PT, DPT Acute Rehabilitation Services Pager: [phone] Office: [phone]  York Ram E 07/21/2020, 12:29 PM

## 2020-07-21 NOTE — Progress Notes (Signed)
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  Pt's breakfast arrived upon return to room so will return to review exercises prior to d/c home today.  Follow Up Recommendations  Follow surgeon's recommendation for DC plan and follow-up therapies     Equipment Recommendations  None recommended by PT    Recommendations for Other Services       Precautions / Restrictions Precautions Precautions: Knee;Fall Restrictions Weight Bearing Restrictions: No    Mobility  Bed Mobility Overal bed mobility: Needs Assistance Bed Mobility: Supine to Sit;Sit to Supine     Supine to sit: Min guard;HOB elevated Sit to supine: Min guard;HOB elevated      Transfers Overall transfer level: Needs assistance Equipment used: Rolling walker (2 wheeled) Transfers: Sit to/from Stand Sit to Stand: Min guard         General transfer comment: verbal cues for UE and LE positioning  Ambulation/Gait Ambulation/Gait assistance: Min guard Gait Distance (Feet): 160 Feet Assistive device: Rolling walker (2 wheeled) Gait Pattern/deviations: Step-through pattern;Decreased stance time - left;Antalgic     General Gait Details: verbal cues for sequence, RW positioning, step length   Stairs Stairs: Yes Stairs assistance: Min guard Stair Management: Step to pattern;Forwards;Two rails Number of Stairs: 3 General stair comments: verbal cues for sequence and safety; pt also performed 2 steps with only HHA/forearm support   Wheelchair Mobility    Modified Rankin (Stroke Patients Only)       Balance                                            Cognition  Arousal/Alertness: Awake/alert Behavior During Therapy: WFL for tasks assessed/performed Overall Cognitive Status: Within Functional Limits for tasks assessed                                        Exercises      General Comments        Pertinent Vitals/Pain Pain Assessment: 0-10 Pain Score: 3  Pain Location: left knee Pain Descriptors / Indicators: Aching;Sore Pain Intervention(s): Monitored during session;Repositioned;Premedicated before session    Home Living                      Prior Function            PT Goals (current goals can now be found in the care plan section) Progress towards PT goals: Progressing toward goals    Frequency    7X/week      PT Plan Current plan remains appropriate    Co-evaluation              AM-PAC PT "6 Clicks" Mobility   Outcome Measure  Help needed turning from your back to your side while in a flat bed without using bedrails?: A Little Help needed moving from lying on your back to sitting on the side of a flat bed without using bedrails?: A Little Help needed moving to and from  a bed to a chair (including a wheelchair)?: A Little Help needed standing up from a chair using your arms (e.g., wheelchair or bedside chair)?: A Little Help needed to walk in hospital room?: A Little Help needed climbing 3-5 steps with a railing? : A Little 6 Click Score: 18    End of Session Equipment Utilized During Treatment: Gait belt Activity Tolerance: Patient tolerated treatment well Patient left: with call bell/phone within reach;in bed   PT Visit Diagnosis: Other abnormalities of gait and mobility (R26.89)     Time: 4935-5217 PT Time Calculation (min) (ACUTE ONLY): 17 min  Charges:  $Gait Training: 8-22 mins                    Arlyce Dice, DPT Acute Rehabilitation Services Pager: [phone] Office: [phone]  Camani Sesay,KATHrine E 07/21/2020, 9:40 AM

## 2020-07-21 NOTE — Progress Notes (Signed)
Subjective: 1 Day Post-Op Procedure(s) (LRB): TOTAL KNEE ARTHROPLASTY (Left) Patient reports pain as mild.   Patient seen in rounds with Dr. Alvan Dame. Patient is well, and has had no acute complaints or problems other than discomfort in the left knee. No acute events overnight. Ambulated 100 feet with PT yesterday.  We will continue therapy today.   Objective: Vital signs in last 24 hours: Temp:  [97.6 F (36.4 C)-98.3 F (36.8 C)] 97.8 F (36.6 C) (08/04 0517) Pulse Rate:  [51-87] 60 (08/04 0517) Resp:  [12-20] 14 (08/04 0517) BP: (83-161)/(41-87) 158/67 (08/04 0517) SpO2:  [94 %-100 %] 99 % (08/04 0517) Weight:  [74 kg] 74 kg (08/03 1045)  Intake/Output from previous day:  Intake/Output Summary (Last 24 hours) at 07/21/2020 0808 Last data filed at 07/21/2020 0715 Gross per 24 hour  Intake 4144.73 ml  Output 3875 ml  Net 269.73 ml     Intake/Output this shift: Total I/O In: -  Out: 500 [Urine:500]  Labs: Recent Labs    07/21/20 0314  HGB 11.1*   Recent Labs    07/21/20 0314  WBC 33.3*  RBC 3.80*  HCT 35.7*  PLT 247   Recent Labs    07/21/20 0314  NA 138  K 4.2  CL 103  CO2 25  BUN 15  CREATININE 0.88  GLUCOSE 141*  CALCIUM 9.0   No results for input(s): LABPT, INR in the last 72 hours.  Exam: General - Patient is Alert and Oriented Extremity - Neurologically intact Sensation intact distally Intact pulses distally Dorsiflexion/Plantar flexion intact Dressing - dressing C/D/I Motor Function - intact, moving foot and toes well on exam.   Past Medical History:  Diagnosis Date  . Agoraphobia without history of panic disorder   . Anginal pain ([place])   . Anxiety   . Arthritis    "qwhere" (04/03/2018)  . Childhood asthma   . Chronic bronchitis ([place])   . Chronic lower back pain   . COPD (chronic obstructive pulmonary disease) ([place])   . Coronary artery disease CARDIOLOGIST- DR  Doylene Canard  (VISIT 03-30-11 W/ CHART)   NON-OBS. CAD   (STRESS TEST NOV.  2011  . DDD (degenerative disc disease)   . Depression   . DJD (degenerative joint disease)    JOINT PAIN  . Dyspnea    "anytime but mostly on exertion and smoking"  . Dysrhythmia 2017   SVT  . Elevated liver enzymes   . Fibromyalgia   . Frequency of urination   . GERD (gastroesophageal reflux disease)     W/ NEXIUM  . Heart murmur   . Hemorrhoids   . High cholesterol   . History of blood transfusion    "related to anemia" (04/03/2018)  . History of carpal tunnel syndrome    Right  . History of gastric ulcer 2004  . Hypertension   . IBS (irritable bowel syndrome)   . Incisional pain s/p interstim implant 1st stage--- 12-07-11    left upper buttock-- pt states dressing clean dry and intact (on 12-08-11)  . Insomnia   . Iron deficiency anemia 2004  . Leukocytosis   . Nocturia   . Non-productive cough   . Numbness in both hands AT TIMES  . OSA (obstructive sleep apnea)    "suppose to wear mask; I don't" (04/03/2018)  . PONV (postoperative nausea and vomiting)   . SVT (supraventricular tachycardia) ([place])   . Urge urinary incontinence     Assessment/Plan: 1 Day Post-Op Procedure(s) (LRB):  TOTAL KNEE ARTHROPLASTY (Left) Active Problems:   s/p left TKA  Estimated body mass index is 32.97 kg/m as calculated from the following:   Height as of this encounter: 4\' 11"  (1.499 m).   Weight as of this encounter: 74 kg. Advance diet Up with therapy   Patient's anticipated LOS is less than 2 midnights, meeting these requirements: - Younger than 59 - Lives within 1 hour of care - Has a competent adult at home to recover with post-op recover - NO history of  - Chronic pain requiring opiods  - Diabetes  - Coronary Artery Disease  - Heart failure  - Heart attack  - Stroke  - DVT/VTE  - Cardiac arrhythmia  - Respiratory Failure/COPD  - Renal failure  - Anemia  - Advanced Liver disease  DVT Prophylaxis - Aspirin Weight bearing as tolerated.  Plan is to go Home after  hospital stay. Plan for discharge today following 1-2 sessions of therapy as long as she continues to meet her goals. She will follow up in 2 weeks.   Griffith Citron, PA-C Orthopedic Surgery [phone] 07/21/2020, 8:08 AM

## 2020-07-21 NOTE — Plan of Care (Signed)
resolved 

## 2020-07-21 NOTE — Plan of Care (Signed)
  Problem: Education: Goal: Knowledge of General Education information will improve Description Including pain rating scale, medication(s)/side effects and non-pharmacologic comfort measures Outcome: Progressing   

## 2020-07-27 NOTE — Discharge Summary (Signed)
Physician Discharge Summary   Patient ID: [name] MRN: [id number] DOB/AGE: November 27, 1956 64 y.o.  Admit date: 07/20/2020 Discharge date: 07/21/2020  Primary Diagnosis: Left knee osteoarthritis.   Admission Diagnoses:  Past Medical History:  Diagnosis Date  . Agoraphobia without history of panic disorder   . Anginal pain ([place])   . Anxiety   . Arthritis    "qwhere" (04/03/2018)  . Childhood asthma   . Chronic bronchitis ([place])   . Chronic lower back pain   . COPD (chronic obstructive pulmonary disease) ([place])   . Coronary artery disease CARDIOLOGIST- DR  Doylene Canard  (VISIT 03-30-11 W/ CHART)   NON-OBS. CAD   (STRESS TEST NOV. 2011  . DDD (degenerative disc disease)   . Depression   . DJD (degenerative joint disease)    JOINT PAIN  . Dyspnea    "anytime but mostly on exertion and smoking"  . Dysrhythmia 2017   SVT  . Elevated liver enzymes   . Fibromyalgia   . Frequency of urination   . GERD (gastroesophageal reflux disease)     W/ NEXIUM  . Heart murmur   . Hemorrhoids   . High cholesterol   . History of blood transfusion    "related to anemia" (04/03/2018)  . History of carpal tunnel syndrome    Right  . History of gastric ulcer 2004  . Hypertension   . IBS (irritable bowel syndrome)   . Incisional pain s/p interstim implant 1st stage--- 12-07-11    left upper buttock-- pt states dressing clean dry and intact (on 12-08-11)  . Insomnia   . Iron deficiency anemia 2004  . Leukocytosis   . Nocturia   . Non-productive cough   . Numbness in both hands AT TIMES  . OSA (obstructive sleep apnea)    "suppose to wear mask; I don't" (04/03/2018)  . PONV (postoperative nausea and vomiting)   . SVT (supraventricular tachycardia) ([place])   . Urge urinary incontinence    Discharge Diagnoses:   Active Problems:   s/p left TKA  Estimated body mass index is 32.97 kg/m as calculated from the following:   Height as of this encounter: 4\' 11"  (1.499 m).   Weight as of this encounter:  74 kg.  Procedure:  Procedure(s) (LRB): TOTAL KNEE ARTHROPLASTY (Left)   Consults: None  HPI:  [name] is a 64 y.o. female patient of   mine.  The patient had been seen, evaluated, and treated for months conservatively in the   office with medication, activity modification, and injections.  The patient had   radiographic changes of bone-on-bone arthritis with endplate sclerosis and osteophytes noted.  Based on the radiographic changes and failed conservative measures, the patient   decided to proceed with definitive treatment, total knee replacement.  Risks of infection, DVT, component failure, need for revision surgery, neurovascular injury were reviewed in the office setting.  The postop course was reviewed stressing the efforts to maximize post-operative satisfaction and function.  Consent was obtained for benefit of pain   relief.       Laboratory Data: Admission on 07/20/2020, Discharged on 07/21/2020  Component Date Value Ref Range Status  . WBC 07/21/2020 33.3* 4.0 - 10.5 K/uL Final  . RBC 07/21/2020 3.80* 3.87 - 5.11 MIL/uL Final  . Hemoglobin 07/21/2020 11.1* 12.0 - 15.0 g/dL Final  . HCT 07/21/2020 35.7* 36 - 46 % Final  . MCV 07/21/2020 93.9  80.0 - 100.0 fL Final  . MCH 07/21/2020 29.2  26.0 -  34.0 pg Final  . MCHC 07/21/2020 31.1  30.0 - 36.0 g/dL Final  . RDW 07/21/2020 14.3  11.5 - 15.5 % Final  . Platelets 07/21/2020 247  150 - 400 K/uL Final  . nRBC 07/21/2020 0.0  0.0 - 0.2 % Final   Performed at [facility], [place] [address], [place], [place] [address]  . Sodium 07/21/2020 138  135 - 145 mmol/L Final  . Potassium 07/21/2020 4.2  3.5 - 5.1 mmol/L Final  . Chloride 07/21/2020 103  98 - 111 mmol/L Final  . CO2 07/21/2020 25  22 - 32 mmol/L Final  . Glucose, Bld 07/21/2020 141* 70 - 99 mg/dL Final   Glucose reference range applies only to samples taken after fasting for at least 8 hours.  . BUN 07/21/2020 15  8 - 23 mg/dL Final  .  Creatinine, Ser 07/21/2020 0.88  0.44 - 1.00 mg/dL Final  . Calcium 07/21/2020 9.0  8.9 - 10.3 mg/dL Final  . GFR calc non Af Amer 07/21/2020 >60  >60 mL/min Final  . GFR calc Af Amer 07/21/2020 >60  >60 mL/min Final  . Anion gap 07/21/2020 10  5 - 15 Final   Performed at [facility], [place] [address], [place], [place] [address]  Hospital Outpatient Visit on 07/16/2020  Component Date Value Ref Range Status  . SARS Coronavirus 2 07/16/2020 NEGATIVE  NEGATIVE Final   Comment: (NOTE) SARS-CoV-2 target nucleic acids are NOT DETECTED.  The SARS-CoV-2 RNA is generally detectable in upper and lower respiratory specimens during the acute phase of infection. Negative results do not preclude SARS-CoV-2 infection, do not rule out co-infections with other pathogens, and should not be used as the sole basis for treatment or other patient management decisions. Negative results must be combined with clinical observations, patient history, and epidemiological information. The expected result is Negative.  Fact Sheet for Patients: SugarRoll.be  Fact Sheet for Healthcare Providers: https://www.woods-mathews.com/  This test is not yet approved or cleared by the [place] FDA and  has been authorized for detection and/or diagnosis of SARS-CoV-2 by FDA under an Emergency Use Authorization (EUA). This EUA will remain  in effect (meaning this test can be used) for the duration of the COVID-19 declaration under Se                          ction 564(b)(1) of the Act, 21 U.S.C. section 360bbb-3(b)(1), unless the authorization is terminated or revoked sooner.  Performed at [facility], [place] [address] [place], [place] [address]   Hospital Outpatient Visit on 07/12/2020  Component Date Value Ref Range Status  . ABO/RH(D) 07/12/2020 A NEG   Final  . Antibody Screen 07/12/2020 NEG   Final  . Sample Expiration 07/12/2020  07/23/2020,2359   Final  . Extend sample reason 07/12/2020    Final                   Value:NO TRANSFUSIONS OR PREGNANCY IN THE PAST 3 MONTHS Performed at [facility] [address], [place], [place] [address]   . MRSA, PCR 07/12/2020 NEGATIVE  NEGATIVE Final  . Staphylococcus aureus 07/12/2020 POSITIVE* NEGATIVE Final   Comment: (NOTE) The Xpert SA Assay (FDA approved for NASAL specimens in patients 59 years of age and older), is one component of a comprehensive surveillance program. It is not intended to diagnose infection nor to guide or monitor treatment. Performed at  [facility], [place] [address], [place], [place] [address]   . WBC 07/12/2020 29.2* 4.0 - 10.5 K/uL Final  . RBC 07/12/2020 5.04  3.87 - 5.11 MIL/uL Final  . Hemoglobin 07/12/2020 14.7  12.0 - 15.0 g/dL Final  . HCT 07/12/2020 47.2* 36 - 46 % Final  . MCV 07/12/2020 93.7  80.0 - 100.0 fL Final  . MCH 07/12/2020 29.2  26.0 - 34.0 pg Final  . MCHC 07/12/2020 31.1  30.0 - 36.0 g/dL Final  . RDW 07/12/2020 14.5  11.5 - 15.5 % Final  . Platelets 07/12/2020 436* 150 - 400 K/uL Final  . nRBC 07/12/2020 0.0  0.0 - 0.2 % Final   Performed at [facility], [place] [address], [place], [place] [address]  . Sodium 07/12/2020 138  135 - 145 mmol/L Final  . Potassium 07/12/2020 4.1  3.5 - 5.1 mmol/L Final  . Chloride 07/12/2020 100  98 - 111 mmol/L Final  . CO2 07/12/2020 27  22 - 32 mmol/L Final  . Glucose, Bld 07/12/2020 123* 70 - 99 mg/dL Final   Glucose reference range applies only to samples taken after fasting for at least 8 hours.  . BUN 07/12/2020 30* 8 - 23 mg/dL Final  . Creatinine, Ser 07/12/2020 0.89  0.44 - 1.00 mg/dL Final  . Calcium 07/12/2020 9.9  8.9 - 10.3 mg/dL Final  . GFR calc non Af Amer 07/12/2020 >60  >60 mL/min Final  . GFR calc Af Amer 07/12/2020 >60  >60 mL/min Final  . Anion gap 07/12/2020 11  5 - 15 Final   Performed at [facility], [place] [address] [place], [place] [address]     X-Rays:No results found.  EKG: Orders placed or performed during the hospital encounter of 02/16/20  . ED EKG  . ED EKG  . EKG 12-Lead  . EKG 12-Lead  . EKG     Hospital Course: [name] is a 64 y.o. who was admitted to [facility]. They were brought to the operating room on 07/20/2020 and underwent Procedure(s): TOTAL KNEE ARTHROPLASTY.  Patient tolerated the procedure well and was later transferred to the recovery room and then to the orthopaedic floor for postoperative care. They were given PO and IV analgesics for pain control following their surgery. They were given 24 hours of postoperative antibiotics of  Anti-infectives (From admission, onward)   Start     Dose/Rate Route Frequency Ordered Stop   07/20/20 1400  ceFAZolin (ANCEF) IVPB 2g/100 mL premix        2 g 200 mL/hr over 30 Minutes Intravenous Every 6 hours 07/20/20 1057 07/20/20 2351   07/20/20 0600  ceFAZolin (ANCEF) IVPB 2g/100 mL premix        2 g 200 mL/hr over 30 Minutes Intravenous On call to O.R. 07/20/20 9323 07/20/20 5573     and started on DVT prophylaxis in the form of Aspirin.   PT and OT were ordered for total joint protocol. Discharge planning consulted to help with postop disposition and equipment needs.  Patient had a good night on the evening of surgery. They started to get up OOB with therapy on POD #0. Pt was seen during rounds and was ready to go home pending progress with therapy.  She worked with therapy on POD #1 and was meeting her goals. Pt was discharged to home later that day in stable condition.  Diet: Regular diet Activity: WBAT Follow-up: in 2 weeks  Disposition: Home Discharged Condition: good   Discharge Instructions    Call MD / Call 911   Complete by: As directed    If you experience chest pain or shortness of breath, CALL 911 and be transported to the hospital emergency room.  If you develope a fever above 101 F,  pus (white drainage) or increased drainage or redness at the wound, or calf pain, call your surgeon's office.   Change dressing   Complete by: As directed    Maintain surgical dressing until follow up in the clinic. If the edges start to pull up, may reinforce with tape. If the dressing is no longer working, may remove and cover with gauze and tape, but must keep the area dry and clean.  Call with any questions or concerns.   Constipation Prevention   Complete by: As directed    Drink plenty of fluids.  Prune juice may be helpful.  You may use a stool softener, such as Colace (over the counter) 100 mg twice a day.  Use MiraLax (over the counter) for constipation as needed.   Diet - low sodium heart healthy   Complete by: As directed    Discharge instructions   Complete by: As directed    Maintain surgical dressing until follow up in the clinic. If the edges start to pull up, may reinforce with tape. If the dressing is no longer working, may remove and cover with gauze and tape, but must keep the area dry and clean.  Follow up in 2 weeks at [facility]. Call with any questions or concerns.   Increase activity slowly as tolerated   Complete by: As directed    Weight bearing as tolerated with assist device (walker, cane, etc) as directed, use it as long as suggested by your surgeon or therapist, typically at least 4-6 weeks.   TED hose   Complete by: As directed    Use stockings (TED hose) for 2 weeks on both leg(s).  You may remove them at night for sleeping.     Allergies as of 07/21/2020      Reactions   Midazolam Hcl Other (See Comments)   HYPER   Prednisone Other (See Comments)   HYPER, depressed, moody   Statins Other (See Comments)   Causes muscle weakness and joint pain   Ace Inhibitors Other (See Comments)   DECREASES HEARTRATE   Amoxicillin Diarrhea   Did it involve swelling of the face/tongue/throat, SOB, or low BP? No Did it involve sudden or severe rash/hives, skin peeling, or  any reaction on the inside of your mouth or nose? No Did you need to seek medical attention at a hospital or doctor's office? No When did it last happen?~2019 or so If all above answers are "NO", may proceed with cephalosporin use.   Nsaids Other (See Comments)   AVOIDS; HX GASTRIC ULCER   Sulfa Antibiotics Rash, Other (See Comments)   JOINT PAIN      Medication List    STOP taking these medications   acetaminophen 500 MG tablet Commonly known as: TYLENOL   tiZANidine 4 MG tablet Commonly known as: ZANAFLEX     TAKE these medications   albuterol 108 (90 Base) MCG/ACT inhaler Commonly known as: VENTOLIN HFA Inhale 2 puffs into the lungs every 6 (six) hours as needed for wheezing or shortness of breath.   aspirin 81 MG chewable tablet Chew 1 tablet (81 mg total) by mouth 2 (two) times daily for 28 days. Then resume  normal dose of aspirin 81 mg daily. What changed:   when to take this  additional instructions   clonazePAM 1 MG tablet Commonly known as: KLONOPIN Take 1 tablet (1 mg total) by mouth at bedtime.   diltiazem 120 MG 24 hr capsule Commonly known as: CARDIZEM CD TAKE 1 CAPSULE (120 MG TOTAL) BY MOUTH DAILY. PLEASE SCHEDULE APPT FOR FUTURE REFILLS. 1ST ATTEMPT   DULoxetine 60 MG capsule Commonly known as: CYMBALTA Take 60 mg by mouth at bedtime.   ferrous sulfate 325 (65 FE) MG tablet Take 1 tablet (325 mg total) by mouth 2 (two) times daily with a meal for 14 days.   hydrALAZINE 25 MG tablet Commonly known as: APRESOLINE Take 25 mg by mouth daily.   HYDROcodone-acetaminophen 7.5-325 MG tablet Commonly known as: NORCO Take 1-2 tablets by mouth every 4 (four) hours as needed for severe pain (pain score 7-10).   methocarbamol 500 MG tablet Commonly known as: ROBAXIN Take 1 tablet (500 mg total) by mouth every 6 (six) hours as needed for muscle spasms.   metoprolol tartrate 25 MG tablet Commonly known as: LOPRESSOR Take 0.5 tablets (12.5 mg total) by  mouth 2 (two) times daily.   nitroGLYCERIN 0.4 MG SL tablet Commonly known as: NITROSTAT Place 1 tablet (0.4 mg total) under the tongue every 5 (five) minutes x 3 doses as needed for chest pain.   pantoprazole 40 MG tablet Commonly known as: PROTONIX Take 1 tablet (40 mg total) by mouth 2 (two) times daily.   polyethylene glycol 17 g packet Commonly known as: MIRALAX / GLYCOLAX Take 17 g by mouth 2 (two) times daily.   Vitamin D (Ergocalciferol) 1.25 MG (50000 UNIT) Caps capsule Commonly known as: DRISDOL Take 50,000 Units by mouth every Wednesday.            Discharge Care Instructions  (From admission, onward)         Start     Ordered   07/21/20 0000  Change dressing       Comments: Maintain surgical dressing until follow up in the clinic. If the edges start to pull up, may reinforce with tape. If the dressing is no longer working, may remove and cover with gauze and tape, but must keep the area dry and clean.  Call with any questions or concerns.   07/21/20 1040          Follow-up Information    [facility] Go on 07/23/2020.   Why: You are scheduled for a physical therapy appointment on 07-23-20 at 10:30 am.  Contact information: [place] [address] [phone]        Danae Orleans, PA-C. Go on 08/05/2020.   Specialty: Orthopedic Surgery Why: You are scheduled for a post-operative appointment on 08-05-20 at 10:45 am.  Contact information: [address] [place] [address] [phone]               Signed: Griffith Citron, PA-C Orthopedic Surgery 07/27/2020, 9:49 AM

## 2020-08-12 DIAGNOSIS — M25562 Pain in left knee: Secondary | ICD-10-CM | POA: Diagnosis not present

## 2020-08-17 DIAGNOSIS — M25562 Pain in left knee: Secondary | ICD-10-CM | POA: Insufficient documentation

## 2020-08-20 DIAGNOSIS — M25562 Pain in left knee: Secondary | ICD-10-CM | POA: Diagnosis not present

## 2020-08-25 DIAGNOSIS — M25562 Pain in left knee: Secondary | ICD-10-CM | POA: Diagnosis not present

## 2020-08-27 DIAGNOSIS — M25562 Pain in left knee: Secondary | ICD-10-CM | POA: Diagnosis not present

## 2020-08-31 DIAGNOSIS — Z471 Aftercare following joint replacement surgery: Secondary | ICD-10-CM | POA: Diagnosis not present

## 2020-08-31 DIAGNOSIS — Z96652 Presence of left artificial knee joint: Secondary | ICD-10-CM | POA: Diagnosis not present

## 2020-09-01 DIAGNOSIS — R0602 Shortness of breath: Secondary | ICD-10-CM | POA: Diagnosis not present

## 2020-09-01 DIAGNOSIS — I10 Essential (primary) hypertension: Secondary | ICD-10-CM | POA: Diagnosis not present

## 2020-09-01 DIAGNOSIS — R072 Precordial pain: Secondary | ICD-10-CM | POA: Diagnosis not present

## 2020-09-01 DIAGNOSIS — M25562 Pain in left knee: Secondary | ICD-10-CM | POA: Diagnosis not present

## 2020-09-02 DIAGNOSIS — M25562 Pain in left knee: Secondary | ICD-10-CM | POA: Diagnosis not present

## 2020-09-07 DIAGNOSIS — M25562 Pain in left knee: Secondary | ICD-10-CM | POA: Diagnosis not present

## 2020-09-09 DIAGNOSIS — M25562 Pain in left knee: Secondary | ICD-10-CM | POA: Diagnosis not present

## 2020-09-10 DIAGNOSIS — G5602 Carpal tunnel syndrome, left upper limb: Secondary | ICD-10-CM | POA: Diagnosis not present

## 2020-09-13 DIAGNOSIS — M25562 Pain in left knee: Secondary | ICD-10-CM | POA: Diagnosis not present

## 2020-09-17 DIAGNOSIS — M25562 Pain in left knee: Secondary | ICD-10-CM | POA: Diagnosis not present

## 2020-09-17 DIAGNOSIS — Z23 Encounter for immunization: Secondary | ICD-10-CM | POA: Diagnosis not present

## 2020-09-20 DIAGNOSIS — G5602 Carpal tunnel syndrome, left upper limb: Secondary | ICD-10-CM | POA: Diagnosis not present

## 2020-09-22 DIAGNOSIS — M25562 Pain in left knee: Secondary | ICD-10-CM | POA: Diagnosis not present

## 2020-09-28 DIAGNOSIS — M25562 Pain in left knee: Secondary | ICD-10-CM | POA: Diagnosis not present

## 2020-09-30 DIAGNOSIS — G5602 Carpal tunnel syndrome, left upper limb: Secondary | ICD-10-CM | POA: Diagnosis not present

## 2020-10-06 DIAGNOSIS — M25562 Pain in left knee: Secondary | ICD-10-CM | POA: Diagnosis not present

## 2020-10-12 DIAGNOSIS — M545 Low back pain, unspecified: Secondary | ICD-10-CM | POA: Diagnosis not present

## 2020-10-13 ENCOUNTER — Telehealth: Payer: Self-pay | Admitting: Nurse Practitioner

## 2020-10-13 ENCOUNTER — Other Ambulatory Visit: Payer: Self-pay | Admitting: Orthopedic Surgery

## 2020-10-13 DIAGNOSIS — G8929 Other chronic pain: Secondary | ICD-10-CM

## 2020-10-13 NOTE — Telephone Encounter (Signed)
Phone call to patient to verify medication list and allergies for myelogram procedure. Pt instructed to hold cymbalta for 48hrs prior to myelogram appointment time. Pt verbalized understanding. Pre and post procedure instructions reviewed with pt.

## 2020-10-15 DIAGNOSIS — G5602 Carpal tunnel syndrome, left upper limb: Secondary | ICD-10-CM | POA: Diagnosis not present

## 2020-10-18 ENCOUNTER — Ambulatory Visit
Admission: RE | Admit: 2020-10-18 | Discharge: 2020-10-18 | Disposition: A | Discharge status: Home / Self Care | Payer: Medicare Other | Source: Ambulatory Visit | Attending: Orthopedic Surgery | Admitting: Orthopedic Surgery

## 2020-10-18 ENCOUNTER — Other Ambulatory Visit: Payer: Self-pay

## 2020-10-18 DIAGNOSIS — M545 Low back pain, unspecified: Secondary | ICD-10-CM

## 2020-10-18 DIAGNOSIS — M4326 Fusion of spine, lumbar region: Secondary | ICD-10-CM | POA: Diagnosis not present

## 2020-10-18 DIAGNOSIS — M48061 Spinal stenosis, lumbar region without neurogenic claudication: Secondary | ICD-10-CM | POA: Diagnosis not present

## 2020-10-18 DIAGNOSIS — M4316 Spondylolisthesis, lumbar region: Secondary | ICD-10-CM | POA: Diagnosis not present

## 2020-10-18 MED ORDER — DIAZEPAM 5 MG PO TABS
5.0000 mg | ORAL_TABLET | Freq: Once | ORAL | Status: AC
  Administered 2020-10-18: 5 mg via ORAL

## 2020-10-18 MED ORDER — MEPERIDINE HCL 50 MG/ML IJ SOLN
50.0000 mg | Freq: Once | INTRAMUSCULAR | Status: AC
  Administered 2020-10-18: 50 mg via INTRAMUSCULAR

## 2020-10-18 MED ORDER — ONDANSETRON HCL 4 MG/2ML IJ SOLN
4.0000 mg | Freq: Once | INTRAMUSCULAR | Status: AC
  Administered 2020-10-18: 4 mg via INTRAMUSCULAR

## 2020-10-18 MED ORDER — IOPAMIDOL (ISOVUE-M 200) INJECTION 41%
15.0000 mL | Freq: Once | INTRAMUSCULAR | Status: AC
  Administered 2020-10-18: 15 mL via INTRA_ARTICULAR

## 2020-10-18 NOTE — Discharge Instructions (Signed)
Myelogram Discharge Instructions  1. Go home and rest quietly for the next 24 hours.  It is important to lie flat for the next 24 hours.  Get up only to go to the restroom.  You may lie in the bed or on a couch on your back, your stomach, your left side or your right side.  You may have one pillow under your head.  You may have pillows between your knees while you are on your side or under your knees while you are on your back.  2. DO NOT drive today.  Recline the seat as far back as it will go, while still wearing your seat belt, on the way home.  3. You may get up to go to the bathroom as needed.  You may sit up for 10 minutes to eat.  You may resume your normal diet and medications unless otherwise indicated.  Drink lots of extra fluids today and tomorrow.  4. The incidence of headache, nausea, or vomiting is about 5% (one in 20 patients).  If you develop a headache, lie flat and drink plenty of fluids until the headache goes away.  Caffeinated beverages may be helpful.  If you develop severe nausea and vomiting or a headache that does not go away with flat bed rest, call [phone].  5. You may resume normal activities after your 24 hours of bed rest is over; however, do not exert yourself strongly or do any heavy lifting tomorrow. If when you get up you have a headache when standing, go back to bed and force fluids for another 24 hours.  6. Call your physician for a follow-up appointment.  The results of your myelogram will be sent directly to your physician by the following day.  7. If you have any questions or if complications develop after you arrive home, please call [phone].  Discharge instructions have been explained to the patient.  The patient, or the person responsible for the patient, fully understands these instructions  YOU MAY [place] 10/19/20 AT 1PM

## 2020-10-18 NOTE — Progress Notes (Signed)
Pt reports she has been off of her Cymbalta for at least 48 hours.

## 2020-10-25 DIAGNOSIS — Z4789 Encounter for other orthopedic aftercare: Secondary | ICD-10-CM | POA: Insufficient documentation

## 2020-10-29 DIAGNOSIS — Z981 Arthrodesis status: Secondary | ICD-10-CM | POA: Diagnosis not present

## 2020-10-29 DIAGNOSIS — M4310 Spondylolisthesis, site unspecified: Secondary | ICD-10-CM | POA: Diagnosis not present

## 2020-10-29 DIAGNOSIS — M48062 Spinal stenosis, lumbar region with neurogenic claudication: Secondary | ICD-10-CM | POA: Diagnosis not present

## 2020-12-30 ENCOUNTER — Ambulatory Visit: Payer: Self-pay | Admitting: Orthopedic Surgery

## 2021-01-07 ENCOUNTER — Other Ambulatory Visit: Payer: Self-pay

## 2021-01-10 ENCOUNTER — Other Ambulatory Visit: Payer: Self-pay

## 2021-01-11 DIAGNOSIS — J449 Chronic obstructive pulmonary disease, unspecified: Secondary | ICD-10-CM | POA: Diagnosis not present

## 2021-01-11 DIAGNOSIS — R072 Precordial pain: Secondary | ICD-10-CM | POA: Diagnosis not present

## 2021-01-11 DIAGNOSIS — I10 Essential (primary) hypertension: Secondary | ICD-10-CM | POA: Diagnosis not present

## 2021-01-11 DIAGNOSIS — R0602 Shortness of breath: Secondary | ICD-10-CM | POA: Diagnosis not present

## 2021-01-12 DIAGNOSIS — F331 Major depressive disorder, recurrent, moderate: Secondary | ICD-10-CM | POA: Diagnosis not present

## 2021-01-12 DIAGNOSIS — Z01818 Encounter for other preprocedural examination: Secondary | ICD-10-CM | POA: Diagnosis not present

## 2021-01-12 DIAGNOSIS — J449 Chronic obstructive pulmonary disease, unspecified: Secondary | ICD-10-CM | POA: Diagnosis not present

## 2021-01-12 DIAGNOSIS — M170 Bilateral primary osteoarthritis of knee: Secondary | ICD-10-CM | POA: Diagnosis not present

## 2021-01-13 DIAGNOSIS — I340 Nonrheumatic mitral (valve) insufficiency: Secondary | ICD-10-CM | POA: Diagnosis not present

## 2021-01-13 DIAGNOSIS — I361 Nonrheumatic tricuspid (valve) insufficiency: Secondary | ICD-10-CM | POA: Diagnosis not present

## 2021-01-13 DIAGNOSIS — R072 Precordial pain: Secondary | ICD-10-CM | POA: Diagnosis not present

## 2021-01-17 ENCOUNTER — Ambulatory Visit: Payer: Self-pay | Admitting: Orthopedic Surgery

## 2021-01-17 ENCOUNTER — Encounter: Payer: Self-pay | Admitting: Vascular Surgery

## 2021-01-17 ENCOUNTER — Other Ambulatory Visit: Payer: Self-pay

## 2021-01-17 ENCOUNTER — Ambulatory Visit (INDEPENDENT_AMBULATORY_CARE_PROVIDER_SITE_OTHER): Payer: Medicare Other | Admitting: Vascular Surgery

## 2021-01-17 VITALS — BP 116/64 | HR 56 | Temp 98.1°F | Resp 16 | Ht 59.0 in | Wt 155.0 lb

## 2021-01-17 DIAGNOSIS — M5137 Other intervertebral disc degeneration, lumbosacral region: Secondary | ICD-10-CM

## 2021-01-17 DIAGNOSIS — M545 Low back pain, unspecified: Secondary | ICD-10-CM | POA: Diagnosis not present

## 2021-01-17 NOTE — H&P (Signed)
Subjective:   [name] is a pleasant 65 year old female with past medical history significant for L3-5 fusion (2 level XLIF with posterior instrumentation), Who has symptomatic spinal stenosis with neurogenic claudication which is a result of a grade 1 borderline 2 anterior listhesis at L5-S1 in addition to progressive and advanced facet hypertrophy that is causing severe central canal stenosis and severe bilateral foraminal stenosis. Approximately 1 year ago, we discussed moving forward With an anterior lumbar interbody fusion to address the same level, however due to COVID this was postponed and today the slip as well as facet hypertrophy and therefore the stenosis have progressed which correlates with the patient's progressive symptoms. Patient has expressed a desire to move forward with surgical intervention. The patient is here today for a pre-operative History and Physical. She is scheduled for total knee replacement extension of hardwear on 01-26-21 with Dr. Rolena Infante at [facility].  Patient Active Problem List   Diagnosis Date Noted  . Encounter for orthopedic follow-up care 10/25/2020  . Pain in left knee 08/17/2020  . Colitis presumed infectious 02/17/2020  . Lesion of liver 02/17/2020  . History of total knee arthroplasty 02/10/2020  . Osteoarthritis of right knee 10/30/2019  . Osteoarthritis of left knee 10/30/2019  . Internal derangement of left knee 07/07/2019  . Bilateral knee pain 03/21/2019  . Low back pain 11/27/2018  . Complication of surgical procedure 06/17/2018  . Degeneration of lumbar intervertebral disc 06/17/2018  . Degenerative spondylolisthesis 06/17/2018  . Acute coronary syndrome ([place]) 04/02/2018  . Hyperlipidemia 08/06/2017  . Influenza due to influenza A virus 01/01/2017  . Acute exacerbation of chronic obstructive airways disease ([place]) 01/01/2017  . Acute renal failure syndrome ([place]) 01/01/2017  . Dehydration 01/01/2017  . Preinfarction syndrome ([place])  10/14/2016  . Chest pain 08/22/2016  . Supraventricular tachycardia ([place]) 08/22/2016  . Obstructive sleep apnea syndrome   . Hypertensive disorder   . Gastroesophageal reflux disease   . Depressive disorder   . Coronary arteriosclerosis   . Anxiety   . Pain in the chest   . Hypokalemia   . Carpal tunnel syndrome of left wrist 02/18/2016  . Leukocytosis 05/26/2014  . Tobacco user 05/26/2014  . Urge incontinence of urine 12/14/2011   Past Medical History:  Diagnosis Date  . Acute coronary syndrome ([place])   . Acute exacerbation of chronic obstructive airways disease ([place])   . Acute renal failure syndrome ([place])   . Agoraphobia without history of panic disorder   . Anginal pain ([place])   . Anxiety   . Arthritis    "qwhere" (04/03/2018)  . Bilateral carpal tunnel syndrome 02/18/2016  . Bilateral knee pain   . Carpal tunnel syndrome of left wrist   . Chest pain   . Childhood asthma   . Chronic bronchitis ([place])   . Chronic lower back pain   . Colitis presumed infectious   . COPD (chronic obstructive pulmonary disease) ([place])   . Coronary artery disease CARDIOLOGIST- DR  Doylene Canard  (VISIT 03-30-11 W/ CHART)   NON-OBS. CAD   (STRESS TEST NOV. 2011  . Depression   . DJD (degenerative joint disease)    JOINT PAIN  . Dyspnea    "anytime but mostly on exertion and smoking"  . Dysrhythmia 2017   SVT  . Elevated liver enzymes   . Fibromyalgia   . Frequency of urination   . GERD (gastroesophageal reflux disease)     W/ NEXIUM  . Heart murmur   . Hemorrhoids   .  High cholesterol   . History of blood transfusion    "related to anemia" (04/03/2018)  . History of carpal tunnel syndrome    Right  . History of gastric ulcer 2004  . Hyperlipidemia   . Hypertension   . IBS (irritable bowel syndrome)   . Incisional pain s/p interstim implant 1st stage--- 12-07-11    left upper buttock-- pt states dressing clean dry and intact (on 12-08-11)  . Influenza due to influenza A virus   .  Insomnia   . Internal derangement of left knee   . Iron deficiency anemia 2004  . Lesion of liver   . Leukocytosis   . Lumbar pain   . Nocturia   . Non-productive cough   . Numbness in both hands AT TIMES  . OSA (obstructive sleep apnea)    "suppose to wear mask; I don't" (04/03/2018)  . Osteoarthritis of left knee   . PONV (postoperative nausea and vomiting)   . Post laminectomy syndrome   . Preinfarction syndrome ([place])   . Supraventricular tachycardia ([place])   . SVT (supraventricular tachycardia) ([place])   . Tobacco user   . Urge urinary incontinence     Past Surgical History:  Procedure Laterality Date  . ANTERIOR CERVICAL DECOMP/DISCECTOMY FUSION  2008   C4 - T1 ("screws from 1st OR came out")  . ANTERIOR CERVICAL DECOMP/DISCECTOMY FUSION  2002   C4 - 7  . ANTERIOR LATERAL LUMBAR FUSION WITH PERCUTANEOUS SCREW 2 LEVEL Left 11/27/2018   Procedure: XLIF Lumbar 3-5, posterior spinal fusion L3-5;  Surgeon: Melina Schools, MD;  Location: [facility];  Service: Orthopedics;  Laterality: Left;  5.5 hrs for entire procedure  . APPENDECTOMY  1982  . BACK SURGERY    . CARDIAC CATHETERIZATION  2003  . CARDIAC CATHETERIZATION N/A 08/23/2016   Procedure: Left Heart Cath and Coronary Angiography;  Surgeon: Dixie Dials, MD;  Location: [facility];  Service: Cardiovascular;  Laterality: N/A;  . CARPAL TUNNEL RELEASE Right   . CATARACT EXTRACTION W/ INTRAOCULAR LENS  IMPLANT, BILATERAL  2016-2018  . CHOLECYSTECTOMY OPEN  1982  . COLONOSCOPY    . CYSTO/ HOD/ BLADDER BX  2006  . CYSTO/ HOD/ BLADDER BX/ FULGERATION  06-12-2011  . FINGER SURGERY Right 2001   "replaced worn cartilage on thumb w/tendons"  . HYSTEROSCOPY WITH D & C  2005  . INTERSTIM IMPLANT PLACEMENT  12/07/2011   Procedure: Barrie Lyme IMPLANT FIRST STAGE;  Surgeon: [name], MD;  Location: [facility];  Service: Urology;  Laterality: Right;  . INTERSTIM IMPLANT PLACEMENT  12/14/2011   Procedure:  Barrie Lyme IMPLANT SECOND STAGE;  Surgeon: [name], MD;  Location: [facility];  Service: Urology;  Laterality: Right;  rad tech ok by vickie at [facility]  . LUMBAR LAMINECTOMY  2007   L3 - 5  . TONSILLECTOMY AND ADENOIDECTOMY  1965  . TOTAL KNEE ARTHROPLASTY Right 02/10/2020   Procedure: TOTAL KNEE ARTHROPLASTY;  Surgeon: Paralee Cancel, MD;  Location: [facility];  Service: Orthopedics;  Laterality: Right;  70 mins  . TOTAL KNEE ARTHROPLASTY Left 07/20/2020   Procedure: TOTAL KNEE ARTHROPLASTY;  Surgeon: Paralee Cancel, MD;  Location: [facility];  Service: Orthopedics;  Laterality: Left;  . TUBAL LIGATION    . UPPER GI ENDOSCOPY    . WRIST SURGERY Left    TFCC ("tendon repair")    Current Outpatient Medications  Medication Sig Dispense Refill Last Dose  . albuterol (PROVENTIL HFA;VENTOLIN HFA) 108 (  90 BASE) MCG/ACT inhaler Inhale 2 puffs into the lungs every 6 (six) hours as needed for wheezing or shortness of breath.     . busPIRone (BUSPAR) 10 MG tablet buspirone 10 mg tablet  1 TABLET TWICE A DAY FOR DEPRESSION ORALLY 90 DAYS     . clonazePAM (KLONOPIN) 1 MG tablet Take 1 tablet (1 mg total) by mouth at bedtime. 30 tablet 5   . diltiazem (TIAZAC) 120 MG 24 hr capsule diltiazem ER 120 mg capsule,24 hr,extended release   120 mg by oral route.     . DULoxetine (CYMBALTA) 60 MG capsule Take 60 mg by mouth at bedtime.      Marland Kitchen ezetimibe (ZETIA) 10 MG tablet ezetimibe 10 mg tablet  TAKE 1 TABLET BY MOUTH ONCE DAILY     . ferrous sulfate 325 (65 FE) MG tablet Take 1 tablet (325 mg total) by mouth 2 (two) times daily with a meal for 14 days. 28 tablet 0   . gabapentin (NEURONTIN) 100 MG capsule Take 100 mg by mouth 3 (three) times daily.     . hydrALAZINE (APRESOLINE) 25 MG tablet Take 25 mg by mouth daily.      Marland Kitchen HYDROcodone-acetaminophen (NORCO/VICODIN) 5-325 MG tablet Take by mouth.     . melatonin 1 MG TABS tablet melatonin     . methocarbamol (ROBAXIN) 500 MG tablet Take 1 tablet  (500 mg total) by mouth every 6 (six) hours as needed for muscle spasms. (Patient not taking: Reported on 10/13/2020) 40 tablet 0   . metoprolol tartrate (LOPRESSOR) 25 MG tablet Take 0.5 tablets (12.5 mg total) by mouth 2 (two) times daily. 30 tablet 3   . nitroGLYCERIN (NITROSTAT) 0.4 MG SL tablet Place 1 tablet (0.4 mg total) under the tongue every 5 (five) minutes x 3 doses as needed for chest pain. 10 tablet 0   . ondansetron (ZOFRAN) 4 MG tablet ondansetron HCl 4 mg tablet     . pantoprazole (PROTONIX) 40 MG tablet Take 1 tablet (40 mg total) by mouth 2 (two) times daily. 60 tablet 0   . polyethylene glycol (MIRALAX / GLYCOLAX) 17 g packet Take 17 g by mouth 2 (two) times daily. (Patient not taking: Reported on 06/29/2020) 28 packet 0   . Vitamin D, Ergocalciferol, (DRISDOL) 1.25 MG (50000 UNIT) CAPS capsule Take 50,000 Units by mouth every Wednesday.      No current facility-administered medications for this visit.   Allergies  Allergen Reactions  . Midazolam Hcl Other (See Comments)    HYPER  . Prednisone Other (See Comments)    HYPER, depressed, moody  . Statins Other (See Comments)    Causes muscle weakness and joint pain  . Ace Inhibitors Other (See Comments)    DECREASES HEARTRATE  . Amoxicillin Diarrhea    Did it involve swelling of the face/tongue/throat, SOB, or low BP? No Did it involve sudden or severe rash/hives, skin peeling, or any reaction on the inside of your mouth or nose? No Did you need to seek medical attention at a hospital or doctor's office? No When did it last happen?~2019 or so If all above answers are "NO", may proceed with cephalosporin use.   . Nsaids Other (See Comments)    AVOIDS; HX GASTRIC ULCER  . Sulfa Antibiotics Rash and Other (See Comments)    JOINT PAIN    Social History   Tobacco Use  . Smoking status: Current Every Day Smoker    Packs/day: 2.00    Years:  43.00    Pack years: 86.00    Types: Cigarettes  . Smokeless tobacco: Never  Used  Substance Use Topics  . Alcohol use: Not Currently    Alcohol/week: 0.0 standard drinks    Comment: 04/03/2018 "nothing in years"    Family History  Problem Relation Age of Onset  . Hypertension Father   . Heart disease Father   . Kidney failure Father   . Breast cancer Mother   . Hypertension Mother   . Coronary artery disease Mother   . Alzheimer's disease Mother   . Hypertension Brother   . Hypertension Sister   . Hypertension Daughter     Review of Systems Pertinent items are noted in HPI.  Objective:   Vitals: Ht: 4 ft 11 in 01/17/2021 11:01 am Wt: 154 lbs 01/17/2021 11:01 am BMI: 31.1 01/17/2021 11:01 am BP: 154/66 sitting L arm 01/17/2021 11:00 am Pulse: 53 bpm 01/17/2021 11:01 am  General: AAOX3, well developed and well nourished, NAD   Ambulation:Abnormal gait patter due to pain, cane assistive device   Inspection: No obvious deformity.   Heart: Regular rate and rhythm, no rubs, or gallops  Lungs: Clear to auscultation bilaterally  Abdomen: Bowel sounds 4, nondistended, nontender, no rebound tenderness, no loss of bladder or bowel control.   AROM:  - Knee: flexion and extension normal and pain free bilaterally.  - Ankle: Dorsiflexion, plantarflexion, inversion, eversion normal and pain free.   Dermatomes: Patient describing pain and dysesthesias in the left L5/S1 dermatome pattern   Myotomes:  - 5/5 lower extremity motor strength bilaterally   Reflexes:  - Babinski: Left Ngative, Right Negative  - Clonus: Negative   Special Tests:  - Straight Leg Raise: Left Positive, Right Negative    PV: Extremities warm and well profused. Posterior and dorsalis pedis pulse 2+ bilaterally, No pitting Edema, discoloration, calf tenderness, or palpable cords. Homan's negative bilaterally.    CT myelogram findings. CT myelogram was performed on 10/18/2020 at the outside facility. Both images as well as the report were personally reviewed by me. At L5-S1 there  is a grade 1 borderline 2 anterior listhesis which has progressed compared to previous buddies causing a broad-based disc protrusion which results in severe central canal stenosis severe by foraminal stenosis. There is also progressive advanced facet hypertrophy at this level also contributing to the significant stenosis. Solid fusion L3-5.   Assessment:   [name] is a pleasant 65 year old female with past medical history significant for L3-5 fusion (2 level XLIF with posterior instrumentation), Who has symptomatic spinal stenosis with neurogenic claudication which is a result of a grade 1 borderline 2 anterior listhesis at L5-S1 in addition to progressive and advanced facet hypertrophy that is causing severe central canal stenosis and severe bilateral foraminal stenosis. Approximately 1 year ago, we discussed moving forward With an anterior lumbar interbody fusion to address the same level, however due to COVID this was postponed and today the slip as well as facet hypertrophy and therefore the stenosis have progressed which correlates with the patient's progressive symptoms. After discussing risks and benefits of surgical intervention, the patient has elected to move forward with surgery   Plan:   Plan on L5-S1 anterior lumbar interbody fusion with posterior supplemental fusion instrumentation and extension of previous fusion.  Risks and benefits of surgery were discussed with the patient. These include: Infection, bleeding, death, stroke, paralysis, ongoing or worse pain, need for additional surgery, nonunion, leak of spinal fluid, adjacent segment degeneration requiring additional fusion surgery,  need for posterior decompression and/or fusion. Bleeding from major vessels, and blood clots (deep venous thrombosis)requiring additional treatment. Due to the abdominal contents requiring further intervention, loss in bowel and bladder control. Additional risk for female patients: Retrograde ejaculation, and  therefore infertility.  Risks and benefits of surgery were discussed with the patient. These include: Infection, bleeding, death, stroke, paralysis, ongoing or worse pain, need for additional surgery, nonunion, leak of spinal fluid, adjacent segment degeneration requiring additional fusion surgery, Injury to abdominal vessels that can require anterior surgery to stop bleeding. Malposition of the cage and/or pedicle screws that could require additional surgery. Loss of bowel and bladder control. Postoperative hematoma causing neurologic compression that could require urgent or emergent re-operation.  We are awaiting clearance from the patient's primary care provider and cardiologist. Patient states that she is seeing both of these providers who have cleared her to move forward with surgery, I will work on obtaining the appropriate documentation.  I have also reviewed the patient's medications with her. She will hold her aspirin 7 days prior to surgery. She is not taking any over-the-counter anti-inflammatories.  We have also discussed the post-operative recovery period to include: bathing/showering restrictions, wound healing, activity (and driving) restrictions, medications/pain mangement.  We have also discussed post-operative redflags to include: signs and symptoms of postoperative infection, DVT/PE.  Patient scheduled to be fitted with her LSO brace with PT following this appointment. Additionally, patient does have an appointment with Dr. Donnetta Hutching the vascular surgeon.  Follow-up: 2 weeks postop

## 2021-01-17 NOTE — H&P (Deleted)
  The note originally documented on this encounter has been moved the the encounter in which it belongs.  

## 2021-01-17 NOTE — Progress Notes (Signed)
[facility] [place]  Patient name: [name] MRN: [id number] DOB: Jun 06, 1956 Sex: female  REASON FOR CONSULT: Discuss anterior exposure for L5-S1 fusion with Dr. Rolena Infante  HPI: [name] is a 65 y.o. female, here today for discussion of my role in anterior exposure for L5-S1 anterior lumbar interbody fusion.  Patient said that prior cervical disc surgery and also fusion at the L3-4 and L4-5 level.  She now has had progressive disease at the lower level and is planned to have anterior fusion and posterior screws on 01/26/2021 with Dr. Rolena Infante.  She is seeing me today for discussion of my role and to assure that there is no prohibitive reasons preventing surgery.  She does have a history of prior TIA work-up at that time was normal in 2016.  She had a duplex which was essentially normal from that timeframe  Past Medical History:  Diagnosis Date  . Acute coronary syndrome ([place])   . Acute exacerbation of chronic obstructive airways disease ([place])   . Acute renal failure syndrome ([place])   . Agoraphobia without history of panic disorder   . Anginal pain ([place])   . Anxiety   . Arthritis    "qwhere" (04/03/2018)  . Bilateral carpal tunnel syndrome 02/18/2016  . Bilateral knee pain   . Carpal tunnel syndrome of left wrist   . Chest pain   . Childhood asthma   . Chronic bronchitis ([place])   . Chronic lower back pain   . Colitis presumed infectious   . COPD (chronic obstructive pulmonary disease) ([place])   . Coronary artery disease CARDIOLOGIST- DR  Doylene Canard  (VISIT 03-30-11 W/ CHART)   NON-OBS. CAD   (STRESS TEST NOV. 2011  . Depression   . DJD (degenerative joint disease)    JOINT PAIN  . Dyspnea    "anytime but mostly on exertion and smoking"  . Dysrhythmia 2017   SVT  . Elevated liver enzymes   . Fibromyalgia   . Frequency of urination   . GERD (gastroesophageal reflux disease)     W/ NEXIUM  . Heart murmur   . Hemorrhoids   . High cholesterol   . History  of blood transfusion    "related to anemia" (04/03/2018)  . History of carpal tunnel syndrome    Right  . History of gastric ulcer 2004  . Hyperlipidemia   . Hypertension   . IBS (irritable bowel syndrome)   . Incisional pain s/p interstim implant 1st stage--- 12-07-11    left upper buttock-- pt states dressing clean dry and intact (on 12-08-11)  . Influenza due to influenza A virus   . Insomnia   . Internal derangement of left knee   . Iron deficiency anemia 2004  . Lesion of liver   . Leukocytosis   . Lumbar pain   . Nocturia   . Non-productive cough   . Numbness in both hands AT TIMES  . OSA (obstructive sleep apnea)    "suppose to wear mask; I don't" (04/03/2018)  . Osteoarthritis of left knee   . PONV (postoperative nausea and vomiting)   . Post laminectomy syndrome   . Preinfarction syndrome ([place])   . Supraventricular tachycardia ([place])   . SVT (supraventricular tachycardia) ([place])   . Tobacco user   . Urge urinary incontinence     Family History  Problem Relation Age of Onset  . Hypertension Father   . Heart disease Father   . Kidney failure Father   .  Breast cancer Mother   . Hypertension Mother   . Coronary artery disease Mother   . Alzheimer's disease Mother   . Hypertension Brother   . Hypertension Sister   . Hypertension Daughter     SOCIAL HISTORY: Social History   Socioeconomic History  . Marital status: Widowed    Spouse name: Not on file  . Number of children: Not on file  . Years of education: Not on file  . Highest education level: Not on file  Occupational History  . Not on file  Tobacco Use  . Smoking status: Current Every Day Smoker    Packs/day: 2.00    Years: 43.00    Pack years: 86.00    Types: Cigarettes  . Smokeless tobacco: Never Used  Vaping Use  . Vaping Use: Never used  Substance and Sexual Activity  . Alcohol use: Not Currently    Alcohol/week: 0.0 standard drinks    Comment: 04/03/2018 "nothing in years"  . Drug use: Not  Currently    Types: Marijuana  . Sexual activity: Not Currently    Birth control/protection: Post-menopausal  Other Topics Concern  . Not on file  Social History Narrative   Lives with daughter, granddaughter and daughter's friend in a one story home.    On disability since 2009.  Previously worked as a Scientist, research (medical).    Education: high school   Social Determinants of Radio broadcast assistant Strain: Not on file  Food Insecurity: Not on file  Transportation Needs: Not on file  Physical Activity: Not on file  Stress: Not on file  Social Connections: Not on file  Intimate Partner Violence: Not on file    Allergies  Allergen Reactions  . Midazolam Hcl Other (See Comments)    HYPER  . Prednisone Other (See Comments)    HYPER, depressed, moody  . Statins Other (See Comments)    Causes muscle weakness and joint pain  . Ace Inhibitors Other (See Comments)    DECREASES HEARTRATE  . Amoxicillin Diarrhea    Did it involve swelling of the face/tongue/throat, SOB, or low BP? No Did it involve sudden or severe rash/hives, skin peeling, or any reaction on the inside of your mouth or nose? No Did you need to seek medical attention at a hospital or doctor's office? No When did it last happen?~2019 or so If all above answers are "NO", may proceed with cephalosporin use.   . Nsaids Other (See Comments)    AVOIDS; HX GASTRIC ULCER  . Sulfa Antibiotics Rash and Other (See Comments)    JOINT PAIN    Current Outpatient Medications  Medication Sig Dispense Refill  . acetaminophen (TYLENOL) 500 MG tablet 2 tablets    . albuterol (PROVENTIL HFA;VENTOLIN HFA) 108 (90 BASE) MCG/ACT inhaler Inhale 2 puffs into the lungs every 6 (six) hours as needed for wheezing or shortness of breath.    Marland Kitchen aspirin 81 MG chewable tablet 1 tablet    . clonazePAM (KLONOPIN) 1 MG tablet Take 1 tablet (1 mg total) by mouth at bedtime. 30 tablet 5  . diltiazem (TIAZAC) 120 MG 24 hr capsule  diltiazem ER 120 mg capsule,24 hr,extended release   120 mg by oral route.    . DULoxetine (CYMBALTA) 60 MG capsule Take 60 mg by mouth at bedtime.    . gabapentin (NEURONTIN) 100 MG capsule Take 100 mg by mouth 3 (three) times daily.    . hydrALAZINE (APRESOLINE) 25 MG tablet Take 25  mg by mouth daily.     . melatonin 1 MG TABS tablet melatonin    . metoprolol tartrate (LOPRESSOR) 25 MG tablet Take 0.5 tablets (12.5 mg total) by mouth 2 (two) times daily. 30 tablet 3  . nitroGLYCERIN (NITROSTAT) 0.4 MG SL tablet Place 1 tablet (0.4 mg total) under the tongue every 5 (five) minutes x 3 doses as needed for chest pain. 10 tablet 0  . ondansetron (ZOFRAN) 4 MG tablet ondansetron HCl 4 mg tablet    . pantoprazole (PROTONIX) 40 MG tablet Take 1 tablet (40 mg total) by mouth 2 (two) times daily. 60 tablet 0  . Vitamin D, Ergocalciferol, (DRISDOL) 1.25 MG (50000 UNIT) CAPS capsule Take 50,000 Units by mouth every Wednesday.    . busPIRone (BUSPAR) 10 MG tablet buspirone 10 mg tablet  1 TABLET TWICE A DAY FOR DEPRESSION ORALLY 90 DAYS (Patient not taking: Reported on 01/17/2021)     No current facility-administered medications for this visit.    REVIEW OF SYSTEMS:  [X]  denotes positive finding, [ ]  denotes negative finding Cardiac  Comments:  Chest pain or chest pressure:    Shortness of breath upon exertion: x   Short of breath when lying flat:    Irregular heart rhythm:        Vascular    Pain in calf, thigh, or hip brought on by ambulation: x   Pain in feet at night that wakes you up from your sleep:     Blood clot in your veins:    Leg swelling:         Pulmonary    Oxygen at home:    Productive cough:     Wheezing:         Neurologic    Sudden weakness in arms or legs:     Sudden numbness in arms or legs:     Sudden onset of difficulty speaking or slurred speech:    Temporary loss of vision in one eye:     Problems with dizziness:         Gastrointestinal    Blood in stool:      Vomited blood:         Genitourinary    Burning when urinating:     Blood in urine:        Psychiatric    Major depression:  x       Hematologic    Bleeding problems:    Problems with blood clotting too easily:        Skin    Rashes or ulcers:        Constitutional    Fever or chills:      PHYSICAL EXAM: Vitals:   01/17/21 1437  BP: 116/64  Pulse: (!) 56  Resp: 16  Temp: 98.1 F (36.7 C)  TempSrc: Other (Comment)  SpO2: 95%  Weight: 155 lb (70.3 kg)  Height: 4\' 11"  (1.499 m)    GENERAL: The patient is a well-nourished female, in no acute distress. The vital signs are documented above. VASCULAR: Patient does have a right carotid bruit.  I do not hear a left carotid bruit.  2+ radial pulses bilaterally.  She has 2+ dorsalis pedis pulses bilaterally. PULMONARY: There is good air exchange ABDOMEN: Soft and non-tender.  Well-healed right subcostal cholecystectomy incision.  She does have an incision over her left hip for prior bone graft harvest MUSCULOSKELETAL: There are no major deformities or cyanosis. NEUROLOGIC: No focal weakness or paresthesias are detected.  SKIN: There are no ulcers or rashes noted. PSYCHIATRIC: The patient has a normal affect.  DATA:   CT lumbar studies from 10/18/2020 were reviewed.  This shows normal location of her aortoiliac bifurcation.  She does have extensive calcification of her aorta iliac segments.  MEDICAL ISSUES:  Had a long discussion with the patient regarding my role for exposure.  Explained that Dr. Rolena Infante is determined that surgery is her best option for her spine difficulty.  I explained the technical aspects of anterior exposure.  Explained mobilization of the rectus muscle and retroperitoneal exposure.  I discussed mobilization of the ureter and arterial venous structures overlying the spine.  Discussed potential injury for all of these.  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  I do feel she is a appropriate candidate for anterior exposure. She is not morbidly obese She has had no prior pelvic surgery.  Has had cholecystectomy via subcostal approach She does have aortoiliac calcifications but her aortic bifurcation is above the L5-S1 level.  We will proceed with surgery as planned on 01/26/2021  Of note she does have a right carotid bruit.  Would recommend duplex carotid at some point during her follow-up.  This does not need to be preoperative [place] done.  She had no flow limitation on 2016 study.   Rosetta Posner, MD FACS [facility] [place] Office phone [phone]

## 2021-01-18 NOTE — Pre-Procedure Instructions (Signed)
[id number] [address], [place] [address] Phone: [phone] Fax: [phone]      Your procedure is scheduled on Wednesday, February 9th.  Report to [facility] Main Entrance "A" at 6:30 A.M., and check in at the Admitting office.  Call this number if you have problems the morning of surgery:  [phone]  Call [phone] if you have any questions prior to your surgery date Monday-Friday 8am-4pm    Remember:  Do not eat after midnight the night before your surgery  You may drink clear liquids until 5:30 A.M. the morning of your surgery.   Clear liquids allowed are: Water, Non-Citrus Juices (without pulp), Carbonated Beverages, Clear Tea, Black Coffee Only, and Gatorade    Take these medicines the morning of surgery with A SIP OF WATER  diltiazem (TIAZAC)  gabapentin (NEURONTIN)  metoprolol tartrate (LOPRESSOR)  pantoprazole (PROTONIX) Albuterol inhaler-please bring inhaler with you on the day of surgery. ondansetron (ZOFRAN)-use as needed. nitroGLYCERIN (NITROSTAT)-please let a nurse know if you had to use this.   As of today, STOP taking any Aspirin (unless otherwise instructed by your surgeon) Aleve, Naproxen, Ibuprofen, Motrin, Advil, Goody's, BC's, all herbal medications, fish oil, and all vitamins.             Do not wear jewelry, make up, or nail polish            Do not wear lotions, powders, perfumes, or deodorant.            Do not shave 48 hours prior to surgery.             Do not bring valuables to the hospital.            [facility] is not responsible for any belongings or valuables.  Do NOT Smoke (Tobacco/Vaping) or drink Alcohol 24 hours prior to your procedure If you use a CPAP at night, you may bring all equipment for your overnight stay.   Contacts, glasses, dentures or bridgework may not be worn into surgery.      For patients admitted to the hospital, discharge time will be determined by your  treatment team.   Patients discharged the day of surgery will not be allowed to drive home, and someone needs to stay with them for 24 hours.    Special instructions:   [place]- Preparing For Surgery  Before surgery, you can play an important role. Because skin is not sterile, your skin needs to be as free of germs as possible. You can reduce the number of germs on your skin by washing with CHG (chlorahexidine gluconate) Soap before surgery.  CHG is an antiseptic cleaner which kills germs and bonds with the skin to continue killing germs even after washing.    Oral Hygiene is also important to reduce your risk of infection.  Remember - BRUSH YOUR TEETH THE MORNING OF SURGERY WITH YOUR REGULAR TOOTHPASTE  Please do not use if you have an allergy to CHG or antibacterial soaps. If your skin becomes reddened/irritated stop using the CHG.  Do not shave (including legs and underarms) for at least 48 hours prior to first CHG shower. It is OK to shave your face.  Please follow these instructions carefully.   1. Shower the NIGHT BEFORE SURGERY and the MORNING OF SURGERY with CHG Soap.   2. If you chose to wash your hair, wash your hair first as usual with your normal shampoo.  3. After  you shampoo, rinse your hair and body thoroughly to remove the shampoo.  4. Use CHG as you would any other liquid soap. You can apply CHG directly to the skin and wash gently with a scrungie or a clean washcloth.   5. Apply the CHG Soap to your body ONLY FROM THE NECK DOWN.  Do not use on open wounds or open sores. Avoid contact with your eyes, ears, mouth and genitals (private parts). Wash Face and genitals (private parts)  with your normal soap.   6. Wash thoroughly, paying special attention to the area where your surgery will be performed.  7. Thoroughly rinse your body with warm water from the neck down.  8. DO NOT shower/wash with your normal soap after using and rinsing off the CHG Soap.  9. Pat  yourself dry with a CLEAN TOWEL.  10. Wear CLEAN PAJAMAS to bed the night before surgery  11. Place CLEAN SHEETS on your bed the night of your first shower and DO NOT SLEEP WITH PETS.   Day of Surgery: Wear Clean/Comfortable clothing the morning of surgery Do not apply any deodorants/lotions.   Remember to brush your teeth WITH YOUR REGULAR TOOTHPASTE.   Please read over the following fact sheets that you were given.

## 2021-01-19 ENCOUNTER — Encounter (HOSPITAL_COMMUNITY)
Admission: RE | Admit: 2021-01-19 | Discharge: 2021-01-19 | Disposition: A | Discharge status: Home / Self Care | Payer: Medicare Other | Source: Ambulatory Visit | Attending: Orthopedic Surgery | Admitting: Orthopedic Surgery

## 2021-01-19 ENCOUNTER — Encounter (HOSPITAL_COMMUNITY): Payer: Self-pay

## 2021-01-19 ENCOUNTER — Ambulatory Visit (HOSPITAL_COMMUNITY)
Admission: RE | Admit: 2021-01-19 | Discharge: 2021-01-19 | Disposition: A | Discharge status: Home / Self Care | Payer: Medicare Other | Source: Ambulatory Visit | Attending: Orthopedic Surgery | Admitting: Orthopedic Surgery

## 2021-01-19 ENCOUNTER — Other Ambulatory Visit: Payer: Self-pay

## 2021-01-19 DIAGNOSIS — R7303 Prediabetes: Secondary | ICD-10-CM | POA: Diagnosis not present

## 2021-01-19 DIAGNOSIS — J449 Chronic obstructive pulmonary disease, unspecified: Secondary | ICD-10-CM | POA: Insufficient documentation

## 2021-01-19 DIAGNOSIS — E785 Hyperlipidemia, unspecified: Secondary | ICD-10-CM | POA: Insufficient documentation

## 2021-01-19 DIAGNOSIS — I10 Essential (primary) hypertension: Secondary | ICD-10-CM | POA: Insufficient documentation

## 2021-01-19 DIAGNOSIS — I2510 Atherosclerotic heart disease of native coronary artery without angina pectoris: Secondary | ICD-10-CM | POA: Diagnosis not present

## 2021-01-19 DIAGNOSIS — Z01818 Encounter for other preprocedural examination: Secondary | ICD-10-CM

## 2021-01-19 DIAGNOSIS — G4733 Obstructive sleep apnea (adult) (pediatric): Secondary | ICD-10-CM | POA: Insufficient documentation

## 2021-01-19 LAB — CBC
HCT: 49.7 % — ABNORMAL HIGH (ref 36.0–46.0)
Hemoglobin: 15.5 g/dL — ABNORMAL HIGH (ref 12.0–15.0)
MCH: 27.9 pg (ref 26.0–34.0)
MCHC: 31.2 g/dL (ref 30.0–36.0)
MCV: 89.5 fL (ref 80.0–100.0)
Platelets: 364 10*3/uL (ref 150–400)
RBC: 5.55 MIL/uL — ABNORMAL HIGH (ref 3.87–5.11)
RDW: 13.8 % (ref 11.5–15.5)
WBC: 17.1 10*3/uL — ABNORMAL HIGH (ref 4.0–10.5)
nRBC: 0 % (ref 0.0–0.2)

## 2021-01-19 LAB — URINALYSIS, ROUTINE W REFLEX MICROSCOPIC
Bacteria, UA: NONE SEEN
Bilirubin Urine: NEGATIVE
Glucose, UA: NEGATIVE mg/dL
Hgb urine dipstick: NEGATIVE
Ketones, ur: NEGATIVE mg/dL
Leukocytes,Ua: NEGATIVE
Nitrite: NEGATIVE
Protein, ur: 100 mg/dL — AB
Specific Gravity, Urine: 1.023 (ref 1.005–1.030)
pH: 5 (ref 5.0–8.0)

## 2021-01-19 LAB — APTT: aPTT: 29 seconds (ref 24–36)

## 2021-01-19 LAB — BASIC METABOLIC PANEL
Anion gap: 11 (ref 5–15)
BUN: 17 mg/dL (ref 8–23)
CO2: 28 mmol/L (ref 22–32)
Calcium: 9.8 mg/dL (ref 8.9–10.3)
Chloride: 101 mmol/L (ref 98–111)
Creatinine, Ser: 0.85 mg/dL (ref 0.44–1.00)
GFR, Estimated: >60 mL/min (ref >60)
Glucose, Bld: 102 mg/dL — ABNORMAL HIGH (ref 70–99)
Potassium: 3.6 mmol/L (ref 3.5–5.1)
Sodium: 140 mmol/L (ref 135–145)

## 2021-01-19 LAB — PROTIME-INR
INR: 0.9 (ref 0.8–1.2)
Prothrombin Time: 11.9 seconds (ref 11.4–15.2)

## 2021-01-19 LAB — SURGICAL PCR SCREEN
MRSA, PCR: NEGATIVE
Staphylococcus aureus: POSITIVE — AB

## 2021-01-19 NOTE — Pre-Procedure Instructions (Signed)
[id number] [address], [place] [address] Phone: [phone] Fax: [phone]      Your procedure is scheduled on Wednesday, February 9th.  Report to [facility] Main Entrance "A" at 6:30 A.M., and check in at the Admitting office.  Call this number if you have problems the morning of surgery:  [phone]  Call [phone] if you have any questions prior to your surgery date Monday-Friday 8am-4pm    Remember:  Do not eat or drink after midnight the night before your surgery    Take these medicines the morning of surgery with A SIP OF WATER  diltiazem (TIAZAC)  gabapentin (NEURONTIN)  metoprolol tartrate (LOPRESSOR)  pantoprazole (PROTONIX) Albuterol inhaler-please bring inhaler with you on the day of surgery. ondansetron (ZOFRAN)-use as needed. nitroGLYCERIN (NITROSTAT)-please let a nurse know if you had to use this.   As of today, STOP taking any Aspirin (unless otherwise instructed by your surgeon) Aleve, Naproxen, Ibuprofen, Motrin, Advil, Goody's, BC's, all herbal medications, fish oil, and all vitamins.             Do not wear jewelry, make up, or nail polish            Do not wear lotions, powders, perfumes, or deodorant.            Do not shave 48 hours prior to surgery.             Do not bring valuables to the hospital.            [facility] is not responsible for any belongings or valuables.  Do NOT Smoke (Tobacco/Vaping) or drink Alcohol 24 hours prior to your procedure If you use a CPAP at night, you may bring all equipment for your overnight stay.   Contacts, glasses, dentures or bridgework may not be worn into surgery.      For patients admitted to the hospital, discharge time will be determined by your treatment team.   Patients discharged the day of surgery will not be allowed to drive home, and someone needs to stay with them for 24 hours.    Special instructions:   [place]- Preparing For  Surgery  Before surgery, you can play an important role. Because skin is not sterile, your skin needs to be as free of germs as possible. You can reduce the number of germs on your skin by washing with CHG (chlorahexidine gluconate) Soap before surgery.  CHG is an antiseptic cleaner which kills germs and bonds with the skin to continue killing germs even after washing.    Oral Hygiene is also important to reduce your risk of infection.  Remember - BRUSH YOUR TEETH THE MORNING OF SURGERY WITH YOUR REGULAR TOOTHPASTE  Please do not use if you have an allergy to CHG or antibacterial soaps. If your skin becomes reddened/irritated stop using the CHG.  Do not shave (including legs and underarms) for at least 48 hours prior to first CHG shower. It is OK to shave your face.  Please follow these instructions carefully.   1. Shower the NIGHT BEFORE SURGERY and the MORNING OF SURGERY with CHG Soap.   2. If you chose to wash your hair, wash your hair first as usual with your normal shampoo.  3. After you shampoo, rinse your hair and body thoroughly to remove the shampoo.  4. Use CHG as you would any other liquid soap. You can apply CHG directly to the skin and  wash gently with a scrungie or a clean washcloth.   5. Apply the CHG Soap to your body ONLY FROM THE NECK DOWN.  Do not use on open wounds or open sores. Avoid contact with your eyes, ears, mouth and genitals (private parts). Wash Face and genitals (private parts)  with your normal soap.   6. Wash thoroughly, paying special attention to the area where your surgery will be performed.  7. Thoroughly rinse your body with warm water from the neck down.  8. DO NOT shower/wash with your normal soap after using and rinsing off the CHG Soap.  9. Pat yourself dry with a CLEAN TOWEL.  10. Wear CLEAN PAJAMAS to bed the night before surgery  11. Place CLEAN SHEETS on your bed the night of your first shower and DO NOT SLEEP WITH PETS.   Day of  Surgery: Wear Clean/Comfortable clothing the morning of surgery Do not apply any deodorants/lotions.   Remember to brush your teeth WITH YOUR REGULAR TOOTHPASTE.   Please read over the following fact sheets that you were given.

## 2021-01-19 NOTE — Progress Notes (Signed)
PCP - Dr. Maurice Small Cardiologist - Dr. Dixie Dials  PPM/ICD - Denies  Chest x-ray - 01/19/21 EKG - 02/18/20 Stress Test - Denies ECHO - 08/24/16 Cardiac Cath - 08/23/16  Sleep Study - Yes CPAP - No  Patient denies having diabetes.  Blood Thinner Instructions: N/A Aspirin Instructions: 7 days prior to sx; LD: 01/19/21  ERAS Protcol - No  COVID TEST- 01/22/21   Anesthesia review: Yes, cardiac hx; per MD order  Patient denies shortness of breath, fever, cough and chest pain at PAT appointment   All instructions explained to the patient, with a verbal understanding of the material. Patient agrees to go over the instructions while at home for a better understanding. Patient also instructed to self quarantine after being tested for COVID-19. The opportunity to ask questions was provided.

## 2021-01-20 DIAGNOSIS — G5602 Carpal tunnel syndrome, left upper limb: Secondary | ICD-10-CM | POA: Diagnosis not present

## 2021-01-20 NOTE — Anesthesia Preprocedure Evaluation (Addendum)
Anesthesia Evaluation  Patient identified by MRN, date of birth, ID band Patient awake    Reviewed: Allergy & Precautions, NPO status , Patient's Chart, lab work & pertinent test results  History of Anesthesia Complications (+) PONV and history of anesthetic complications  Airway Mallampati: II  TM Distance: >3 FB Neck ROM: Full    Dental no notable dental hx. (+) Teeth Intact, Chipped, Dental Advisory Given,    Pulmonary shortness of breath and with exertion, sleep apnea (non-compliant with CPAP) , COPD,  COPD inhaler, Current Smoker and Patient abstained from smoking.,  Still smoking, 86 pack year history >2ppd Inhalers expired SOB with rest and exertion    Pulmonary exam normal breath sounds clear to auscultation       Cardiovascular hypertension, Pt. on medications and Pt. on home beta blockers + CAD (cath 2017 non-obstructive CAD) and +CHF (grade 1 diastolic dysfunction)  Normal cardiovascular exam+ dysrhythmias Supra Ventricular Tachycardia  Rhythm:Regular Rate:Normal  Last echo 2017: - Left ventricle: The cavity size was normal. There was mild  concentric hypertrophy. Systolic function was normal. The  estimated ejection fraction was in the range of 60% to 65%. Wall  motion was normal; there were no regional wall motion  abnormalities. Doppler parameters are consistent with abnormal  left ventricular relaxation (grade 1 diastolic dysfunction).  - Mitral valve: Calcified annulus.   Cath 2017:  Prox RCA lesion, 45 %stenosed.  Ost LAD to Prox LAD lesion, 25 %stenosed.  Mid LAD to Dist LAD lesion, 40 %stenosed.  Dist LAD lesion, 60 %stenosed.  Ost 1st Mrg lesion, 20 %stenosed.  The left ventricular systolic function is normal.  LV end diastolic pressure is normal.  Ost 2nd Mrg lesion, 90 %stenosed.   Life style modification. Medical treatment as OM 2 is relatively small size vessel.      Neuro/Psych PSYCHIATRIC DISORDERS (agoraphobia) Anxiety Depression  Neuromuscular disease (peripheral neuropathy b/l hands)    GI/Hepatic Neg liver ROS, GERD  Medicated and Controlled,Hx gastric ulcer- avoids NSAIDS   Endo/Other  Obesity BMI 31  Renal/GU negative Renal ROS Bladder dysfunction (urinary frequency, nocturia)      Musculoskeletal  (+) Arthritis , Osteoarthritis,  Fibromyalgia -Chronic LBP s/p stim, post-laminectomy syn Spinal stenosis with neurogenic claudication, spondylolisthesis S/p prior ant lumbar fusion   Abdominal   Peds  Hematology negative hematology ROS (+) hct 49.7, plt 364   Anesthesia Other Findings   Reproductive/Obstetrics negative OB ROS                           Anesthesia Physical Anesthesia Plan  ASA: III  Anesthesia Plan: General and Regional   Post-op Pain Management: GA combined w/ Regional for post-op pain   Induction: Intravenous  PONV Risk Score and Plan: 3 and Ondansetron, Dexamethasone, Treatment may vary due to age or medical condition, Scopolamine patch - Pre-op, Propofol infusion and Metaclopromide  Airway Management Planned: Oral ETT  Additional Equipment: None  Intra-op Plan:   Post-operative Plan: Extubation in OR  Informed Consent: I have reviewed the patients History and Physical, chart, labs and discussed the procedure including the risks, benefits and alternatives for the proposed anesthesia with the patient or authorized representative who has indicated his/her understanding and acceptance.     Dental advisory given  Plan Discussed with: CRNA  Anesthesia Plan Comments: (  )      Anesthesia Quick Evaluation

## 2021-01-20 NOTE — Progress Notes (Signed)
Anesthesia Chart Review:  Previously followed with EP cardiology for history of SVT (mid-RP, adenosine sensitive).  Last seen 05/26/2020, stable at that time, no symptoms to suggest recurrence.  At that time she was advised to follow-up with EP cardiology as needed.   Follows with general cardiologist [name] for history of HTN, HLD, nonobstructive CAD.  Clearance from [name] dated 06/01/2020 states moderate risk due to comorbidities of "HTN, OSA, HLD, pre-DM, COPD, well controlled.  No further testing needed."  Due to length of time from that initial clearance, new form was sent to [name] by Dr. Charmayne Sheer office and the patient was again cleared on 01/13/2021.  Copies of both clearances on chart.  OSA, history of noncompliance with CPAP.  History of COPD/chronic bronchitis.  History of chronic leukocytosis.  Review of labs from the past 2 years shows baseline WBC ~20-25.  WBC 17.1 on preop labs. Remainder of preop labs unremarkable  EKG 02/16/2020: Sinus rhythm.  Rate 68. Anterior infarct, old. Abnormal T, consider ischemia, lateral leads. Baseline wander in lead(s) V6. Similar to Aug 2020 tracing.  Event monitor 10/03/16: Minimum HR: 51 BPM at 6:04:06 AM Maximum HR: 113 BPM at 4:02:31 PM Average HR: 79 BPM  Sinus rhythm 1.4% PACs APCs occasionally associated with palpitations  Myocardial Imaging 07/04/2019 IMPRESSION: 1. No reversible ischemia. Stable anteroapical and apicoseptal defects. 2. Marked septal hypokinesis. 3. Left ventricular ejection fraction 43 % 4. Non invasive risk stratification*: Intermediate  Echo 08/24/2016 Study Conclusions   - Left ventricle: The cavity size was normal. There was mild  concentric hypertrophy. Systolic function was normal. The  estimated ejection fraction was in the range of 60% to 65%. Wall  motion was normal; there were no regional wall motion  abnormalities. Doppler parameters are consistent with abnormal  left  ventricular relaxation (grade 1 diastolic dysfunction).  - Mitral valve: Calcified annulus.   Cardiac Cath 08/23/2016  Prox RCA lesion, 45 %stenosed.  Ost LAD to Prox LAD lesion, 25 %stenosed.  Mid LAD to Dist LAD lesion, 40 %stenosed.  Dist LAD lesion, 60 %stenosed.  Ost 1st Mrg lesion, 20 %stenosed.  The left ventricular systolic function is normal.  LV end diastolic pressure is normal.  Ost 2nd Mrg lesion, 90 %stenosed.  Life style modification. Medical treatment as OM 2 is relatively small size vessel.   Wynonia Musty [facility]/Anesthesiology Phone [phone] 01/20/2021 3:47 PM

## 2021-01-22 ENCOUNTER — Other Ambulatory Visit (HOSPITAL_COMMUNITY)
Admission: RE | Admit: 2021-01-22 | Discharge: 2021-01-22 | Disposition: A | Discharge status: Home / Self Care | Payer: Medicare Other | Source: Ambulatory Visit | Attending: Orthopedic Surgery | Admitting: Orthopedic Surgery

## 2021-01-22 DIAGNOSIS — Z01812 Encounter for preprocedural laboratory examination: Secondary | ICD-10-CM | POA: Diagnosis not present

## 2021-01-22 DIAGNOSIS — Z20822 Contact with and (suspected) exposure to covid-19: Secondary | ICD-10-CM | POA: Diagnosis not present

## 2021-01-22 LAB — SARS CORONAVIRUS 2 (TAT 6-24 HRS): SARS Coronavirus 2: NEGATIVE

## 2021-01-25 ENCOUNTER — Ambulatory Visit: Payer: Self-pay | Admitting: Orthopedic Surgery

## 2021-01-26 ENCOUNTER — Other Ambulatory Visit: Payer: Self-pay

## 2021-01-26 ENCOUNTER — Inpatient Hospital Stay (HOSPITAL_COMMUNITY)
Admission: RE | Admit: 2021-01-26 | Discharge: 2021-01-28 | DRG: 454 | Disposition: A | Discharge status: Home Health | Payer: Medicare Other | Attending: Orthopedic Surgery | Admitting: Orthopedic Surgery

## 2021-01-26 ENCOUNTER — Inpatient Hospital Stay (HOSPITAL_COMMUNITY): Payer: Medicare Other

## 2021-01-26 ENCOUNTER — Encounter (HOSPITAL_COMMUNITY)
Admission: RE | Disposition: A | Discharge status: Home Health | Payer: Self-pay | Source: Home / Self Care | Attending: Orthopedic Surgery

## 2021-01-26 ENCOUNTER — Encounter (HOSPITAL_COMMUNITY): Payer: Self-pay | Admitting: Orthopedic Surgery

## 2021-01-26 ENCOUNTER — Inpatient Hospital Stay (HOSPITAL_COMMUNITY): Payer: Medicare Other | Admitting: Physician Assistant

## 2021-01-26 DIAGNOSIS — J449 Chronic obstructive pulmonary disease, unspecified: Secondary | ICD-10-CM | POA: Diagnosis present

## 2021-01-26 DIAGNOSIS — F17210 Nicotine dependence, cigarettes, uncomplicated: Secondary | ICD-10-CM | POA: Diagnosis present

## 2021-01-26 DIAGNOSIS — Z8711 Personal history of peptic ulcer disease: Secondary | ICD-10-CM | POA: Diagnosis not present

## 2021-01-26 DIAGNOSIS — K589 Irritable bowel syndrome without diarrhea: Secondary | ICD-10-CM | POA: Diagnosis present

## 2021-01-26 DIAGNOSIS — M4807 Spinal stenosis, lumbosacral region: Secondary | ICD-10-CM | POA: Diagnosis present

## 2021-01-26 DIAGNOSIS — R351 Nocturia: Secondary | ICD-10-CM | POA: Diagnosis present

## 2021-01-26 DIAGNOSIS — Z803 Family history of malignant neoplasm of breast: Secondary | ICD-10-CM

## 2021-01-26 DIAGNOSIS — Z882 Allergy status to sulfonamides status: Secondary | ICD-10-CM

## 2021-01-26 DIAGNOSIS — M48062 Spinal stenosis, lumbar region with neurogenic claudication: Secondary | ICD-10-CM | POA: Diagnosis present

## 2021-01-26 DIAGNOSIS — G8918 Other acute postprocedural pain: Secondary | ICD-10-CM | POA: Diagnosis not present

## 2021-01-26 DIAGNOSIS — M4317 Spondylolisthesis, lumbosacral region: Secondary | ICD-10-CM | POA: Diagnosis not present

## 2021-01-26 DIAGNOSIS — Z9889 Other specified postprocedural states: Secondary | ICD-10-CM

## 2021-01-26 DIAGNOSIS — Z981 Arthrodesis status: Secondary | ICD-10-CM | POA: Diagnosis not present

## 2021-01-26 DIAGNOSIS — F419 Anxiety disorder, unspecified: Secondary | ICD-10-CM | POA: Diagnosis present

## 2021-01-26 DIAGNOSIS — M5417 Radiculopathy, lumbosacral region: Secondary | ICD-10-CM | POA: Diagnosis not present

## 2021-01-26 DIAGNOSIS — Z79899 Other long term (current) drug therapy: Secondary | ICD-10-CM | POA: Diagnosis not present

## 2021-01-26 DIAGNOSIS — Z886 Allergy status to analgesic agent status: Secondary | ICD-10-CM | POA: Diagnosis not present

## 2021-01-26 DIAGNOSIS — M4316 Spondylolisthesis, lumbar region: Secondary | ICD-10-CM | POA: Diagnosis not present

## 2021-01-26 DIAGNOSIS — Z820 Family history of epilepsy and other diseases of the nervous system: Secondary | ICD-10-CM

## 2021-01-26 DIAGNOSIS — Z888 Allergy status to other drugs, medicaments and biological substances status: Secondary | ICD-10-CM | POA: Diagnosis not present

## 2021-01-26 DIAGNOSIS — Z20822 Contact with and (suspected) exposure to covid-19: Secondary | ICD-10-CM | POA: Diagnosis not present

## 2021-01-26 DIAGNOSIS — Z880 Allergy status to penicillin: Secondary | ICD-10-CM | POA: Diagnosis not present

## 2021-01-26 DIAGNOSIS — M5116 Intervertebral disc disorders with radiculopathy, lumbar region: Secondary | ICD-10-CM | POA: Diagnosis not present

## 2021-01-26 DIAGNOSIS — M5136 Other intervertebral disc degeneration, lumbar region: Secondary | ICD-10-CM | POA: Diagnosis not present

## 2021-01-26 DIAGNOSIS — Z96653 Presence of artificial knee joint, bilateral: Secondary | ICD-10-CM | POA: Diagnosis present

## 2021-01-26 DIAGNOSIS — K219 Gastro-esophageal reflux disease without esophagitis: Secondary | ICD-10-CM | POA: Diagnosis present

## 2021-01-26 DIAGNOSIS — Z419 Encounter for procedure for purposes other than remedying health state, unspecified: Secondary | ICD-10-CM

## 2021-01-26 DIAGNOSIS — F32A Depression, unspecified: Secondary | ICD-10-CM | POA: Diagnosis not present

## 2021-01-26 DIAGNOSIS — Z841 Family history of disorders of kidney and ureter: Secondary | ICD-10-CM

## 2021-01-26 DIAGNOSIS — F4002 Agoraphobia without panic disorder: Secondary | ICD-10-CM | POA: Diagnosis present

## 2021-01-26 DIAGNOSIS — I110 Hypertensive heart disease with heart failure: Secondary | ICD-10-CM | POA: Diagnosis not present

## 2021-01-26 DIAGNOSIS — I509 Heart failure, unspecified: Secondary | ICD-10-CM | POA: Diagnosis not present

## 2021-01-26 DIAGNOSIS — I2510 Atherosclerotic heart disease of native coronary artery without angina pectoris: Secondary | ICD-10-CM | POA: Diagnosis present

## 2021-01-26 DIAGNOSIS — I471 Supraventricular tachycardia: Secondary | ICD-10-CM | POA: Diagnosis present

## 2021-01-26 DIAGNOSIS — Z0389 Encounter for observation for other suspected diseases and conditions ruled out: Secondary | ICD-10-CM | POA: Diagnosis not present

## 2021-01-26 DIAGNOSIS — Z8249 Family history of ischemic heart disease and other diseases of the circulatory system: Secondary | ICD-10-CM

## 2021-01-26 DIAGNOSIS — I10 Essential (primary) hypertension: Secondary | ICD-10-CM | POA: Diagnosis not present

## 2021-01-26 DIAGNOSIS — G8929 Other chronic pain: Secondary | ICD-10-CM | POA: Diagnosis present

## 2021-01-26 DIAGNOSIS — M4326 Fusion of spine, lumbar region: Secondary | ICD-10-CM | POA: Diagnosis not present

## 2021-01-26 DIAGNOSIS — R0602 Shortness of breath: Secondary | ICD-10-CM | POA: Diagnosis not present

## 2021-01-26 DIAGNOSIS — Z6831 Body mass index (BMI) 31.0-31.9, adult: Secondary | ICD-10-CM

## 2021-01-26 DIAGNOSIS — M5117 Intervertebral disc disorders with radiculopathy, lumbosacral region: Secondary | ICD-10-CM | POA: Diagnosis not present

## 2021-01-26 DIAGNOSIS — E669 Obesity, unspecified: Secondary | ICD-10-CM | POA: Diagnosis present

## 2021-01-26 DIAGNOSIS — M797 Fibromyalgia: Secondary | ICD-10-CM | POA: Diagnosis present

## 2021-01-26 HISTORY — PX: ABDOMINAL EXPOSURE: SHX5708

## 2021-01-26 HISTORY — PX: ANTERIOR LUMBAR FUSION: SHX1170

## 2021-01-26 LAB — CBC
HCT: 39.2 % (ref 36.0–46.0)
Hemoglobin: 12.5 g/dL (ref 12.0–15.0)
MCH: 28.9 pg (ref 26.0–34.0)
MCHC: 31.9 g/dL (ref 30.0–36.0)
MCV: 90.7 fL (ref 80.0–100.0)
Platelets: 246 10*3/uL (ref 150–400)
RBC: 4.32 MIL/uL (ref 3.87–5.11)
RDW: 13.8 % (ref 11.5–15.5)
WBC: 24.3 10*3/uL — ABNORMAL HIGH (ref 4.0–10.5)
nRBC: 0 % (ref 0.0–0.2)

## 2021-01-26 LAB — CREATININE, SERUM
Creatinine, Ser: 1.11 mg/dL — ABNORMAL HIGH (ref 0.44–1.00)
GFR, Estimated: 54 mL/min — ABNORMAL LOW (ref >60)

## 2021-01-26 LAB — TYPE AND SCREEN
ABO/RH(D): A NEG
Antibody Screen: NEGATIVE

## 2021-01-26 SURGERY — ANTERIOR LUMBAR FUSION 1 LEVEL
Anesthesia: Regional | Site: Spine Lumbar

## 2021-01-26 MED ORDER — CHLORHEXIDINE GLUCONATE 4 % EX LIQD: 60.0000 mL | Freq: Once | CUTANEOUS | Status: DC

## 2021-01-26 MED ORDER — PHENYLEPHRINE HCL-NACL 10-0.9 MG/250ML-% IV SOLN
INTRAVENOUS | Status: DC | PRN
  Administered 2021-01-26: 50 ug/min via INTRAVENOUS

## 2021-01-26 MED ORDER — BUPIVACAINE HCL (PF) 0.25 % IJ SOLN
INTRAMUSCULAR | Status: AC
  Filled 2021-01-26: qty 30

## 2021-01-26 MED ORDER — THROMBIN (RECOMBINANT) 20000 UNITS EX SOLR
CUTANEOUS | Status: AC
  Filled 2021-01-26: qty 20000

## 2021-01-26 MED ORDER — OXYCODONE-ACETAMINOPHEN 10-325 MG PO TABS: 1.0000 | ORAL_TABLET | Freq: Four times a day (QID) | ORAL | 0 refills | Status: AC | PRN

## 2021-01-26 MED ORDER — ALBUMIN HUMAN 5 % IV SOLN: INTRAVENOUS | Status: DC | PRN

## 2021-01-26 MED ORDER — 0.9 % SODIUM CHLORIDE (POUR BTL) OPTIME
TOPICAL | Status: DC | PRN
  Administered 2021-01-26 (×3): 1000 mL

## 2021-01-26 MED ORDER — CLONAZEPAM 0.5 MG PO TABS
1.0000 mg | ORAL_TABLET | Freq: Every day | ORAL | Status: DC
  Administered 2021-01-26: 1 mg via ORAL
  Filled 2021-01-26: qty 2

## 2021-01-26 MED ORDER — EPHEDRINE SULFATE-NACL 50-0.9 MG/10ML-% IV SOSY
PREFILLED_SYRINGE | INTRAVENOUS | Status: DC | PRN
  Administered 2021-01-26: 5 mg via INTRAVENOUS
  Administered 2021-01-26: 10 mg via INTRAVENOUS

## 2021-01-26 MED ORDER — HYDRALAZINE HCL 10 MG PO TABS
25.0000 mg | ORAL_TABLET | Freq: Every day | ORAL | Status: DC
Start: 2021-01-26 — End: 2021-01-28
  Administered 2021-01-26: 25 mg via ORAL
  Filled 2021-01-26 (×3): qty 3

## 2021-01-26 MED ORDER — DEXAMETHASONE SODIUM PHOSPHATE 10 MG/ML IJ SOLN
INTRAMUSCULAR | Status: DC | PRN
  Administered 2021-01-26: 10 mg via INTRAVENOUS

## 2021-01-26 MED ORDER — THROMBIN 20000 UNITS EX SOLR: CUTANEOUS | Status: DC | PRN

## 2021-01-26 MED ORDER — LACTATED RINGERS IV SOLN: INTRAVENOUS | Status: DC

## 2021-01-26 MED ORDER — HYDROMORPHONE HCL 1 MG/ML IJ SOLN
INTRAMUSCULAR | Status: AC
  Filled 2021-01-26: qty 1

## 2021-01-26 MED ORDER — MORPHINE SULFATE (PF) 2 MG/ML IV SOLN
2.0000 mg | INTRAVENOUS | Status: AC | PRN
Start: 2021-01-26 — End: 2021-01-27

## 2021-01-26 MED ORDER — ROCURONIUM BROMIDE 10 MG/ML (PF) SYRINGE
PREFILLED_SYRINGE | INTRAVENOUS | Status: AC
  Filled 2021-01-26: qty 10

## 2021-01-26 MED ORDER — POLYETHYLENE GLYCOL 3350 17 G PO PACK
17.0000 g | PACK | Freq: Every day | ORAL | Status: DC | PRN
  Administered 2021-01-27: 17 g via ORAL
  Filled 2021-01-26: qty 1

## 2021-01-26 MED ORDER — SODIUM CHLORIDE 0.9% FLUSH: 3.0000 mL | Freq: Two times a day (BID) | INTRAVENOUS | Status: DC

## 2021-01-26 MED ORDER — GABAPENTIN 100 MG PO CAPS
100.0000 mg | ORAL_CAPSULE | Freq: Two times a day (BID) | ORAL | Status: DC
  Administered 2021-01-26 – 2021-01-28 (×4): 100 mg via ORAL
  Filled 2021-01-26 (×4): qty 1

## 2021-01-26 MED ORDER — ACETAMINOPHEN 325 MG PO TABS
650.0000 mg | ORAL_TABLET | ORAL | Status: DC | PRN
  Administered 2021-01-26 – 2021-01-28 (×3): 650 mg via ORAL
  Filled 2021-01-26 (×3): qty 2

## 2021-01-26 MED ORDER — SCOPOLAMINE 1 MG/3DAYS TD PT72
1.0000 | MEDICATED_PATCH | TRANSDERMAL | Status: DC
  Administered 2021-01-26: 1.5 mg via TRANSDERMAL
  Filled 2021-01-26: qty 1

## 2021-01-26 MED ORDER — PROPOFOL 10 MG/ML IV BOLUS
INTRAVENOUS | Status: AC
  Filled 2021-01-26: qty 20

## 2021-01-26 MED ORDER — ENOXAPARIN SODIUM 40 MG/0.4ML ~~LOC~~ SOLN
40.0000 mg | SUBCUTANEOUS | Status: DC
  Administered 2021-01-27: 40 mg via SUBCUTANEOUS
  Filled 2021-01-26: qty 0.4

## 2021-01-26 MED ORDER — DEXMEDETOMIDINE (PRECEDEX) IN NS 20 MCG/5ML (4 MCG/ML) IV SYRINGE
PREFILLED_SYRINGE | INTRAVENOUS | Status: AC
  Filled 2021-01-26: qty 5

## 2021-01-26 MED ORDER — SODIUM CHLORIDE 0.9% FLUSH: 3.0000 mL | INTRAVENOUS | Status: DC | PRN

## 2021-01-26 MED ORDER — AMISULPRIDE (ANTIEMETIC) 5 MG/2ML IV SOLN: 10.0000 mg | Freq: Once | INTRAVENOUS | Status: DC | PRN

## 2021-01-26 MED ORDER — EPHEDRINE 5 MG/ML INJ
INTRAVENOUS | Status: AC
  Filled 2021-01-26: qty 10

## 2021-01-26 MED ORDER — SUFENTANIL CITRATE 50 MCG/ML IV SOLN
INTRAVENOUS | Status: AC
  Filled 2021-01-26: qty 1

## 2021-01-26 MED ORDER — PHENYLEPHRINE HCL (PRESSORS) 10 MG/ML IV SOLN: INTRAVENOUS | Status: DC | PRN

## 2021-01-26 MED ORDER — EPINEPHRINE PF 1 MG/ML IJ SOLN
INTRAMUSCULAR | Status: AC
  Filled 2021-01-26: qty 1

## 2021-01-26 MED ORDER — LIDOCAINE 2% (20 MG/ML) 5 ML SYRINGE
INTRAMUSCULAR | Status: DC | PRN
  Administered 2021-01-26: 20 mg via INTRAVENOUS

## 2021-01-26 MED ORDER — METHOCARBAMOL 500 MG PO TABS
ORAL_TABLET | ORAL | Status: AC
  Filled 2021-01-26: qty 1

## 2021-01-26 MED ORDER — ENOXAPARIN SODIUM 40 MG/0.4ML ~~LOC~~ SOLN: 40.0000 mg | SUBCUTANEOUS | 0 refills | Status: DC

## 2021-01-26 MED ORDER — LACTATED RINGERS IV SOLN: INTRAVENOUS | Status: DC | PRN

## 2021-01-26 MED ORDER — DEXMEDETOMIDINE (PRECEDEX) IN NS 20 MCG/5ML (4 MCG/ML) IV SYRINGE
PREFILLED_SYRINGE | INTRAVENOUS | Status: DC | PRN
  Administered 2021-01-26: 4 ug via INTRAVENOUS
  Administered 2021-01-26: 8 ug via INTRAVENOUS
  Administered 2021-01-26: 4 ug via INTRAVENOUS

## 2021-01-26 MED ORDER — MENTHOL 3 MG MT LOZG: 1.0000 | LOZENGE | OROMUCOSAL | Status: DC | PRN

## 2021-01-26 MED ORDER — BUPIVACAINE-EPINEPHRINE (PF) 0.25% -1:200000 IJ SOLN
INTRAMUSCULAR | Status: DC | PRN
  Administered 2021-01-26: 20 mL

## 2021-01-26 MED ORDER — PROPOFOL 500 MG/50ML IV EMUL
INTRAVENOUS | Status: DC | PRN
  Administered 2021-01-26: 25 ug/kg/min via INTRAVENOUS

## 2021-01-26 MED ORDER — OXYCODONE HCL 5 MG PO TABS: 5.0000 mg | ORAL_TABLET | Freq: Once | ORAL | Status: DC | PRN

## 2021-01-26 MED ORDER — SUGAMMADEX SODIUM 200 MG/2ML IV SOLN
INTRAVENOUS | Status: DC | PRN
  Administered 2021-01-26: 100 mg via INTRAVENOUS

## 2021-01-26 MED ORDER — ACETAMINOPHEN 10 MG/ML IV SOLN
INTRAVENOUS | Status: AC
  Filled 2021-01-26: qty 100

## 2021-01-26 MED ORDER — ALBUTEROL SULFATE HFA 108 (90 BASE) MCG/ACT IN AERS
INHALATION_SPRAY | RESPIRATORY_TRACT | Status: AC
  Filled 2021-01-26: qty 6.7

## 2021-01-26 MED ORDER — METOPROLOL TARTRATE 12.5 MG HALF TABLET
12.5000 mg | ORAL_TABLET | Freq: Two times a day (BID) | ORAL | Status: DC
  Administered 2021-01-26 – 2021-01-28 (×4): 12.5 mg via ORAL
  Filled 2021-01-26 (×4): qty 1

## 2021-01-26 MED ORDER — DILTIAZEM HCL ER COATED BEADS 120 MG PO CP24
120.0000 mg | ORAL_CAPSULE | Freq: Every day | ORAL | Status: DC
  Administered 2021-01-27 – 2021-01-28 (×2): 120 mg via ORAL
  Filled 2021-01-26 (×2): qty 1

## 2021-01-26 MED ORDER — BUPIVACAINE HCL (PF) 0.5 % IJ SOLN
INTRAMUSCULAR | Status: DC | PRN
  Administered 2021-01-26: 20 mL via PERINEURAL

## 2021-01-26 MED ORDER — HEMOSTATIC AGENTS (NO CHARGE) OPTIME
TOPICAL | Status: DC | PRN
  Administered 2021-01-26: 2

## 2021-01-26 MED ORDER — SODIUM CHLORIDE 0.9 % IV SOLN: 250.0000 mL | INTRAVENOUS | Status: DC

## 2021-01-26 MED ORDER — ROCURONIUM BROMIDE 10 MG/ML (PF) SYRINGE
PREFILLED_SYRINGE | INTRAVENOUS | Status: DC | PRN
  Administered 2021-01-26 (×2): 10 mg via INTRAVENOUS
  Administered 2021-01-26: 50 mg via INTRAVENOUS

## 2021-01-26 MED ORDER — LIDOCAINE 2% (20 MG/ML) 5 ML SYRINGE
INTRAMUSCULAR | Status: AC
  Filled 2021-01-26: qty 5

## 2021-01-26 MED ORDER — METHOCARBAMOL 1000 MG/10ML IJ SOLN
500.0000 mg | Freq: Four times a day (QID) | INTRAVENOUS | Status: DC | PRN
  Filled 2021-01-26: qty 5

## 2021-01-26 MED ORDER — VANCOMYCIN HCL IN DEXTROSE 1-5 GM/200ML-% IV SOLN
1000.0000 mg | INTRAVENOUS | Status: AC
  Administered 2021-01-26: 1000 mg via INTRAVENOUS
  Filled 2021-01-26: qty 200

## 2021-01-26 MED ORDER — PHENYLEPHRINE 40 MCG/ML (10ML) SYRINGE FOR IV PUSH (FOR BLOOD PRESSURE SUPPORT)
PREFILLED_SYRINGE | INTRAVENOUS | Status: DC | PRN
  Administered 2021-01-26: 80 ug via INTRAVENOUS
  Administered 2021-01-26: 40 ug via INTRAVENOUS
  Administered 2021-01-26 (×2): 80 ug via INTRAVENOUS
  Administered 2021-01-26: 120 ug via INTRAVENOUS
  Administered 2021-01-26 (×2): 80 ug via INTRAVENOUS

## 2021-01-26 MED ORDER — HYDROMORPHONE HCL 1 MG/ML IJ SOLN
0.2500 mg | INTRAMUSCULAR | Status: DC | PRN
  Administered 2021-01-26 (×3): 0.5 mg via INTRAVENOUS

## 2021-01-26 MED ORDER — PHENYLEPHRINE 40 MCG/ML (10ML) SYRINGE FOR IV PUSH (FOR BLOOD PRESSURE SUPPORT)
PREFILLED_SYRINGE | INTRAVENOUS | Status: AC
  Filled 2021-01-26: qty 10

## 2021-01-26 MED ORDER — SUFENTANIL CITRATE 50 MCG/ML IV SOLN
INTRAVENOUS | Status: DC | PRN
  Administered 2021-01-26 (×3): 10 ug via INTRAVENOUS
  Administered 2021-01-26: 5 ug via INTRAVENOUS
  Administered 2021-01-26: 10 ug via INTRAVENOUS

## 2021-01-26 MED ORDER — BUPIVACAINE LIPOSOME 1.3 % IJ SUSP
INTRAMUSCULAR | Status: DC | PRN
  Administered 2021-01-26: 10 mL via PERINEURAL

## 2021-01-26 MED ORDER — NITROGLYCERIN 0.4 MG SL SUBL: 0.4000 mg | SUBLINGUAL_TABLET | SUBLINGUAL | Status: DC | PRN

## 2021-01-26 MED ORDER — ONDANSETRON HCL 4 MG/2ML IJ SOLN: 4.0000 mg | Freq: Four times a day (QID) | INTRAMUSCULAR | Status: DC | PRN

## 2021-01-26 MED ORDER — SUCCINYLCHOLINE CHLORIDE 200 MG/10ML IV SOSY
PREFILLED_SYRINGE | INTRAVENOUS | Status: AC
  Filled 2021-01-26: qty 10

## 2021-01-26 MED ORDER — ACETAMINOPHEN 10 MG/ML IV SOLN
INTRAVENOUS | Status: DC | PRN
  Administered 2021-01-26: 1000 mg via INTRAVENOUS

## 2021-01-26 MED ORDER — DULOXETINE HCL 30 MG PO CPEP
60.0000 mg | ORAL_CAPSULE | Freq: Every day | ORAL | Status: DC
  Administered 2021-01-26 – 2021-01-27 (×2): 60 mg via ORAL
  Filled 2021-01-26 (×2): qty 2

## 2021-01-26 MED ORDER — ACETAMINOPHEN 500 MG PO TABS
1000.0000 mg | ORAL_TABLET | Freq: Once | ORAL | Status: AC
  Administered 2021-01-26: 1000 mg via ORAL
  Filled 2021-01-26: qty 2

## 2021-01-26 MED ORDER — ONDANSETRON HCL 4 MG/2ML IJ SOLN
INTRAMUSCULAR | Status: DC | PRN
  Administered 2021-01-26: 4 mg via INTRAVENOUS

## 2021-01-26 MED ORDER — ONDANSETRON HCL 4 MG PO TABS: 4.0000 mg | ORAL_TABLET | Freq: Four times a day (QID) | ORAL | Status: DC | PRN

## 2021-01-26 MED ORDER — DOCUSATE SODIUM 100 MG PO CAPS
100.0000 mg | ORAL_CAPSULE | Freq: Two times a day (BID) | ORAL | Status: DC
  Administered 2021-01-26 – 2021-01-28 (×4): 100 mg via ORAL
  Filled 2021-01-26 (×4): qty 1

## 2021-01-26 MED ORDER — ALBUTEROL SULFATE HFA 108 (90 BASE) MCG/ACT IN AERS
INHALATION_SPRAY | RESPIRATORY_TRACT | Status: DC | PRN
  Administered 2021-01-26 (×2): 6 via RESPIRATORY_TRACT

## 2021-01-26 MED ORDER — OXYCODONE HCL 5 MG PO TABS
5.0000 mg | ORAL_TABLET | ORAL | Status: DC | PRN
  Administered 2021-01-27 – 2021-01-28 (×5): 5 mg via ORAL
  Filled 2021-01-26 (×5): qty 1

## 2021-01-26 MED ORDER — PHENOL 1.4 % MT LIQD: 1.0000 | OROMUCOSAL | Status: DC | PRN

## 2021-01-26 MED ORDER — CHLORHEXIDINE GLUCONATE 0.12 % MT SOLN
OROMUCOSAL | Status: AC
  Administered 2021-01-26: 15 mL
  Filled 2021-01-26: qty 15

## 2021-01-26 MED ORDER — DEXAMETHASONE SODIUM PHOSPHATE 10 MG/ML IJ SOLN
INTRAMUSCULAR | Status: AC
  Filled 2021-01-26: qty 1

## 2021-01-26 MED ORDER — ONDANSETRON HCL 4 MG PO TABS: 4.0000 mg | ORAL_TABLET | Freq: Three times a day (TID) | ORAL | 0 refills | Status: DC | PRN

## 2021-01-26 MED ORDER — PROMETHAZINE HCL 25 MG/ML IJ SOLN: 6.2500 mg | INTRAMUSCULAR | Status: DC | PRN

## 2021-01-26 MED ORDER — VANCOMYCIN HCL 1000 MG/200ML IV SOLN
1000.0000 mg | Freq: Once | INTRAVENOUS | Status: AC
  Administered 2021-01-27: 1000 mg via INTRAVENOUS
  Filled 2021-01-26: qty 200

## 2021-01-26 MED ORDER — ALBUTEROL SULFATE HFA 108 (90 BASE) MCG/ACT IN AERS: 2.0000 | INHALATION_SPRAY | Freq: Four times a day (QID) | RESPIRATORY_TRACT | Status: DC | PRN

## 2021-01-26 MED ORDER — OXYCODONE HCL 5 MG/5ML PO SOLN: 5.0000 mg | Freq: Once | ORAL | Status: DC | PRN

## 2021-01-26 MED ORDER — SODIUM CHLORIDE (PF) 0.9 % IJ SOLN
INTRAMUSCULAR | Status: AC
  Filled 2021-01-26: qty 10

## 2021-01-26 MED ORDER — PROPOFOL 10 MG/ML IV BOLUS
INTRAVENOUS | Status: DC | PRN
  Administered 2021-01-26: 150 mg via INTRAVENOUS
  Administered 2021-01-26 (×2): 20 mg via INTRAVENOUS
  Administered 2021-01-26: 10 mg via INTRAVENOUS

## 2021-01-26 MED ORDER — MEPERIDINE HCL 25 MG/ML IJ SOLN: 6.2500 mg | INTRAMUSCULAR | Status: DC | PRN

## 2021-01-26 MED ORDER — OXYCODONE HCL 5 MG PO TABS
10.0000 mg | ORAL_TABLET | ORAL | Status: DC | PRN
  Administered 2021-01-26 – 2021-01-27 (×4): 10 mg via ORAL
  Filled 2021-01-26 (×4): qty 2

## 2021-01-26 MED ORDER — ALBUTEROL SULFATE (2.5 MG/3ML) 0.083% IN NEBU: 2.5000 mg | INHALATION_SOLUTION | Freq: Four times a day (QID) | RESPIRATORY_TRACT | Status: DC | PRN

## 2021-01-26 MED ORDER — METHOCARBAMOL 500 MG PO TABS
500.0000 mg | ORAL_TABLET | Freq: Four times a day (QID) | ORAL | Status: DC | PRN
  Administered 2021-01-26 – 2021-01-28 (×7): 500 mg via ORAL
  Filled 2021-01-26 (×6): qty 1

## 2021-01-26 MED ORDER — METHOCARBAMOL 500 MG PO TABS: 500.0000 mg | ORAL_TABLET | Freq: Three times a day (TID) | ORAL | 0 refills | Status: AC | PRN

## 2021-01-26 MED ORDER — PROPOFOL 1000 MG/100ML IV EMUL
INTRAVENOUS | Status: AC
  Filled 2021-01-26: qty 100

## 2021-01-26 MED ORDER — ACETAMINOPHEN 650 MG RE SUPP: 650.0000 mg | RECTAL | Status: DC | PRN

## 2021-01-26 SURGICAL SUPPLY — 91 items
ADH SKN CLS APL DERMABOND .7 (GAUZE/BANDAGES/DRESSINGS) ×2
AGENT HMST KT MTR STRL THRMB (HEMOSTASIS) ×4
APPLIER CLIP 11 MED OPEN (CLIP) ×3
APR CLP MED 11 20 MLT OPN (CLIP) ×2
BLADE CLIPPER SURG (BLADE) ×1 IMPLANT
BLADE SURG 10 STRL SS (BLADE) ×3 IMPLANT
BONE VIVIGEN FORMABLE 5.4CC (Bone Implant) ×3 IMPLANT
CABLE BIPOLOR RESECTION CORD (MISCELLANEOUS) ×4 IMPLANT
CATH FOLEY 2WAY SLVR  5CC 16FR (CATHETERS) ×3
CATH FOLEY 2WAY SLVR 5CC 16FR (CATHETERS) ×2 IMPLANT
CLIP APPLIE 11 MED OPEN (CLIP) ×4 IMPLANT
CLIP LIGATING EXTRA MED SLVR (CLIP) ×3 IMPLANT
CLIP LIGATING EXTRA SM BLUE (MISCELLANEOUS) ×3 IMPLANT
CLIP NEUROVISION LG (CLIP) ×1 IMPLANT
CLSR STERI-STRIP ANTIMIC 1/2X4 (GAUZE/BANDAGES/DRESSINGS) ×6 IMPLANT
COVER SURGICAL LIGHT HANDLE (MISCELLANEOUS) ×4 IMPLANT
COVER WAND RF STERILE (DRAPES) ×6 IMPLANT
DERMABOND ADVANCED (GAUZE/BANDAGES/DRESSINGS) ×1
DERMABOND ADVANCED .7 DNX12 (GAUZE/BANDAGES/DRESSINGS) ×2 IMPLANT
DEVICE INTERBODY FUSION 15 12D (Neuro Prosthesis/Implant) ×1 IMPLANT
DRAPE C-ARM 42X72 X-RAY (DRAPES) ×6 IMPLANT
DRAPE C-ARMOR (DRAPES) ×4 IMPLANT
DRAPE LAPAROTOMY 100X72X124 (DRAPES) ×1 IMPLANT
DRAPE POUCH INSTRU U-SHP 10X18 (DRAPES) ×3 IMPLANT
DRAPE SURG 17X23 STRL (DRAPES) ×4 IMPLANT
DRAPE U-SHAPE 47X51 STRL (DRAPES) ×4 IMPLANT
DRSG OPSITE POSTOP 4X6 (GAUZE/BANDAGES/DRESSINGS) ×2 IMPLANT
DRSG OPSITE POSTOP 4X8 (GAUZE/BANDAGES/DRESSINGS) ×3 IMPLANT
DURAPREP 26ML APPLICATOR (WOUND CARE) ×4 IMPLANT
ELECT BLADE 4.0 EZ CLEAN MEGAD (MISCELLANEOUS) ×6
ELECT CAUTERY BLADE 6.4 (BLADE) ×3 IMPLANT
ELECT PENCIL ROCKER SW 15FT (MISCELLANEOUS) ×4 IMPLANT
ELECT REM PT RETURN 9FT ADLT (ELECTROSURGICAL) ×3
ELECTRODE BLDE 4.0 EZ CLN MEGD (MISCELLANEOUS) ×4 IMPLANT
ELECTRODE REM PT RTRN 9FT ADLT (ELECTROSURGICAL) ×2 IMPLANT
GAUZE 4X4 16PLY RFD (DISPOSABLE) IMPLANT
GLOVE BIO SURGEON STRL SZ 6.5 (GLOVE) ×3 IMPLANT
GLOVE BIOGEL PI IND STRL 8.5 (GLOVE) ×4 IMPLANT
GLOVE BIOGEL PI INDICATOR 8.5 (GLOVE) ×2
GLOVE SS BIOGEL STRL SZ 7.5 (GLOVE) ×2 IMPLANT
GLOVE SS BIOGEL STRL SZ 8.5 (GLOVE) ×4 IMPLANT
GLOVE SUPERSENSE BIOGEL SZ 7.5 (GLOVE) ×1
GLOVE SUPERSENSE BIOGEL SZ 8.5 (GLOVE) ×2
GLOVE SURG SS PI 6.5 STRL IVOR (GLOVE) ×1 IMPLANT
GLOVE SURG UNDER POLY LF SZ6.5 (GLOVE) ×4 IMPLANT
GLOVE SURG UNDER POLY LF SZ7 (GLOVE) ×1 IMPLANT
GOWN STRL REUS W/ TWL LRG LVL3 (GOWN DISPOSABLE) ×4 IMPLANT
GOWN STRL REUS W/TWL 2XL LVL3 (GOWN DISPOSABLE) ×8 IMPLANT
GOWN STRL REUS W/TWL LRG LVL3 (GOWN DISPOSABLE) ×9
GRAFT BNE MATRIX VG FRMBL MD 5 (Bone Implant) IMPLANT
GUIDEWIRE NITINOL BEVEL TIP (WIRE) ×2 IMPLANT
HEMOSTAT SNOW SURGICEL 2X4 (HEMOSTASIS) IMPLANT
KIT BASIN OR (CUSTOM PROCEDURE TRAY) ×3 IMPLANT
KIT TURNOVER KIT B (KITS) ×3 IMPLANT
LOOP VESSEL MAXI BLUE (MISCELLANEOUS) IMPLANT
LOOP VESSEL MINI RED (MISCELLANEOUS) IMPLANT
MODULE EMG NDL SSEP NVM5 (NEEDLE) IMPLANT
MODULE EMG NEEDLE SSEP NVM5 (NEEDLE) ×3 IMPLANT
MODULE NVM5 NEXT GEN EMG (NEEDLE) ×1 IMPLANT
NDL SPNL 18GX3.5 QUINCKE PK (NEEDLE) ×2 IMPLANT
NEEDLE SPNL 18GX3.5 QUINCKE PK (NEEDLE) ×3 IMPLANT
NS IRRIG 1000ML POUR BTL (IV SOLUTION) ×5 IMPLANT
PACK LAMINECTOMY ORTHO (CUSTOM PROCEDURE TRAY) ×3 IMPLANT
PACK UNIVERSAL I (CUSTOM PROCEDURE TRAY) ×4 IMPLANT
PAD ARMBOARD 7.5X6 YLW CONV (MISCELLANEOUS) ×12 IMPLANT
PROBE BALL TIP NVM5 SNG USE (BALLOONS) ×1 IMPLANT
ROD RELINE 0-0 CON M 5.0/6.0MM (Rod) ×1 IMPLANT
ROD RELINE MAS 5.5X55MM LORD (Rod) ×2 IMPLANT
ROD RELINE O-H CON M 5.0/6.0MM (Rod) ×1 IMPLANT
SCREW 6.5X25 (Screw) ×1 IMPLANT
SCREW LOCK RELINE 5.5 TULIP (Screw) ×5 IMPLANT
SCREW RED MAS POLY 7.5X40MM (Screw) ×2 IMPLANT
SPONGE INTESTINAL PEANUT (DISPOSABLE) ×9 IMPLANT
SPONGE LAP 18X18 RF (DISPOSABLE) IMPLANT
SPONGE LAP 4X18 RFD (DISPOSABLE) IMPLANT
SPONGE SURGIFOAM ABS GEL 100 (HEMOSTASIS) ×1 IMPLANT
STAPLER VISISTAT 35W (STAPLE) IMPLANT
SURGIFLO W/THROMBIN 8M KIT (HEMOSTASIS) ×4 IMPLANT
SUT BONE WAX W31G (SUTURE) ×3 IMPLANT
SUT MNCRL AB 3-0 PS2 18 (SUTURE) ×2 IMPLANT
SUT MNCRL AB 3-0 PS2 27 (SUTURE) ×4 IMPLANT
SUT PDS AB 1 CTX 36 (SUTURE) ×4 IMPLANT
SUT VIC AB 0 CT1 27 (SUTURE) ×3
SUT VIC AB 0 CT1 27XBRD ANBCTR (SUTURE) IMPLANT
SUT VIC AB 1 CT1 18XCR BRD 8 (SUTURE) IMPLANT
SUT VIC AB 1 CT1 8-18 (SUTURE) ×3
SUT VIC AB 2-0 CT1 18 (SUTURE) ×6 IMPLANT
SYR BULB IRRIG 60ML STRL (SYRINGE) ×4 IMPLANT
TOWEL GREEN STERILE (TOWEL DISPOSABLE) ×6 IMPLANT
TOWEL GREEN STERILE FF (TOWEL DISPOSABLE) ×3 IMPLANT
WATER STERILE IRR 1000ML POUR (IV SOLUTION) ×3 IMPLANT

## 2021-01-26 NOTE — CV Procedure (Signed)
BLE venous duplex completed.  Results can be found under chart review under CV PROC. 01/26/2021 5:01 PM Demauri Advincula RVT, RDMS

## 2021-01-26 NOTE — Anesthesia Procedure Notes (Signed)
Anesthesia Regional Block: TAP block   Pre-Anesthetic Checklist: ,, timeout performed, Correct Patient, Correct Site, Correct Laterality, Correct Procedure, Correct Position, site marked, Risks and benefits discussed,  Surgical consent,  Pre-op evaluation,  At surgeon's request and post-op pain management  Laterality: Left  Prep: Maximum Sterile Barrier Precautions used, chloraprep       Needles:  Injection technique: Single-shot  Needle Type: Echogenic Stimulator Needle     Needle Length: 9cm  Needle Gauge: 22     Additional Needles:   Procedures:,,,, ultrasound used (permanent image in chart),,,,  Narrative:  Start time: 01/26/2021 8:00 AM End time: 01/26/2021 8:05 AM Injection made incrementally with aspirations every 5 mL.  Performed by: Personally  Anesthesiologist: Pervis Hocking, DO  Additional Notes: Monitors applied. No increased pain on injection. No increased resistance to injection. Injection made in 5cc increments. Good needle visualization. Patient tolerated procedure well.

## 2021-01-26 NOTE — Op Note (Signed)
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  Previous L3-5 instrumented fusion.  Postoperative diagnosis: Same  Operative procedure: 1.  Anterior lumbar interbody fusion L5-S1.    2.  Posterior spinal fusion (revision) L5-S1.  Complications: None  EBL: 150 cc total  First assistant: Cleta Alberts, PA  Approach surgeon for anterior procedure: Dr. Sherren Mocha Early  Implants: Titan anterior lumbar interbody cage.  15 mm high 12 degree lordotic large cage.  Single 25 mm length locking screw placed into the S1 vertebral body.  NuVasive MIS pedicle screws.  7.5 x 40 mm length screws.  Rod to rod connector x2.  55 mm length rod x2.  Neuro monitoring: Left S1 pedicle screw was directly stimulated and there is no adverse activity greater than 40 mA.  Right S1 screw noted activity at approximately 39 mA.  No abnormal SSEP activity or free running EMG activity.  Allograft: vivogen  Indications: Eilyn is a very pleasant 65 year old woman who had a previous lateral interbody fusion L 3 through 5 with posterior pedicle screw fixation.  Patient has done well with this until recently when she started having significant back buttock and neuropathic leg pain.  Imaging studies confirmed adjacent segment degenerative disease at L5-S1.  Attempts at conservative management had failed to alleviate her symptoms.  As result of the deterioration in her quality of life we elected to move forward with a revision surgery.  Plan was to perform an anterior interbody fusion L5-S1 and then placed pedicle screws into the S1 level and connected to the existing pedicle screw rod construct.  All appropriate risks benefits and alternatives were discussed with the patient and consent was obtained.  Operative report: Patient was brought the operating room placed upon the operating room table.  After successful induction of general anesthesia and endotracheal  intubation, teds, SCDs, and a Foley were placed.  The anterior abdomen was prepped and draped in a standard fashion.  Timeout was taken to confirm patient, procedure, and all other important data.  At this time Dr. Sherren Mocha Early and his assistant performed a standard anterior retroperitoneal approach to the lumbar spine.  Please refer to his dictation for details  Once the retractors were properly position, and the L5-S1 disc space exposed I scrubbed into the case and placed the needle into the disc space.  X-ray was taken to confirm that we are at the appropriate level.  At this time Dr. Curt Jews and his assistant scrubbed out of the case, and Estill Bamberg Ward my PA and I continued with surgery.  10 blade scalpel was used to perform an annulotomy and using pituitary rongeurs curettes and Kerrison rongeurs I removed all the disc material.  I made sure and bleeding subchondral bone by removing all the cartilaginous endplate.  I then posteriorly until I could see the posterior annulus.  I used a 2 and 3 mm Kerrison rongeur to remove the posterior osteophyte from the L5 and S1 vertebral bodies.  I then used a small curved curette to release the annulus from behind the L5 and S1 vertebral bodies.  I then used my small nerve hook to create a plane between the annulus and the posterior longitudinal ligament and I resected this with a 2 mm Kerrison rongeur.  At this point time I felt as though he had an adequate discectomy.  Under live fluoroscopy I placed a distractor into the intervertebral space and made sure had parallel endplate distraction.  At this point  I then trialed the intervertebral spacers.  I elected to use the lordotic (12 degrees) large 15 mm height cage.  This provided the best overall restoration of intervertebral height is well as indirect foraminal decompression.  The implant was obtained and packed with the allograft.  I irrigated the wound copiously normal saline and confirmed once again by rasping the  endplates that had bleeding subchondral bone and all of the cartilaginous endplate had been removed.  I then inserted the implant until I came to rest just shy of the posterior wall the vertebral body.  At this point is quite pleased with the overall position.  To prevent any motion of the graft I placed a single locking screw through the cage and into the S1 vertebral body.  At this point I irrigated the wound copiously normal saline and made sure that hemostasis with direct visualization.  I then removed the retractor blades sequentially and ensure that there was no bleeding.  Once all the retractors were removed I then closed the wound.  Identified the fascia of the rectus and closed with a running #1 PDS stitch.  I then used a 0 Vicryl suture for the deep fascia, and then 2-0 Vicryl suture for superficial.  The skin was then reapproximated with a 3-0 Monocryl.  Steri-Strips and a dry dressing were applied.  At this point the drapes were removed.  The [facility] spine frame was then brought into the surgical suite and placed over the patient.  All bony prominences well-padded and she was then rotated into the prone position.  With the back now exposed I placed the arms in a 90-90 position and placed all appropriate padding.  Once we confirmed satisfactory position on the [facility] spine frame the back was prepped and draped in a standard fashion.  Timeout was taken again to confirm the surgical procedure with the operative team.  Using fluoroscopy identified the lateral border of the S1 pedicle.  I marked this area and infiltrated with quarter percent Marcaine with epinephrine.  Small incision was made and I advanced the Jamshidi needle down to the lateral aspect of the S1 pedicle.  Once I confirmed position I then attached the neuro monitoring device to the Jamshidi needle and using live fluoroscopy as well as neuro monitoring I advanced the Jamshidi needle into the pedicle of S1.  As I advanced towards the  medial wall I switched from the AP view to the lateral view to confirm satisfactory positioning and trajectory.  I confirmed that as I was approaching the medial wall of the pedicle on the AP view that I was already passed the posterior wall of the vertebral body on the lateral view.  I then advanced and then placed the guidepin through the Jamshidi needle to cannulate the S1 pedicle.  Using this exact same technique I cannulated the left S1 pedicle at this point both pedicles were cannulated.  I extended the Wiltsie type incision superiorly and bluntly dissected down to expose the existing pedicle screw rod construct.  Identified the L5 pedicle screw head and then the portion of the rod between the L5 and L4 pedicle screws.  At this point I used a rondure to expose the entire portion of the rod.  A rod to rod connector was then placed and secured into position.  I confirmed satisfactory position of both the left and right rod to rod connectors with AP and lateral fluoroscopy.  I then measured and placed the rod through the pedicle screw at  S1 and through the rod rod connector at the L4-5 level.  I then placed my locking caps and secured them down.  Once I had confirmed satisfactory positioning of the hardware in both planes all of the locking caps were torqued to appropriate tightness according to manufacture standards.  At this point I completed the posterior L5-S1 fusion.  Both wounds were copiously irrigated with normal saline and hemostasis was obtained using bipolar cautery and FloSeal.  I then closed in a layered fashion with interrupted #1 Vicryl suture, 0 Vicryl suture, and the skin was closed with 3-0 Monocryl.  Steri-Strips and dry dressings were applied and the patient was ultimately extubated transfer the PACU without incident.  The end of the case all needle and sponge counts were correct.  After the anterior lumbar interbody fusion x-ray was taken and read by the radiologist confirming there were no  retained surgical instruments.

## 2021-01-26 NOTE — Brief Op Note (Signed)
01/26/2021  1:05 PM  PATIENT:  [name]  65 y.o. female  PRE-OPERATIVE DIAGNOSIS:  Spinal tenosis with neurogenic claudication, spondylolisthesis  POST-OPERATIVE DIAGNOSIS:  Spinal tenosis with neurogenic claudication, spondylolisthesis  PROCEDURE:  Procedure(s) with comments: ANTERIOR LUMBAR FUSION LUMBAR FIVE-SACRAL ONE WITH POSTERIOR SPINAL FUSION INTERBODY (EXTENSION OF PREVIOUS FUSION) (N/A) - 6hrs Dr. Donnetta Hutching to do approach Tap Block with exparel ABDOMINAL EXPOSURE (N/A)  SURGEON:  Surgeon(s) and Role: Panel 1:    Melina Schools, MD - Primary Panel 2:    * Early, Arvilla Meres, MD - Primary    * Cherre Robins, MD  PHYSICIAN ASSISTANT: Cleta Alberts, PA  ASSISTANTS: Estill Bamberg Ward, PA  ANESTHESIA:   general  EBL:  150 mL   BLOOD ADMINISTERED:none  DRAINS: none   LOCAL MEDICATIONS USED:  MARCAINE     SPECIMEN:  No Specimen  DISPOSITION OF SPECIMEN:  N/A  COUNTS:  YES  TOURNIQUET:  * No tourniquets in log *  DICTATION: .Dragon Dictation  PLAN OF CARE: Admit to inpatient   PATIENT DISPOSITION:  PACU - hemodynamically stable.

## 2021-01-26 NOTE — Transfer of Care (Signed)
Immediate Anesthesia Transfer of Care Note  Patient: [name]  Procedure(s) Performed: ANTERIOR LUMBAR FUSION LUMBAR FIVE-SACRAL ONE WITH POSTERIOR SPINAL FUSION INTERBODY (EXTENSION OF PREVIOUS FUSION) (N/A Spine Lumbar) ABDOMINAL EXPOSURE (N/A Abdomen)  Patient Location: PACU  Anesthesia Type:General  Level of Consciousness: drowsy and patient cooperative  Airway & Oxygen Therapy: Patient Spontanous Breathing and Patient connected to nasal cannula oxygen  Post-op Assessment: Report given to RN, Post -op Vital signs reviewed and stable and Patient moving all extremities  Post vital signs: Reviewed and stable  Last Vitals:  Vitals Value Taken Time  BP 123/57 01/26/21 1341  Temp    Pulse 56 01/26/21 1341  Resp 19 01/26/21 1341  SpO2 100 % 01/26/21 1341  Vitals shown include unvalidated device data.  Last Pain:  Vitals:   01/26/21 0721  TempSrc: Oral  PainSc:          Complications: No complications documented.

## 2021-01-26 NOTE — H&P (Signed)
Addendum H&P  Patient presents today with adjacent segment degenerative disease at L5-S1.  Surgical plan is to move forward with an anterior lumbar interbody fusion L5-S1 with supplemental posterior fixation.  I have gone over the surgical procedure including the risks and benefits with the patient and she is expressed understanding of the procedure as well as willingness to move forward with the procedure.  There has been no change in her clinical exam since her last office visit of 01/17/2021.

## 2021-01-26 NOTE — Anesthesia Procedure Notes (Signed)
Procedure Name: Intubation Date/Time: 01/26/2021 8:45 AM Performed by: Moshe Salisbury, CRNA Pre-anesthesia Checklist: Patient identified, Emergency Drugs available, Suction available and Patient being monitored Patient Re-evaluated:Patient Re-evaluated prior to induction Oxygen Delivery Method: Circle System Utilized Preoxygenation: Pre-oxygenation with 100% oxygen Induction Type: IV induction Ventilation: Mask ventilation without difficulty Laryngoscope Size: Mac and 3 Grade View: Grade II Tube type: Oral Tube size: 7.5 mm Number of attempts: 1 Airway Equipment and Method: Stylet and Oral airway Placement Confirmation: ETT inserted through vocal cords under direct vision,  positive ETCO2 and breath sounds checked- equal and bilateral Secured at: 22 cm Tube secured with: Tape Dental Injury: Teeth and Oropharynx as per pre-operative assessment

## 2021-01-26 NOTE — Discharge Instructions (Signed)
  Spinal Fusion, Adult, Care After This sheet gives you information about how to care for yourself after your procedure. Your doctor may also give you more specific instructions. If you have problems or questions, contact your doctor. Follow these instructions at home: Medicines Take over-the-counter and prescription medicines only as told by your doctor. These include any medicines for pain or blood-thinning medicines (anticoagulants). If you were prescribed an antibiotic medicine, take it as told by your doctor. Do not stop taking the antibiotic even if you start to feel better. Do not drive for 24 hours if you were given a medicine to help you relax (sedative) during your procedure. Do not drive or use heavy machinery while taking prescription pain medicine. If you have a brace: Wear the brace as told by your doctor. Take it off only as told by your doctor. Keep the brace clean. Managing pain, stiffness, and swelling If directed, put ice on the surgery area: If you have a removable brace, take it off as told by your doctor. Put ice in a plastic bag. Place a towel between your skin and the bag. Leave the ice on for 20 minutes, 2-3 times a day. Surgery cut care    Follow instructions from your doctor about how to take care of your cut from surgery (incision). Make sure you: Wash your hands with soap and water before you change your bandage (dressing). If you cannot use soap and water, use hand sanitizer. Change your bandage as told by your doctor. Leave stitches (sutures), skin glue, or skin tape (adhesive) strips in place. They may need to stay in place for 2 weeks or longer. If tape strips get loose and curl up, you may trim the loose edges. Do not remove tape strips completely unless your doctor says it is okay. Keep your cut from surgery clean and dry. Do not take baths, swim, or use a hot tub until your doctor says it is okay. Ask your doctor if you can take showers. You may only  be allowed to take sponge baths. Every day, check your cut from surgery and the area around it for: More redness, swelling, or pain. Fluid or blood. Warmth. Pus or a bad smell. If you have a drain tube, follow instructions from your doctor about caring for it. Do not take out the drain tube or any bandages unless your doctor says it is okay. Physical activity Rest and protect your back as much as possible. Follow instructions from your doctor about how to move. Use good posture to help your spine heal. Do not lift anything that is heavier than 8 lb (3.6 kg), or the limit that you are told, until your doctor says that it is safe. Do not twist or bend at the waist until your doctor says it is okay. It is best if you: Do not make pushing and pulling motions. Do not sit or lie down in the same position for a long time. Do not raise your hands or arms above your head. Return to your normal activities as told by your doctor. Ask your doctor what activities are safe for you. Rest and protect your back as much as you can. Do not start to exercise until your doctor says it is okay. Ask your doctor what kinds of exercise you can do to make your back stronger. Ok to shower in 5 days.  Do not take a bath or submerge the wound General instructions To prevent blood clots and lessen swelling   in your legs: Wear compression stockings as told. Walk one or more times every few hours as told by your doctor. Do not use any products that contain nicotine or tobacco, such as cigarettes and e-cigarettes. These can delay bone healing. If you need help quitting, ask your doctor. To prevent or treat constipation while you are taking prescription pain medicine, your doctor may suggest that you: Drink enough fluid to keep your pee (urine) pale yellow. Take over-the-counter or prescription medicines. Eat foods that are high in fiber. These include fresh fruits and vegetables, whole grains, and beans. Limit foods that  are high in fat and processed sugars, such as fried and sweet foods. Keep all follow-up visits as told by your doctor. This is important. Contact a doctor if: Your pain gets worse. Your medicine does not help your pain. Your legs or feet get painful or swollen. Your cut from surgery is more red, swollen, or painful. Your cut from surgery feels warm to the touch. You have: Fluid or blood coming from your cut from surgery. Pus or a bad smell coming from your cut from surgery. A fever. Weakness or loss of feeling (numbness) in your legs that is new or getting worse. Trouble controlling when you pee (urinate) or poop (have a bowel movement). You feel sick to your stomach (nauseous). You throw up (vomit). Get help right away if: Your pain is very bad. You have chest pain. You have trouble breathing. You start to have a cough. These symptoms may be an emergency. Do not wait to see if the symptoms will go away. Get medical help right away. Call your local emergency services (911 in the [place] Do not drive yourself to the hospital. Summary After the procedure, it is common to have pain in your back and pain by your surgery cut(s). Icing and pain medicines may help to control the pain. Follow directions from your doctor. Rest and protect your back as much as possible. Do not twist or bend at the waist. Get up and walk one or more times every few hours as told by your doctor. This information is not intended to replace advice given to you by your health care provider. Make sure you discuss any questions you have with your health care provider.  -signs and symptoms of a blood clot such as chest pain; shortness of breath; pain, swelling, or warmth in the leg -signs and symptoms of a stroke such as changes in vision; confusion; trouble speaking or understanding; severe headaches; sudden numbness or weakness of the face, arm or leg; trouble walking; dizziness; loss of coordination Side effects that  usually do not require medical attention (report to your doctor or health care professional if they continue or are bothersome): -hair loss -pain, redness, or irritation at site where injected This list may not describe all possible side effects. Call your doctor for medical advice about side effects. You may report side effects to FDA at 1-800-FDA-1088. Where should I keep my medicine? Keep out of the reach of children. Store at room temperature between 15 and 30 degrees C (59 and 86 degrees F). Do not freeze. If your injections have been specially prepared, you may need to store them in the refrigerator. Ask your pharmacist. Throw away any unused medicine after the expiration date. NOTE: This sheet is a summary. It may not cover all possible information. If you have questions about this medicine, talk to your doctor, pharmacist, or health care provider.       What is this medicine? ENOXAPARIN (ee nox a PA rin) is used after knee, hip, or abdominal surgeries to prevent blood clotting. It is also used to treat existing blood clots in the lungs or in the veins. This medicine may be used for other purposes; ask your health care provider or pharmacist if you have questions. COMMON BRAND NAME(S): Lovenox What should I tell my health care provider before I take this medicine? They need to know if you have any of these conditions: bleeding disorders, hemorrhage, or hemophilia infection of the heart or heart valves kidney or liver disease previous stroke prosthetic heart valve recent surgery or delivery of a baby ulcer in the stomach or intestine, diverticulitis, or other bowel disease an unusual or allergic reaction to enoxaparin, heparin, pork or pork products, other medicines, foods, dyes, or preservatives pregnant or trying to get pregnant breast-feeding How should I use this medicine? This medicine is for injection under the skin. It is usually given by a health-care  professional. You or a family member may be trained on how to give the injections. If you are to give yourself injections, make sure you understand how to use the syringe, measure the dose if necessary, and give the injection. To avoid bruising, do not rub the site where this medicine has been injected. Do not take your medicine more often than directed. Do not stop taking except on the advice of your doctor or health care professional. Make sure you receive a puncture-resistant container to dispose of the needles and syringes once you have finished with them. Do not reuse these items. Return the container to your doctor or health care professional for proper disposal. Talk to your pediatrician regarding the use of this medicine in children. Special care may be needed. Overdosage: If you think you have taken too much of this medicine contact a poison control center or emergency room at once. NOTE: This medicine is only for you. Do not share this medicine with others. What if I miss a dose? If you miss a dose, take it as soon as you can. If it is almost time for your next dose, take only that dose. Do not take double or extra doses. What may interact with this medicine? aspirin and aspirin-like medicines certain medicines that treat or prevent blood clots dipyridamole NSAIDs, medicines for pain and inflammation, like ibuprofen or naproxen This list may not describe all possible interactions. Give your health care provider a list of all the medicines, herbs, non-prescription drugs, or dietary supplements you use. Also tell them if you smoke, drink alcohol, or use illegal drugs. Some items may interact with your medicine. What should I watch for while using this medicine? Visit your healthcare professional for regular checks on your progress. You may need blood work done while you are taking this medicine. Your condition will be monitored carefully while you are receiving this medicine. It is important  not to miss any appointments. If you are going to need surgery or other procedure, tell your healthcare professional that you are using this medicine. Using this medicine for a long time may weaken your bones and increase the risk of bone fractures. Avoid sports and activities that might cause injury while you are using this medicine. Severe falls or injuries can cause unseen bleeding. Be careful when using sharp tools or knives. Consider using an electric razor. Take special care brushing or flossing your teeth. Report any injuries, bruising, or red spots on the skin to your healthcare professional.   Wear a medical ID bracelet or chain. Carry a card that describes your disease and details of your medicine and dosage times. What side effects may I notice from receiving this medicine? Side effects that you should report to your doctor or health care professional as soon as possible: allergic reactions like skin rash, itching or hives, swelling of the face, lips, or tongue bone pain signs and symptoms of bleeding such as bloody or black, tarry stools; red or dark-brown urine; spitting up blood or brown material that looks like coffee grounds; red spots on the skin; unusual bruising or bleeding from the eye, gums, or nose signs and symptoms of a blood clot such as chest pain; shortness of breath; pain, swelling, or warmth in the leg signs and symptoms of a stroke such as changes in vision; confusion; trouble speaking or understanding; severe headaches; sudden numbness or weakness of the face, arm or leg; trouble walking; dizziness; loss of coordination Side effects that usually do not require medical attention (report to your doctor or health care professional if they continue or are bothersome): hair loss pain, redness, or irritation at site where injected This list may not describe all possible side effects. Call your doctor for medical advice about side effects. You may report side effects to FDA at  1-800-FDA-1088. Where should I keep my medicine? Keep out of the reach of children. Store at room temperature between 15 and 30 degrees C (59 and 86 degrees F). Do not freeze. If your injections have been specially prepared, you may need to store them in the refrigerator. Ask your pharmacist. Throw away any unused medicine after the expiration date. NOTE: This sheet is a summary. It may not cover all possible information. If you have questions about this medicine, talk to your doctor, pharmacist, or health care provider. 

## 2021-01-26 NOTE — OR Nursing (Signed)
Per protocol the radiologist called at 10:55 AM to confirm there were no foreign bodies retained in image.

## 2021-01-26 NOTE — Anesthesia Postprocedure Evaluation (Signed)
Anesthesia Post Note  Patient: [name]  Procedure(s) Performed: ANTERIOR LUMBAR FUSION LUMBAR FIVE-SACRAL ONE WITH POSTERIOR SPINAL FUSION INTERBODY (EXTENSION OF PREVIOUS FUSION) (N/A Spine Lumbar) ABDOMINAL EXPOSURE (N/A Abdomen)     Patient location during evaluation: PACU Anesthesia Type: Regional and General Level of consciousness: awake and alert, oriented and patient cooperative Pain management: pain level controlled Vital Signs Assessment: post-procedure vital signs reviewed and stable Respiratory status: spontaneous breathing, nonlabored ventilation and respiratory function stable Cardiovascular status: blood pressure returned to baseline and stable Postop Assessment: no apparent nausea or vomiting Anesthetic complications: no   No complications documented.  Last Vitals:  Vitals:   01/26/21 1341 01/26/21 1354  BP: (!) 123/57 111/64  Pulse: (!) 51 (!) 51  Resp: 18 (!) 21  Temp: 36.5 C   SpO2: 99% 99%    Last Pain:  Vitals:   01/26/21 1341  TempSrc:   PainSc: [place]

## 2021-01-26 NOTE — Op Note (Signed)
    OPERATIVE REPORT  DATE OF SURGERY: 01/26/2021  PATIENT: [name], 65 y.o. female MRN: [id number]  DOB: October 21, 1956  PRE-OPERATIVE DIAGNOSIS: Degenerative disc disease  POST-OPERATIVE DIAGNOSIS:  Same  PROCEDURE: Anterior exposure for L5-S1 disc fusion  SURGEON:  Curt Jews, M.D.  Co-surgeon for the exposure Dr. Rolena Infante  PHYSICIAN ASSISTANT: Marjean Donna, MD  The assistant was needed for exposure and to expedite the case  ANESTHESIA: General  EBL: per anesthesia record  Total I/O In: 2350 [I.V.:2000; IV Piggyback:350] Out: 295 [Urine:145; Blood:150]  BLOOD ADMINISTERED: none  DRAINS: none  SPECIMEN: none  COUNTS CORRECT:  YES  PATIENT DISPOSITION:  PACU - hemodynamically stable  PROCEDURE DETAILS: Patient was taken up and placed to position the area of the abdomen stringently sterile fashion.  C-arm was brought into the field to show the level of the L5-S1 disc.  This was marked.  Patient was then prepped and draped in usual sterile fashion.  Incision was made from the midline to the left and carried down through the subcutaneous fat.  The anterior rectus sheath was opened in line with the skin incision.  The rectus muscle was mobilized.  Retroperitoneal was entered bluntly in the left lower quadrant and the intraperitoneal contents were mobilized to the right.  The posterior sheath was opened laterally.  Continued mobilization was continued to the level of the disc.  Dissection was continued over the level of the iliac vessels.  This was continued down to the anterior disc space.  This was mobilized to proximal and distal.  The NuVasive retractor was brought onto the field.  A blade was positioned to the right and left of the L5-S1 disc and also was used for superior and inferior exposure.  Spinal needle was placed in the disc space and C-arm was brought back onto the firmed film to confirm that this was the appropriate level.  The remainder of the procedure will be  dictated as a separate note by Dr. Lynnea Ferrier, M.D., [facility] 01/26/2021 1:52 PM  Note: Portions of this report may have been transcribed using voice recognition software.  Every effort has been made to ensure accuracy; however, inadvertent computerized transcription errors may still be present.

## 2021-01-26 NOTE — Progress Notes (Signed)
Pharmacy Antibiotic Note  [name] is a 65 y.o. female admitted on 01/26/2021 with anterior lumbar interbody fusion L5-S1 with posterior fixation. Pharmacy has been consulted for vancomycin dosing for surgical prophylaxis. No drain in place.   Plan: Vancomycin 1000mg  x1      Temp (24hrs), Avg:97.8 F (36.6 C), Min:97.7 F (36.5 C), Max:98 F (36.7 C)  No results for input(s): WBC, CREATININE, LATICACIDVEN, VANCOTROUGH, VANCOPEAK, VANCORANDOM, GENTTROUGH, GENTPEAK, GENTRANDOM, TOBRATROUGH, TOBRAPEAK, TOBRARND, AMIKACINPEAK, AMIKACINTROU, AMIKACIN in the last 168 hours.  Estimated Creatinine Clearance: 54.5 mL/min (by C-G formula based on SCr of 0.85 mg/dL).    Allergies  Allergen Reactions  . Midazolam Hcl Other (See Comments)    HYPER  . Prednisone Other (See Comments)    HYPER, depressed, moody  . Statins Other (See Comments)    Causes muscle weakness and joint pain  . Ace Inhibitors Other (See Comments)    DECREASES HEARTRATE  . Amoxicillin Diarrhea    Did it involve swelling of the face/tongue/throat, SOB, or low BP? No Did it involve sudden or severe rash/hives, skin peeling, or any reaction on the inside of your mouth or nose? No Did you need to seek medical attention at a hospital or doctor's office? No When did it last happen?~2019 or so If all above answers are "NO", may proceed with cephalosporin use.   . Nsaids Other (See Comments)    AVOIDS; HX GASTRIC ULCER  . Sulfa Antibiotics Rash and Other (See Comments)    JOINT PAIN    Antimicrobials this admission: Vanc x1 preop Vanc x1 postop  Dose adjustments this admission: N/a  Microbiology results: N/a  Thank you for allowing pharmacy to be a part of this patient's care.   Cristela Felt, PharmD Clinical Pharmacist  01/26/2021 3:17 PM

## 2021-01-27 MED ORDER — GUAIFENESIN 100 MG/5ML PO SOLN
5.0000 mL | ORAL | Status: DC | PRN
  Administered 2021-01-27 – 2021-01-28 (×2): 100 mg via ORAL
  Filled 2021-01-27 (×3): qty 5

## 2021-01-27 MED ORDER — MAGNESIUM CITRATE PO SOLN
1.0000 | Freq: Once | ORAL | Status: AC
  Administered 2021-01-27: 1 via ORAL
  Filled 2021-01-27: qty 296

## 2021-01-27 MED ORDER — GUAIFENESIN-DM 100-10 MG/5ML PO SYRP
5.0000 mL | ORAL_SOLUTION | ORAL | Status: DC | PRN
  Filled 2021-01-27: qty 5

## 2021-01-27 NOTE — Progress Notes (Signed)
    Subjective: Procedure(s) (LRB): ANTERIOR LUMBAR FUSION LUMBAR FIVE-SACRAL ONE WITH POSTERIOR SPINAL FUSION INTERBODY (EXTENSION OF PREVIOUS FUSION) (N/A) ABDOMINAL EXPOSURE (N/A) 1 Day Post-Op  Patient reports pain as 3 on 0-10 scale.  Reports decreased leg pain reports incisional back pain   Positive void Negative bowel movement Positive flatus Negative chest pain or shortness of breath  Objective: Vital signs in last 24 hours: Temp:  [97.7 F (36.5 C)-99.1 F (37.3 C)] 98.3 F (36.8 C) (02/10 1158) Pulse Rate:  [51-87] 76 (02/10 1158) Resp:  [12-21] 16 (02/10 1158) BP: (99-147)/(42-65) 113/54 (02/10 1158) SpO2:  [91 %-99 %] 96 % (02/10 1158)  Intake/Output from previous day: 02/09 0701 - 02/10 0700 In: 8485 [P.O.:1320; I.V.:2000; IV Piggyback:550] Out: 295 [Urine:145; Blood:150]  Labs: Recent Labs    01/26/21 1540  WBC 24.3*  RBC 4.32  HCT 39.2  PLT 246   Recent Labs    01/26/21 1540  CREATININE 1.11*   No results for input(s): LABPT, INR in the last 72 hours.  Physical Exam: Neurologically intact ABD soft Intact pulses distally Dorsiflexion/Plantar flexion intact Incision: dressing C/D/I and no drainage Compartment soft There is no height or weight on file to calculate BMI.   Assessment/Plan: Patient stable  xrays n/a Continue mobilization with physical therapy Continue care  Advance diet Up with therapy Doppler pending.  Continue mobilization with therapy Plan on d/c to home tomorrow  Melina Schools, MD [facility] [phone]

## 2021-01-27 NOTE — Progress Notes (Addendum)
  Progress Note    01/27/2021 7:57 AM 1 Day Post-Op  Subjective:  Says she is sore.  Her feet itch  Tm 99.1  Vitals:   01/26/21 2348 01/27/21 0315  BP: (!) 117/53 (!) 121/58  Pulse: 83 85  Resp: 18 18  Temp: 98.3 F (36.8 C) 99.1 F (37.3 C)  SpO2: 94% 94%    Physical Exam: General:  Sitting on side of bed in no distress Lungs:  Non labored Incisions:  Honeycomb dressing in place-clean and dry Extremities:  Bilateral feet are warm and well perfused.  Abdomen:  + flatus  CBC    Component Value Date/Time   WBC 24.3 (H) 01/26/2021 1540   RBC 4.32 01/26/2021 1540   HGB 12.5 01/26/2021 1540   HGB 14.9 10/11/2015 0905   HCT 39.2 01/26/2021 1540   HCT 44.5 10/11/2015 0905   PLT 246 01/26/2021 1540   PLT 279 10/11/2015 0905   MCV 90.7 01/26/2021 1540   MCV 88.0 10/11/2015 0905   MCH 28.9 01/26/2021 1540   MCHC 31.9 01/26/2021 1540   RDW 13.8 01/26/2021 1540   RDW 13.0 10/11/2015 0905   LYMPHSABS 3.3 02/17/2020 0405   LYMPHSABS 1.6 10/11/2015 0905   MONOABS 1.4 (H) 02/17/2020 0405   MONOABS 0.8 10/11/2015 0905   EOSABS 0.4 02/17/2020 0405   EOSABS 0.3 10/11/2015 0905   BASOSABS 0.1 02/17/2020 0405   BASOSABS 0.1 10/11/2015 0905    BMET    Component Value Date/Time   NA 140 01/19/2021 1139   NA 142 10/11/2015 0905   K 3.6 01/19/2021 1139   K 3.4 (L) 10/11/2015 0905   CL 101 01/19/2021 1139   CO2 28 01/19/2021 1139   CO2 28 10/11/2015 0905   GLUCOSE 102 (H) 01/19/2021 1139   GLUCOSE 118 10/11/2015 0905   BUN 17 01/19/2021 1139   BUN 13.2 10/11/2015 0905   CREATININE 1.11 (H) 01/26/2021 1540   CREATININE 1.0 10/11/2015 0905   CALCIUM 9.8 01/19/2021 1139   CALCIUM 10.0 10/11/2015 0905   GFRNONAA 54 (L) 01/26/2021 1540   GFRAA >60 07/21/2020 0314    INR    Component Value Date/Time   INR 0.9 01/19/2021 1139     Intake/Output Summary (Last 24 hours) at 01/27/2021 0757 Last data filed at 01/27/2021 0400 Gross per 24 hour  Intake 3870 ml  Output  295 ml  Net 3575 ml     Assessment:  65 y.o. female is s/p:  Anterior exposure for L5-S1 disc fusion  1 Day Post-Op  Plan: -pt's feet warm and well perfused.   =-incision with honeycomb dressing  -passing flatus -DVT study negative -doing well from vascular standpoint. -disposition per Dr. Rolena Infante -follow up with [facility] prn   Leontine Locket, PA-C [facility] [phone] 01/27/2021 7:57 AM  VASCULAR STAFF ADDENDUM: I have independently interviewed and examined the patient. I agree with the above.  Looks great.  Incision / dressing clean and dry. 2+ DP BLE. Diet as tolerated. Mobilize per orthopaedics.  Yevonne Aline. Stanford Breed, MD [facility] Phone Number: [phone] 01/27/2021 9:17 AM

## 2021-01-27 NOTE — Evaluation (Signed)
Occupational Therapy Evaluation Patient Details Name: [name] MRN: [id number] DOB: [birth year] Today's Date: 01/27/2021    History of Present Illness 65 y.o. female presenting with L5-S1 DDD s/p ALIF on 2/9 by Dr. Donnetta Hutching. 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   Clinical Impression   PTA patient was living alone a mobile home with 5 +2 STE and was independent with ADLs/IADLs without AD. Patient currently presents slightly below baseline level of function demonstrating Min guard to supervision A overall for observed ADLs with use of RW. OT provided education on spinal precautions, home set-up to maximize safety and independence with self-care tasks, and acquisition/use of AE. Patient expressed verbal understanding. Patient also notes ability to don/doff aspen lumbar corset without external assist. Assessment limited 2/2 fatigue from pain meds. Patient would benefit from continued acute OT services to maximize safety and independence with self-care tasks in prep for safe d/c home. Recommendation for [facility] although patient may progress to no follow-up OT needs pending progress. Patient has a friend that can provide 24hr supervision/assist upon return home.    Follow Up Recommendations  Home health OT;Supervision/Assistance - 24 hour    Equipment Recommendations  None recommended by OT (Patient has necessary DME.)    Recommendations for Other Services       Precautions / Restrictions Precautions Precautions: Back;Fall Precaution Booklet Issued: Yes (comment) Precaution Comments: Patient able to recall 2/3 back precautions. Occasional cues required for adherence to back precautions during ADLs. Required Braces or Orthoses: Spinal Brace Spinal Brace: Lumbar corset Restrictions Weight Bearing Restrictions: No      Mobility Bed Mobility Overal bed mobility: Needs Assistance Bed Mobility: Sit to  Sidelying   Sidelying to sit: Supervision     Sit to sidelying: Supervision General bed mobility comments: Supervision A grossly for all parts of bed mobility. Occasional cues for log rolling technique.    Transfers Overall transfer level: Needs assistance Equipment used: Rolling walker (2 wheeled) Transfers: Sit to/from [facility] Sit to Stand: Min guard Stand pivot transfers: Min guard       General transfer comment: Min guard for sit to stand and stand-pivot transfers with use of RW. Cues for hand placement, proximity to RW, and walker management.    Balance Overall balance assessment: Mild deficits observed, not formally tested                                         ADL either performed or assessed with clinical judgement   ADL Overall ADL's : Needs assistance/impaired     Grooming: Supervision/safety;Standing Grooming Details (indicate cue type and reason): 3/3 grooming tasks standing at sink level with cues for walker management and safety. SOB noted with light activity.             Lower Body Dressing: Min guard;Sit to/from stand Lower Body Dressing Details (indicate cue type and reason): Patient able to thread BLE through underwear in sitting with good adherence to back precautions. Can hike LB clothing over hips in standing without UE support or LOB. Toilet Transfer: Designer, television/film set Details (indicate cue type and reason): Cues for walker management and hand placement. Toileting- Water quality scientist and Hygiene: Min guard;Sit to/from stand Toileting - Clothing Manipulation Details (indicate cue type and reason): Min guard for safety.     Functional mobility during  ADLs: Min guard;Rolling walker       Vision Baseline Vision/History: Wears glasses Patient Visual Report: No change from baseline       Perception     Praxis      Pertinent Vitals/Pain Pain Assessment: 0-10 Pain Score: 5  Pain Location:  Low back and abdomen (incision) Pain Descriptors / Indicators: Aching;Sore Pain Intervention(s): Limited activity within patient's tolerance;Monitored during session;Premedicated before session;Repositioned     Hand Dominance Left   Extremity/Trunk Assessment Upper Extremity Assessment Upper Extremity Assessment: Overall WFL for tasks assessed   Lower Extremity Assessment Lower Extremity Assessment: Defer to PT evaluation   Cervical / Trunk Assessment Cervical / Trunk Assessment: Other exceptions Cervical / Trunk Exceptions: s/p spinal surgery   Communication Communication Communication: No difficulties   Cognition Arousal/Alertness: Lethargic;Suspect due to medications Behavior During Therapy: [facility] for tasks assessed/performed Overall Cognitive Status: Within Functional Limits for tasks assessed                                 General Comments: Patient A&Ox4.   General Comments  Dressing at incision lightly soiled. RN aware.    Exercises     Shoulder Instructions      Home Living Family/patient expects to be discharged to:: Private residence Living Arrangements: Alone Available Help at Discharge: Friend(s);Available 24 hours/day Type of Home: Mobile home Home Access: Stairs to enter Entrance Stairs-Number of Steps: 5 steps +2 at proch Entrance Stairs-Rails: Right;Left Home Layout: One level     Bathroom Shower/Tub: Walk-in shower;Tub/shower unit   Bathroom Toilet: Standard Bathroom Accessibility: Yes   Home Equipment: Cane - single point;Shower seat;Walker - 2 wheels;Bedside commode;Walker - 4 wheels          Prior Functioning/Environment Level of Independence: Independent        Comments: No AD PTA. Reports fall 11/21. Mild unsteadiness on her feet recently.        OT Problem List: Impaired balance (sitting and/or standing);Pain      OT Treatment/Interventions: Self-care/ADL training;Therapeutic exercise;Energy conservation;DME  and/or AE instruction;Therapeutic activities;Patient/family education;Balance training    OT Goals(Current goals can be found in the care plan section) Acute Rehab OT Goals Patient Stated Goal: To return home. OT Goal Formulation: With patient Time For Goal Achievement: 02/10/21 Potential to Achieve Goals: Good ADL Goals Additional ADL Goal #1: Patient will complete A.M. ADLs with overall supervision A to Mod I with LRAD and good adherence to spinal precautions. Additional ADL Goal #2: Patient will recall 3/3 spinal precautions without cueing in prep for ADLs/IADLs.  OT Frequency: Min 2X/week   Barriers to D/C:            Co-evaluation              AM-PAC OT "6 Clicks" Daily Activity     Outcome Measure Help from another person eating meals?: None Help from another person taking care of personal grooming?: A Little Help from another person toileting, which includes using toliet, bedpan, or urinal?: A Little Help from another person bathing (including washing, rinsing, drying)?: A Little Help from another person to put on and taking off regular upper body clothing?: None Help from another person to put on and taking off regular lower body clothing?: A Little 6 Click Score: 20   End of Session Equipment Utilized During Treatment: Gait belt;Rolling walker;Back brace Nurse Communication: Mobility status  Activity Tolerance: Patient tolerated treatment well;Patient limited by lethargy Patient  left: in bed;with call bell/phone within reach  OT Visit Diagnosis: Unsteadiness on feet (R26.81);Muscle weakness (generalized) (M62.81);History of falling (Z91.81)                Time: 0740-0800 OT Time Calculation (min): 20 min Charges:  OT General Charges $OT Visit: 1 Visit OT Evaluation $OT Eval Moderate Complexity: 1 Mod  Nicole Hafley H. OTR/L Supplemental OT, Department of rehab services [phone]  Halia Franey R H. 01/27/2021, 9:40 AM

## 2021-01-27 NOTE — Evaluation (Signed)
Physical Therapy Evaluation Patient Details Name: [name] MRN: [id number] DOB: June 23, 1956 Today's Date: 01/27/2021   History of Present Illness  65 y.o. female presenting with L5-S1 DDD s/p ALIF on 2/9 by Dr. Donnetta Hutching. 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    Clinical Impression  Pt admitted with above diagnosis. At the time of PT eval, pt was able to demonstrate transfers and ambulation with gross min guard assist to supervision for safety. Pt initially required min assist on the stairs but progressed to min guard assist by end of stair training. Pt was educated on precautions, brace application/wearing schedule, appropriate activity progression, and car transfer. Pt currently with functional limitations due to the deficits listed below (see PT Problem List). Pt will benefit from skilled PT to increase their independence and safety with mobility to allow discharge to the venue listed below.      Follow Up Recommendations Home health PT;Supervision for mobility/OOB    Equipment Recommendations  None recommended by PT    Recommendations for Other Services       Precautions / Restrictions Precautions Precautions: Back;Fall Precaution Booklet Issued: Yes (comment) Precaution Comments: Reviewed handout and pt was cued for precautions during functional mobility. Required Braces or Orthoses: Spinal Brace Spinal Brace: Lumbar corset Restrictions Weight Bearing Restrictions: No      Mobility  Bed Mobility Overal bed mobility: Needs Assistance Bed Mobility: Sit to Sidelying;Sidelying to Sit   Sidelying to sit: Supervision     Sit to sidelying: Supervision General bed mobility comments: Increased time required however pt was able to complete with HOB flat and rails lowered to simulate home environment.    Transfers Overall transfer level: Needs assistance Equipment used: Rolling walker (2  wheeled) Transfers: Sit to/from Stand Sit to Stand: Min guard Stand pivot transfers: Min guard       General transfer comment: Increased time but able to power-up to full stand with proper hand placement on seated surface for safety.  Ambulation/Gait Ambulation/Gait assistance: Min guard Gait Distance (Feet): 150 Feet Assistive device: Rolling walker (2 wheeled) Gait Pattern/deviations: Step-through pattern;Decreased stride length;Trunk flexed Gait velocity: Decreased Gait velocity interpretation: <1.31 ft/sec, indicative of household ambulator General Gait Details: VC's for improved posture, cloer walker proximity, and forward gaze.  Stairs Stairs: Yes Stairs assistance: Min guard;Min assist Stair Management: Step to pattern;Two rails;Forwards Number of Stairs: 6 General stair comments: VC's for sequencing and general safety. Min assist progressing to min guard assist by end of stair training.  Wheelchair Mobility    Modified Rankin (Stroke Patients Only)       Balance Overall balance assessment: Mild deficits observed, not formally tested                                           Pertinent Vitals/Pain Pain Assessment: Faces Pain Score: 5  Faces Pain Scale: Hurts even more Pain Location: Low back and abdomen (incision) Pain Descriptors / Indicators: Aching;Sore;Operative site guarding Pain Intervention(s): Monitored during session;Limited activity within patient's tolerance;Repositioned    Home Living Family/patient expects to be discharged to:: Private residence Living Arrangements: Alone Available Help at Discharge: Friend(s);Available 24 hours/day Type of Home: Mobile home Home Access: Stairs to enter Entrance Stairs-Rails: Right;Left Entrance Stairs-Number of Steps: 5 steps +2 at [place]: One level Home Equipment: Cane - single point;Shower  seat;Walker - 2 wheels;Bedside commode;Walker - 4 wheels Additional Comments: friend was  present when pt had a fall at home. She has been staying with her    Prior Function Level of Independence: Independent         Comments: No AD PTA. Reports fall 11/21. Mild unsteadiness on her feet recently.     Hand Dominance   Dominant Hand: Left    Extremity/Trunk Assessment   Upper Extremity Assessment Upper Extremity Assessment: Defer to OT evaluation    Lower Extremity Assessment Lower Extremity Assessment: Generalized weakness (Consistent with pre-op diagnosis)    Cervical / Trunk Assessment Cervical / Trunk Assessment: Other exceptions Cervical / Trunk Exceptions: s/p spinal surgery  Communication   Communication: No difficulties  Cognition Arousal/Alertness: Lethargic;Suspect due to medications Behavior During Therapy: [facility] for tasks assessed/performed Overall Cognitive Status: Within Functional Limits for tasks assessed                                 General Comments: Patient A&Ox4.      General Comments General comments (skin integrity, edema, etc.): Dressing at incision lightly soiled. RN aware.    Exercises     Assessment/Plan    PT Assessment Patient needs continued PT services  PT Problem List Decreased strength;Decreased activity tolerance;Decreased balance;Decreased mobility;Decreased knowledge of use of DME;Decreased safety awareness;Decreased knowledge of precautions;Pain       PT Treatment Interventions DME instruction;Gait training;Stair training;Functional mobility training;Therapeutic activities;Therapeutic exercise;Neuromuscular re-education;Patient/family education    PT Goals (Current goals can be found in the Care Plan section)  Acute Rehab PT Goals Patient Stated Goal: To return home. PT Goal Formulation: With patient Time For Goal Achievement: 02/03/21 Potential to Achieve Goals: Good    Frequency Min 5X/week   Barriers to discharge        Co-evaluation               AM-PAC PT "6 Clicks" Mobility   Outcome Measure Help needed turning from your back to your side while in a flat bed without using bedrails?: None Help needed moving from lying on your back to sitting on the side of a flat bed without using bedrails?: A Little Help needed moving to and from a bed to a chair (including a wheelchair)?: A Little Help needed standing up from a chair using your arms (e.g., wheelchair or bedside chair)?: A Little Help needed to walk in hospital room?: A Little Help needed climbing 3-5 steps with a railing? : A Little 6 Click Score: 19    End of Session Equipment Utilized During Treatment: Gait belt;Back brace Activity Tolerance: Patient tolerated treatment well Patient left: in bed;with call bell/phone within reach Nurse Communication: Mobility status PT Visit Diagnosis: Unsteadiness on feet (R26.81);Pain;Muscle weakness (generalized) (M62.81) Pain - part of body:  (back/abdomen)    Time: 2119-4174 PT Time Calculation (min) (ACUTE ONLY): 25 min   Charges:   PT Evaluation $PT Eval Low Complexity: 1 Low PT Treatments $Gait Training: 8-22 mins        Rolinda Roan, PT, DPT Acute Rehabilitation Services Pager: [phone] Office: [phone]   Thelma Comp 01/27/2021, 11:06 AM

## 2021-01-28 ENCOUNTER — Ambulatory Visit: Payer: Self-pay | Admitting: Orthopedic Surgery

## 2021-01-28 NOTE — TOC Initial Note (Addendum)
Transition of Care [facility]) - Initial/Assessment Note    Patient Details  Name: [name] MRN: [id number] Date of Birth: November 28, 1956  Transition of [facility]) CM/SW Contact:    Joanne Chars, LCSW Phone Number: 01/28/2021, 9:04 AM  Clinical Narrative:  CSW met with pt to discuss recommendation for [facility].  Pt agreeable to this, choice document given, and pt chooses [facility].  Pt reports she lives alone but has a friend Terri who will be staying with her to assist after she is discharged.  Permission given to speak to Terri if needed.  PCP in place.  DME handled by [facility] staff.                  [phone]: Pt accepted by [facility]  Expected Discharge Plan: [place] Barriers to Discharge: No Barriers Identified   Patient Goals and CMS Choice Patient states their goals for this hospitalization and ongoing recovery are:: "get up, be able to do what I want to do" CMS Medicare.gov Compare Post Acute Care list provided to:: Patient Choice offered to / list presented to : Patient  Expected Discharge Plan and Services Expected Discharge Plan: [place] Choice: [place] arrangements for the past 2 months: Single Family Home Expected Discharge Date: 01/28/21               DME Arranged:  (DME handled by [facility] staff)                    Prior Living Arrangements/Services Living arrangements for the past 2 months: Single Family Home Lives with:: Self Patient language and need for interpreter reviewed:: Yes Do you feel safe going back to the place where you live?: Yes      Need for Family Participation in Patient Care: No (Comment) Care giver support system in place?: Yes (comment) Current home services: Other (comment) (none) Criminal Activity/Legal Involvement Pertinent to Current Situation/Hospitalization: No - Comment as needed  Activities of Daily Living Home Assistive Devices/Equipment:  Eyeglasses,CPAP ADL Screening (condition at time of admission) Patient's cognitive ability adequate to safely complete daily activities?: Yes Is the patient deaf or have difficulty hearing?: No Does the patient have difficulty seeing, even when wearing glasses/contacts?: No Does the patient have difficulty concentrating, remembering, or making decisions?: No Patient able to express need for assistance with ADLs?: Yes Does the patient have difficulty dressing or bathing?: Yes Independently performs ADLs?: No Communication: Independent Dressing (OT): Needs assistance Is this a change from baseline?: Change from baseline, expected to last <3days Grooming: Independent Feeding: Independent Bathing: Needs assistance Is this a change from baseline?: Change from baseline, expected to last <3 days Toileting: Needs assistance Is this a change from baseline?: Change from baseline, expected to last <3 days In/Out Bed: Needs assistance Is this a change from baseline?: Change from baseline, expected to last <3 days Does the patient have difficulty walking or climbing stairs?: Yes Weakness of Legs: Both Weakness of Arms/Hands: None  Permission Sought/Granted Permission sought to share information with : Family Supports Permission granted to share information with : Yes, Verbal Permission Granted  Share Information with NAME: Caroline Sauger, friend  Permission granted to share info w AGENCY: HH        Emotional Assessment Appearance:: Appears stated age Attitude/Demeanor/Rapport: Engaged Affect (typically observed): Appropriate,Pleasant Orientation: : Oriented to Self,Oriented to Place,Oriented to  Time,Oriented to Situation Alcohol /  Substance Use: Not Applicable Psych Involvement: No (comment)  Admission diagnosis:  S/P lumbar fusion [Z98.1] Patient Active Problem List   Diagnosis Date Noted  . S/P lumbar fusion 01/26/2021  . Encounter for orthopedic follow-up care 10/25/2020  . Pain in  left knee 08/17/2020  . Colitis presumed infectious 02/17/2020  . Lesion of liver 02/17/2020  . History of total knee arthroplasty 02/10/2020  . Osteoarthritis of right knee 10/30/2019  . Osteoarthritis of left knee 10/30/2019  . Internal derangement of left knee 07/07/2019  . Bilateral knee pain 03/21/2019  . Low back pain 11/27/2018  . Complication of surgical procedure 06/17/2018  . Degeneration of lumbar intervertebral disc 06/17/2018  . Degenerative spondylolisthesis 06/17/2018  . Acute coronary syndrome ([place]) 04/02/2018  . Hyperlipidemia 08/06/2017  . Influenza due to influenza A virus 01/01/2017  . Acute exacerbation of chronic obstructive airways disease ([place]) 01/01/2017  . Acute renal failure syndrome ([place]) 01/01/2017  . Dehydration 01/01/2017  . Preinfarction syndrome ([place]) 10/14/2016  . Chest pain 08/22/2016  . Supraventricular tachycardia ([place]) 08/22/2016  . Obstructive sleep apnea syndrome   . Hypertensive disorder   . Gastroesophageal reflux disease   . Depressive disorder   . Coronary arteriosclerosis   . Anxiety   . Pain in the chest   . Hypokalemia   . Carpal tunnel syndrome of left wrist 02/18/2016  . Leukocytosis 05/26/2014  . Tobacco user 05/26/2014  . Urge incontinence of urine 12/14/2011   PCP:  Maurice Small, MD Pharmacy:   [id number] [place], [place] [address]Phone: [phone]: [phone]    Social Determinants of Health (SDOH) Interventions    Readmission Risk Interventions No flowsheet data found.

## 2021-01-28 NOTE — Progress Notes (Signed)
Physical Therapy Treatment Patient Details Name: [name] MRN: [id number] DOB: 05-29-1956 Today's Date: 01/28/2021    History of Present Illness 65 y.o. female presenting with L5-S1 DDD s/p ALIF on 2/9 by Dr. Donnetta Hutching. 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    PT Comments    Pt progressing well with post-op mobility. She was able to demonstrate transfers and ambulation with gross supervision for safety to min guard assist and RW for support. Pt was educated on precautions, brace application/wearing schedule, appropriate activity progression, and car transfer. Will continue to follow.      Follow Up Recommendations  Home health PT;Supervision for mobility/OOB     Equipment Recommendations  None recommended by PT    Recommendations for Other Services       Precautions / Restrictions Precautions Precautions: Back;Fall Precaution Booklet Issued: Yes (comment) Precaution Comments: Patient able to recall 2/3 back precautions. Occasional cues required for adherence to back precautions during ADLs. Required Braces or Orthoses: Spinal Brace Spinal Brace: Lumbar corset Restrictions Weight Bearing Restrictions: No    Mobility  Bed Mobility Overal bed mobility: Needs Assistance Bed Mobility: Sit to Sidelying   Sidelying to sit: Supervision     Sit to sidelying: Supervision General bed mobility comments: Pt was received sitting up EOB    Transfers Overall transfer level: Needs assistance Equipment used: Rolling walker (2 wheeled) Transfers: Sit to/from [facility] Sit to Stand: Supervision Stand pivot transfers: Supervision       General transfer comment: VC's for hand placement on seated surface for safety. No assist to power up to full stand but supervision provided for safety.  Ambulation/Gait Ambulation/Gait assistance: Min guard;Supervision Gait Distance (Feet):  150 Feet Assistive device: Rolling walker (2 wheeled) Gait Pattern/deviations: Step-through pattern;Decreased stride length;Trunk flexed Gait velocity: Decreased Gait velocity interpretation: <1.31 ft/sec, indicative of household ambulator General Gait Details: VC's for improved posture, cloer walker proximity, and forward gaze.   Stairs Stairs: Yes Stairs assistance: Min guard Stair Management: Step to pattern;Two rails;Forwards Number of Stairs: 5 General stair comments: VC's for sequencing and general safety. Min guard assist throughout with no LE buckle noted.   Wheelchair Mobility    Modified Rankin (Stroke Patients Only)       Balance Overall balance assessment: Mild deficits observed, not formally tested                                          Cognition Arousal/Alertness: Awake/alert Behavior During Therapy: WFL for tasks assessed/performed Overall Cognitive Status: Within Functional Limits for tasks assessed                                        Exercises      General Comments        Pertinent Vitals/Pain Pain Assessment: 0-10 Pain Score: 4  Pain Location: Low back and abdomen (incision) Pain Descriptors / Indicators: Aching;Sore Pain Intervention(s): Monitored during session    Home Living                      Prior Function            PT Goals (current goals can now be found in the care plan section) Acute  Rehab PT Goals Patient Stated Goal: To return home. PT Goal Formulation: With patient Time For Goal Achievement: 02/03/21 Potential to Achieve Goals: Good Progress towards PT goals: Progressing toward goals    Frequency    Min 5X/week      PT Plan Current plan remains appropriate    Co-evaluation              AM-PAC PT "6 Clicks" Mobility   Outcome Measure  Help needed turning from your back to your side while in a flat bed without using bedrails?: None Help needed moving from  lying on your back to sitting on the side of a flat bed without using bedrails?: A Little Help needed moving to and from a bed to a chair (including a wheelchair)?: A Little Help needed standing up from a chair using your arms (e.g., wheelchair or bedside chair)?: A Little Help needed to walk in hospital room?: A Little Help needed climbing 3-5 steps with a railing? : A Little 6 Click Score: 19    End of Session Equipment Utilized During Treatment: Gait belt;Back brace Activity Tolerance: Patient tolerated treatment well Patient left: in bed;with call bell/phone within reach (EOB) Nurse Communication: Mobility status PT Visit Diagnosis: Unsteadiness on feet (R26.81);Pain;Muscle weakness (generalized) (M62.81) Pain - part of body:  (back/abdomen)     Time: 1103-1594 PT Time Calculation (min) (ACUTE ONLY): 14 min  Charges:  $Gait Training: 8-22 mins                     [name], PT, DPT Acute Rehabilitation Services Pager: [phone] Office: [phone]    [name] 01/28/2021, 12:19 PM

## 2021-01-28 NOTE — Progress Notes (Signed)
    Subjective: Procedure(s) (LRB): ANTERIOR LUMBAR FUSION LUMBAR FIVE-SACRAL ONE WITH POSTERIOR SPINAL FUSION INTERBODY (EXTENSION OF PREVIOUS FUSION) (N/A) ABDOMINAL EXPOSURE (N/A) 2 Days Post-Op  Patient reports pain as 3 on 0-10 scale.  Reports decreased leg pain reports incisional back pain   Positive void Positive bowel movement Positive flatus Negative chest pain or shortness of breath  Objective: Vital signs in last 24 hours: Temp:  [98 F (36.7 C)-98.6 F (37 C)] 98.2 F (36.8 C) (02/11 0441) Pulse Rate:  [52-76] 55 (02/11 0441) Resp:  [16-18] 18 (02/11 0441) BP: (95-151)/(42-58) 151/58 (02/11 0441) SpO2:  [91 %-96 %] 94 % (02/11 0441) Weight:  [69.6 kg] 69.6 kg (02/10 1316)  Intake/Output from previous day: 02/10 0701 - 02/11 0700 In: 115.4 [I.V.:115.4] Out: -   Labs: Recent Labs    01/26/21 1540  WBC 24.3*  RBC 4.32  HCT 39.2  PLT 246   Recent Labs    01/26/21 1540  CREATININE 1.11*   No results for input(s): LABPT, INR in the last 72 hours.  Physical Exam: Neurologically intact ABD soft Intact pulses distally Dorsiflexion/Plantar flexion intact Incision: dressing C/D/I and no drainage Compartment soft Body mass index is 30.98 kg/m.   Assessment/Plan: Patient stable  xrays n/a Continue mobilization with physical therapy Continue care  Advance diet Up with therapy  Patient is ambulating, and her pain is minimal.  Doppler exam was negative for DVT.  Overall patient is doing quite well.  Positive bowel movement with no significant abdominal pain.  Plan on discharge to home later this morning.  She will continue Lovenox as a DVT prevention for 10 days and then incision to aspirin.  Melina Schools, MD [facility] [phone]

## 2021-01-28 NOTE — Progress Notes (Signed)
Occupational Therapy Treatment Patient Details Name: [name] MRN: [id number] DOB: [birth year] Today's Date: 01/28/2021    History of present illness 65 y.o. female presenting with L5-S1 DDD s/p ALIF on 2/9 by Dr. Donnetta Hutching. 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   OT comments  OT treatment session with focus on self-care re-education, ADL transfers, and safety in prep for d/c home. Patient making progress toward goals demonstrating observed ADLs with supervision A overall this date. Patient continues to be limited by mild balance deficits indicating increased fall risk. Recommendation for HHOT continues to be appropriate. OT will continue to follow acutely.    Follow Up Recommendations  Home health OT;Supervision/Assistance - 24 hour    Equipment Recommendations  None recommended by OT (Patient has necessary DME.)    Recommendations for Other Services      Precautions / Restrictions Precautions Precautions: Back;Fall Precaution Booklet Issued: Yes (comment) Precaution Comments: Patient able to recall 2/3 back precautions. Occasional cues required for adherence to back precautions during ADLs. Required Braces or Orthoses: Spinal Brace Spinal Brace: Lumbar corset Restrictions Weight Bearing Restrictions: No       Mobility Bed Mobility Overal bed mobility: Needs Assistance Bed Mobility: Sit to Sidelying   Sidelying to sit: Supervision     Sit to sidelying: Supervision General bed mobility comments: Supervision A grossly for all parts of bed mobility. Occasional cues for log rolling technique.  Transfers Overall transfer level: Needs assistance Equipment used: Rolling walker (2 wheeled) Transfers: Sit to/from [facility] Sit to Stand: Supervision Stand pivot transfers: Supervision       General transfer comment: Supervision A for sit to stand and stand-pivot  transfers with use of RW. Cues for hand placement, proximity to RW, and walker management.    Balance Overall balance assessment: Mild deficits observed, not formally tested                                         ADL either performed or assessed with clinical judgement   ADL Overall ADL's : Needs assistance/impaired     Grooming: Supervision/safety;Standing Grooming Details (indicate cue type and reason): Hand hygiene standing at sink level.             Lower Body Dressing: Supervision/safety;Sit to/from stand Lower Body Dressing Details (indicate cue type and reason): Patient able to thread BLE through underwear in sitting with good adherence to back precautions. Can hike LB clothing over hips in standing without UE support or LOB. Supervision A for safety. Toilet Transfer: Consulting civil engineer Details (indicate cue type and reason): Cues for walker management and hand placement. Toileting- Clothing Manipulation and Hygiene: Supervision/safety;Sit to/from stand Toileting - Clothing Manipulation Details (indicate cue type and reason): Supervision A for safety.     Functional mobility during ADLs: Supervision/safety;Rolling walker General ADL Comments: Short-distance functional mobility in room with use of RW and supervision A for safety. Cues for walker management.     Vision       Perception     Praxis      Cognition Arousal/Alertness: Awake/alert Behavior During Therapy: WFL for tasks assessed/performed Overall Cognitive Status: Within Functional Limits for tasks assessed  Exercises     Shoulder Instructions       General Comments      Pertinent Vitals/ Pain       Pain Assessment: 0-10 Pain Score: 5  Pain Location: Low back and abdomen (incision) Pain Descriptors / Indicators: Aching;Sore Pain Intervention(s): Monitored during session;Premedicated before  session;Repositioned;Limited activity within patient's tolerance  Home Living                                          Prior Functioning/Environment              Frequency  Min 2X/week        Progress Toward Goals  OT Goals(current goals can now be found in the care plan section)  Progress towards OT goals: Progressing toward goals  Acute Rehab OT Goals Patient Stated Goal: To return home. OT Goal Formulation: With patient Time For Goal Achievement: 02/10/21 Potential to Achieve Goals: Good ADL Goals Additional ADL Goal #1: Patient will complete A.M. ADLs with overall supervision A to Mod I with LRAD and good adherence to spinal precautions. Additional ADL Goal #2: Patient will recall 3/3 spinal precautions without cueing in prep for ADLs/IADLs.  Plan Discharge plan remains appropriate;Frequency remains appropriate    Co-evaluation                 AM-PAC OT "6 Clicks" Daily Activity     Outcome Measure   Help from another person eating meals?: None Help from another person taking care of personal grooming?: A Little Help from another person toileting, which includes using toliet, bedpan, or urinal?: A Little Help from another person bathing (including washing, rinsing, drying)?: A Little Help from another person to put on and taking off regular upper body clothing?: None Help from another person to put on and taking off regular lower body clothing?: A Little 6 Click Score: 20    End of Session Equipment Utilized During Treatment: Gait belt;Rolling walker;Back brace  OT Visit Diagnosis: Unsteadiness on feet (R26.81);Muscle weakness (generalized) (M62.81);History of falling (Z91.81)   Activity Tolerance Patient tolerated treatment well;Patient limited by lethargy   Patient Left in bed;with call bell/phone within reach   Nurse Communication Mobility status        Time: 4627-0350 OT Time Calculation (min): 15 min  Charges: OT  General Charges $OT Visit: 1 Visit OT Treatments $Self Care/Home Management : 8-22 mins  Joselinne Lawal H. OTR/L Supplemental OT, Department of rehab services [phone]   Rama Sorci R H. 01/28/2021, 9:29 AM

## 2021-01-28 NOTE — Progress Notes (Signed)
Patient is discharged from room 3C05 at this time. Alert and in stable condition. IV site d/c'd and instructions read to patient with understanding verbalized and all questions answered. Left unit via wheelchair with all belongings at side.  

## 2021-01-29 DIAGNOSIS — Z961 Presence of intraocular lens: Secondary | ICD-10-CM | POA: Diagnosis not present

## 2021-01-29 DIAGNOSIS — M1990 Unspecified osteoarthritis, unspecified site: Secondary | ICD-10-CM | POA: Diagnosis not present

## 2021-01-29 DIAGNOSIS — N3941 Urge incontinence: Secondary | ICD-10-CM | POA: Diagnosis not present

## 2021-01-29 DIAGNOSIS — Z981 Arthrodesis status: Secondary | ICD-10-CM | POA: Diagnosis not present

## 2021-01-29 DIAGNOSIS — E785 Hyperlipidemia, unspecified: Secondary | ICD-10-CM | POA: Diagnosis not present

## 2021-01-29 DIAGNOSIS — F32A Depression, unspecified: Secondary | ICD-10-CM | POA: Diagnosis not present

## 2021-01-29 DIAGNOSIS — K589 Irritable bowel syndrome without diarrhea: Secondary | ICD-10-CM | POA: Diagnosis not present

## 2021-01-29 DIAGNOSIS — R011 Cardiac murmur, unspecified: Secondary | ICD-10-CM | POA: Diagnosis not present

## 2021-01-29 DIAGNOSIS — M797 Fibromyalgia: Secondary | ICD-10-CM | POA: Diagnosis not present

## 2021-01-29 DIAGNOSIS — R350 Frequency of micturition: Secondary | ICD-10-CM | POA: Diagnosis not present

## 2021-01-29 DIAGNOSIS — I10 Essential (primary) hypertension: Secondary | ICD-10-CM | POA: Diagnosis not present

## 2021-01-29 DIAGNOSIS — M5010 Cervical disc disorder with radiculopathy, unspecified cervical region: Secondary | ICD-10-CM | POA: Diagnosis not present

## 2021-01-29 DIAGNOSIS — E876 Hypokalemia: Secondary | ICD-10-CM | POA: Diagnosis not present

## 2021-01-29 DIAGNOSIS — I25119 Atherosclerotic heart disease of native coronary artery with unspecified angina pectoris: Secondary | ICD-10-CM | POA: Diagnosis not present

## 2021-01-29 DIAGNOSIS — M4310 Spondylolisthesis, site unspecified: Secondary | ICD-10-CM | POA: Diagnosis not present

## 2021-01-29 DIAGNOSIS — F17210 Nicotine dependence, cigarettes, uncomplicated: Secondary | ICD-10-CM | POA: Diagnosis not present

## 2021-01-29 DIAGNOSIS — Z4789 Encounter for other orthopedic aftercare: Secondary | ICD-10-CM | POA: Diagnosis not present

## 2021-01-29 DIAGNOSIS — I471 Supraventricular tachycardia: Secondary | ICD-10-CM | POA: Diagnosis not present

## 2021-01-29 DIAGNOSIS — F4000 Agoraphobia, unspecified: Secondary | ICD-10-CM | POA: Diagnosis not present

## 2021-01-29 DIAGNOSIS — G5602 Carpal tunnel syndrome, left upper limb: Secondary | ICD-10-CM | POA: Diagnosis not present

## 2021-01-29 DIAGNOSIS — M5136 Other intervertebral disc degeneration, lumbar region: Secondary | ICD-10-CM | POA: Diagnosis not present

## 2021-01-29 DIAGNOSIS — J449 Chronic obstructive pulmonary disease, unspecified: Secondary | ICD-10-CM | POA: Diagnosis not present

## 2021-01-29 DIAGNOSIS — Z9049 Acquired absence of other specified parts of digestive tract: Secondary | ICD-10-CM | POA: Diagnosis not present

## 2021-01-29 DIAGNOSIS — K219 Gastro-esophageal reflux disease without esophagitis: Secondary | ICD-10-CM | POA: Diagnosis not present

## 2021-01-29 DIAGNOSIS — F419 Anxiety disorder, unspecified: Secondary | ICD-10-CM | POA: Diagnosis not present

## 2021-01-31 ENCOUNTER — Encounter (HOSPITAL_COMMUNITY): Payer: Self-pay | Admitting: Orthopedic Surgery

## 2021-01-31 MED FILL — Heparin Sodium (Porcine) Inj 1000 Unit/ML: INTRAMUSCULAR | Qty: 30 | Status: AC

## 2021-01-31 MED FILL — Sodium Chloride IV Soln 0.9%: INTRAVENOUS | Qty: 1000 | Status: AC

## 2021-02-02 DIAGNOSIS — Z4789 Encounter for other orthopedic aftercare: Secondary | ICD-10-CM | POA: Diagnosis not present

## 2021-02-02 DIAGNOSIS — M5136 Other intervertebral disc degeneration, lumbar region: Secondary | ICD-10-CM | POA: Diagnosis not present

## 2021-02-02 DIAGNOSIS — M4310 Spondylolisthesis, site unspecified: Secondary | ICD-10-CM | POA: Diagnosis not present

## 2021-02-02 DIAGNOSIS — M5010 Cervical disc disorder with radiculopathy, unspecified cervical region: Secondary | ICD-10-CM | POA: Diagnosis not present

## 2021-02-02 DIAGNOSIS — M797 Fibromyalgia: Secondary | ICD-10-CM | POA: Diagnosis not present

## 2021-02-02 DIAGNOSIS — M1990 Unspecified osteoarthritis, unspecified site: Secondary | ICD-10-CM | POA: Diagnosis not present

## 2021-02-03 DIAGNOSIS — M4310 Spondylolisthesis, site unspecified: Secondary | ICD-10-CM | POA: Diagnosis not present

## 2021-02-03 DIAGNOSIS — M5010 Cervical disc disorder with radiculopathy, unspecified cervical region: Secondary | ICD-10-CM | POA: Diagnosis not present

## 2021-02-03 DIAGNOSIS — M1990 Unspecified osteoarthritis, unspecified site: Secondary | ICD-10-CM | POA: Diagnosis not present

## 2021-02-03 DIAGNOSIS — M5136 Other intervertebral disc degeneration, lumbar region: Secondary | ICD-10-CM | POA: Diagnosis not present

## 2021-02-03 DIAGNOSIS — Z4789 Encounter for other orthopedic aftercare: Secondary | ICD-10-CM | POA: Diagnosis not present

## 2021-02-03 DIAGNOSIS — M797 Fibromyalgia: Secondary | ICD-10-CM | POA: Diagnosis not present

## 2021-02-04 DIAGNOSIS — M4310 Spondylolisthesis, site unspecified: Secondary | ICD-10-CM | POA: Diagnosis not present

## 2021-02-04 DIAGNOSIS — M5136 Other intervertebral disc degeneration, lumbar region: Secondary | ICD-10-CM | POA: Diagnosis not present

## 2021-02-04 DIAGNOSIS — M1990 Unspecified osteoarthritis, unspecified site: Secondary | ICD-10-CM | POA: Diagnosis not present

## 2021-02-04 DIAGNOSIS — Z4789 Encounter for other orthopedic aftercare: Secondary | ICD-10-CM | POA: Diagnosis not present

## 2021-02-04 DIAGNOSIS — M5010 Cervical disc disorder with radiculopathy, unspecified cervical region: Secondary | ICD-10-CM | POA: Diagnosis not present

## 2021-02-04 DIAGNOSIS — M797 Fibromyalgia: Secondary | ICD-10-CM | POA: Diagnosis not present

## 2021-02-07 DIAGNOSIS — M5136 Other intervertebral disc degeneration, lumbar region: Secondary | ICD-10-CM | POA: Diagnosis not present

## 2021-02-07 DIAGNOSIS — M1990 Unspecified osteoarthritis, unspecified site: Secondary | ICD-10-CM | POA: Diagnosis not present

## 2021-02-07 DIAGNOSIS — Z4789 Encounter for other orthopedic aftercare: Secondary | ICD-10-CM | POA: Diagnosis not present

## 2021-02-07 DIAGNOSIS — M5010 Cervical disc disorder with radiculopathy, unspecified cervical region: Secondary | ICD-10-CM | POA: Diagnosis not present

## 2021-02-07 DIAGNOSIS — M797 Fibromyalgia: Secondary | ICD-10-CM | POA: Diagnosis not present

## 2021-02-07 DIAGNOSIS — M4310 Spondylolisthesis, site unspecified: Secondary | ICD-10-CM | POA: Diagnosis not present

## 2021-02-07 NOTE — Discharge Summary (Signed)
Patient ID: [name] MRN: [id number] DOB/AGE: 08-25-1956 65 y.o.  Admit date: 01/26/2021 Discharge date: 02/07/2021  Admission Diagnoses:  Active Problems:   S/P lumbar fusion   Discharge Diagnoses:  Active Problems:   S/P lumbar fusion  status post Procedure(s): ANTERIOR LUMBAR FUSION LUMBAR FIVE-SACRAL ONE WITH POSTERIOR SPINAL FUSION INTERBODY (EXTENSION OF PREVIOUS FUSION) ABDOMINAL EXPOSURE  Past Medical History:  Diagnosis Date  . Acute coronary syndrome ([place])   . Acute exacerbation of chronic obstructive airways disease ([place])   . Acute renal failure syndrome ([place])   . Agoraphobia without history of panic disorder   . Anginal pain ([place])   . Anxiety   . Arthritis    "qwhere" (04/03/2018)  . Bilateral carpal tunnel syndrome 02/18/2016  . Bilateral knee pain   . Carpal tunnel syndrome of left wrist   . Chest pain   . Childhood asthma   . Chronic bronchitis ([place])   . Chronic lower back pain   . Colitis presumed infectious   . COPD (chronic obstructive pulmonary disease) ([place])   . Coronary artery disease CARDIOLOGIST- DR  Doylene Canard  (VISIT 03-30-11 W/ CHART)   NON-OBS. CAD   (STRESS TEST NOV. 2011  . Depression   . DJD (degenerative joint disease)    JOINT PAIN  . Dyspnea    "anytime but mostly on exertion and smoking"  . Dysrhythmia 2017   SVT  . Elevated liver enzymes   . Fibromyalgia   . Frequency of urination   . GERD (gastroesophageal reflux disease)     W/ NEXIUM  . Heart murmur   . Hemorrhoids   . High cholesterol   . History of blood transfusion    "related to anemia" (04/03/2018)  . History of carpal tunnel syndrome    Right  . History of gastric ulcer 2004  . Hyperlipidemia   . Hypertension   . IBS (irritable bowel syndrome)   . Incisional pain s/p interstim implant 1st stage--- 12-07-11    left upper buttock-- pt states dressing clean dry and intact (on 12-08-11)  . Influenza due to influenza A virus   . Insomnia   . Internal derangement  of left knee   . Iron deficiency anemia 2004  . Lesion of liver   . Leukocytosis   . Lumbar pain   . Nocturia   . Non-productive cough   . Numbness in both hands AT TIMES  . OSA (obstructive sleep apnea)    "suppose to wear mask; I don't" (04/03/2018)  . Osteoarthritis of left knee   . PONV (postoperative nausea and vomiting)   . Post laminectomy syndrome   . Preinfarction syndrome ([place])   . Supraventricular tachycardia ([place])   . SVT (supraventricular tachycardia) ([place])   . Tobacco user   . Urge urinary incontinence     Surgeries: Procedure(s): ANTERIOR LUMBAR FUSION LUMBAR FIVE-SACRAL ONE WITH POSTERIOR SPINAL FUSION INTERBODY (EXTENSION OF PREVIOUS FUSION) ABDOMINAL EXPOSURE on 01/26/2021   Consultants: Treatment Team:  Rosetta Posner, MD  Discharged Condition: Improved  Hospital Course: [name] is an 65 y.o. female who was admitted 01/26/2021 for operative treatment of Spinal tenosis with neurogenic claudication, spondylolisthesis. Patient failed conservative treatments (please see the history and physical for the specifics) and had severe unremitting pain that affects sleep, daily activities and work/hobbies. After pre-op clearance, the patient was taken to the operating room on 01/26/2021 and underwent  Procedure(s): ANTERIOR LUMBAR FUSION LUMBAR FIVE-SACRAL ONE WITH POSTERIOR SPINAL FUSION INTERBODY (EXTENSION OF  PREVIOUS FUSION) ABDOMINAL EXPOSURE.    Patient was given perioperative antibiotics:  Anti-infectives (From admission, onward)   Start     Dose/Rate Route Frequency Ordered Stop   01/27/21 0100  vancomycin (VANCOREADY) IVPB 1000 mg/200 mL        1,000 mg 200 mL/hr over 60 Minutes Intravenous  Once 01/26/21 1522 01/27/21 0944   01/26/21 0641  vancomycin (VANCOCIN) IVPB 1000 mg/200 mL premix        1,000 mg 200 mL/hr over 60 Minutes Intravenous 60 min pre-op 01/26/21 0641 01/26/21 0841       Patient was given sequential compression devices and early ambulation  to prevent DVT.   Patient benefited maximally from hospital stay and there were no complications. At the time of discharge, the patient was urinating/moving their bowels without difficulty, tolerating a regular diet, pain is controlled with oral pain medications and they have been cleared by PT/OT.   Recent vital signs: No data found.   Recent laboratory studies: No results for input(s): WBC, HGB, HCT, PLT, NA, K, CL, CO2, BUN, CREATININE, GLUCOSE, INR, CALCIUM in the last 72 hours.  Invalid input(s): PT, 2   Discharge Medications:   Allergies as of 01/28/2021      Reactions   Midazolam Hcl Other (See Comments)   HYPER   Prednisone Other (See Comments)   HYPER, depressed, moody   Statins Other (See Comments)   Causes muscle weakness and joint pain   Ace Inhibitors Other (See Comments)   DECREASES HEARTRATE   Amoxicillin Diarrhea   Did it involve swelling of the face/tongue/throat, SOB, or low BP? No Did it involve sudden or severe rash/hives, skin peeling, or any reaction on the inside of your mouth or nose? No Did you need to seek medical attention at a hospital or doctor's office? No When did it last happen?~2019 or so If all above answers are "NO", may proceed with cephalosporin use.   Nsaids Other (See Comments)   AVOIDS; HX GASTRIC ULCER   Sulfa Antibiotics Rash, Other (See Comments)   JOINT PAIN      Medication List    STOP taking these medications   acetaminophen 500 MG tablet Commonly known as: TYLENOL   aspirin 81 MG chewable tablet   Lidocaine-Benzalkonium 4-0.13 % Liqd   Melatonin 10 MG Caps     TAKE these medications   albuterol 108 (90 Base) MCG/ACT inhaler Commonly known as: VENTOLIN HFA Inhale 2 puffs into the lungs every 6 (six) hours as needed for wheezing or shortness of breath.   ANTI-DIARRHEAL PLUS ANTI-GAS PO Take 2 tablets by mouth daily as needed (gas and diarrhea).   clonazePAM 1 MG tablet Commonly known as: KLONOPIN Take 1 tablet (1  mg total) by mouth at bedtime.   dextromethorphan-guaiFENesin 10-100 MG/5ML liquid Commonly known as: ROBITUSSIN-DM Take 10 mLs by mouth every 4 (four) hours as needed for cough.   diltiazem 120 MG 24 hr capsule Commonly known as: TIAZAC Take 120 mg by mouth daily.   DULoxetine 60 MG capsule Commonly known as: CYMBALTA Take 60 mg by mouth at bedtime.   enoxaparin 40 MG/0.4ML injection Commonly known as: LOVENOX Inject 0.4 mLs (40 mg total) into the skin daily for 10 days. 10 day supply 1 injection per day   gabapentin 100 MG capsule Commonly known as: NEURONTIN Take 100 mg by mouth 2 (two) times daily.   hydrALAZINE 25 MG tablet Commonly known as: APRESOLINE Take 25 mg by mouth daily. Additional 25 mg  if needed for high pressure   metoprolol tartrate 25 MG tablet Commonly known as: LOPRESSOR Take 0.5 tablets (12.5 mg total) by mouth 2 (two) times daily.   nitroGLYCERIN 0.4 MG SL tablet Commonly known as: NITROSTAT Place 1 tablet (0.4 mg total) under the tongue every 5 (five) minutes x 3 doses as needed for chest pain.   ondansetron 4 MG tablet Commonly known as: Zofran Take 1 tablet (4 mg total) by mouth every 8 (eight) hours as needed for nausea or vomiting.   pantoprazole 40 MG tablet Commonly known as: PROTONIX Take 1 tablet (40 mg total) by mouth 2 (two) times daily. What changed: when to take this   Vitamin D (Ergocalciferol) 1.25 MG (50000 UNIT) Caps capsule Commonly known as: DRISDOL Take 50,000 Units by mouth every Wednesday.     ASK your doctor about these medications   methocarbamol 500 MG tablet Commonly known as: Robaxin Take 1 tablet (500 mg total) by mouth every 8 (eight) hours as needed for up to 5 days for muscle spasms. Ask about: Should I take this medication?   oxyCODONE-acetaminophen 10-325 MG tablet Commonly known as: Percocet Take 1 tablet by mouth every 6 (six) hours as needed for up to 5 days for pain. Ask about: Should I take this  medication?       Diagnostic Studies: DG Chest 2 View  Result Date: 01/19/2021 CLINICAL DATA:  Preoperative, L5-S1 ALIF EXAM: CHEST - 2 VIEW COMPARISON:  08/02/2019 FINDINGS: The heart size and mediastinal contours are within normal limits. Both lungs are clear. Disc degenerative disease of the thoracic spine. Anterior cervical discectomy and fusion. IMPRESSION: No active cardiopulmonary disease. Electronically Signed   By: Eddie Candle M.D.   On: 01/19/2021 16:43   DG Lumbar Spine 2-3 Views  Result Date: 01/26/2021 CLINICAL DATA:  L5-S1 ALIF EXAM: DG C-ARM 1-60 MIN; LUMBAR SPINE - 2-3 VIEW FLUOROSCOPY TIME:  Fluoroscopy Time:  2 minutes and 7 seconds. Reported radiation: 82.78 mGy. COMPARISON:  CT October 18, 2020. FINDINGS: Two C-arm fluoroscopic images were obtained intraoperatively and submitted for post operative interpretation. These images demonstrate extension of prior L3-L5 posterior interbody fusion to S1 with bilateral rods and pedicle screws and placement of an interbody cage at L5-S1 with S1 vertebral body screw. Please see the performing provider's procedural report for further detail. IMPRESSION: Intraoperative fluoroscopic imaging, as detailed above. Electronically Signed   By: Margaretha Sheffield MD   On: 01/26/2021 13:12   DG C-Arm 1-60 Min  Result Date: 01/26/2021 CLINICAL DATA:  L5-S1 ALIF EXAM: DG C-ARM 1-60 MIN; LUMBAR SPINE - 2-3 VIEW FLUOROSCOPY TIME:  Fluoroscopy Time:  2 minutes and 7 seconds. Reported radiation: 82.78 mGy. COMPARISON:  CT October 18, 2020. FINDINGS: Two C-arm fluoroscopic images were obtained intraoperatively and submitted for post operative interpretation. These images demonstrate extension of prior L3-L5 posterior interbody fusion to S1 with bilateral rods and pedicle screws and placement of an interbody cage at L5-S1 with S1 vertebral body screw. Please see the performing provider's procedural report for further detail. IMPRESSION: Intraoperative fluoroscopic  imaging, as detailed above. Electronically Signed   By: Margaretha Sheffield MD   On: 01/26/2021 13:12   VAS [name] LOWER EXTREMITY VENOUS (DVT)  Result Date: 01/26/2021  Lower Venous DVT Study Indications: Recent back surgery - r/o DVT.  Comparison Study: No prior exams. Performing Technologist: Rogelia Rohrer  Examination Guidelines: A complete evaluation includes B-mode imaging, spectral Doppler, color Doppler, and power Doppler as needed of all accessible  portions of each vessel. Bilateral testing is considered an integral part of a complete examination. Limited examinations for reoccurring indications may be performed as noted. The reflux portion of the exam is performed with the patient in reverse Trendelenburg.  +---------+---------------+---------+-----------+----------+--------------+ RIGHT    CompressibilityPhasicitySpontaneityPropertiesThrombus Aging +---------+---------------+---------+-----------+----------+--------------+ CFV      Full           Yes      Yes                                 +---------+---------------+---------+-----------+----------+--------------+ SFJ      Full                                                        +---------+---------------+---------+-----------+----------+--------------+ FV Prox  Full           Yes      Yes                                 +---------+---------------+---------+-----------+----------+--------------+ FV Mid   Full           Yes      Yes                                 +---------+---------------+---------+-----------+----------+--------------+ FV DistalFull           Yes      Yes                                 +---------+---------------+---------+-----------+----------+--------------+ PFV      Full                                                        +---------+---------------+---------+-----------+----------+--------------+ POP      Full           Yes      Yes                                  +---------+---------------+---------+-----------+----------+--------------+ PTV      Full                                                        +---------+---------------+---------+-----------+----------+--------------+ PERO     Full                                                        +---------+---------------+---------+-----------+----------+--------------+   +---------+---------------+---------+-----------+----------+--------------+ LEFT     CompressibilityPhasicitySpontaneityPropertiesThrombus Aging +---------+---------------+---------+-----------+----------+--------------+ CFV      Full  Yes      Yes                                 +---------+---------------+---------+-----------+----------+--------------+ SFJ      Full                                                        +---------+---------------+---------+-----------+----------+--------------+ FV Prox  Full           Yes      Yes                                 +---------+---------------+---------+-----------+----------+--------------+ FV Mid   Full           Yes      Yes                                 +---------+---------------+---------+-----------+----------+--------------+ FV DistalFull           Yes      Yes                                 +---------+---------------+---------+-----------+----------+--------------+ PFV      Full                                                        +---------+---------------+---------+-----------+----------+--------------+ POP      Full           Yes      Yes                                 +---------+---------------+---------+-----------+----------+--------------+ PTV      Full                                                        +---------+---------------+---------+-----------+----------+--------------+ PERO     Full                                                         +---------+---------------+---------+-----------+----------+--------------+     Summary: BILATERAL: - No evidence of deep vein thrombosis seen in the lower extremities, bilaterally. - No evidence of superficial venous thrombosis in the lower extremities, bilaterally. -No evidence of popliteal cyst, bilaterally.   *See table(s) above for measurements and observations. Electronically signed by Monica Martinez MD on 01/26/2021 at 5:43:46 PM.    Final    DG OR LOCAL ABDOMEN  Result Date: 01/26/2021 CLINICAL DATA:  Incorrect needle count EXAM: OR LOCAL ABDOMEN COMPARISON:  None. FINDINGS: Single view radiograph of  the abdomen excludes the extreme flanks in the subdiaphragmatic region. Surgical changes of L3-S1 lumbar fusion with instrumentation is identified. Sacral plexus neurostimulator is seen with its battery pack and single lead overlying the right hemipelvis. No unexpected radiopaque foreign body within the visualized abdomen. Normal abdominal gas pattern. Post traumatic or postsurgical change noted involving the left iliac crest. No acute bone abnormality. IMPRESSION: No unexpected radiopaque foreign body within the visualized abdomen. These results were called by telephone at the time of interpretation on 01/26/2021 at 10:56 am to provider [facility] , who verbally acknowledged these results. Electronically Signed   By: Fidela Salisbury MD   On: 01/26/2021 10:59    Discharge Instructions    Incentive spirometry RT   Complete by: As directed        Follow-up Information    Melina Schools, MD. Schedule an appointment as soon as possible for a visit in 2 weeks.   Specialty: Orthopedic Surgery Why: If symptoms worsen, For suture removal, For wound re-check Contact information: [address] [place] [address] [phone]        [facility] Follow up.   Why: [place] will call you within 24-48 hours to schedule your first appointment.  Contact  information: [address] [place], [place] [address] P: [phone]              Discharge Plan:  discharge to home with homehealth  Disposition: stable    Signed: Yvonne Kendall Waymond Meador for [facility] PA-C [facility] [phone] 02/07/2021, 8:15 AM

## 2021-02-10 DIAGNOSIS — M5136 Other intervertebral disc degeneration, lumbar region: Secondary | ICD-10-CM | POA: Diagnosis not present

## 2021-02-10 DIAGNOSIS — M4310 Spondylolisthesis, site unspecified: Secondary | ICD-10-CM | POA: Diagnosis not present

## 2021-02-10 DIAGNOSIS — M1990 Unspecified osteoarthritis, unspecified site: Secondary | ICD-10-CM | POA: Diagnosis not present

## 2021-02-10 DIAGNOSIS — Z4789 Encounter for other orthopedic aftercare: Secondary | ICD-10-CM | POA: Diagnosis not present

## 2021-02-10 DIAGNOSIS — M797 Fibromyalgia: Secondary | ICD-10-CM | POA: Diagnosis not present

## 2021-02-10 DIAGNOSIS — M5010 Cervical disc disorder with radiculopathy, unspecified cervical region: Secondary | ICD-10-CM | POA: Diagnosis not present

## 2021-03-08 DIAGNOSIS — Z981 Arthrodesis status: Secondary | ICD-10-CM | POA: Diagnosis not present

## 2021-03-08 DIAGNOSIS — Z4889 Encounter for other specified surgical aftercare: Secondary | ICD-10-CM | POA: Diagnosis not present

## 2021-03-09 DIAGNOSIS — M545 Low back pain, unspecified: Secondary | ICD-10-CM | POA: Diagnosis not present

## 2021-03-11 DIAGNOSIS — Z96653 Presence of artificial knee joint, bilateral: Secondary | ICD-10-CM | POA: Diagnosis not present

## 2021-03-12 ENCOUNTER — Other Ambulatory Visit: Payer: Self-pay

## 2021-03-12 DIAGNOSIS — R0989 Other specified symptoms and signs involving the circulatory and respiratory systems: Secondary | ICD-10-CM

## 2021-03-14 ENCOUNTER — Encounter (HOSPITAL_COMMUNITY): Payer: Medicare Other

## 2021-03-14 ENCOUNTER — Ambulatory Visit: Payer: Medicare Other

## 2021-03-16 DIAGNOSIS — M545 Low back pain, unspecified: Secondary | ICD-10-CM | POA: Diagnosis not present

## 2021-03-18 ENCOUNTER — Encounter: Payer: Self-pay | Admitting: Physician Assistant

## 2021-03-18 ENCOUNTER — Other Ambulatory Visit: Payer: Self-pay

## 2021-03-18 ENCOUNTER — Ambulatory Visit (INDEPENDENT_AMBULATORY_CARE_PROVIDER_SITE_OTHER): Payer: Medicare Other | Admitting: Physician Assistant

## 2021-03-18 ENCOUNTER — Ambulatory Visit (HOSPITAL_COMMUNITY)
Admission: RE | Admit: 2021-03-18 | Discharge: 2021-03-18 | Disposition: A | Discharge status: Home / Self Care | Payer: Medicare Other | Source: Ambulatory Visit | Attending: Vascular Surgery | Admitting: Vascular Surgery

## 2021-03-18 VITALS — BP 149/75 | HR 53 | Temp 98.0°F | Resp 20 | Ht 59.0 in | Wt 146.5 lb

## 2021-03-18 DIAGNOSIS — M545 Low back pain, unspecified: Secondary | ICD-10-CM | POA: Diagnosis not present

## 2021-03-18 DIAGNOSIS — R0989 Other specified symptoms and signs involving the circulatory and respiratory systems: Secondary | ICD-10-CM | POA: Diagnosis not present

## 2021-03-18 NOTE — Progress Notes (Signed)
History of Present Illness:  Patient is a 65 y.o. year old female who presents for evaluation of carotid stenosis.  She was seen by Dr. Donnetta Hutching on 01/17/21 for pre-op evaluation of anterior spine exposure.  At that time Dr. Donnetta Hutching heard a bruit on exam.  She is here today for f/u carotid duplex to r/o carotid stenosis.  The patient denies symptoms of TIA, amaurosis, or stroke.  The patient is currently not on antiplatelet therapy or statins due to allergy.    Past Medical History:  Diagnosis Date  . Acute coronary syndrome ([place])   . Acute exacerbation of chronic obstructive airways disease ([place])   . Acute renal failure syndrome ([place])   . Agoraphobia without history of panic disorder   . Anginal pain ([place])   . Anxiety   . Arthritis    "qwhere" (04/03/2018)  . Bilateral carpal tunnel syndrome 02/18/2016  . Bilateral knee pain   . Carpal tunnel syndrome of left wrist   . Chest pain   . Childhood asthma   . Chronic bronchitis ([place])   . Chronic lower back pain   . Colitis presumed infectious   . COPD (chronic obstructive pulmonary disease) ([place])   . Coronary artery disease CARDIOLOGIST- DR  Doylene Canard  (VISIT 03-30-11 W/ CHART)   NON-OBS. CAD   (STRESS TEST NOV. 2011  . Depression   . DJD (degenerative joint disease)    JOINT PAIN  . Dyspnea    "anytime but mostly on exertion and smoking"  . Dysrhythmia 2017   SVT  . Elevated liver enzymes   . Fibromyalgia   . Frequency of urination   . GERD (gastroesophageal reflux disease)     W/ NEXIUM  . Heart murmur   . Hemorrhoids   . High cholesterol   . History of blood transfusion    "related to anemia" (04/03/2018)  . History of carpal tunnel syndrome    Right  . History of gastric ulcer 2004  . Hyperlipidemia   . Hypertension   . IBS (irritable bowel syndrome)   . Incisional pain s/p interstim implant 1st stage--- 12-07-11    left upper buttock-- pt states dressing clean dry and intact (on 12-08-11)  . Influenza due to  influenza A virus   . Insomnia   . Internal derangement of left knee   . Iron deficiency anemia 2004  . Lesion of liver   . Leukocytosis   . Lumbar pain   . Nocturia   . Non-productive cough   . Numbness in both hands AT TIMES  . OSA (obstructive sleep apnea)    "suppose to wear mask; I don't" (04/03/2018)  . Osteoarthritis of left knee   . PONV (postoperative nausea and vomiting)   . Post laminectomy syndrome   . Preinfarction syndrome ([place])   . Supraventricular tachycardia ([place])   . SVT (supraventricular tachycardia) ([place])   . Tobacco user   . Urge urinary incontinence     Past Surgical History:  Procedure Laterality Date  . ABDOMINAL EXPOSURE N/A 01/26/2021   Procedure: ABDOMINAL EXPOSURE;  Surgeon: Rosetta Posner, MD;  Location: [facility];  Service: Vascular;  Laterality: N/A;  . ANTERIOR CERVICAL DECOMP/DISCECTOMY FUSION  2008   C4 - T1 ("screws from 1st OR came out")  . ANTERIOR CERVICAL DECOMP/DISCECTOMY FUSION  2002   C4 - 7  . ANTERIOR LATERAL LUMBAR FUSION WITH PERCUTANEOUS SCREW 2 LEVEL Left 11/27/2018   Procedure: XLIF Lumbar 3-5, posterior spinal fusion  L3-5;  Surgeon: Melina Schools, MD;  Location: [facility];  Service: Orthopedics;  Laterality: Left;  5.5 hrs for entire procedure  . ANTERIOR LUMBAR FUSION N/A 01/26/2021   Procedure: ANTERIOR LUMBAR FUSION LUMBAR FIVE-SACRAL ONE WITH POSTERIOR SPINAL FUSION INTERBODY (EXTENSION OF PREVIOUS FUSION);  Surgeon: Melina Schools, MD;  Location: [facility];  Service: Orthopedics;  Laterality: N/A;  6hrs Dr. Donnetta Hutching to do approach Tap Block with exparel  . APPENDECTOMY  1982  . BACK SURGERY    . CARDIAC CATHETERIZATION  2003  . CARDIAC CATHETERIZATION N/A 08/23/2016   Procedure: Left Heart Cath and Coronary Angiography;  Surgeon: Dixie Dials, MD;  Location: [facility];  Service: Cardiovascular;  Laterality: N/A;  . CARPAL TUNNEL RELEASE Right   . CATARACT EXTRACTION W/ INTRAOCULAR LENS  IMPLANT, BILATERAL  2016-2018  .  CHOLECYSTECTOMY OPEN  1982  . COLONOSCOPY    . CYSTO/ HOD/ BLADDER BX  2006  . CYSTO/ HOD/ BLADDER BX/ FULGERATION  06-12-2011  . EYE SURGERY    . FINGER SURGERY Right 2001   "replaced worn cartilage on thumb w/tendons"  . HYSTEROSCOPY WITH D & C  2005  . INTERSTIM IMPLANT PLACEMENT  12/07/2011   Procedure: Barrie Lyme IMPLANT FIRST STAGE;  Surgeon: [name], MD;  [facility];  Service: Urology;  Laterality: Right;  . INTERSTIM IMPLANT PLACEMENT  12/14/2011   Procedure: Barrie Lyme IMPLANT SECOND STAGE;  Surgeon: [name], MD;  Location: [facility];  Service: Urology;  Laterality: Right;  rad tech ok by vickie at [facility]  . JOINT REPLACEMENT    . LUMBAR LAMINECTOMY  2007   L3 - 5  . TONSILLECTOMY AND ADENOIDECTOMY  1965  . TOTAL KNEE ARTHROPLASTY Right 02/10/2020   Procedure: TOTAL KNEE ARTHROPLASTY;  Surgeon: Paralee Cancel, MD;  Location: [facility];  Service: Orthopedics;  Laterality: Right;  70 mins  . TOTAL KNEE ARTHROPLASTY Left 07/20/2020   Procedure: TOTAL KNEE ARTHROPLASTY;  Surgeon: Paralee Cancel, MD;  Location: [facility];  Service: Orthopedics;  Laterality: Left;  . TUBAL LIGATION    . UPPER GI ENDOSCOPY    . WRIST SURGERY Left    TFCC ("tendon repair")     Social History Social History   Tobacco Use  . Smoking status: Current Every Day Smoker    Packs/day: 2.00    Years: 43.00    Pack years: 86.00    Types: Cigarettes  . Smokeless tobacco: Never Used  Vaping Use  . Vaping Use: Never used  Substance Use Topics  . Alcohol use: Not Currently    Alcohol/week: 0.0 standard drinks    Comment: 04/03/2018 "nothing in years"  . Drug use: Not Currently    Types: Marijuana    Family History Family History  Problem Relation Age of Onset  . Hypertension Father   . Heart disease Father   . Kidney failure Father   . Breast cancer Mother   . Hypertension Mother   . Coronary artery disease Mother   . Alzheimer's disease  Mother   . Hypertension Brother   . Hypertension Sister   . Hypertension Daughter     Allergies  Allergies  Allergen Reactions  . Midazolam Hcl Other (See Comments)    HYPER  . Prednisone Other (See Comments)    HYPER, depressed, moody  . Statins Other (See Comments)    Causes muscle weakness and joint pain  . Ace Inhibitors Other (See Comments)    DECREASES HEARTRATE  .  Amoxicillin Diarrhea    Did it involve swelling of the face/tongue/throat, SOB, or low BP? No Did it involve sudden or severe rash/hives, skin peeling, or any reaction on the inside of your mouth or nose? No Did you need to seek medical attention at a hospital or doctor's office? No When did it last happen?~2019 or so If all above answers are "NO", may proceed with cephalosporin use.   . Nsaids Other (See Comments)    AVOIDS; HX GASTRIC ULCER  . Sulfa Antibiotics Rash and Other (See Comments)    JOINT PAIN     Current Outpatient Medications  Medication Sig Dispense Refill  . albuterol (PROVENTIL HFA;VENTOLIN HFA) 108 (90 BASE) MCG/ACT inhaler Inhale 2 puffs into the lungs every 6 (six) hours as needed for wheezing or shortness of breath.    . clonazePAM (KLONOPIN) 1 MG tablet Take 1 tablet (1 mg total) by mouth at bedtime. 30 tablet 5  . dextromethorphan-guaiFENesin (ROBITUSSIN-DM) 10-100 MG/5ML liquid Take 10 mLs by mouth every 4 (four) hours as needed for cough.    . diltiazem (TIAZAC) 120 MG 24 hr capsule Take 120 mg by mouth daily.    . DULoxetine (CYMBALTA) 60 MG capsule Take 60 mg by mouth at bedtime.    . gabapentin (NEURONTIN) 100 MG capsule Take 100 mg by mouth 2 (two) times daily.    . hydrALAZINE (APRESOLINE) 25 MG tablet Take 25 mg by mouth daily. Additional 25 mg if needed for high pressure    . Loperamide-Simethicone (ANTI-DIARRHEAL PLUS ANTI-GAS PO) Take 2 tablets by mouth daily as needed (gas and diarrhea).    . methocarbamol (ROBAXIN) 500 MG tablet 1 tablet as needed for muscle spasms     . metoprolol tartrate (LOPRESSOR) 25 MG tablet Take 0.5 tablets (12.5 mg total) by mouth 2 (two) times daily. 30 tablet 3  . nitroGLYCERIN (NITROSTAT) 0.4 MG SL tablet Place 1 tablet (0.4 mg total) under the tongue every 5 (five) minutes x 3 doses as needed for chest pain. 10 tablet 0  . ondansetron (ZOFRAN) 4 MG tablet Take 1 tablet (4 mg total) by mouth every 8 (eight) hours as needed for nausea or vomiting. 20 tablet 0  . oxyCODONE-acetaminophen (PERCOCET) 10-325 MG tablet Take 1 tablet by mouth 4 (four) times daily as needed.    . pantoprazole (PROTONIX) 40 MG tablet Take 1 tablet (40 mg total) by mouth 2 (two) times daily. (Patient taking differently: Take 40 mg by mouth daily.) 60 tablet 0  . traMADol (ULTRAM) 50 MG tablet tramadol 50 mg tablet    . Vitamin D, Ergocalciferol, (DRISDOL) 1.25 MG (50000 UNIT) CAPS capsule Take 50,000 Units by mouth every Wednesday.    . enoxaparin (LOVENOX) 40 MG/0.4ML injection Inject 0.4 mLs (40 mg total) into the skin daily for 10 days. 10 day supply 1 injection per day 4 mL 0   No current facility-administered medications for this visit.    ROS:   General:  No weight loss, Fever, chills  HEENT: No recent headaches, no nasal bleeding, no visual changes, no sore throat  Neurologic: No dizziness, blackouts, seizures. No recent symptoms of stroke or mini- stroke. No recent episodes of slurred speech, or temporary blindness.  Cardiac: No recent episodes of chest pain/pressure, no shortness of breath at rest.  No shortness of breath with exertion.  Denies history of atrial fibrillation or irregular heartbeat  Vascular: No history of rest pain in feet.  No history of claudication.  No history of non-healing  ulcer, No history of DVT   Pulmonary: No home oxygen, no productive cough, no hemoptysis,  No asthma or wheezing  Musculoskeletal:  [ ]  Arthritis, [x ] Low back pain,  [ ]  Joint pain  Hematologic:No history of hypercoagulable state.  No history of  easy bleeding.  No history of anemia  Gastrointestinal: No hematochezia or melena,  No gastroesophageal reflux, no trouble swallowing  Urinary: [ ]  chronic Kidney disease, [ ]  on HD - [ ]  MWF or [ ]  TTHS, [ ]  Burning with urination, [ ]  Frequent urination, [ ]  Difficulty urinating;   Skin: No rashes  Psychological: No history of anxiety,  No history of depression   Physical Examination  Vitals:   03/18/21 1121  BP: (!) 146/71  Pulse: (!) 53  Resp: 20  Temp: 98 F (36.7 C)  TempSrc: Temporal  SpO2: 96%  Weight: 146 lb 8 oz (66.5 kg)  Height: 4\' 11"  (1.499 m)    Body mass index is 29.59 kg/m.  General:  Alert and oriented, no acute distress HEENT: Normal Neck: positive bruit which maybe actually her heart murmur Pulmonary: Clear to auscultation bilaterally Cardiac: Regular Rate and Rhythm without murmur Gastrointestinal: Soft, non-tender, non-distended, no mass, no scars Skin: No rash Extremity Pulses:  2+ radial, brachial, femoral, dorsalis pedis, posterior tibial pulses bilaterally Musculoskeletal: No deformity or edema  Neurologic: Upper and lower extremity motor 5/5 and symmetric  DATA:    Right Carotid Findings:  +----------+--------+--------+--------+------------------+-----------------  ----+       PSV cm/sEDV cm/sStenosisPlaque DescriptionComments         +----------+--------+--------+--------+------------------+-----------------  ----+  CCA Prox 83   17                             +----------+--------+--------+--------+------------------+-----------------  ----+  CCA Distal73   15                             +----------+--------+--------+--------+------------------+-----------------  ----+  ICA Prox 79   21   1-39%           upper end of  range                              based on plaque  and                              turbulence        +----------+--------+--------+--------+------------------+-----------------  ----+  ICA Mid  84   19                             +----------+--------+--------+--------+------------------+-----------------  ----+  ICA Distal100   27                             +----------+--------+--------+--------+------------------+-----------------  ----+  ECA    90   6                              +----------+--------+--------+--------+------------------+-----------------  ----+   +----------+--------+-------+----------------+-------------------+       PSV cm/sEDV cmsDescribe    Arm Pressure (mmHG)  +----------+--------+-------+----------------+-------------------+  [id number]       Multiphasic, WNL            +----------+--------+-------+----------------+-------------------+   +---------+--------+--+--------+-+---------+  VertebralPSV cm/s27EDV cm/s9Antegrade  +---------+--------+--+--------+-+---------+      Left Carotid Findings:  +----------+--------+--------+--------+------------------+-----------------  ----+       PSV cm/sEDV cm/sStenosisPlaque DescriptionComments         +----------+--------+--------+--------+------------------+-----------------  ----+  CCA Prox 86   20                             +----------+--------+--------+--------+------------------+-----------------  ----+  CCA Distal71   26                             +----------+--------+--------+--------+------------------+-----------------  ----+  ICA Prox 115   27   1-39%           upper end of  range                               based on plaque  and                             turbulence        +----------+--------+--------+--------+------------------+-----------------  ----+  ICA Mid  100   24                             +----------+--------+--------+--------+------------------+-----------------  ----+  ICA Distal87   17                             +----------+--------+--------+--------+------------------+-----------------  ----+  ECA    230   18   >50%                        +----------+--------+--------+--------+------------------+-----------------  ----+   +----------+--------+--------+----------------+-------------------+       PSV cm/sEDV cm/sDescribe    Arm Pressure (mmHG)  +----------+--------+--------+----------------+-------------------+  [id number]       Multiphasic, WNL            +----------+--------+--------+----------------+-------------------+   +---------+--------+--+--------+--+---------+  VertebralPSV cm/s63EDV cm/s21Antegrade  +---------+--------+--+--------+--+---------+        Summary:  Right Carotid: Velocities in the right ICA are consistent with a 1-39%  stenosis.   Left Carotid: Velocities in the left ICA are consistent with a 1-39%  stenosis.        The ECA appears >50% stenosed.   ASSESSMENT:  Asymptomatic carotid stenosis found on exam for anterior lumbar exposure. Her duplex shows B carotid stenosis < 39% B. I will have her f/u in 2 years for repeat study and surveillance.       Roxy Horseman PA-C [facility] Office: [phone]  MD in office [place]

## 2021-03-22 DIAGNOSIS — M545 Low back pain, unspecified: Secondary | ICD-10-CM | POA: Diagnosis not present

## 2021-03-24 DIAGNOSIS — M545 Low back pain, unspecified: Secondary | ICD-10-CM | POA: Diagnosis not present

## 2021-03-28 DIAGNOSIS — M545 Low back pain, unspecified: Secondary | ICD-10-CM | POA: Diagnosis not present

## 2021-03-31 DIAGNOSIS — M545 Low back pain, unspecified: Secondary | ICD-10-CM | POA: Diagnosis not present

## 2021-04-05 DIAGNOSIS — M545 Low back pain, unspecified: Secondary | ICD-10-CM | POA: Diagnosis not present

## 2021-04-07 DIAGNOSIS — M545 Low back pain, unspecified: Secondary | ICD-10-CM | POA: Diagnosis not present

## 2021-04-13 DIAGNOSIS — R0602 Shortness of breath: Secondary | ICD-10-CM | POA: Diagnosis not present

## 2021-04-13 DIAGNOSIS — I10 Essential (primary) hypertension: Secondary | ICD-10-CM | POA: Diagnosis not present

## 2021-04-13 DIAGNOSIS — M25562 Pain in left knee: Secondary | ICD-10-CM | POA: Diagnosis not present

## 2021-04-13 DIAGNOSIS — R072 Precordial pain: Secondary | ICD-10-CM | POA: Diagnosis not present

## 2021-04-19 DIAGNOSIS — M545 Low back pain, unspecified: Secondary | ICD-10-CM | POA: Diagnosis not present

## 2021-04-19 DIAGNOSIS — Z4889 Encounter for other specified surgical aftercare: Secondary | ICD-10-CM | POA: Diagnosis not present

## 2021-04-27 DIAGNOSIS — M545 Low back pain, unspecified: Secondary | ICD-10-CM | POA: Diagnosis not present

## 2021-04-29 ENCOUNTER — Other Ambulatory Visit: Payer: Self-pay | Admitting: Cardiology

## 2021-04-29 DIAGNOSIS — M545 Low back pain, unspecified: Secondary | ICD-10-CM | POA: Diagnosis not present

## 2021-05-02 ENCOUNTER — Telehealth: Payer: Self-pay | Admitting: Cardiology

## 2021-05-02 ENCOUNTER — Telehealth: Payer: Self-pay | Admitting: *Deleted

## 2021-05-02 NOTE — Telephone Encounter (Signed)
Pt reports that she is still taking this medication Advised I would get refill sent in (90 day supply) Patient verbalized understanding and agreeable to plan.

## 2021-05-02 NOTE — Telephone Encounter (Signed)
Left message for pt to call back to discuss

## 2021-05-02 NOTE — Telephone Encounter (Signed)
Follow Up:      Pt is returning Sherri's call from today.

## 2021-05-02 NOTE — Telephone Encounter (Signed)
Patient's Pharmacy is requesting diltiazem (Tiazac) 120 mg 24 hr capsule. This medication was prescribed by another provider. Please advise if ok to fill. Thank you

## 2021-05-02 NOTE — Telephone Encounter (Signed)
Duplicate, see other telephone note from today for further documentation

## 2021-05-02 NOTE — Telephone Encounter (Signed)
[name] M at 05/02/2021 3:44 PM  Status: Signed    Follow Up:      Pt is returning [name]'s call from today.

## 2021-05-04 DIAGNOSIS — M545 Low back pain, unspecified: Secondary | ICD-10-CM | POA: Diagnosis not present

## 2021-05-06 MED ORDER — DILTIAZEM HCL ER BEADS 120 MG PO CP24: 120.0000 mg | ORAL_CAPSULE | Freq: Every day | ORAL | 0 refills | Status: DC

## 2021-05-06 NOTE — Addendum Note (Signed)
Addended by: Stanton Kidney on: 05/06/2021 03:23 PM   Modules accepted: Orders

## 2021-05-12 DIAGNOSIS — M545 Low back pain, unspecified: Secondary | ICD-10-CM | POA: Diagnosis not present

## 2021-05-17 DIAGNOSIS — M545 Low back pain, unspecified: Secondary | ICD-10-CM | POA: Diagnosis not present

## 2021-05-30 ENCOUNTER — Other Ambulatory Visit: Payer: Self-pay | Admitting: Cardiology

## 2021-05-31 DIAGNOSIS — H35373 Puckering of macula, bilateral: Secondary | ICD-10-CM | POA: Diagnosis not present

## 2021-07-04 DIAGNOSIS — Z1231 Encounter for screening mammogram for malignant neoplasm of breast: Secondary | ICD-10-CM | POA: Diagnosis not present

## 2021-07-04 DIAGNOSIS — Z780 Asymptomatic menopausal state: Secondary | ICD-10-CM | POA: Diagnosis not present

## 2021-07-04 DIAGNOSIS — M810 Age-related osteoporosis without current pathological fracture: Secondary | ICD-10-CM | POA: Diagnosis not present

## 2021-07-04 DIAGNOSIS — M85852 Other specified disorders of bone density and structure, left thigh: Secondary | ICD-10-CM | POA: Diagnosis not present

## 2021-07-11 DIAGNOSIS — M810 Age-related osteoporosis without current pathological fracture: Secondary | ICD-10-CM | POA: Diagnosis not present

## 2021-07-13 DIAGNOSIS — I10 Essential (primary) hypertension: Secondary | ICD-10-CM | POA: Diagnosis not present

## 2021-07-13 DIAGNOSIS — R072 Precordial pain: Secondary | ICD-10-CM | POA: Diagnosis not present

## 2021-07-13 DIAGNOSIS — J4520 Mild intermittent asthma, uncomplicated: Secondary | ICD-10-CM | POA: Diagnosis not present

## 2021-07-13 DIAGNOSIS — E6609 Other obesity due to excess calories: Secondary | ICD-10-CM | POA: Diagnosis not present

## 2021-07-13 DIAGNOSIS — M25562 Pain in left knee: Secondary | ICD-10-CM | POA: Diagnosis not present

## 2021-07-13 DIAGNOSIS — M48061 Spinal stenosis, lumbar region without neurogenic claudication: Secondary | ICD-10-CM | POA: Diagnosis not present

## 2021-07-13 DIAGNOSIS — R0602 Shortness of breath: Secondary | ICD-10-CM | POA: Diagnosis not present

## 2021-07-18 DIAGNOSIS — Z4889 Encounter for other specified surgical aftercare: Secondary | ICD-10-CM | POA: Diagnosis not present

## 2021-07-21 DIAGNOSIS — Z20822 Contact with and (suspected) exposure to covid-19: Secondary | ICD-10-CM | POA: Diagnosis not present

## 2021-07-26 DIAGNOSIS — I10 Essential (primary) hypertension: Secondary | ICD-10-CM | POA: Diagnosis not present

## 2021-07-26 DIAGNOSIS — J4520 Mild intermittent asthma, uncomplicated: Secondary | ICD-10-CM | POA: Diagnosis not present

## 2021-07-26 DIAGNOSIS — R0602 Shortness of breath: Secondary | ICD-10-CM | POA: Diagnosis not present

## 2021-07-26 DIAGNOSIS — R072 Precordial pain: Secondary | ICD-10-CM | POA: Diagnosis not present

## 2021-08-22 DIAGNOSIS — Z20822 Contact with and (suspected) exposure to covid-19: Secondary | ICD-10-CM | POA: Diagnosis not present

## 2021-08-31 DIAGNOSIS — M48061 Spinal stenosis, lumbar region without neurogenic claudication: Secondary | ICD-10-CM | POA: Diagnosis not present

## 2021-08-31 DIAGNOSIS — R0602 Shortness of breath: Secondary | ICD-10-CM | POA: Diagnosis not present

## 2021-08-31 DIAGNOSIS — J4520 Mild intermittent asthma, uncomplicated: Secondary | ICD-10-CM | POA: Diagnosis not present

## 2021-08-31 DIAGNOSIS — I10 Essential (primary) hypertension: Secondary | ICD-10-CM | POA: Diagnosis not present

## 2021-08-31 DIAGNOSIS — R072 Precordial pain: Secondary | ICD-10-CM | POA: Diagnosis not present

## 2021-09-15 DIAGNOSIS — R252 Cramp and spasm: Secondary | ICD-10-CM | POA: Diagnosis not present

## 2021-09-23 DIAGNOSIS — Z23 Encounter for immunization: Secondary | ICD-10-CM | POA: Diagnosis not present

## 2021-11-13 ENCOUNTER — Other Ambulatory Visit: Payer: Self-pay

## 2021-11-13 ENCOUNTER — Emergency Department (HOSPITAL_COMMUNITY): Payer: Medicare Other

## 2021-11-13 ENCOUNTER — Ambulatory Visit
Admission: EM | Admit: 2021-11-13 | Discharge: 2021-11-13 | Disposition: A | Discharge status: Nursing Home (Includes ICF) | Payer: Medicare Other

## 2021-11-13 ENCOUNTER — Encounter (HOSPITAL_COMMUNITY): Payer: Self-pay | Admitting: Emergency Medicine

## 2021-11-13 ENCOUNTER — Inpatient Hospital Stay (HOSPITAL_COMMUNITY)
Admission: EM | Admit: 2021-11-13 | Discharge: 2021-11-19 | DRG: 193 | Disposition: A | Discharge status: Home / Self Care | Payer: Medicare Other | Source: Ambulatory Visit | Attending: Internal Medicine | Admitting: Internal Medicine

## 2021-11-13 ENCOUNTER — Encounter: Payer: Self-pay | Admitting: *Deleted

## 2021-11-13 DIAGNOSIS — E876 Hypokalemia: Secondary | ICD-10-CM | POA: Diagnosis not present

## 2021-11-13 DIAGNOSIS — J441 Chronic obstructive pulmonary disease with (acute) exacerbation: Secondary | ICD-10-CM

## 2021-11-13 DIAGNOSIS — G4700 Insomnia, unspecified: Secondary | ICD-10-CM | POA: Diagnosis present

## 2021-11-13 DIAGNOSIS — G8929 Other chronic pain: Secondary | ICD-10-CM | POA: Diagnosis present

## 2021-11-13 DIAGNOSIS — J101 Influenza due to other identified influenza virus with other respiratory manifestations: Principal | ICD-10-CM | POA: Diagnosis present

## 2021-11-13 DIAGNOSIS — R0602 Shortness of breath: Secondary | ICD-10-CM | POA: Diagnosis not present

## 2021-11-13 DIAGNOSIS — Z8711 Personal history of peptic ulcer disease: Secondary | ICD-10-CM

## 2021-11-13 DIAGNOSIS — Z7983 Long term (current) use of bisphosphonates: Secondary | ICD-10-CM

## 2021-11-13 DIAGNOSIS — Z20822 Contact with and (suspected) exposure to covid-19: Secondary | ICD-10-CM | POA: Diagnosis present

## 2021-11-13 DIAGNOSIS — J209 Acute bronchitis, unspecified: Secondary | ICD-10-CM | POA: Diagnosis present

## 2021-11-13 DIAGNOSIS — R7989 Other specified abnormal findings of blood chemistry: Secondary | ICD-10-CM

## 2021-11-13 DIAGNOSIS — E7800 Pure hypercholesterolemia, unspecified: Secondary | ICD-10-CM | POA: Diagnosis present

## 2021-11-13 DIAGNOSIS — J9601 Acute respiratory failure with hypoxia: Secondary | ICD-10-CM | POA: Diagnosis present

## 2021-11-13 DIAGNOSIS — I214 Non-ST elevation (NSTEMI) myocardial infarction: Secondary | ICD-10-CM

## 2021-11-13 DIAGNOSIS — M545 Low back pain, unspecified: Secondary | ICD-10-CM | POA: Diagnosis present

## 2021-11-13 DIAGNOSIS — K589 Irritable bowel syndrome without diarrhea: Secondary | ICD-10-CM | POA: Diagnosis present

## 2021-11-13 DIAGNOSIS — I2510 Atherosclerotic heart disease of native coronary artery without angina pectoris: Secondary | ICD-10-CM | POA: Diagnosis present

## 2021-11-13 DIAGNOSIS — F17210 Nicotine dependence, cigarettes, uncomplicated: Secondary | ICD-10-CM | POA: Diagnosis present

## 2021-11-13 DIAGNOSIS — Z6833 Body mass index (BMI) 33.0-33.9, adult: Secondary | ICD-10-CM | POA: Diagnosis not present

## 2021-11-13 DIAGNOSIS — F32A Depression, unspecified: Secondary | ICD-10-CM | POA: Diagnosis not present

## 2021-11-13 DIAGNOSIS — Z7982 Long term (current) use of aspirin: Secondary | ICD-10-CM

## 2021-11-13 DIAGNOSIS — Z9851 Tubal ligation status: Secondary | ICD-10-CM

## 2021-11-13 DIAGNOSIS — F419 Anxiety disorder, unspecified: Secondary | ICD-10-CM | POA: Diagnosis not present

## 2021-11-13 DIAGNOSIS — R0902 Hypoxemia: Secondary | ICD-10-CM | POA: Diagnosis not present

## 2021-11-13 DIAGNOSIS — Z79899 Other long term (current) drug therapy: Secondary | ICD-10-CM

## 2021-11-13 DIAGNOSIS — E669 Obesity, unspecified: Secondary | ICD-10-CM | POA: Diagnosis present

## 2021-11-13 DIAGNOSIS — R062 Wheezing: Secondary | ICD-10-CM

## 2021-11-13 DIAGNOSIS — K219 Gastro-esophageal reflux disease without esophagitis: Secondary | ICD-10-CM | POA: Diagnosis present

## 2021-11-13 DIAGNOSIS — M797 Fibromyalgia: Secondary | ICD-10-CM | POA: Diagnosis present

## 2021-11-13 DIAGNOSIS — E860 Dehydration: Secondary | ICD-10-CM | POA: Diagnosis present

## 2021-11-13 DIAGNOSIS — K7689 Other specified diseases of liver: Secondary | ICD-10-CM | POA: Diagnosis not present

## 2021-11-13 DIAGNOSIS — Z882 Allergy status to sulfonamides status: Secondary | ICD-10-CM

## 2021-11-13 DIAGNOSIS — I471 Supraventricular tachycardia, unspecified: Secondary | ICD-10-CM | POA: Diagnosis present

## 2021-11-13 DIAGNOSIS — M1990 Unspecified osteoarthritis, unspecified site: Secondary | ICD-10-CM | POA: Diagnosis present

## 2021-11-13 DIAGNOSIS — Z880 Allergy status to penicillin: Secondary | ICD-10-CM

## 2021-11-13 DIAGNOSIS — G4733 Obstructive sleep apnea (adult) (pediatric): Secondary | ICD-10-CM | POA: Diagnosis present

## 2021-11-13 DIAGNOSIS — Z720 Tobacco use: Secondary | ICD-10-CM | POA: Diagnosis not present

## 2021-11-13 DIAGNOSIS — Z888 Allergy status to other drugs, medicaments and biological substances status: Secondary | ICD-10-CM

## 2021-11-13 DIAGNOSIS — Z8249 Family history of ischemic heart disease and other diseases of the circulatory system: Secondary | ICD-10-CM

## 2021-11-13 DIAGNOSIS — J440 Chronic obstructive pulmonary disease with acute lower respiratory infection: Secondary | ICD-10-CM | POA: Diagnosis not present

## 2021-11-13 DIAGNOSIS — R509 Fever, unspecified: Secondary | ICD-10-CM | POA: Diagnosis not present

## 2021-11-13 DIAGNOSIS — I10 Essential (primary) hypertension: Secondary | ICD-10-CM | POA: Diagnosis present

## 2021-11-13 DIAGNOSIS — R059 Cough, unspecified: Secondary | ICD-10-CM | POA: Diagnosis not present

## 2021-11-13 DIAGNOSIS — Z981 Arthrodesis status: Secondary | ICD-10-CM

## 2021-11-13 LAB — COMPREHENSIVE METABOLIC PANEL
ALT: 82 U/L — ABNORMAL HIGH (ref 0–44)
AST: 97 U/L — ABNORMAL HIGH (ref 15–41)
Albumin: 4.6 g/dL (ref 3.5–5.0)
Alkaline Phosphatase: 120 U/L (ref 38–126)
Anion gap: 9 (ref 5–15)
BUN: 13 mg/dL (ref 8–23)
CO2: 29 mmol/L (ref 22–32)
Calcium: 8.8 mg/dL — ABNORMAL LOW (ref 8.9–10.3)
Chloride: 99 mmol/L (ref 98–111)
Creatinine, Ser: 0.88 mg/dL (ref 0.44–1.00)
GFR, Estimated: >60 mL/min (ref >60)
Glucose, Bld: 133 mg/dL — ABNORMAL HIGH (ref 70–99)
Potassium: 3.2 mmol/L — ABNORMAL LOW (ref 3.5–5.1)
Sodium: 137 mmol/L (ref 135–145)
Total Bilirubin: 0.9 mg/dL (ref 0.3–1.2)
Total Protein: 7.8 g/dL (ref 6.5–8.1)

## 2021-11-13 LAB — BLOOD GAS, VENOUS
Acid-Base Excess: 3.6 mmol/L — ABNORMAL HIGH (ref 0.0–2.0)
Bicarbonate: 28.5 mmol/L — ABNORMAL HIGH (ref 20.0–28.0)
O2 Saturation: 66.6 %
Patient temperature: 98.6
pCO2, Ven: 46 mmHg (ref 44.0–60.0)
pH, Ven: 7.409 (ref 7.250–7.430)
pO2, Ven: 36.8 mmHg (ref 32.0–45.0)

## 2021-11-13 LAB — RESP PANEL BY RT-PCR (FLU A&B, COVID) ARPGX2
Influenza A by PCR: POSITIVE — AB
Influenza B by PCR: NEGATIVE
SARS Coronavirus 2 by RT PCR: NEGATIVE

## 2021-11-13 LAB — BRAIN NATRIURETIC PEPTIDE: B Natriuretic Peptide: 87 pg/mL (ref 0.0–100.0)

## 2021-11-13 LAB — TROPONIN I (HIGH SENSITIVITY)
Troponin I (High Sensitivity): 38 ng/L — ABNORMAL HIGH (ref <18)
Troponin I (High Sensitivity): 32 ng/L — ABNORMAL HIGH (ref <18)

## 2021-11-13 MED ORDER — ACETAMINOPHEN 650 MG RE SUPP: 650.0000 mg | Freq: Four times a day (QID) | RECTAL | Status: DC | PRN

## 2021-11-13 MED ORDER — ONDANSETRON HCL 4 MG/2ML IJ SOLN: 4.0000 mg | Freq: Four times a day (QID) | INTRAMUSCULAR | Status: DC | PRN

## 2021-11-13 MED ORDER — CLONAZEPAM 1 MG PO TABS
1.0000 mg | ORAL_TABLET | Freq: Every day | ORAL | Status: DC
  Administered 2021-11-14 – 2021-11-18 (×6): 1 mg via ORAL
  Filled 2021-11-13 (×6): qty 1

## 2021-11-13 MED ORDER — ACETAMINOPHEN 325 MG PO TABS
650.0000 mg | ORAL_TABLET | Freq: Four times a day (QID) | ORAL | Status: DC | PRN
  Administered 2021-11-14: 650 mg via ORAL
  Filled 2021-11-13: qty 2

## 2021-11-13 MED ORDER — HYDRALAZINE HCL 25 MG PO TABS
25.0000 mg | ORAL_TABLET | Freq: Two times a day (BID) | ORAL | Status: DC
  Administered 2021-11-14 – 2021-11-19 (×11): 25 mg via ORAL
  Filled 2021-11-13 (×12): qty 1

## 2021-11-13 MED ORDER — DILTIAZEM HCL ER COATED BEADS 120 MG PO CP24
120.0000 mg | ORAL_CAPSULE | Freq: Every morning | ORAL | Status: DC
  Administered 2021-11-14 – 2021-11-19 (×6): 120 mg via ORAL
  Filled 2021-11-13 (×6): qty 1

## 2021-11-13 MED ORDER — PREDNISONE 20 MG PO TABS
40.0000 mg | ORAL_TABLET | Freq: Every day | ORAL | Status: AC
  Administered 2021-11-14 – 2021-11-18 (×5): 40 mg via ORAL
  Filled 2021-11-13 (×5): qty 2

## 2021-11-13 MED ORDER — ASPIRIN 325 MG PO TABS
325.0000 mg | ORAL_TABLET | Freq: Every day | ORAL | Status: DC
  Administered 2021-11-13 – 2021-11-19 (×7): 325 mg via ORAL
  Filled 2021-11-13 (×7): qty 1

## 2021-11-13 MED ORDER — OSELTAMIVIR PHOSPHATE 75 MG PO CAPS: 75.0000 mg | ORAL_CAPSULE | Freq: Two times a day (BID) | ORAL | Status: DC

## 2021-11-13 MED ORDER — DULOXETINE HCL 30 MG PO CPEP
60.0000 mg | ORAL_CAPSULE | Freq: Every day | ORAL | Status: DC
  Administered 2021-11-14 – 2021-11-18 (×6): 60 mg via ORAL
  Filled 2021-11-13 (×6): qty 2

## 2021-11-13 MED ORDER — IPRATROPIUM-ALBUTEROL 0.5-2.5 (3) MG/3ML IN SOLN
3.0000 mL | RESPIRATORY_TRACT | Status: AC
  Administered 2021-11-13 (×2): 3 mL via RESPIRATORY_TRACT
  Filled 2021-11-13: qty 3
  Filled 2021-11-13: qty 6

## 2021-11-13 MED ORDER — OSELTAMIVIR PHOSPHATE 75 MG PO CAPS
75.0000 mg | ORAL_CAPSULE | Freq: Once | ORAL | Status: AC
  Administered 2021-11-13: 16:00:00 75 mg via ORAL
  Filled 2021-11-13: qty 1

## 2021-11-13 MED ORDER — POTASSIUM CHLORIDE CRYS ER 20 MEQ PO TBCR
40.0000 meq | EXTENDED_RELEASE_TABLET | Freq: Two times a day (BID) | ORAL | Status: AC
  Administered 2021-11-14 (×3): 40 meq via ORAL
  Filled 2021-11-13 (×3): qty 2

## 2021-11-13 MED ORDER — IPRATROPIUM-ALBUTEROL 0.5-2.5 (3) MG/3ML IN SOLN
3.0000 mL | Freq: Four times a day (QID) | RESPIRATORY_TRACT | Status: DC
  Administered 2021-11-14 – 2021-11-17 (×15): 3 mL via RESPIRATORY_TRACT
  Filled 2021-11-13 (×16): qty 3

## 2021-11-13 MED ORDER — ONDANSETRON HCL 4 MG PO TABS: 4.0000 mg | ORAL_TABLET | Freq: Four times a day (QID) | ORAL | Status: DC | PRN

## 2021-11-13 MED ORDER — ENOXAPARIN SODIUM 40 MG/0.4ML IJ SOSY
40.0000 mg | PREFILLED_SYRINGE | INTRAMUSCULAR | Status: DC
  Administered 2021-11-14 – 2021-11-19 (×6): 40 mg via SUBCUTANEOUS
  Filled 2021-11-13 (×6): qty 0.4

## 2021-11-13 MED ORDER — METHYLPREDNISOLONE SODIUM SUCC 125 MG IJ SOLR
60.0000 mg | Freq: Once | INTRAMUSCULAR | Status: AC
  Administered 2021-11-13: 16:00:00 60 mg via INTRAVENOUS
  Filled 2021-11-13: qty 2

## 2021-11-13 MED ORDER — PANTOPRAZOLE SODIUM 40 MG PO TBEC
40.0000 mg | DELAYED_RELEASE_TABLET | Freq: Every morning | ORAL | Status: DC
  Administered 2021-11-14 – 2021-11-19 (×6): 40 mg via ORAL
  Filled 2021-11-13 (×6): qty 1

## 2021-11-13 MED ORDER — GUAIFENESIN ER 600 MG PO TB12
600.0000 mg | ORAL_TABLET | Freq: Two times a day (BID) | ORAL | Status: DC
  Administered 2021-11-14: 01:00:00 600 mg via ORAL
  Filled 2021-11-13: qty 1

## 2021-11-13 MED ORDER — HYDRALAZINE HCL 20 MG/ML IJ SOLN
10.0000 mg | Freq: Three times a day (TID) | INTRAMUSCULAR | Status: DC | PRN
  Administered 2021-11-16 – 2021-11-19 (×3): 10 mg via INTRAVENOUS
  Filled 2021-11-13 (×3): qty 1

## 2021-11-13 MED ORDER — VITAMIN D3 25 MCG (1000 UNIT) PO TABS
1000.0000 [IU] | ORAL_TABLET | Freq: Every morning | ORAL | Status: DC
  Administered 2021-11-14 – 2021-11-19 (×6): 1000 [IU] via ORAL
  Filled 2021-11-13 (×6): qty 1

## 2021-11-13 NOTE — ED Notes (Signed)
Pt states dyspnea improved.

## 2021-11-13 NOTE — ED Provider Notes (Signed)
[facility] DEPT Provider Note   CSN: [id number] Arrival date & time: 11/13/21  1119     History Chief Complaint  Patient presents with   Shortness of Breath    [name] is a 65 y.o. female.  This is a 65 y.o. female with significant medical history as below, including COPD, HLD, DJD, who presents to the ED with complaint of dib. Sent from UC. Pt with worsening DIB last 3-4 days, worsening productive cough occ with white/clear sputum. Worsening DOE. No home o2 use, she is supposed to use CPAP but does not like to use it 2/2 discomfort. She has no chest pain, n/v, abd pain. She does report some subjective fevers/chills. Her inhaler is out of date and did not have any refills. Hypoxic at UC 89%.   The history is provided by the patient. No language interpreter was used.  Shortness of Breath Associated symptoms: cough, fever and wheezing   Associated symptoms: no abdominal pain, no chest pain, no headaches, no rash and no vomiting       Past Medical History:  Diagnosis Date   Acute coronary syndrome (HCC)    Acute exacerbation of chronic obstructive airways disease (HCC)    Acute renal failure syndrome (HCC)    Agoraphobia without history of panic disorder    Anginal pain ([place])    Anxiety    Arthritis    "qwhere" (04/03/2018)   Bilateral carpal tunnel syndrome 02/18/2016   Bilateral knee pain    Carpal tunnel syndrome of left wrist    Chest pain    Childhood asthma    Chronic bronchitis (HCC)    Chronic lower back pain    Colitis presumed infectious    COPD (chronic obstructive pulmonary disease) ([place])    Coronary artery disease CARDIOLOGIST- DR  Doylene Canard  (VISIT 03-30-11 W/ CHART)   NON-OBS. CAD   (STRESS TEST NOV. 2011   Depression    DJD (degenerative joint disease)    JOINT PAIN   Dyspnea    "anytime but mostly on exertion and smoking"   Dysrhythmia 2017   SVT   Elevated liver enzymes    Fibromyalgia    Frequency of urination     GERD (gastroesophageal reflux disease)     W/ NEXIUM   Heart murmur    Hemorrhoids    High cholesterol    History of blood transfusion    "related to anemia" (04/03/2018)   History of carpal tunnel syndrome    Right   History of gastric ulcer 2004   Hyperlipidemia    Hypertension    IBS (irritable bowel syndrome)    Incisional pain s/p interstim implant 1st stage--- 12-07-11    left upper buttock-- pt states dressing clean dry and intact (on 12-08-11)   Influenza due to influenza A virus    Insomnia    Internal derangement of left knee    Iron deficiency anemia 2004   Lesion of liver    Leukocytosis    Lumbar pain    Nocturia    Non-productive cough    Numbness in both hands AT TIMES   OSA (obstructive sleep apnea)    "suppose to wear mask; I don't" (04/03/2018)   Osteoarthritis of left knee    PONV (postoperative nausea and vomiting)    Post laminectomy syndrome    Preinfarction syndrome (HCC)    Supraventricular tachycardia (HCC)    SVT (supraventricular tachycardia) ([place])    Tobacco user  Urge urinary incontinence     Patient Active Problem List   Diagnosis Date Noted   S/P lumbar fusion 01/26/2021   Encounter for orthopedic follow-up care 10/25/2020   Pain in left knee 08/17/2020   Colitis presumed infectious 02/17/2020   Lesion of liver 02/17/2020   History of total knee arthroplasty 02/10/2020   Osteoarthritis of right knee 10/30/2019   Osteoarthritis of left knee 10/30/2019   Internal derangement of left knee 07/07/2019   Bilateral knee pain 03/21/2019   Low back pain 21/30/8657   Complication of surgical procedure 06/17/2018   Degeneration of lumbar intervertebral disc 06/17/2018   Degenerative spondylolisthesis 06/17/2018   Acute coronary syndrome ([place]) 04/02/2018   Hyperlipidemia 08/06/2017   Influenza due to influenza A virus 01/01/2017   Acute exacerbation of chronic obstructive airways disease ([place]) 01/01/2017   Acute renal failure syndrome  ([place]) 01/01/2017   Dehydration 01/01/2017   Preinfarction syndrome ([place]) 10/14/2016   Chest pain 08/22/2016   Supraventricular tachycardia ([place]) 08/22/2016   Obstructive sleep apnea syndrome    Hypertensive disorder    Gastroesophageal reflux disease    Depressive disorder    Coronary arteriosclerosis    Anxiety    Pain in the chest    Hypokalemia    Carpal tunnel syndrome of left wrist 02/18/2016   Leukocytosis 05/26/2014   Tobacco user 05/26/2014   Urge incontinence of urine 12/14/2011    Past Surgical History:  Procedure Laterality Date   ABDOMINAL EXPOSURE N/A 01/26/2021   Procedure: ABDOMINAL EXPOSURE;  Surgeon: Rosetta Posner, MD;  Location: [facility];  Service: Vascular;  Laterality: N/A;   ANTERIOR CERVICAL DECOMP/DISCECTOMY FUSION  2008   C4 - T1 ("screws from 1st OR came out")   ANTERIOR CERVICAL DECOMP/DISCECTOMY FUSION  2002   C4 - 7   ANTERIOR LATERAL LUMBAR FUSION WITH PERCUTANEOUS SCREW 2 LEVEL Left 11/27/2018   Procedure: XLIF Lumbar 3-5, posterior spinal fusion L3-5;  Surgeon: Melina Schools, MD;  Location: [facility];  Service: Orthopedics;  Laterality: Left;  5.5 hrs for entire procedure   ANTERIOR LUMBAR FUSION N/A 01/26/2021   Procedure: ANTERIOR LUMBAR FUSION LUMBAR FIVE-SACRAL ONE WITH POSTERIOR SPINAL FUSION INTERBODY (EXTENSION OF PREVIOUS FUSION);  Surgeon: Melina Schools, MD;  Location: [facility];  Service: Orthopedics;  Laterality: N/A;  6hrs Dr. Donnetta Hutching to do approach Tap Block with exparel   APPENDECTOMY  1982   BACK SURGERY     CARDIAC CATHETERIZATION  2003   CARDIAC CATHETERIZATION N/A 08/23/2016   Procedure: Left Heart Cath and Coronary Angiography;  Surgeon: Dixie Dials, MD;  Location: [facility];  Service: Cardiovascular;  Laterality: N/A;   CARPAL TUNNEL RELEASE Right    CATARACT EXTRACTION W/ INTRAOCULAR LENS  IMPLANT, BILATERAL  2016-2018   CHOLECYSTECTOMY OPEN  1982   COLONOSCOPY     CYSTO/ HOD/ BLADDER BX  2006   CYSTO/ HOD/ BLADDER BX/  FULGERATION  06-12-2011   EYE SURGERY     FINGER SURGERY Right 2001   "replaced worn cartilage on thumb w/tendons"   HYSTEROSCOPY WITH D & C  2005   INTERSTIM IMPLANT PLACEMENT  12/07/2011   Procedure: Barrie Lyme IMPLANT FIRST STAGE;  Surgeon: [name], MD;  Location: [facility];  Service: Urology;  Laterality: Right;   INTERSTIM IMPLANT PLACEMENT  12/14/2011   Procedure: Barrie Lyme IMPLANT SECOND STAGE;  Surgeon: [name], MD;  [facility]: [facility];  Service: Urology;  Laterality: Right;  rad tech ok by vickie  at [place]  2007   L3 - [place]   TOTAL KNEE ARTHROPLASTY Right 02/10/2020   Procedure: TOTAL KNEE ARTHROPLASTY;  Surgeon: Paralee Cancel, MD;  Location: [facility];  Service: Orthopedics;  Laterality: Right;  70 mins   TOTAL KNEE ARTHROPLASTY Left 07/20/2020   Procedure: TOTAL KNEE ARTHROPLASTY;  Surgeon: Paralee Cancel, MD;  Location: [facility];  Service: Orthopedics;  Laterality: Left;   TUBAL LIGATION     UPPER GI ENDOSCOPY     WRIST SURGERY Left    TFCC ("tendon repair")     OB History   No obstetric history on file.     Family History  Problem Relation Age of Onset   Hypertension Father    Heart disease Father    Kidney failure Father    Breast cancer Mother    Hypertension Mother    Coronary artery disease Mother    Alzheimer's disease Mother    Hypertension Brother    Hypertension Sister    Hypertension Daughter     Social History   Tobacco Use   Smoking status: Every Day    Packs/day: 2.00    Years: 43.00    Pack years: 86.00    Types: Cigarettes   Smokeless tobacco: Never  Vaping Use   Vaping Use: Never used  Substance Use Topics   Alcohol use: Not Currently    Alcohol/week: 0.0 standard drinks    Comment: 04/03/2018 "nothing in years"   Drug use: Not Currently    Types: Marijuana    Home Medications Prior to Admission  medications   Medication Sig Start Date End Date Taking? Authorizing Provider  albuterol (PROVENTIL HFA;VENTOLIN HFA) 108 (90 BASE) MCG/ACT inhaler Inhale 2 puffs into the lungs every 6 (six) hours as needed for wheezing or shortness of breath.    [provider]  alendronate (FOSAMAX) 70 MG tablet Take 70 mg by mouth once a week. Take with a full glass of water on an empty stomach.    [provider]  ASPIRIN 81 PO Take by mouth.    [provider]  Cholecalciferol (VITAMIN D3 PO) Take by mouth.    [provider]  clonazePAM (KLONOPIN) 1 MG tablet Take 1 tablet (1 mg total) by mouth at bedtime. 11/29/18   Melina Schools, MD  dextromethorphan-guaiFENesin (ROBITUSSIN-DM) 10-100 MG/5ML liquid Take 10 mLs by mouth every 4 (four) hours as needed for cough.    [provider]  diltiazem (CARDIZEM CD) 120 MG 24 hr capsule TAKE 1 CAPSULE (120 MG TOTAL) BY MOUTH DAILY. PLEASE SCHEDULE APPT FOR FUTURE REFILLS. 1ST ATTEMPT 05/31/21   Camnitz, Ocie Doyne, MD  diltiazem [facility]) 120 MG 24 hr capsule Take 1 capsule (120 mg total) by mouth daily. 05/06/21   Camnitz, Ocie Doyne, MD  DULoxetine (CYMBALTA) 60 MG capsule Take 60 mg by mouth at bedtime.    [provider]  enoxaparin (LOVENOX) 40 MG/0.4ML injection Inject 0.4 mLs (40 mg total) into the skin daily for 10 days. 10 day supply 1 injection per day 01/26/21 02/05/21  Melina Schools, MD  gabapentin (NEURONTIN) 100 MG capsule Take 100 mg by mouth 2 (two) times daily.    [provider]  hydrALAZINE (APRESOLINE) 25 MG tablet Take 25 mg by mouth daily. Additional 25 mg if needed for high pressure    [provider]  Loperamide-Simethicone (ANTI-DIARRHEAL PLUS ANTI-GAS PO) Take 2 tablets  by mouth daily as needed (gas and diarrhea).    [provider]  methocarbamol (ROBAXIN) 500 MG tablet 1 tablet as needed for muscle spasms 01/30/21   [provider]  metoprolol tartrate  (LOPRESSOR) 25 MG tablet Take 0.5 tablets (12.5 mg total) by mouth 2 (two) times daily. 07/04/19   Dixie Dials, MD  nitroGLYCERIN (NITROSTAT) 0.4 MG SL tablet Place 1 tablet (0.4 mg total) under the tongue every 5 (five) minutes x 3 doses as needed for chest pain. 08/24/16   Eugenie Filler, MD  ondansetron (ZOFRAN) 4 MG tablet Take 1 tablet (4 mg total) by mouth every 8 (eight) hours as needed for nausea or vomiting. 01/26/21   Melina Schools, MD  oxyCODONE-acetaminophen (PERCOCET) 10-325 MG tablet Take 1 tablet by mouth 4 (four) times daily as needed. 02/01/21   [provider]  pantoprazole (PROTONIX) 40 MG tablet Take 1 tablet (40 mg total) by mouth 2 (two) times daily. Patient taking differently: Take 40 mg by mouth daily. 02/20/20   Harold Hedge, MD  traMADol Veatrice Bourbon) 50 MG tablet tramadol 50 mg tablet    [provider]  Vitamin D, Ergocalciferol, (DRISDOL) 1.25 MG (50000 UNIT) CAPS capsule Take 50,000 Units by mouth every Wednesday. 12/03/19   [provider]    Allergies    Midazolam hcl, Prednisone, Statins, Ace inhibitors, Amoxicillin, Nsaids, and Sulfa antibiotics  Review of Systems   Review of Systems  Constitutional:  Positive for chills and fever.  HENT:  Negative for facial swelling and trouble swallowing.   Eyes:  Negative for photophobia and visual disturbance.  Respiratory:  Positive for cough, shortness of breath and wheezing.   Cardiovascular:  Negative for chest pain and palpitations.  Gastrointestinal:  Negative for abdominal pain, nausea and vomiting.  Endocrine: Negative for polydipsia and polyuria.  Genitourinary:  Negative for difficulty urinating and hematuria.  Musculoskeletal:  Negative for gait problem and joint swelling.  Skin:  Negative for pallor and rash.  Neurological:  Negative for syncope and headaches.  Psychiatric/Behavioral:  Negative for agitation and confusion.    Physical Exam Updated Vital Signs BP (!) 133/56 (BP  Location: Right Arm)   Pulse 83   Temp 99.3 F (37.4 C) (Oral)   Resp (!) 27   LMP 09/06/2005 Comment: tubal ligation  SpO2 93%   Physical Exam Vitals and nursing note reviewed.  Constitutional:      General: She is not in acute distress.    Appearance: Normal appearance.  HENT:     Head: Normocephalic and atraumatic.     Right Ear: External ear normal.     Left Ear: External ear normal.     Nose: Nose normal.     Mouth/Throat:     Mouth: Mucous membranes are moist.  Eyes:     General: No scleral icterus.       Right eye: No discharge.        Left eye: No discharge.  Cardiovascular:     Rate and Rhythm: Normal rate and regular rhythm.     Pulses: Normal pulses.     Heart sounds: Normal heart sounds.  Pulmonary:     Effort: Pulmonary effort is normal. Tachypnea present. No respiratory distress.     Breath sounds: Decreased breath sounds and wheezing present.     Comments: Mild conversational dyspnea. Abdominal:     General: Abdomen is flat.     Tenderness: There is no abdominal tenderness.  Musculoskeletal:  General: Normal range of motion.     Cervical back: Normal range of motion.     Right lower leg: No edema.     Left lower leg: No edema.  Skin:    General: Skin is warm and dry.     Capillary Refill: Capillary refill takes less than 2 seconds.  Neurological:     Mental Status: She is alert.  Psychiatric:        Mood and Affect: Mood normal.        Behavior: Behavior normal.    ED Results / Procedures / Treatments   Labs (all labs ordered are listed, but only abnormal results are displayed) Labs Reviewed  RESP PANEL BY RT-PCR (FLU A&B, COVID) ARPGX2 - Abnormal; Notable for the following components:      Result Value   Influenza A by PCR POSITIVE (*)    All other components within normal limits  BLOOD GAS, VENOUS - Abnormal; Notable for the following components:   Bicarbonate 28.5 (*)    Acid-Base Excess 3.6 (*)    All other components within normal  limits  COMPREHENSIVE METABOLIC PANEL - Abnormal; Notable for the following components:   Potassium 3.2 (*)    Glucose, Bld 133 (*)    Calcium 8.8 (*)    AST 97 (*)    ALT 82 (*)    All other components within normal limits  TROPONIN I (HIGH SENSITIVITY) - Abnormal; Notable for the following components:   Troponin I (High Sensitivity) 38 (*)    All other components within normal limits  BRAIN NATRIURETIC PEPTIDE  TROPONIN I (HIGH SENSITIVITY)    EKG EKG Interpretation  Date/Time:  Sunday November 13 2021 11:36:52 EST Ventricular Rate:  91 PR Interval:  162 QRS Duration: 94 QT Interval:  375 QTC Calculation: 462 R Axis:   84 Text Interpretation: Sinus rhythm Atrial premature complex Anterior infarct, old T wave inversion to lateral leads Interpretation limited secondary to artifact similar to prior tracing 7/20, 10/17 Confirmed by Wynona Dove ([id number]) on 11/13/2021 3:25:03 PM  Radiology DG Chest Portable 1 View  Result Date: 11/13/2021 CLINICAL DATA:  Chest congestion, cough, and shortness of breath for several weeks, worsened symptoms over last few days with new fever. History COPD, hypertension EXAM: PORTABLE CHEST 1 VIEW COMPARISON:  Portable exam 1200 hours compared to 01/19/2021 FINDINGS: Normal heart size, mediastinal contours, and pulmonary vascularity. Atherosclerotic calcification aorta. Lungs clear. No infiltrate, pleural effusion, or pneumothorax. Osseous demineralization with prior cervicothoracic fusion. IMPRESSION: No acute abnormalities. Aortic Atherosclerosis (ICD10-I70.0). Electronically Signed   By: Lavonia Dana M.D.   On: 11/13/2021 12:39    Procedures .Critical Care Performed by: Jeanell Sparrow, DO Authorized by: Jeanell Sparrow, DO   Critical care provider statement:    Critical care time (minutes):  33   Critical care time was exclusive of:  Separately billable procedures and treating other patients   Critical care was necessary to treat or prevent imminent  or life-threatening deterioration of the following conditions:  Respiratory failure   Critical care was time spent personally by me on the following activities:  Development of treatment plan with patient or surrogate, discussions with consultants, evaluation of patient's response to treatment, examination of patient, ordering and review of laboratory studies, ordering and review of radiographic studies, ordering and performing treatments and interventions, pulse oximetry, re-evaluation of patient's condition and review of old charts   Medications Ordered in ED Medications  aspirin tablet 325 mg (325 mg Oral Given  11/13/21 1443)  methylPREDNISolone sodium succinate (SOLU-MEDROL) 125 mg/2 mL injection 60 mg (has no administration in time range)  ipratropium-albuterol (DUONEB) 0.5-2.5 (3) MG/3ML nebulizer solution 3 mL (3 mLs Nebulization Given 11/13/21 1202)    ED Course  I have reviewed the triage vital signs and the nursing notes.  Pertinent labs & imaging results that were available during my care of the patient were reviewed by me and considered in my medical decision making (see chart for details).    MDM Rules/Calculators/A&P                           CC: cough dib  This patient complains of above; this involves an extensive number of treatment options and is a complaint that carries with it a high risk of complications and morbidity. Vital signs were reviewed. Serious etiologies considered.  Record review:   Previous records obtained and reviewed   Additional history obtained from sister Work up as above, notable for:  Labs & imaging results that were available during my care of the patient were reviewed by me and considered in my medical decision making.   I ordered imaging studies which included cxr and I independently visualized and interpreted imaging which showed stable  Flu A positive, cxr without evidence of pneumonia  Pt with likely COPD exacerbation, influenza may  be a trigger of this, pt continues to smoke cigarettes. She has troponin leak, EKG without acute ischemia, favor likely demand ischemia 2/2 hypoxia. She showed initial improvement in breathing after nebs but has since worsened, she is 85-87% on room air, ongoing wheezing on exam. Overall feels unwell. Placed on 2L [place], at this time plan for admission for hypoxia, flu a, copd exacerbation, NSTEMI. Given ASA. Pt agreeable.    Pt signed out to incoming team pending admission.     Counseled patient for approximately 3 minutes regarding smoking cessation. Smokes around 2 pks daily. Discussed risks of smoking and how they applied and affected their visit here today. Patient not ready to quit at this time, however will follow up with their primary doctor when they are.   CPT code: [phone]: intermediate counseling for smoking cessation    This chart was dictated using voice recognition software.  Despite best efforts to proofread,  errors can occur which can change the documentation meaning.  Final Clinical Impression(s) / ED Diagnoses Final diagnoses:  Hypoxia  COPD exacerbation ([place])  NSTEMI (non-ST elevated myocardial infarction) ([place])  Influenza A    Rx / DC Orders ED Discharge Orders     None        Jeanell Sparrow, DO 11/13/21 1548

## 2021-11-13 NOTE — ED Triage Notes (Signed)
PT c/o SOB with congestion and cough worsening x 3 days. Hx COPD.

## 2021-11-13 NOTE — ED Notes (Signed)
Provider spoke with sister, Weyman Pedro -- sister states she will pick up pt and transport to ED.

## 2021-11-13 NOTE — ED Notes (Signed)
Sister present - states she will take pt directly to ED.  Pt declines wheelchair.

## 2021-11-13 NOTE — H&P (Signed)
History and Physical    [name] HAF:[id number] DOB: 1956-02-08 DOA: 11/13/2021  PCP: Maurice Small, MD (Inactive)  Patient coming from: Home  Chief Complaint: cough and fever  HPI: [name] is a 65 y.o. female with medical history significant of COPD, HTN, chronic pain, SVT. Presenting with increased cough. She has a chronic cough d/t smoking/COPD. She reports and increased amount of coughing spells over the last 3 or 4 days. The cough is non-productive. She has tried robitussin DM, vicks, and APAP. None of those medications helped. She noticed she was developing fever last night (100.9). Since her symptoms did not resolve, this morning she decided to go to Urgent Care. They recommended that she come to the ED for evaluation.   ED Course: She was hypoxic on RA. CXR was negative. She was flu positive. She was given steroids and nebs. TRH was called for admission.   Review of Systems:  Review of systems is otherwise negative for all not mentioned in HPI.   PMHx Past Medical History:  Diagnosis Date   Acute coronary syndrome (HCC)    Acute exacerbation of chronic obstructive airways disease (HCC)    Acute renal failure syndrome (HCC)    Agoraphobia without history of panic disorder    Anginal pain ([place])    Anxiety    Arthritis    "qwhere" (04/03/2018)   Bilateral carpal tunnel syndrome 02/18/2016   Bilateral knee pain    Carpal tunnel syndrome of left wrist    Chest pain    Childhood asthma    Chronic bronchitis (HCC)    Chronic lower back pain    Colitis presumed infectious    COPD (chronic obstructive pulmonary disease) ([place])    Coronary artery disease CARDIOLOGIST- DR  Doylene Canard  (VISIT 03-30-11 W/ CHART)   NON-OBS. CAD   (STRESS TEST NOV. 2011   Depression    DJD (degenerative joint disease)    JOINT PAIN   Dyspnea    "anytime but mostly on exertion and smoking"   Dysrhythmia 2017   SVT   Elevated liver enzymes    Fibromyalgia    Frequency of urination    GERD  (gastroesophageal reflux disease)     W/ NEXIUM   Heart murmur    Hemorrhoids    High cholesterol    History of blood transfusion    "related to anemia" (04/03/2018)   History of carpal tunnel syndrome    Right   History of gastric ulcer 2004   Hyperlipidemia    Hypertension    IBS (irritable bowel syndrome)    Incisional pain s/p interstim implant 1st stage--- 12-07-11    left upper buttock-- pt states dressing clean dry and intact (on 12-08-11)   Influenza due to influenza A virus    Insomnia    Internal derangement of left knee    Iron deficiency anemia 2004   Lesion of liver    Leukocytosis    Lumbar pain    Nocturia    Non-productive cough    Numbness in both hands AT TIMES   OSA (obstructive sleep apnea)    "suppose to wear mask; I don't" (04/03/2018)   Osteoarthritis of left knee    PONV (postoperative nausea and vomiting)    Post laminectomy syndrome    Preinfarction syndrome (HCC)    Supraventricular tachycardia (HCC)    SVT (supraventricular tachycardia) ([place])    Tobacco user    Urge urinary incontinence     PSHx Past Surgical  History:  Procedure Laterality Date   ABDOMINAL EXPOSURE N/A 01/26/2021   Procedure: ABDOMINAL EXPOSURE;  Surgeon: Rosetta Posner, MD;  Location: [facility];  Service: Vascular;  Laterality: N/A;   ANTERIOR CERVICAL DECOMP/DISCECTOMY FUSION  2008   C4 - T1 ("screws from 1st OR came out")   ANTERIOR CERVICAL DECOMP/DISCECTOMY FUSION  2002   C4 - 7   ANTERIOR LATERAL LUMBAR FUSION WITH PERCUTANEOUS SCREW 2 LEVEL Left 11/27/2018   Procedure: XLIF Lumbar 3-5, posterior spinal fusion L3-5;  Surgeon: Melina Schools, MD;  Location: [place];  Service: Orthopedics;  Laterality: Left;  5.5 hrs for entire procedure   ANTERIOR LUMBAR FUSION N/A 01/26/2021   Procedure: ANTERIOR LUMBAR FUSION LUMBAR FIVE-SACRAL ONE WITH POSTERIOR SPINAL FUSION INTERBODY (EXTENSION OF PREVIOUS FUSION);  Surgeon: Melina Schools, MD;  Location: [place];  Service: Orthopedics;   Laterality: N/A;  6hrs Dr. Donnetta Hutching to do approach Tap Block with exparel   APPENDECTOMY  1982   BACK SURGERY     CARDIAC CATHETERIZATION  2003   CARDIAC CATHETERIZATION N/A 08/23/2016   Procedure: Left Heart Cath and Coronary Angiography;  Surgeon: Dixie Dials, MD;  Location: [facility];  Service: Cardiovascular;  Laterality: N/A;   CARPAL TUNNEL RELEASE Right    CATARACT EXTRACTION W/ INTRAOCULAR LENS  IMPLANT, BILATERAL  2016-2018   CHOLECYSTECTOMY OPEN  1982   COLONOSCOPY     CYSTO/ HOD/ BLADDER BX  2006   CYSTO/ HOD/ BLADDER BX/ FULGERATION  06-12-2011   EYE SURGERY     FINGER SURGERY Right 2001   "replaced worn cartilage on thumb w/tendons"   HYSTEROSCOPY WITH D & C  2005   INTERSTIM IMPLANT PLACEMENT  12/07/2011   Procedure: [place]rie Lyme IMPLANT FIRST STAGE;  Surgeon: [name], MD;  Location: [facility];  Service: Urology;  Laterality: Right;   INTERSTIM IMPLANT PLACEMENT  12/14/2011   Procedure: Barrie Lyme IMPLANT SECOND STAGE;  Surgeon: [name], MD;  Location: [facility];  Service: Urology;  Laterality: Right;  rad tech ok by vickie at [facility]   JOINT REPLACEMENT     LUMBAR LAMINECTOMY  2007   L3 - 5   TONSILLECTOMY AND ADENOIDECTOMY  1965   TOTAL KNEE ARTHROPLASTY Right 02/10/2020   Procedure: TOTAL KNEE ARTHROPLASTY;  Surgeon: Paralee Cancel, MD;  Location: [facility];  Service: Orthopedics;  Laterality: Right;  70 mins   TOTAL KNEE ARTHROPLASTY Left 07/20/2020   Procedure: TOTAL KNEE ARTHROPLASTY;  Surgeon: Paralee Cancel, MD;  Location: [facility];  Service: Orthopedics;  Laterality: Left;   TUBAL LIGATION     UPPER GI ENDOSCOPY     WRIST SURGERY Left    TFCC ("tendon repair")    SocHx  reports that she has been smoking cigarettes. She has a 86.00 pack-year smoking history. She has never used smokeless tobacco. She reports that she does not currently use alcohol. She reports that she does not currently use drugs after having used  the following drugs: Marijuana.  Allergies  Allergen Reactions   Midazolam Hcl Other (See Comments)    HYPER   Prednisone Other (See Comments)    HYPER, depressed, moody   Statins Other (See Comments)    Causes muscle weakness and joint pain   Ace Inhibitors Other (See Comments)    DECREASES HEARTRATE   Amoxicillin Diarrhea    Did it involve swelling of the face/tongue/throat, SOB, or low BP? No Did it involve sudden or severe rash/hives, skin peeling, or any  reaction on the inside of your mouth or nose? No Did you need to seek medical attention at a hospital or doctor's office? No When did it last happen?  ~2019 or so If all above answers are "NO", may proceed with cephalosporin use.    Nsaids Other (See Comments)    AVOIDS; HX GASTRIC ULCER   Sulfa Antibiotics Rash and Other (See Comments)    JOINT PAIN    FamHx Family History  Problem Relation Age of Onset   Hypertension Father    Heart disease Father    Kidney failure Father    Breast cancer Mother    Hypertension Mother    Coronary artery disease Mother    Alzheimer's disease Mother    Hypertension Brother    Hypertension Sister    Hypertension Daughter     Prior to Admission medications   Medication Sig Start Date End Date Taking? Authorizing Provider  albuterol (PROVENTIL HFA;VENTOLIN HFA) 108 (90 BASE) MCG/ACT inhaler Inhale 2 puffs into the lungs every 6 (six) hours as needed for wheezing or shortness of breath.    [provider]  alendronate (FOSAMAX) 70 MG tablet Take 70 mg by mouth once a week. Take with a full glass of water on an empty stomach.    [provider]  ASPIRIN 81 PO Take by mouth.    [provider]  Cholecalciferol (VITAMIN D3 PO) Take by mouth.    [provider]  clonazePAM (KLONOPIN) 1 MG tablet Take 1 tablet (1 mg total) by mouth at bedtime. 11/29/18   Melina Schools, MD  dextromethorphan-guaiFENesin (ROBITUSSIN-DM) 10-100 MG/5ML liquid Take 10 mLs by  mouth every 4 (four) hours as needed for cough.    [provider]  diltiazem (CARDIZEM CD) 120 MG 24 hr capsule TAKE 1 CAPSULE (120 MG TOTAL) BY MOUTH DAILY. PLEASE SCHEDULE APPT FOR FUTURE REFILLS. 1ST ATTEMPT 05/31/21   Camnitz, Ocie Doyne, MD  diltiazem [facility]) 120 MG 24 hr capsule Take 1 capsule (120 mg total) by mouth daily. 05/06/21   Camnitz, Ocie Doyne, MD  DULoxetine (CYMBALTA) 60 MG capsule Take 60 mg by mouth at bedtime.    [provider]  enoxaparin (LOVENOX) 40 MG/0.4ML injection Inject 0.4 mLs (40 mg total) into the skin daily for 10 days. 10 day supply 1 injection per day 01/26/21 02/05/21  Melina Schools, MD  gabapentin (NEURONTIN) 100 MG capsule Take 100 mg by mouth 2 (two) times daily.    [provider]  hydrALAZINE (APRESOLINE) 25 MG tablet Take 25 mg by mouth daily. Additional 25 mg if needed for high pressure    [provider]  Loperamide-Simethicone (ANTI-DIARRHEAL PLUS ANTI-GAS PO) Take 2 tablets by mouth daily as needed (gas and diarrhea).    [provider]  methocarbamol (ROBAXIN) 500 MG tablet 1 tablet as needed for muscle spasms 01/30/21   [provider]  metoprolol tartrate (LOPRESSOR) 25 MG tablet Take 0.5 tablets (12.5 mg total) by mouth 2 (two) times daily. 07/04/19   Dixie Dials, MD  nitroGLYCERIN (NITROSTAT) 0.4 MG SL tablet Place 1 tablet (0.4 mg total) under the tongue every 5 (five) minutes x 3 doses as needed for chest pain. 08/24/16   Eugenie Filler, MD  ondansetron (ZOFRAN) 4 MG tablet Take 1 tablet (4 mg total) by mouth every 8 (eight) hours as needed for nausea or vomiting. 01/26/21   Melina Schools, MD  oxyCODONE-acetaminophen (PERCOCET) 10-325 MG tablet Take 1 tablet by mouth 4 (four) times daily  as needed. 02/01/21   [provider]  pantoprazole (PROTONIX) 40 MG tablet Take 1 tablet (40 mg total) by mouth 2 (two) times daily. Patient taking differently: Take 40 mg by mouth daily. 02/20/20    Harold Hedge, MD  traMADol Veatrice Bourbon) 50 MG tablet tramadol 50 mg tablet    [provider]  Vitamin D, Ergocalciferol, (DRISDOL) 1.25 MG (50000 UNIT) CAPS capsule Take 50,000 Units by mouth every Wednesday. 12/03/19   [provider]    Physical Exam: Vitals:   11/13/21 1127 11/13/21 1202 11/13/21 1309  BP: (!) 148/82  (!) 133/56  Pulse: 94  83  Resp: (!) 22  (!) 27  Temp: 99.3 F (37.4 C)    TempSrc: Oral    SpO2: 94% 96% 93%    General: 65 y.o. female resting in bed in NAD Eyes: PERRL, normal sclera ENMT: Nares patent w/o discharge, orophaynx clear, dentition normal, ears w/o discharge/lesions/ulcers Neck: Supple, trachea midline Cardiovascular: RRR, +S1, S2, no m/g/r, equal pulses throughout Respiratory: upper airway tightness, no r/r; slightly increased WOB on 2L GI: BS+, NDNT, no masses noted, no organomegaly noted MSK: No e/c/c Neuro: A&O x 3, no focal deficits Psyc: Appropriate interaction and affect, calm/cooperative  Labs on Admission: I have personally reviewed following labs and imaging studies  CBC: No results for input(s): WBC, NEUTROABS, HGB, HCT, MCV, PLT in the last 168 hours. Basic Metabolic Panel: Recent Labs  Lab 11/13/21 1157  NA 137  K 3.2*  CL 99  CO2 29  GLUCOSE 133*  BUN 13  CREATININE 0.88  CALCIUM 8.8*   GFR: CrCl cannot be calculated (Unknown ideal weight.). Liver Function Tests: Recent Labs  Lab 11/13/21 1157  AST 97*  ALT 82*  ALKPHOS 120  BILITOT 0.9  PROT 7.8  ALBUMIN 4.6   No results for input(s): LIPASE, AMYLASE in the last 168 hours. No results for input(s): AMMONIA in the last 168 hours. Coagulation Profile: No results for input(s): INR, PROTIME in the last 168 hours. Cardiac Enzymes: No results for input(s): CKTOTAL, CKMB, CKMBINDEX, TROPONINI in the last 168 hours. BNP (last 3 results) No results for input(s): PROBNP in the last 8760 hours. HbA1C: No results for input(s): HGBA1C in the last 72  hours. CBG: No results for input(s): GLUCAP in the last 168 hours. Lipid Profile: No results for input(s): CHOL, HDL, LDLCALC, TRIG, CHOLHDL, LDLDIRECT in the last 72 hours. Thyroid Function Tests: No results for input(s): TSH, T4TOTAL, FREET4, T3FREE, THYROIDAB in the last 72 hours. Anemia Panel: No results for input(s): VITAMINB12, FOLATE, FERRITIN, TIBC, IRON, RETICCTPCT in the last 72 hours. Urine analysis:    Component Value Date/Time   COLORURINE YELLOW 01/19/2021 [place] 01/19/2021 1139   LABSPEC 1.023 01/19/2021 1139   PHURINE 5.0 01/19/2021 1139   GLUCOSEU NEGATIVE 01/19/2021 1139   HGBUR NEGATIVE 01/19/2021 1139   BILIRUBINUR NEGATIVE 01/19/2021 1139   KETONESUR NEGATIVE 01/19/2021 1139   PROTEINUR 100 (A) 01/19/2021 1139   UROBILINOGEN 0.2 09/17/2008 2342   NITRITE NEGATIVE 01/19/2021 1139   LEUKOCYTESUR NEGATIVE 01/19/2021 1139    Radiological Exams on Admission: DG Chest Portable 1 View  Result Date: 11/13/2021 CLINICAL DATA:  Chest congestion, cough, and shortness of breath for several weeks, worsened symptoms over last few days with new fever. History COPD, hypertension EXAM: PORTABLE CHEST 1 VIEW COMPARISON:  Portable exam 1200 hours compared to 01/19/2021 FINDINGS: Normal heart size, mediastinal contours, and pulmonary vascularity. Atherosclerotic calcification aorta. Lungs clear.  No infiltrate, pleural effusion, or pneumothorax. Osseous demineralization with prior cervicothoracic fusion. IMPRESSION: No acute abnormalities. Aortic Atherosclerosis (ICD10-I70.0). Electronically Signed   By: Lavonia Dana M.D.   On: 11/13/2021 12:39    EKG: Independently reviewed. Sinus tachy, no st elevation  Assessment/Plan Influenza A Acute hypoxic respiratory failure secondary to above COPD     - place in obs, tele     - nebs, steroids, tamiflu, guaifenesin     - wean O2 as able  Tobacco abuse     - counsel against further use  HTN SVT     - resume home  regimen when confirmed  Chronic pain     - PRNs available  Hypokalemia     - replace K+, check Mg2+  Elevated LFTs     - check hepatitis panel     - check RUQ US  DVT prophylaxis: lovenox  Code Status: FULL  Family Communication: None at bedside  Consults called: None   Status is: Observation  The patient remains OBS appropriate and will d/c before 2 midnights.  Jonnie Finner DO [facility]  If 7PM-7AM, please contact night-coverage [email or web address]  11/13/2021, 3:59 PM

## 2021-11-13 NOTE — ED Triage Notes (Signed)
C/O allergy sxs "for a long time", states has been SOB with cough and sinus discomfort x "weeks".  Fever up to 100.9 last night. Pt states her normal SaO2 = approx 93%RA.  Pt placed on O2 via [place] @ 2L with SaO2 up to 91%.  Provider notified.

## 2021-11-13 NOTE — ED Provider Notes (Signed)
[facility]    CSN: [id number] Arrival date & time: 11/13/21  0076      History   Chief Complaint Chief Complaint  Patient presents with   Shortness of Breath   Cough    HPI [name] is a 65 y.o. female.   Patient here today for evaluation of chest congestion, cough and shortness of breath that actually started several weeks ago.  She states over the last few days she has developed worsening symptoms with fever as well.  She does have known COPD.    The history is provided by the patient.  Shortness of Breath Associated symptoms: cough, fever and wheezing   Associated symptoms: no abdominal pain, no ear pain and no vomiting   Cough Associated symptoms: fever, shortness of breath and wheezing   Associated symptoms: no chills, no ear pain and no eye discharge    Past Medical History:  Diagnosis Date   Acute coronary syndrome (HCC)    Acute exacerbation of chronic obstructive airways disease (HCC)    Acute renal failure syndrome (HCC)    Agoraphobia without history of panic disorder    Anginal pain ([place])    Anxiety    Arthritis    "qwhere" (04/03/2018)   Bilateral carpal tunnel syndrome 02/18/2016   Bilateral knee pain    Carpal tunnel syndrome of left wrist    Chest pain    Childhood asthma    Chronic bronchitis (HCC)    Chronic lower back pain    Colitis presumed infectious    COPD (chronic obstructive pulmonary disease) ([place])    Coronary artery disease CARDIOLOGIST- DR  Doylene Canard  (VISIT 03-30-11 W/ CHART)   NON-OBS. CAD   (STRESS TEST NOV. 2011   Depression    DJD (degenerative joint disease)    JOINT PAIN   Dyspnea    "anytime but mostly on exertion and smoking"   Dysrhythmia 2017   SVT   Elevated liver enzymes    Fibromyalgia    Frequency of urination    GERD (gastroesophageal reflux disease)     W/ NEXIUM   Heart murmur    Hemorrhoids    High cholesterol    History of blood transfusion    "related to anemia" (04/03/2018)   History  of carpal tunnel syndrome    Right   History of gastric ulcer 2004   Hyperlipidemia    Hypertension    IBS (irritable bowel syndrome)    Incisional pain s/p interstim implant 1st stage--- 12-07-11    left upper buttock-- pt states dressing clean dry and intact (on 12-08-11)   Influenza due to influenza A virus    Insomnia    Internal derangement of left knee    Iron deficiency anemia 2004   Lesion of liver    Leukocytosis    Lumbar pain    Nocturia    Non-productive cough    Numbness in both hands AT TIMES   OSA (obstructive sleep apnea)    "suppose to wear mask; I don't" (04/03/2018)   Osteoarthritis of left knee    PONV (postoperative nausea and vomiting)    Post laminectomy syndrome    Preinfarction syndrome (HCC)    Supraventricular tachycardia (HCC)    SVT (supraventricular tachycardia) ([place])    Tobacco user    Urge urinary incontinence     Patient Active Problem List   Diagnosis Date Noted   S/P lumbar fusion 01/26/2021   Encounter for orthopedic follow-up care 10/25/2020  Pain in left knee 08/17/2020   Colitis presumed infectious 02/17/2020   Lesion of liver 02/17/2020   History of total knee arthroplasty 02/10/2020   Osteoarthritis of right knee 10/30/2019   Osteoarthritis of left knee 10/30/2019   Internal derangement of left knee 07/07/2019   Bilateral knee pain 03/21/2019   Low back pain 27/02/5008   Complication of surgical procedure 06/17/2018   Degeneration of lumbar intervertebral disc 06/17/2018   Degenerative spondylolisthesis 06/17/2018   Acute coronary syndrome ([place]) 04/02/2018   Hyperlipidemia 08/06/2017   Influenza due to influenza A virus 01/01/2017   Acute exacerbation of chronic obstructive airways disease ([place]) 01/01/2017   Acute renal failure syndrome ([place]) 01/01/2017   Dehydration 01/01/2017   Preinfarction syndrome ([place]) 10/14/2016   Chest pain 08/22/2016   Supraventricular tachycardia ([place]) 08/22/2016   Obstructive sleep apnea  syndrome    Hypertensive disorder    Gastroesophageal reflux disease    Depressive disorder    Coronary arteriosclerosis    Anxiety    Pain in the chest    Hypokalemia    Carpal tunnel syndrome of left wrist 02/18/2016   Leukocytosis 05/26/2014   Tobacco user 05/26/2014   Urge incontinence of urine 12/14/2011    Past Surgical History:  Procedure Laterality Date   ABDOMINAL EXPOSURE N/A 01/26/2021   Procedure: ABDOMINAL EXPOSURE;  Surgeon: Rosetta Posner, MD;  Location: [place] [facility];  Service: Vascular;  Laterality: N/A;   ANTERIOR CERVICAL DECOMP/DISCECTOMY FUSION  2008   C4 - T1 ("screws from 1st OR came out")   ANTERIOR CERVICAL DECOMP/DISCECTOMY FUSION  2002   C4 - 7   ANTERIOR LATERAL LUMBAR FUSION WITH PERCUTANEOUS SCREW 2 LEVEL Left 11/27/2018   Procedure: XLIF Lumbar 3-5, posterior spinal fusion L3-5;  Surgeon: Melina Schools, MD;  Location: [place];  Service: Orthopedics;  Laterality: Left;  5.5 hrs for entire procedure   ANTERIOR LUMBAR FUSION N/A 01/26/2021   Procedure: ANTERIOR LUMBAR FUSION LUMBAR FIVE-SACRAL ONE WITH POSTERIOR SPINAL FUSION INTERBODY (EXTENSION OF PREVIOUS FUSION);  Surgeon: Melina Schools, MD;  Location: [place];  Service: Orthopedics;  Laterality: N/A;  6hrs Dr. Donnetta Hutching to do approach Tap Block with exparel   APPENDECTOMY  1982   BACK SURGERY     CARDIAC CATHETERIZATION  2003   CARDIAC CATHETERIZATION N/A 08/23/2016   Procedure: Left Heart Cath and Coronary Angiography;  Surgeon: Dixie Dials, MD;  Location: [facility];  Service: Cardiovascular;  Laterality: N/A;   CARPAL TUNNEL RELEASE Right    CATARACT EXTRACTION W/ INTRAOCULAR LENS  IMPLANT, BILATERAL  2016-2018   CHOLECYSTECTOMY OPEN  1982   COLONOSCOPY     CYSTO/ HOD/ BLADDER BX  2006   CYSTO/ HOD/ BLADDER BX/ FULGERATION  06-12-2011   EYE SURGERY     FINGER SURGERY Right 2001   "replaced worn cartilage on thumb w/tendons"   HYSTEROSCOPY WITH D & C  2005   INTERSTIM IMPLANT PLACEMENT  12/07/2011    Procedure: Barrie Lyme IMPLANT FIRST STAGE;  Surgeon: [name], MD;  Location: [facility];  Service: Urology;  Laterality: Right;   INTERSTIM IMPLANT PLACEMENT  12/14/2011   Procedure: Barrie Lyme IMPLANT SECOND STAGE;  Surgeon: [name], MD;  Location: [facility];  Service: Urology;  Laterality: Right;  rad tech ok by vickie at [facility]   JOINT REPLACEMENT     LUMBAR LAMINECTOMY  2007   L3 - 5   TONSILLECTOMY AND ADENOIDECTOMY  1965   TOTAL KNEE ARTHROPLASTY Right  02/10/2020   Procedure: TOTAL KNEE ARTHROPLASTY;  Surgeon: Paralee Cancel, MD;  Location: [facility];  Service: Orthopedics;  Laterality: Right;  70 mins   TOTAL KNEE ARTHROPLASTY Left 07/20/2020   Procedure: TOTAL KNEE ARTHROPLASTY;  Surgeon: Paralee Cancel, MD;  Location: [facility];  Service: Orthopedics;  Laterality: Left;   TUBAL LIGATION     UPPER GI ENDOSCOPY     WRIST SURGERY Left    TFCC ("tendon repair")    OB History   No obstetric history on file.      Home Medications    Prior to Admission medications   Medication Sig Start Date End Date Taking? Authorizing Provider  alendronate (FOSAMAX) 70 MG tablet Take 70 mg by mouth once a week. Take with a full glass of water on an empty stomach.   Yes [provider]  ASPIRIN 81 PO Take by mouth.   Yes [provider]  Cholecalciferol (VITAMIN D3 PO) Take by mouth.   Yes [provider]  clonazePAM (KLONOPIN) 1 MG tablet Take 1 tablet (1 mg total) by mouth at bedtime. 11/29/18  Yes Melina Schools, MD  dextromethorphan-guaiFENesin (ROBITUSSIN-DM) 10-100 MG/5ML liquid Take 10 mLs by mouth every 4 (four) hours as needed for cough.   Yes [provider]  diltiazem (CARDIZEM CD) 120 MG 24 hr capsule TAKE 1 CAPSULE (120 MG TOTAL) BY MOUTH DAILY. PLEASE SCHEDULE APPT FOR FUTURE REFILLS. 1ST ATTEMPT 05/31/21  Yes Camnitz, Ocie Doyne, MD  DULoxetine (CYMBALTA) 60 MG capsule Take 60 mg by mouth at bedtime.   Yes  [provider]  hydrALAZINE (APRESOLINE) 25 MG tablet Take 25 mg by mouth daily. Additional 25 mg if needed for high pressure   Yes [provider]  pantoprazole (PROTONIX) 40 MG tablet Take 1 tablet (40 mg total) by mouth 2 (two) times daily. Patient taking differently: Take 40 mg by mouth daily. 02/20/20  Yes Harold Hedge, MD  albuterol (PROVENTIL HFA;VENTOLIN HFA) 108 (90 BASE) MCG/ACT inhaler Inhale 2 puffs into the lungs every 6 (six) hours as needed for wheezing or shortness of breath.    [provider]  diltiazem (TIAZAC) 120 MG 24 hr capsule Take 1 capsule (120 mg total) by mouth daily. 05/06/21   Camnitz, Will Hassell Done, MD  enoxaparin (LOVENOX) 40 MG/0.4ML injection Inject 0.4 mLs (40 mg total) into the skin daily for 10 days. 10 day supply 1 injection per day 01/26/21 02/05/21  Melina Schools, MD  gabapentin (NEURONTIN) 100 MG capsule Take 100 mg by mouth 2 (two) times daily.    [provider]  Loperamide-Simethicone (ANTI-DIARRHEAL PLUS ANTI-GAS PO) Take 2 tablets by mouth daily as needed (gas and diarrhea).    [provider]  methocarbamol (ROBAXIN) 500 MG tablet 1 tablet as needed for muscle spasms 01/30/21   [provider]  metoprolol tartrate (LOPRESSOR) 25 MG tablet Take 0.5 tablets (12.5 mg total) by mouth 2 (two) times daily. 07/04/19   Dixie Dials, MD  nitroGLYCERIN (NITROSTAT) 0.4 MG SL tablet Place 1 tablet (0.4 mg total) under the tongue every 5 (five) minutes x 3 doses as needed for chest pain. 08/24/16   Eugenie Filler, MD  ondansetron (ZOFRAN) 4 MG tablet Take 1 tablet (4 mg total) by mouth every 8 (eight) hours as needed for nausea or vomiting. 01/26/21   Melina Schools, MD  oxyCODONE-acetaminophen (PERCOCET) 10-325 MG tablet Take 1 tablet by mouth 4 (four) times daily as needed. 02/01/21   [provider]  traMADol (  ULTRAM) 50 MG tablet tramadol 50 mg tablet    [provider]  Vitamin D, Ergocalciferol,  (DRISDOL) 1.25 MG (50000 UNIT) CAPS capsule Take 50,000 Units by mouth every Wednesday. 12/03/19   [provider]    Family History Family History  Problem Relation Age of Onset   Hypertension Father    Heart disease Father    Kidney failure Father    Breast cancer Mother    Hypertension Mother    Coronary artery disease Mother    Alzheimer's disease Mother    Hypertension Brother    Hypertension Sister    Hypertension Daughter     Social History Social History   Tobacco Use   Smoking status: Every Day    Packs/day: 2.00    Years: 43.00    Pack years: 86.00    Types: Cigarettes   Smokeless tobacco: Never  Vaping Use   Vaping Use: Never used  Substance Use Topics   Alcohol use: Not Currently    Alcohol/week: 0.0 standard drinks    Comment: 04/03/2018 "nothing in years"   Drug use: Not Currently    Types: Marijuana     Allergies   Midazolam hcl, Prednisone, Statins, Ace inhibitors, Amoxicillin, Nsaids, and Sulfa antibiotics   Review of Systems Review of Systems  Constitutional:  Positive for fever. Negative for chills.  HENT:  Positive for congestion. Negative for ear pain and sinus pressure.   Eyes:  Negative for discharge and redness.  Respiratory:  Positive for cough, shortness of breath and wheezing.   Gastrointestinal:  Positive for nausea. Negative for abdominal pain, diarrhea and vomiting.  Genitourinary:  Positive for vaginal bleeding and vaginal discharge.    Physical Exam Triage Vital Signs ED Triage Vitals  Enc Vitals Group     BP 11/13/21 1005 130/73     Pulse Rate 11/13/21 1001 (!) 102     Resp 11/13/21 1001 (!) 36     Temp --      Temp src --      SpO2 11/13/21 1001 (!) 89 %     Weight --      Height --      Head Circumference --      Peak Flow --      Pain Score --      Pain Loc --      Pain Edu? --      Excl. in [place]? --    No data found.  Updated Vital Signs BP 130/73   Pulse (!) 102   Resp (!) 30   LMP 09/06/2005  Comment: tubal ligation  SpO2 96%   Physical Exam Vitals and nursing note reviewed.  Constitutional:      General: She is not in acute distress.    Appearance: Normal appearance. She is not ill-appearing.  HENT:     Head: Normocephalic and atraumatic.  Eyes:     Conjunctiva/sclera: Conjunctivae normal.  Cardiovascular:     Rate and Rhythm: Normal rate and regular rhythm.     Heart sounds: Normal heart sounds. No murmur heard. Pulmonary:     Effort: Respiratory distress (increased respiratory rate prior to oxygen supplementation) present.     Comments: Breath sounds diminished throughout Neurological:     Mental Status: She is alert.  Psychiatric:        Mood and Affect: Mood normal.        Behavior: Behavior normal.        Thought Content: Thought content normal.  UC Treatments / Results  Labs (all labs ordered are listed, but only abnormal results are displayed) Labs Reviewed - No data to display  EKG   Radiology No results found.  Procedures Procedures (including critical care time)  Medications Ordered in UC Medications - No data to display  Initial Impression / Assessment and Plan / UC Course  I have reviewed the triage vital signs and the nursing notes.  Pertinent labs & imaging results that were available during my care of the patient were reviewed by me and considered in my medical decision making (see chart for details).    Given mild hypoxia, oxygen requirement and fever recommended further evaluation in the ED. Patient is agreeable to same and sister will drive her there.   Final Clinical Impressions(s) / UC Diagnoses   Final diagnoses:  COPD exacerbation [facility])   Discharge Instructions   None    ED Prescriptions   None    PDMP not reviewed this encounter.   Francene Finders, PA-C 11/13/21 1055

## 2021-11-14 ENCOUNTER — Observation Stay (HOSPITAL_COMMUNITY): Payer: Medicare Other

## 2021-11-14 DIAGNOSIS — I2510 Atherosclerotic heart disease of native coronary artery without angina pectoris: Secondary | ICD-10-CM | POA: Diagnosis present

## 2021-11-14 DIAGNOSIS — G4733 Obstructive sleep apnea (adult) (pediatric): Secondary | ICD-10-CM | POA: Diagnosis present

## 2021-11-14 DIAGNOSIS — G4700 Insomnia, unspecified: Secondary | ICD-10-CM | POA: Diagnosis present

## 2021-11-14 DIAGNOSIS — Z20822 Contact with and (suspected) exposure to covid-19: Secondary | ICD-10-CM | POA: Diagnosis present

## 2021-11-14 DIAGNOSIS — F32A Depression, unspecified: Secondary | ICD-10-CM

## 2021-11-14 DIAGNOSIS — F419 Anxiety disorder, unspecified: Secondary | ICD-10-CM | POA: Diagnosis present

## 2021-11-14 DIAGNOSIS — M797 Fibromyalgia: Secondary | ICD-10-CM | POA: Diagnosis present

## 2021-11-14 DIAGNOSIS — E876 Hypokalemia: Secondary | ICD-10-CM

## 2021-11-14 DIAGNOSIS — R0602 Shortness of breath: Secondary | ICD-10-CM | POA: Diagnosis not present

## 2021-11-14 DIAGNOSIS — E7800 Pure hypercholesterolemia, unspecified: Secondary | ICD-10-CM | POA: Diagnosis present

## 2021-11-14 DIAGNOSIS — G8929 Other chronic pain: Secondary | ICD-10-CM | POA: Diagnosis present

## 2021-11-14 DIAGNOSIS — K7689 Other specified diseases of liver: Secondary | ICD-10-CM | POA: Diagnosis not present

## 2021-11-14 DIAGNOSIS — I471 Supraventricular tachycardia: Secondary | ICD-10-CM

## 2021-11-14 DIAGNOSIS — J441 Chronic obstructive pulmonary disease with (acute) exacerbation: Secondary | ICD-10-CM

## 2021-11-14 DIAGNOSIS — J9601 Acute respiratory failure with hypoxia: Secondary | ICD-10-CM

## 2021-11-14 DIAGNOSIS — M545 Low back pain, unspecified: Secondary | ICD-10-CM | POA: Diagnosis present

## 2021-11-14 DIAGNOSIS — Z6833 Body mass index (BMI) 33.0-33.9, adult: Secondary | ICD-10-CM | POA: Diagnosis not present

## 2021-11-14 DIAGNOSIS — J101 Influenza due to other identified influenza virus with other respiratory manifestations: Secondary | ICD-10-CM | POA: Diagnosis present

## 2021-11-14 DIAGNOSIS — E860 Dehydration: Secondary | ICD-10-CM | POA: Diagnosis present

## 2021-11-14 DIAGNOSIS — R7989 Other specified abnormal findings of blood chemistry: Secondary | ICD-10-CM | POA: Diagnosis present

## 2021-11-14 DIAGNOSIS — I10 Essential (primary) hypertension: Secondary | ICD-10-CM | POA: Diagnosis present

## 2021-11-14 DIAGNOSIS — Z720 Tobacco use: Secondary | ICD-10-CM

## 2021-11-14 DIAGNOSIS — J440 Chronic obstructive pulmonary disease with acute lower respiratory infection: Secondary | ICD-10-CM | POA: Diagnosis present

## 2021-11-14 DIAGNOSIS — E669 Obesity, unspecified: Secondary | ICD-10-CM | POA: Diagnosis present

## 2021-11-14 DIAGNOSIS — R059 Cough, unspecified: Secondary | ICD-10-CM | POA: Diagnosis not present

## 2021-11-14 DIAGNOSIS — J209 Acute bronchitis, unspecified: Secondary | ICD-10-CM | POA: Diagnosis present

## 2021-11-14 DIAGNOSIS — K589 Irritable bowel syndrome without diarrhea: Secondary | ICD-10-CM | POA: Diagnosis present

## 2021-11-14 DIAGNOSIS — K219 Gastro-esophageal reflux disease without esophagitis: Secondary | ICD-10-CM | POA: Diagnosis present

## 2021-11-14 LAB — COMPREHENSIVE METABOLIC PANEL
ALT: 70 U/L — ABNORMAL HIGH (ref 0–44)
AST: 73 U/L — ABNORMAL HIGH (ref 15–41)
Albumin: 4.1 g/dL (ref 3.5–5.0)
Alkaline Phosphatase: 98 U/L (ref 38–126)
Anion gap: 12 (ref 5–15)
BUN: 22 mg/dL (ref 8–23)
CO2: 26 mmol/L (ref 22–32)
Calcium: 9.2 mg/dL (ref 8.9–10.3)
Chloride: 100 mmol/L (ref 98–111)
Creatinine, Ser: 1.11 mg/dL — ABNORMAL HIGH (ref 0.44–1.00)
GFR, Estimated: 54 mL/min — ABNORMAL LOW (ref >60)
Glucose, Bld: 188 mg/dL — ABNORMAL HIGH (ref 70–99)
Potassium: 3.4 mmol/L — ABNORMAL LOW (ref 3.5–5.1)
Sodium: 138 mmol/L (ref 135–145)
Total Bilirubin: 0.6 mg/dL (ref 0.3–1.2)
Total Protein: 7.3 g/dL (ref 6.5–8.1)

## 2021-11-14 LAB — CBC
HCT: 44.7 % (ref 36.0–46.0)
Hemoglobin: 14.6 g/dL (ref 12.0–15.0)
MCH: 28.7 pg (ref 26.0–34.0)
MCHC: 32.7 g/dL (ref 30.0–36.0)
MCV: 88 fL (ref 80.0–100.0)
Platelets: 207 10*3/uL (ref 150–400)
RBC: 5.08 MIL/uL (ref 3.87–5.11)
RDW: 13.8 % (ref 11.5–15.5)
WBC: 8.6 10*3/uL (ref 4.0–10.5)
nRBC: 0 % (ref 0.0–0.2)

## 2021-11-14 LAB — HEPATITIS PANEL, ACUTE
HCV Ab: NONREACTIVE
Hep A IgM: NONREACTIVE
Hep B C IgM: NONREACTIVE
Hepatitis B Surface Ag: NONREACTIVE

## 2021-11-14 LAB — CREATININE, SERUM
Creatinine, Ser: 1.01 mg/dL — ABNORMAL HIGH (ref 0.44–1.00)
GFR, Estimated: >60 mL/min (ref >60)

## 2021-11-14 LAB — HIV ANTIBODY (ROUTINE TESTING W REFLEX): HIV Screen 4th Generation wRfx: NONREACTIVE

## 2021-11-14 LAB — TROPONIN I (HIGH SENSITIVITY)
Troponin I (High Sensitivity): 29 ng/L — ABNORMAL HIGH (ref <18)
Troponin I (High Sensitivity): 32 ng/L — ABNORMAL HIGH (ref <18)

## 2021-11-14 LAB — MAGNESIUM: Magnesium: 2 mg/dL (ref 1.7–2.4)

## 2021-11-14 MED ORDER — LORATADINE 10 MG PO TABS
10.0000 mg | ORAL_TABLET | Freq: Every day | ORAL | Status: DC
  Administered 2021-11-14 – 2021-11-19 (×6): 10 mg via ORAL
  Filled 2021-11-14 (×6): qty 1

## 2021-11-14 MED ORDER — FLUTICASONE PROPIONATE 50 MCG/ACT NA SUSP
2.0000 | Freq: Every day | NASAL | Status: DC
  Administered 2021-11-14 – 2021-11-18 (×5): 2 via NASAL
  Filled 2021-11-14: qty 16

## 2021-11-14 MED ORDER — BUDESONIDE 0.5 MG/2ML IN SUSP
0.5000 mg | Freq: Two times a day (BID) | RESPIRATORY_TRACT | Status: DC
Start: 2021-11-14 — End: 2021-11-18
  Administered 2021-11-14 – 2021-11-18 (×9): 0.5 mg via RESPIRATORY_TRACT
  Filled 2021-11-14 (×8): qty 2

## 2021-11-14 MED ORDER — BUDESONIDE 0.5 MG/2ML IN SUSP: 0.5000 mg | Freq: Two times a day (BID) | RESPIRATORY_TRACT | Status: DC

## 2021-11-14 MED ORDER — GUAIFENESIN ER 600 MG PO TB12
1200.0000 mg | ORAL_TABLET | Freq: Two times a day (BID) | ORAL | Status: DC
  Administered 2021-11-14 – 2021-11-19 (×11): 1200 mg via ORAL
  Filled 2021-11-14 (×11): qty 2

## 2021-11-14 MED ORDER — SODIUM CHLORIDE 0.9 % IV SOLN: INTRAVENOUS | Status: DC

## 2021-11-14 MED ORDER — OSELTAMIVIR PHOSPHATE 30 MG PO CAPS
30.0000 mg | ORAL_CAPSULE | Freq: Two times a day (BID) | ORAL | Status: AC
  Administered 2021-11-14 – 2021-11-18 (×10): 30 mg via ORAL
  Filled 2021-11-14 (×10): qty 1

## 2021-11-14 NOTE — Progress Notes (Addendum)
   11/14/21 1500  Clinical Encounter Type  Visited With Patient  Visit Type Initial  Referral From Physician  Consult/Referral To Chaplain  Spiritual Encounters  Spiritual Needs Prayer;Emotional  Stress Factors  Patient Stress Factors Health changes;Family relationships;Lack of caregivers;Lack of knowledge;Loss of control;Major life changes  Family Stress Factors None identified    Chaplain met with [name] at bedside today.  She was sitting up and alert and ready to chat.  She welcomed a visit from [place].  Chaplain provided support in the following ways:  emotional support in that she expressed her dismay that she has COPD and "its getting real bad, nothing works.  My best friend just has a episode where she couldn't breath in middle of night and then she had a heart attack and died." She expressed how this recent event made her exremely scared.  She panicked and could not breathe and so she called for ambulance.  Her daughter lives in [place] and she feels sad about that and its lack of family support.  She discussed "what God is telling me now with this COPD and I'm afraid I have a hard talking to from God about this and I gotta quit smoking."  Chaplain engaged in comfort and support and spiritual encouragement.  She described how she was successful in the past with quitting smoking and now realizes that what it used to do to help her calm down she no longer gets ease but instead get anxious and "speeded up when I smoke"  Chaplain helped her with discussion of what she would need to do to take care of herself.  Loreta spoke to herself about what she needs to do and to get a life alert button to use in the middle of the night.  Chaplain continued to offer support for her internal dialogue and making next steps for herself.   Ended visit with a departing blessing.   Respectfully submitted.   Rev. Wenda Low, M [employer] B[facility], BCPC

## 2021-11-14 NOTE — Progress Notes (Signed)
PROGRESS NOTE    [name]  OIZ:[id number] DOB: 12-26-1955 DOA: 11/13/2021 PCP: Maurice Small, MD (Inactive) (Confirm with patient/family/NH records and if not entered, this HAS to be entered at [facility] point of entry. "No PCP" if truly none.)   Chief Complaint  Patient presents with   Shortness of Breath    Brief Narrative:  Patient 65 year old female history of COPD, hypertension, chronic pain, SVT presented with increased cough over the past 3 to 4 days, cough noted to be nonproductive, patient tried Robitussin-DM, Vicks, Tylenol with no significant improvement patient developed a fever of 100.9 the night prior to admission with no resolution of her symptoms presented to the urgent care.  Patient noted to be hypoxic on room air, chest x-ray done negative, influenza A was positive.  Patient placed on steroids and nebs.  Patient admitted by the hospitalist group.   Assessment & Plan:   Principal Problem:   Influenza A Active Problems:   Tobacco user   Hypertensive disorder   Depressive disorder   Supraventricular tachycardia ([place])   Hypokalemia   COPD with acute exacerbation (HCC)   Dehydration   Acute respiratory failure with hypoxia (HCC)  #1 influenza A -Patient presenting with worsening cough from her baseline, fever of 100.9, some shortness of breath with no resolution of symptoms. -COVID-19 PCR negative, influenza A PCR positive. -Chest x-ray done negative for any acute infiltrates. -Continue Tamiflu, supportive care.  2.  Acute hypoxic respiratory failure secondary to influenza A and acute COPD exacerbation -Patient presented with hypoxia, noted to have increased new O2 requirements as not on O2 prior to admission -COVID-19 PCR negative. -Influenza a PCR positive. -Patient noted to have some wheezing on examination. -Improving clinically. -Continue Tamiflu, steroids, nebulizer treatment. -Add Pulmicort, Claritin, Flonase, PPI, Mucinex. -Supportive care. -Wean  O2.  3.  Hypertension -Continue Cardizem, hydralazine.  4.  History of SVT -Continue Cardizem.  5.  Hypokalemia -Replete.  6.  Transaminitis -Acute hepatitis panel negative. -Right upper quadrant ultrasound done with fatty infiltration, 2 cm ill-defined hypoechoic lesion posterior right hepatic lobe indeterminate, hypervascular lesion identified at this location on prior CT over a year ago no substantial interval change in size suggesting benign etiology, potentially vascular malformation. -Outpatient follow-up with PCP.  7.  Tobacco abuse -Tobacco cessation stressed to patient.  8.  Dehydration -IV fluids.  9.  Depression -Continue home regimen medications.   DVT prophylaxis: Lovenox Code Status: Full Family Communication: Updated patient.  No family at bedside. Disposition:   Status is: Observation  The patient remains OBS appropriate and will d/c before 2 midnights.      Consultants:  None  Procedures:  Chest x-ray 11/13/2021 Right upper quadrant ultrasound 11/14/2021  Antimicrobials:  Tamiflu 11/13/2021>>>> 11/19/2021   Subjective: Sitting up in bed on the telephone.  States she started to feel a little bit better than on admission.  No chest pain.  Shortness of breath improving.  Still with a cough.  Objective: Vitals:   11/14/21 0726 11/14/21 1000 11/14/21 1100 11/14/21 1428  BP:  (!) 152/72    Pulse:  84    Resp:  20    Temp:  98 F (36.7 C)    TempSrc:  Oral    SpO2: 94% 93% 94% 95%  Weight:      Height:       No intake or output data in the 24 hours ending 11/14/21 1734 Filed Weights   11/13/21 1609  Weight: 68 kg    Examination:  General exam: Appears calm and comfortable.  Dry mucous membranes. Respiratory system: Some scattered coarse breath sounds otherwise clear.  No crackles noted.  Fair air movement.  Speaking in full sentences.  Minimal expiratory wheezing Cardiovascular system: S1 & S2 heard, RRR. No JVD, murmurs, rubs,  gallops or clicks. No pedal edema. Gastrointestinal system: Abdomen is nondistended, soft and nontender. No organomegaly or masses felt. Normal bowel sounds heard. Central nervous system: Alert and oriented. No focal neurological deficits. Extremities: Symmetric 5 x 5 power. Skin: No rashes, lesions or ulcers Psychiatry: Judgement and insight appear normal. Mood & affect appropriate.     Data Reviewed: I have personally reviewed following labs and imaging studies  CBC: Recent Labs  Lab 11/14/21 0000  WBC 8.6  HGB 14.6  HCT 44.7  MCV 88.0  PLT 852    Basic Metabolic Panel: Recent Labs  Lab 11/13/21 1157 11/14/21 0000  NA 137 138  K 3.2* 3.4*  CL 99 100  CO2 29 26  GLUCOSE 133* 188*  BUN 13 22  CREATININE 0.88 1.11*  1.01*  CALCIUM 8.8* 9.2  MG  --  2.0    GFR: Estimated Creatinine Clearance: 44.8 mL/min (A) (by C-G formula based on SCr of 1.01 mg/dL (H)).  Liver Function Tests: Recent Labs  Lab 11/13/21 1157 11/14/21 0000  AST 97* 73*  ALT 82* 70*  ALKPHOS 120 98  BILITOT 0.9 0.6  PROT 7.8 7.3  ALBUMIN 4.6 4.1    CBG: No results for input(s): GLUCAP in the last 168 hours.   Recent Results (from the past 240 hour(s))  Resp Panel by RT-PCR (Flu A&B, Covid) Nasopharyngeal Swab     Status: Abnormal   Collection Time: 11/13/21 11:57 AM   Specimen: Nasopharyngeal Swab; Nasopharyngeal(NP) swabs in vial transport medium  Result Value Ref Range Status   SARS Coronavirus 2 by RT PCR NEGATIVE NEGATIVE Final    Comment: (NOTE) SARS-CoV-2 target nucleic acids are NOT DETECTED.  The SARS-CoV-2 RNA is generally detectable in upper respiratory specimens during the acute phase of infection. The lowest concentration of SARS-CoV-2 viral copies this assay can detect is 138 copies/mL. A negative result does not preclude SARS-Cov-2 infection and should not be used as the sole basis for treatment or other patient management decisions. A negative result may occur with   improper specimen collection/handling, submission of specimen other than nasopharyngeal swab, presence of viral mutation(s) within the areas targeted by this assay, and inadequate number of viral copies(<138 copies/mL). A negative result must be combined with clinical observations, patient history, and epidemiological information. The expected result is Negative.  Fact Sheet for Patients:  EntrepreneurPulse.com.au  Fact Sheet for Healthcare Providers:  IncredibleEmployment.be  This test is no t yet approved or cleared by the [place] FDA and  has been authorized for detection and/or diagnosis of SARS-CoV-2 by FDA under an Emergency Use Authorization (EUA). This EUA will remain  in effect (meaning this test can be used) for the duration of the COVID-19 declaration under Section 564(b)(1) of the Act, 21 U.S.C.section 360bbb-3(b)(1), unless the authorization is terminated  or revoked sooner.       Influenza A by PCR POSITIVE (A) NEGATIVE Final   Influenza B by PCR NEGATIVE NEGATIVE Final    Comment: (NOTE) The Xpert Xpress SARS-CoV-2/FLU/RSV plus assay is intended as an aid in the diagnosis of influenza from Nasopharyngeal swab specimens and should not be used as a sole basis for treatment. Nasal washings and aspirates are unacceptable for  Xpert Xpress SARS-CoV-2/FLU/RSV testing.  Fact Sheet for Patients: EntrepreneurPulse.com.au  Fact Sheet for Healthcare Providers: IncredibleEmployment.be  This test is not yet approved or cleared by the [place] FDA and has been authorized for detection and/or diagnosis of SARS-CoV-2 by FDA under an Emergency Use Authorization (EUA). This EUA will remain in effect (meaning this test can be used) for the duration of the COVID-19 declaration under Section 564(b)(1) of the Act, 21 U.S.C. section 360bbb-3(b)(1), unless the authorization is terminated  or revoked.  Performed at [facility], [place] [address], [place], [place] [address]          Radiology Studies: DG Chest Portable 1 View  Result Date: 11/13/2021 CLINICAL DATA:  Chest congestion, cough, and shortness of breath for several weeks, worsened symptoms over last few days with new fever. History COPD, hypertension EXAM: PORTABLE CHEST 1 VIEW COMPARISON:  Portable exam 1200 hours compared to 01/19/2021 FINDINGS: Normal heart size, mediastinal contours, and pulmonary vascularity. Atherosclerotic calcification aorta. Lungs clear. No infiltrate, pleural effusion, or pneumothorax. Osseous demineralization with prior cervicothoracic fusion. IMPRESSION: No acute abnormalities. Aortic Atherosclerosis (ICD10-I70.0). Electronically Signed   By: Lavonia Dana M.D.   On: 11/13/2021 12:39   US Abdomen Limited RUQ (LIVER/GB)  Result Date: 11/14/2021 CLINICAL DATA:  Elevated LFTs. EXAM: ULTRASOUND ABDOMEN LIMITED RIGHT UPPER QUADRANT COMPARISON:  CT scan 02/16/2020 FINDINGS: Gallbladder: Surgically absent. Common bile duct: Diameter: Upper normal to mildly increased for patient age at 7 mm. Liver: Diffusely increased hepatic echogenicity. 2 cm ill-defined hypoechoic lesion noted posterior right liver, similar location to vascular anomaly seen on previous CT image ring 1.9 cm at that time. Portal vein is patent on color Doppler imaging with normal direction of blood flow towards the liver. Other: None. IMPRESSION: 1. Diffusely increased hepatic echogenicity suggests fatty infiltration. 2. 2 cm ill-defined hypoechoic lesion posterior right hepatic lobe, indeterminate. Hypervascular lesion was identified at this location on prior CT exam over 1 year ago with no substantial interval change in size suggesting benign etiology, potentially vascular malformation. Electronically Signed   By: Misty Stanley M.D.   On: 11/14/2021 07:01        Scheduled Meds:  aspirin  325 mg Oral Daily    budesonide (PULMICORT) nebulizer solution  0.5 mg Nebulization BID   cholecalciferol  1,000 Units Oral q morning   clonazePAM  1 mg Oral QHS   diltiazem  120 mg Oral q morning   DULoxetine  60 mg Oral QHS   enoxaparin (LOVENOX) injection  40 mg Subcutaneous Q24H   fluticasone  2 spray Each Nare Daily   guaiFENesin  1,200 mg Oral BID   hydrALAZINE  25 mg Oral BID   ipratropium-albuterol  3 mL Nebulization Q6H   loratadine  10 mg Oral Daily   oseltamivir  30 mg Oral BID   pantoprazole  40 mg Oral q morning   potassium chloride  40 mEq Oral BID   predniSONE  40 mg Oral Q breakfast   Continuous Infusions:  sodium chloride       LOS: 0 days    Time spent: 35 minutes    Irine Seal, MD [facility]   To contact the attending provider between 7A-7P or the covering provider during after hours 7P-7A, please log into the web site [email or web address] and access using universal [place] password for that web site. If you do not have the password, please call the hospital operator.  11/14/2021, 5:34 PM

## 2021-11-15 ENCOUNTER — Inpatient Hospital Stay (HOSPITAL_COMMUNITY): Payer: Medicare Other

## 2021-11-15 DIAGNOSIS — E860 Dehydration: Secondary | ICD-10-CM | POA: Diagnosis not present

## 2021-11-15 DIAGNOSIS — J101 Influenza due to other identified influenza virus with other respiratory manifestations: Secondary | ICD-10-CM | POA: Diagnosis not present

## 2021-11-15 DIAGNOSIS — J9601 Acute respiratory failure with hypoxia: Secondary | ICD-10-CM | POA: Diagnosis not present

## 2021-11-15 DIAGNOSIS — J441 Chronic obstructive pulmonary disease with (acute) exacerbation: Secondary | ICD-10-CM | POA: Diagnosis not present

## 2021-11-15 LAB — CBC WITH DIFFERENTIAL/PLATELET
Abs Immature Granulocytes: 0.1 10*3/uL — ABNORMAL HIGH (ref 0.00–0.07)
Basophils Absolute: 0 10*3/uL (ref 0.0–0.1)
Basophils Relative: 0 %
Eosinophils Absolute: 0 10*3/uL (ref 0.0–0.5)
Eosinophils Relative: 0 %
HCT: 41.6 % (ref 36.0–46.0)
Hemoglobin: 13.1 g/dL (ref 12.0–15.0)
Immature Granulocytes: 1 %
Lymphocytes Relative: 11 %
Lymphs Abs: 1.9 10*3/uL (ref 0.7–4.0)
MCH: 28.1 pg (ref 26.0–34.0)
MCHC: 31.5 g/dL (ref 30.0–36.0)
MCV: 89.3 fL (ref 80.0–100.0)
Monocytes Absolute: 1.3 10*3/uL — ABNORMAL HIGH (ref 0.1–1.0)
Monocytes Relative: 7 %
Neutro Abs: 14.4 10*3/uL — ABNORMAL HIGH (ref 1.7–7.7)
Neutrophils Relative %: 81 %
Platelets: 221 10*3/uL (ref 150–400)
RBC: 4.66 MIL/uL (ref 3.87–5.11)
RDW: 13.6 % (ref 11.5–15.5)
WBC: 17.7 10*3/uL — ABNORMAL HIGH (ref 4.0–10.5)
nRBC: 0 % (ref 0.0–0.2)

## 2021-11-15 LAB — COMPREHENSIVE METABOLIC PANEL
ALT: 50 U/L — ABNORMAL HIGH (ref 0–44)
AST: 49 U/L — ABNORMAL HIGH (ref 15–41)
Albumin: 3.6 g/dL (ref 3.5–5.0)
Alkaline Phosphatase: 79 U/L (ref 38–126)
Anion gap: 5 (ref 5–15)
BUN: 31 mg/dL — ABNORMAL HIGH (ref 8–23)
CO2: 27 mmol/L (ref 22–32)
Calcium: 9.7 mg/dL (ref 8.9–10.3)
Chloride: 103 mmol/L (ref 98–111)
Creatinine, Ser: 0.97 mg/dL (ref 0.44–1.00)
GFR, Estimated: >60 mL/min (ref >60)
Glucose, Bld: 123 mg/dL — ABNORMAL HIGH (ref 70–99)
Potassium: 4.8 mmol/L (ref 3.5–5.1)
Sodium: 135 mmol/L (ref 135–145)
Total Bilirubin: 0.6 mg/dL (ref 0.3–1.2)
Total Protein: 6.3 g/dL — ABNORMAL LOW (ref 6.5–8.1)

## 2021-11-15 LAB — MAGNESIUM: Magnesium: 2 mg/dL (ref 1.7–2.4)

## 2021-11-15 MED ORDER — HYDROCODONE BIT-HOMATROP MBR 5-1.5 MG/5ML PO SOLN
5.0000 mL | Freq: Four times a day (QID) | ORAL | Status: DC | PRN
  Administered 2021-11-15 – 2021-11-17 (×3): 5 mL via ORAL
  Filled 2021-11-15 (×3): qty 5

## 2021-11-15 MED ORDER — KETOROLAC TROMETHAMINE 30 MG/ML IJ SOLN: 30.0000 mg | Freq: Four times a day (QID) | INTRAMUSCULAR | Status: DC | PRN

## 2021-11-15 MED ORDER — KETOROLAC TROMETHAMINE 15 MG/ML IJ SOLN
INTRAMUSCULAR | Status: AC
  Filled 2021-11-15: qty 1

## 2021-11-15 MED ORDER — KETOROLAC TROMETHAMINE 15 MG/ML IJ SOLN
15.0000 mg | Freq: Four times a day (QID) | INTRAMUSCULAR | Status: DC | PRN
  Administered 2021-11-15: 15 mg via INTRAVENOUS

## 2021-11-15 MED ORDER — HYDROCODONE BIT-HOMATROP MBR 5-1.5 MG/5ML PO SOLN
ORAL | Status: AC
  Administered 2021-11-15: 5 mL via ORAL
  Filled 2021-11-15: qty 5

## 2021-11-15 MED ORDER — IPRATROPIUM-ALBUTEROL 0.5-2.5 (3) MG/3ML IN SOLN
3.0000 mL | RESPIRATORY_TRACT | Status: DC | PRN
  Administered 2021-11-16 – 2021-11-18 (×2): 3 mL via RESPIRATORY_TRACT
  Filled 2021-11-15: qty 3

## 2021-11-15 NOTE — Progress Notes (Addendum)
PROGRESS NOTE    [name]  JKK:[id number] DOB: 06-Jul-1956 DOA: 11/13/2021 PCP: Maurice Small, MD (Inactive)    Chief Complaint  Patient presents with   Shortness of Breath    Brief Narrative:  Patient 65 year old female history of COPD, hypertension, chronic pain, SVT presented with increased cough over the past 3 to 4 days, cough noted to be nonproductive, patient tried Robitussin-DM, Vicks, Tylenol with no significant improvement patient developed a fever of 100.9 the night prior to admission with no resolution of her symptoms presented to the urgent care.  Patient noted to be hypoxic on room air, chest x-ray done negative, influenza A was positive.  Patient placed on steroids and nebs.  Patient admitted by the hospitalist group.   Assessment & Plan:   Principal Problem:   Influenza A Active Problems:   Tobacco user   Hypertensive disorder   Depressive disorder   Supraventricular tachycardia ([place])   Hypokalemia   COPD with acute exacerbation (HCC)   Dehydration   Acute respiratory failure with hypoxia (HCC)   Elevated LFTs  1 influenza A -Patient presenting with worsening cough from her baseline, fever of 100.9, some shortness of breath with no resolution of symptoms. -COVID-19 PCR negative, influenza A PCR positive. -Chest x-ray done negative for any acute infiltrates. -Continue Tamiflu, Mucinex, nebulizer treatments, supportive care.   2.  Acute hypoxic respiratory failure secondary to influenza A and acute COPD exacerbation -Patient presented with hypoxia, noted to have increased new O2 requirements as not on O2 prior to admission -COVID-19 PCR negative. -Influenza A PCR positive. -Patient noted to have some wheezing on examination. -Patient with complaints of shortness of breath worsening today and does not feel as well exudate. -Continue Tamiflu, steroids, nebulizer treatment, Pulmicort, Claritin, Flonase, PPI, Mucinex. -Supportive care. -Wean oxygen. -Check  ambulatory sats in the a.m.  3.  Hypertension -Hydralazine, Cardizem.   4.  History of SVT -Cardizem  5.  Hypokalemia -Repleted.  Potassium of 4.8.  6.  Transaminitis -Acute hepatitis panel negative. -Right upper quadrant ultrasound done with fatty infiltration, 2 cm ill-defined hypoechoic lesion posterior right hepatic lobe indeterminate, hypervascular lesion identified at this location on prior CT over a year ago no substantial interval change in size suggesting benign etiology, potentially vascular malformation. -Outpatient follow-up with PCP.  7.  Tobacco abuse -Tobacco cessation stressed to patient.  8.  Dehydration -Saline lock IV fluids.  9.  Depression -Continue Cymbalta, Klonopin.   10.  Obesity -Lifestyle modification, outpatient follow-up with PCP.   DVT prophylaxis: Lovenox Code Status: Full Family Communication: Updated patient.  No family at bedside. Disposition:   Status is: Observation  The patient remains OBS appropriate and will d/c before 2 midnights.      Consultants:  None  Procedures:  Chest x-ray 11/13/2021 Right upper quadrant ultrasound 11/14/2021  Antimicrobials:  Tamiflu 11/13/2021>>>> 11/19/2021   Subjective: Per RN patient with expiratory wheezing early on this morning, stated did not feel well, worsening shortness of breath.  Patient with shortness of breath.  No chest pain.  States had some wheezing earlier on.  Feels something is stuck in her throat.   Objective: Vitals:   11/14/21 2005 11/15/21 0140 11/15/21 0422 11/15/21 0726  BP: 109/62  127/71   Pulse: 66  60   Resp: 20  (!) 22   Temp: 97.8 F (36.6 C)  98.2 F (36.8 C)   TempSrc: Oral  Oral   SpO2: 96% 96% 98% 97%  Weight:  Height:        Intake/Output Summary (Last 24 hours) at 11/15/2021 1259 Last data filed at 11/15/2021 0600 Gross per 24 hour  Intake 896.91 ml  Output 375 ml  Net 521.91 ml   Filed Weights   11/13/21 1609  Weight: 68 kg     Examination:  General exam: Dry mucous membranes.   Respiratory system: Decreased breath sounds in the bases.  Decreased coarse breath sounds.  No crackles noted.  Fair air movement.  Minimal expiratory wheezing.  Cardiovascular system: Regular rate rhythm no murmurs rubs or gallops.  No JVD.  No lower extremity edema.  Gastrointestinal system: Abdomen is soft, nontender, nondistended, positive bowel sounds.  No rebound.  No guarding. Central nervous system: Alert and oriented.  Moving extremities spontaneously.  No focal neurological deficits.  Extremities: Symmetric 5 x 5 power. Skin: No rashes, lesions or ulcers Psychiatry: Judgement and insight appear normal. Mood & affect appropriate.     Data Reviewed: I have personally reviewed following labs and imaging studies  CBC: Recent Labs  Lab 11/14/21 0000 11/15/21 0505  WBC 8.6 17.7*  NEUTROABS  --  14.4*  HGB 14.6 13.1  HCT 44.7 41.6  MCV 88.0 89.3  PLT 207 221     Basic Metabolic Panel: Recent Labs  Lab 11/13/21 1157 11/14/21 0000 11/15/21 0505  NA 137 138 135  K 3.2* 3.4* 4.8  CL 99 100 103  CO2 29 26 27   GLUCOSE 133* 188* 123*  BUN 13 22 31*  CREATININE 0.88 1.11*  1.01* 0.97  CALCIUM 8.8* 9.2 9.7  MG  --  2.0 2.0     GFR: Estimated Creatinine Clearance: 46.6 mL/min (by C-G formula based on SCr of 0.97 mg/dL).  Liver Function Tests: Recent Labs  Lab 11/13/21 1157 11/14/21 0000 11/15/21 0505  AST 97* 73* 49*  ALT 82* 70* 50*  ALKPHOS 120 98 79  BILITOT 0.9 0.6 0.6  PROT 7.8 7.3 6.3*  ALBUMIN 4.6 4.1 3.6     CBG: No results for input(s): GLUCAP in the last 168 hours.   Recent Results (from the past 240 hour(s))  Resp Panel by RT-PCR (Flu A&B, Covid) Nasopharyngeal Swab     Status: Abnormal   Collection Time: 11/13/21 11:57 AM   Specimen: Nasopharyngeal Swab; Nasopharyngeal(NP) swabs in vial transport medium  Result Value Ref Range Status   SARS Coronavirus 2 by RT PCR NEGATIVE  NEGATIVE Final    Comment: (NOTE) SARS-CoV-2 target nucleic acids are NOT DETECTED.  The SARS-CoV-2 RNA is generally detectable in upper respiratory specimens during the acute phase of infection. The lowest concentration of SARS-CoV-2 viral copies this assay can detect is 138 copies/mL. A negative result does not preclude SARS-Cov-2 infection and should not be used as the sole basis for treatment or other patient management decisions. A negative result may occur with  improper specimen collection/handling, submission of specimen other than nasopharyngeal swab, presence of viral mutation(s) within the areas targeted by this assay, and inadequate number of viral copies(<138 copies/mL). A negative result must be combined with clinical observations, patient history, and epidemiological information. The expected result is Negative.  Fact Sheet for Patients:  EntrepreneurPulse.com.au  Fact Sheet for Healthcare Providers:  IncredibleEmployment.be  This test is no t yet approved or cleared by the [place] FDA and  has been authorized for detection and/or diagnosis of SARS-CoV-2 by FDA under an Emergency Use Authorization (EUA). This EUA will remain  in effect (meaning this test can be  used) for the duration of the COVID-19 declaration under Section 564(b)(1) of the Act, 21 U.S.C.section 360bbb-3(b)(1), unless the authorization is terminated  or revoked sooner.       Influenza A by PCR POSITIVE (A) NEGATIVE Final   Influenza B by PCR NEGATIVE NEGATIVE Final    Comment: (NOTE) The Xpert Xpress SARS-CoV-2/FLU/RSV plus assay is intended as an aid in the diagnosis of influenza from Nasopharyngeal swab specimens and should not be used as a sole basis for treatment. Nasal washings and aspirates are unacceptable for Xpert Xpress SARS-CoV-2/FLU/RSV testing.  Fact Sheet for Patients: EntrepreneurPulse.com.au  Fact Sheet for  Healthcare Providers: IncredibleEmployment.be  This test is not yet approved or cleared by the [place] FDA and has been authorized for detection and/or diagnosis of SARS-CoV-2 by FDA under an Emergency Use Authorization (EUA). This EUA will remain in effect (meaning this test can be used) for the duration of the COVID-19 declaration under Section 564(b)(1) of the Act, 21 U.S.C. section 360bbb-3(b)(1), unless the authorization is terminated or revoked.  Performed at [facility], [place] [address], [place], [place] [address]           Radiology Studies: US Abdomen Limited RUQ (LIVER/GB)  Result Date: 11/14/2021 CLINICAL DATA:  Elevated LFTs. EXAM: ULTRASOUND ABDOMEN LIMITED RIGHT UPPER QUADRANT COMPARISON:  CT scan 02/16/2020 FINDINGS: Gallbladder: Surgically absent. Common bile duct: Diameter: Upper normal to mildly increased for patient age at 7 mm. Liver: Diffusely increased hepatic echogenicity. 2 cm ill-defined hypoechoic lesion noted posterior right liver, similar location to vascular anomaly seen on previous CT image ring 1.9 cm at that time. Portal vein is patent on color Doppler imaging with normal direction of blood flow towards the liver. Other: None. IMPRESSION: 1. Diffusely increased hepatic echogenicity suggests fatty infiltration. 2. 2 cm ill-defined hypoechoic lesion posterior right hepatic lobe, indeterminate. Hypervascular lesion was identified at this location on prior CT exam over 1 year ago with no substantial interval change in size suggesting benign etiology, potentially vascular malformation. Electronically Signed   By: Misty Stanley M.D.   On: 11/14/2021 07:01        Scheduled Meds:  aspirin  325 mg Oral Daily   budesonide (PULMICORT) nebulizer solution  0.5 mg Nebulization BID   cholecalciferol  1,000 Units Oral q morning   clonazePAM  1 mg Oral QHS   diltiazem  120 mg Oral q morning   DULoxetine  60 mg Oral QHS    enoxaparin (LOVENOX) injection  40 mg Subcutaneous Q24H   fluticasone  2 spray Each Nare Daily   guaiFENesin  1,200 mg Oral BID   hydrALAZINE  25 mg Oral BID   ipratropium-albuterol  3 mL Nebulization Q6H   loratadine  10 mg Oral Daily   oseltamivir  30 mg Oral BID   pantoprazole  40 mg Oral q morning   predniSONE  40 mg Oral Q breakfast   Continuous Infusions:     LOS: 1 day    Time spent: 35 minutes    Irine Seal, MD [facility]   To contact the attending provider between 7A-7P or the covering provider during after hours 7P-7A, please log into the web site [email or web address] and access using universal [place] password for that web site. If you do not have the password, please call the hospital operator.  11/15/2021, 12:59 PM

## 2021-11-15 NOTE — Progress Notes (Signed)
Patient ambulated on RA maintained O2 sats above 93%.

## 2021-11-16 DIAGNOSIS — J101 Influenza due to other identified influenza virus with other respiratory manifestations: Secondary | ICD-10-CM | POA: Diagnosis not present

## 2021-11-16 DIAGNOSIS — J441 Chronic obstructive pulmonary disease with (acute) exacerbation: Secondary | ICD-10-CM | POA: Diagnosis not present

## 2021-11-16 DIAGNOSIS — J9601 Acute respiratory failure with hypoxia: Secondary | ICD-10-CM | POA: Diagnosis not present

## 2021-11-16 LAB — CBC WITH DIFFERENTIAL/PLATELET
Abs Immature Granulocytes: 0.17 10*3/uL — ABNORMAL HIGH (ref 0.00–0.07)
Basophils Absolute: 0 10*3/uL (ref 0.0–0.1)
Basophils Relative: 0 %
Eosinophils Absolute: 0 10*3/uL (ref 0.0–0.5)
Eosinophils Relative: 0 %
HCT: 43.4 % (ref 36.0–46.0)
Hemoglobin: 13.5 g/dL (ref 12.0–15.0)
Immature Granulocytes: 1 %
Lymphocytes Relative: 19 %
Lymphs Abs: 2.9 10*3/uL (ref 0.7–4.0)
MCH: 28.4 pg (ref 26.0–34.0)
MCHC: 31.1 g/dL (ref 30.0–36.0)
MCV: 91.4 fL (ref 80.0–100.0)
Monocytes Absolute: 1.2 10*3/uL — ABNORMAL HIGH (ref 0.1–1.0)
Monocytes Relative: 8 %
Neutro Abs: 10.6 10*3/uL — ABNORMAL HIGH (ref 1.7–7.7)
Neutrophils Relative %: 72 %
Platelets: 196 10*3/uL (ref 150–400)
RBC: 4.75 MIL/uL (ref 3.87–5.11)
RDW: 13.6 % (ref 11.5–15.5)
WBC: 14.9 10*3/uL — ABNORMAL HIGH (ref 4.0–10.5)
nRBC: 0 % (ref 0.0–0.2)

## 2021-11-16 LAB — BASIC METABOLIC PANEL
Anion gap: 11 (ref 5–15)
BUN: 32 mg/dL — ABNORMAL HIGH (ref 8–23)
CO2: 23 mmol/L (ref 22–32)
Calcium: 9.6 mg/dL (ref 8.9–10.3)
Chloride: 106 mmol/L (ref 98–111)
Creatinine, Ser: 0.86 mg/dL (ref 0.44–1.00)
GFR, Estimated: >60 mL/min (ref >60)
Glucose, Bld: 99 mg/dL (ref 70–99)
Potassium: 4.6 mmol/L (ref 3.5–5.1)
Sodium: 140 mmol/L (ref 135–145)

## 2021-11-16 LAB — MAGNESIUM: Magnesium: 2 mg/dL (ref 1.7–2.4)

## 2021-11-16 NOTE — Evaluation (Signed)
Physical Therapy Evaluation Patient Details Name: [name] MRN: [id number] DOB: 20-Jun-1956 Today's Date: 11/16/2021  History of Present Illness  Patient 65 year old female history of COPD, hypertension, chronic pain, SVT presented with increased cough , fever, Positive for influenza A.PMH: TKA, back fusion, fibromyalgia,CAD, COPD, depression/anxiety  Clinical Impression  Patient reports feeling better. Patient reports up adlib to BR, noted to hold bed, wall.. Patient did ambulate x 140' in hall, intermittent use of rail or HHA.   Spo2 on RA .925 throughout activity, standing to sponge bathe then ambulate  in hall. Patient should progress to return home.  Pt admitted with above diagnosis.  Pt currently with functional limitations due to the deficits listed below (see PT Problem List). Pt will benefit from skilled PT to increase their independence and safety with mobility to allow discharge to the venue listed below.          Recommendations for follow up therapy are one component of a multi-disciplinary discharge planning process, led by the attending physician.  Recommendations may be updated based on patient status, additional functional criteria and insurance authorization.  Follow Up Recommendations No PT follow up    Assistance Recommended at Discharge None  Functional Status Assessment Patient has had a recent decline in their functional status and demonstrates the ability to make significant improvements in function in a reasonable and predictable amount of time.  Equipment Recommendations  None recommended by PT    Recommendations for Other Services       Precautions / Restrictions Precautions Precautions: Fall Precaution Comments: monitor sats      Mobility  Bed Mobility Overal bed mobility: Independent                  Transfers Overall transfer level: Independent                      Ambulation/Gait Ambulation/Gait assistance: Min guard Gait  Distance (Feet): 140 Feet Assistive device: 1 person hand held assist;None Gait Pattern/deviations: Step-through pattern Gait velocity: decr     General Gait Details: up ad lib in room to BR, holds to bed, wall. In hall HHA and/or rail in hall.  Stairs            Wheelchair Mobility    Modified Rankin (Stroke Patients Only)       Balance Overall balance assessment: Mild deficits observed, not formally tested                                           Pertinent Vitals/Pain Pain Assessment: Faces Faces Pain Scale: Hurts little more Pain Location: throat Pain Descriptors / Indicators: Discomfort Pain Intervention(s): Monitored during session    Home Living Family/patient expects to be discharged to:: Private residence Living Arrangements: Alone Available Help at Discharge: Friend(s);Family;Available PRN/intermittently Type of Home: Mobile home Home Access: Stairs to enter Entrance Stairs-Rails: Right;Left Entrance Stairs-Number of Steps: 5 steps +2 at [place]: One level Home Equipment: Conservation officer, nature (2 wheels);Air cabin crew (4 wheels);BSC/3in1      Prior Function Prior Level of Function : Independent/Modified Independent                     Hand Dominance   Dominant Hand: Left    Extremity/Trunk Assessment   Upper Extremity Assessment Upper Extremity Assessment: Overall WFL for tasks assessed  Lower Extremity Assessment Lower Extremity Assessment: Overall WFL for tasks assessed    Cervical / Trunk Assessment Cervical / Trunk Assessment: Normal  Communication   Communication: No difficulties  Cognition Arousal/Alertness: Awake/alert Behavior During Therapy: WFL for tasks assessed/performed Overall Cognitive Status: Within Functional Limits for tasks assessed                                          General Comments      Exercises     Assessment/Plan    PT Assessment Patient  needs continued PT services  PT Problem List Decreased strength;Decreased mobility;Decreased activity tolerance;Decreased knowledge of use of DME       PT Treatment Interventions Therapeutic activities;Gait training;Therapeutic exercise;Patient/family education;Functional mobility training    PT Goals (Current goals can be found in the Care Plan section)  Acute Rehab PT Goals Patient Stated Goal: go home PT Goal Formulation: With patient Time For Goal Achievement: 11/30/21 Potential to Achieve Goals: Good    Frequency Min 3X/week   Barriers to discharge        Co-evaluation               AM-PAC PT "6 Clicks" Mobility  Outcome Measure Help needed turning from your back to your side while in a flat bed without using bedrails?: None Help needed moving from lying on your back to sitting on the side of a flat bed without using bedrails?: None Help needed moving to and from a bed to a chair (including a wheelchair)?: None Help needed standing up from a chair using your arms (e.g., wheelchair or bedside chair)?: None Help needed to walk in hospital room?: A Little Help needed climbing 3-5 steps with a railing? : A Little 6 Click Score: 22    End of Session   Activity Tolerance: Patient tolerated treatment well Patient left: in bed;with call bell/phone within reach Nurse Communication: Mobility status PT Visit Diagnosis: Unsteadiness on feet (R26.81);Difficulty in walking, not elsewhere classified (R26.2)    Time: 1445-1520 PT Time Calculation (min) (ACUTE ONLY): 35 min   Charges:   PT Evaluation $PT Eval Low Complexity: 1 Low PT Treatments $Gait Training: 8-22 mins        Tresa Endo PT Acute Rehabilitation Services Pager [phone] Office [phone]   Claretha Cooper 11/16/2021, 3:31 PM

## 2021-11-16 NOTE — Progress Notes (Signed)
Patient noted to have complained of 7/10 sharp substernal chest pain and shortness of breath. Currently on 2L satting at 95%. CP is not worsened by inspiration or cough. BP noted to be 184/92.  EKG obtained and in chart. MD paged. Charge nurse at bedside. PRN IV metoprolol administered. Will recheck BP in 30 minutes.

## 2021-11-16 NOTE — Progress Notes (Signed)
PROGRESS NOTE    BENELLI WINTHER  XBD:[id number] DOB: Jan 11, 1956 DOA: 11/13/2021 PCP: Maurice Small, MD (Inactive)    Chief Complaint  Patient presents with   Shortness of Breath    Brief Narrative:  Patient 65 year old female history of COPD, hypertension, chronic pain, SVT presented with increased cough , and fevers, sob and hypoxic on RA.  She was found to have influenza A.  She was admitted and started on tamiflu and steroids.    Assessment & Plan:   Principal Problem:   Influenza A Active Problems:   Tobacco user   Hypertensive disorder   Depressive disorder   Supraventricular tachycardia ([place])   Hypokalemia   COPD with acute exacerbation (HCC)   Dehydration   Acute respiratory failure with hypoxia (HCC)   Elevated LFTs   Acute hypoxic respiratory failure sec to influenza A and copd exacerbation:  - complete the course of Tamiflu and steroids.  Continue with duo nebs and cough medication.  - wean oxygen as tolerated.  - CXR is neg for infection.    Hypertension:  Sub optimal.  Resume home meds.    SVT:  - continue with cardizem.    Hypokalemia:  Replaced.    Tobacco abuse:  Counseled to stop smoking     Transaminitis.  - acute hep panel negative.  -Right upper quadrant ultrasound done with fatty infiltration, 2 cm ill-defined hypoechoic lesion posterior right hepatic lobe indeterminate, hypervascular lesion identified at this location on prior CT over a year ago no substantial interval change in size suggesting benign etiology, potentially vascular malformation. Recommend outpatient follow up .    Body mass index is 33.56 kg/m. Obesity;  Increased risk of morbidity and mortality.    Depression:  Resume home meds.    DVT prophylaxis: (Lovenox/ Code Status: (Full/ Code) Family Communication: none at bedside.  Disposition:   Status is: Inpatient  Remains inpatient appropriate because: hypoxia, improvement in respiratory status.       Consultants:  None.  Procedures: none.   Antimicrobials:  Anti-infectives (From admission, onward)    Start     Dose/Rate Route Frequency Ordered Stop   11/14/21 1000  oseltamivir (TAMIFLU) capsule 75 mg  Status:  Discontinued        75 mg Oral 2 times daily 11/13/21 2340 11/14/21 0038   11/14/21 1000  oseltamivir (TAMIFLU) capsule 30 mg        30 mg Oral 2 times daily 11/14/21 0039 11/19/21 0959   11/13/21 1600  oseltamivir (TAMIFLU) capsule 75 mg        75 mg Oral  Once 11/13/21 1552 11/13/21 1624         Subjective: Sob, chest tightness.   Objective: Vitals:   11/16/21 0434 11/16/21 0745 11/16/21 0843 11/16/21 0911  BP:   (!) 184/92   Pulse:   64   Resp:      Temp:   97.8 F (36.6 C)   TempSrc:   Oral   SpO2:  93% 99% 95%  Weight: 74.1 kg     Height:       No intake or output data in the 24 hours ending 11/16/21 1049 Filed Weights   11/13/21 1609 11/15/21 2044 11/16/21 0434  Weight: 68 kg 74.1 kg 74.1 kg    Examination:  General exam: Appears calm and comfortable  Respiratory system: wheezing posteriorly.  Cardiovascular system: S1 & S2 heard, RRR. No JVD,  No pedal edema. Gastrointestinal system: Abdomen is nondistended, soft and nontender. Normal  bowel sounds heard. Central nervous system: Alert and oriented. No focal neurological deficits. Extremities: Symmetric 5 x 5 power. Skin: No rashes, lesions or ulcers Psychiatry:  Mood & affect appropriate.     Data Reviewed: I have personally reviewed following labs and imaging studies  CBC: Recent Labs  Lab 11/14/21 0000 11/15/21 0505 11/16/21 0520  WBC 8.6 17.7* 14.9*  NEUTROABS  --  14.4* 10.6*  HGB 14.6 13.1 13.5  HCT 44.7 41.6 43.4  MCV 88.0 89.3 91.4  PLT 207 221 497    Basic Metabolic Panel: Recent Labs  Lab 11/13/21 1157 11/14/21 0000 11/15/21 0505 11/16/21 0520  NA 137 138 135 140  K 3.2* 3.4* 4.8 4.6  CL 99 100 103 106  CO2 29 26 27 23   GLUCOSE 133* 188* 123* 99  BUN 13 22  31* 32*  CREATININE 0.88 1.11*  1.01* 0.97 0.86  CALCIUM 8.8* 9.2 9.7 9.6  MG  --  2.0 2.0 2.0    GFR: Estimated Creatinine Clearance: 55 mL/min (by C-G formula based on SCr of 0.86 mg/dL).  Liver Function Tests: Recent Labs  Lab 11/13/21 1157 11/14/21 0000 11/15/21 0505  AST 97* 73* 49*  ALT 82* 70* 50*  ALKPHOS 120 98 79  BILITOT 0.9 0.6 0.6  PROT 7.8 7.3 6.3*  ALBUMIN 4.6 4.1 3.6    CBG: No results for input(s): GLUCAP in the last 168 hours.   Recent Results (from the past 240 hour(s))  Resp Panel by RT-PCR (Flu A&B, Covid) Nasopharyngeal Swab     Status: Abnormal   Collection Time: 11/13/21 11:57 AM   Specimen: Nasopharyngeal Swab; Nasopharyngeal(NP) swabs in vial transport medium  Result Value Ref Range Status   SARS Coronavirus 2 by RT PCR NEGATIVE NEGATIVE Final    Comment: (NOTE) SARS-CoV-2 target nucleic acids are NOT DETECTED.  The SARS-CoV-2 RNA is generally detectable in upper respiratory specimens during the acute phase of infection. The lowest concentration of SARS-CoV-2 viral copies this assay can detect is 138 copies/mL. A negative result does not preclude SARS-Cov-2 infection and should not be used as the sole basis for treatment or other patient management decisions. A negative result may occur with  improper specimen collection/handling, submission of specimen other than nasopharyngeal swab, presence of viral mutation(s) within the areas targeted by this assay, and inadequate number of viral copies(<138 copies/mL). A negative result must be combined with clinical observations, patient history, and epidemiological information. The expected result is Negative.  Fact Sheet for Patients:  EntrepreneurPulse.com.au  Fact Sheet for Healthcare Providers:  IncredibleEmployment.be  This test is no t yet approved or cleared by the [place] FDA and  has been authorized for detection and/or diagnosis of SARS-CoV-2  by FDA under an Emergency Use Authorization (EUA). This EUA will remain  in effect (meaning this test can be used) for the duration of the COVID-19 declaration under Section 564(b)(1) of the Act, 21 U.S.C.section 360bbb-3(b)(1), unless the authorization is terminated  or revoked sooner.       Influenza A by PCR POSITIVE (A) NEGATIVE Final   Influenza B by PCR NEGATIVE NEGATIVE Final    Comment: (NOTE) The Xpert Xpress SARS-CoV-2/FLU/RSV plus assay is intended as an aid in the diagnosis of influenza from Nasopharyngeal swab specimens and should not be used as a sole basis for treatment. Nasal washings and aspirates are unacceptable for Xpert Xpress SARS-CoV-2/FLU/RSV testing.  Fact Sheet for Patients: EntrepreneurPulse.com.au  Fact Sheet for Healthcare Providers: IncredibleEmployment.be  This test is not  yet approved or cleared by the [place] and has been authorized for detection and/or diagnosis of SARS-CoV-2 by FDA under an Emergency Use Authorization (EUA). This EUA will remain in effect (meaning this test can be used) for the duration of the COVID-19 declaration under Section 564(b)(1) of the Act, 21 U.S.C. section 360bbb-3(b)(1), unless the authorization is terminated or revoked.  Performed at [facility], [place] [address], [place], [place] [address]          Radiology Studies: DG Chest 2 View  Result Date: 11/15/2021 CLINICAL DATA:  Cough and shortness of breath. EXAM: CHEST - 2 VIEW COMPARISON:  Chest x-ray 11/13/2021. FINDINGS: The heart size and mediastinal contours are within normal limits. There is no pleural effusion or pneumothorax. There is minimal focal opacity in the left suprahilar region, indeterminate. The lungs are otherwise clear. Cervical spinal fusion plate is again seen. Cervical spinal fusion plate is again noted. No acute fractures. IMPRESSION: 1. Minimal left suprahilar opacity,  indeterminate. Findings may be related to confluence of normal structures, airspace disease, or other etiologies. Followup PA and lateral chest X-ray is recommended in 3-4 weeks following trial of antibiotic therapy to ensure resolution and exclude underlying malignancy. Electronically Signed   By: Ronney Asters M.D.   On: 11/15/2021 15:47        Scheduled Meds:  aspirin  325 mg Oral Daily   budesonide (PULMICORT) nebulizer solution  0.5 mg Nebulization BID   cholecalciferol  1,000 Units Oral q morning   clonazePAM  1 mg Oral QHS   diltiazem  120 mg Oral q morning   DULoxetine  60 mg Oral QHS   enoxaparin (LOVENOX) injection  40 mg Subcutaneous Q24H   fluticasone  2 spray Each Nare Daily   guaiFENesin  1,200 mg Oral BID   hydrALAZINE  25 mg Oral BID   ipratropium-albuterol  3 mL Nebulization Q6H   loratadine  10 mg Oral Daily   oseltamivir  30 mg Oral BID   pantoprazole  40 mg Oral q morning   predniSONE  40 mg Oral Q breakfast   Continuous Infusions:   LOS: 2 days        Hosie Poisson, MD [facility]   To contact the attending provider between 7A-7P or the covering provider during after hours 7P-7A, please log into the web site [email or web address] and access using universal [place] password for that web site. If you do not have the password, please call the hospital operator.  11/16/2021, 10:49 AM

## 2021-11-17 DIAGNOSIS — J101 Influenza due to other identified influenza virus with other respiratory manifestations: Secondary | ICD-10-CM | POA: Diagnosis not present

## 2021-11-17 DIAGNOSIS — J441 Chronic obstructive pulmonary disease with (acute) exacerbation: Secondary | ICD-10-CM | POA: Diagnosis not present

## 2021-11-17 DIAGNOSIS — J9601 Acute respiratory failure with hypoxia: Secondary | ICD-10-CM | POA: Diagnosis not present

## 2021-11-17 MED ORDER — IPRATROPIUM-ALBUTEROL 0.5-2.5 (3) MG/3ML IN SOLN
3.0000 mL | Freq: Three times a day (TID) | RESPIRATORY_TRACT | Status: DC
  Administered 2021-11-18: 3 mL via RESPIRATORY_TRACT
  Filled 2021-11-17: qty 3

## 2021-11-17 NOTE — Plan of Care (Signed)
Palliative consult received and reviewed with [facility] Attending. Ms. Cahoon is improving at this time and there is not an urgent inpatient palliative care need. Will place referral to a community based program at discharge. Please reconsult if her condition deteriorates or there are other palliative care needs.  Lane Hacker, DO Palliative Medicine

## 2021-11-17 NOTE — Progress Notes (Signed)
PROGRESS NOTE    [name]  IOM:[id number] DOB: [birth year] DOA: 11/13/2021 PCP: Maurice Small, MD (Inactive)    Chief Complaint  Patient presents with   Shortness of Breath    Brief Narrative:  Patient 65 year old female history of COPD, hypertension, chronic pain, SVT presented with increased cough , and fevers, sob and hypoxic on RA.  She was found to have influenza A.  She was admitted and started on tamiflu and steroids.    Assessment & Plan:   Principal Problem:   Influenza A Active Problems:   Tobacco user   Hypertensive disorder   Depressive disorder   Supraventricular tachycardia ([place])   Hypokalemia   COPD with acute exacerbation (HCC)   Dehydration   Acute respiratory failure with hypoxia (HCC)   Elevated LFTs   Acute hypoxic respiratory failure sec to influenza A and copd exacerbation:  - complete the course of Tamiflu and steroids.  Continue with duo nebs and cough medication.  - wean oxygen as tolerated. Unable to wean her off oxygen today.  - CXR is neg for infection.    Hypertension:  Better controlled today.    SVT:  - continue with cardizem.    Hypokalemia:  Replaced. Repeat level wnl.    Tobacco abuse:  Counseled to stop smoking     Transaminitis.  - acute hep panel negative.  -Right upper quadrant ultrasound done with fatty infiltration, 2 cm ill-defined hypoechoic lesion posterior right hepatic lobe indeterminate, hypervascular lesion identified at this location on prior CT over a year ago no substantial interval change in size suggesting benign etiology, potentially vascular malformation. Recommend outpatient follow up .    Body mass index is 32.75 kg/m. Obesity;  Increased risk of morbidity and mortality.    Depression:  Resume home meds.    DVT prophylaxis: (Lovenox) Code Status: (Full/ Code) Family Communication: none at bedside.  Disposition:   Status is: Inpatient  Remains inpatient appropriate because:  hypoxia, improvement in respiratory status.      Consultants:  None.  Procedures: none.   Antimicrobials:  Anti-infectives (From admission, onward)    Start     Dose/Rate Route Frequency Ordered Stop   11/14/21 1000  oseltamivir (TAMIFLU) capsule 75 mg  Status:  Discontinued        75 mg Oral 2 times daily 11/13/21 2340 11/14/21 0038   11/14/21 1000  oseltamivir (TAMIFLU) capsule 30 mg        30 mg Oral 2 times daily 11/14/21 0039 11/19/21 0959   11/13/21 1600  oseltamivir (TAMIFLU) capsule 75 mg        75 mg Oral  Once 11/13/21 1552 11/13/21 1624         Subjective: Sob improved. But not back to baseline.   Objective: Vitals:   11/17/21 0403 11/17/21 0500 11/17/21 0840 11/17/21 1541  BP: (!) 158/79   (!) 151/66  Pulse: 62   66  Resp: 20   20  Temp: 98.6 F (37 C)   98.5 F (36.9 C)  TempSrc: Oral   Oral  SpO2: 97%  94% 91%  Weight:  72.3 kg    Height:       No intake or output data in the 24 hours ending 11/17/21 [place]   11/16/21 0434 11/16/21 2009 11/17/21 0500  Weight: 74.1 kg 72.3 kg 72.3 kg    Examination:  General exam: Appears calm and comfortable  Respiratory system: scattered rhonchi posteriorly, air entry fair, on 2 lit of  [place] oxygen.  Cardiovascular system: S1 & S2 heard, RRR. No JVD, . No pedal edema. Gastrointestinal system: Abdomen is nondistended, soft and nontender. Normal bowel sounds heard. Central nervous system: Alert and oriented. No focal neurological deficits. Extremities: Symmetric 5 x 5 power. Skin: No rashes, lesions or ulcers Psychiatry:  Mood & affect appropriate.      Data Reviewed: I have personally reviewed following labs and imaging studies  CBC: Recent Labs  Lab 11/14/21 0000 11/15/21 0505 11/16/21 0520  WBC 8.6 17.7* 14.9*  NEUTROABS  --  14.4* 10.6*  HGB 14.6 13.1 13.5  HCT 44.7 41.6 43.4  MCV 88.0 89.3 91.4  PLT 207 221 196     Basic Metabolic Panel: Recent Labs  Lab 11/13/21 1157  11/14/21 0000 11/15/21 0505 11/16/21 0520  NA 137 138 135 140  K 3.2* 3.4* 4.8 4.6  CL 99 100 103 106  CO2 29 26 27 23   GLUCOSE 133* 188* 123* 99  BUN 13 22 31* 32*  CREATININE 0.88 1.11*  1.01* 0.97 0.86  CALCIUM 8.8* 9.2 9.7 9.6  MG  --  2.0 2.0 2.0     GFR: Estimated Creatinine Clearance: 54.3 mL/min (by C-G formula based on SCr of 0.86 mg/dL).  Liver Function Tests: Recent Labs  Lab 11/13/21 1157 11/14/21 0000 11/15/21 0505  AST 97* 73* 49*  ALT 82* 70* 50*  ALKPHOS 120 98 79  BILITOT 0.9 0.6 0.6  PROT 7.8 7.3 6.3*  ALBUMIN 4.6 4.1 3.6     CBG: No results for input(s): GLUCAP in the last 168 hours.   Recent Results (from the past 240 hour(s))  Resp Panel by RT-PCR (Flu A&B, Covid) Nasopharyngeal Swab     Status: Abnormal   Collection Time: 11/13/21 11:57 AM   Specimen: Nasopharyngeal Swab; Nasopharyngeal(NP) swabs in vial transport medium  Result Value Ref Range Status   SARS Coronavirus 2 by RT PCR NEGATIVE NEGATIVE Final    Comment: (NOTE) SARS-CoV-2 target nucleic acids are NOT DETECTED.  The SARS-CoV-2 RNA is generally detectable in upper respiratory specimens during the acute phase of infection. The lowest concentration of SARS-CoV-2 viral copies this assay can detect is 138 copies/mL. A negative result does not preclude SARS-Cov-2 infection and should not be used as the sole basis for treatment or other patient management decisions. A negative result may occur with  improper specimen collection/handling, submission of specimen other than nasopharyngeal swab, presence of viral mutation(s) within the areas targeted by this assay, and inadequate number of viral copies(<138 copies/mL). A negative result must be combined with clinical observations, patient history, and epidemiological information. The expected result is Negative.  Fact Sheet for Patients:  EntrepreneurPulse.com.au  Fact Sheet for Healthcare Providers:   IncredibleEmployment.be  This test is no t yet approved or cleared by the [place] FDA and  has been authorized for detection and/or diagnosis of SARS-CoV-2 by FDA under an Emergency Use Authorization (EUA). This EUA will remain  in effect (meaning this test can be used) for the duration of the COVID-19 declaration under Section 564(b)(1) of the Act, 21 U.S.C.section 360bbb-3(b)(1), unless the authorization is terminated  or revoked sooner.       Influenza A by PCR POSITIVE (A) NEGATIVE Final   Influenza B by PCR NEGATIVE NEGATIVE Final    Comment: (NOTE) The Xpert Xpress SARS-CoV-2/FLU/RSV plus assay is intended as an aid in the diagnosis of influenza from Nasopharyngeal swab specimens and should not be used as a sole basis for treatment.  Nasal washings and aspirates are unacceptable for Xpert Xpress SARS-CoV-2/FLU/RSV testing.  Fact Sheet for Patients: EntrepreneurPulse.com.au  Fact Sheet for Healthcare Providers: IncredibleEmployment.be  This test is not yet approved or cleared by the [place] FDA and has been authorized for detection and/or diagnosis of SARS-CoV-2 by FDA under an Emergency Use Authorization (EUA). This EUA will remain in effect (meaning this test can be used) for the duration of the COVID-19 declaration under Section 564(b)(1) of the Act, 21 U.S.C. section 360bbb-3(b)(1), unless the authorization is terminated or revoked.  Performed at [facility], [place] [address], [place], [place] [address]           Radiology Studies: No results found.      Scheduled Meds:  aspirin  325 mg Oral Daily   budesonide (PULMICORT) nebulizer solution  0.5 mg Nebulization BID   cholecalciferol  1,000 Units Oral q morning   clonazePAM  1 mg Oral QHS   diltiazem  120 mg Oral q morning   DULoxetine  60 mg Oral QHS   enoxaparin (LOVENOX) injection  40 mg Subcutaneous Q24H    fluticasone  2 spray Each Nare Daily   guaiFENesin  1,200 mg Oral BID   hydrALAZINE  25 mg Oral BID   ipratropium-albuterol  3 mL Nebulization Q6H   loratadine  10 mg Oral Daily   oseltamivir  30 mg Oral BID   pantoprazole  40 mg Oral q morning   predniSONE  40 mg Oral Q breakfast   Continuous Infusions:   LOS: 3 days        Hosie Poisson, MD [facility]   To contact the attending provider between 7A-7P or the covering provider during after hours 7P-7A, please log into the web site [email or web address] and access using universal  password for that web site. If you do not have the password, please call the hospital operator.  11/17/2021, 4:45 PM

## 2021-11-17 NOTE — Care Management Important Message (Signed)
Important Message  Patient Details IM Letter placed in patients room. Name: [name] MRN: [id number] Date of Birth: 23-Feb-1956   Medicare Important Message Given:  Yes     Kerin Salen 11/17/2021, 1:26 PM

## 2021-11-18 DIAGNOSIS — J441 Chronic obstructive pulmonary disease with (acute) exacerbation: Secondary | ICD-10-CM | POA: Diagnosis not present

## 2021-11-18 DIAGNOSIS — J101 Influenza due to other identified influenza virus with other respiratory manifestations: Secondary | ICD-10-CM | POA: Diagnosis not present

## 2021-11-18 DIAGNOSIS — J9601 Acute respiratory failure with hypoxia: Secondary | ICD-10-CM | POA: Diagnosis not present

## 2021-11-18 LAB — BASIC METABOLIC PANEL
Anion gap: 9 (ref 5–15)
BUN: 18 mg/dL (ref 8–23)
CO2: 29 mmol/L (ref 22–32)
Calcium: 8.7 mg/dL — ABNORMAL LOW (ref 8.9–10.3)
Chloride: 97 mmol/L — ABNORMAL LOW (ref 98–111)
Creatinine, Ser: 0.63 mg/dL (ref 0.44–1.00)
GFR, Estimated: >60 mL/min (ref >60)
Glucose, Bld: 96 mg/dL (ref 70–99)
Potassium: 3.7 mmol/L (ref 3.5–5.1)
Sodium: 135 mmol/L (ref 135–145)

## 2021-11-18 LAB — CBC WITH DIFFERENTIAL/PLATELET
Abs Immature Granulocytes: 0.1 10*3/uL — ABNORMAL HIGH (ref 0.00–0.07)
Basophils Absolute: 0 10*3/uL (ref 0.0–0.1)
Basophils Relative: 0 %
Eosinophils Absolute: 0 10*3/uL (ref 0.0–0.5)
Eosinophils Relative: 0 %
HCT: 43.4 % (ref 36.0–46.0)
Hemoglobin: 13.9 g/dL (ref 12.0–15.0)
Immature Granulocytes: 1 %
Lymphocytes Relative: 21 %
Lymphs Abs: 2.8 10*3/uL (ref 0.7–4.0)
MCH: 28.1 pg (ref 26.0–34.0)
MCHC: 32 g/dL (ref 30.0–36.0)
MCV: 87.9 fL (ref 80.0–100.0)
Monocytes Absolute: 1 10*3/uL (ref 0.1–1.0)
Monocytes Relative: 7 %
Neutro Abs: 9.4 10*3/uL — ABNORMAL HIGH (ref 1.7–7.7)
Neutrophils Relative %: 71 %
Platelets: 227 10*3/uL (ref 150–400)
RBC: 4.94 MIL/uL (ref 3.87–5.11)
RDW: 13.3 % (ref 11.5–15.5)
WBC: 13.4 10*3/uL — ABNORMAL HIGH (ref 4.0–10.5)
nRBC: 0 % (ref 0.0–0.2)

## 2021-11-18 MED ORDER — IPRATROPIUM-ALBUTEROL 0.5-2.5 (3) MG/3ML IN SOLN
3.0000 mL | Freq: Two times a day (BID) | RESPIRATORY_TRACT | Status: DC
  Administered 2021-11-19: 3 mL via RESPIRATORY_TRACT
  Filled 2021-11-18 (×3): qty 3

## 2021-11-18 MED ORDER — ALBUTEROL SULFATE HFA 108 (90 BASE) MCG/ACT IN AERS: 2.0000 | INHALATION_SPRAY | Freq: Four times a day (QID) | RESPIRATORY_TRACT | 2 refills | Status: AC | PRN

## 2021-11-18 MED ORDER — IPRATROPIUM-ALBUTEROL 0.5-2.5 (3) MG/3ML IN SOLN: 3.0000 mL | Freq: Four times a day (QID) | RESPIRATORY_TRACT | 2 refills | Status: DC | PRN

## 2021-11-18 MED ORDER — AZITHROMYCIN 250 MG PO TABS: 500.0000 mg | ORAL_TABLET | Freq: Every day | ORAL | 0 refills | Status: AC

## 2021-11-18 MED ORDER — AZITHROMYCIN 250 MG PO TABS
500.0000 mg | ORAL_TABLET | Freq: Every day | ORAL | Status: DC
  Administered 2021-11-18 – 2021-11-19 (×2): 500 mg via ORAL
  Filled 2021-11-18 (×2): qty 2

## 2021-11-18 MED ORDER — MOMETASONE FURO-FORMOTEROL FUM 100-5 MCG/ACT IN AERO
2.0000 | INHALATION_SPRAY | Freq: Two times a day (BID) | RESPIRATORY_TRACT | Status: DC
  Administered 2021-11-18 – 2021-11-19 (×2): 2 via RESPIRATORY_TRACT
  Filled 2021-11-18: qty 8.8

## 2021-11-18 MED ORDER — MOMETASONE FURO-FORMOTEROL FUM 100-5 MCG/ACT IN AERO: 2.0000 | INHALATION_SPRAY | Freq: Two times a day (BID) | RESPIRATORY_TRACT | 1 refills | Status: DC

## 2021-11-18 NOTE — TOC Transition Note (Signed)
Transition of Care [facility]) - CM/SW Discharge Note   Patient Details  Name: [name] MRN: [id number] Date of Birth: 1956/10/02  [facility] CM/SW Contact:  Trish Mage, LCSW Phone Number: 11/18/2021, 12:27 PM   Clinical Narrative:   Patient who is stable for d/c is in need of nebulizer, pulse ox.  Orders seen and appreciated.  Call [facility] with [employer] who will arrange for delivery of nebulizer, and will talk with patient about pulse ox as well.  Nebulizer will be delivered to room, pulse ox would be delivered to home later.  No further needs identified.  TOC sign off.    Final next level of care: Home/Self Care Barriers to Discharge: No Barriers Identified   Patient Goals and CMS Choice        Discharge Placement                       Discharge Plan and Services                                     Social Determinants of Health (SDOH) Interventions     Readmission Risk Interventions No flowsheet data found.

## 2021-11-18 NOTE — Progress Notes (Incomplete)
SATURATION QUALIFICATIONS: (This note is used to comply with regulatory documentation for home oxygen)  Patient Saturations on Room Air at Rest = ***%  Patient Saturations on Room Air while Ambulating = ***%  Patient Saturations on *** Liters of oxygen while Ambulating = ***%  Please briefly explain why patient needs home oxygen: 

## 2021-11-18 NOTE — Discharge Summary (Addendum)
Physician Discharge Summary  [name] JJO:[id number] DOB: Jul 10, 1956 DOA: 11/13/2021  PCP: Maurice Small, MD (Inactive)  Admit date: 11/13/2021 Discharge date: 11/19/2021  Admitted From: home.  Disposition:  home.   Recommendations for Outpatient Follow-up:  Follow up with PCP in 1-2 weeks Please obtain BMP/CBC in one week Please follow up with pulmonology in 2 weeks.   Discharge Condition:guarded.  CODE STATUS:Full code Diet recommendation: Heart Healthy   Brief/Interim Summary: Patient 65 year old female history of COPD, hypertension, chronic pain, SVT presented with increased cough , and fevers, sob and hypoxic on RA.  She was found to have influenza A.  She was admitted and started on tamiflu and steroids. Pt clinically improved. She reports that she has yellow productive cough. Started her on azithromycin.    Discharge Diagnoses:  Principal Problem:   Influenza A Active Problems:   Tobacco user   Hypertensive disorder   Depressive disorder   Supraventricular tachycardia ([place])   Hypokalemia   COPD with acute exacerbation (HCC)   Dehydration   Acute respiratory failure with hypoxia (HCC)   Elevated LFTs  Acute hypoxic respiratory failure sec to influenza A and copd exacerbation:  - completed the course of Tamiflu and steroids.  Continue with duo nebs and cough medication.  We were able to wean her off the oxygen.  - CXR is neg for infection.  - added azithromycin for acute bronchitis.  -she will be discharged on duonebs, dulera .      Hypertension:  BP parameters are better .      SVT:  Non this admission.  - continue with cardizem.      Hypokalemia:  Replaced. Repeat level wnl.      Tobacco abuse:  Counseled to stop smoking        Transaminitis.  - acute hep panel negative.  -Right upper quadrant ultrasound done with fatty infiltration, 2 cm ill-defined hypoechoic lesion posterior right hepatic lobe indeterminate, hypervascular lesion  identified at this location on prior CT over a year ago no substantial interval change in size suggesting benign etiology, potentially vascular malformation. Recommend outpatient follow up .      Body mass index is 32.75 kg/m. Obesity;  Increased risk of morbidity and mortality.      Depression:  Resume home meds.       Discharge Instructions  Discharge Instructions     Diet - low sodium heart healthy   Complete by: As directed    Discharge instructions   Complete by: As directed    Please follow up with PCP in one week.      Allergies as of 11/18/2021       Reactions   Midazolam Hcl Other (See Comments)   HYPER   Prednisone Other (See Comments)   HYPER, depressed, moody   Statins Other (See Comments)   Causes muscle weakness and joint pain   Ace Inhibitors Other (See Comments)   DECREASES HEARTRATE   Amoxicillin Diarrhea   Did it involve swelling of the face/tongue/throat, SOB, or low BP? No Did it involve sudden or severe rash/hives, skin peeling, or any reaction on the inside of your mouth or nose? No Did you need to seek medical attention at a hospital or doctor's office? No When did it last happen?  ~2019 or so If all above answers are "NO", may proceed with cephalosporin use.   Nsaids Other (See Comments)   AVOIDS; HX GASTRIC ULCER   Sulfa Antibiotics Rash, Other (See Comments)   JOINT  PAIN        Medication List     STOP taking these medications    ondansetron 4 MG tablet Commonly known as: Zofran       TAKE these medications    acetaminophen 500 MG tablet Commonly known as: TYLENOL Take 1,000-1,500 mg by mouth daily as needed for headache (pain).   albuterol 108 (90 Base) MCG/ACT inhaler Commonly known as: VENTOLIN HFA Inhale 2 puffs into the lungs every 6 (six) hours as needed for wheezing or shortness of breath.   alendronate 70 MG tablet Commonly known as: FOSAMAX Take 70 mg by mouth every Thursday. Take with a full glass of water  on an empty stomach.   ANTI-DIARRHEAL PLUS ANTI-GAS PO Take 2 tablets by mouth daily as needed (gas and diarrhea).   aspirin EC 81 MG tablet Take 81 mg by mouth 2 (two) times daily. Swallow whole.   azithromycin 250 MG tablet Commonly known as: ZITHROMAX Take 2 tablets (500 mg total) by mouth daily for 2 days. Start taking on: November 19, 2021   cholecalciferol 25 MCG (1000 UNIT) tablet Commonly known as: VITAMIN D Take 1,000 Units by mouth every morning.   clonazePAM 1 MG tablet Commonly known as: KLONOPIN Take 1 tablet (1 mg total) by mouth at bedtime.   dextromethorphan-guaiFENesin 10-100 MG/5ML liquid Commonly known as: ROBITUSSIN-DM Take 10 mLs by mouth every 4 (four) hours as needed for cough.   diltiazem 120 MG 24 hr capsule Commonly known as: CARDIZEM CD TAKE 1 CAPSULE (120 MG TOTAL) BY MOUTH DAILY. PLEASE SCHEDULE APPT FOR FUTURE REFILLS. 1ST ATTEMPT What changed: when to take this   DULoxetine 60 MG capsule Commonly known as: CYMBALTA Take 60 mg by mouth at bedtime.   hydrALAZINE 25 MG tablet Commonly known as: APRESOLINE Take 25 mg by mouth 2 (two) times daily.   ipratropium-albuterol 0.5-2.5 (3) MG/3ML Soln Commonly known as: DUONEB Take 3 mLs by nebulization every 6 (six) hours as needed.   mometasone-formoterol 100-5 MCG/ACT Aero Commonly known as: DULERA Inhale 2 puffs into the lungs 2 (two) times daily.   nitroGLYCERIN 0.4 MG SL tablet Commonly known as: NITROSTAT Place 1 tablet (0.4 mg total) under the tongue every 5 (five) minutes x 3 doses as needed for chest pain.   pantoprazole 40 MG tablet Commonly known as: PROTONIX Take 1 tablet (40 mg total) by mouth 2 (two) times daily. What changed: when to take this   [place] 1 application into the nose at bedtime as needed (congestion).               Durable Medical Equipment  (From admission, onward)           Start     Ordered   11/18/21 1122  For home use only DME  Nebulizer machine  Once       Question Answer Comment  Patient needs a nebulizer to treat with the following condition COPD with acute exacerbation ([place])   Length of Need Lifetime      11/18/21 1121   11/18/21 1122  For home use only DME Pulse oximeter  Once        11/18/21 1121            Follow-up Information     Maurice Small, MD. Schedule an appointment as soon as possible for a visit in 1 week(s).   Specialty: Family Medicine Contact information: [address] [place] [address] [phone]  Allergies  Allergen Reactions   Midazolam Hcl Other (See Comments)    HYPER   Prednisone Other (See Comments)    HYPER, depressed, moody   Statins Other (See Comments)    Causes muscle weakness and joint pain   Ace Inhibitors Other (See Comments)    DECREASES HEARTRATE   Amoxicillin Diarrhea    Did it involve swelling of the face/tongue/throat, SOB, or low BP? No Did it involve sudden or severe rash/hives, skin peeling, or any reaction on the inside of your mouth or nose? No Did you need to seek medical attention at a hospital or doctor's office? No When did it last happen?  ~2019 or so If all above answers are "NO", may proceed with cephalosporin use.    Nsaids Other (See Comments)    AVOIDS; HX GASTRIC ULCER   Sulfa Antibiotics Rash and Other (See Comments)    JOINT PAIN    Consultations: Palliative care.    Procedures/Studies: DG Chest 2 View  Result Date: 11/15/2021 CLINICAL DATA:  Cough and shortness of breath. EXAM: CHEST - 2 VIEW COMPARISON:  Chest x-ray 11/13/2021. FINDINGS: The heart size and mediastinal contours are within normal limits. There is no pleural effusion or pneumothorax. There is minimal focal opacity in the left suprahilar region, indeterminate. The lungs are otherwise clear. Cervical spinal fusion plate is again seen. Cervical spinal fusion plate is again noted. No acute fractures. IMPRESSION: 1. Minimal  left suprahilar opacity, indeterminate. Findings may be related to confluence of normal structures, airspace disease, or other etiologies. Followup PA and lateral chest X-ray is recommended in 3-4 weeks following trial of antibiotic therapy to ensure resolution and exclude underlying malignancy. Electronically Signed   By: Ronney Asters M.D.   On: 11/15/2021 15:47   DG Chest Portable 1 View  Result Date: 11/13/2021 CLINICAL DATA:  Chest congestion, cough, and shortness of breath for several weeks, worsened symptoms over last few days with new fever. History COPD, hypertension EXAM: PORTABLE CHEST 1 VIEW COMPARISON:  Portable exam 1200 hours compared to 01/19/2021 FINDINGS: Normal heart size, mediastinal contours, and pulmonary vascularity. Atherosclerotic calcification aorta. Lungs clear. No infiltrate, pleural effusion, or pneumothorax. Osseous demineralization with prior cervicothoracic fusion. IMPRESSION: No acute abnormalities. Aortic Atherosclerosis (ICD10-I70.0). Electronically Signed   By: Lavonia Dana M.D.   On: 11/13/2021 12:39   US Abdomen Limited RUQ (LIVER/GB)  Result Date: 11/14/2021 CLINICAL DATA:  Elevated LFTs. EXAM: ULTRASOUND ABDOMEN LIMITED RIGHT UPPER QUADRANT COMPARISON:  CT scan 02/16/2020 FINDINGS: Gallbladder: Surgically absent. Common bile duct: Diameter: Upper normal to mildly increased for patient age at 7 mm. Liver: Diffusely increased hepatic echogenicity. 2 cm ill-defined hypoechoic lesion noted posterior right liver, similar location to vascular anomaly seen on previous CT image ring 1.9 cm at that time. Portal vein is patent on color Doppler imaging with normal direction of blood flow towards the liver. Other: None. IMPRESSION: 1. Diffusely increased hepatic echogenicity suggests fatty infiltration. 2. 2 cm ill-defined hypoechoic lesion posterior right hepatic lobe, indeterminate. Hypervascular lesion was identified at this location on prior CT exam over 1 year ago with no  substantial interval change in size suggesting benign etiology, potentially vascular malformation. Electronically Signed   By: Misty Stanley M.D.   On: 11/14/2021 07:01      Subjective: No new complaints.   Discharge Exam: Vitals:   11/18/21 0836 11/18/21 1519  BP:  118/61  Pulse:  66  Resp:  20  Temp:  98.1 F (36.7 C)  SpO2: 96%  94%   Vitals:   11/18/21 0537 11/18/21 0630 11/18/21 0836 11/18/21 1519  BP: (!) 171/83 133/63  118/61  Pulse: 98   66  Resp: 18   20  Temp: 98 F (36.7 C)   98.1 F (36.7 C)  TempSrc: Oral   Oral  SpO2: 91%  96% 94%  Weight:      Height:        General: Pt is alert, awake, not in acute distress Cardiovascular: RRR, S1/S2 +, no rubs, no gallops Respiratory: CTA bilaterally, no wheezing, no rhonchi Abdominal: Soft, NT, ND, bowel sounds + Extremities: no edema, no cyanosis    The results of significant diagnostics from this hospitalization (including imaging, microbiology, ancillary and laboratory) are listed below for reference.     Microbiology: Recent Results (from the past 240 hour(s))  Resp Panel by RT-PCR (Flu A&B, Covid) Nasopharyngeal Swab     Status: Abnormal   Collection Time: 11/13/21 11:57 AM   Specimen: Nasopharyngeal Swab; Nasopharyngeal(NP) swabs in vial transport medium  Result Value Ref Range Status   SARS Coronavirus 2 by RT PCR NEGATIVE NEGATIVE Final    Comment: (NOTE) SARS-CoV-2 target nucleic acids are NOT DETECTED.  The SARS-CoV-2 RNA is generally detectable in upper respiratory specimens during the acute phase of infection. The lowest concentration of SARS-CoV-2 viral copies this assay can detect is 138 copies/mL. A negative result does not preclude SARS-Cov-2 infection and should not be used as the sole basis for treatment or other patient management decisions. A negative result may occur with  improper specimen collection/handling, submission of specimen other than nasopharyngeal swab, presence of viral  mutation(s) within the areas targeted by this assay, and inadequate number of viral copies(<138 copies/mL). A negative result must be combined with clinical observations, patient history, and epidemiological information. The expected result is Negative.  Fact Sheet for Patients:  EntrepreneurPulse.com.au  Fact Sheet for Healthcare Providers:  IncredibleEmployment.be  This test is no t yet approved or cleared by the [place] FDA and  has been authorized for detection and/or diagnosis of SARS-CoV-2 by FDA under an Emergency Use Authorization (EUA). This EUA will remain  in effect (meaning this test can be used) for the duration of the COVID-19 declaration under Section 564(b)(1) of the Act, 21 U.S.C.section 360bbb-3(b)(1), unless the authorization is terminated  or revoked sooner.       Influenza A by PCR POSITIVE (A) NEGATIVE Final   Influenza B by PCR NEGATIVE NEGATIVE Final    Comment: (NOTE) The Xpert Xpress SARS-CoV-2/FLU/RSV plus assay is intended as an aid in the diagnosis of influenza from Nasopharyngeal swab specimens and should not be used as a sole basis for treatment. Nasal washings and aspirates are unacceptable for Xpert Xpress SARS-CoV-2/FLU/RSV testing.  Fact Sheet for Patients: EntrepreneurPulse.com.au  Fact Sheet for Healthcare Providers: IncredibleEmployment.be  This test is not yet approved or cleared by the [place] FDA and has been authorized for detection and/or diagnosis of SARS-CoV-2 by FDA under an Emergency Use Authorization (EUA). This EUA will remain in effect (meaning this test can be used) for the duration of the COVID-19 declaration under Section 564(b)(1) of the Act, 21 U.S.C. section 360bbb-3(b)(1), unless the authorization is terminated or revoked.  Performed at [facility], [place] [address] [place], [place] [address]      Labs: BNP  (last 3 results) Recent Labs    11/13/21 1157  BNP 67.1   Basic Metabolic Panel: Recent Labs  Lab 11/13/21 1157 11/14/21  0000 11/15/21 0505 11/16/21 0520 11/18/21 0536  NA 137 138 135 140 135  K 3.2* 3.4* 4.8 4.6 3.7  CL 99 100 103 106 97*  CO2 29 26 27 23 29   GLUCOSE 133* 188* 123* 99 96  BUN 13 22 31* 32* 18  CREATININE 0.88 1.11*  1.01* 0.97 0.86 0.63  CALCIUM 8.8* 9.2 9.7 9.6 8.7*  MG  --  2.0 2.0 2.0  --    Liver Function Tests: Recent Labs  Lab 11/13/21 1157 11/14/21 0000 11/15/21 0505  AST 97* 73* 49*  ALT 82* 70* 50*  ALKPHOS 120 98 79  BILITOT 0.9 0.6 0.6  PROT 7.8 7.3 6.3*  ALBUMIN 4.6 4.1 3.6   No results for input(s): LIPASE, AMYLASE in the last 168 hours. No results for input(s): AMMONIA in the last 168 hours. CBC: Recent Labs  Lab 11/14/21 0000 11/15/21 0505 11/16/21 0520 11/18/21 0536  WBC 8.6 17.7* 14.9* 13.4*  NEUTROABS  --  14.4* 10.6* 9.4*  HGB 14.6 13.1 13.5 13.9  HCT 44.7 41.6 43.4 43.4  MCV 88.0 89.3 91.4 87.9  PLT 207 221 196 227   Cardiac Enzymes: No results for input(s): CKTOTAL, CKMB, CKMBINDEX, TROPONINI in the last 168 hours. BNP: Invalid input(s): POCBNP CBG: No results for input(s): GLUCAP in the last 168 hours. D-Dimer No results for input(s): DDIMER in the last 72 hours. Hgb A1c No results for input(s): HGBA1C in the last 72 hours. Lipid Profile No results for input(s): CHOL, HDL, LDLCALC, TRIG, CHOLHDL, LDLDIRECT in the last 72 hours. Thyroid function studies No results for input(s): TSH, T4TOTAL, T3FREE, THYROIDAB in the last 72 hours.  Invalid input(s): FREET3 Anemia work up No results for input(s): VITAMINB12, FOLATE, FERRITIN, TIBC, IRON, RETICCTPCT in the last 72 hours. Urinalysis    Component Value Date/Time   COLORURINE YELLOW 01/19/2021 1139   APPEARANCEUR CLEAR 01/19/2021 1139   LABSPEC 1.023 01/19/2021 1139   PHURINE 5.0 01/19/2021 1139   GLUCOSEU NEGATIVE 01/19/2021 1139   HGBUR NEGATIVE  01/19/2021 1139   BILIRUBINUR NEGATIVE 01/19/2021 1139   KETONESUR NEGATIVE 01/19/2021 1139   PROTEINUR 100 (A) 01/19/2021 1139   UROBILINOGEN 0.2 09/17/2008 2342   NITRITE NEGATIVE 01/19/2021 1139   LEUKOCYTESUR NEGATIVE 01/19/2021 1139   Sepsis Labs Invalid input(s): PROCALCITONIN,  WBC,  LACTICIDVEN Microbiology Recent Results (from the past 240 hour(s))  Resp Panel by RT-PCR (Flu A&B, Covid) Nasopharyngeal Swab     Status: Abnormal   Collection Time: 11/13/21 11:57 AM   Specimen: Nasopharyngeal Swab; Nasopharyngeal(NP) swabs in vial transport medium  Result Value Ref Range Status   SARS Coronavirus 2 by RT PCR NEGATIVE NEGATIVE Final    Comment: (NOTE) SARS-CoV-2 target nucleic acids are NOT DETECTED.  The SARS-CoV-2 RNA is generally detectable in upper respiratory specimens during the acute phase of infection. The lowest concentration of SARS-CoV-2 viral copies this assay can detect is 138 copies/mL. A negative result does not preclude SARS-Cov-2 infection and should not be used as the sole basis for treatment or other patient management decisions. A negative result may occur with  improper specimen collection/handling, submission of specimen other than nasopharyngeal swab, presence of viral mutation(s) within the areas targeted by this assay, and inadequate number of viral copies(<138 copies/mL). A negative result must be combined with clinical observations, patient history, and epidemiological information. The expected result is Negative.  Fact Sheet for Patients:  EntrepreneurPulse.com.au  Fact Sheet for Healthcare Providers:  IncredibleEmployment.be  This test is no t yet approved  or cleared by the [place] and  has been authorized for detection and/or diagnosis of SARS-CoV-2 by FDA under an Emergency Use Authorization (EUA). This EUA will remain  in effect (meaning this test can be used) for the duration of the COVID-19  declaration under Section 564(b)(1) of the Act, 21 U.S.C.section 360bbb-3(b)(1), unless the authorization is terminated  or revoked sooner.       Influenza A by PCR POSITIVE (A) NEGATIVE Final   Influenza B by PCR NEGATIVE NEGATIVE Final    Comment: (NOTE) The Xpert Xpress SARS-CoV-2/FLU/RSV plus assay is intended as an aid in the diagnosis of influenza from Nasopharyngeal swab specimens and should not be used as a sole basis for treatment. Nasal washings and aspirates are unacceptable for Xpert Xpress SARS-CoV-2/FLU/RSV testing.  Fact Sheet for Patients: EntrepreneurPulse.com.au  Fact Sheet for Healthcare Providers: IncredibleEmployment.be  This test is not yet approved or cleared by the [place] FDA and has been authorized for detection and/or diagnosis of SARS-CoV-2 by FDA under an Emergency Use Authorization (EUA). This EUA will remain in effect (meaning this test can be used) for the duration of the COVID-19 declaration under Section 564(b)(1) of the Act, 21 U.S.C. section 360bbb-3(b)(1), unless the authorization is terminated or revoked.  Performed at [facility], [place] [address] [place], [place] [address]      Time coordinating discharge: 38 minutes.   SIGNED:   Hosie Poisson, MD  [facility] 11/18/2021, 7:28 PM

## 2021-11-18 NOTE — Progress Notes (Signed)
PROGRESS NOTE    [name]  BBC:[id number] DOB: [birth year] DOA: 11/13/2021 PCP: Maurice Small, MD (Inactive)    Chief Complaint  Patient presents with   Shortness of Breath    Brief Narrative:  Patient 65 year old female history of COPD, hypertension, chronic pain, SVT presented with increased cough , and fevers, sob and hypoxic on RA.  She was found to have influenza A.  She was admitted and started on tamiflu and steroids. Pt clinically improved. She reports that she has yellow productive cough. Started her on azithromycin.  Pt reported that she does not have any means to get home. Her sister is not home .    Assessment & Plan:   Principal Problem:   Influenza A Active Problems:   Tobacco user   Hypertensive disorder   Depressive disorder   Supraventricular tachycardia ([place])   Hypokalemia   COPD with acute exacerbation (HCC)   Dehydration   Acute respiratory failure with hypoxia (HCC)   Elevated LFTs   Acute hypoxic respiratory failure sec to influenza A and copd exacerbation:  - completed the course of Tamiflu and steroids.  Continue with duo nebs and cough medication.  We were able to wean her off the oxygen.  - CXR is neg for infection.  - added azithromycin for acute bronchitis.  -she will be discharged on duonebs, dulera .    Hypertension:  BP parameters are improving.    SVT:  Non this admission.  - continue with cardizem.    Hypokalemia:  Replaced. Repeat level wnl.    Tobacco abuse:  Counseled to stop smoking     Transaminitis.  - acute hep panel negative.  -Right upper quadrant ultrasound done with fatty infiltration, 2 cm ill-defined hypoechoic lesion posterior right hepatic lobe indeterminate, hypervascular lesion identified at this location on prior CT over a year ago no substantial interval change in size suggesting benign etiology, potentially vascular malformation. Recommend outpatient follow up .    Body mass index is 32.75  kg/m. Obesity;  Increased risk of morbidity and mortality.    Depression:  Resume home meds.    DVT prophylaxis: (Lovenox) Code Status: (Full/ Code) Family Communication: none at bedside.  Disposition:   Status is: Inpatient  Remains inpatient appropriate because: unsafe d/c plan, no family to take her home. Wants to go home tomorrow.      Consultants:  None.  Procedures: none.   Antimicrobials:  Anti-infectives (From admission, onward)    Start     Dose/Rate Route Frequency Ordered Stop   11/19/21 0000  azithromycin (ZITHROMAX) 250 MG tablet        500 mg Oral Daily 11/18/21 1127 11/21/21 2359   11/18/21 1200  azithromycin (ZITHROMAX) tablet 500 mg        500 mg Oral Daily 11/18/21 1109 11/21/21 0959   11/14/21 1000  oseltamivir (TAMIFLU) capsule 75 mg  Status:  Discontinued        75 mg Oral 2 times daily 11/13/21 2340 11/14/21 0038   11/14/21 1000  oseltamivir (TAMIFLU) capsule 30 mg        30 mg Oral 2 times daily 11/14/21 0039 11/19/21 0959   11/13/21 1600  oseltamivir (TAMIFLU) capsule 75 mg        75 mg Oral  Once 11/13/21 1552 11/13/21 1624         Subjective: She appears to be back to baseline.   Objective: Vitals:   11/17/21 2035 11/18/21 0537 11/18/21 0630 11/18/21 0836  BP: Marland Kitchen)  153/76 (!) 171/83 133/63   Pulse: 70 98    Resp: 20 18    Temp: 98.1 F (36.7 C) 98 F (36.7 C)    TempSrc: Oral Oral    SpO2: 92% 91%  96%  Weight:      Height:        Intake/Output Summary (Last 24 hours) at 11/18/2021 1450 Last data filed at 11/18/2021 1229 Gross per 24 hour  Intake 1180 ml  Output --  Net 1180 ml   Filed Weights   11/16/21 0434 11/16/21 2009 11/17/21 0500  Weight: 74.1 kg 72.3 kg 72.3 kg    Examination:  General exam: Appears calm and comfortable  Respiratory system: Clear to auscultation. Respiratory effort normal. Cardiovascular system: S1 & S2 heard, RRR. No JVD, No pedal edema. Gastrointestinal system: Abdomen is nondistended,  soft and nontender. Normal bowel sounds heard. Central nervous system: Alert and oriented. No focal neurological deficits. Extremities: Symmetric 5 x 5 power. Skin: No rashes, lesions or ulcers Psychiatry:  Mood & affect appropriate.       Data Reviewed: I have personally reviewed following labs and imaging studies  CBC: Recent Labs  Lab 11/14/21 0000 11/15/21 0505 11/16/21 0520 11/18/21 0536  WBC 8.6 17.7* 14.9* 13.4*  NEUTROABS  --  14.4* 10.6* 9.4*  HGB 14.6 13.1 13.5 13.9  HCT 44.7 41.6 43.4 43.4  MCV 88.0 89.3 91.4 87.9  PLT 207 221 196 227     Basic Metabolic Panel: Recent Labs  Lab 11/13/21 1157 11/14/21 0000 11/15/21 0505 11/16/21 0520 11/18/21 0536  NA 137 138 135 140 135  K 3.2* 3.4* 4.8 4.6 3.7  CL 99 100 103 106 97*  CO2 29 26 27 23 29   GLUCOSE 133* 188* 123* 99 96  BUN 13 22 31* 32* 18  CREATININE 0.88 1.11*  1.01* 0.97 0.86 0.63  CALCIUM 8.8* 9.2 9.7 9.6 8.7*  MG  --  2.0 2.0 2.0  --      GFR: Estimated Creatinine Clearance: 58.4 mL/min (by C-G formula based on SCr of 0.63 mg/dL).  Liver Function Tests: Recent Labs  Lab 11/13/21 1157 11/14/21 0000 11/15/21 0505  AST 97* 73* 49*  ALT 82* 70* 50*  ALKPHOS 120 98 79  BILITOT 0.9 0.6 0.6  PROT 7.8 7.3 6.3*  ALBUMIN 4.6 4.1 3.6     CBG: No results for input(s): GLUCAP in the last 168 hours.   Recent Results (from the past 240 hour(s))  Resp Panel by RT-PCR (Flu A&B, Covid) Nasopharyngeal Swab     Status: Abnormal   Collection Time: 11/13/21 11:57 AM   Specimen: Nasopharyngeal Swab; Nasopharyngeal(NP) swabs in vial transport medium  Result Value Ref Range Status   SARS Coronavirus 2 by RT PCR NEGATIVE NEGATIVE Final    Comment: (NOTE) SARS-CoV-2 target nucleic acids are NOT DETECTED.  The SARS-CoV-2 RNA is generally detectable in upper respiratory specimens during the acute phase of infection. The lowest concentration of SARS-CoV-2 viral copies this assay can detect is 138  copies/mL. A negative result does not preclude SARS-Cov-2 infection and should not be used as the sole basis for treatment or other patient management decisions. A negative result may occur with  improper specimen collection/handling, submission of specimen other than nasopharyngeal swab, presence of viral mutation(s) within the areas targeted by this assay, and inadequate number of viral copies(<138 copies/mL). A negative result must be combined with clinical observations, patient history, and epidemiological information. The expected result is Negative.  Fact Sheet  for Patients:  EntrepreneurPulse.com.au  Fact Sheet for Healthcare Providers:  IncredibleEmployment.be  This test is no t yet approved or cleared by the [place] FDA and  has been authorized for detection and/or diagnosis of SARS-CoV-2 by FDA under an Emergency Use Authorization (EUA). This EUA will remain  in effect (meaning this test can be used) for the duration of the COVID-19 declaration under Section 564(b)(1) of the Act, 21 U.S.C.section 360bbb-3(b)(1), unless the authorization is terminated  or revoked sooner.       Influenza A by PCR POSITIVE (A) NEGATIVE Final   Influenza B by PCR NEGATIVE NEGATIVE Final    Comment: (NOTE) The Xpert Xpress SARS-CoV-2/FLU/RSV plus assay is intended as an aid in the diagnosis of influenza from Nasopharyngeal swab specimens and should not be used as a sole basis for treatment. Nasal washings and aspirates are unacceptable for Xpert Xpress SARS-CoV-2/FLU/RSV testing.  Fact Sheet for Patients: EntrepreneurPulse.com.au  Fact Sheet for Healthcare Providers: IncredibleEmployment.be  This test is not yet approved or cleared by the [place] FDA and has been authorized for detection and/or diagnosis of SARS-CoV-2 by FDA under an Emergency Use Authorization (EUA). This EUA will remain in effect  (meaning this test can be used) for the duration of the COVID-19 declaration under Section 564(b)(1) of the Act, 21 U.S.C. section 360bbb-3(b)(1), unless the authorization is terminated or revoked.  Performed at [facility], [place] [address], [place], [place] [address]           Radiology Studies: No results found.      Scheduled Meds:  aspirin  325 mg Oral Daily   azithromycin  500 mg Oral Daily   cholecalciferol  1,000 Units Oral q morning   clonazePAM  1 mg Oral QHS   diltiazem  120 mg Oral q morning   DULoxetine  60 mg Oral QHS   enoxaparin (LOVENOX) injection  40 mg Subcutaneous Q24H   fluticasone  2 spray Each Nare Daily   guaiFENesin  1,200 mg Oral BID   hydrALAZINE  25 mg Oral BID   ipratropium-albuterol  3 mL Nebulization BID   loratadine  10 mg Oral Daily   mometasone-formoterol  2 puff Inhalation BID   oseltamivir  30 mg Oral BID   pantoprazole  40 mg Oral q morning   Continuous Infusions:   LOS: 4 days        Hosie Poisson, MD [facility]   To contact the attending provider between 7A-7P or the covering provider during after hours 7P-7A, please log into the web site [email or web address] and access using universal [place] password for that web site. If you do not have the password, please call the hospital operator.  11/18/2021, 2:50 PM

## 2021-11-28 DIAGNOSIS — J449 Chronic obstructive pulmonary disease, unspecified: Secondary | ICD-10-CM | POA: Diagnosis not present

## 2021-11-28 DIAGNOSIS — K760 Fatty (change of) liver, not elsewhere classified: Secondary | ICD-10-CM | POA: Diagnosis not present

## 2021-11-28 DIAGNOSIS — J988 Other specified respiratory disorders: Secondary | ICD-10-CM | POA: Diagnosis not present

## 2021-11-28 DIAGNOSIS — J111 Influenza due to unidentified influenza virus with other respiratory manifestations: Secondary | ICD-10-CM | POA: Diagnosis not present

## 2021-11-28 DIAGNOSIS — Z09 Encounter for follow-up examination after completed treatment for conditions other than malignant neoplasm: Secondary | ICD-10-CM | POA: Diagnosis not present

## 2021-11-28 DIAGNOSIS — I10 Essential (primary) hypertension: Secondary | ICD-10-CM | POA: Diagnosis not present

## 2021-11-28 DIAGNOSIS — G4700 Insomnia, unspecified: Secondary | ICD-10-CM | POA: Diagnosis not present

## 2021-12-07 ENCOUNTER — Ambulatory Visit
Admission: RE | Admit: 2021-12-07 | Discharge: 2021-12-07 | Disposition: A | Discharge status: Home / Self Care | Payer: Medicare Other | Source: Ambulatory Visit | Attending: Family Medicine | Admitting: Family Medicine

## 2021-12-07 ENCOUNTER — Other Ambulatory Visit: Payer: Self-pay

## 2021-12-07 ENCOUNTER — Other Ambulatory Visit: Payer: Self-pay | Admitting: Family Medicine

## 2021-12-07 DIAGNOSIS — R059 Cough, unspecified: Secondary | ICD-10-CM | POA: Diagnosis not present

## 2021-12-07 DIAGNOSIS — R0602 Shortness of breath: Secondary | ICD-10-CM | POA: Diagnosis not present

## 2021-12-07 DIAGNOSIS — J988 Other specified respiratory disorders: Secondary | ICD-10-CM

## 2021-12-07 DIAGNOSIS — J449 Chronic obstructive pulmonary disease, unspecified: Secondary | ICD-10-CM | POA: Diagnosis not present

## 2021-12-09 DIAGNOSIS — U071 COVID-19: Secondary | ICD-10-CM | POA: Diagnosis not present

## 2021-12-09 DIAGNOSIS — R052 Subacute cough: Secondary | ICD-10-CM | POA: Diagnosis not present

## 2021-12-09 DIAGNOSIS — J441 Chronic obstructive pulmonary disease with (acute) exacerbation: Secondary | ICD-10-CM | POA: Diagnosis not present

## 2021-12-30 DIAGNOSIS — R0781 Pleurodynia: Secondary | ICD-10-CM | POA: Diagnosis not present

## 2021-12-30 DIAGNOSIS — T148XXA Other injury of unspecified body region, initial encounter: Secondary | ICD-10-CM | POA: Diagnosis not present

## 2022-01-04 DIAGNOSIS — M810 Age-related osteoporosis without current pathological fracture: Secondary | ICD-10-CM | POA: Diagnosis not present

## 2022-01-04 DIAGNOSIS — M170 Bilateral primary osteoarthritis of knee: Secondary | ICD-10-CM | POA: Diagnosis not present

## 2022-01-04 DIAGNOSIS — E782 Mixed hyperlipidemia: Secondary | ICD-10-CM | POA: Diagnosis not present

## 2022-01-04 DIAGNOSIS — F331 Major depressive disorder, recurrent, moderate: Secondary | ICD-10-CM | POA: Diagnosis not present

## 2022-01-04 DIAGNOSIS — I10 Essential (primary) hypertension: Secondary | ICD-10-CM | POA: Diagnosis not present

## 2022-01-04 DIAGNOSIS — J441 Chronic obstructive pulmonary disease with (acute) exacerbation: Secondary | ICD-10-CM | POA: Diagnosis not present

## 2022-01-04 DIAGNOSIS — G4700 Insomnia, unspecified: Secondary | ICD-10-CM | POA: Diagnosis not present

## 2022-01-04 DIAGNOSIS — M1990 Unspecified osteoarthritis, unspecified site: Secondary | ICD-10-CM | POA: Diagnosis not present

## 2022-01-17 DIAGNOSIS — Z20822 Contact with and (suspected) exposure to covid-19: Secondary | ICD-10-CM | POA: Diagnosis not present

## 2022-01-27 DIAGNOSIS — Z4889 Encounter for other specified surgical aftercare: Secondary | ICD-10-CM | POA: Diagnosis not present

## 2022-01-30 DIAGNOSIS — M65332 Trigger finger, left middle finger: Secondary | ICD-10-CM | POA: Diagnosis not present

## 2022-01-30 DIAGNOSIS — M67449 Ganglion, unspecified hand: Secondary | ICD-10-CM | POA: Diagnosis not present

## 2022-01-30 DIAGNOSIS — M65331 Trigger finger, right middle finger: Secondary | ICD-10-CM | POA: Diagnosis not present

## 2022-02-01 DIAGNOSIS — Z20822 Contact with and (suspected) exposure to covid-19: Secondary | ICD-10-CM | POA: Diagnosis not present

## 2022-02-10 DIAGNOSIS — F331 Major depressive disorder, recurrent, moderate: Secondary | ICD-10-CM | POA: Diagnosis not present

## 2022-02-10 DIAGNOSIS — I10 Essential (primary) hypertension: Secondary | ICD-10-CM | POA: Diagnosis not present

## 2022-02-10 DIAGNOSIS — E782 Mixed hyperlipidemia: Secondary | ICD-10-CM | POA: Diagnosis not present

## 2022-02-10 DIAGNOSIS — J441 Chronic obstructive pulmonary disease with (acute) exacerbation: Secondary | ICD-10-CM | POA: Diagnosis not present

## 2022-02-13 DIAGNOSIS — Z20822 Contact with and (suspected) exposure to covid-19: Secondary | ICD-10-CM | POA: Diagnosis not present

## 2022-02-28 DIAGNOSIS — M65332 Trigger finger, left middle finger: Secondary | ICD-10-CM | POA: Diagnosis not present

## 2022-02-28 DIAGNOSIS — M65331 Trigger finger, right middle finger: Secondary | ICD-10-CM | POA: Diagnosis not present

## 2022-03-09 DIAGNOSIS — Z20822 Contact with and (suspected) exposure to covid-19: Secondary | ICD-10-CM | POA: Diagnosis not present

## 2022-03-10 DIAGNOSIS — Z20822 Contact with and (suspected) exposure to covid-19: Secondary | ICD-10-CM | POA: Diagnosis not present

## 2022-03-10 DIAGNOSIS — M67442 Ganglion, left hand: Secondary | ICD-10-CM | POA: Diagnosis not present

## 2022-03-10 DIAGNOSIS — M65331 Trigger finger, right middle finger: Secondary | ICD-10-CM | POA: Diagnosis not present

## 2022-03-10 DIAGNOSIS — M65332 Trigger finger, left middle finger: Secondary | ICD-10-CM | POA: Diagnosis not present

## 2022-03-14 ENCOUNTER — Other Ambulatory Visit: Payer: Self-pay | Admitting: Cardiology

## 2022-03-15 DIAGNOSIS — M810 Age-related osteoporosis without current pathological fracture: Secondary | ICD-10-CM | POA: Diagnosis not present

## 2022-03-15 DIAGNOSIS — I10 Essential (primary) hypertension: Secondary | ICD-10-CM | POA: Diagnosis not present

## 2022-03-15 DIAGNOSIS — E782 Mixed hyperlipidemia: Secondary | ICD-10-CM | POA: Diagnosis not present

## 2022-03-15 DIAGNOSIS — J441 Chronic obstructive pulmonary disease with (acute) exacerbation: Secondary | ICD-10-CM | POA: Diagnosis not present

## 2022-03-21 DIAGNOSIS — E7849 Other hyperlipidemia: Secondary | ICD-10-CM | POA: Diagnosis not present

## 2022-03-21 DIAGNOSIS — D649 Anemia, unspecified: Secondary | ICD-10-CM | POA: Diagnosis not present

## 2022-03-21 DIAGNOSIS — I10 Essential (primary) hypertension: Secondary | ICD-10-CM | POA: Diagnosis not present

## 2022-03-21 DIAGNOSIS — E1165 Type 2 diabetes mellitus with hyperglycemia: Secondary | ICD-10-CM | POA: Diagnosis not present

## 2022-03-21 DIAGNOSIS — E876 Hypokalemia: Secondary | ICD-10-CM | POA: Diagnosis not present

## 2022-03-21 DIAGNOSIS — R072 Precordial pain: Secondary | ICD-10-CM | POA: Diagnosis not present

## 2022-03-21 DIAGNOSIS — M48061 Spinal stenosis, lumbar region without neurogenic claudication: Secondary | ICD-10-CM | POA: Diagnosis not present

## 2022-03-21 DIAGNOSIS — R0602 Shortness of breath: Secondary | ICD-10-CM | POA: Diagnosis not present

## 2022-03-21 DIAGNOSIS — Z79899 Other long term (current) drug therapy: Secondary | ICD-10-CM | POA: Diagnosis not present

## 2022-03-28 DIAGNOSIS — Z0000 Encounter for general adult medical examination without abnormal findings: Secondary | ICD-10-CM | POA: Diagnosis not present

## 2022-03-28 DIAGNOSIS — I700 Atherosclerosis of aorta: Secondary | ICD-10-CM | POA: Diagnosis not present

## 2022-03-28 DIAGNOSIS — Z23 Encounter for immunization: Secondary | ICD-10-CM | POA: Diagnosis not present

## 2022-03-28 DIAGNOSIS — R7303 Prediabetes: Secondary | ICD-10-CM | POA: Diagnosis not present

## 2022-03-28 DIAGNOSIS — G4700 Insomnia, unspecified: Secondary | ICD-10-CM | POA: Diagnosis not present

## 2022-03-28 DIAGNOSIS — R1010 Upper abdominal pain, unspecified: Secondary | ICD-10-CM | POA: Diagnosis not present

## 2022-03-28 DIAGNOSIS — F418 Other specified anxiety disorders: Secondary | ICD-10-CM | POA: Diagnosis not present

## 2022-03-28 DIAGNOSIS — J449 Chronic obstructive pulmonary disease, unspecified: Secondary | ICD-10-CM | POA: Diagnosis not present

## 2022-03-28 DIAGNOSIS — Z1211 Encounter for screening for malignant neoplasm of colon: Secondary | ICD-10-CM | POA: Diagnosis not present

## 2022-03-28 DIAGNOSIS — E782 Mixed hyperlipidemia: Secondary | ICD-10-CM | POA: Diagnosis not present

## 2022-04-03 DIAGNOSIS — Z03818 Encounter for observation for suspected exposure to other biological agents ruled out: Secondary | ICD-10-CM | POA: Diagnosis not present

## 2022-04-03 DIAGNOSIS — J020 Streptococcal pharyngitis: Secondary | ICD-10-CM | POA: Diagnosis not present

## 2022-04-03 DIAGNOSIS — R509 Fever, unspecified: Secondary | ICD-10-CM | POA: Diagnosis not present

## 2022-04-03 DIAGNOSIS — R059 Cough, unspecified: Secondary | ICD-10-CM | POA: Diagnosis not present

## 2022-04-03 DIAGNOSIS — R5383 Other fatigue: Secondary | ICD-10-CM | POA: Diagnosis not present

## 2022-04-03 DIAGNOSIS — R52 Pain, unspecified: Secondary | ICD-10-CM | POA: Diagnosis not present

## 2022-04-03 DIAGNOSIS — J029 Acute pharyngitis, unspecified: Secondary | ICD-10-CM | POA: Diagnosis not present

## 2022-04-24 DIAGNOSIS — Z20822 Contact with and (suspected) exposure to covid-19: Secondary | ICD-10-CM | POA: Diagnosis not present

## 2022-04-26 DIAGNOSIS — J449 Chronic obstructive pulmonary disease, unspecified: Secondary | ICD-10-CM | POA: Diagnosis not present

## 2022-04-26 DIAGNOSIS — M1990 Unspecified osteoarthritis, unspecified site: Secondary | ICD-10-CM | POA: Diagnosis not present

## 2022-04-26 DIAGNOSIS — M810 Age-related osteoporosis without current pathological fracture: Secondary | ICD-10-CM | POA: Diagnosis not present

## 2022-04-26 DIAGNOSIS — G4700 Insomnia, unspecified: Secondary | ICD-10-CM | POA: Diagnosis not present

## 2022-04-26 DIAGNOSIS — E782 Mixed hyperlipidemia: Secondary | ICD-10-CM | POA: Diagnosis not present

## 2022-04-26 DIAGNOSIS — I10 Essential (primary) hypertension: Secondary | ICD-10-CM | POA: Diagnosis not present

## 2022-04-26 DIAGNOSIS — F331 Major depressive disorder, recurrent, moderate: Secondary | ICD-10-CM | POA: Diagnosis not present

## 2022-04-26 DIAGNOSIS — M170 Bilateral primary osteoarthritis of knee: Secondary | ICD-10-CM | POA: Diagnosis not present

## 2022-04-26 DIAGNOSIS — J441 Chronic obstructive pulmonary disease with (acute) exacerbation: Secondary | ICD-10-CM | POA: Diagnosis not present

## 2022-04-28 NOTE — Progress Notes (Signed)
New Hematology/Oncology Consult ? ? ?Requesting MD: Dr. London Meadows ? ?[id number] ? ?   ? ?Reason for Consult: Leukocytosis ? ?HPI: [name] is a 66 year old woman referred for evaluation of leukocytosis.  She had an annual wellness visit 03/28/2022.  CBC returned with a total white count of 19.9, 78% neutrophils, 11% lymphocytes, hemoglobin 13.2, platelet count 287,000. ? ?She saw Dr. Julien Meadows for evaluation of persistent leukocytosis in 2016.  JAK2 V617F mutation analysis returned negative on 05/27/2015; BCR/ABL not detected also 05/27/2015.  Bone marrow biopsy 09/15/2015 showed a normocellular bone marrow with trilineage hematopoiesis with no definite morphologic evidence of a myeloproliferative or myelodysplastic process; cytogenetic analysis revealed the presence of normal female chromosomes with no observable clonal chromosomal abnormalities.  She last saw Dr. Julien Meadows on 10/11/2015 with the recommendation for continued observation with routine follow-up with her PCP. ? ? ?Past Medical History:  ?Diagnosis Date  ? Acute coronary syndrome (HCC)   ? Acute exacerbation of chronic obstructive airways disease ([place])   ? Acute renal failure syndrome (HCC)   ? Agoraphobia without history of panic disorder   ? Anginal pain ([place])   ? Anxiety   ? Arthritis   ? "qwhere" (04/03/2018)  ? Bilateral carpal tunnel syndrome 02/18/2016  ? Bilateral knee pain   ? Carpal tunnel syndrome of left wrist   ? Chest pain   ? Childhood asthma   ? Chronic bronchitis ([place])   ? Chronic lower back pain   ? Colitis presumed infectious   ? COPD (chronic obstructive pulmonary disease) ([place])   ? Coronary artery disease CARDIOLOGIST- DR  [name]  (VISIT 03-30-11 W/ CHART)  ? NON-OBS. CAD   (STRESS TEST NOV. 2011  ? Depression   ? DJD (degenerative joint disease)   ? JOINT PAIN  ? Dyspnea   ? "anytime but mostly on exertion and smoking"  ? Dysrhythmia 2017  ? SVT  ? Elevated liver enzymes   ? Fibromyalgia   ? Frequency of urination   ? GERD  (gastroesophageal reflux disease)   ?  W/ NEXIUM  ? Heart murmur   ? Hemorrhoids   ? High cholesterol   ? History of blood transfusion   ? "related to anemia" (04/03/2018)  ? History of carpal tunnel syndrome   ? Right  ? History of gastric ulcer 2004  ? Hyperlipidemia   ? Hypertension   ? IBS (irritable bowel syndrome)   ? Incisional pain s/p interstim implant 1st stage--- 12-07-11   ? left upper buttock-- pt states dressing clean dry and intact (on 12-08-11)  ? Influenza due to influenza A virus   ? Insomnia   ? Internal derangement of left knee   ? Iron deficiency anemia 2004  ? Lesion of liver   ? Leukocytosis   ? Lumbar pain   ? Nocturia   ? Non-productive cough   ? Numbness in both hands AT TIMES  ? OSA (obstructive sleep apnea)   ? "suppose to wear mask; I don't" (04/03/2018)  ? Osteoarthritis of left knee   ? PONV (postoperative nausea and vomiting)   ? Post laminectomy syndrome   ? Preinfarction syndrome ([place])   ? Supraventricular tachycardia ([place])   ? SVT (supraventricular tachycardia) ([place])   ? Tobacco user   ? Urge urinary incontinence   ?: ? ? ?Past Surgical History:  ?Procedure Laterality Date  ? ABDOMINAL EXPOSURE N/A 01/26/2021  ? Procedure: ABDOMINAL EXPOSURE;  Surgeon: Brenda Posner, MD;  Location: [facility];  Service:  Vascular;  Laterality: N/A;  ? ANTERIOR CERVICAL DECOMP/DISCECTOMY FUSION  2008  ? C4 - T1 ("screws from 1st OR came out")  ? ANTERIOR CERVICAL DECOMP/DISCECTOMY FUSION  2002  ? C4 - 7  ? ANTERIOR LATERAL LUMBAR FUSION WITH PERCUTANEOUS SCREW 2 LEVEL Left 11/27/2018  ? Procedure: XLIF Lumbar 3-5, posterior spinal fusion L3-5;  Surgeon: Brenda Schools, MD;  Location: [facility];  Service: Orthopedics;  Laterality: Left;  5.5 hrs for entire procedure  ? ANTERIOR LUMBAR FUSION N/A 01/26/2021  ? Procedure: ANTERIOR LUMBAR FUSION LUMBAR FIVE-SACRAL ONE WITH POSTERIOR SPINAL FUSION INTERBODY (EXTENSION OF PREVIOUS FUSION);  Surgeon: Brenda Schools, MD;  Location: [facility];  Service: Orthopedics;   Laterality: N/A;  6hrs ?Dr. Donnetta Meadows to do approach ?Tap Block with exparel  ? APPENDECTOMY  1982  ? BACK SURGERY    ? CARDIAC CATHETERIZATION  2003  ? CARDIAC CATHETERIZATION N/A 08/23/2016  ? Procedure: Left Heart Cath and Coronary Angiography;  Surgeon: Brenda Dials, MD;  Location: [facility];  Service: Cardiovascular;  Laterality: N/A;  ? CARPAL TUNNEL RELEASE Right   ? CATARACT EXTRACTION W/ INTRAOCULAR LENS  IMPLANT, BILATERAL  2016-2018  ? CHOLECYSTECTOMY OPEN  1982  ? COLONOSCOPY    ? CYSTO/ HOD/ BLADDER BX  2006  ? CYSTO/ HOD/ BLADDER BX/ FULGERATION  06-12-2011  ? EYE SURGERY    ? FINGER SURGERY Right 2001  ? "replaced worn cartilage on thumb w/tendons"  ? HYSTEROSCOPY WITH D & C  2005  ? INTERSTIM IMPLANT PLACEMENT  12/07/2011  ? Procedure: INTERSTIM IMPLANT FIRST STAGE;  Surgeon: Brenda Packer, MD;  Location: [facility];  Service: Urology;  Laterality: Right;  ? INTERSTIM IMPLANT PLACEMENT  12/14/2011  ? Procedure: INTERSTIM IMPLANT SECOND STAGE;  Surgeon: Brenda Packer, MD;  Location: [facility];  Service: Urology;  Laterality: Right;  rad tech ok by vickie at [facility]  ? JOINT REPLACEMENT    ? LUMBAR LAMINECTOMY  2007  ? L3 - 5  ? [place]  ? TOTAL KNEE ARTHROPLASTY Right 02/10/2020  ? Procedure: TOTAL KNEE ARTHROPLASTY;  Surgeon: Brenda Cancel, MD;  Location: [facility];  Service: Orthopedics;  Laterality: Right;  70 mins  ? TOTAL KNEE ARTHROPLASTY Left 07/20/2020  ? Procedure: TOTAL KNEE ARTHROPLASTY;  Surgeon: Brenda Cancel, MD;  Location: [facility];  Service: Orthopedics;  Laterality: Left;  ? TUBAL LIGATION    ? UPPER GI ENDOSCOPY    ? WRIST SURGERY Left   ? TFCC ("tendon repair")  ?: ? ? ?Current Outpatient Medications:  ?  acetaminophen (TYLENOL) 500 MG tablet, Take 1,000-1,500 mg by mouth daily as needed for headache (pain)., Disp: , Rfl:  ?  albuterol (VENTOLIN HFA) 108 (90 Base) MCG/ACT inhaler, Inhale 2 puffs into the lungs every 6  (six) hours as needed for wheezing or shortness of breath., Disp: 18 g, Rfl: 2 ?  alendronate (FOSAMAX) 70 MG tablet, Take 70 mg by mouth every Thursday. Take with a full glass of water on an empty stomach., Disp: , Rfl:  ?  aspirin EC 81 MG tablet, Take 81 mg by mouth 2 (two) times daily. Swallow whole., Disp: , Rfl:  ?  cholecalciferol (VITAMIN D) 25 MCG (1000 UNIT) tablet, Take 1,000 Units by mouth every morning., Disp: , Rfl:  ?  dextromethorphan-guaiFENesin (ROBITUSSIN-DM) 10-100 MG/5ML liquid, Take 10 mLs by mouth every 4 (four) hours as needed for cough., Disp: , Rfl:  ?  diltiazem (CARDIZEM  CD) 120 MG 24 hr capsule, TAKE 1 CAPSULE (120 MG TOTAL) BY MOUTH DAILY. PLEASE SCHEDULE APPT FOR FUTURE REFILLS. 1ST ATTEMPT (Patient taking differently: Take 120 mg by mouth every morning. Please schedule appt for future refills. 1st attempt), Disp: 90 capsule, Rfl: 3 ?  DULoxetine (CYMBALTA) 60 MG capsule, Take 60 mg by mouth at bedtime., Disp: , Rfl:  ?  hydrALAZINE (APRESOLINE) 25 MG tablet, Take 25 mg by mouth 2 (two) times daily., Disp: , Rfl:  ?  ipratropium-albuterol (DUONEB) 0.5-2.5 (3) MG/3ML SOLN, Take 3 mLs by nebulization every 6 (six) hours as needed., Disp: 360 mL, Rfl: 2 ?  Loperamide-Simethicone (ANTI-DIARRHEAL PLUS ANTI-GAS PO), Take 2 tablets by mouth daily as needed (gas and diarrhea)., Disp: , Rfl:  ?  mometasone-formoterol (DULERA) 100-5 MCG/ACT AERO, Inhale 2 puffs into the lungs 2 (two) times daily., Disp: 1 each, Rfl: 1 ?  nitroGLYCERIN (NITROSTAT) 0.4 MG SL tablet, Place 1 tablet (0.4 mg total) under the tongue every 5 (five) minutes x 3 doses as needed for chest pain., Disp: 10 tablet, Rfl: 0 ?  pantoprazole (PROTONIX) 40 MG tablet, Take 1 tablet (40 mg total) by mouth 2 (two) times daily. (Patient taking differently: Take 40 mg by mouth every morning.), Disp: 60 tablet, Rfl: 0 ?  Camphor-Eucalyptus-Menthol (VICKS VAPORUB EX), Place 1 application into the nose at bedtime as needed (congestion).  (Patient not taking: Reported on 05/01/2022), Disp: , Rfl:  ?  clonazePAM (KLONOPIN) 1 MG tablet, Take 1 tablet (1 mg total) by mouth at bedtime. (Patient not taking: Reported on 05/01/2022), Disp: 30 tablet, Rfl: 5: ? ?: ?

## 2022-05-01 ENCOUNTER — Other Ambulatory Visit (HOSPITAL_BASED_OUTPATIENT_CLINIC_OR_DEPARTMENT_OTHER): Payer: Self-pay

## 2022-05-01 ENCOUNTER — Other Ambulatory Visit: Payer: Self-pay

## 2022-05-01 ENCOUNTER — Telehealth: Payer: Self-pay

## 2022-05-01 ENCOUNTER — Inpatient Hospital Stay: Payer: Medicare Other

## 2022-05-01 ENCOUNTER — Inpatient Hospital Stay: Payer: Medicare Other | Attending: Nurse Practitioner | Admitting: Nurse Practitioner

## 2022-05-01 ENCOUNTER — Encounter: Payer: Self-pay | Admitting: Nurse Practitioner

## 2022-05-01 VITALS — BP 126/58 | HR 87 | Temp 98.1°F | Resp 18 | Ht <= 58 in | Wt 170.0 lb

## 2022-05-01 DIAGNOSIS — R109 Unspecified abdominal pain: Secondary | ICD-10-CM

## 2022-05-01 DIAGNOSIS — I2510 Atherosclerotic heart disease of native coronary artery without angina pectoris: Secondary | ICD-10-CM | POA: Diagnosis not present

## 2022-05-01 DIAGNOSIS — D72829 Elevated white blood cell count, unspecified: Secondary | ICD-10-CM

## 2022-05-01 DIAGNOSIS — Z79899 Other long term (current) drug therapy: Secondary | ICD-10-CM | POA: Diagnosis not present

## 2022-05-01 DIAGNOSIS — D649 Anemia, unspecified: Secondary | ICD-10-CM | POA: Diagnosis not present

## 2022-05-01 DIAGNOSIS — R160 Hepatomegaly, not elsewhere classified: Secondary | ICD-10-CM | POA: Diagnosis not present

## 2022-05-01 DIAGNOSIS — D729 Disorder of white blood cells, unspecified: Secondary | ICD-10-CM | POA: Diagnosis not present

## 2022-05-01 DIAGNOSIS — D471 Chronic myeloproliferative disease: Secondary | ICD-10-CM | POA: Diagnosis not present

## 2022-05-01 DIAGNOSIS — J449 Chronic obstructive pulmonary disease, unspecified: Secondary | ICD-10-CM | POA: Insufficient documentation

## 2022-05-01 DIAGNOSIS — D1803 Hemangioma of intra-abdominal structures: Secondary | ICD-10-CM | POA: Diagnosis not present

## 2022-05-01 DIAGNOSIS — Z87891 Personal history of nicotine dependence: Secondary | ICD-10-CM | POA: Insufficient documentation

## 2022-05-01 DIAGNOSIS — I10 Essential (primary) hypertension: Secondary | ICD-10-CM | POA: Insufficient documentation

## 2022-05-01 LAB — CBC WITH DIFFERENTIAL (CANCER CENTER ONLY)
Abs Immature Granulocytes: 0.14 10*3/uL — ABNORMAL HIGH (ref 0.00–0.07)
Basophils Absolute: 0.1 10*3/uL (ref 0.0–0.1)
Basophils Relative: 1 %
Eosinophils Absolute: 0.6 10*3/uL — ABNORMAL HIGH (ref 0.0–0.5)
Eosinophils Relative: 3 %
HCT: 38.1 % (ref 36.0–46.0)
Hemoglobin: 11.9 g/dL — ABNORMAL LOW (ref 12.0–15.0)
Immature Granulocytes: 1 %
Lymphocytes Relative: 10 %
Lymphs Abs: 2.3 10*3/uL (ref 0.7–4.0)
MCH: 27.8 pg (ref 26.0–34.0)
MCHC: 31.2 g/dL (ref 30.0–36.0)
MCV: 89 fL (ref 80.0–100.0)
Monocytes Absolute: 1.1 10*3/uL — ABNORMAL HIGH (ref 0.1–1.0)
Monocytes Relative: 5 %
Neutro Abs: 17.9 10*3/uL — ABNORMAL HIGH (ref 1.7–7.7)
Neutrophils Relative %: 80 %
Platelet Count: 373 10*3/uL (ref 150–400)
RBC: 4.28 MIL/uL (ref 3.87–5.11)
RDW: 13.3 % (ref 11.5–15.5)
WBC Count: 22.1 10*3/uL — ABNORMAL HIGH (ref 4.0–10.5)
nRBC: 0 % (ref 0.0–0.2)

## 2022-05-01 LAB — CMP (CANCER CENTER ONLY)
ALT: 26 U/L (ref 0–44)
AST: 39 U/L (ref 15–41)
Albumin: 3.8 g/dL (ref 3.5–5.0)
Alkaline Phosphatase: 112 U/L (ref 38–126)
Anion gap: 13 (ref 5–15)
BUN: 14 mg/dL (ref 8–23)
CO2: 29 mmol/L (ref 22–32)
Calcium: 9.6 mg/dL (ref 8.9–10.3)
Chloride: 98 mmol/L (ref 98–111)
Creatinine: 0.94 mg/dL (ref 0.44–1.00)
GFR, Estimated: >60 mL/min (ref >60)
Glucose, Bld: 175 mg/dL — ABNORMAL HIGH (ref 70–99)
Potassium: 3.5 mmol/L (ref 3.5–5.1)
Sodium: 140 mmol/L (ref 135–145)
Total Bilirubin: 0.5 mg/dL (ref 0.3–1.2)
Total Protein: 7.2 g/dL (ref 6.5–8.1)

## 2022-05-01 LAB — LACTATE DEHYDROGENASE: LDH: 135 U/L (ref 98–192)

## 2022-05-01 LAB — SAVE SMEAR(SSMR), FOR PROVIDER SLIDE REVIEW

## 2022-05-01 LAB — VITAMIN B12: Vitamin B-12: 343 pg/mL (ref 180–914)

## 2022-05-01 NOTE — Telephone Encounter (Signed)
Patient is schedule for Ct scan at [facility] on 05/03/22. Patient gave verbal understanding and had no further questions  or concerns. ?

## 2022-05-02 ENCOUNTER — Other Ambulatory Visit: Payer: Self-pay | Admitting: Nurse Practitioner

## 2022-05-03 ENCOUNTER — Ambulatory Visit (HOSPITAL_COMMUNITY)
Admission: RE | Admit: 2022-05-03 | Discharge: 2022-05-03 | Disposition: A | Discharge status: Home / Self Care | Payer: Medicare Other | Source: Ambulatory Visit | Attending: Nurse Practitioner | Admitting: Nurse Practitioner

## 2022-05-03 DIAGNOSIS — K760 Fatty (change of) liver, not elsewhere classified: Secondary | ICD-10-CM | POA: Diagnosis not present

## 2022-05-03 DIAGNOSIS — R109 Unspecified abdominal pain: Secondary | ICD-10-CM | POA: Diagnosis not present

## 2022-05-03 DIAGNOSIS — D72829 Elevated white blood cell count, unspecified: Secondary | ICD-10-CM | POA: Diagnosis not present

## 2022-05-03 MED ORDER — SODIUM CHLORIDE (PF) 0.9 % IJ SOLN
INTRAMUSCULAR | Status: AC
  Filled 2022-05-03: qty 50

## 2022-05-03 MED ORDER — IOHEXOL 300 MG/ML  SOLN
100.0000 mL | Freq: Once | INTRAMUSCULAR | Status: AC | PRN
  Administered 2022-05-03: 100 mL via INTRAVENOUS

## 2022-05-08 LAB — JAK2 (INCLUDING V617F AND EXON 12), MPL,& CALR-NEXT GEN SEQ

## 2022-05-17 ENCOUNTER — Inpatient Hospital Stay: Payer: Medicare Other

## 2022-05-17 ENCOUNTER — Inpatient Hospital Stay (HOSPITAL_BASED_OUTPATIENT_CLINIC_OR_DEPARTMENT_OTHER): Payer: Medicare Other | Admitting: Nurse Practitioner

## 2022-05-17 ENCOUNTER — Encounter: Payer: Self-pay | Admitting: Nurse Practitioner

## 2022-05-17 VITALS — BP 140/55 | HR 64 | Temp 98.2°F | Resp 18 | Ht <= 58 in | Wt 170.1 lb

## 2022-05-17 DIAGNOSIS — Z1211 Encounter for screening for malignant neoplasm of colon: Secondary | ICD-10-CM | POA: Diagnosis not present

## 2022-05-17 DIAGNOSIS — I2510 Atherosclerotic heart disease of native coronary artery without angina pectoris: Secondary | ICD-10-CM | POA: Diagnosis not present

## 2022-05-17 DIAGNOSIS — Z87891 Personal history of nicotine dependence: Secondary | ICD-10-CM | POA: Diagnosis not present

## 2022-05-17 DIAGNOSIS — D72829 Elevated white blood cell count, unspecified: Secondary | ICD-10-CM

## 2022-05-17 DIAGNOSIS — I10 Essential (primary) hypertension: Secondary | ICD-10-CM | POA: Diagnosis not present

## 2022-05-17 DIAGNOSIS — D1803 Hemangioma of intra-abdominal structures: Secondary | ICD-10-CM | POA: Diagnosis not present

## 2022-05-17 DIAGNOSIS — J449 Chronic obstructive pulmonary disease, unspecified: Secondary | ICD-10-CM | POA: Diagnosis not present

## 2022-05-17 DIAGNOSIS — R109 Unspecified abdominal pain: Secondary | ICD-10-CM

## 2022-05-17 LAB — CBC WITH DIFFERENTIAL (CANCER CENTER ONLY)
Abs Immature Granulocytes: 0.12 10*3/uL — ABNORMAL HIGH (ref 0.00–0.07)
Basophils Absolute: 0.2 10*3/uL — ABNORMAL HIGH (ref 0.0–0.1)
Basophils Relative: 1 %
Eosinophils Absolute: 0.6 10*3/uL — ABNORMAL HIGH (ref 0.0–0.5)
Eosinophils Relative: 3 %
HCT: 40.4 % (ref 36.0–46.0)
Hemoglobin: 12.6 g/dL (ref 12.0–15.0)
Immature Granulocytes: 1 %
Lymphocytes Relative: 11 %
Lymphs Abs: 2.6 10*3/uL (ref 0.7–4.0)
MCH: 27.7 pg (ref 26.0–34.0)
MCHC: 31.2 g/dL (ref 30.0–36.0)
MCV: 88.8 fL (ref 80.0–100.0)
Monocytes Absolute: 1.3 10*3/uL — ABNORMAL HIGH (ref 0.1–1.0)
Monocytes Relative: 6 %
Neutro Abs: 18.7 10*3/uL — ABNORMAL HIGH (ref 1.7–7.7)
Neutrophils Relative %: 78 %
Platelet Count: 339 10*3/uL (ref 150–400)
RBC: 4.55 MIL/uL (ref 3.87–5.11)
RDW: 13.2 % (ref 11.5–15.5)
WBC Count: 23.5 10*3/uL — ABNORMAL HIGH (ref 4.0–10.5)
nRBC: 0 % (ref 0.0–0.2)

## 2022-05-17 NOTE — Progress Notes (Signed)
  [facility] OFFICE PROGRESS NOTE   Diagnosis: Leukocytosis  INTERVAL HISTORY:   [name] returns as scheduled.  No fever.  She continues to have periodic night sweats.  Appetite varies.  Weight is stable.  She had recent dental work.  Objective:  Vital signs in last 24 hours:  Blood pressure (!) 140/55, pulse 64, temperature 98.2 F (36.8 C), temperature source Oral, resp. rate 18, height $RemoveBe'4\' 10"'KmsNdltBq$  (1.473 m), weight 170 lb 1.6 oz (77.2 kg), last menstrual period 09/06/2005, SpO2 96 %.    HEENT: No thrush or ulcers. Resp: Lungs clear bilaterally. Cardio: Regular rate and rhythm. GI: No splenomegaly.  Liver palpable right upper abdomen. Vascular: No leg edema.   Lab Results:  Lab Results  Component Value Date   WBC 23.5 (H) 05/17/2022   HGB 12.6 05/17/2022   HCT 40.4 05/17/2022   MCV 88.8 05/17/2022   PLT 339 05/17/2022   NEUTROABS 18.7 (H) 05/17/2022    Imaging:  No results found.  Medications: I have reviewed the patient's current medications.  Assessment/Plan: Leukocytosis 05/27/2015-BCR/ABL not detected, negative for JAK2 V617F mutation 09/15/2015 bone marrow biopsy-normocellular bone marrow with trilineage hematopoiesis, no definite morphologic evidence of a myeloproliferative or myelodysplastic process, cytogenetic analysis unremarkable 05/01/2022 advanced NGS JAK2, MPL and CALR normal Hepatomegaly on exam 05/01/2022 05/03/2022 CT abdomen/pelvis-no acute findings.  Stable mild hepatomegaly and diffuse hepatic steatosis.  Stable small benign hemangioma right hepatic lobe COPD Hypertension CAD Quit smoking November 2022  Disposition: [name] appears unchanged.  She has persistent leukocytosis of unclear etiology.  We discussed that she may have an early myeloproliferative disorder despite the unremarkable extended myeloproliferative panel.  Plan for continued observation.  We discussed the recent CT scan.  She has stable mild hepatomegaly  compared to CT from 02/16/2020.  No splenomegaly.  She will return for a CBC and follow-up visit in 4 to 6 months.    Ned Card ANP/GNP-BC   05/17/2022  1:46 PM

## 2022-06-06 DIAGNOSIS — R072 Precordial pain: Secondary | ICD-10-CM | POA: Diagnosis not present

## 2022-06-06 DIAGNOSIS — R0602 Shortness of breath: Secondary | ICD-10-CM | POA: Diagnosis not present

## 2022-06-06 DIAGNOSIS — J4520 Mild intermittent asthma, uncomplicated: Secondary | ICD-10-CM | POA: Diagnosis not present

## 2022-06-06 DIAGNOSIS — I10 Essential (primary) hypertension: Secondary | ICD-10-CM | POA: Diagnosis not present

## 2022-06-06 DIAGNOSIS — M48061 Spinal stenosis, lumbar region without neurogenic claudication: Secondary | ICD-10-CM | POA: Diagnosis not present

## 2022-07-05 DIAGNOSIS — Z1231 Encounter for screening mammogram for malignant neoplasm of breast: Secondary | ICD-10-CM | POA: Diagnosis not present

## 2022-08-01 ENCOUNTER — Other Ambulatory Visit: Payer: Self-pay | Admitting: *Deleted

## 2022-08-01 NOTE — Patient Outreach (Signed)
  Care Coordination   08/01/2022 Name: [name] MRN: [id number] DOB: 1956/11/11   Care Coordination Outreach Attempts:  An unsuccessful telephone outreach was attempted today to offer the patient information about available care coordination services as a benefit of their health plan.   Follow Up Plan:  Additional outreach attempts will be made to offer the patient care coordination information and services.   Encounter Outcome:  No Answer  Care Coordination Interventions Activated:  No   Care Coordination Interventions:  No, not indicated    Raina Mina, RN Care Management Coordinator [place] Office [phone]

## 2022-08-10 ENCOUNTER — Telehealth: Payer: Self-pay

## 2022-08-10 ENCOUNTER — Other Ambulatory Visit: Payer: Self-pay | Admitting: *Deleted

## 2022-08-10 ENCOUNTER — Encounter: Payer: Self-pay | Admitting: *Deleted

## 2022-08-10 DIAGNOSIS — J441 Chronic obstructive pulmonary disease with (acute) exacerbation: Secondary | ICD-10-CM

## 2022-08-10 NOTE — Patient Instructions (Signed)
Visit Information  Thank you for taking time to visit with me today. Please don't hesitate to contact me if I can be of assistance to you.   Following are the goals we discussed today:   Goals Addressed               This Visit's Progress     "Need some where to stay" (pt-stated)        Care Coordination Interventions: Reviewed medications with patient and discussed purpose of all medications Reviewed scheduled/upcoming provider appointments including pending appointments and encouraged pt to contact her provider and scheduled an AWV for this year (receptive). Care Guide referral for Housing resources and pt declined social work referral. Screening for signs and symptoms of depression related to chronic disease state  Assessed social determinant of health barriers [place] her home and pt currently receiving assistance from the [employer] and staying at the [facility]. Pt also receiving assistance from her sister Daine Floras).         Our next appointment is by telephone on 08/24/2022 at 11:00 AM  Please call the care guide team at [phone] if you need to cancel or reschedule your appointment.   If you are experiencing a Mental Health or [place] or need someone to talk to, please call the Suicide and Crisis Lifeline: 988  The patient verbalized understanding of instructions, educational materials, and care plan provided today and agreed to receive a mailed copy of patient instructions, educational materials, and care plan.   The patient has been provided with contact information for the care management team and has been advised to call with any health related questions or concerns.  Next PCP appointment scheduled for: 08/24/2022 @ 11:00 AM

## 2022-08-10 NOTE — Patient Outreach (Signed)
  Care Coordination   Initial Visit Note   08/10/2022 Name: [name] MRN: [id number] DOB: [birth year]  [name] is a 66 y.o. year old female who sees Saintclair Halsted, FNP for primary care. I spoke with  Wilhelmenia Blase by phone today  What matters to the patients health and wellness today?  Housing    Goals Addressed               This Visit's Progress     "Need some where to stay" (pt-stated)        Care Coordination Interventions: Reviewed medications with patient and discussed purpose of all medications Reviewed scheduled/upcoming provider appointments including pending appointments and encouraged pt to contact her provider and scheduled an AWV for this year (receptive). Care Guide referral for Housing resources and pt declined social work referral. Screening for signs and symptoms of depression related to chronic disease state  Assessed social determinant of health barriers [place] her home and pt currently receiving assistance from the [employer] and staying at the [facility]. Pt also receiving assistance from her sister [name]).         SDOH assessments and interventions completed:  Yes  SDOH Interventions Today    Flowsheet Row Most Recent Value  SDOH Interventions   Food Insecurity Interventions Intervention Not Indicated  Housing Interventions Other (Comment)  [Careguide referral]  Transportation Interventions Intervention Not Indicated        Care Coordination Interventions Activated:  Yes  Care Coordination Interventions:  Yes, provided   Follow up plan: Follow up call scheduled for 08/24/2022 '@11'$ :00 AM    Encounter Outcome:  Pt. Visit Completed   Raina Mina, RN Care Management Coordinator [place] Office [phone]

## 2022-08-10 NOTE — Telephone Encounter (Signed)
   Telephone encounter was:  Unsuccessful.  08/10/2022 Name: [name] MRN: [id number] DOB: 20-Mar-1956  Unsuccessful outbound call made today to assist with:   housing  Outreach Attempt:  1st Attempt  A HIPAA compliant voice message was left requesting a return call.  Instructed patient to call back at  earliest convenience. [place], Care Management  [phone] [address], [place], [place] [address] Phone: [phone] Email: [name]

## 2022-08-15 ENCOUNTER — Telehealth: Payer: Self-pay

## 2022-08-15 NOTE — Telephone Encounter (Signed)
   Telephone encounter was:  Successful.  08/15/2022 Name: [name] MRN: [id number] DOB: July 25, 1956  [name] is a 66 y.o. year old female who is a primary care patient of Saintclair Halsted, FNP . The community resource team was consulted for assistance with  [place] guide performed the following interventions: Patient provided with information about care guide support team and interviewed to confirm resource needs.Patient is homeless and need housing, I have mailed resources and put in a referral for [facility]  Follow Up Plan:  No further follow up planned at this time. The patient has been provided with needed resources.    [place], Care Management  [phone] [address], [place],  [address] Phone: [phone] Email: [email or web address]

## 2022-08-24 ENCOUNTER — Ambulatory Visit: Payer: Self-pay | Admitting: *Deleted

## 2022-08-24 NOTE — Patient Outreach (Signed)
  Care Coordination   08/24/2022 Name: [name] MRN: [id number] DOB: 09-16-1956   Care Coordination Outreach Attempts:  An unsuccessful telephone outreach was attempted for a scheduled appointment today.  Follow Up Plan:  Additional outreach attempts will be made to offer the patient care coordination information and services.   Encounter Outcome:  No Answer  Care Coordination Interventions Activated:  No   Care Coordination Interventions:  No, not indicated    Raina Mina, RN Care Management Coordinator [place] Office [phone]

## 2022-09-08 DIAGNOSIS — J449 Chronic obstructive pulmonary disease, unspecified: Secondary | ICD-10-CM | POA: Diagnosis not present

## 2022-09-08 DIAGNOSIS — I10 Essential (primary) hypertension: Secondary | ICD-10-CM | POA: Diagnosis not present

## 2022-09-08 DIAGNOSIS — F331 Major depressive disorder, recurrent, moderate: Secondary | ICD-10-CM | POA: Diagnosis not present

## 2022-09-08 DIAGNOSIS — M1990 Unspecified osteoarthritis, unspecified site: Secondary | ICD-10-CM | POA: Diagnosis not present

## 2022-09-08 DIAGNOSIS — E782 Mixed hyperlipidemia: Secondary | ICD-10-CM | POA: Diagnosis not present

## 2022-09-08 DIAGNOSIS — M810 Age-related osteoporosis without current pathological fracture: Secondary | ICD-10-CM | POA: Diagnosis not present

## 2022-09-11 ENCOUNTER — Ambulatory Visit: Payer: Self-pay | Admitting: *Deleted

## 2022-09-11 NOTE — Patient Outreach (Signed)
  Care Coordination   09/11/2022 Name: [name] MRN: [id number] DOB: 20-Jan-1956   Care Coordination Outreach Attempts:  An unsuccessful telephone outreach was attempted for a scheduled appointment today.  Follow Up Plan:  Additional outreach attempts will be made to offer the patient care coordination information and services.   Encounter Outcome:  No Answer  Care Coordination Interventions Activated:  No   Care Coordination Interventions:  No, not indicated    Raina Mina, RN Care Management Coordinator [place] Office [phone]

## 2022-09-12 DIAGNOSIS — I340 Nonrheumatic mitral (valve) insufficiency: Secondary | ICD-10-CM | POA: Diagnosis not present

## 2022-09-12 DIAGNOSIS — I361 Nonrheumatic tricuspid (valve) insufficiency: Secondary | ICD-10-CM | POA: Diagnosis not present

## 2022-09-12 DIAGNOSIS — R072 Precordial pain: Secondary | ICD-10-CM | POA: Diagnosis not present

## 2022-09-18 ENCOUNTER — Inpatient Hospital Stay: Payer: Medicare Other | Attending: Oncology

## 2022-09-18 ENCOUNTER — Inpatient Hospital Stay (HOSPITAL_BASED_OUTPATIENT_CLINIC_OR_DEPARTMENT_OTHER): Payer: Medicare Other | Admitting: Oncology

## 2022-09-18 ENCOUNTER — Ambulatory Visit: Payer: Self-pay | Admitting: *Deleted

## 2022-09-18 VITALS — BP 112/58 | HR 85 | Temp 98.2°F | Resp 18 | Ht <= 58 in | Wt 156.0 lb

## 2022-09-18 DIAGNOSIS — D72829 Elevated white blood cell count, unspecified: Secondary | ICD-10-CM

## 2022-09-18 LAB — CBC WITH DIFFERENTIAL (CANCER CENTER ONLY)
Abs Immature Granulocytes: 0.11 10*3/uL — ABNORMAL HIGH (ref 0.00–0.07)
Basophils Absolute: 0.1 10*3/uL (ref 0.0–0.1)
Basophils Relative: 1 %
Eosinophils Absolute: 0.6 10*3/uL — ABNORMAL HIGH (ref 0.0–0.5)
Eosinophils Relative: 3 %
HCT: 38.7 % (ref 36.0–46.0)
Hemoglobin: 12.3 g/dL (ref 12.0–15.0)
Immature Granulocytes: 1 %
Lymphocytes Relative: 11 %
Lymphs Abs: 2.4 10*3/uL (ref 0.7–4.0)
MCH: 27.1 pg (ref 26.0–34.0)
MCHC: 31.8 g/dL (ref 30.0–36.0)
MCV: 85.2 fL (ref 80.0–100.0)
Monocytes Absolute: 1.1 10*3/uL — ABNORMAL HIGH (ref 0.1–1.0)
Monocytes Relative: 5 %
Neutro Abs: 17.5 10*3/uL — ABNORMAL HIGH (ref 1.7–7.7)
Neutrophils Relative %: 79 %
Platelet Count: 335 10*3/uL (ref 150–400)
RBC: 4.54 MIL/uL (ref 3.87–5.11)
RDW: 16.4 % — ABNORMAL HIGH (ref 11.5–15.5)
WBC Count: 21.7 10*3/uL — ABNORMAL HIGH (ref 4.0–10.5)
nRBC: 0 % (ref 0.0–0.2)

## 2022-09-18 NOTE — Patient Outreach (Signed)
  Care Coordination   Follow Up Visit Note   09/18/2022 Name: SHARONA ROVNER MRN: [id number] DOB: Jun 02, 1956  LATASH NOURI is a 66 y.o. year old female who sees Saintclair Halsted, FNP for primary care. I spoke with  Wilhelmenia Blase by phone today.  What matters to the patients health and wellness today?  No further needs    Goals Addressed               This Visit's Progress     COMPLETED: "Need some where to stay" (pt-stated)        Care Coordination Interventions:  Addendum 10/2-Pt received housing information and currently living in a temporary trailer of a friend but will review the information for a long term housing. Pt states she will follow up with her PCP on 09/27/2022 for her AWV. No additional needs at this time.         SDOH assessments and interventions completed:  No     Care Coordination Interventions Activated:  Yes  Care Coordination Interventions:  Yes, provided   Follow up plan: No further intervention required.   Encounter Outcome:  Pt. Visit Completed   Raina Mina, RN Care Management Coordinator [place] Office [phone]
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
## 2022-09-27 DIAGNOSIS — I10 Essential (primary) hypertension: Secondary | ICD-10-CM | POA: Diagnosis not present

## 2022-09-27 DIAGNOSIS — D72829 Elevated white blood cell count, unspecified: Secondary | ICD-10-CM | POA: Diagnosis not present

## 2022-09-27 DIAGNOSIS — R252 Cramp and spasm: Secondary | ICD-10-CM | POA: Diagnosis not present

## 2022-09-27 DIAGNOSIS — K429 Umbilical hernia without obstruction or gangrene: Secondary | ICD-10-CM | POA: Diagnosis not present

## 2022-09-27 DIAGNOSIS — F331 Major depressive disorder, recurrent, moderate: Secondary | ICD-10-CM | POA: Diagnosis not present

## 2022-09-27 DIAGNOSIS — E782 Mixed hyperlipidemia: Secondary | ICD-10-CM | POA: Diagnosis not present

## 2022-09-27 DIAGNOSIS — Z23 Encounter for immunization: Secondary | ICD-10-CM | POA: Diagnosis not present

## 2022-09-27 DIAGNOSIS — G4700 Insomnia, unspecified: Secondary | ICD-10-CM | POA: Diagnosis not present

## 2022-10-25 DIAGNOSIS — J449 Chronic obstructive pulmonary disease, unspecified: Secondary | ICD-10-CM | POA: Diagnosis not present

## 2022-10-25 DIAGNOSIS — S0101XA Laceration without foreign body of scalp, initial encounter: Secondary | ICD-10-CM | POA: Diagnosis not present

## 2022-10-25 DIAGNOSIS — F419 Anxiety disorder, unspecified: Secondary | ICD-10-CM | POA: Diagnosis not present

## 2022-10-28 DIAGNOSIS — I10 Essential (primary) hypertension: Secondary | ICD-10-CM | POA: Diagnosis not present

## 2022-10-28 DIAGNOSIS — J449 Chronic obstructive pulmonary disease, unspecified: Secondary | ICD-10-CM | POA: Diagnosis not present

## 2022-10-28 DIAGNOSIS — S81811A Laceration without foreign body, right lower leg, initial encounter: Secondary | ICD-10-CM | POA: Diagnosis not present

## 2022-10-28 DIAGNOSIS — F419 Anxiety disorder, unspecified: Secondary | ICD-10-CM | POA: Diagnosis not present

## 2022-10-30 DIAGNOSIS — F419 Anxiety disorder, unspecified: Secondary | ICD-10-CM | POA: Diagnosis not present

## 2022-10-30 DIAGNOSIS — J449 Chronic obstructive pulmonary disease, unspecified: Secondary | ICD-10-CM | POA: Diagnosis not present

## 2022-10-30 DIAGNOSIS — I10 Essential (primary) hypertension: Secondary | ICD-10-CM | POA: Diagnosis not present

## 2022-11-19 DIAGNOSIS — F419 Anxiety disorder, unspecified: Secondary | ICD-10-CM | POA: Diagnosis not present

## 2022-11-19 DIAGNOSIS — T798XXA Other early complications of trauma, initial encounter: Secondary | ICD-10-CM | POA: Diagnosis not present

## 2022-11-19 DIAGNOSIS — J449 Chronic obstructive pulmonary disease, unspecified: Secondary | ICD-10-CM | POA: Diagnosis not present

## 2022-11-19 DIAGNOSIS — I10 Essential (primary) hypertension: Secondary | ICD-10-CM | POA: Diagnosis not present

## 2022-11-20 DIAGNOSIS — I10 Essential (primary) hypertension: Secondary | ICD-10-CM | POA: Diagnosis not present

## 2022-11-20 DIAGNOSIS — J449 Chronic obstructive pulmonary disease, unspecified: Secondary | ICD-10-CM | POA: Diagnosis not present

## 2022-11-20 DIAGNOSIS — M810 Age-related osteoporosis without current pathological fracture: Secondary | ICD-10-CM | POA: Diagnosis not present

## 2022-11-20 DIAGNOSIS — F331 Major depressive disorder, recurrent, moderate: Secondary | ICD-10-CM | POA: Diagnosis not present

## 2022-11-20 DIAGNOSIS — E782 Mixed hyperlipidemia: Secondary | ICD-10-CM | POA: Diagnosis not present

## 2022-11-29 DIAGNOSIS — I10 Essential (primary) hypertension: Secondary | ICD-10-CM | POA: Diagnosis not present

## 2022-11-29 DIAGNOSIS — R072 Precordial pain: Secondary | ICD-10-CM | POA: Diagnosis not present

## 2022-11-29 DIAGNOSIS — M48061 Spinal stenosis, lumbar region without neurogenic claudication: Secondary | ICD-10-CM | POA: Diagnosis not present

## 2022-11-29 DIAGNOSIS — J4520 Mild intermittent asthma, uncomplicated: Secondary | ICD-10-CM | POA: Diagnosis not present

## 2022-12-10 ENCOUNTER — Ambulatory Visit
Admission: EM | Admit: 2022-12-10 | Discharge: 2022-12-10 | Disposition: A | Discharge status: Home / Self Care | Payer: Medicare Other | Attending: Physician Assistant | Admitting: Physician Assistant

## 2022-12-10 DIAGNOSIS — B349 Viral infection, unspecified: Secondary | ICD-10-CM | POA: Diagnosis not present

## 2022-12-10 DIAGNOSIS — R509 Fever, unspecified: Secondary | ICD-10-CM | POA: Diagnosis not present

## 2022-12-10 DIAGNOSIS — D72829 Elevated white blood cell count, unspecified: Secondary | ICD-10-CM | POA: Diagnosis not present

## 2022-12-10 DIAGNOSIS — R6883 Chills (without fever): Secondary | ICD-10-CM | POA: Diagnosis not present

## 2022-12-10 LAB — POCT URINALYSIS DIP (MANUAL ENTRY)
Bilirubin, UA: NEGATIVE
Blood, UA: NEGATIVE
Glucose, UA: NEGATIVE mg/dL
Ketones, POC UA: NEGATIVE mg/dL
Leukocytes, UA: NEGATIVE
Nitrite, UA: NEGATIVE
Protein Ur, POC: NEGATIVE mg/dL
Spec Grav, UA: 1.02 (ref 1.010–1.025)
Urobilinogen, UA: 0.2 E.U./dL
pH, UA: 5.5 (ref 5.0–8.0)

## 2022-12-10 NOTE — ED Provider Notes (Signed)
[facility]    CSN: [id number] Arrival date & time: 12/10/22  1234      History   Chief Complaint Chief Complaint  Patient presents with   Chills    HPI [name] is a 66 y.o. female.   Patient presents today with a 5-day history of fever.  Reports her last fever was approximately 48 hours ago and was 101.8 F.  Reports associated chills, night sweats, malaise.  She does have some congestion and cough but states this is related to ongoing sinus issues.  She denies any abdominal pain, diarrhea, nausea, vomiting.  She does have some urinary frequency and urgency but states this is related to chronic overactive bladder and unchanged from baseline.  She denies any recent antibiotics or steroids.  She is not taking any over-the-counter medications for symptom management.  She denies any associated weight loss.    Past Medical History:  Diagnosis Date   Acute coronary syndrome (HCC)    Acute exacerbation of chronic obstructive airways disease (HCC)    Acute renal failure syndrome (HCC)    Agoraphobia without history of panic disorder    Anginal pain ([place])    Anxiety    Arthritis    "qwhere" (04/03/2018)   Bilateral carpal tunnel syndrome 02/18/2016   Bilateral knee pain    Carpal tunnel syndrome of left wrist    Chest pain    Childhood asthma    Chronic bronchitis (HCC)    Chronic lower back pain    Colitis presumed infectious    COPD (chronic obstructive pulmonary disease) ([place])    Coronary artery disease CARDIOLOGIST- DR  Doylene Canard  (VISIT 03-30-11 W/ CHART)   NON-OBS. CAD   (STRESS TEST NOV. 2011   Depression    DJD (degenerative joint disease)    JOINT PAIN   Dyspnea    "anytime but mostly on exertion and smoking"   Dysrhythmia 2017   SVT   Elevated liver enzymes    Fibromyalgia    Frequency of urination    GERD (gastroesophageal reflux disease)     W/ NEXIUM   Heart murmur    Hemorrhoids    High cholesterol    History of blood transfusion     "related to anemia" (04/03/2018)   History of carpal tunnel syndrome    Right   History of gastric ulcer 2004   Hyperlipidemia    Hypertension    IBS (irritable bowel syndrome)    Incisional pain s/p interstim implant 1st stage--- 12-07-11    left upper buttock-- pt states dressing clean dry and intact (on 12-08-11)   Influenza due to influenza A virus    Insomnia    Internal derangement of left knee    Iron deficiency anemia 2004   Lesion of liver    Leukocytosis    Lumbar pain    Nocturia    Non-productive cough    Numbness in both hands AT TIMES   OSA (obstructive sleep apnea)    "suppose to wear mask; I don't" (04/03/2018)   Osteoarthritis of left knee    PONV (postoperative nausea and vomiting)    Post laminectomy syndrome    Preinfarction syndrome (HCC)    Supraventricular tachycardia    SVT (supraventricular tachycardia)    Tobacco user    Urge urinary incontinence     Patient Active Problem List   Diagnosis Date Noted   Acute respiratory failure with hypoxia ([place]) 11/14/2021   Elevated LFTs    Influenza  A 11/13/2021   S/P lumbar fusion 01/26/2021   Encounter for orthopedic follow-up care 10/25/2020   Pain in left knee 08/17/2020   Colitis presumed infectious 02/17/2020   Lesion of liver 02/17/2020   History of total knee arthroplasty 02/10/2020   Osteoarthritis of right knee 10/30/2019   Osteoarthritis of left knee 10/30/2019   Internal derangement of left knee 07/07/2019   Bilateral knee pain 03/21/2019   Low back pain 16/09/9603   Complication of surgical procedure 06/17/2018   Degeneration of lumbar intervertebral disc 06/17/2018   Degenerative spondylolisthesis 06/17/2018   Acute coronary syndrome ([place]) 04/02/2018   Hyperlipidemia 08/06/2017   Influenza due to influenza A virus 01/01/2017   COPD with acute exacerbation ([place]) 01/01/2017   Acute renal failure syndrome ([place]) 01/01/2017   Dehydration 01/01/2017   Preinfarction syndrome ([place]) 10/14/2016    Chest pain 08/22/2016   Supraventricular tachycardia 08/22/2016   Obstructive sleep apnea syndrome    Hypertensive disorder    Gastroesophageal reflux disease    Depressive disorder    Coronary arteriosclerosis    Anxiety    Pain in the chest    Hypokalemia    Carpal tunnel syndrome of left wrist 02/18/2016   Leukocytosis 05/26/2014   Tobacco user 05/26/2014   Urge incontinence of urine 12/14/2011    Past Surgical History:  Procedure Laterality Date   ABDOMINAL EXPOSURE N/A 01/26/2021   Procedure: ABDOMINAL EXPOSURE;  Surgeon: Rosetta Posner, MD;  Location: [facility];  Service: Vascular;  Laterality: N/A;   ANTERIOR CERVICAL DECOMP/DISCECTOMY FUSION  2008   C4 - T1 ("screws from 1st OR came out")   ANTERIOR CERVICAL DECOMP/DISCECTOMY FUSION  2002   C4 - 7   ANTERIOR LATERAL LUMBAR FUSION WITH PERCUTANEOUS SCREW 2 LEVEL Left 11/27/2018   Procedure: XLIF Lumbar 3-5, posterior spinal fusion L3-5;  Surgeon: Melina Schools, MD;  Location: [facility];  Service: Orthopedics;  Laterality: Left;  5.5 hrs for entire procedure   ANTERIOR LUMBAR FUSION N/A 01/26/2021   Procedure: ANTERIOR LUMBAR FUSION LUMBAR FIVE-SACRAL ONE WITH POSTERIOR SPINAL FUSION INTERBODY (EXTENSION OF PREVIOUS FUSION);  Surgeon: Melina Schools, MD;  Location: [facility];  Service: Orthopedics;  Laterality: N/A;  6hrs Dr. Donnetta Hutching to do approach Tap Block with exparel   APPENDECTOMY  1982   BACK SURGERY     CARDIAC CATHETERIZATION  2003   CARDIAC CATHETERIZATION N/A 08/23/2016   Procedure: Left Heart Cath and Coronary Angiography;  Surgeon: Dixie Dials, MD;  Location: [facility];  Service: Cardiovascular;  Laterality: N/A;   CARPAL TUNNEL RELEASE Right    CATARACT EXTRACTION W/ INTRAOCULAR LENS  IMPLANT, BILATERAL  2016-2018   CHOLECYSTECTOMY OPEN  1982   COLONOSCOPY     CYSTO/ HOD/ BLADDER BX  2006   CYSTO/ HOD/ BLADDER BX/ FULGERATION  06-12-2011   EYE SURGERY     FINGER SURGERY Right 2001   "replaced worn cartilage on  thumb w/tendons"   HYSTEROSCOPY WITH D & C  2005   INTERSTIM IMPLANT PLACEMENT  12/07/2011   Procedure: Barrie Lyme IMPLANT FIRST STAGE;  Surgeon: [name], MD;  Location[facility];  Service: Urology;  Laterality: Right;   INTERSTIM IMPLANT PLACEMENT  12/14/2011   Procedure: Barrie Lyme IMPLANT SECOND STAGE;  Surgeon: [name], MD;  Location[facility];  Service: Urology;  Laterality: Right;  rad tech ok by vickie at main   [place]  2007   L3 -  [place]   TOTAL KNEE ARTHROPLASTY Right 02/10/2020   Procedure: TOTAL KNEE ARTHROPLASTY;  Surgeon: Paralee Cancel, MD;  Location: [facility];  Service: Orthopedics;  Laterality: Right;  70 mins   TOTAL KNEE ARTHROPLASTY Left 07/20/2020   Procedure: TOTAL KNEE ARTHROPLASTY;  Surgeon: Paralee Cancel, MD;  Location: [facility];  Service: Orthopedics;  Laterality: Left;   TUBAL LIGATION     UPPER GI ENDOSCOPY     WRIST SURGERY Left    TFCC ("tendon repair")    OB History   No obstetric history on file.      Home Medications    Prior to Admission medications   Medication Sig Start Date End Date Taking? Authorizing Provider  acetaminophen (TYLENOL) 500 MG tablet Take 1,000-1,500 mg by mouth daily as needed for headache (pain).    [provider]  albuterol (VENTOLIN HFA) 108 (90 Base) MCG/ACT inhaler Inhale 2 puffs into the lungs every 6 (six) hours as needed for wheezing or shortness of breath. 11/18/21   Hosie Poisson, MD  alendronate (FOSAMAX) 70 MG tablet Take 70 mg by mouth every Thursday. Take with a full glass of water on an empty stomach.    [provider]  aspirin EC 81 MG tablet Take 81 mg by mouth 2 (two) times daily. Swallow whole.    [provider]  diltiazem (CARDIZEM CD) 120 MG 24 hr capsule TAKE 1 CAPSULE (120 MG TOTAL) BY MOUTH DAILY. PLEASE SCHEDULE APPT FOR FUTURE REFILLS. 1ST ATTEMPT Patient  taking differently: Take 120 mg by mouth every morning. Please schedule appt for future refills. 1st attempt 05/31/21   Constance Haw, MD  DULoxetine (CYMBALTA) 60 MG capsule Take 60 mg by mouth at bedtime.    [provider]  hydrALAZINE (APRESOLINE) 25 MG tablet Take 25 mg by mouth 2 (two) times daily.    [provider]  ipratropium-albuterol (DUONEB) 0.5-2.5 (3) MG/3ML SOLN Take 3 mLs by nebulization every 6 (six) hours as needed. Patient not taking: Reported on 05/17/2022 11/18/21   Hosie Poisson, MD  KLOR-CON M10 10 MEQ tablet Take 10 mEq by mouth daily. 09/01/22   [provider]  Loperamide-Simethicone (ANTI-DIARRHEAL PLUS ANTI-GAS PO) Take 2 tablets by mouth daily as needed (gas and diarrhea).    [provider]  nitroGLYCERIN (NITROSTAT) 0.4 MG SL tablet Place 1 tablet (0.4 mg total) under the tongue every 5 (five) minutes x 3 doses as needed for chest pain. Patient not taking: Reported on 09/18/2022 08/24/16   Eugenie Filler, MD  pantoprazole (PROTONIX) 40 MG tablet Take 1 tablet (40 mg total) by mouth 2 (two) times daily. 02/20/20   Harold Hedge, MD    Family History Family History  Problem Relation Age of Onset   Hypertension Father    Heart disease Father    Kidney failure Father    Breast cancer Mother    Hypertension Mother    Coronary artery disease Mother    Alzheimer's disease Mother    Hypertension Brother    Hypertension Sister    Hypertension Daughter     Social History Social History   Tobacco Use   Smoking status: Every Day    Packs/day: 2.00    Years: 43.00    Total pack years: 86.00    Types: Cigarettes   Smokeless tobacco: Never  Vaping Use   Vaping Use: Never used  Substance Use Topics   Alcohol use: Not Currently  Alcohol/week: 0.0 standard drinks of alcohol    Comment: 04/03/2018 "nothing in years"   Drug use: Not Currently    Types: Marijuana     Allergies   Midazolam hcl, Prednisone, Statins, Ace  inhibitors, Amoxicillin, Nsaids, and Sulfa antibiotics   Review of Systems Review of Systems  Constitutional:  Positive for activity change, chills and fever. Negative for appetite change and fatigue.  HENT:  Positive for congestion and sinus pressure. Negative for sneezing and sore throat.   Respiratory:  Positive for cough. Negative for shortness of breath.   Cardiovascular:  Negative for chest pain.  Gastrointestinal:  Negative for abdominal pain, diarrhea, nausea and vomiting.  Genitourinary:  Positive for frequency and urgency. Negative for dysuria.     Physical Exam Triage Vital Signs ED Triage Vitals  Enc Vitals Group     BP 12/10/22 1435 125/61     Pulse Rate 12/10/22 1434 61     Resp 12/10/22 1434 17     Temp 12/10/22 1434 98.1 F (36.7 C)     Temp Source 12/10/22 1434 Oral     SpO2 12/10/22 1434 94 %     Weight --      Height --      Head Circumference --      Peak Flow --      Pain Score 12/10/22 1438 2     Pain Loc --      Pain Edu? --      Excl. in [place]? --    No data found.  Updated Vital Signs BP 125/61   Pulse 61   Temp 98.1 F (36.7 C) (Oral)   Resp 17   LMP 09/06/2005 Comment: tubal ligation  SpO2 94%   Visual Acuity Right Eye Distance:   Left Eye Distance:   Bilateral Distance:    Right Eye Near:   Left Eye Near:    Bilateral Near:     Physical Exam Vitals reviewed.  Constitutional:      General: She is awake. She is not in acute distress.    Appearance: Normal appearance. She is well-developed. She is not ill-appearing.     Comments: Very pleasant female appears stated age in no acute distress sitting comfortably in exam room  HENT:     Head: Normocephalic and atraumatic.     Right Ear: Tympanic membrane, ear canal and external ear normal. Tympanic membrane is not erythematous or bulging.     Left Ear: Tympanic membrane, ear canal and external ear normal. Tympanic membrane is not erythematous or bulging.     Nose:     Right Sinus: No  maxillary sinus tenderness or frontal sinus tenderness.     Left Sinus: No maxillary sinus tenderness or frontal sinus tenderness.     Mouth/Throat:     Pharynx: Uvula midline. No oropharyngeal exudate or posterior oropharyngeal erythema.  Cardiovascular:     Rate and Rhythm: Normal rate and regular rhythm.     Heart sounds: Normal heart sounds, S1 normal and S2 normal. No murmur heard. Pulmonary:     Effort: Pulmonary effort is normal.     Breath sounds: Normal breath sounds. No wheezing, rhonchi or rales.     Comments: Clear to auscultation bilaterally Abdominal:     General: Bowel sounds are normal.     Palpations: Abdomen is soft.     Tenderness: There is no abdominal tenderness.  Psychiatric:        Behavior: Behavior is cooperative.  UC Treatments / Results  Labs (all labs ordered are listed, but only abnormal results are displayed) Labs Reviewed  POCT URINALYSIS DIP (MANUAL ENTRY) - Abnormal; Notable for the following components:      Result Value   Clarity, UA cloudy (*)    All other components within normal limits  CBC WITH DIFFERENTIAL/PLATELET    EKG   Radiology No results found.  Procedures Procedures (including critical care time)  Medications Ordered in UC Medications - No data to display  Initial Impression / Assessment and Plan / UC Course  I have reviewed the triage vital signs and the nursing notes.  Pertinent labs & imaging results that were available during my care of the patient were reviewed by me and considered in my medical decision making (see chart for details).     Patient is well-appearing, afebrile, nontoxic, nontachycardic.  Discussed that she likely had a virus given her additional congestion symptoms that hopefully is improving.  Viral testing was deferred as she has been symptomatic for 5 days and this would not change management.  UA was obtained as review of systems indicated frequency/urgency but this showed no evidence of  infection.  She does have a history of leukocytosis and is followed by the cancer center with negative bone on prior biopsy in 2016.  Discussed that I am little bit concerned given her presentation so a CBC was obtained today and if her leukocytes are elevated above baseline of approximately 20 K she would need to go to the emergency room.  I also recommended that she follow-up with hematology/oncology soon as possible.  Also recommend she follow-up with her primary care next week.  Discussed that if at any point if thing worsens she has fever not responding medication, shortness of breath, malaise, pain, lethargy she needs to go to the emergency room to which she expressed understanding.  Final Clinical Impressions(s) / UC Diagnoses   Final diagnoses:  Viral illness  Fever, unspecified  Chills  Leukocytosis, unspecified type     Discharge Instructions      Your urine was normal with no evidence of infection.  I believe that you had a virus that hopefully you are getting better from.  Please follow-up with your primary care if your symptoms continue as they can do blood work and investigate this further.  If anything worsens and you have high fever not responding medication, chest pain, shortness of breath, weakness, nausea vomiting interfere with oral intake you need to be seen immediately.     ED Prescriptions   None    PDMP not reviewed this encounter.   Terrilee Croak, PA-C 12/10/22 1520

## 2022-12-10 NOTE — Discharge Instructions (Signed)
Your urine was normal with no evidence of infection.  I believe that you had a virus that hopefully you are getting better from.  Please follow-up with your primary care if your symptoms continue as they can do blood work and investigate this further.  If anything worsens and you have high fever not responding medication, chest pain, shortness of breath, weakness, nausea vomiting interfere with oral intake you need to be seen immediately.

## 2022-12-10 NOTE — ED Triage Notes (Signed)
Pt presents with chills and night sweats with reported fever at home X 5 days.

## 2022-12-12 LAB — CBC WITH DIFFERENTIAL/PLATELET
Basophils Absolute: 0.1 10*3/uL (ref 0.0–0.2)
Basos: 1 %
EOS (ABSOLUTE): 0.3 10*3/uL (ref 0.0–0.4)
Eos: 3 %
Hematocrit: 42.7 % (ref 34.0–46.6)
Hemoglobin: 12.9 g/dL (ref 11.1–15.9)
Immature Grans (Abs): 0 10*3/uL (ref 0.0–0.1)
Immature Granulocytes: 0 %
Lymphocytes Absolute: 1.8 10*3/uL (ref 0.7–3.1)
Lymphs: 15 %
MCH: 27.1 pg (ref 26.6–33.0)
MCHC: 30.2 g/dL — ABNORMAL LOW (ref 31.5–35.7)
MCV: 90 fL (ref 79–97)
Monocytes Absolute: 1.1 10*3/uL — ABNORMAL HIGH (ref 0.1–0.9)
Monocytes: 9 %
Neutrophils Absolute: 8.8 10*3/uL — ABNORMAL HIGH (ref 1.4–7.0)
Neutrophils: 72 %
Platelets: 257 10*3/uL (ref 150–450)
RBC: 4.76 x10E6/uL (ref 3.77–5.28)
RDW: 12.5 % (ref 11.7–15.4)
WBC: 12.2 10*3/uL — ABNORMAL HIGH (ref 3.4–10.8)

## 2022-12-25 DIAGNOSIS — E876 Hypokalemia: Secondary | ICD-10-CM | POA: Diagnosis not present

## 2022-12-25 DIAGNOSIS — E1165 Type 2 diabetes mellitus with hyperglycemia: Secondary | ICD-10-CM | POA: Diagnosis not present

## 2022-12-25 DIAGNOSIS — D649 Anemia, unspecified: Secondary | ICD-10-CM | POA: Diagnosis not present

## 2022-12-25 DIAGNOSIS — Z79899 Other long term (current) drug therapy: Secondary | ICD-10-CM | POA: Diagnosis not present

## 2022-12-25 DIAGNOSIS — E7849 Other hyperlipidemia: Secondary | ICD-10-CM | POA: Diagnosis not present

## 2023-02-26 DIAGNOSIS — H35373 Puckering of macula, bilateral: Secondary | ICD-10-CM | POA: Diagnosis not present

## 2023-03-05 ENCOUNTER — Encounter: Payer: Self-pay | Admitting: Emergency Medicine

## 2023-03-05 ENCOUNTER — Ambulatory Visit (INDEPENDENT_AMBULATORY_CARE_PROVIDER_SITE_OTHER): Payer: Medicare Other

## 2023-03-05 ENCOUNTER — Ambulatory Visit
Admission: EM | Admit: 2023-03-05 | Discharge: 2023-03-05 | Disposition: A | Discharge status: Home / Self Care | Payer: Medicare Other

## 2023-03-05 DIAGNOSIS — R053 Chronic cough: Secondary | ICD-10-CM

## 2023-03-05 DIAGNOSIS — R059 Cough, unspecified: Secondary | ICD-10-CM | POA: Diagnosis not present

## 2023-03-05 DIAGNOSIS — J069 Acute upper respiratory infection, unspecified: Secondary | ICD-10-CM | POA: Diagnosis not present

## 2023-03-05 DIAGNOSIS — M79671 Pain in right foot: Secondary | ICD-10-CM | POA: Diagnosis not present

## 2023-03-05 DIAGNOSIS — W19XXXA Unspecified fall, initial encounter: Secondary | ICD-10-CM

## 2023-03-05 DIAGNOSIS — R079 Chest pain, unspecified: Secondary | ICD-10-CM

## 2023-03-05 MED ORDER — DOXYCYCLINE HYCLATE 100 MG PO CAPS: 100.0000 mg | ORAL_CAPSULE | Freq: Two times a day (BID) | ORAL | 0 refills | Status: DC

## 2023-03-05 MED ORDER — BENZONATATE 100 MG PO CAPS: 100.0000 mg | ORAL_CAPSULE | Freq: Three times a day (TID) | ORAL | 0 refills | Status: DC | PRN

## 2023-03-05 NOTE — ED Provider Notes (Addendum)
[facility]    CSN: [id number] Arrival date & time: 03/05/23  1115      History   Chief Complaint Chief Complaint  Patient presents with   Nasal Congestion   Cough   Leg Pain    HPI [name] is a 67 y.o. female.   Patient presents with several different chief complaints today.  Patient reports approximately 2-week history of nasal congestion and a cough.  Patient reports that cough is productive at times and dry at times.  She reports shortness of breath with exertion as well.  Patient has history of COPD and has been using albuterol inhaler with minimal improvement.  Reports that she has a nebulizer machine at home but has not used this during this acute illness.  Denies chest pain, sore throat, ear pain, gastrointestinal symptoms.  Reports that she have temp max of 100.3 a few days prior.  Denies any known sick contacts.  Patient also reporting right foot pain after a fall that occurred yesterday.  Patient reports that he was spraying for ants so the area of her walkway was wet.  She accidentally slipped on the wet area falling down.  Denies hitting head or losing consciousness.  Reports that her foot went behind her causing her toes to bend.  She reports that she is only having pain in the right great toe and right third toe.  Denies pain in any other part of the body.  Patient not reporting any medications for pain.  Denies numbness or tingling.  Is able to bear weight.   Cough Leg Pain   Past Medical History:  Diagnosis Date   Acute coronary syndrome (HCC)    Acute exacerbation of chronic obstructive airways disease (HCC)    Acute renal failure syndrome (HCC)    Agoraphobia without history of panic disorder    Anginal pain ([place])    Anxiety    Arthritis    "qwhere" (04/03/2018)   Bilateral carpal tunnel syndrome 02/18/2016   Bilateral knee pain    Carpal tunnel syndrome of left wrist    Chest pain    Childhood asthma    Chronic bronchitis (HCC)     Chronic lower back pain    Colitis presumed infectious    COPD (chronic obstructive pulmonary disease) ([place])    Coronary artery disease CARDIOLOGIST- DR  Doylene Canard  (VISIT 03-30-11 W/ CHART)   NON-OBS. CAD   (STRESS TEST NOV. 2011   Depression    DJD (degenerative joint disease)    JOINT PAIN   Dyspnea    "anytime but mostly on exertion and smoking"   Dysrhythmia 2017   SVT   Elevated liver enzymes    Fibromyalgia    Frequency of urination    GERD (gastroesophageal reflux disease)     W/ NEXIUM   Heart murmur    Hemorrhoids    High cholesterol    History of blood transfusion    "related to anemia" (04/03/2018)   History of carpal tunnel syndrome    Right   History of gastric ulcer 2004   Hyperlipidemia    Hypertension    IBS (irritable bowel syndrome)    Incisional pain s/p interstim implant 1st stage--- 12-07-11    left upper buttock-- pt states dressing clean dry and intact (on 12-08-11)   Influenza due to influenza A virus    Insomnia    Internal derangement of left knee    Iron deficiency anemia 2004   Lesion of liver  Leukocytosis    Lumbar pain    Nocturia    Non-productive cough    Numbness in both hands AT TIMES   OSA (obstructive sleep apnea)    "suppose to wear mask; I don't" (04/03/2018)   Osteoarthritis of left knee    PONV (postoperative nausea and vomiting)    Post laminectomy syndrome    Preinfarction syndrome ([place])    Supraventricular tachycardia    SVT (supraventricular tachycardia)    Tobacco user    Urge urinary incontinence     Patient Active Problem List   Diagnosis Date Noted   Acute respiratory failure with hypoxia ([place]) 11/14/2021   Elevated LFTs    Influenza A 11/13/2021   S/P lumbar fusion 01/26/2021   Encounter for orthopedic follow-up care 10/25/2020   Pain in left knee 08/17/2020   Colitis presumed infectious 02/17/2020   Lesion of liver 02/17/2020   History of total knee arthroplasty 02/10/2020   Osteoarthritis of right knee  10/30/2019   Osteoarthritis of left knee 10/30/2019   Internal derangement of left knee 07/07/2019   Bilateral knee pain 03/21/2019   Low back pain [id number]   Complication of surgical procedure 06/17/2018   Degeneration of lumbar intervertebral disc 06/17/2018   Degenerative spondylolisthesis 06/17/2018   Acute coronary syndrome ([place]) 04/02/2018   Hyperlipidemia 08/06/2017   Influenza due to influenza A virus 01/01/2017   COPD with acute exacerbation ([place]) 01/01/2017   Acute renal failure syndrome ([place]) 01/01/2017   Dehydration 01/01/2017   Preinfarction syndrome ([place]) 10/14/2016   Chest pain 08/22/2016   Supraventricular tachycardia 08/22/2016   Obstructive sleep apnea syndrome    Hypertensive disorder    Gastroesophageal reflux disease    Depressive disorder    Coronary arteriosclerosis    Anxiety    Pain in the chest    Hypokalemia    Carpal tunnel syndrome of left wrist 02/18/2016   Leukocytosis 05/26/2014   Tobacco user 05/26/2014   Urge incontinence of urine 12/14/2011    Past Surgical History:  Procedure Laterality Date   ABDOMINAL EXPOSURE N/A 01/26/2021   Procedure: ABDOMINAL EXPOSURE;  Surgeon: Rosetta Posner, MD;  Location: [facility];  Service: Vascular;  Laterality: N/A;   ANTERIOR CERVICAL DECOMP/DISCECTOMY FUSION  2008   C4 - T1 ("screws from 1st OR came out")   ANTERIOR CERVICAL DECOMP/DISCECTOMY FUSION  2002   C4 - 7   ANTERIOR LATERAL LUMBAR FUSION WITH PERCUTANEOUS SCREW 2 LEVEL Left 11/27/2018   Procedure: XLIF Lumbar 3-5, posterior spinal fusion L3-5;  Surgeon: Melina Schools, MD;  Location: [facility];  Service: Orthopedics;  Laterality: Left;  5.5 hrs for entire procedure   ANTERIOR LUMBAR FUSION N/A 01/26/2021   Procedure: ANTERIOR LUMBAR FUSION LUMBAR FIVE-SACRAL ONE WITH POSTERIOR SPINAL FUSION INTERBODY (EXTENSION OF PREVIOUS FUSION);  Surgeon: Melina Schools, MD;  Location: [facility];  Service: Orthopedics;  Laterality: N/A;  6hrs Dr. Donnetta Hutching to do approach Tap  Block with exparel   APPENDECTOMY  1982   BACK SURGERY     CARDIAC CATHETERIZATION  2003   CARDIAC CATHETERIZATION N/A 08/23/2016   Procedure: Left Heart Cath and Coronary Angiography;  Surgeon: Dixie Dials, MD;  Location: [facility];  Service: Cardiovascular;  Laterality: N/A;   CARPAL TUNNEL RELEASE Right    CATARACT EXTRACTION W/ INTRAOCULAR LENS  IMPLANT, BILATERAL  2016-2018   CHOLECYSTECTOMY OPEN  1982   COLONOSCOPY     CYSTO/ HOD/ BLADDER BX  2006   CYSTO/ HOD/ BLADDER BX/ FULGERATION  06-12-2011   EYE SURGERY     FINGER SURGERY Right 2001   "replaced worn cartilage on thumb w/tendons"   HYSTEROSCOPY WITH D & C  2005   INTERSTIM IMPLANT PLACEMENT  12/07/2011   Procedure: Barrie Lyme IMPLANT FIRST STAGE;  Surgeon: [name], MD;  [facility];  Service: Urology;  Laterality: Right;   INTERSTIM IMPLANT PLACEMENT  12/14/2011   Procedure: Barrie Lyme IMPLANT SECOND STAGE;  Surgeon: [name], MD;  Location: [facility];  Service: Urology;  Laterality: Right;  rad tech ok by vickie at [facility]   JOINT REPLACEMENT     LUMBAR LAMINECTOMY  2007   L3 - 5   TONSILLECTOMY AND ADENOIDECTOMY  1965   TOTAL KNEE ARTHROPLASTY Right 02/10/2020   Procedure: TOTAL KNEE ARTHROPLASTY;  Surgeon: Paralee Cancel, MD;  Location: [facility];  Service: Orthopedics;  Laterality: Right;  70 mins   TOTAL KNEE ARTHROPLASTY Left 07/20/2020   Procedure: TOTAL KNEE ARTHROPLASTY;  Surgeon: Paralee Cancel, MD;  Location: [facility];  Service: Orthopedics;  Laterality: Left;   TUBAL LIGATION     UPPER GI ENDOSCOPY     WRIST SURGERY Left    TFCC ("tendon repair")    OB History   No obstetric history on file.      Home Medications    Prior to Admission medications   Medication Sig Start Date End Date Taking? Authorizing Provider  benzonatate (TESSALON) 100 MG capsule Take 1 capsule (100 mg total) by mouth every 8 (eight) hours as needed for cough. 03/05/23  Yes  Cyril Railey, Hildred Alamin E, FNP  cyclobenzaprine (FLEXERIL) 10 MG tablet Take 10 mg by mouth at bedtime as needed. 02/18/23  Yes [provider]  doxycycline (VIBRAMYCIN) 100 MG capsule Take 1 capsule (100 mg total) by mouth 2 (two) times daily. 03/05/23  Yes Dorie Ohms, Hildred Alamin E, FNP  hydrALAZINE (APRESOLINE) 10 MG tablet Take 10 mg by mouth 2 (two) times daily. 02/23/23  Yes [provider]  hydrOXYzine (ATARAX) 50 MG tablet Take 25-50 mg by mouth 2 (two) times daily as needed. 12/24/22  Yes [provider]  acetaminophen (TYLENOL) 500 MG tablet Take 1,000-1,500 mg by mouth daily as needed for headache (pain).    [provider]  albuterol (VENTOLIN HFA) 108 (90 Base) MCG/ACT inhaler Inhale 2 puffs into the lungs every 6 (six) hours as needed for wheezing or shortness of breath. 11/18/21   Hosie Poisson, MD  alendronate (FOSAMAX) 70 MG tablet Take 70 mg by mouth every Thursday. Take with a full glass of water on an empty stomach.    [provider]  aspirin EC 81 MG tablet Take 81 mg by mouth 2 (two) times daily. Swallow whole.    [provider]  diltiazem (CARDIZEM CD) 120 MG 24 hr capsule TAKE 1 CAPSULE (120 MG TOTAL) BY MOUTH DAILY. PLEASE SCHEDULE APPT FOR FUTURE REFILLS. 1ST ATTEMPT Patient taking differently: Take 120 mg by mouth every morning. Please schedule appt for future refills. 1st attempt 05/31/21   Constance Haw, MD  DULoxetine (CYMBALTA) 60 MG capsule Take 60 mg by mouth at bedtime.    [provider]  hydrALAZINE (APRESOLINE) 25 MG tablet Take 25 mg by mouth 2 (two) times daily.    [provider]  ipratropium-albuterol (DUONEB) 0.5-2.5 (3) MG/3ML SOLN Take 3 mLs by nebulization every 6 (six) hours as needed. Patient not taking: Reported on 05/17/2022 11/18/21   Hosie Poisson, MD  KLOR-CON M10 10  MEQ tablet Take 10 mEq by mouth daily. 09/01/22   [provider]  Loperamide-Simethicone (ANTI-DIARRHEAL PLUS ANTI-GAS PO) Take  2 tablets by mouth daily as needed (gas and diarrhea).    [provider]  nitroGLYCERIN (NITROSTAT) 0.4 MG SL tablet Place 1 tablet (0.4 mg total) under the tongue every 5 (five) minutes x 3 doses as needed for chest pain. Patient not taking: Reported on 09/18/2022 08/24/16   Eugenie Filler, MD  pantoprazole (PROTONIX) 40 MG tablet Take 1 tablet (40 mg total) by mouth 2 (two) times daily. 02/20/20   Harold Hedge, MD    Family History Family History  Problem Relation Age of Onset   Hypertension Father    Heart disease Father    Kidney failure Father    Breast cancer Mother    Hypertension Mother    Coronary artery disease Mother    Alzheimer's disease Mother    Hypertension Brother    Hypertension Sister    Hypertension Daughter     Social History Social History   Tobacco Use   Smoking status: Every Day    Packs/day: 2.00    Years: 43.00    Additional pack years: 0.00    Total pack years: 86.00    Types: Cigarettes   Smokeless tobacco: Never  Vaping Use   Vaping Use: Never used  Substance Use Topics   Alcohol use: Not Currently    Alcohol/week: 0.0 standard drinks of alcohol    Comment: 04/03/2018 "nothing in years"   Drug use: Not Currently    Types: Marijuana     Allergies   Midazolam hcl, Prednisone, Statins, Ace inhibitors, Amoxicillin, Nsaids, and Sulfa antibiotics   Review of Systems Review of Systems Per HPI  Physical Exam Triage Vital Signs ED Triage Vitals  Enc Vitals Group     BP 03/05/23 1126 (!) 163/73     Pulse Rate 03/05/23 1126 61     Resp 03/05/23 1126 18     Temp 03/05/23 1126 98 F (36.7 C)     Temp Source 03/05/23 1126 Oral     SpO2 03/05/23 1126 94 %     Weight --      Height --      Head Circumference --      Peak Flow --      Pain Score 03/05/23 1124 5     Pain Loc --      Pain Edu? --      Excl. in [place]? --    No data found.  Updated Vital Signs BP (!) 152/72 (BP Location: Left Arm)   Pulse 61   Temp 98 F (36.7  C) (Oral)   Resp 18   LMP 09/06/2005 Comment: tubal ligation  SpO2 96%   Visual Acuity Right Eye Distance:   Left Eye Distance:   Bilateral Distance:    Right Eye Near:   Left Eye Near:    Bilateral Near:     Physical Exam Constitutional:      General: She is not in acute distress.    Appearance: Normal appearance. She is not toxic-appearing.  HENT:     Head: Normocephalic and atraumatic.     Right Ear: Tympanic membrane and ear canal normal.     Left Ear: Tympanic membrane and ear canal normal.     Nose: Congestion present.     Mouth/Throat:     Mouth: Mucous membranes are moist.     Pharynx: No posterior oropharyngeal erythema.  Eyes:     Extraocular Movements: Extraocular movements intact.     Conjunctiva/sclera: Conjunctivae normal.     Pupils: Pupils are equal, round, and reactive to light.  Cardiovascular:     Rate and Rhythm: Normal rate and regular rhythm.     Pulses: Normal pulses.     Heart sounds: Normal heart sounds.  Pulmonary:     Effort: Pulmonary effort is normal. No respiratory distress.     Breath sounds: Normal breath sounds. No stridor. No wheezing, rhonchi or rales.  Abdominal:     General: Abdomen is flat. Bowel sounds are normal.     Palpations: Abdomen is soft.  Musculoskeletal:        General: Normal range of motion.     Cervical back: Normal range of motion.  Feet:     Comments: Patient has bruising discoloration present to underside of right great toe with minimal swelling.  Tenderness to palpation to right great toe and throughout right third toe as well.  No swelling or discoloration noted to right third toe.  No other tenderness to palpation to toes, foot, ankle, lower extremity.  Patient can wiggle toes.  Capillary refill and pulses intact.  Patient is neurovascularly intact.  No abrasions or lacerations noted. Skin:    General: Skin is warm and dry.  Neurological:     General: No focal deficit present.     Mental Status: She is alert  and oriented to person, place, and time. Mental status is at baseline.  Psychiatric:        Mood and Affect: Mood normal.        Behavior: Behavior normal.      UC Treatments / Results  Labs (all labs ordered are listed, but only abnormal results are displayed) Labs Reviewed - No data to display  EKG   Radiology DG Foot Complete Right  Result Date: 03/05/2023 CLINICAL DATA:  Right foot pain after fall 1 day ago EXAM: RIGHT FOOT COMPLETE - 3+ VIEW COMPARISON:  None Available. FINDINGS: Linear lucency seen at the plantar aspect of the base of the great toe distal phalanx. Nondisplaced fracture not excluded. No additional sites of fracture. No dislocation. Hallux valgus deformity. Mild osteoarthritic changes of the foot. No focal soft tissue swelling IMPRESSION: Linear lucency at the plantar aspect of the base of the great toe distal phalanx. Nondisplaced fracture not excluded. Correlate with point tenderness. Electronically Signed   By: Davina Poke D.O.   On: 03/05/2023 13:58   DG Chest 2 View  Result Date: 03/05/2023 CLINICAL DATA:  Congestion and productive cough for 2 weeks. EXAM: CHEST - 2 VIEW COMPARISON:  Chest radiographs 12/30/2021 and 12/07/2021 FINDINGS: Cardiac silhouette and mediastinal contours are within normal limits. The lungs are clear. No pleural effusion pneumothorax. Moderate multilevel degenerative disc changes of the thoracic spine, greatest at mid height. Mild levocurvature of the mid to upper thoracic spine and minimal dextrocurvature of the mid to lower thoracic spine. ACDF hardware again is seen at the approximate C3-4 and C5 through C7 levels. Upper abdominal surgical clips are again seen, likely cholecystectomy clips. IMPRESSION: No active cardiopulmonary disease. Electronically Signed   By: Yvonne Kendall M.D.   On: 03/05/2023 13:54    Procedures Procedures (including critical care time)  Medications Ordered in UC Medications - No data to display  Initial  Impression / Assessment and Plan / UC Course  I have reviewed the triage vital signs and the nursing notes.  Pertinent labs & imaging  results that were available during my care of the patient were reviewed by me and considered in my medical decision making (see chart for details).     1.  Acute upper respiratory infection Patient's symptoms most likely started off as viral but given duration of symptoms there is concern for secondary bacterial infection.  Possible COPD exacerbation as well given shortness of breath with exertion.  Will treat with doxycycline and cough medication.  Chest x-ray completed that was negative for any acute cardiopulmonary process.  It did show degenerative changes of the spine which patient is aware of.  Patient's oxygen is normal and no signs of tachypnea, so do not think that emergent evaluation is necessary at this time.  Encouraged albuterol nebulizer use at home during this acute phase as well.  2.  Fall/foot pain Right foot x-ray is showing possible fracture of the right great toe. Radiology not able to confirm but the imaging and point tenderness is consistent with this.  Postop shoe placed by clinical staff for comfort and support.  Advised strict follow-up with orthopedist.  Patient reports that she has established relationship with [facility].  Advised that if they are not able to treat foot complaints,  that she is to follow-up with [facility].  She voiced understanding of this.  Advised elevation of extremity, ice application, supportive care.  Will avoid narcotic pain medication with shared decision making given patient's current respiratory complaints to ensure no further complications related to this.  Advised safe over-the-counter pain relievers and supportive care.  Patient verbalized understanding and was agreeable with plan. Final Clinical Impressions(s) / UC Diagnoses   Final diagnoses:  Acute upper respiratory infection  Persistent cough   Right foot pain  Fall, initial encounter     Discharge Instructions      I have ordered an antibiotic and a cough medication for your nasal congestion and cough.  Chest x-ray is ordered at [facility] as well as a foot x-ray.  Please go there today to have x-ray imaging completed.  I will call if there are any abnormalities.  Also recommend that you elevate foot and apply ice to affected areas of pain.  Follow-up with [facility] if foot pain persists or worsens.  Follow-up with PCP or urgent care if any other symptoms persist or worsen.    ED Prescriptions     Medication Sig Dispense Auth. Provider   doxycycline (VIBRAMYCIN) 100 MG capsule Take 1 capsule (100 mg total) by mouth 2 (two) times daily. 20 capsule [place], Maish Vaya E, [place]   benzonatate (TESSALON) 100 MG capsule Take 1 capsule (100 mg total) by mouth every 8 (eight) hours as needed for cough. 21 capsule [place], [place], [place]      I have reviewed the PDMP during this encounter.   Teodora Medici, [place] 03/05/23 [place], [place], [place] 03/05/23 1306    Teodora Medici, [place] 03/05/23 1406

## 2023-03-05 NOTE — Discharge Instructions (Addendum)
I have ordered an antibiotic and a cough medication for your nasal congestion and cough.  Chest x-ray is ordered at [facility] as well as a foot x-ray.  Please go there today to have x-ray imaging completed.  I will call if there are any abnormalities.  Also recommend that you elevate foot and apply ice to affected areas of pain.  Follow-up with [facility] if foot pain persists or worsens.  Follow-up with PCP or urgent care if any other symptoms persist or worsen.

## 2023-03-05 NOTE — ED Triage Notes (Addendum)
Pt presents with congestion and productive cough xs 2 weeks. Pt also c/o right leg pain after fall yesterday.

## 2023-03-08 ENCOUNTER — Other Ambulatory Visit: Payer: Self-pay | Admitting: Orthopaedic Surgery

## 2023-03-08 DIAGNOSIS — M79671 Pain in right foot: Secondary | ICD-10-CM | POA: Diagnosis not present

## 2023-03-08 DIAGNOSIS — M2011 Hallux valgus (acquired), right foot: Secondary | ICD-10-CM | POA: Diagnosis not present

## 2023-03-09 ENCOUNTER — Ambulatory Visit
Admission: RE | Admit: 2023-03-09 | Discharge: 2023-03-09 | Disposition: A | Discharge status: Home / Self Care | Payer: Medicare Other | Source: Ambulatory Visit | Attending: Orthopaedic Surgery | Admitting: Orthopaedic Surgery

## 2023-03-09 DIAGNOSIS — M79671 Pain in right foot: Secondary | ICD-10-CM

## 2023-03-12 DIAGNOSIS — R7303 Prediabetes: Secondary | ICD-10-CM | POA: Diagnosis not present

## 2023-03-12 DIAGNOSIS — Z01818 Encounter for other preprocedural examination: Secondary | ICD-10-CM | POA: Diagnosis not present

## 2023-03-12 DIAGNOSIS — M25871 Other specified joint disorders, right ankle and foot: Secondary | ICD-10-CM | POA: Diagnosis not present

## 2023-03-12 DIAGNOSIS — I10 Essential (primary) hypertension: Secondary | ICD-10-CM | POA: Diagnosis not present

## 2023-03-12 DIAGNOSIS — M79674 Pain in right toe(s): Secondary | ICD-10-CM | POA: Diagnosis not present

## 2023-03-14 DIAGNOSIS — H35371 Puckering of macula, right eye: Secondary | ICD-10-CM | POA: Diagnosis not present

## 2023-03-14 DIAGNOSIS — H43813 Vitreous degeneration, bilateral: Secondary | ICD-10-CM | POA: Diagnosis not present

## 2023-03-14 DIAGNOSIS — Z961 Presence of intraocular lens: Secondary | ICD-10-CM | POA: Diagnosis not present

## 2023-03-21 ENCOUNTER — Encounter: Payer: Self-pay | Admitting: Nurse Practitioner

## 2023-03-21 ENCOUNTER — Inpatient Hospital Stay: Payer: Medicare Other | Attending: Nurse Practitioner

## 2023-03-21 ENCOUNTER — Inpatient Hospital Stay (HOSPITAL_BASED_OUTPATIENT_CLINIC_OR_DEPARTMENT_OTHER): Payer: Medicare Other | Admitting: Nurse Practitioner

## 2023-03-21 VITALS — BP 135/63 | HR 74 | Temp 98.2°F | Resp 18 | Ht <= 58 in | Wt 172.8 lb

## 2023-03-21 DIAGNOSIS — I2510 Atherosclerotic heart disease of native coronary artery without angina pectoris: Secondary | ICD-10-CM | POA: Insufficient documentation

## 2023-03-21 DIAGNOSIS — J449 Chronic obstructive pulmonary disease, unspecified: Secondary | ICD-10-CM | POA: Diagnosis not present

## 2023-03-21 DIAGNOSIS — Z87891 Personal history of nicotine dependence: Secondary | ICD-10-CM | POA: Diagnosis not present

## 2023-03-21 DIAGNOSIS — D1803 Hemangioma of intra-abdominal structures: Secondary | ICD-10-CM | POA: Diagnosis not present

## 2023-03-21 DIAGNOSIS — D72829 Elevated white blood cell count, unspecified: Secondary | ICD-10-CM | POA: Diagnosis not present

## 2023-03-21 DIAGNOSIS — R160 Hepatomegaly, not elsewhere classified: Secondary | ICD-10-CM | POA: Diagnosis not present

## 2023-03-21 DIAGNOSIS — I10 Essential (primary) hypertension: Secondary | ICD-10-CM | POA: Diagnosis not present

## 2023-03-21 DIAGNOSIS — K760 Fatty (change of) liver, not elsewhere classified: Secondary | ICD-10-CM | POA: Diagnosis not present

## 2023-03-21 LAB — CBC WITH DIFFERENTIAL (CANCER CENTER ONLY)
Abs Immature Granulocytes: 0.06 10*3/uL (ref 0.00–0.07)
Basophils Absolute: 0.1 10*3/uL (ref 0.0–0.1)
Basophils Relative: 1 %
Eosinophils Absolute: 0.4 10*3/uL (ref 0.0–0.5)
Eosinophils Relative: 2 %
HCT: 39.1 % (ref 36.0–46.0)
Hemoglobin: 12.1 g/dL (ref 12.0–15.0)
Immature Granulocytes: 0 %
Lymphocytes Relative: 16 %
Lymphs Abs: 2.5 10*3/uL (ref 0.7–4.0)
MCH: 26.1 pg (ref 26.0–34.0)
MCHC: 30.9 g/dL (ref 30.0–36.0)
MCV: 84.3 fL (ref 80.0–100.0)
Monocytes Absolute: 1 10*3/uL (ref 0.1–1.0)
Monocytes Relative: 6 %
Neutro Abs: 11.8 10*3/uL — ABNORMAL HIGH (ref 1.7–7.7)
Neutrophils Relative %: 75 %
Platelet Count: 340 10*3/uL (ref 150–400)
RBC: 4.64 MIL/uL (ref 3.87–5.11)
RDW: 13.8 % (ref 11.5–15.5)
WBC Count: 15.9 10*3/uL — ABNORMAL HIGH (ref 4.0–10.5)
nRBC: 0 % (ref 0.0–0.2)

## 2023-03-21 NOTE — Progress Notes (Signed)
  [facility] OFFICE PROGRESS NOTE   Diagnosis: Leukocytosis  INTERVAL HISTORY:   [name] returns as scheduled.  No fever.  She has periodic night sweats, unchanged.  She has not noticed any enlarged lymph nodes.  She reports a recent sinus infection.  She has a good appetite.  She has gained weight.  She notes being more fatigued over the past 6 months.  Objective:  Vital signs in last 24 hours:  Blood pressure 135/63, pulse 74, temperature 98.2 F (36.8 C), temperature source Oral, resp. rate 18, height 4\' 10"  (1.473 m), weight 172 lb 12.8 oz (78.4 kg), last menstrual period 09/06/2005, SpO2 98 %.    Lymphatics: No palpable cervical, supraclavicular, axillary or inguinal lymph nodes. Resp: Lungs clear bilaterally. Cardio: Regular rate and rhythm. GI: No splenomegaly.  Liver palpable in the right upper abdomen. Vascular: No leg edema.   Lab Results:  Lab Results  Component Value Date   WBC 15.9 (H) 03/21/2023   HGB 12.1 03/21/2023   HCT 39.1 03/21/2023   MCV 84.3 03/21/2023   PLT 340 03/21/2023   NEUTROABS 11.8 (H) 03/21/2023    Imaging:  No results found.  Medications: I have reviewed the patient's current medications.  Assessment/Plan: Leukocytosis 05/27/2015-BCR/ABL not detected, negative for JAK2 V617F mutation 09/15/2015 bone marrow biopsy-normocellular bone marrow with trilineage hematopoiesis, no definite morphologic evidence of a myeloproliferative or myelodysplastic process, cytogenetic analysis unremarkable 05/01/2022 advanced NGS JAK2, MPL and CALR normal Hepatomegaly on exam 05/01/2022 05/03/2022 CT abdomen/pelvis-no acute findings.  Stable mild hepatomegaly and diffuse hepatic steatosis.  Stable small benign hemangioma right hepatic lobe COPD Hypertension CAD Quit smoking November 2022  Disposition: [name] appears unchanged.  She has stable leukocytosis.  She will return for a CBC and follow-up visit in 6 months.    Ned Card  ANP/GNP-BC   03/21/2023  1:47 PM

## 2023-03-30 ENCOUNTER — Other Ambulatory Visit: Payer: Self-pay | Admitting: *Deleted

## 2023-03-30 DIAGNOSIS — Z87891 Personal history of nicotine dependence: Secondary | ICD-10-CM

## 2023-03-30 DIAGNOSIS — I6529 Occlusion and stenosis of unspecified carotid artery: Secondary | ICD-10-CM

## 2023-03-30 DIAGNOSIS — Z122 Encounter for screening for malignant neoplasm of respiratory organs: Secondary | ICD-10-CM

## 2023-04-02 DIAGNOSIS — Z0000 Encounter for general adult medical examination without abnormal findings: Secondary | ICD-10-CM | POA: Diagnosis not present

## 2023-04-02 DIAGNOSIS — M810 Age-related osteoporosis without current pathological fracture: Secondary | ICD-10-CM | POA: Diagnosis not present

## 2023-04-02 DIAGNOSIS — E782 Mixed hyperlipidemia: Secondary | ICD-10-CM | POA: Diagnosis not present

## 2023-04-02 DIAGNOSIS — R252 Cramp and spasm: Secondary | ICD-10-CM | POA: Diagnosis not present

## 2023-04-02 DIAGNOSIS — E538 Deficiency of other specified B group vitamins: Secondary | ICD-10-CM | POA: Diagnosis not present

## 2023-04-02 DIAGNOSIS — R7303 Prediabetes: Secondary | ICD-10-CM | POA: Diagnosis not present

## 2023-04-02 DIAGNOSIS — I10 Essential (primary) hypertension: Secondary | ICD-10-CM | POA: Diagnosis not present

## 2023-04-02 DIAGNOSIS — G4733 Obstructive sleep apnea (adult) (pediatric): Secondary | ICD-10-CM | POA: Diagnosis not present

## 2023-04-02 DIAGNOSIS — R5383 Other fatigue: Secondary | ICD-10-CM | POA: Diagnosis not present

## 2023-04-02 DIAGNOSIS — J449 Chronic obstructive pulmonary disease, unspecified: Secondary | ICD-10-CM | POA: Diagnosis not present

## 2023-04-02 DIAGNOSIS — E559 Vitamin D deficiency, unspecified: Secondary | ICD-10-CM | POA: Diagnosis not present

## 2023-04-02 DIAGNOSIS — F418 Other specified anxiety disorders: Secondary | ICD-10-CM | POA: Diagnosis not present

## 2023-04-04 ENCOUNTER — Encounter (HOSPITAL_BASED_OUTPATIENT_CLINIC_OR_DEPARTMENT_OTHER): Payer: Self-pay | Admitting: Orthopaedic Surgery

## 2023-04-04 ENCOUNTER — Other Ambulatory Visit: Payer: Self-pay

## 2023-04-04 NOTE — Progress Notes (Signed)
Chart reviewed with Dr. Bradley Ferris due to cardiac history, patient will need to see cardiology BEFORE palnned surgery for clearance. Will also need asa hold time. 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 Per Dr. Bradley Ferris, patient ok to still have surgery at [facility] without remote.

## 2023-04-05 NOTE — Discharge Instructions (Addendum)
Brenda Cedars, MD [facility]  Please read the following information regarding your care after surgery.  Medications  You only need a prescription for the narcotic pain medicine (ex. oxycodone, Percocet, Norco).  All of the other medicines listed below are available over the counter. ? Aleve 2 pills twice a day for the first 3 days after surgery. ? acetominophen (Tylenol) 650 mg every 4-6 hours as you need for minor to moderate pain ? oxycodone as prescribed for severe pain  ? To help prevent blood clots, take aspirin (81 mg) twice daily for 42 days after surgery (or total duration of nonweightbearing).  You should also get up every hour while you are awake to move around.  Weight Bearing ? Do NOT bear any weight on the operated leg or foot. This means do NOT touch your surgical leg to the ground!  Cast / Splint / Dressing ? If you have a splint, do NOT remove this. Keep your splint, cast or dressing clean and dry.  Don't put anything (coat hanger, pencil, etc) down inside of it.  If it gets wet, call the office immediately to schedule an appointment for a cast change.  Swelling IMPORTANT: It is normal for you to have swelling where you had surgery. To reduce swelling and pain, keep at least 3 pillows under your leg so that your toes are above your nose and your heel is above the level of your hip.  It may be necessary to keep your foot or leg elevated for several weeks.  This is critical to helping your incisions heal and your pain to feel better.  Follow Up Call my office at [phone] Post Anesthesia Home Care Instructions  Activity: Get plenty of rest for the remainder of the day. A responsible individual must stay with you for 24 hours following the procedure.  For the next 24 hours, DO NOT: -Drive a car -Advertising copywriter -Drink alcoholic beverages -Take any medication unless instructed by your physician -Make any legal decisions or sign important papers.  Meals: Start  with liquid foods such as gelatin or soup. Progress to regular foods as tolerated. Avoid greasy, spicy, heavy foods. If nausea and/or vomiting occur, drink only clear liquids until the nausea and/or vomiting subsides. Call your physician if vomiting continues.  Special Instructions/Symptoms: Your throat may feel dry or sore from the anesthesia or the breathing tube placed in your throat during surgery. If this causes discomfort, gargle with warm salt water. The discomfort should disappear within 24 hours.  If you had a scopolamine patch placed behind your ear for the management of post- operative nausea and/or vomiting:  1. The medication in the patch is effective for 72 hours, after which it should be removed.  Wrap patch in a tissue and discard in the trash. Wash hands thoroughly with soap and water. 2. You may remove the patch earlier than 72 hours if you experience unpleasant side effects which may include dry mouth, dizziness or visual disturbances. 3. Avoid touching the patch. Wash your hands with soap and water after contact with the patch.    Post Anesthesia Home Care Instructions  Activity: Get plenty of rest for the remainder of the day. A responsible individual must stay with you for 24 hours following the procedure.  For the next 24 hours, DO NOT: -Drive a car -Advertising copywriter -Drink alcoholic beverages -Take any medication unless instructed by your physician -Make any legal decisions or sign important papers.  Meals: Start with liquid foods such as  gelatin or soup. Progress to regular foods as tolerated. Avoid greasy, spicy, heavy foods. If nausea and/or vomiting occur, drink only clear liquids until the nausea and/or vomiting subsides. Call your physician if vomiting continues.  Special Instructions/Symptoms: Your throat may feel dry or sore from the anesthesia or the breathing tube placed in your throat during surgery. If this causes discomfort, gargle with warm salt water.  The discomfort should disappear within 24 hours.  If you had a scopolamine patch placed behind your ear for the management of post- operative nausea and/or vomiting:  1. The medication in the patch is effective for 72 hours, after which it should be removed.  Wrap patch in a tissue and discard in the trash. Wash hands thoroughly with soap and water. 2. You may remove the patch earlier than 72 hours if you experience unpleasant side effects which may include dry mouth, dizziness or visual disturbances. 3. Avoid touching the patch. Wash your hands with soap and water after contact with the patch.   Regional Anesthesia Blocks  1. Numbness or the inability to move the "blocked" extremity may last from 3-48 hours after placement. The length of time depends on the medication injected and your individual response to the medication. If the numbness is not going away after 48 hours, call your surgeon.  2. The extremity that is blocked will need to be protected until the numbness is gone and the  Strength has returned. Because you cannot feel it, you will need to take extra care to avoid injury. Because it may be weak, you may have difficulty moving it or using it. You may not know what position it is in without looking at it while the block is in effect.  3. For blocks in the legs and feet, returning to weight bearing and walking needs to be done carefully. You will need to wait until the numbness is entirely gone and the strength has returned. You should be able to move your leg and foot normally before you try and bear weight or walk. You will need someone to be with you when you first try to ensure you do not fall and possibly risk injury.  4. Bruising and tenderness at the needle site are common side effects and will resolve in a few days.  5. Persistent numbness or new problems with movement should be communicated to the surgeon or the [facility] [phone] [facility]  [phone]).Regional Anesthesia Blocks  1. Numbness or the inability to move the "blocked" extremity may last from 3-48 hours after placement. The length of time depends on the medication injected and your individual response to the medication. If the numbness is not going away after 48 hours, call your surgeon.  2. The extremity that is blocked will need to be protected until the numbness is gone and the  Strength has returned. Because you cannot feel it, you will need to take extra care to avoid injury. Because it may be weak, you may have difficulty moving it or using it. You may not know what position it is in without looking at it while the block is in effect.  3. For blocks in the legs and feet, returning to weight bearing and walking needs to be done carefully. You will need to wait until the numbness is entirely gone and the strength has returned. You should be able to move your leg and foot normally before you try and bear weight or walk. You will need someone  to be with you when you first try to ensure you do not fall and possibly risk injury.  4. Bruising and tenderness at the needle site are common side effects and will resolve in a few days.  5. Persistent numbness or new problems with movement should be communicated to the surgeon or the [facility] [phone] [facility] [phone]). when you are discharged from the hospital or surgery center to schedule an appointment to be seen 7-10 days after surgery.  Call my office at [phone] if you develop a fever >101.5 F, nausea, vomiting, bleeding from the surgical site or severe pain.

## 2023-04-05 NOTE — H&P (Signed)
ORTHOPAEDIC SURGERY H&P  Subjective:  The patient presents with right hallux rigidus.   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 CAD   (STRESS TEST NOV. 2011   Depression    DJD (degenerative joint disease)    JOINT PAIN   Dyspnea    "anytime but mostly on exertion and smoking"   Dysrhythmia 2017   SVT   Elevated liver enzymes    Fibromyalgia    Frequency of urination    GERD (gastroesophageal reflux disease)     W/ NEXIUM   Heart murmur    Hemorrhoids    High cholesterol    History of blood transfusion    "related to anemia" (04/03/2018)   History of carpal tunnel syndrome    Right   History of gastric ulcer 2004   Hyperlipidemia    Hypertension    IBS (irritable bowel syndrome)    Incisional pain s/p interstim implant 1st stage--- 12-07-11    left upper buttock-- pt states dressing clean dry and intact (on 12-08-11)   Influenza due to influenza A virus    Insomnia    Internal derangement of left knee    Iron deficiency anemia 2004   Lesion of liver    Leukocytosis    Lumbar pain    Nocturia    Non-productive cough    Numbness in both hands AT TIMES   OSA (obstructive sleep apnea)    "suppose to wear mask; I don't" (04/03/2018)   Osteoarthritis of left knee    PONV (postoperative nausea and vomiting)    Post laminectomy syndrome    Preinfarction syndrome     Supraventricular tachycardia    SVT (supraventricular tachycardia)    Tobacco user    Urge urinary incontinence     Past Surgical History:  Procedure Laterality Date   ABDOMINAL EXPOSURE N/A 01/26/2021   Procedure: ABDOMINAL EXPOSURE;  Surgeon: Larina Earthly, MD;  Location: [facility] OR;  Service: Vascular;  Laterality: N/A;   ANTERIOR CERVICAL DECOMP/DISCECTOMY FUSION  2008   C4 - T1 ("screws from 1st OR came out")   ANTERIOR CERVICAL DECOMP/DISCECTOMY FUSION  2002   C4 - 7   ANTERIOR LATERAL LUMBAR FUSION WITH PERCUTANEOUS SCREW 2 LEVEL Left 11/27/2018   Procedure: XLIF Lumbar 3-5, posterior spinal fusion L3-5;  Surgeon: Venita Lick, MD;  Location: [facility] OR;  Service: Orthopedics;  Laterality: Left;  5.5 hrs for entire procedure   ANTERIOR LUMBAR FUSION N/A 01/26/2021   Procedure: ANTERIOR LUMBAR FUSION LUMBAR FIVE-SACRAL ONE WITH POSTERIOR SPINAL FUSION INTERBODY (EXTENSION OF PREVIOUS FUSION);  Surgeon: Venita Lick, MD;  Location: [facility] OR;  Service: Orthopedics;  Laterality: N/A;  6hrs Dr. Arbie Cookey to do approach Tap Block with exparel   APPENDECTOMY  1982   BACK SURGERY  CARDIAC CATHETERIZATION  2003   CARDIAC CATHETERIZATION N/A 08/23/2016   Procedure: Left Heart Cath and Coronary Angiography;  Surgeon: Orpah Cobb, MD;  Location: [facility] INVASIVE CV [facility];  Service: Cardiovascular;  Laterality: N/A;   CARPAL TUNNEL RELEASE Right    CATARACT EXTRACTION W/ INTRAOCULAR LENS  IMPLANT, BILATERAL  2016-2018   CHOLECYSTECTOMY OPEN  1982   COLONOSCOPY     CYSTO/ HOD/ BLADDER BX  2006   CYSTO/ HOD/ BLADDER BX/ FULGERATION  06-12-2011   EYE SURGERY     FINGER SURGERY Right 2001   "replaced worn cartilage on thumb w/tendons"   HYSTEROSCOPY WITH D & C  2005   INTERSTIM IMPLANT PLACEMENT  12/07/2011   Procedure: Leane Platt IMPLANT FIRST STAGE;  Surgeon: Martina Sinner, MD;  Location: [facility];  Service: Urology;  Laterality: Right;   INTERSTIM IMPLANT PLACEMENT  12/14/2011    Procedure: Leane Platt IMPLANT SECOND STAGE;  Surgeon: Martina Sinner, MD;  Location: [facility];  Service: Urology;  Laterality: Right;  rad tech ok by vickie at main   JOINT REPLACEMENT     LUMBAR LAMINECTOMY  2007   L3 - 5   TONSILLECTOMY AND ADENOIDECTOMY  1965   TOTAL KNEE ARTHROPLASTY Right 02/10/2020   Procedure: TOTAL KNEE ARTHROPLASTY;  Surgeon: Durene Romans, MD;  Location: [facility];  Service: Orthopedics;  Laterality: Right;  70 mins   TOTAL KNEE ARTHROPLASTY Left 07/20/2020   Procedure: TOTAL KNEE ARTHROPLASTY;  Surgeon: Durene Romans, MD;  Location: [facility];  Service: Orthopedics;  Laterality: Left;   TUBAL LIGATION     UPPER GI ENDOSCOPY     WRIST SURGERY Left    TFCC ("tendon repair")     (Not in an outpatient encounter)    Allergies  Allergen Reactions   Midazolam Hcl Other (See Comments)    HYPER   Prednisone Other (See Comments)    HYPER, depressed, moody   Statins Other (See Comments)    Causes muscle weakness and joint pain   Ace Inhibitors Other (See Comments)    DECREASES HEARTRATE   Amoxicillin Diarrhea    Did it involve swelling of the face/tongue/throat, SOB, or low BP? No Did it involve sudden or severe rash/hives, skin peeling, or any reaction on the inside of your mouth or nose? No Did you need to seek medical attention at a hospital or doctor's office? No When did it last happen?  ~2019 or so If all above answers are "NO", may proceed with cephalosporin use.    Nsaids Other (See Comments)    AVOIDS; HX GASTRIC ULCER   Sulfa Antibiotics Rash and Other (See Comments)    JOINT PAIN    Social History   Socioeconomic History   Marital status: Widowed    Spouse name: Not on file   Number of children: Not on file   Years of education: Not on file   Highest education level: Not on file  Occupational History   Not on file  Tobacco Use   Smoking status: Every Day    Packs/day: 2.00    Years: 43.00    Additional pack years: 0.00     Total pack years: 86.00    Types: Cigarettes   Smokeless tobacco: Never  Vaping Use   Vaping Use: Never used  Substance and Sexual Activity   Alcohol use: Not Currently    Alcohol/week: 0.0 standard drinks of alcohol    Comment: 04/03/2018 "nothing in years"   Drug use: Not  Currently    Types: Marijuana   Sexual activity: Not on file  Other Topics Concern   Not on file  Social History Narrative   Lives alone in a [place] home [place] a [place] with her 2 dogs.   On disability since 2009.  Previously worked as a Designer, industrial/product.    Education: high school   Social Determinants of Health   Financial Resource Strain: High Risk (08/15/2022)   Overall Financial Resource Strain (CARDIA)    Difficulty of Paying Living Expenses: Very hard  Food Insecurity: Food Insecurity Present (08/15/2022)   Hunger Vital Sign    Worried About Running Out of Food in the Last Year: Often true    Ran Out of Food in the Last Year: Never true  Transportation Needs: No Transportation Needs (08/10/2022)   PRAPARE - Administrator, Civil Service (Medical): No    Lack of Transportation (Non-Medical): No  Physical Activity: Not on file  Stress: Not on file  Social Connections: Not on file  Intimate Partner Violence: Not on file     History reviewed. No pertinent family history.   Review of Systems Pertinent items are noted in HPI.  Objective: Vital signs in last 24 hours:    04/04/2023    9:45 AM 03/21/2023    1:17 PM 03/05/2023   11:39 AM  Vitals with BMI  Height 4' 9.5"    Weight 168 lbs 172 lbs 13 oz   BMI 35.7 36.13   Systolic  135 152  Diastolic  63 72  Pulse  74       EXAM: General: Well nourished, well developed. Awake, alert and oriented to time, place, person. Normal mood and affect. No apparent distress. Breathing room air.  Operative Lower Extremity: Alignment - Neutral Deformity - None Skin intact Tenderness to palpation - right 1st MTP  5/5 TA,  PT, GS, Per, EHL, FHL Sensation intact to light touch throughout Palpable DP and PT pulses Special testing: None  The contralateral foot/ankle was examined for comparison and noted to be neurovascularly intact with no localized deformity, swelling, or tenderness.  Imaging Review All images taken were independently reviewed by me.  Assessment/Plan: The clinical and radiographic findings were reviewed and discussed at length with the patient.  The patient has right hallux rigidus.  We spoke at length about the natural course of these findings. We discussed nonoperative and operative treatment options in detail.  The risks and benefits were presented and reviewed. The risks due to implant failure/irritation, infection, stiffness, nerve/vessel/tendon injury, wound healing issues, failure of this surgery, need for further surgery, thromboembolic events, amputation, death among others were discussed. The patient acknowledged the explanation and agreed to proceed with the plan.  Netta Cedars  Orthopaedic Surgery [facility]

## 2023-04-09 NOTE — Progress Notes (Unsigned)
HISTORY AND PHYSICAL     CC:  follow up. Requesting Provider:  Camie Patience, FNP  HPI: This is a 67 y.o. female here for follow up for carotid artery stenosis.   She did have a carotid bruit prior to back surgery.  She had carotid duplex, which revealed 1-39% bilateral ICA stenosis.   Pt returns today for follow up.    Pt denies any amaurosis fugax, speech difficulties, weakness, numbness, paralysis or clumsiness or facial droop.   She states she is having right foot surgery tomorrow.  She states she is being referred to pulmonary for her shortness of breath.  She stopped smoking in November 2022.     The pt is not on a statin for cholesterol management. (Muscle aches) The pt is on a daily aspirin.   Other AC:  none The pt is on CCB, hydralazine for hypertension.   The pt is not on medication for diabetes Tobacco hx:  former  Pt does not have family hx of AAA.  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 CAD   (STRESS TEST NOV. 2011   Depression    DJD (degenerative joint disease)    JOINT PAIN   Dyspnea    "anytime but mostly on exertion and smoking"   Dysrhythmia 2017   SVT   Elevated liver enzymes    Fibromyalgia    Frequency of urination    GERD (gastroesophageal reflux disease)     W/ NEXIUM   Heart murmur    Hemorrhoids    High cholesterol    History of blood transfusion     "related to anemia" (04/03/2018)   History of carpal tunnel syndrome    Right   History of gastric ulcer 2004   Hyperlipidemia    Hypertension    IBS (irritable bowel syndrome)    Incisional pain s/p interstim implant 1st stage--- 12-07-11    left upper buttock-- pt states dressing clean dry and intact (on 12-08-11)   Influenza due to influenza A virus    Insomnia    Internal derangement of left knee    Iron deficiency anemia 2004   Lesion of liver    Leukocytosis    Lumbar pain    Nocturia    Non-productive cough    Numbness in both hands AT TIMES   OSA (obstructive sleep apnea)    "suppose to wear mask; I don't" (04/03/2018)   Osteoarthritis of left knee    PONV (postoperative nausea and vomiting)    Post laminectomy syndrome    Preinfarction syndrome    Supraventricular tachycardia    SVT (supraventricular tachycardia)    Tobacco user    Urge urinary incontinence     Past Surgical History:  Procedure Laterality Date   ABDOMINAL EXPOSURE N/A  01/26/2021   Procedure: ABDOMINAL EXPOSURE;  Surgeon: Larina Earthly, MD;  Location: [facility] OR;  Service: Vascular;  Laterality: N/A;   ANTERIOR CERVICAL DECOMP/DISCECTOMY FUSION  2008   C4 - T1 ("screws from 1st OR came out")   ANTERIOR CERVICAL DECOMP/DISCECTOMY FUSION  2002   C4 - 7   ANTERIOR LATERAL LUMBAR FUSION WITH PERCUTANEOUS SCREW 2 LEVEL Left 11/27/2018   Procedure: XLIF Lumbar 3-5, posterior spinal fusion L3-5;  Surgeon: Venita Lick, MD;  Location: [facility] OR;  Service: Orthopedics;  Laterality: Left;  5.5 hrs for entire procedure   ANTERIOR LUMBAR FUSION N/A 01/26/2021   Procedure: ANTERIOR LUMBAR FUSION LUMBAR FIVE-SACRAL ONE WITH POSTERIOR SPINAL FUSION INTERBODY (EXTENSION OF PREVIOUS FUSION);  Surgeon: Venita Lick, MD;  Location: [facility] OR;  Service: Orthopedics;  Laterality: N/A;  6hrs Dr. Arbie Cookey to do approach Tap Block with exparel   APPENDECTOMY  1982   BACK SURGERY     CARDIAC CATHETERIZATION  2003   CARDIAC  CATHETERIZATION N/A 08/23/2016   Procedure: Left Heart Cath and Coronary Angiography;  Surgeon: Orpah Cobb, MD;  Location: [facility] INVASIVE CV LAB;  Service: Cardiovascular;  Laterality: N/A;   CARPAL TUNNEL RELEASE Right    CATARACT EXTRACTION W/ INTRAOCULAR LENS  IMPLANT, BILATERAL  2016-2018   CHOLECYSTECTOMY OPEN  1982   COLONOSCOPY     CYSTO/ HOD/ BLADDER BX  2006   CYSTO/ HOD/ BLADDER BX/ FULGERATION  06-12-2011   EYE SURGERY     FINGER SURGERY Right 2001   "replaced worn cartilage on thumb w/tendons"   HYSTEROSCOPY WITH D & C  2005   INTERSTIM IMPLANT PLACEMENT  12/07/2011   Procedure: Leane Platt IMPLANT FIRST STAGE;  Surgeon: Martina Sinner, MD;  Location: [facility];  Service: Urology;  Laterality: Right;   INTERSTIM IMPLANT PLACEMENT  12/14/2011   Procedure: Leane Platt IMPLANT SECOND STAGE;  Surgeon: Martina Sinner, MD;  Location: [facility];  Service: Urology;  Laterality: Right;  rad tech ok by vickie at [facility]   JOINT REPLACEMENT     LUMBAR LAMINECTOMY  2007   L3 - 5   TONSILLECTOMY AND ADENOIDECTOMY  1965   TOTAL KNEE ARTHROPLASTY Right 02/10/2020   Procedure: TOTAL KNEE ARTHROPLASTY;  Surgeon: Durene Romans, MD;  Location: [facility] ORS;  Service: Orthopedics;  Laterality: Right;  70 mins   TOTAL KNEE ARTHROPLASTY Left 07/20/2020   Procedure: TOTAL KNEE ARTHROPLASTY;  Surgeon: Durene Romans, MD;  Location: [facility] ORS;  Service: Orthopedics;  Laterality: Left;   TUBAL LIGATION     UPPER GI ENDOSCOPY     WRIST SURGERY Left    TFCC ("tendon repair")    Allergies  Allergen Reactions   Midazolam Hcl Other (See Comments)    HYPER   Prednisone Other (See Comments)    HYPER, depressed, moody   Statins Other (See Comments)    Causes muscle weakness and joint pain   Ace Inhibitors Other (See Comments)    DECREASES HEARTRATE   Amoxicillin Diarrhea    Did it involve swelling of the face/tongue/throat, SOB, or low BP? No Did it involve sudden or severe  rash/hives, skin peeling, or any reaction on the inside of your mouth or nose? No Did you need to seek medical attention at a hospital or doctor's office? No When did it last happen?  ~2019 or so If all above answers are "NO", may proceed with cephalosporin use.    Nsaids Other (See Comments)    AVOIDS; HX GASTRIC ULCER  Sulfa Antibiotics Rash and Other (See Comments)    JOINT PAIN    Current Outpatient Medications  Medication Sig Dispense Refill   acetaminophen (TYLENOL) 500 MG tablet Take 1,000-1,500 mg by mouth daily as needed for headache (pain).     albuterol (VENTOLIN HFA) 108 (90 Base) MCG/ACT inhaler Inhale 2 puffs into the lungs every 6 (six) hours as needed for wheezing or shortness of breath. 18 g 2   alendronate (FOSAMAX) 70 MG tablet Take 70 mg by mouth every Thursday. Take with a full glass of water on an empty stomach.     aspirin EC 81 MG tablet Take 81 mg by mouth 2 (two) times daily. Swallow whole.     benzonatate (TESSALON) 100 MG capsule Take 1 capsule (100 mg total) by mouth every 8 (eight) hours as needed for cough. (Patient not taking: Reported on 03/21/2023) 21 capsule 0   cyclobenzaprine (FLEXERIL) 10 MG tablet Take 10 mg by mouth at bedtime as needed.     diltiazem (CARDIZEM CD) 120 MG 24 hr capsule TAKE 1 CAPSULE (120 MG TOTAL) BY MOUTH DAILY. PLEASE SCHEDULE APPT FOR FUTURE REFILLS. 1ST ATTEMPT (Patient taking differently: Take 120 mg by mouth every morning. Please schedule appt for future refills. 1st attempt) 90 capsule 3   DULoxetine (CYMBALTA) 60 MG capsule Take 60 mg by mouth at bedtime.     hydrALAZINE (APRESOLINE) 10 MG tablet Take 10 mg by mouth 2 (two) times daily.     hydrOXYzine (ATARAX) 50 MG tablet Take 25-50 mg by mouth 2 (two) times daily as needed.     ipratropium-albuterol (DUONEB) 0.5-2.5 (3) MG/3ML SOLN Take 3 mLs by nebulization every 6 (six) hours as needed. 360 mL 2   KLOR-CON M10 10 MEQ tablet Take 10 mEq by mouth daily.     MAGNESIUM PO Take  by mouth.     nitroGLYCERIN (NITROSTAT) 0.4 MG SL tablet Place 1 tablet (0.4 mg total) under the tongue every 5 (five) minutes x 3 doses as needed for chest pain. (Patient not taking: Reported on 09/18/2022) 10 tablet 0   pantoprazole (PROTONIX) 40 MG tablet Take 1 tablet (40 mg total) by mouth 2 (two) times daily. 60 tablet 0   No current facility-administered medications for this visit.    Family History  Problem Relation Age of Onset   Hypertension Father    Heart disease Father    Kidney failure Father    Breast cancer Mother    Hypertension Mother    Coronary artery disease Mother    Alzheimer's disease Mother    Hypertension Brother    Hypertension Sister    Hypertension Daughter     Social History   Socioeconomic History   Marital status: Widowed    Spouse name: Not on file   Number of children: Not on file   Years of education: Not on file   Highest education level: Not on file  Occupational History   Not on file  Tobacco Use   Smoking status: Every Day    Packs/day: 2.00    Years: 43.00    Additional pack years: 0.00    Total pack years: 86.00    Types: Cigarettes   Smokeless tobacco: Never  Vaping Use   Vaping Use: Never used  Substance and Sexual Activity   Alcohol use: Not Currently    Alcohol/week: 0.0 standard drinks of alcohol    Comment: 04/03/2018 "nothing in years"   Drug use: Not Currently    Types:  Marijuana   Sexual activity: Not on file  Other Topics Concern   Not on file  Social History Narrative   L[place] alone [place] a [place] with her 2 dogs.   On disability since 2009.  Previously worked as a Designer, industrial/product.    Education: high school   Social Determinants of Health   Financial Resource Strain: High Risk (08/15/2022)   Overall Financial Resource Strain (CARDIA)    Difficulty of Paying Living Expenses: Very hard  Food Insecurity: Food Insecurity Present (08/15/2022)   Hunger Vital Sign    Worried About  Running Out of Food in the Last Year: Often true    Ran Out of Food in the Last Year: Never true  Transportation Needs: No Transportation Needs (08/10/2022)   PRAPARE - Administrator, Civil Service (Medical): No    Lack of Transportation (Non-Medical): No  Physical Activity: Not on file  Stress: Not on file  Social Connections: Not on file  Intimate Partner Violence: Not on file     REVIEW OF SYSTEMS:    denotes positive finding,  denotes negative finding Cardiac  Comments:  Chest pain or chest pressure:    Shortness of breath upon exertion: x   Short of breath when lying flat:    Irregular heart rhythm:        Vascular    Pain in calf, thigh, or hip brought on by ambulation:    Pain in feet at night that wakes you up from your sleep:     Blood clot in your veins:    Leg swelling:         Pulmonary    Oxygen at home:    Productive cough:     Wheezing:         Neurologic    Sudden weakness in arms or legs:     Sudden numbness in arms or legs:     Sudden onset of difficulty speaking or slurred speech:    Temporary loss of vision in one eye:     Problems with dizziness:         Gastrointestinal    Blood in stool:     Vomited blood:         Genitourinary    Burning when urinating:     Blood in urine:        Psychiatric    Major depression:         Hematologic    Bleeding problems:    Problems with blood clotting too easily:        Skin    Rashes or ulcers:        Constitutional    Fever or chills:      PHYSICAL EXAMINATION:  Today's Vitals   04/10/23 1330 04/10/23 1332  BP: 129/63   Pulse: 60   Resp: 20   Temp: 97.7 F (36.5 C)   TempSrc: Temporal   SpO2: 98%   Weight: 170 lb 12.8 oz (77.5 kg)   Height:  (1.473 m)   PainSc: 5  5   PainLoc: Back    Body mass index is 35.7 kg/m.   General:  WDWN in NAD; vital signs documented above Gait: Not observed HENT: WNL, normocephalic Pulmonary: normal non-labored  breathing Cardiac: regular HR, with carotid bruit on the right Abdomen: soft, NT; aortic pulse is not palpable Skin: without rashes Vascular Exam/Pulses:  Right Left  Radial 2+ (normal)  2+ (normal)  PT 2+ (normal) 2+ (normal)   Extremities: without open wounds Musculoskeletal: no muscle wasting or atrophy  Neurologic: A&O X 3; moving all extremities equally; speech is fluent/normal Psychiatric:  The pt has Normal affect.   Non-Invasive Vascular Imaging:   Carotid Duplex on 04/10/2023 Right:  1-39% ICA stenosis Left:  1-39% ICA stenosis   Previous Carotid duplex on 03/18/2021: Right: 1-39% ICA stenosis Left:   1-39% ICA stenosis    ASSESSMENT/PLAN:: 67 y.o. female here for follow up carotid artery stenosis for carotid bruit heard several years ago  -duplex today reveals bilateral ICA stenosis in the 1-39% range.  The left is at the high end range.   -discussed s/s of stroke with pt and she understands should she develop any of these sx, she will go to the nearest ER or call 911. -pt will f/u in one year with carotid duplex -pt will call sooner should she have any issues. -continue asa.  She is currently off this for her surgery tomorrow.    Doreatha Massed, [facility] [phone]  Clinic MD:  Lenell Antu

## 2023-04-10 ENCOUNTER — Encounter: Payer: Self-pay | Admitting: Physician Assistant

## 2023-04-10 ENCOUNTER — Ambulatory Visit (INDEPENDENT_AMBULATORY_CARE_PROVIDER_SITE_OTHER): Payer: Medicare Other | Admitting: Physician Assistant

## 2023-04-10 ENCOUNTER — Ambulatory Visit (HOSPITAL_COMMUNITY)
Admission: RE | Admit: 2023-04-10 | Discharge: 2023-04-10 | Disposition: A | Discharge status: Home / Self Care | Payer: Medicare Other | Source: Ambulatory Visit | Attending: Vascular Surgery | Admitting: Vascular Surgery

## 2023-04-10 VITALS — BP 129/63 | HR 60 | Temp 97.7°F | Resp 20 | Ht <= 58 in | Wt 170.8 lb

## 2023-04-10 DIAGNOSIS — I6529 Occlusion and stenosis of unspecified carotid artery: Secondary | ICD-10-CM | POA: Insufficient documentation

## 2023-04-11 ENCOUNTER — Other Ambulatory Visit: Payer: Self-pay

## 2023-04-11 ENCOUNTER — Ambulatory Visit (HOSPITAL_BASED_OUTPATIENT_CLINIC_OR_DEPARTMENT_OTHER)
Admission: RE | Admit: 2023-04-11 | Discharge: 2023-04-11 | Disposition: A | Discharge status: Home / Self Care | Payer: Medicare Other | Attending: Orthopaedic Surgery | Admitting: Orthopaedic Surgery

## 2023-04-11 ENCOUNTER — Encounter (HOSPITAL_BASED_OUTPATIENT_CLINIC_OR_DEPARTMENT_OTHER): Payer: Self-pay | Admitting: Orthopaedic Surgery

## 2023-04-11 ENCOUNTER — Ambulatory Visit (HOSPITAL_BASED_OUTPATIENT_CLINIC_OR_DEPARTMENT_OTHER): Payer: Medicare Other | Admitting: Anesthesiology

## 2023-04-11 ENCOUNTER — Ambulatory Visit (HOSPITAL_COMMUNITY): Payer: Medicare Other

## 2023-04-11 ENCOUNTER — Encounter (HOSPITAL_BASED_OUTPATIENT_CLINIC_OR_DEPARTMENT_OTHER)
Admission: RE | Disposition: A | Discharge status: Home / Self Care | Payer: Self-pay | Source: Home / Self Care | Attending: Orthopaedic Surgery

## 2023-04-11 DIAGNOSIS — D649 Anemia, unspecified: Secondary | ICD-10-CM | POA: Insufficient documentation

## 2023-04-11 DIAGNOSIS — Z87891 Personal history of nicotine dependence: Secondary | ICD-10-CM | POA: Diagnosis not present

## 2023-04-11 DIAGNOSIS — Z8711 Personal history of peptic ulcer disease: Secondary | ICD-10-CM | POA: Insufficient documentation

## 2023-04-11 DIAGNOSIS — F4000 Agoraphobia, unspecified: Secondary | ICD-10-CM | POA: Diagnosis not present

## 2023-04-11 DIAGNOSIS — I110 Hypertensive heart disease with heart failure: Secondary | ICD-10-CM | POA: Insufficient documentation

## 2023-04-11 DIAGNOSIS — Z01818 Encounter for other preprocedural examination: Secondary | ICD-10-CM

## 2023-04-11 DIAGNOSIS — M2021 Hallux rigidus, right foot: Secondary | ICD-10-CM | POA: Diagnosis not present

## 2023-04-11 DIAGNOSIS — G629 Polyneuropathy, unspecified: Secondary | ICD-10-CM | POA: Diagnosis not present

## 2023-04-11 DIAGNOSIS — G4733 Obstructive sleep apnea (adult) (pediatric): Secondary | ICD-10-CM | POA: Diagnosis not present

## 2023-04-11 DIAGNOSIS — F17210 Nicotine dependence, cigarettes, uncomplicated: Secondary | ICD-10-CM

## 2023-04-11 DIAGNOSIS — D759 Disease of blood and blood-forming organs, unspecified: Secondary | ICD-10-CM | POA: Insufficient documentation

## 2023-04-11 DIAGNOSIS — I471 Supraventricular tachycardia, unspecified: Secondary | ICD-10-CM | POA: Diagnosis not present

## 2023-04-11 DIAGNOSIS — Z4789 Encounter for other orthopedic aftercare: Secondary | ICD-10-CM | POA: Diagnosis not present

## 2023-04-11 DIAGNOSIS — F17200 Nicotine dependence, unspecified, uncomplicated: Secondary | ICD-10-CM | POA: Diagnosis not present

## 2023-04-11 DIAGNOSIS — M797 Fibromyalgia: Secondary | ICD-10-CM | POA: Diagnosis not present

## 2023-04-11 DIAGNOSIS — M2011 Hallux valgus (acquired), right foot: Secondary | ICD-10-CM

## 2023-04-11 DIAGNOSIS — I2510 Atherosclerotic heart disease of native coronary artery without angina pectoris: Secondary | ICD-10-CM | POA: Diagnosis not present

## 2023-04-11 DIAGNOSIS — I509 Heart failure, unspecified: Secondary | ICD-10-CM

## 2023-04-11 DIAGNOSIS — G8918 Other acute postprocedural pain: Secondary | ICD-10-CM | POA: Diagnosis not present

## 2023-04-11 DIAGNOSIS — I5032 Chronic diastolic (congestive) heart failure: Secondary | ICD-10-CM | POA: Insufficient documentation

## 2023-04-11 HISTORY — PX: ARTHRODESIS METATARSALPHALANGEAL JOINT (MTPJ): SHX6566

## 2023-04-11 SURGERY — FUSION, JOINT, GREAT TOE
Anesthesia: Regional | Site: Toe | Laterality: Right

## 2023-04-11 MED ORDER — PHENYLEPHRINE HCL (PRESSORS) 10 MG/ML IV SOLN
INTRAVENOUS | Status: DC | PRN
  Administered 2023-04-11 (×2): 80 ug via INTRAVENOUS

## 2023-04-11 MED ORDER — DEXAMETHASONE SODIUM PHOSPHATE 10 MG/ML IJ SOLN
INTRAMUSCULAR | Status: DC | PRN
  Administered 2023-04-11: 5 mg via INTRAVENOUS

## 2023-04-11 MED ORDER — LIDOCAINE 2% (20 MG/ML) 5 ML SYRINGE
INTRAMUSCULAR | Status: AC
  Filled 2023-04-11: qty 5

## 2023-04-11 MED ORDER — CEFAZOLIN SODIUM-DEXTROSE 2-4 GM/100ML-% IV SOLN
INTRAVENOUS | Status: AC
  Filled 2023-04-11: qty 100

## 2023-04-11 MED ORDER — PROPOFOL 500 MG/50ML IV EMUL
INTRAVENOUS | Status: AC
  Filled 2023-04-11: qty 50

## 2023-04-11 MED ORDER — PROPOFOL 500 MG/50ML IV EMUL
INTRAVENOUS | Status: DC | PRN
  Administered 2023-04-11: 150 ug/kg/min via INTRAVENOUS

## 2023-04-11 MED ORDER — ACETAMINOPHEN 160 MG/5ML PO SOLN: 325.0000 mg | ORAL | Status: DC | PRN

## 2023-04-11 MED ORDER — VANCOMYCIN HCL 500 MG IV SOLR
INTRAVENOUS | Status: DC | PRN
  Administered 2023-04-11: 500 mg

## 2023-04-11 MED ORDER — LIDOCAINE HCL (CARDIAC) PF 100 MG/5ML IV SOSY
PREFILLED_SYRINGE | INTRAVENOUS | Status: DC | PRN
  Administered 2023-04-11: 20 mg via INTRATRACHEAL

## 2023-04-11 MED ORDER — FENTANYL CITRATE (PF) 100 MCG/2ML IJ SOLN
100.0000 ug | Freq: Once | INTRAMUSCULAR | Status: AC
  Administered 2023-04-11: 100 ug via INTRAVENOUS

## 2023-04-11 MED ORDER — ONDANSETRON HCL 4 MG/2ML IJ SOLN
INTRAMUSCULAR | Status: AC
  Filled 2023-04-11: qty 2

## 2023-04-11 MED ORDER — OXYCODONE HCL 5 MG/5ML PO SOLN: 5.0000 mg | Freq: Once | ORAL | Status: DC | PRN

## 2023-04-11 MED ORDER — CHLORHEXIDINE GLUCONATE 4 % EX SOLN: 60.0000 mL | Freq: Once | CUTANEOUS | Status: DC

## 2023-04-11 MED ORDER — FENTANYL CITRATE (PF) 100 MCG/2ML IJ SOLN
INTRAMUSCULAR | Status: AC
  Filled 2023-04-11: qty 2

## 2023-04-11 MED ORDER — BUPIVACAINE LIPOSOME 1.3 % IJ SUSP
INTRAMUSCULAR | Status: DC | PRN
  Administered 2023-04-11: 10 mL via PERINEURAL

## 2023-04-11 MED ORDER — ONDANSETRON HCL 4 MG/2ML IJ SOLN: 4.0000 mg | Freq: Once | INTRAMUSCULAR | Status: DC | PRN

## 2023-04-11 MED ORDER — FENTANYL CITRATE (PF) 100 MCG/2ML IJ SOLN: 25.0000 ug | INTRAMUSCULAR | Status: DC | PRN

## 2023-04-11 MED ORDER — MEPERIDINE HCL 25 MG/ML IJ SOLN: 6.2500 mg | INTRAMUSCULAR | Status: DC | PRN

## 2023-04-11 MED ORDER — BUPIVACAINE HCL (PF) 0.5 % IJ SOLN
INTRAMUSCULAR | Status: DC | PRN
  Administered 2023-04-11: 10 mL via PERINEURAL

## 2023-04-11 MED ORDER — OXYCODONE HCL 5 MG PO TABS: 5.0000 mg | ORAL_TABLET | Freq: Once | ORAL | Status: DC | PRN

## 2023-04-11 MED ORDER — VANCOMYCIN HCL 500 MG IV SOLR
INTRAVENOUS | Status: AC
  Filled 2023-04-11: qty 10

## 2023-04-11 MED ORDER — CEFAZOLIN SODIUM-DEXTROSE 2-4 GM/100ML-% IV SOLN
2.0000 g | INTRAVENOUS | Status: AC
  Administered 2023-04-11: 2 g via INTRAVENOUS

## 2023-04-11 MED ORDER — ROPIVACAINE HCL 5 MG/ML IJ SOLN
INTRAMUSCULAR | Status: DC | PRN
  Administered 2023-04-11: 20 mL via PERINEURAL

## 2023-04-11 MED ORDER — EPHEDRINE SULFATE (PRESSORS) 50 MG/ML IJ SOLN
INTRAMUSCULAR | Status: DC | PRN
  Administered 2023-04-11: 10 mg via INTRAVENOUS
  Administered 2023-04-11: 5 mg via INTRAVENOUS
  Administered 2023-04-11: 10 mg via INTRAVENOUS

## 2023-04-11 MED ORDER — EPHEDRINE 5 MG/ML INJ
INTRAVENOUS | Status: AC
  Filled 2023-04-11: qty 5

## 2023-04-11 MED ORDER — MIDAZOLAM HCL 2 MG/2ML IJ SOLN
INTRAMUSCULAR | Status: AC
  Filled 2023-04-11: qty 2

## 2023-04-11 MED ORDER — LACTATED RINGERS IV SOLN: INTRAVENOUS | Status: DC

## 2023-04-11 MED ORDER — PROPOFOL 10 MG/ML IV BOLUS
INTRAVENOUS | Status: DC | PRN
  Administered 2023-04-11: 40 mg via INTRAVENOUS
  Administered 2023-04-11: 140 mg via INTRAVENOUS
  Administered 2023-04-11: 20 mg via INTRAVENOUS
  Administered 2023-04-11: 60 mg via INTRAVENOUS
  Administered 2023-04-11: 50 mg via INTRAVENOUS

## 2023-04-11 MED ORDER — 0.9 % SODIUM CHLORIDE (POUR BTL) OPTIME
TOPICAL | Status: DC | PRN
  Administered 2023-04-11 (×2): 500 mL

## 2023-04-11 MED ORDER — ACETAMINOPHEN 325 MG PO TABS: 325.0000 mg | ORAL_TABLET | ORAL | Status: DC | PRN

## 2023-04-11 MED ORDER — BUPIVACAINE HCL (PF) 0.25 % IJ SOLN
INTRAMUSCULAR | Status: AC
  Filled 2023-04-11: qty 30

## 2023-04-11 MED ORDER — DEXAMETHASONE SODIUM PHOSPHATE 10 MG/ML IJ SOLN
INTRAMUSCULAR | Status: AC
  Filled 2023-04-11: qty 1

## 2023-04-11 SURGICAL SUPPLY — 82 items
APL PRP STRL LF DISP 70% ISPRP (MISCELLANEOUS) ×1
BANDAGE ESMARK 6X9 LF (GAUZE/BANDAGES/DRESSINGS) ×1 IMPLANT
BIT DRILL 2.5X60 (BIT) IMPLANT
BIT DRILL ANKLE 2.0X30 (BIT) IMPLANT
BLADE AVERAGE 25X9 (BLADE) IMPLANT
BLADE SURG 15 STRL LF DISP TIS (BLADE) ×2 IMPLANT
BLADE SURG 15 STRL SS (BLADE) ×4
BNDG CMPR 5X4 CHSV STRCH STRL (GAUZE/BANDAGES/DRESSINGS) ×1
BNDG CMPR 5X4 KNIT ELC UNQ LF (GAUZE/BANDAGES/DRESSINGS) ×1
BNDG CMPR 5X62 HK CLSR LF (GAUZE/BANDAGES/DRESSINGS) ×1
BNDG CMPR 6"X 5 YARDS HK CLSR (GAUZE/BANDAGES/DRESSINGS) ×1
BNDG CMPR 9X6 STRL LF SNTH (GAUZE/BANDAGES/DRESSINGS) ×1
BNDG COHESIVE 4X5 TAN STRL LF (GAUZE/BANDAGES/DRESSINGS) ×1 IMPLANT
BNDG ELASTIC 4INX 5YD STR LF (GAUZE/BANDAGES/DRESSINGS) ×1 IMPLANT
BNDG ELASTIC 6INX 5YD STR LF (GAUZE/BANDAGES/DRESSINGS) ×1 IMPLANT
BNDG ESMARK 6X9 LF (GAUZE/BANDAGES/DRESSINGS) ×1
BNDG GAUZE DERMACEA FLUFF 4 (GAUZE/BANDAGES/DRESSINGS) ×1 IMPLANT
BNDG GZE DERMACEA 4 6PLY (GAUZE/BANDAGES/DRESSINGS) ×1
CANISTER SUCT 1200ML W/VALVE (MISCELLANEOUS) ×1 IMPLANT
CHLORAPREP W/TINT 26 (MISCELLANEOUS) ×1 IMPLANT
COVER BACK TABLE 60X90IN (DRAPES) ×1 IMPLANT
CUFF TOURN SGL QUICK 34 (TOURNIQUET CUFF) ×1
CUFF TRNQT CYL 34X4.125X (TOURNIQUET CUFF) ×1 IMPLANT
DRAPE C-ARM 42X72 X-RAY (DRAPES) ×1 IMPLANT
DRAPE C-ARMOR (DRAPES) ×1 IMPLANT
DRAPE EXTREMITY T 121X128X90 (DISPOSABLE) ×1 IMPLANT
DRAPE IMP U-DRAPE 54X76 (DRAPES) ×1 IMPLANT
DRAPE U-SHAPE 47X51 STRL (DRAPES) ×1 IMPLANT
DRSG MEPITEL 4X7.2 (GAUZE/BANDAGES/DRESSINGS) ×1 IMPLANT
ELECT REM PT RETURN 9FT ADLT (ELECTROSURGICAL) ×1
ELECTRODE REM PT RTRN 9FT ADLT (ELECTROSURGICAL) ×1 IMPLANT
GAUZE PAD ABD 8X10 STRL (GAUZE/BANDAGES/DRESSINGS) IMPLANT
GAUZE SPONGE 4X4 12PLY STRL (GAUZE/BANDAGES/DRESSINGS) ×1 IMPLANT
GAUZE XEROFORM 1X8 LF (GAUZE/BANDAGES/DRESSINGS) ×1 IMPLANT
GLOVE BIOGEL PI IND STRL 8 (GLOVE) ×1 IMPLANT
GLOVE SURG SS PI 7.5 STRL IVOR (GLOVE) ×1 IMPLANT
GOWN STRL REUS W/ TWL LRG LVL3 (GOWN DISPOSABLE) ×2 IMPLANT
GOWN STRL REUS W/TWL LRG LVL3 (GOWN DISPOSABLE) ×2
NDL HYPO 22X1.5 SAFETY MO (MISCELLANEOUS) IMPLANT
NEEDLE HYPO 22X1.5 SAFETY MO (MISCELLANEOUS) IMPLANT
NS IRRIG 1000ML POUR BTL (IV SOLUTION) ×1 IMPLANT
PACK BASIN DAY SURGERY FS (CUSTOM PROCEDURE TRAY) ×1 IMPLANT
PACK INSTR DART FIRE (KITS) IMPLANT
PAD CAST 4YDX4 CTTN HI CHSV (CAST SUPPLIES) ×1 IMPLANT
PADDING CAST ABS COTTON 4X4 ST (CAST SUPPLIES) IMPLANT
PADDING CAST COTTON 4X4 STRL (CAST SUPPLIES) ×2
PADDING CAST COTTON 6X4 STRL (CAST SUPPLIES) IMPLANT
PENCIL SMOKE EVACUATOR (MISCELLANEOUS) ×1 IMPLANT
PIN TEMP (PIN) IMPLANT
PLATE MTP 0 DEG RT (Plate) IMPLANT
SCREW 3.5X 16 NON LOCK (Screw) IMPLANT
SCREW HEADED 34X4.0 (Screw) IMPLANT
SCREW LOCK 3.5X16 (Screw) ×3 IMPLANT
SCREW LOCK FT 16X3.5XST PA (Screw) IMPLANT
SCREW LOCK FT 3.5X14 (Screw) IMPLANT
SCREW NLCK FT 14X3.5XST LOPRFL (Screw) IMPLANT
SCREW NLCK FT 18X3.5XST LOPRFL (Screw) IMPLANT
SCREW NLCK FT 20X3.5XST LOPRFL (Screw) IMPLANT
SCREW NONLOCK 3.5X14 (Screw) ×1 IMPLANT
SCREW NONLOCK 3.5X18 (Screw) ×2 IMPLANT
SCREW NONLOCK 3.5X20 (Screw) ×1 IMPLANT
SHEET MEDIUM DRAPE 40X70 STRL (DRAPES) ×1 IMPLANT
SLEEVE SCD COMPRESS KNEE MED (STOCKING) ×1 IMPLANT
SPIKE FLUID TRANSFER (MISCELLANEOUS) IMPLANT
SPLINT PLASTER CAST FAST 5X30 (CAST SUPPLIES) ×20 IMPLANT
SPONGE T-LAP 18X18 ~~LOC~~+RFID (SPONGE) ×1 IMPLANT
STOCKINETTE 6  STRL (DRAPES) ×1
STOCKINETTE 6 STRL (DRAPES) ×1 IMPLANT
SUCTION FRAZIER HANDLE 10FR (MISCELLANEOUS) ×1
SUCTION TUBE FRAZIER 10FR DISP (MISCELLANEOUS) ×1 IMPLANT
SUT ETHILON 2 0 FS 18 (SUTURE) ×1 IMPLANT
SUT FIBERWIRE #2 38 T-5 BLUE (SUTURE)
SUT MNCRL AB 3-0 PS2 18 (SUTURE) IMPLANT
SUT VIC AB 2-0 SH 27 (SUTURE) ×1
SUT VIC AB 2-0 SH 27XBRD (SUTURE) ×1 IMPLANT
SUT VICRYL 0 SH 27 (SUTURE) IMPLANT
SUTURE FIBERWR #2 38 T-5 BLUE (SUTURE) IMPLANT
SYR BULB EAR ULCER 3OZ GRN STR (SYRINGE) ×1 IMPLANT
SYR CONTROL 10ML LL (SYRINGE) IMPLANT
TOWEL GREEN STERILE FF (TOWEL DISPOSABLE) ×2 IMPLANT
TUBE CONNECTING 20X1/4 (TUBING) ×1 IMPLANT
UNDERPAD 30X36 HEAVY ABSORB (UNDERPADS AND DIAPERS) ×1 IMPLANT

## 2023-04-11 NOTE — Anesthesia Postprocedure Evaluation (Signed)
Anesthesia Post Note  Patient: [name]  Procedure(s) Performed: right 1st metatarsalphalangeal joint fusion (Right: Toe)     Patient location during evaluation: PACU Anesthesia Type: Regional and General Level of consciousness: awake and alert Pain management: pain level controlled Vital Signs Assessment: post-procedure vital signs reviewed and stable Respiratory status: spontaneous breathing, nonlabored ventilation, respiratory function stable and patient connected to nasal cannula oxygen Cardiovascular status: blood pressure returned to baseline and stable Postop Assessment: no apparent nausea or vomiting Anesthetic complications: no   No notable events documented.  Last Vitals:  Vitals:   04/11/23 1355 04/11/23 1432  BP: (!) 131/52   Pulse: 63   Resp: 20   Temp: 36.5 C   SpO2: 92% 93%    Last Pain:  Vitals:   04/11/23 1355  TempSrc:   PainSc: 0-No pain                 Loana Salvaggio

## 2023-04-11 NOTE — Anesthesia Procedure Notes (Signed)
Procedure Name: LMA Insertion Date/Time: 04/11/2023 10:02 AM  Performed by: Thornell Mule, CRNAPre-anesthesia Checklist: Patient identified, Emergency Drugs available, Suction available and Patient being monitored Patient Re-evaluated:Patient Re-evaluated prior to induction Oxygen Delivery Method: Circle system utilized Preoxygenation: Pre-oxygenation with 100% oxygen Induction Type: IV induction LMA: LMA inserted LMA Size: 3.0 Number of attempts: 2 Placement Confirmation: positive ETCO2 Tube secured with: Tape Dental Injury: Teeth and Oropharynx as per pre-operative assessment

## 2023-04-11 NOTE — Progress Notes (Signed)
PT spoke at length with Dr Odis Hollingshead about procedure and aftercare instructions. Pt complained and MD gave pt option to cancel procedure today. Pt stated she would like to proceed with procedure and will follow strict instructions after.

## 2023-04-11 NOTE — H&P (Addendum)
H&P Update:  -History and Physical Reviewed  -Patient has been re-examined  -No change in the plan of care  -The risks and benefits were presented and reviewed. The risks due to hardware/suture failure and/or irritation, new/persistent infection, stiffness, nerve/vessel/tendon injury or rerupture of repaired tendon, nonunion/malunion, allograft usage, wound healing issues, development of arthritis, failure of this surgery, possibility of external fixation with delayed definitive surgery, need for further surgery, thromboembolic events, anesthesia/medical complications, amputation, death among others were discussed. The patient acknowledged the explanation, agreed to proceed with the plan and a consent was signed.  -She understands that there is a prolonged recovery involved including estimated 3 wk of NWB, then 3 wk Heel WB, then 4-6 wk WBAT in postop shoe as well as multiple postop visits. She understands and would like to proceed  [name]

## 2023-04-11 NOTE — Anesthesia Procedure Notes (Signed)
Anesthesia Regional Block: Popliteal block   Pre-Anesthetic Checklist: , timeout performed,  Correct Patient, Correct Site, Correct Laterality,  Correct Procedure, Correct Position, site marked,  Risks and benefits discussed,  Surgical consent,  Pre-op evaluation,  At surgeon's request and post-op pain management  Laterality: Right  Prep: chloraprep       Needles:  Injection technique: Single-shot  Needle Type: Echogenic Stimulator Needle     Needle Length: 5cm  Needle Gauge: 22     Additional Needles:   Procedures:, nerve stimulator,,, ultrasound used (permanent image in chart),,     Nerve Stimulator or Paresthesia:  Response: foot, 0.45 mA  Additional Responses:   Narrative:  Start time: 04/11/2023 9:40 AM End time: 04/11/2023 9:45 AM Injection made incrementally with aspirations every 5 mL.  Performed by: Personally  Anesthesiologist: Bethena Midget, MD  Additional Notes: Functioning IV was confirmed and monitors were applied.  A 50mm 22ga Arrow echogenic stimulator needle was used. Sterile prep and drape,hand hygiene and sterile gloves were used. Ultrasound guidance: relevant anatomy identified, needle position confirmed, local anesthetic spread visualized around nerve(s)., vascular puncture avoided.  Image printed for medical record. Negative aspiration and negative test dose prior to incremental administration of local anesthetic. The patient tolerated the procedure well.

## 2023-04-11 NOTE — Progress Notes (Signed)
Assisted Dr. Oddono with right, adductor canal, popliteal, ultrasound guided block. Side rails up, monitors on throughout procedure. See vital signs in flow sheet. Tolerated Procedure well. 

## 2023-04-11 NOTE — Anesthesia Procedure Notes (Signed)
Anesthesia Regional Block: Adductor canal block   Pre-Anesthetic Checklist: , timeout performed,  Correct Patient, Correct Site, Correct Laterality,  Correct Procedure, Correct Position, site marked,  Risks and benefits discussed,  Surgical consent,  Pre-op evaluation,  At surgeon's request and post-op pain management  Laterality: Right  Prep: chloraprep       Needles:  Injection technique: Single-shot  Needle Type: Echogenic Stimulator Needle     Needle Length: 5cm  Needle Gauge: 22     Additional Needles:   Procedures:,,,, ultrasound used (permanent image in chart),,    Narrative:  Start time: 04/11/2023 8:37 AM End time: 04/11/2023 8:42 AM Injection made incrementally with aspirations every 5 mL.  Performed by: Personally  Anesthesiologist: Bethena Midget, MD  Additional Notes: Functioning IV was confirmed and monitors were applied.  A 50mm 22ga Arrow echogenic stimulator needle was used. Sterile prep and drape,hand hygiene and sterile gloves were used. Ultrasound guidance: relevant anatomy identified, needle position confirmed, local anesthetic spread visualized around nerve(s)., vascular puncture avoided.  Image printed for medical record. Negative aspiration and negative test dose prior to incremental administration of local anesthetic. The patient tolerated the procedure well.

## 2023-04-11 NOTE — Anesthesia Preprocedure Evaluation (Addendum)
Anesthesia Evaluation  Patient identified by MRN, date of birth, ID band Patient awake    Reviewed: Allergy & Precautions, NPO status , Patient's Chart, lab work & pertinent test results  History of Anesthesia Complications (+) PONV and history of anesthetic complications  Airway Mallampati: II  TM Distance: >3 FB Neck ROM: Full    Dental no notable dental hx. (+) Teeth Intact, Chipped, Dental Advisory Given,    Pulmonary shortness of breath and with exertion, sleep apnea (non-compliant with CPAP) , COPD,  COPD inhaler, Current Smoker and Patient abstained from smoking., former smoker 86 pack year history >2ppd quit 2 years ago  Inhalers expired SOB with rest and exertion    Pulmonary exam normal breath sounds clear to auscultation       Cardiovascular hypertension, Pt. on medications and Pt. on home beta blockers + CAD (cath 2017 non-obstructive CAD) and +CHF (grade 1 diastolic dysfunction)  Normal cardiovascular exam+ dysrhythmias Supra Ventricular Tachycardia  Rhythm:Regular Rate:Normal  Last echo 2017: - Left ventricle: The cavity size was normal. There was mild    concentric hypertrophy. Systolic function was normal. The    estimated ejection fraction was in the range of 60% to 65%. Wall    motion was normal; there were no regional wall motion    abnormalities. Doppler parameters are consistent with abnormal    left ventricular relaxation (grade 1 diastolic dysfunction).  - Mitral valve: Calcified annulus.   Cath 2017:  Prox RCA lesion, 45 %stenosed.  Ost LAD to Prox LAD lesion, 25 %stenosed.  Mid LAD to Dist LAD lesion, 40 %stenosed.  Dist LAD lesion, 60 %stenosed.  Ost 1st Mrg lesion, 20 %stenosed.  The left ventricular systolic function is normal.  LV end diastolic pressure is normal.  Ost 2nd Mrg lesion, 90 %stenosed.   Life style modification. Medical treatment as OM 2 is relatively small size vessel.       Neuro/Psych  PSYCHIATRIC DISORDERS (agoraphobia) Anxiety Depression     Neuromuscular disease (peripheral neuropathy b/l hands)    GI/Hepatic Neg liver ROS,GERD  Medicated and Controlled,,Hx gastric ulcer- avoids NSAIDS   Endo/Other  Obesity BMI 31  Renal/GU negative Renal ROS Bladder dysfunction (urinary frequency, nocturia)      Musculoskeletal  (+) Arthritis , Osteoarthritis,  Fibromyalgia -Chronic LBP s/p stim, post-laminectomy syn Spinal stenosis with neurogenic claudication, spondylolisthesis S/p prior ant lumbar fusion   Abdominal   Peds  Hematology negative hematology ROS (+) Blood dyscrasia, anemia hct 49.7, plt 364   Anesthesia Other Findings   Reproductive/Obstetrics negative OB ROS                              Anesthesia Physical Anesthesia Plan  ASA: 3  Anesthesia Plan: General and Regional   Post-op Pain Management: GA combined w/ Regional for post-op pain and Regional block*, Minimal or no pain anticipated, Tylenol PO (pre-op)* and Celebrex PO (pre-op)*   Induction: Intravenous  PONV Risk Score and Plan: 3 and Ondansetron, Dexamethasone, Treatment may vary due to age or medical condition, Scopolamine patch - Pre-op, Metaclopromide and TIVA  Airway Management Planned: Oral ETT and LMA  Additional Equipment: None  Intra-op Plan:   Post-operative Plan: Extubation in OR  Informed Consent: I have reviewed the patients History and Physical, chart, labs and discussed the procedure including the risks, benefits and alternatives for the proposed anesthesia with the patient or authorized representative who has indicated his/her understanding and acceptance.  Dental advisory given  Plan Discussed with: CRNA and Anesthesiologist  Anesthesia Plan Comments: (  )        Anesthesia Quick Evaluation

## 2023-04-11 NOTE — OR Nursing (Signed)
Patient was source for blood exposure to staff member.Per hospital policy blood was drawn for exposure panel.  Lorretta Harp, RN

## 2023-04-11 NOTE — Transfer of Care (Signed)
Immediate Anesthesia Transfer of Care Note  Patient: [name]  Procedure(s) Performed: right 1st metatarsalphalangeal joint fusion (Right: Toe)  Patient Location: PACU  Anesthesia Type:GA combined with regional for post-op pain  Level of Consciousness: drowsy and patient cooperative  Airway & Oxygen Therapy: Patient Spontanous Breathing and Patient connected to face mask oxygen  Post-op Assessment: Report given to RN and Post -op Vital signs reviewed and stable  Post vital signs: Reviewed and stable  Last Vitals:  Vitals Value Taken Time  BP 143/63 04/11/23 1208  Temp    Pulse 57 04/11/23 1209  Resp 17 04/11/23 1209  SpO2 87 % 04/11/23 1209  Vitals shown include unvalidated device data.  Last Pain:  Vitals:   04/11/23 0815  TempSrc: Oral  PainSc: 0-No pain      Patients Stated Pain Goal: 5 (04/11/23 0815)  Complications: No notable events documented.

## 2023-04-12 NOTE — Op Note (Addendum)
04/11/2023  2:55 PM   PATIENT: [name]  67 y.o. 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   INDICATION FOR PROCEDURE: The patient presented with above diagnosis.  We discussed the diagnosis, alternative treatment options, risks and benefits of the above surgical intervention, as well as alternative non-operative treatments. All questions/concerns were addressed and the patient/family demonstrated appropriate understanding of the diagnosis, the procedure, the postoperative course, and overall prognosis. The patient wished to proceed with surgical intervention and signed an informed surgical consent as such, in each others presence prior to surgery.   PROCEDURE IN DETAIL: After preoperative consent was obtained and the correct operative site was identified, the patient was brought to the operating room supine on stretcher and transferred onto operating table. General anesthesia was induced. Preoperative antibiotics were administered. Surgical timeout was taken. The patient was then positioned supine with an ipsilateral hip bump. The operative lower extremity was prepped and draped in standard sterile fashion with a tourniquet around the thigh. The extremity was exsanguinated and the tourniquet was inflated to 275 mmHg.  A dorsal approach was made directly over the first metatarsophalangeal joint. This was carried down to the level of the extensor hallucis longus tendon. The capsule was incised medial to the  tendon, and the tendon was protected throughout the procedure. The capsule and surrounding ligaments were released in order to allow full exposure of the first MTP joint. The cartilage remaining on either articular surface of the joint was debrided using curette and rongeur. A guide wire was placed directly into the first metatarsal and appropriate sized reamer was used to prepare the surface. This guidewire was then used in the proximal phalanx, and that articular surface was reamed similarly. Both joint surfaces were perforated with a K wire in order to stimulate bone healing across the arthrodesis site. The medial eminence was resected with a saw blade.    A crossing guide wire was placed to secure the arthrodesis in appropriate position. This was verified clinically using foot plate as well as with fluoroscopy. A compression screw was implanted over this guidewire using cannulated drill technique achieving excellent compression across the arthrodesis site. We then selected an appropriate sized Stryker fusion plate and implanted this with a series of non-locking and locking screws. Footplate and fluoroscopy were used throughout to verify appropriate position of the fusion.   The surgical sites were thoroughly irrigated. The tourniquet was deflated and hemostasis achieved. The deep layers were closed using 2-0 vicryl. Betadine and vancomycin applied. The skin was closed without tension using 2-0 nylon suture.    The leg was cleaned with saline and sterile dressings with gauze were applied. A well padded bulky short leg splint was applied. The patient was awakened from anesthesia and transported to the recovery room in stable condition.    FOLLOW UP PLAN: -transfer to PACU, then home -strict NWB operative extremity, maximum elevation -maintain short leg splint until follow up -DVT ppx: Aspirin 81 mg twice daily while NWB -follow up as outpatient within 7-10  days for wound check with exchange of short  leg splint to short leg cast -sutures out in 2-3 weeks in outpatient office   RADIOGRAPHS: AP, lateral, oblique radiographs of the right foot were obtained intraoperatively. These showed interval 1st MTP fusion. All hardware is appropriately positioned and of the appropriate lengths. No other acute injuries are noted.   Netta Cedars [facility]

## 2023-04-12 NOTE — Addendum Note (Signed)
Addendum  created 04/12/23 0935 by Bethena Midget, MD   Clinical Note Signed, Intraprocedure Blocks edited, SmartForm saved

## 2023-04-17 DIAGNOSIS — Z4789 Encounter for other orthopedic aftercare: Secondary | ICD-10-CM | POA: Diagnosis not present

## 2023-04-18 ENCOUNTER — Encounter (HOSPITAL_BASED_OUTPATIENT_CLINIC_OR_DEPARTMENT_OTHER): Payer: Self-pay | Admitting: Orthopaedic Surgery

## 2023-05-08 ENCOUNTER — Encounter: Payer: Medicare Other | Admitting: Acute Care

## 2023-05-08 ENCOUNTER — Other Ambulatory Visit: Payer: Medicare Other

## 2023-05-18 DIAGNOSIS — M2011 Hallux valgus (acquired), right foot: Secondary | ICD-10-CM | POA: Diagnosis not present

## 2023-06-19 DIAGNOSIS — M2011 Hallux valgus (acquired), right foot: Secondary | ICD-10-CM | POA: Diagnosis not present

## 2023-07-06 DIAGNOSIS — F331 Major depressive disorder, recurrent, moderate: Secondary | ICD-10-CM | POA: Diagnosis not present

## 2023-07-06 DIAGNOSIS — M170 Bilateral primary osteoarthritis of knee: Secondary | ICD-10-CM | POA: Diagnosis not present

## 2023-07-06 DIAGNOSIS — I10 Essential (primary) hypertension: Secondary | ICD-10-CM | POA: Diagnosis not present

## 2023-07-06 DIAGNOSIS — M810 Age-related osteoporosis without current pathological fracture: Secondary | ICD-10-CM | POA: Diagnosis not present

## 2023-07-06 DIAGNOSIS — G4700 Insomnia, unspecified: Secondary | ICD-10-CM | POA: Diagnosis not present

## 2023-07-06 DIAGNOSIS — J449 Chronic obstructive pulmonary disease, unspecified: Secondary | ICD-10-CM | POA: Diagnosis not present

## 2023-07-06 DIAGNOSIS — E782 Mixed hyperlipidemia: Secondary | ICD-10-CM | POA: Diagnosis not present

## 2023-07-10 DIAGNOSIS — J4520 Mild intermittent asthma, uncomplicated: Secondary | ICD-10-CM | POA: Diagnosis not present

## 2023-07-10 DIAGNOSIS — M48061 Spinal stenosis, lumbar region without neurogenic claudication: Secondary | ICD-10-CM | POA: Diagnosis not present

## 2023-07-10 DIAGNOSIS — I10 Essential (primary) hypertension: Secondary | ICD-10-CM | POA: Diagnosis not present

## 2023-07-10 DIAGNOSIS — R072 Precordial pain: Secondary | ICD-10-CM | POA: Diagnosis not present

## 2023-07-11 DIAGNOSIS — Z1231 Encounter for screening mammogram for malignant neoplasm of breast: Secondary | ICD-10-CM | POA: Diagnosis not present

## 2023-07-23 DIAGNOSIS — J3489 Other specified disorders of nose and nasal sinuses: Secondary | ICD-10-CM | POA: Diagnosis not present

## 2023-07-23 DIAGNOSIS — R0989 Other specified symptoms and signs involving the circulatory and respiratory systems: Secondary | ICD-10-CM | POA: Diagnosis not present

## 2023-07-23 DIAGNOSIS — R52 Pain, unspecified: Secondary | ICD-10-CM | POA: Diagnosis not present

## 2023-07-23 DIAGNOSIS — J449 Chronic obstructive pulmonary disease, unspecified: Secondary | ICD-10-CM | POA: Diagnosis not present

## 2023-07-24 DIAGNOSIS — Z4889 Encounter for other specified surgical aftercare: Secondary | ICD-10-CM | POA: Diagnosis not present

## 2023-07-24 DIAGNOSIS — M2011 Hallux valgus (acquired), right foot: Secondary | ICD-10-CM | POA: Diagnosis not present

## 2023-08-10 ENCOUNTER — Encounter: Payer: Self-pay | Admitting: Physician Assistant

## 2023-08-10 ENCOUNTER — Ambulatory Visit (INDEPENDENT_AMBULATORY_CARE_PROVIDER_SITE_OTHER): Payer: Medicare Other | Admitting: Physician Assistant

## 2023-08-10 DIAGNOSIS — Z87891 Personal history of nicotine dependence: Secondary | ICD-10-CM

## 2023-08-10 NOTE — Progress Notes (Signed)
Virtual Visit via Telephone Note  I connected with [name] on 08/10/23 at 1000 by telephone and verified that I am speaking with the correct person using two identifiers.  Location: Patient: home Provider: working virtually from home   I discussed the limitations, risks, security and privacy concerns of performing an evaluation and management service by telephone and the availability of in person appointments. I also discussed with the patient that there may be a patient responsible charge related to this service. The patient expressed understanding and agreed to proceed.       Shared Decision Making Visit Lung Cancer Screening Program [phone])   Eligibility: Age 67 Pack Years Smoking History Calculation 67 (# packs/per year x # years smoked) Recent History of coughing up blood  no Unexplained weight loss? No ( >Than 15 pounds within the last 6 months ) Prior History Lung / other cancer No (Diagnosis within the last 5 years already requiring surveillance chest CT Scans). Smoking Status Former Smoker Former Smokers: Years since quit: 2 years  Quit Date: 2022  Visit Components: Discussion included one or more decision making aids? Yes Discussion included risk/benefits of screening. Yes Discussion included potential follow up diagnostic testing for abnormal scans. Yes Discussion included meaning and risk of over diagnosis. Yes Discussion included meaning and risk of False Positives. Yes Discussion included meaning of total radiation exposure. Yes  Counseling Included: Importance of adherence to annual lung cancer LDCT screening. Yes Impact of comorbidities on ability to participate in the program. Yes Ability and willingness to under diagnostic treatment. Yes  Smoking Cessation Counseling: Former Smokers:  Discussed the importance of maintaining cigarette abstinence. Yes Diagnosis Code: Personal History of Nicotine Dependence. O53.664 Information about tobacco  cessation classes and interventions provided to patient. Yes Written Order for Lung Cancer Screening with LDCT placed in Epic. Yes (CT Chest Lung Cancer Screening Low Dose W/O CM) [id number] Z12.2-Screening of respiratory organs Z87.891-Personal history of nicotine dependence    I spent 25 minutes of face to face time/virtual visit time  with the patient discussing the risks and benefits of lung cancer screening. We took the time to pause at intervals to allow for questions to be asked and answered to ensure understanding. We discussed that they had taken the single most powerful action possible to decrease their risk of developing lung cancer when they quit smoking. I counseled them to remain smoke free, and to contact the office if they ever had the desire to smoke again so that we can provide resources and tools to help support the effort to remain smoke free. We discussed the time and location of the scan, and they  will receive a call or letter with the results within  24-72 hours of receiving them. They have the office contact information in the event they have questions.   They verbalized understanding of all of the above and had no further questions.    I explained to the patient that there has been a high incidence of coronary artery disease noted on these exams. I explained that this is a non-gated exam therefore degree or severity cannot be determined. This patient is not on statin therapy. I have asked the patient to follow-up with their PCP regarding any incidental finding of coronary artery disease and management with diet or medication as they feel is clinically indicated. The patient verbalized understanding of the above and had no further questions.      Darcella Gasman Kimetha Trulson, PA-C

## 2023-08-10 NOTE — Patient Instructions (Signed)

## 2023-08-13 ENCOUNTER — Ambulatory Visit
Admission: RE | Admit: 2023-08-13 | Discharge: 2023-08-13 | Disposition: A | Discharge status: Home / Self Care | Payer: Medicare Other | Source: Ambulatory Visit | Attending: Acute Care | Admitting: Acute Care

## 2023-08-13 DIAGNOSIS — Z87891 Personal history of nicotine dependence: Secondary | ICD-10-CM

## 2023-08-13 DIAGNOSIS — Z122 Encounter for screening for malignant neoplasm of respiratory organs: Secondary | ICD-10-CM

## 2023-08-15 ENCOUNTER — Encounter (HOSPITAL_COMMUNITY): Payer: Self-pay

## 2023-08-15 ENCOUNTER — Emergency Department (HOSPITAL_COMMUNITY)
Admission: EM | Admit: 2023-08-15 | Discharge: 2023-08-15 | Disposition: A | Discharge status: Home / Self Care | Payer: Medicare Other | Attending: Emergency Medicine | Admitting: Emergency Medicine

## 2023-08-15 ENCOUNTER — Other Ambulatory Visit: Payer: Self-pay

## 2023-08-15 ENCOUNTER — Emergency Department (HOSPITAL_COMMUNITY): Payer: Medicare Other

## 2023-08-15 DIAGNOSIS — R059 Cough, unspecified: Secondary | ICD-10-CM | POA: Diagnosis not present

## 2023-08-15 DIAGNOSIS — R0602 Shortness of breath: Secondary | ICD-10-CM | POA: Diagnosis not present

## 2023-08-15 DIAGNOSIS — J441 Chronic obstructive pulmonary disease with (acute) exacerbation: Secondary | ICD-10-CM | POA: Insufficient documentation

## 2023-08-15 DIAGNOSIS — Z1152 Encounter for screening for COVID-19: Secondary | ICD-10-CM | POA: Diagnosis not present

## 2023-08-15 LAB — RESP PANEL BY RT-PCR (RSV, FLU A&B, COVID)  RVPGX2
Influenza A by PCR: NEGATIVE
Influenza B by PCR: NEGATIVE
Resp Syncytial Virus by PCR: NEGATIVE
SARS Coronavirus 2 by RT PCR: NEGATIVE

## 2023-08-15 MED ORDER — DEXAMETHASONE 4 MG PO TABS
10.0000 mg | ORAL_TABLET | Freq: Once | ORAL | Status: AC
  Administered 2023-08-15: 10 mg via ORAL
  Filled 2023-08-15: qty 1

## 2023-08-15 MED ORDER — IPRATROPIUM-ALBUTEROL 0.5-2.5 (3) MG/3ML IN SOLN: 3.0000 mL | Freq: Four times a day (QID) | RESPIRATORY_TRACT | 0 refills | Status: AC | PRN

## 2023-08-15 MED ORDER — AZITHROMYCIN 250 MG PO TABS
500.0000 mg | ORAL_TABLET | Freq: Once | ORAL | Status: AC
  Administered 2023-08-15: 500 mg via ORAL
  Filled 2023-08-15: qty 2

## 2023-08-15 MED ORDER — IPRATROPIUM-ALBUTEROL 0.5-2.5 (3) MG/3ML IN SOLN
3.0000 mL | RESPIRATORY_TRACT | Status: AC
  Administered 2023-08-15 (×2): 3 mL via RESPIRATORY_TRACT
  Filled 2023-08-15: qty 6

## 2023-08-15 MED ORDER — AZITHROMYCIN 250 MG PO TABS: 250.0000 mg | ORAL_TABLET | Freq: Every day | ORAL | 0 refills | Status: AC

## 2023-08-15 NOTE — ED Provider Notes (Signed)
[facility] AT [facility] Provider Note   CSN: [id number] Arrival date & time: 08/15/23  2017     History No chief complaint on file.   HPI [name] is a 67 y.o. female presenting for acute on chronic shortness of breath.  67 year old female history of COPD. Denies fevers or chills, nausea or vomiting, syncope or chest pain.  Otherwise ambulatory tolerating p.o. intake on arrival.  Patient's recorded medical, surgical, social, medication list and allergies were reviewed in the Snapshot window as part of the initial history.   Review of Systems   Review of Systems  Constitutional:  Positive for fatigue. Negative for chills and fever.  HENT:  Negative for ear pain and sore throat.   Eyes:  Negative for pain and visual disturbance.  Respiratory:  Positive for cough and shortness of breath.   Cardiovascular:  Negative for chest pain and palpitations.  Gastrointestinal:  Negative for abdominal pain and vomiting.  Genitourinary:  Negative for dysuria and hematuria.  Musculoskeletal:  Negative for arthralgias and back pain.  Skin:  Negative for color change and rash.  Neurological:  Negative for seizures and syncope.  All other systems reviewed and are negative.   Physical Exam Updated Vital Signs BP (!) 150/88   Pulse 93   Temp 98.7 F (37.1 C)   Resp 19   Ht 4\' 10"  (1.473 m)   Wt 76.3 kg   LMP 09/06/2005 Comment: tubal ligation  SpO2 91%   BMI 35.15 kg/m  Physical Exam Vitals and nursing note reviewed.  Constitutional:      General: She is not in acute distress.    Appearance: She is well-developed.  HENT:     Head: Normocephalic and atraumatic.  Eyes:     Conjunctiva/sclera: Conjunctivae normal.  Cardiovascular:     Rate and Rhythm: Normal rate and regular rhythm.     Heart sounds: No murmur heard. Pulmonary:     Effort: Pulmonary effort is normal. No respiratory distress.     Breath sounds: Normal breath sounds.   Abdominal:     General: There is no distension.     Palpations: Abdomen is soft.     Tenderness: There is no abdominal tenderness. There is no right CVA tenderness or left CVA tenderness.  Musculoskeletal:        General: No swelling or tenderness. Normal range of motion.     Cervical back: Neck supple.  Skin:    General: Skin is warm and dry.  Neurological:     General: No focal deficit present.     Mental Status: She is alert and oriented to person, place, and time. Mental status is at baseline.     Cranial Nerves: No cranial nerve deficit.      ED Course/ Medical Decision Making/ A&P    Procedures Procedures   Medications Ordered in ED Medications  ipratropium-albuterol (DUONEB) 0.5-2.5 (3) MG/3ML nebulizer solution 3 mL (3 mLs Nebulization Given 08/15/23 2210)  dexamethasone (DECADRON) tablet 10 mg (10 mg Oral Given 08/15/23 2207)  azithromycin (ZITHROMAX) tablet 500 mg (500 mg Oral Given 08/15/23 2207)   Medical Decision Making:   [name] is a 67 y.o. female with a history of COPD, who presented to the ED today with acute on chronic SOB. They are endorsing worsening of their baseline dyspnea over the past 48 hours. Their baseline is a 0L O2 requirement. At their baseline they are able to get around the neighborhood and they  are not able to at this time.   On my initial exam, the pt was SOB and tachypneic. Audible wheezing and grossly decreased breath sounds appreciated.  They are endorsing increased sputum production.    Reviewed and confirmed nursing documentation for past medical history, family history, social history.    Initial Assessment:   With the patient's presentation of SOB in the above setting, most likely diagnosis is COPD Exacerbation. Other diagnoses were considered including (but not limited to) CAP, PE, ACS, viral infection, PTX. These are considered less likely due to history of present illness and physical exam findings.   This is most consistent  with an acute life/limb threatening illness complicated by underlying chronic conditions.  Initial Plan:  Evaluation for ACS with EKG  Viral testing Evaluation for infectious versus intrathoracic abnormality with chest x-ray  Patient's Wells score is low and patient does not warrant further objective evaluation for PE based on consistency of presentation of alternative diagnosis.  Objective evaluation as below reviewed   Initial Study Results:   Laboratory  All laboratory results reviewed without evidence of clinically relevant pathology.     EKG EKG was reviewed independently. Rate, rhythm, axis, intervals all examined and without medically relevant abnormality. ST segments without concerns for elevations.    Radiology:  All images reviewed independently. Agree with radiology report at this time.   DG Chest Portable 1 View  Result Date: 08/15/2023 CLINICAL DATA:  Short of breath, cough, body aches EXAM: PORTABLE CHEST 1 VIEW COMPARISON:  03/05/2023 FINDINGS: Single frontal view of the chest demonstrates a stable cardiac silhouette. No acute airspace disease, effusion, or pneumothorax. No acute bony abnormalities. IMPRESSION: 1. No acute intrathoracic process. Electronically Signed   By: Sharlet Salina M.D.   On: 08/15/2023 22:27    Final Assessment and Plan:   After initiation of medical therapies, patient is grossly improved and no longer in acute distress.   Reassessed at bedside and observed for 2 hours after administration of bronchodilators.  Patient stable satting well on room air in no acute distress without tachypnea.  She is stable for outpatient care management with supportive care, azithromycin, steroid burst and PCP follow-up in 72 hours.  Disposition:  I have considered need for hospitalization, however, considering all of the above, I believe this patient is stable for discharge at this time.  Patient/family educated about specific return precautions for given chief  complaint and symptoms.  Patient/family educated about follow-up with PCP.     Patient/family expressed understanding of return precautions and need for follow-up. Patient spoken to regarding all imaging and laboratory results and appropriate follow up for these results. All education provided in verbal form with additional information in written form. Time was allowed for answering of patient questions. Patient discharged.    Emergency Department Medication Summary:   Medications  ipratropium-albuterol (DUONEB) 0.5-2.5 (3) MG/3ML nebulizer solution 3 mL (3 mLs Nebulization Given 08/15/23 2210)  dexamethasone (DECADRON) tablet 10 mg (10 mg Oral Given 08/15/23 2207)  azithromycin (ZITHROMAX) tablet 500 mg (500 mg Oral Given 08/15/23 2207)       .    Clinical Impression:  1. COPD exacerbation (HCC)      Data Unavailable    Clinical Impression:  1. COPD exacerbation (HCC)      Data Unavailable   Final Clinical Impression(s) / ED Diagnoses Final diagnoses:  COPD exacerbation (HCC)    Rx / DC Orders ED Discharge Orders          Ordered  ipratropium-albuterol (DUONEB) 0.5-2.5 (3) MG/3ML SOLN  Every 6 hours PRN        08/15/23 2245    azithromycin (ZITHROMAX Z-PAK) 250 MG tablet  Daily        08/15/23 2245              Glyn Ade, MD 08/15/23 2314

## 2023-08-15 NOTE — ED Triage Notes (Signed)
Pt states that she feels like she has covid or the flu. Pt reports body aches, cough, and headache since yesterday.

## 2023-08-15 NOTE — ED Notes (Signed)
Discharge instructions reviewed with patient. Pharmacy verified with prescriptions discussed.   Opportunity for questions and concerns provided.   Pt alert, oriented, and ambulatory. Displays no signs of distress.

## 2023-08-16 DIAGNOSIS — J441 Chronic obstructive pulmonary disease with (acute) exacerbation: Secondary | ICD-10-CM | POA: Diagnosis not present

## 2023-08-21 ENCOUNTER — Other Ambulatory Visit: Payer: Self-pay | Admitting: Acute Care

## 2023-08-21 DIAGNOSIS — I10 Essential (primary) hypertension: Secondary | ICD-10-CM | POA: Diagnosis not present

## 2023-08-21 DIAGNOSIS — R5383 Other fatigue: Secondary | ICD-10-CM | POA: Diagnosis not present

## 2023-08-21 DIAGNOSIS — Z87891 Personal history of nicotine dependence: Secondary | ICD-10-CM

## 2023-08-21 DIAGNOSIS — D72828 Other elevated white blood cell count: Secondary | ICD-10-CM | POA: Diagnosis not present

## 2023-08-21 DIAGNOSIS — J438 Other emphysema: Secondary | ICD-10-CM | POA: Diagnosis not present

## 2023-08-21 DIAGNOSIS — Z122 Encounter for screening for malignant neoplasm of respiratory organs: Secondary | ICD-10-CM

## 2023-08-22 ENCOUNTER — Other Ambulatory Visit: Payer: Self-pay | Admitting: Family Medicine

## 2023-08-22 ENCOUNTER — Ambulatory Visit: Admission: RE | Admit: 2023-08-22 | Payer: Medicare Other | Source: Ambulatory Visit

## 2023-08-22 DIAGNOSIS — R0602 Shortness of breath: Secondary | ICD-10-CM | POA: Diagnosis not present

## 2023-08-22 DIAGNOSIS — J438 Other emphysema: Secondary | ICD-10-CM

## 2023-08-27 DIAGNOSIS — K0889 Other specified disorders of teeth and supporting structures: Secondary | ICD-10-CM | POA: Diagnosis not present

## 2023-08-27 DIAGNOSIS — M545 Low back pain, unspecified: Secondary | ICD-10-CM | POA: Diagnosis not present

## 2023-08-27 DIAGNOSIS — G8929 Other chronic pain: Secondary | ICD-10-CM | POA: Diagnosis not present

## 2023-08-27 DIAGNOSIS — D72829 Elevated white blood cell count, unspecified: Secondary | ICD-10-CM | POA: Diagnosis not present

## 2023-08-27 DIAGNOSIS — H538 Other visual disturbances: Secondary | ICD-10-CM | POA: Diagnosis not present

## 2023-08-27 DIAGNOSIS — R030 Elevated blood-pressure reading, without diagnosis of hypertension: Secondary | ICD-10-CM | POA: Diagnosis not present

## 2023-08-28 DIAGNOSIS — H35373 Puckering of macula, bilateral: Secondary | ICD-10-CM | POA: Diagnosis not present

## 2023-09-07 ENCOUNTER — Other Ambulatory Visit: Payer: Self-pay | Admitting: Medical

## 2023-09-07 ENCOUNTER — Ambulatory Visit
Admission: RE | Admit: 2023-09-07 | Discharge: 2023-09-07 | Disposition: A | Discharge status: Home / Self Care | Payer: Medicare Other | Source: Ambulatory Visit | Attending: Medical

## 2023-09-07 DIAGNOSIS — M25552 Pain in left hip: Secondary | ICD-10-CM

## 2023-09-07 DIAGNOSIS — M1612 Unilateral primary osteoarthritis, left hip: Secondary | ICD-10-CM | POA: Diagnosis not present

## 2023-09-10 ENCOUNTER — Other Ambulatory Visit: Payer: Self-pay | Admitting: Orthopedic Surgery

## 2023-09-10 DIAGNOSIS — M5451 Vertebrogenic low back pain: Secondary | ICD-10-CM | POA: Diagnosis not present

## 2023-09-21 ENCOUNTER — Inpatient Hospital Stay: Payer: Medicare Other

## 2023-09-21 ENCOUNTER — Inpatient Hospital Stay (HOSPITAL_BASED_OUTPATIENT_CLINIC_OR_DEPARTMENT_OTHER): Payer: Medicare Other | Admitting: Oncology

## 2023-09-21 ENCOUNTER — Inpatient Hospital Stay: Payer: Medicare Other | Attending: Oncology

## 2023-09-21 VITALS — BP 141/65 | HR 66 | Temp 98.1°F | Resp 18 | Ht <= 58 in | Wt 169.0 lb

## 2023-09-21 DIAGNOSIS — I10 Essential (primary) hypertension: Secondary | ICD-10-CM | POA: Insufficient documentation

## 2023-09-21 DIAGNOSIS — D649 Anemia, unspecified: Secondary | ICD-10-CM

## 2023-09-21 DIAGNOSIS — Z23 Encounter for immunization: Secondary | ICD-10-CM | POA: Insufficient documentation

## 2023-09-21 DIAGNOSIS — I2510 Atherosclerotic heart disease of native coronary artery without angina pectoris: Secondary | ICD-10-CM | POA: Insufficient documentation

## 2023-09-21 DIAGNOSIS — Z79899 Other long term (current) drug therapy: Secondary | ICD-10-CM | POA: Insufficient documentation

## 2023-09-21 DIAGNOSIS — D72829 Elevated white blood cell count, unspecified: Secondary | ICD-10-CM | POA: Diagnosis not present

## 2023-09-21 DIAGNOSIS — K760 Fatty (change of) liver, not elsewhere classified: Secondary | ICD-10-CM | POA: Insufficient documentation

## 2023-09-21 DIAGNOSIS — J449 Chronic obstructive pulmonary disease, unspecified: Secondary | ICD-10-CM | POA: Diagnosis not present

## 2023-09-21 DIAGNOSIS — Z87891 Personal history of nicotine dependence: Secondary | ICD-10-CM | POA: Diagnosis not present

## 2023-09-21 DIAGNOSIS — D1803 Hemangioma of intra-abdominal structures: Secondary | ICD-10-CM | POA: Diagnosis not present

## 2023-09-21 DIAGNOSIS — D509 Iron deficiency anemia, unspecified: Secondary | ICD-10-CM | POA: Insufficient documentation

## 2023-09-21 LAB — CBC WITH DIFFERENTIAL (CANCER CENTER ONLY)
Abs Immature Granulocytes: 0.09 10*3/uL — ABNORMAL HIGH (ref 0.00–0.07)
Basophils Absolute: 0.2 10*3/uL — ABNORMAL HIGH (ref 0.0–0.1)
Basophils Relative: 1 %
Eosinophils Absolute: 0.5 10*3/uL (ref 0.0–0.5)
Eosinophils Relative: 3 %
HCT: 35.1 % — ABNORMAL LOW (ref 36.0–46.0)
Hemoglobin: 10.4 g/dL — ABNORMAL LOW (ref 12.0–15.0)
Immature Granulocytes: 1 %
Lymphocytes Relative: 14 %
Lymphs Abs: 2.7 10*3/uL (ref 0.7–4.0)
MCH: 23.2 pg — ABNORMAL LOW (ref 26.0–34.0)
MCHC: 29.6 g/dL — ABNORMAL LOW (ref 30.0–36.0)
MCV: 78.3 fL — ABNORMAL LOW (ref 80.0–100.0)
Monocytes Absolute: 1.1 10*3/uL — ABNORMAL HIGH (ref 0.1–1.0)
Monocytes Relative: 6 %
Neutro Abs: 14.7 10*3/uL — ABNORMAL HIGH (ref 1.7–7.7)
Neutrophils Relative %: 75 %
Platelet Count: 379 10*3/uL (ref 150–400)
RBC: 4.48 MIL/uL (ref 3.87–5.11)
RDW: 15.9 % — ABNORMAL HIGH (ref 11.5–15.5)
WBC Count: 19.3 10*3/uL — ABNORMAL HIGH (ref 4.0–10.5)
nRBC: 0 % (ref 0.0–0.2)

## 2023-09-21 LAB — IRON AND TIBC
Iron: 24 ug/dL — ABNORMAL LOW (ref 28–170)
Saturation Ratios: 4 % — ABNORMAL LOW (ref 10.4–31.8)
TIBC: 554 ug/dL — ABNORMAL HIGH (ref 250–450)
UIBC: 530 ug/dL

## 2023-09-21 LAB — CMP (CANCER CENTER ONLY)
ALT: 40 U/L (ref 0–44)
AST: 45 U/L — ABNORMAL HIGH (ref 15–41)
Albumin: 4.3 g/dL (ref 3.5–5.0)
Alkaline Phosphatase: 106 U/L (ref 38–126)
Anion gap: 8 (ref 5–15)
BUN: 24 mg/dL — ABNORMAL HIGH (ref 8–23)
CO2: 29 mmol/L (ref 22–32)
Calcium: 9.6 mg/dL (ref 8.9–10.3)
Chloride: 100 mmol/L (ref 98–111)
Creatinine: 1.01 mg/dL — ABNORMAL HIGH (ref 0.44–1.00)
GFR, Estimated: >60 mL/min (ref >60)
Glucose, Bld: 121 mg/dL — ABNORMAL HIGH (ref 70–99)
Potassium: 4.5 mmol/L (ref 3.5–5.1)
Sodium: 137 mmol/L (ref 135–145)
Total Bilirubin: 0.5 mg/dL (ref 0.3–1.2)
Total Protein: 7.7 g/dL (ref 6.5–8.1)

## 2023-09-21 LAB — MAGNESIUM: Magnesium: 2.2 mg/dL (ref 1.7–2.4)

## 2023-09-21 LAB — FERRITIN: Ferritin: 21 ng/mL (ref 11–307)

## 2023-09-21 MED ORDER — INFLUENZA VAC A&B SURF ANT ADJ 0.5 ML IM SUSY
0.5000 mL | PREFILLED_SYRINGE | Freq: Once | INTRAMUSCULAR | Status: AC
  Administered 2023-09-21: 0.5 mL via INTRAMUSCULAR
  Filled 2023-09-21: qty 0.5

## 2023-09-21 NOTE — Progress Notes (Signed)
  [facility] OFFICE PROGRESS NOTE   Diagnosis: Leukocytosis  INTERVAL HISTORY:   [name] returns as scheduled.  She had a recent sinus infection.  She has night sweats at the upper body.  No fever.  No difficulty with bowel function.  Good appetite.  She occasionally "chokes "when eating.  She complains of joint and muscle pain.  Objective:  Vital signs in last 24 hours:  Blood pressure (!) 141/65, pulse 66, temperature 98.1 F (36.7 C), temperature source Temporal, resp. rate 18, height 4\' 10"  (1.473 m), weight 169 lb (76.7 kg), last menstrual period 09/06/2005, SpO2 96%.   Lymphatics: No cervical, supraclavicular, axillary, or inguinal nodes Resp: Lungs clear bilaterally Cardio: Regular rate and rhythm GI: Protuberant, no mass, no hepatosplenomegaly Vascular: No leg edema   Lab Results:  Lab Results  Component Value Date   WBC 19.3 (H) 09/21/2023   HGB 10.4 (L) 09/21/2023   HCT 35.1 (L) 09/21/2023   MCV 78.3 (L) 09/21/2023   PLT 379 09/21/2023   NEUTROABS 14.7 (H) 09/21/2023    CMP  Lab Results  Component Value Date   NA 140 05/01/2022   K 3.5 05/01/2022   CL 98 05/01/2022   CO2 29 05/01/2022   GLUCOSE 175 (H) 05/01/2022   BUN 14 05/01/2022   CREATININE 0.94 05/01/2022   CALCIUM 9.6 05/01/2022   PROT 7.2 05/01/2022   ALBUMIN 3.8 05/01/2022   AST 39 05/01/2022   ALT 26 05/01/2022   ALKPHOS 112 05/01/2022   BILITOT 0.5 05/01/2022   GFRNONAA >60 05/01/2022   GFRAA >60 07/21/2020    Medications: I have reviewed the patient's current medications.   Assessment/Plan: Leukocytosis 05/27/2015-BCR/ABL not detected, negative for JAK2 V617F mutation 09/15/2015 bone marrow biopsy-normocellular bone marrow with trilineage hematopoiesis, no definite morphologic evidence of a myeloproliferative or myelodysplastic process, cytogenetic analysis unremarkable 05/01/2022 advanced NGS JAK2, MPL and CALR normal Hepatomegaly on exam 05/01/2022 05/03/2022 CT  abdomen/pelvis-no acute findings.  Stable mild hepatomegaly and diffuse hepatic steatosis.  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  The etiology of the leukocytosis is unclear.  She has undergone an extensive negative diagnostic evaluation for a myeloproliferative disorder.  She has microcytic anemia today.  She will return to the lab for iron studies and a chemistry panel.  We will check stool Hemoccult cards and refer her to gastroenterology if she has iron deficiency.  She will return for an office visit and CBC in 2 months.  She received an influenza vaccine today.  Thornton Papas, MD  09/21/2023  12:26 PM

## 2023-09-25 ENCOUNTER — Telehealth: Payer: Self-pay | Admitting: *Deleted

## 2023-09-25 DIAGNOSIS — D649 Anemia, unspecified: Secondary | ICD-10-CM

## 2023-09-25 MED ORDER — FERROUS SULFATE 325 (65 FE) MG PO TBEC
325.0000 mg | DELAYED_RELEASE_TABLET | Freq: Two times a day (BID) | ORAL | Status: AC
Start: 2023-09-25 — End: ?

## 2023-09-25 NOTE — Telephone Encounter (Signed)
[name] returned call and made aware that her lab studies indicate she has iron deficiency anemia. She needs to pick up stool cards for testing this week, start ferrous sulfate 325 mg bid (add stool softener if it causes constipation). Will repeat iron studies at next visit. Will also need to see GI for possible colonoscopy. She acknowledges she is probably due and says she used to go to [facility].  Referral will be made there. She last saw Dr. Evette Cristal in 2021, so it will require a new referral.

## 2023-09-25 NOTE — Telephone Encounter (Signed)
Left vm for patient to call office to review lab results and next steps per Dr. Truett Perna.

## 2023-10-01 ENCOUNTER — Ambulatory Visit
Admission: RE | Admit: 2023-10-01 | Discharge: 2023-10-01 | Disposition: A | Discharge status: Home / Self Care | Payer: Medicare Other | Source: Ambulatory Visit | Attending: Orthopedic Surgery | Admitting: Orthopedic Surgery

## 2023-10-01 DIAGNOSIS — M5451 Vertebrogenic low back pain: Secondary | ICD-10-CM

## 2023-10-09 DIAGNOSIS — M48061 Spinal stenosis, lumbar region without neurogenic claudication: Secondary | ICD-10-CM | POA: Diagnosis not present

## 2023-10-09 DIAGNOSIS — R072 Precordial pain: Secondary | ICD-10-CM | POA: Diagnosis not present

## 2023-10-09 DIAGNOSIS — J449 Chronic obstructive pulmonary disease, unspecified: Secondary | ICD-10-CM | POA: Diagnosis not present

## 2023-10-09 DIAGNOSIS — I10 Essential (primary) hypertension: Secondary | ICD-10-CM | POA: Diagnosis not present

## 2023-10-30 DIAGNOSIS — M2011 Hallux valgus (acquired), right foot: Secondary | ICD-10-CM | POA: Diagnosis not present

## 2023-10-31 DIAGNOSIS — G4733 Obstructive sleep apnea (adult) (pediatric): Secondary | ICD-10-CM | POA: Diagnosis not present

## 2023-10-31 DIAGNOSIS — J449 Chronic obstructive pulmonary disease, unspecified: Secondary | ICD-10-CM | POA: Diagnosis not present

## 2023-10-31 DIAGNOSIS — E559 Vitamin D deficiency, unspecified: Secondary | ICD-10-CM | POA: Diagnosis not present

## 2023-10-31 DIAGNOSIS — I10 Essential (primary) hypertension: Secondary | ICD-10-CM | POA: Diagnosis not present

## 2023-10-31 DIAGNOSIS — R7303 Prediabetes: Secondary | ICD-10-CM | POA: Diagnosis not present

## 2023-10-31 DIAGNOSIS — E782 Mixed hyperlipidemia: Secondary | ICD-10-CM | POA: Diagnosis not present

## 2023-11-09 ENCOUNTER — Ambulatory Visit (HOSPITAL_BASED_OUTPATIENT_CLINIC_OR_DEPARTMENT_OTHER): Payer: Medicare Other | Admitting: Physical Therapy

## 2023-11-21 ENCOUNTER — Ambulatory Visit (HOSPITAL_BASED_OUTPATIENT_CLINIC_OR_DEPARTMENT_OTHER): Payer: Medicare Other | Attending: Orthopedic Surgery | Admitting: Physical Therapy

## 2023-11-21 ENCOUNTER — Encounter (HOSPITAL_BASED_OUTPATIENT_CLINIC_OR_DEPARTMENT_OTHER): Payer: Self-pay | Admitting: Physical Therapy

## 2023-11-21 ENCOUNTER — Other Ambulatory Visit: Payer: Self-pay

## 2023-11-21 DIAGNOSIS — M6281 Muscle weakness (generalized): Secondary | ICD-10-CM | POA: Diagnosis not present

## 2023-11-21 DIAGNOSIS — R262 Difficulty in walking, not elsewhere classified: Secondary | ICD-10-CM | POA: Diagnosis not present

## 2023-11-21 DIAGNOSIS — M5451 Vertebrogenic low back pain: Secondary | ICD-10-CM | POA: Diagnosis not present

## 2023-11-21 DIAGNOSIS — M5459 Other low back pain: Secondary | ICD-10-CM

## 2023-11-21 NOTE — Therapy (Signed)
OUTPATIENT PHYSICAL THERAPY THORACOLUMBAR EVALUATION   Patient Name: [name] MRN: [id number] DOB:[birth year], 67 y.o., female Today's Date: 11/21/2023  END OF SESSION:  PT End of Session - 11/21/23 1308     Visit Number 1    Number of Visits 16    Date for PT Re-Evaluation 01/18/24    Authorization Type mcr    PT Start Time 1203    PT Stop Time 1240    PT Time Calculation (min) 37 min    Activity Tolerance Patient limited by pain    Behavior During Therapy [facility] for tasks assessed/performed             Past Medical History:  Diagnosis Date   Acute coronary syndrome (HCC)    Acute exacerbation of chronic obstructive airways disease (HCC)    Acute renal failure syndrome (HCC)    Agoraphobia without history of panic disorder    Anginal pain (HCC)    Anxiety    Arthritis    "qwhere" (04/03/2018)   Bilateral carpal tunnel syndrome 02/18/2016   Bilateral knee pain    Carpal tunnel syndrome of left wrist    Chest pain    Childhood asthma    Chronic bronchitis (HCC)    Chronic lower back pain    Colitis presumed infectious    COPD (chronic obstructive pulmonary disease) (HCC)    Coronary artery disease CARDIOLOGIST- DR  Algie Coffer  (VISIT 03-30-11 W/ CHART)   NON-OBS. CAD   (STRESS TEST NOV. 2011   Depression    DJD (degenerative joint disease)    JOINT PAIN   Dyspnea    "anytime but mostly on exertion and smoking"   Dysrhythmia 2017   SVT   Elevated liver enzymes    Fibromyalgia    Frequency of urination    GERD (gastroesophageal reflux disease)     W/ NEXIUM   Heart murmur    Hemorrhoids    High cholesterol    History of blood transfusion    "related to anemia" (04/03/2018)   History of carpal tunnel syndrome    Right   History of gastric ulcer 2004   Hyperlipidemia    Hypertension    IBS (irritable bowel syndrome)    Incisional pain s/p interstim implant 1st stage--- 12-07-11    left upper buttock-- pt states dressing clean dry and intact (on 12-08-11)    Influenza due to influenza A virus    Insomnia    Internal derangement of left knee    Iron deficiency anemia 2004   Lesion of liver    Leukocytosis    Lumbar pain    Nocturia    Non-productive cough    Numbness in both hands AT TIMES   OSA (obstructive sleep apnea)    "suppose to wear mask; I don't" (04/03/2018)   Osteoarthritis of left knee    PONV (postoperative nausea and vomiting)    Post laminectomy syndrome    Preinfarction syndrome (HCC)    Supraventricular tachycardia (HCC)    SVT (supraventricular tachycardia) (HCC)    Tobacco user    Urge urinary incontinence    Past Surgical History:  Procedure Laterality Date   ABDOMINAL EXPOSURE N/A 01/26/2021   Procedure: ABDOMINAL EXPOSURE;  Surgeon: Larina Earthly, MD;  Location: [facility] OR;  Service: Vascular;  Laterality: N/A;   ANTERIOR CERVICAL DECOMP/DISCECTOMY FUSION  2008   C4 - T1 ("screws from 1st OR came out")   ANTERIOR CERVICAL DECOMP/DISCECTOMY FUSION  2002   C4 -  7   ANTERIOR LATERAL LUMBAR FUSION WITH PERCUTANEOUS SCREW 2 LEVEL Left 11/27/2018   Procedure: XLIF Lumbar 3-5, posterior spinal fusion L3-5;  Surgeon: Venita Lick, MD;  Location: [facility] OR;  Service: Orthopedics;  Laterality: Left;  5.5 hrs for entire procedure   ANTERIOR LUMBAR FUSION N/A 01/26/2021   Procedure: ANTERIOR LUMBAR FUSION LUMBAR FIVE-SACRAL ONE WITH POSTERIOR SPINAL FUSION INTERBODY (EXTENSION OF PREVIOUS FUSION);  Surgeon: Venita Lick, MD;  Location: [facility];  Service: Orthopedics;  Laterality: N/A;  6hrs Dr. Arbie Cookey to do approach Tap Block with exparel   APPENDECTOMY  1982   ARTHRODESIS METATARSALPHALANGEAL JOINT (MTPJ) Right 04/11/2023   Procedure: right 1st metatarsalphalangeal joint fusion;  Surgeon: Netta Cedars, MD;  Location: [facility];  Service: Orthopedics;  Laterality: Right;   BACK SURGERY     CARDIAC CATHETERIZATION  2003   CARDIAC CATHETERIZATION N/A 08/23/2016   Procedure: Left Heart Cath and Coronary  Angiography;  Surgeon: Orpah Cobb, MD;  Location: [facility] INVASIVE [facility];  Service: Cardiovascular;  Laterality: N/A;   CARPAL TUNNEL RELEASE Right    CATARACT EXTRACTION W/ INTRAOCULAR LENS  IMPLANT, BILATERAL  2016-2018   CHOLECYSTECTOMY OPEN  1982   COLONOSCOPY     CYSTO/ HOD/ BLADDER BX  2006   CYSTO/ HOD/ BLADDER BX/ FULGERATION  06-12-2011   EYE SURGERY     FINGER SURGERY Right 2001   "replaced worn cartilage on thumb w/tendons"   HYSTEROSCOPY WITH D & C  2005   INTERSTIM IMPLANT PLACEMENT  12/07/2011   Procedure: Leane Platt IMPLANT FIRST STAGE;  Surgeon: Martina Sinner, MD;  Location: [facility];  Service: Urology;  Laterality: Right;   INTERSTIM IMPLANT PLACEMENT  12/14/2011   Procedure: Leane Platt IMPLANT SECOND STAGE;  Surgeon: Martina Sinner, MD;  Location: [facility];  Service: Urology;  Laterality: Right;  rad tech ok by vickie at main   JOINT REPLACEMENT     LUMBAR LAMINECTOMY  2007   L3 - 5   TONSILLECTOMY AND ADENOIDECTOMY  1965   TOTAL KNEE ARTHROPLASTY Right 02/10/2020   Procedure: TOTAL KNEE ARTHROPLASTY;  Surgeon: Durene Romans, MD;  Location: [facility] ORS;  Service: Orthopedics;  Laterality: Right;  70 mins   TOTAL KNEE ARTHROPLASTY Left 07/20/2020   Procedure: TOTAL KNEE ARTHROPLASTY;  Surgeon: Durene Romans, MD;  Location: [facility];  Service: Orthopedics;  Laterality: Left;   TUBAL LIGATION     UPPER GI ENDOSCOPY     WRIST SURGERY Left    TFCC ("tendon repair")   Patient Active Problem List   Diagnosis Date Noted   Acute respiratory failure with hypoxia (HCC) 11/14/2021   Elevated LFTs    Influenza A 11/13/2021   S/P lumbar fusion 01/26/2021   Encounter for orthopedic follow-up care 10/25/2020   Pain in left knee 08/17/2020   Colitis presumed infectious 02/17/2020   Lesion of liver 02/17/2020   History of total knee arthroplasty 02/10/2020   Osteoarthritis of right knee 10/30/2019   Osteoarthritis of left knee 10/30/2019    Internal derangement of left knee 07/07/2019   Bilateral knee pain 03/21/2019   Low back pain 11/27/2018   Complication of surgical procedure 06/17/2018   Degeneration of lumbar intervertebral disc 06/17/2018   Degenerative spondylolisthesis 06/17/2018   Acute coronary syndrome (HCC) 04/02/2018   Hyperlipidemia 08/06/2017   Influenza due to influenza A virus 01/01/2017   COPD with acute exacerbation (HCC) 01/01/2017   Acute renal failure syndrome (HCC) 01/01/2017   Dehydration 01/01/2017  Preinfarction syndrome (HCC) 10/14/2016   Chest pain 08/22/2016   Supraventricular tachycardia (HCC) 08/22/2016   Obstructive sleep apnea syndrome    Hypertensive disorder    Gastroesophageal reflux disease    Depressive disorder    Coronary arteriosclerosis    Anxiety    Pain in the chest    Hypokalemia    Carpal tunnel syndrome of left wrist 02/18/2016   Leukocytosis 05/26/2014   Tobacco user 05/26/2014   Urge incontinence of urine 12/14/2011    PCP: Otho Perl NP  REFERRING PROVIDER: Dr Venita Lick  REFERRING DIAG: M54.51 (ICD-10-CM) - Vertebrogenic low back pain   Rationale for Evaluation and Treatment: Rehabilitation  THERAPY DIAG:  Other low back pain  Muscle weakness (generalized)  Difficulty in walking, not elsewhere classified  ONSET DATE: > 1 yr  SUBJECTIVE:                                                                                                                                                                                           SUBJECTIVE STATEMENT: Fusion 2022 L3-S1 Dr Shon Baton. Only got a little relief from the surgery. Pain has been ongoing for years. Has had PT in past but can't say if it has helped due to all of her other pain. Bilat TKR 2021, Shoulder pain L>R. Right foot pain due to OA, had surgery to clean it out and put a plate in April 0272. Pt active in home unpacking from move in Aug, also takes wood working classes. Legs will get week when  standing and walking needing a 5 minute seated rest period. Pt reports she pushes through the pain often.  "Balance is off".  Bladder stimulator placed to control bladder, pt reports stool incontinence.  PERTINENT HISTORY:  COPD  PAIN:  Are you having pain? Yes: NPRS scale: current 0/10; walking 400 ft 5-6/10; worst 10/10 Pain location: L lumbar spine Pain description: catches ; ache Aggravating factors: house hold activity standing > 5 minutes, lifting Relieving factors: sitting, tylenol  PRECAUTIONS: None  RED FLAGS: None   WEIGHT BEARING RESTRICTIONS: No  FALLS:  Has patient fallen in last 6 months? Yes. Number of falls 2 Fell backward after returning form hospital due to breathing problems.  LIVING ENVIRONMENT: Lives with: lives alone Lives in: House/apartment Stairs: Yes: External: 6 steps; on right going up Has following equipment at home: Single point cane and Walker - 2 wheeled  OCCUPATION: retired disable due to back x 15 yrs  PLOF: Independent  PATIENT GOALS: pain relief, decrease pain with walking  NEXT MD VISIT: as needed  OBJECTIVE:  Note: Objective measures were completed at Evaluation  unless otherwise noted.  DIAGNOSTIC FINDINGS:  CT Lumbar IMPRESSION: 1. 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 2. L2-3 moderate spinal stenosis from adjacent segment degeneration.  PATIENT SURVEYS:  FOTO Risk adjusted 32%; Primary score 40% with goal of 42%   COGNITION: Overall cognitive status: Within functional limits for tasks assessed     SENSATION: Paresthesias bil LE to feet  MUSCLE LENGTH: Hamstrings: Limited bilaterally tested in sitting   POSTURE: rounded shoulders, forward head, decreased lumbar lordosis, and flexed trunk   PALPATION: TTP throughout lumbar up to mid thoracic paraspinals and bilat glutes, significant ms tightness  LUMBAR ROM:   AROM eval  Flexion FT to patella   Extension neutral  Right lateral flexion 75% limited  Left lateral flexion 90% limited  Right rotation   Left rotation    (Blank rows = not tested)  LOWER EXTREMITY ROM:     Active  Right eval Left eval  Hip flexion 95 95  Hip extension ~-10 ~-10  Hip abduction    Hip adduction    Hip internal rotation    Hip external rotation    Knee flexion    Knee extension    Ankle dorsiflexion    Ankle plantarflexion    Ankle inversion    Ankle eversion     (Blank rows = not tested)  LOWER EXTREMITY MMT:    MMT Right eval Left eval  Hip flexion 31.8 32.4  Hip extension    Hip abduction 26.6 26.3  Hip adduction    Hip internal rotation    Hip external rotation    Knee flexion    Knee extension 40.1 47.8  Ankle dorsiflexion    Ankle plantarflexion    Ankle inversion    Ankle eversion     (Blank rows = not tested)  LUMBAR SPECIAL TESTS:  Straight leg raise test: Positive and Slump test: Negative  FUNCTIONAL TESTS:   Timed up and go (TUG): 12.39 4 stage balance: NBOS  GAIT: Distance walked: 300 ft Assistive device utilized:none used today has: Single point cane and Walker - 4 wheeled Level of assistance: Modified independence Comments: forward trunk flexed position, decreased stance phase lle, antalgic and guarded  TODAY'S TREATMENT:                                                                                                                              Eval   PATIENT EDUCATION:  Education details: Discussed eval findings, rehab rationale, aquatic program progression/POC and pools in area. Patient is in agreement  Person educated: Patient Education method: Explanation Education comprehension: verbalized understanding  HOME EXERCISE PROGRAM: TBA  ASSESSMENT:  CLINICAL IMPRESSION: Patient is a 67 y.o. f who was seen today for physical therapy evaluation and treatment for LBP. She has extensive lumbar dysfunction in addition to fusion a few years ago.  Her  pain sensitivity and paresthesias are consistent with her dx.  She has limited lumbar ROM and significant muscle tightness and weakness throughout her posterior core and hip flexors. She reports muscle weakness in LE with standing and walking > 5 minutes. She remains active working in her home and taking wood working classes despite limitations reporting she takes a lot of rest periods.  Unfortunately she is not a candidate for aquatic intervention due to both bladder and bowel incontinence.  She will benefit from land based PT to improve core and LE strength, ROM, decrease tightness and reduce pain to improve pt functional ability and QOL  OBJECTIVE IMPAIRMENTS: Abnormal gait, cardiopulmonary status limiting activity, decreased activity tolerance, decreased balance, decreased endurance, decreased knowledge of condition, decreased knowledge of use of DME, decreased mobility, difficulty walking, decreased ROM, decreased strength, impaired flexibility, improper body mechanics, postural dysfunction, obesity, and pain.   ACTIVITY LIMITATIONS: carrying, lifting, bending, standing, squatting, stairs, and locomotion level  PARTICIPATION LIMITATIONS: shopping, community activity, and occupation  PERSONAL FACTORS: Behavior pattern, Fitness, Past/current experiences, Time since onset of injury/illness/exacerbation, and 3+ comorbidities: see problem list  are also affecting patient's functional outcome.   REHAB POTENTIAL: Good  CLINICAL DECISION MAKING: Evolving/moderate complexity  EVALUATION COMPLEXITY: Moderate   GOALS: Goals reviewed with patient? Yes  SHORT TERM GOALS: Target date: 12/22/23  Pt will be indep and compliant with initial HEP  Baseline: Goal status: INITIAL    LONG TERM GOALS: Target date: 01/18/24  Pt to meet stated Foto Goal of 42% Baseline: 40 primary measure Goal status: INITIAL  2.  Pt will improve strength in all areas listed by at least 10 lbs to demonstrate improved  overall physical function  Baseline:  Goal status: INITIAL  3.  Pt will amb community distances with ad as needed without being limited by back pain Baseline: no ad limited by 200 ft Goal status: INITIAL  4.  Pt will improve lumbar ROM by 50%. Baseline: see chart Goal status: INITIAL  5.  Pt will tolerate standing/walking completing house hold chores for up towards 15 minutes before needing a seated rest period Baseline: 5 min Goal status: INITIAL  6.  Pt will be indep with final HEP for continued management of condition Baseline: none Goal status: INITIAL  PLAN:  PT FREQUENCY: 1-2x/week  PT DURATION: 8 weeks  PLANNED INTERVENTIONS: 97164- PT Re-evaluation, 97110-Therapeutic exercises, 97530- Therapeutic activity, 97112- Neuromuscular re-education, 97535- Self Care, 16109- Manual therapy, [phone]- Gait training, [phone]- Orthotic Fit/training, [phone]- Aquatic Therapy, [phone]- Ionotophoresis 4mg /ml Dexamethasone, Patient/Family education, Balance training, Stair training, Taping, Dry Needling, Joint mobilization, DME instructions, Cryotherapy, and Moist heat.  PLAN FOR NEXT SESSION: Land based: consider DN; LE and core strengthening, ROM, balance and gait retraining, postural training; pain management   Geni Bers, PT Corrie D[place]) Shalonda Sachse MPT 11/21/23 1:10 PM [facility] [address] [place], [place], [address] Phone: [phone]   Fax:  [phone]

## 2023-11-26 ENCOUNTER — Inpatient Hospital Stay: Payer: Medicare Other

## 2023-11-26 ENCOUNTER — Inpatient Hospital Stay: Payer: Medicare Other | Attending: Oncology | Admitting: Oncology

## 2023-11-26 VITALS — BP 138/69 | HR 71 | Temp 97.9°F | Resp 18 | Ht <= 58 in | Wt 172.9 lb

## 2023-11-26 DIAGNOSIS — I2510 Atherosclerotic heart disease of native coronary artery without angina pectoris: Secondary | ICD-10-CM | POA: Insufficient documentation

## 2023-11-26 DIAGNOSIS — D509 Iron deficiency anemia, unspecified: Secondary | ICD-10-CM | POA: Diagnosis not present

## 2023-11-26 DIAGNOSIS — D649 Anemia, unspecified: Secondary | ICD-10-CM

## 2023-11-26 DIAGNOSIS — D72829 Elevated white blood cell count, unspecified: Secondary | ICD-10-CM | POA: Insufficient documentation

## 2023-11-26 DIAGNOSIS — J449 Chronic obstructive pulmonary disease, unspecified: Secondary | ICD-10-CM | POA: Insufficient documentation

## 2023-11-26 DIAGNOSIS — I10 Essential (primary) hypertension: Secondary | ICD-10-CM | POA: Insufficient documentation

## 2023-11-26 DIAGNOSIS — K760 Fatty (change of) liver, not elsewhere classified: Secondary | ICD-10-CM | POA: Insufficient documentation

## 2023-11-26 DIAGNOSIS — Z87891 Personal history of nicotine dependence: Secondary | ICD-10-CM | POA: Diagnosis not present

## 2023-11-26 DIAGNOSIS — D1803 Hemangioma of intra-abdominal structures: Secondary | ICD-10-CM | POA: Diagnosis not present

## 2023-11-26 LAB — CBC WITH DIFFERENTIAL (CANCER CENTER ONLY)
Abs Immature Granulocytes: 0.07 10*3/uL (ref 0.00–0.07)
Basophils Absolute: 0.1 10*3/uL (ref 0.0–0.1)
Basophils Relative: 1 %
Eosinophils Absolute: 0.4 10*3/uL (ref 0.0–0.5)
Eosinophils Relative: 2 %
HCT: 44.7 % (ref 36.0–46.0)
Hemoglobin: 13.9 g/dL (ref 12.0–15.0)
Immature Granulocytes: 0 %
Lymphocytes Relative: 12 %
Lymphs Abs: 2 10*3/uL (ref 0.7–4.0)
MCH: 26.9 pg (ref 26.0–34.0)
MCHC: 31.1 g/dL (ref 30.0–36.0)
MCV: 86.5 fL (ref 80.0–100.0)
Monocytes Absolute: 0.8 10*3/uL (ref 0.1–1.0)
Monocytes Relative: 5 %
Neutro Abs: 13.6 10*3/uL — ABNORMAL HIGH (ref 1.7–7.7)
Neutrophils Relative %: 80 %
Platelet Count: 279 10*3/uL (ref 150–400)
RBC: 5.17 MIL/uL — ABNORMAL HIGH (ref 3.87–5.11)
RDW: 17.4 % — ABNORMAL HIGH (ref 11.5–15.5)
WBC Count: 17 10*3/uL — ABNORMAL HIGH (ref 4.0–10.5)
nRBC: 0 % (ref 0.0–0.2)

## 2023-11-26 LAB — IRON AND TIBC
Iron: 101 ug/dL (ref 28–170)
Saturation Ratios: 22 % (ref 10.4–31.8)
TIBC: 458 ug/dL — ABNORMAL HIGH (ref 250–450)
UIBC: 357 ug/dL

## 2023-11-26 LAB — FERRITIN: Ferritin: 82 ng/mL (ref 11–307)

## 2023-11-26 NOTE — Progress Notes (Signed)
  [facility] OFFICE PROGRESS NOTE   Diagnosis: Leukocytosis  INTERVAL HISTORY:   [name] returns as scheduled.  She is taking iron.  No difficulty with bowel function.  No bleeding.  She reports "sweats "at the neck and night.  She is scheduled to see Dr. Lorenso Quarry for evaluation of the iron deficiency.  Objective:  Vital signs in last 24 hours:  Blood pressure 138/69, pulse 71, temperature 97.9 F (36.6 C), temperature source Temporal, resp. rate 18, height 4\' 10"  (1.473 m), weight 172 lb 14.4 oz (78.4 kg), last menstrual period 09/06/2005, SpO2 98%.    Lymphatics: No cervical, supraclavicular, axillary, or inguinal nodes Resp: Lungs clear bilaterally Cardio: Regular rate and rhythm GI: No hepatosplenomegaly, tender in the right subcostal and subxiphoid areas.  No mass. Vascular: No leg edema  Lab Results:  Lab Results  Component Value Date   WBC 17.0 (H) 11/26/2023   HGB 13.9 11/26/2023   HCT 44.7 11/26/2023   MCV 86.5 11/26/2023   PLT 279 11/26/2023   NEUTROABS 13.6 (H) 11/26/2023    CMP  Lab Results  Component Value Date   NA 137 09/21/2023   K 4.5 09/21/2023   CL 100 09/21/2023   CO2 29 09/21/2023   GLUCOSE 121 (H) 09/21/2023   BUN 24 (H) 09/21/2023   CREATININE 1.01 (H) 09/21/2023   CALCIUM 9.6 09/21/2023   PROT 7.7 09/21/2023   ALBUMIN 4.3 09/21/2023   AST 45 (H) 09/21/2023   ALT 40 09/21/2023   ALKPHOS 106 09/21/2023   BILITOT 0.5 09/21/2023   GFRNONAA >60 09/21/2023   GFRAA >60 07/21/2020     Medications: I have reviewed the patient's current medications.   Assessment/Plan: Leukocytosis 05/27/2015-BCR/ABL not detected, negative for JAK2 V617F mutation 09/15/2015 bone marrow biopsy-normocellular bone marrow with trilineage hematopoiesis, no definite morphologic evidence of a myeloproliferative or myelodysplastic process, cytogenetic analysis unremarkable 05/01/2022 advanced NGS JAK2, MPL and CALR normal Hepatomegaly on exam  05/01/2022 05/03/2022 CT abdomen/pelvis-no acute findings.  Stable mild hepatomegaly and diffuse hepatic steatosis.  Stable small benign hemangioma right hepatic lobe COPD Hypertension CAD Quit smoking November 2022 Iron deficiency anemia 09/21/2023      Disposition: [name] has chronic leukocytosis of unclear etiology.  The white count is stable.  She was diagnosed with iron deficiency anemia when she was here in October.  The anemia has corrected with iron.  She did not return stool Hemoccult cards.  She is scheduled for evaluation by gastroenterology this week.  I recommended she return stool Hemoccult cards and follow-up with gastroenterology as scheduled.  She will return for an office and lab visit in 4 months.  Thornton Papas, MD  11/26/2023  12:09 PM

## 2023-11-27 ENCOUNTER — Telehealth: Payer: Self-pay | Admitting: *Deleted

## 2023-11-27 NOTE — Telephone Encounter (Signed)
-----   Message from Thornton Papas sent at 11/26/2023  4:37 PM EST ----- Please call patient iron level is now normal, continue oral iron, follow-up with GI as scheduled, return stool Hemoccult cards

## 2023-11-27 NOTE — Telephone Encounter (Signed)
[name] made aware of lab results, continue oral iron and f/u with GI and return stool cards

## 2023-11-28 DIAGNOSIS — D508 Other iron deficiency anemias: Secondary | ICD-10-CM | POA: Diagnosis not present

## 2023-11-28 DIAGNOSIS — R194 Change in bowel habit: Secondary | ICD-10-CM | POA: Diagnosis not present

## 2023-11-28 DIAGNOSIS — R140 Abdominal distension (gaseous): Secondary | ICD-10-CM | POA: Diagnosis not present

## 2023-11-30 DIAGNOSIS — R194 Change in bowel habit: Secondary | ICD-10-CM | POA: Diagnosis not present

## 2023-11-30 DIAGNOSIS — R140 Abdominal distension (gaseous): Secondary | ICD-10-CM | POA: Diagnosis not present

## 2023-12-03 ENCOUNTER — Other Ambulatory Visit: Payer: Self-pay | Admitting: *Deleted

## 2023-12-03 DIAGNOSIS — K760 Fatty (change of) liver, not elsewhere classified: Secondary | ICD-10-CM | POA: Diagnosis not present

## 2023-12-03 DIAGNOSIS — D1803 Hemangioma of intra-abdominal structures: Secondary | ICD-10-CM | POA: Diagnosis not present

## 2023-12-03 DIAGNOSIS — D649 Anemia, unspecified: Secondary | ICD-10-CM

## 2023-12-03 DIAGNOSIS — I10 Essential (primary) hypertension: Secondary | ICD-10-CM | POA: Diagnosis not present

## 2023-12-03 DIAGNOSIS — J449 Chronic obstructive pulmonary disease, unspecified: Secondary | ICD-10-CM | POA: Diagnosis not present

## 2023-12-03 DIAGNOSIS — D509 Iron deficiency anemia, unspecified: Secondary | ICD-10-CM | POA: Diagnosis not present

## 2023-12-03 DIAGNOSIS — D72829 Elevated white blood cell count, unspecified: Secondary | ICD-10-CM | POA: Diagnosis not present

## 2023-12-03 LAB — OCCULT BLOOD X 1 CARD TO LAB, STOOL
Fecal Occult Bld: NEGATIVE
Fecal Occult Bld: POSITIVE — AB
Fecal Occult Bld: POSITIVE — AB

## 2023-12-05 ENCOUNTER — Telehealth: Payer: Self-pay

## 2023-12-05 NOTE — Telephone Encounter (Signed)
Patient gave verbal understanding and had no further questions or concerns. I fax over the result to [facility].

## 2023-12-05 NOTE — Telephone Encounter (Signed)
-----   Message from Thornton Papas sent at 12/04/2023  7:45 PM EST ----- Please call patient, stool is positive for blood, copy to her GI MD, needs to f/u with GI

## 2023-12-13 ENCOUNTER — Encounter (HOSPITAL_BASED_OUTPATIENT_CLINIC_OR_DEPARTMENT_OTHER): Payer: Self-pay | Admitting: Physical Therapy

## 2023-12-13 ENCOUNTER — Ambulatory Visit (HOSPITAL_BASED_OUTPATIENT_CLINIC_OR_DEPARTMENT_OTHER): Payer: Medicare Other | Admitting: Physical Therapy

## 2023-12-13 DIAGNOSIS — M6281 Muscle weakness (generalized): Secondary | ICD-10-CM | POA: Diagnosis not present

## 2023-12-13 DIAGNOSIS — M5459 Other low back pain: Secondary | ICD-10-CM

## 2023-12-13 DIAGNOSIS — R262 Difficulty in walking, not elsewhere classified: Secondary | ICD-10-CM

## 2023-12-13 DIAGNOSIS — M5451 Vertebrogenic low back pain: Secondary | ICD-10-CM | POA: Diagnosis not present

## 2023-12-13 NOTE — Therapy (Signed)
OUTPATIENT PHYSICAL THERAPY THORACOLUMBAR EVALUATION   Patient Name: [name] MRN: [id number] DOB:[birth year], 67 y.o., female Today's Date: 12/13/2023  END OF SESSION:  PT End of Session - 12/13/23 0954     Visit Number 2    Number of Visits 16    Date for PT Re-Evaluation 01/18/24    Authorization Type mcr    PT Start Time 0930    PT Stop Time 1012    PT Time Calculation (min) 42 min    Activity Tolerance Patient limited by pain    Behavior During Therapy [facility] for tasks assessed/performed              Past Medical History:  Diagnosis Date   Acute coronary syndrome (HCC)    Acute exacerbation of chronic obstructive airways disease (HCC)    Acute renal failure syndrome (HCC)    Agoraphobia without history of panic disorder    Anginal pain (HCC)    Anxiety    Arthritis    "qwhere" (04/03/2018)   Bilateral carpal tunnel syndrome 02/18/2016   Bilateral knee pain    Carpal tunnel syndrome of left wrist    Chest pain    Childhood asthma    Chronic bronchitis (HCC)    Chronic lower back pain    Colitis presumed infectious    COPD (chronic obstructive pulmonary disease) (HCC)    Coronary artery disease CARDIOLOGIST- DR  Algie Coffer  (VISIT 03-30-11 W/ CHART)   NON-OBS. CAD   (STRESS TEST NOV. 2011   Depression    DJD (degenerative joint disease)    JOINT PAIN   Dyspnea    "anytime but mostly on exertion and smoking"   Dysrhythmia 2017   SVT   Elevated liver enzymes    Fibromyalgia    Frequency of urination    GERD (gastroesophageal reflux disease)     W/ NEXIUM   Heart murmur    Hemorrhoids    High cholesterol    History of blood transfusion    "related to anemia" (04/03/2018)   History of carpal tunnel syndrome    Right   History of gastric ulcer 2004   Hyperlipidemia    Hypertension    IBS (irritable bowel syndrome)    Incisional pain s/p interstim implant 1st stage--- 12-07-11    left upper buttock-- pt states dressing clean dry and intact (on  12-08-11)   Influenza due to influenza A virus    Insomnia    Internal derangement of left knee    Iron deficiency anemia 2004   Lesion of liver    Leukocytosis    Lumbar pain    Nocturia    Non-productive cough    Numbness in both hands AT TIMES   OSA (obstructive sleep apnea)    "suppose to wear mask; I don't" (04/03/2018)   Osteoarthritis of left knee    PONV (postoperative nausea and vomiting)    Post laminectomy syndrome    Preinfarction syndrome (HCC)    Supraventricular tachycardia (HCC)    SVT (supraventricular tachycardia) (HCC)    Tobacco user    Urge urinary incontinence    Past Surgical History:  Procedure Laterality Date   ABDOMINAL EXPOSURE N/A 01/26/2021   Procedure: ABDOMINAL EXPOSURE;  Surgeon: Larina Earthly, MD;  Location: [facility] OR;  Service: Vascular;  Laterality: N/A;   ANTERIOR CERVICAL DECOMP/DISCECTOMY FUSION  2008   C4 - T1 ("screws from 1st OR came out")   ANTERIOR CERVICAL DECOMP/DISCECTOMY FUSION  2002   C4 -  7   ANTERIOR LATERAL LUMBAR FUSION WITH PERCUTANEOUS SCREW 2 LEVEL Left 11/27/2018   Procedure: XLIF Lumbar 3-5, posterior spinal fusion L3-5;  Surgeon: Venita Lick, MD;  Location: [facility] OR;  Service: Orthopedics;  Laterality: Left;  5.5 hrs for entire procedure   ANTERIOR LUMBAR FUSION N/A 01/26/2021   Procedure: ANTERIOR LUMBAR FUSION LUMBAR FIVE-SACRAL ONE WITH POSTERIOR SPINAL FUSION INTERBODY (EXTENSION OF PREVIOUS FUSION);  Surgeon: Venita Lick, MD;  Location: [facility] OR;  Service: Orthopedics;  Laterality: N/A;  6hrs Dr. Arbie Cookey to do approach Tap Block with exparel   APPENDECTOMY  1982   ARTHRODESIS METATARSALPHALANGEAL JOINT (MTPJ) Right 04/11/2023   Procedure: right 1st metatarsalphalangeal joint fusion;  Surgeon: Netta Cedars, MD;  Location: [facility];  Service: Orthopedics;  Laterality: Right;   BACK SURGERY     CARDIAC CATHETERIZATION  2003   CARDIAC CATHETERIZATION N/A 08/23/2016   Procedure: Left Heart Cath and Coronary  Angiography;  Surgeon: Orpah Cobb, MD;  Location: [facility] INV[facility];  Service: Cardiovascular;  Laterality: N/A;   CARPAL TUNNEL RELEASE Right    CATARACT EXTRACTION W/ INTRAOCULAR LENS  IMPLANT, BILATERAL  2016-2018   CHOLECYSTECTOMY OPEN  1982   COLONOSCOPY     CYSTO/ HOD/ BLADDER BX  2006   CYSTO/ HOD/ BLADDER BX/ FULGERATION  06-12-2011   EYE SURGERY     FINGER SURGERY Right 2001   "replaced worn cartilage on thumb w/tendons"   HYSTEROSCOPY WITH D & C  2005   INTERSTIM IMPLANT PLACEMENT  12/07/2011   Procedure: Leane Platt IMPLANT FIRST STAGE;  Surgeon: Martina Sinner, MD;  Location: [facility];  Service: Urology;  Laterality: Right;   INTERSTIM IMPLANT PLACEMENT  12/14/2011   Procedure: Leane Platt IMPLANT SECOND STAGE;  Surgeon: Martina Sinner, MD;  [facility];  Service: Urology;  Laterality: Right;  rad tech ok by vickie at main   JOINT REPLACEMENT     LUMBAR LAMINECTOMY  2007   L3 - 5   TONSILLECTOMY AND ADENOIDECTOMY  1965   TOTAL KNEE ARTHROPLASTY Right 02/10/2020   Procedure: TOTAL KNEE ARTHROPLASTY;  Surgeon: Durene Romans, MD;  Location: [facility]S;  Service: Orthopedics;  Laterality: Right;  70 mins   TOTAL KNEE ARTHROPLASTY Left 07/20/2020   Procedure: TOTAL KNEE ARTHROPLASTY;  Surgeon: Durene Romans, MD;  Location: [facility];  Service: Orthopedics;  Laterality: Left;   TUBAL LIGATION     UPPER GI ENDOSCOPY     WRIST SURGERY Left    TFCC ("tendon repair")   Patient Active Problem List   Diagnosis Date Noted   Acute respiratory failure with hypoxia (HCC) 11/14/2021   Elevated LFTs    Influenza A 11/13/2021   S/P lumbar fusion 01/26/2021   Encounter for orthopedic follow-up care 10/25/2020   Pain in left knee 08/17/2020   Colitis presumed infectious 02/17/2020   Lesion of liver 02/17/2020   History of total knee arthroplasty 02/10/2020   Osteoarthritis of right knee 10/30/2019   Osteoarthritis of left knee 10/30/2019    Internal derangement of left knee 07/07/2019   Bilateral knee pain 03/21/2019   Low back pain 11/27/2018   Complication of surgical procedure 06/17/2018   Degeneration of lumbar intervertebral disc 06/17/2018   Degenerative spondylolisthesis 06/17/2018   Acute coronary syndrome (HCC) 04/02/2018   Hyperlipidemia 08/06/2017   Influenza due to influenza A virus 01/01/2017   COPD with acute exacerbation (HCC) 01/01/2017   Acute renal failure syndrome (HCC) 01/01/2017   Dehydration 01/01/2017  Preinfarction syndrome (HCC) 10/14/2016   Chest pain 08/22/2016   Supraventricular tachycardia (HCC) 08/22/2016   Obstructive sleep apnea syndrome    Hypertensive disorder    Gastroesophageal reflux disease    Depressive disorder    Coronary arteriosclerosis    Anxiety    Pain in the chest    Hypokalemia    Carpal tunnel syndrome of left wrist 02/18/2016   Leukocytosis 05/26/2014   Tobacco user 05/26/2014   Urge incontinence of urine 12/14/2011    PCP: Otho Perl NP  REFERRING PROVIDER: Dr Venita Lick  REFERRING DIAG: M54.51 (ICD-10-CM) - Vertebrogenic low back pain   Rationale for Evaluation and Treatment: Rehabilitation  THERAPY DIAG:  Other low back pain  Muscle weakness (generalized)  Difficulty in walking, not elsewhere classified  ONSET DATE: > 1 yr  SUBJECTIVE:                                                                                                                                                                                           SUBJECTIVE STATEMENT: The patient reports her back is sore today. She reports it has been for some time.    Eval:  Fusion 2022 L3-S1 Dr Shon Baton. Only got a little relief from the surgery. Pain has been ongoing for years. Has had PT in past but can't say if it has helped due to all of her other pain. Bilat TKR 2021, Shoulder pain L>R. Right foot pain due to OA, had surgery to clean it out and put a plate in April 6962. Pt active  in home unpacking from move in Aug, also takes wood working classes. Legs will get week when standing and walking needing a 5 minute seated rest period. Pt reports she pushes through the pain often.  "Balance is off".  Bladder stimulator placed to control bladder, pt reports stool incontinence.  PERTINENT HISTORY:  COPD  PAIN:  Are you having pain? Yes: NPRS scale: 7-8this morning  Pain location: L lumbar spine Pain description: catches ; ache Aggravating factors: house hold activity standing > 5 minutes, lifting Relieving factors: sitting, tylenol  PRECAUTIONS: None  RED FLAGS: None   WEIGHT BEARING RESTRICTIONS: No  FALLS:  Has patient fallen in last 6 months? Yes. Number of falls 2 Fell backward after returning form hospital due to breathing problems.  LIVING ENVIRONMENT: Lives with: lives alone Lives in: House/apartment Stairs: Yes: External: 6 steps; on right going up Has following equipment at home: Single point cane and Walker - 2 wheeled  OCCUPATION: retired disable due to back x 15 yrs  PLOF: Independent  PATIENT GOALS: pain relief, decrease pain with walking  NEXT MD VISIT: as needed  OBJECTIVE:  Note: Objective measures were completed at Evaluation unless otherwise noted.  DIAGNOSTIC FINDINGS:  CT Lumbar IMPRESSION: 1. 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 2. L2-3 moderate spinal stenosis from adjacent segment degeneration.  PATIENT SURVEYS:  FOTO Risk adjusted 32%; Primary score 40% with goal of 42%   COGNITION: Overall cognitive status: Within functional limits for tasks assessed     SENSATION: Paresthesias bil LE to feet  MUSCLE LENGTH: Hamstrings: Limited bilaterally tested in sitting   POSTURE: rounded shoulders, forward head, decreased lumbar lordosis, and flexed trunk   PALPATION: TTP throughout lumbar up to mid thoracic paraspinals and bilat glutes, significant ms  tightness  LUMBAR ROM:   AROM eval  Flexion FT to patella  Extension neutral  Right lateral flexion 75% limited  Left lateral flexion 90% limited  Right rotation   Left rotation    (Blank rows = not tested)  LOWER EXTREMITY ROM:     Active  Right eval Left eval  Hip flexion 95 95  Hip extension ~-10 ~-10  Hip abduction    Hip adduction    Hip internal rotation    Hip external rotation    Knee flexion    Knee extension    Ankle dorsiflexion    Ankle plantarflexion    Ankle inversion    Ankle eversion     (Blank rows = not tested)  LOWER EXTREMITY MMT:    MMT Right eval Left eval  Hip flexion 31.8 32.4  Hip extension    Hip abduction 26.6 26.3  Hip adduction    Hip internal rotation    Hip external rotation    Knee flexion    Knee extension 40.1 47.8  Ankle dorsiflexion    Ankle plantarflexion    Ankle inversion    Ankle eversion     (Blank rows = not tested)  LUMBAR SPECIAL TESTS:  Straight leg raise test: Positive and Slump test: Negative  FUNCTIONAL TESTS:   Timed up and go (TUG): 12.39 4 stage balance: NBOS  GAIT: Distance walked: 300 ft Assistive device utilized:none used today has: Single point cane and Walker - 4 wheeled Level of assistance: Modified independence Comments: forward trunk flexed position, decreased stance phase lle, antalgic and guarded  TODAY'S TREATMENT:                                                                                                                              Eval  12/26 Manaul: trigger point release to left lumbar paraspinal and gluteals. Reviewe of trigger points with patient. Patient shown self trigger point release techniques using thera-cane and tennis ball.   Supine:  LTR X15 WITH CUING FOR RANGE   Seated: Forward flexion stretch 5x 5 sec hold  Lateral flexion stretch for left 5x 5 sec hold with min cuing Press with TA breathing   Reviwwed anatomy of  TA breathing and paraspinal involvement in  stenosis  Given HEP and reviewed use of Medbrideg     PATIENT EDUCATION:  Education details: TA breathing; anatomy of stenosis  Person educated: Patient Education method: Explanation Education comprehension: verbalized understanding  HOME EXERCISE PROGRAM:  Access Code: [email or web address] URL: [email or web address] Date: 12/13/2023 Prepared by: Lorayne Bender  Exercises - Supine Lower Trunk Rotation  - 1 x daily - 7 x weekly - 3 sets - 10 reps - Seated Bilateral Shoulder Flexion Towel Slide at Table Top  - 1 x daily - 7 x weekly - 3 sets - 10 reps - Standing Glute Med Mobilization with Small Ball on Wall  - 1 x daily - 7 x weekly - 3 sets - 10 reps - Theracane Over Shoulder  - 1 x daily - 7 x weekly - 3 sets - 10 reps - Supine Transversus Abdominis Bracing - Hands on Thighs  - 1 x daily - 7 x weekly - 3 sets - 7-8 reps ASSESSMENT:  CLINICAL IMPRESSION: Therapy reviewed pain management portion of her program today. She was advised to take the stretches and self soft tissue mobilization and see which ones are most effective in reducing her pain level. Following manual therapy and stretching, she felt like she could straighten more without pain> we reviewed TA breathing for her to proactice. Next visit we will add in more strengtheing exercises.    Eval: Patient is a 67 y.o. f who was seen today for physical therapy evaluation and treatment for LBP. She has extensive lumbar dysfunction in addition to fusion a few years ago.  Her pain sensitivity and paresthesias are consistent with her dx.  She has limited lumbar ROM and significant muscle tightness and weakness throughout her posterior core and hip flexors. She reports muscle weakness in LE with standing and walking > 5 minutes. She remains active working in her home and taking wood working classes despite limitations reporting she takes a lot of rest periods.  Unfortunately she is not a candidate for aquatic intervention due to both  bladder and bowel incontinence.  She will benefit from land based PT to improve core and LE strength, ROM, decrease tightness and reduce pain to improve pt functional ability and QOL  OBJECTIVE IMPAIRMENTS: Abnormal gait, cardiopulmonary status limiting activity, decreased activity tolerance, decreased balance, decreased endurance, decreased knowledge of condition, decreased knowledge of use of DME, decreased mobility, difficulty walking, decreased ROM, decreased strength, impaired flexibility, improper body mechanics, postural dysfunction, obesity, and pain.   ACTIVITY LIMITATIONS: carrying, lifting, bending, standing, squatting, stairs, and locomotion level  PARTICIPATION LIMITATIONS: shopping, community activity, and occupation  PERSONAL FACTORS: Behavior pattern, Fitness, Past/current experiences, Time since onset of injury/illness/exacerbation, and 3+ comorbidities: see problem list  are also affecting patient's functional outcome.   REHAB POTENTIAL: Good  CLINICAL DECISION MAKING: Evolving/moderate complexity  EVALUATION COMPLEXITY: Moderate   GOALS: Goals reviewed with patient? Yes  SHORT TERM GOALS: Target date: 12/22/23  Pt will be indep and compliant with initial HEP  Baseline: Goal status: INITIAL    LONG TERM GOALS: Target date: 01/18/24  Pt to meet stated Foto Goal of 42% Baseline: 40 primary measure Goal status: INITIAL  2.  Pt will improve strength in all areas listed by at least 10 lbs to demonstrate improved overall physical function  Baseline:  Goal status: INITIAL  3.  Pt will amb community distances with ad as needed without being limited by back pain Baseline: no ad limited by 200 ft Goal  status: INITIAL  4.  Pt will improve lumbar ROM by 50%. Baseline: see chart Goal status: INITIAL  5.  Pt will tolerate standing/walking completing house hold chores for up towards 15 minutes before needing a seated rest period Baseline: 5 min Goal status:  INITIAL  6.  Pt will be indep with final HEP for continued management of condition Baseline: none Goal status: INITIAL  PLAN:  PT FREQUENCY: 1-2x/week  PT DURATION: 8 weeks  PLANNED INTERVENTIONS: 97164- PT Re-evaluation, 97110-Therapeutic exercises, 97530- Therapeutic activity, 97112- Neuromuscular re-education, 97535- Self Care, 16109- Manual therapy, [phone]- Gait training, [phone]- Orthotic Fit/training, [phone]- Aquatic Therapy, [phone]- Ionotophoresis 4mg /ml Dexamethasone, Patient/Family education, Balance training, Stair training, Taping, Dry Needling, Joint mobilization, DME instructions, Cryotherapy, and Moist heat.  PLAN FOR NEXT SESSION: Land based: consider DN; LE and core strengthening, ROM, balance and gait retraining, postural training; pain management   Dessie Coma, PT Cur[place]) Ziemba MPT 12/13/23 10:47 AM [facility] [address] [place], [place], [address] Phone: [phone]   Fax:  [phone]

## 2023-12-18 ENCOUNTER — Ambulatory Visit (HOSPITAL_BASED_OUTPATIENT_CLINIC_OR_DEPARTMENT_OTHER): Payer: Medicare Other | Admitting: Physical Therapy

## 2023-12-25 DIAGNOSIS — I10 Essential (primary) hypertension: Secondary | ICD-10-CM | POA: Diagnosis not present

## 2023-12-25 DIAGNOSIS — R072 Precordial pain: Secondary | ICD-10-CM | POA: Diagnosis not present

## 2023-12-25 DIAGNOSIS — J449 Chronic obstructive pulmonary disease, unspecified: Secondary | ICD-10-CM | POA: Diagnosis not present

## 2023-12-25 DIAGNOSIS — M48061 Spinal stenosis, lumbar region without neurogenic claudication: Secondary | ICD-10-CM | POA: Diagnosis not present

## 2023-12-26 ENCOUNTER — Ambulatory Visit (HOSPITAL_BASED_OUTPATIENT_CLINIC_OR_DEPARTMENT_OTHER): Payer: Medicare Other | Attending: Orthopedic Surgery | Admitting: Physical Therapy

## 2023-12-26 ENCOUNTER — Encounter (HOSPITAL_BASED_OUTPATIENT_CLINIC_OR_DEPARTMENT_OTHER): Payer: Self-pay | Admitting: Physical Therapy

## 2023-12-26 DIAGNOSIS — M5459 Other low back pain: Secondary | ICD-10-CM | POA: Diagnosis not present

## 2023-12-26 DIAGNOSIS — M6281 Muscle weakness (generalized): Secondary | ICD-10-CM | POA: Insufficient documentation

## 2023-12-26 DIAGNOSIS — R262 Difficulty in walking, not elsewhere classified: Secondary | ICD-10-CM | POA: Insufficient documentation

## 2023-12-26 NOTE — Therapy (Signed)
 OUTPATIENT PHYSICAL THERAPY THORACOLUMBAR EVALUATION   Patient Name: [name] MRN: [id number] DOB:[birth year], 68 y.o., female Today's Date: 12/27/2023  END OF SESSION:  PT End of Session - 12/27/23 1115     Visit Number 3    Number of Visits 16    Date for PT Re-Evaluation 01/18/24    Authorization Type mcr    PT Start Time 1430    PT Stop Time 1513    PT Time Calculation (min) 43 min    Activity Tolerance Patient limited by pain    Behavior During Therapy [facility] for tasks assessed/performed               Past Medical History:  Diagnosis Date   Acute coronary syndrome (HCC)    Acute exacerbation of chronic obstructive airways disease (HCC)    Acute renal failure syndrome (HCC)    Agoraphobia without history of panic disorder    Anginal pain (HCC)    Anxiety    Arthritis    qwhere (04/03/2018)   Bilateral carpal tunnel syndrome 02/18/2016   Bilateral knee pain    Carpal tunnel syndrome of left wrist    Chest pain    Childhood asthma    Chronic bronchitis (HCC)    Chronic lower back pain    Colitis presumed infectious    COPD (chronic obstructive pulmonary disease) (HCC)    Coronary artery disease CARDIOLOGIST- DR  CLAUDENE  (VISIT 03-30-11 W/ CHART)   NON-OBS. CAD   (STRESS TEST NOV. 2011   Depression    DJD (degenerative joint disease)    JOINT PAIN   Dyspnea    anytime but mostly on exertion and smoking   Dysrhythmia 2017   SVT   Elevated liver enzymes    Fibromyalgia    Frequency of urination    GERD (gastroesophageal reflux disease)     W/ NEXIUM   Heart murmur    Hemorrhoids    High cholesterol    History of blood transfusion    related to anemia (04/03/2018)   History of carpal tunnel syndrome    Right   History of gastric ulcer 2004   Hyperlipidemia    Hypertension    IBS (irritable bowel syndrome)    Incisional pain s/p interstim implant 1st stage--- 12-07-11    left upper buttock-- pt states dressing clean dry and intact (on  12-08-11)   Influenza due to influenza A virus    Insomnia    Internal derangement of left knee    Iron deficiency anemia 2004   Lesion of liver    Leukocytosis    Lumbar pain    Nocturia    Non-productive cough    Numbness in both hands AT TIMES   OSA (obstructive sleep apnea)    suppose to wear mask; I don't (04/03/2018)   Osteoarthritis of left knee    PONV (postoperative nausea and vomiting)    Post laminectomy syndrome    Preinfarction syndrome (HCC)    Supraventricular tachycardia (HCC)    SVT (supraventricular tachycardia) (HCC)    Tobacco user    Urge urinary incontinence    Past Surgical History:  Procedure Laterality Date   ABDOMINAL EXPOSURE N/A 01/26/2021   Procedure: ABDOMINAL EXPOSURE;  Surgeon: Oris Krystal FALCON, MD;  Location: [facility] OR;  Service: Vascular;  Laterality: N/A;   ANTERIOR CERVICAL DECOMP/DISCECTOMY FUSION  2008   C4 - T1 (screws from 1st OR came out)   ANTERIOR CERVICAL DECOMP/DISCECTOMY FUSION  2002  C4 - 7   ANTERIOR LATERAL LUMBAR FUSION WITH PERCUTANEOUS SCREW 2 LEVEL Left 11/27/2018   Procedure: XLIF Lumbar 3-5, posterior spinal fusion L3-5;  Surgeon: Burnetta Aures, MD;  Location: [facility] OR;  Service: Orthopedics;  Laterality: Left;  5.5 hrs for entire procedure   ANTERIOR LUMBAR FUSION N/A 01/26/2021   Procedure: ANTERIOR LUMBAR FUSION LUMBAR FIVE-SACRAL ONE WITH POSTERIOR SPINAL FUSION INTERBODY (EXTENSION OF PREVIOUS FUSION);  Surgeon: Burnetta Aures, MD;  Location: [facility] OR;  Service: Orthopedics;  Laterality: N/A;  6hrs Dr. Oris to do approach Tap Block with exparel    APPENDECTOMY  1982   ARTHRODESIS METATARSALPHALANGEAL JOINT (MTPJ) Right 04/11/2023   Procedure: right 1st metatarsalphalangeal joint fusion;  Surgeon: Barton Drape, MD;  Location: [facility];  Service: Orthopedics;  Laterality: Right;   BACK SURGERY     CARDIAC CATHETERIZATION  2003   CARDIAC CATHETERIZATION N/A 08/23/2016   Procedure: Left Heart Cath and Coronary  Angiography;  Surgeon: Salena Negri, MD;  Location: [facility] IN[facility];  Service: Cardiovascular;  Laterality: N/A;   CARPAL TUNNEL RELEASE Right    CATARACT EXTRACTION W/ INTRAOCULAR LENS  IMPLANT, BILATERAL  2016-2018   CHOLECYSTECTOMY OPEN  1982   COLONOSCOPY     CYSTO/ HOD/ BLADDER BX  2006   CYSTO/ HOD/ BLADDER BX/ FULGERATION  06-12-2011   EYE SURGERY     FINGER SURGERY Right 2001   replaced worn cartilage on thumb w/tendons   HYSTEROSCOPY WITH D & C  2005   INTERSTIM IMPLANT PLACEMENT  12/07/2011   Procedure: RENNA IMPLANT FIRST STAGE;  Surgeon: Glendia DELENA Elizabeth, MD;  Location: [facility];  Service: Urology;  Laterality: Right;   INTERSTIM IMPLANT PLACEMENT  12/14/2011   Procedure: RENNA IMPLANT SECOND STAGE;  Surgeon: Glendia DELENA Elizabeth, MD;  Location: [facility];  Service: Urology;  Laterality: Right;  rad tech ok by vickie at main   JOINT REPLACEMENT     LUMBAR LAMINECTOMY  2007   L3 - 5   TONSILLECTOMY AND ADENOIDECTOMY  1965   TOTAL KNEE ARTHROPLASTY Right 02/10/2020   Procedure: TOTAL KNEE ARTHROPLASTY;  Surgeon: Ernie Cough, MD;  Location: [facility]S;  Service: Orthopedics;  Laterality: Right;  70 mins   TOTAL KNEE ARTHROPLASTY Left 07/20/2020   Procedure: TOTAL KNEE ARTHROPLASTY;  Surgeon: Ernie Cough, MD;  Location: [facility];  Service: Orthopedics;  Laterality: Left;   TUBAL LIGATION     UPPER GI ENDOSCOPY     WRIST SURGERY Left    TFCC (tendon repair)   Patient Active Problem List   Diagnosis Date Noted   Acute respiratory failure with hypoxia (HCC) 11/14/2021   Elevated LFTs    Influenza A 11/13/2021   S/P lumbar fusion 01/26/2021   Encounter for orthopedic follow-up care 10/25/2020   Pain in left knee 08/17/2020   Colitis presumed infectious 02/17/2020   Lesion of liver 02/17/2020   History of total knee arthroplasty 02/10/2020   Osteoarthritis of right knee 10/30/2019   Osteoarthritis of left knee 10/30/2019    Internal derangement of left knee 07/07/2019   Bilateral knee pain 03/21/2019   Low back pain 11/27/2018   Complication of surgical procedure 06/17/2018   Degeneration of lumbar intervertebral disc 06/17/2018   Degenerative spondylolisthesis 06/17/2018   Acute coronary syndrome (HCC) 04/02/2018   Hyperlipidemia 08/06/2017   Influenza due to influenza A virus 01/01/2017   COPD with acute exacerbation (HCC) 01/01/2017   Acute renal failure syndrome (HCC) 01/01/2017  Dehydration 01/01/2017   Preinfarction syndrome (HCC) 10/14/2016   Chest pain 08/22/2016   Supraventricular tachycardia (HCC) 08/22/2016   Obstructive sleep apnea syndrome    Hypertensive disorder    Gastroesophageal reflux disease    Depressive disorder    Coronary arteriosclerosis    Anxiety    Pain in the chest    Hypokalemia    Carpal tunnel syndrome of left wrist 02/18/2016   Leukocytosis 05/26/2014   Tobacco user 05/26/2014   Urge incontinence of urine 12/14/2011    PCP: Laurette Hint NP  REFERRING PROVIDER: Dr Donaciano Sprang  REFERRING DIAG: M54.51 (ICD-10-CM) - Vertebrogenic low back pain   Rationale for Evaluation and Treatment: Rehabilitation  THERAPY DIAG:  Other low back pain  Muscle weakness (generalized)  Difficulty in walking, not elsewhere classified  ONSET DATE: > 1 yr  SUBJECTIVE:                                                                                                                                                                                           SUBJECTIVE STATEMENT: The patient fell last week going up steps. She bruised both her knees. She caught her toe on the step. She is feeling rpetty good today. Yesterday she was a little sore.   Eval:  Fusion 2022 L3-S1 Dr Sprang. Only got a little relief from the surgery. Pain has been ongoing for years. Has had PT in past but can't say if it has helped due to all of her other pain. Bilat TKR 2021, Shoulder pain L>R. Right foot  pain due to OA, had surgery to clean it out and put a plate in April 7975. Pt active in home unpacking from move in Aug, also takes wood working classes. Legs will get week when standing and walking needing a 5 minute seated rest period. Pt reports she pushes through the pain often.  Balance is off.  Bladder stimulator placed to control bladder, pt reports stool incontinence.  PERTINENT HISTORY:  COPD  PAIN:  Are you having pain? Yes: NPRS scale: 7-8this morning  Pain location: L lumbar spine Pain description: catches ; ache Aggravating factors: house hold activity standing > 5 minutes, lifting Relieving factors: sitting, tylenol   PRECAUTIONS: None  RED FLAGS: None   WEIGHT BEARING RESTRICTIONS: No  FALLS:  Has patient fallen in last 6 months? Yes. Number of falls 2 Fell backward after returning form hospital due to breathing problems.  LIVING ENVIRONMENT: Lives with: lives alone Lives in: House/apartment Stairs: Yes: External: 6 steps; on right going up Has following equipment at home: Single point cane and Walker - 2 wheeled  OCCUPATION: retired  disable due to back x 15 yrs  PLOF: Independent  PATIENT GOALS: pain relief, decrease pain with walking  NEXT MD VISIT: as needed  OBJECTIVE:  Note: Objective measures were completed at Evaluation unless otherwise noted.  DIAGNOSTIC FINDINGS:  CT Lumbar IMPRESSION: 1. 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 2. L2-3 moderate spinal stenosis from adjacent segment degeneration.  PATIENT SURVEYS:  FOTO Risk adjusted 32%; Primary score 40% with goal of 42%   COGNITION: Overall cognitive status: Within functional limits for tasks assessed     SENSATION: Paresthesias bil LE to feet  MUSCLE LENGTH: Hamstrings: Limited bilaterally tested in sitting   POSTURE: rounded shoulders, forward head, decreased lumbar lordosis, and flexed trunk   PALPATION: TTP  throughout lumbar up to mid thoracic paraspinals and bilat glutes, significant ms tightness  LUMBAR ROM:   AROM eval  Flexion FT to patella  Extension neutral  Right lateral flexion 75% limited  Left lateral flexion 90% limited  Right rotation   Left rotation    (Blank rows = not tested)  LOWER EXTREMITY ROM:     Active  Right eval Left eval  Hip flexion 95 95  Hip extension ~-10 ~-10  Hip abduction    Hip adduction    Hip internal rotation    Hip external rotation    Knee flexion    Knee extension    Ankle dorsiflexion    Ankle plantarflexion    Ankle inversion    Ankle eversion     (Blank rows = not tested)  LOWER EXTREMITY MMT:    MMT Right eval Left eval  Hip flexion 31.8 32.4  Hip extension    Hip abduction 26.6 26.3  Hip adduction    Hip internal rotation    Hip external rotation    Knee flexion    Knee extension 40.1 47.8  Ankle dorsiflexion    Ankle plantarflexion    Ankle inversion    Ankle eversion     (Blank rows = not tested)  LUMBAR SPECIAL TESTS:  Straight leg raise test: Positive and Slump test: Negative  FUNCTIONAL TESTS:   Timed up and go (TUG): 12.39 4 stage balance: NBOS  GAIT: Distance walked: 300 ft Assistive device utilized:none used today has: Single point cane and Walker - 4 wheeled Level of assistance: Modified independence Comments: forward trunk flexed position, decreased stance phase lle, antalgic and guarded  TODAY'S TREATMENT:                                                                                                                              Eval 1/9 Nu-step 5 min with cuing how to set up   Bilateral er 2x10 yellow  Bilateral horizontal abduction 2x10 yellow  Bilateral flexion  2x10 yellow band   Seated LAQ 90-45 2x10 each leg  Ball squeeze 2x10 each leg   Reviewed breathing and posture for all exercises  Updated HEP     12/26 Manaul: trigger point release to left lumbar paraspinal and  gluteals. Reviewe of trigger points with patient. Patient shown self trigger point release techniques using thera-cane and tennis ball.   Supine:  LTR X15 WITH CUING FOR RANGE   Seated: Forward flexion stretch 5x 5 sec hold  Lateral flexion stretch for left 5x 5 sec hold with min cuing Press with TA breathing   Reviwwed anatomy of TA breathing and paraspinal involvement in stenosis  Given HEP and reviewed use of Medbr[employer]     PATIENT EDUCATION:  Education details: TA breathing; anatomy of stenosis  Person educated: Patient Education method: Explanation Education comprehension: verbalized understanding  HOME EXERCISE PROGRAM:  Access Code: [email or web address] URL: [email or web address] Date: 12/13/2023 Prepared by: Alm Don  Exercises - Supine Lower Trunk Rotation  - 1 x daily - 7 x weekly - 3 sets - 10 reps - Seated Bilateral Shoulder Flexion Towel Slide at Table Top  - 1 x daily - 7 x weekly - 3 sets - 10 reps - Standing Glute Med Mobilization with Small Ball on Wall  - 1 x daily - 7 x weekly - 3 sets - 10 reps - Theracane Over Shoulder  - 1 x daily - 7 x weekly - 3 sets - 10 reps - Supine Transversus Abdominis Bracing - Hands on Thighs  - 1 x daily - 7 x weekly - 3 sets - 7-8 reps ASSESSMENT:  CLINICAL IMPRESSION: Therapy continues to expand the patients exercises. We updated her HEP. We discussed how to use stretching vs exercises for home. She was given an UE and LE series for home. Therapy willp perform a re-assessment on the patient next visit. We will review and update POC  Eval: Patient is a 68 y.o. f who was seen today for physical therapy evaluation and treatment for LBP. She has extensive lumbar dysfunction in addition to fusion a few years ago.  Her pain sensitivity and paresthesias are consistent with her dx.  She has limited lumbar ROM and significant muscle tightness and weakness throughout her posterior core and hip flexors. She reports muscle weakness  in LE with standing and walking > 5 minutes. She remains active working in her home and taking wood working classes despite limitations reporting she takes a lot of rest periods.  Unfortunately she is not a candidate for aquatic intervention due to both bladder and bowel incontinence.  She will benefit from land based PT to improve core and LE strength, ROM, decrease tightness and reduce pain to improve pt functional ability and QOL  OBJECTIVE IMPAIRMENTS: Abnormal gait, cardiopulmonary status limiting activity, decreased activity tolerance, decreased balance, decreased endurance, decreased knowledge of condition, decreased knowledge of use of DME, decreased mobility, difficulty walking, decreased ROM, decreased strength, impaired flexibility, improper body mechanics, postural dysfunction, obesity, and pain.   ACTIVITY LIMITATIONS: carrying, lifting, bending, standing, squatting, stairs, and locomotion level  PARTICIPATION LIMITATIONS: shopping, community activity, and occupation  PERSONAL FACTORS: Behavior pattern, Fitness, Past/current experiences, Time since onset of injury/illness/exacerbation, and 3+ comorbidities: see problem list  are also affecting patient's functional outcome.   REHAB POTENTIAL: Good  CLINICAL DECISION MAKING: Evolving/moderate complexity  EVALUATION COMPLEXITY: Moderate   GOALS: Goals reviewed with patient? Yes  SHORT TERM GOALS: Target date: 12/22/23  Pt will be indep and compliant with initial HEP  Baseline: Goal status: INITIAL    LONG TERM GOALS: Target date: 01/18/24  Pt to meet stated Foto Goal of 42% Baseline: 40 primary  measure Goal status: INITIAL  2.  Pt will improve strength in all areas listed by at least 10 lbs to demonstrate improved overall physical function  Baseline:  Goal status: INITIAL  3.  Pt will amb community distances with ad as needed without being limited by back pain Baseline: no ad limited by 200 ft Goal status: INITIAL  4.   Pt will improve lumbar ROM by 50%. Baseline: see chart Goal status: INITIAL  5.  Pt will tolerate standing/walking completing house hold chores for up towards 15 minutes before needing a seated rest period Baseline: 5 min Goal status: INITIAL  6.  Pt will be indep with final HEP for continued management of condition Baseline: none Goal status: INITIAL  PLAN:  PT FREQUENCY: 1-2x/week  PT DURATION: 8 weeks  PLANNED INTERVENTIONS: 97164- PT Re-evaluation, 97110-Therapeutic exercises, 97530- Therapeutic activity, 97112- Neuromuscular re-education, 97535- Self Care, [id number]- Manual therapy, [phone]- Gait training, [phone]- Orthotic Fit/training, [phone]- Aquatic Therapy, [phone]- Ionotophoresis 4mg /ml Dexamethasone , Patient/Family education, Balance training, Stair training, Taping, Dry Needling, Joint mobilization, DME instructions, Cryotherapy, and Moist heat.  PLAN FOR NEXT SESSION: Land based: consider DN; LE and core strengthening, ROM, balance and gait retraining, postural training; pain management   Alm JINNY Don, PT [place]) Ziemba MPT 12/27/23 11:17 AM [facility] [address] [place], [place], [address] Phone: [phone]   Fax:  [phone]

## 2023-12-27 ENCOUNTER — Encounter (HOSPITAL_BASED_OUTPATIENT_CLINIC_OR_DEPARTMENT_OTHER): Payer: Self-pay | Admitting: Physical Therapy

## 2024-01-01 ENCOUNTER — Encounter (HOSPITAL_BASED_OUTPATIENT_CLINIC_OR_DEPARTMENT_OTHER): Payer: Self-pay | Admitting: Physical Therapy

## 2024-01-01 ENCOUNTER — Ambulatory Visit (HOSPITAL_BASED_OUTPATIENT_CLINIC_OR_DEPARTMENT_OTHER): Payer: Medicare Other | Admitting: Physical Therapy

## 2024-01-01 DIAGNOSIS — M6281 Muscle weakness (generalized): Secondary | ICD-10-CM

## 2024-01-01 DIAGNOSIS — M5459 Other low back pain: Secondary | ICD-10-CM

## 2024-01-01 DIAGNOSIS — R262 Difficulty in walking, not elsewhere classified: Secondary | ICD-10-CM

## 2024-01-01 NOTE — Therapy (Addendum)
 OUTPATIENT PHYSICAL THERAPY THORACOLUMBAR Discharge    Patient Name: [name] MRN: [id number] DOB:1956-11-17, 68 y.o., female Today's Date: 01/01/2024  END OF SESSION:  PT End of Session - 01/01/24 1434     Visit Number 4    Number of Visits 16    Date for PT Re-Evaluation 01/18/24    PT Start Time 1430    PT Stop Time 1512    PT Time Calculation (min) 42 min    Activity Tolerance Patient tolerated treatment well    Behavior During Therapy WFL for tasks assessed/performed               Past Medical History:  Diagnosis Date   Acute coronary syndrome (HCC)    Acute exacerbation of chronic obstructive airways disease (HCC)    Acute renal failure syndrome (HCC)    Agoraphobia without history of panic disorder    Anginal pain (HCC)    Anxiety    Arthritis    "qwhere" (04/03/2018)   Bilateral carpal tunnel syndrome 02/18/2016   Bilateral knee pain    Carpal tunnel syndrome of left wrist    Chest pain    Childhood asthma    Chronic bronchitis (HCC)    Chronic lower back pain    Colitis presumed infectious    COPD (chronic obstructive pulmonary disease) (HCC)    Coronary artery disease CARDIOLOGIST- DR  Sharyn Deforest  (VISIT 03-30-11 W/ CHART)   NON-OBS. CAD   (STRESS TEST NOV. 2011   Depression    DJD (degenerative joint disease)    JOINT PAIN   Dyspnea    "anytime but mostly on exertion and smoking"   Dysrhythmia 2017   SVT   Elevated liver enzymes    Fibromyalgia    Frequency of urination    GERD (gastroesophageal reflux disease)     W/ NEXIUM   Heart murmur    Hemorrhoids    High cholesterol    History of blood transfusion    "related to anemia" (04/03/2018)   History of carpal tunnel syndrome    Right   History of gastric ulcer 2004   Hyperlipidemia    Hypertension    IBS (irritable bowel syndrome)    Incisional pain s/p interstim implant 1st stage--- 12-07-11    left upper buttock-- pt states dressing clean dry and intact (on 12-08-11)   Influenza  due to influenza A virus    Insomnia    Internal derangement of left knee    Iron deficiency anemia 2004   Lesion of liver    Leukocytosis    Lumbar pain    Nocturia    Non-productive cough    Numbness in both hands AT TIMES   OSA (obstructive sleep apnea)    "suppose to wear mask; I don't" (04/03/2018)   Osteoarthritis of left knee    PONV (postoperative nausea and vomiting)    Post laminectomy syndrome    Preinfarction syndrome (HCC)    Supraventricular tachycardia (HCC)    SVT (supraventricular tachycardia) (HCC)    Tobacco user    Urge urinary incontinence    Past Surgical History:  Procedure Laterality Date   ABDOMINAL EXPOSURE N/A 01/26/2021   Procedure: ABDOMINAL EXPOSURE;  Surgeon: Mayo Speck, MD;  Location: [facility] OR;  Service: Vascular;  Laterality: N/A;   ANTERIOR CERVICAL DECOMP/DISCECTOMY FUSION  2008   C4 - T1 ("screws from 1st OR came out")   ANTERIOR CERVICAL DECOMP/DISCECTOMY FUSION  2002   C4 - 7  ANTERIOR LATERAL LUMBAR FUSION WITH PERCUTANEOUS SCREW 2 LEVEL Left 11/27/2018   Procedure: XLIF Lumbar 3-5, posterior spinal fusion L3-5;  Surgeon: Mort Ards, MD;  Location: [facility] OR;  Service: Orthopedics;  Laterality: Left;  5.5 hrs for entire procedure   ANTERIOR LUMBAR FUSION N/A 01/26/2021   Procedure: ANTERIOR LUMBAR FUSION LUMBAR FIVE-SACRAL ONE WITH POSTERIOR SPINAL FUSION INTERBODY (EXTENSION OF PREVIOUS FUSION);  Surgeon: Mort Ards, MD;  Location: [facility];  Service: Orthopedics;  Laterality: N/A;  6hrs Dr. Shirley Douglas to do approach Tap Block with exparel    APPENDECTOMY  1982   ARTHRODESIS METATARSALPHALANGEAL JOINT (MTPJ) Right 04/11/2023   Procedure: right 1st metatarsalphalangeal joint fusion;  Surgeon: Ali Ink, MD;  Location: [facility];  Service: Orthopedics;  Laterality: Right;   BACK SURGERY     CARDIAC CATHETERIZATION  2003   CARDIAC CATHETERIZATION N/A 08/23/2016   Procedure: Left Heart Cath and Coronary Angiography;  Surgeon:  Pasqual Bone, MD;  Location: [facility];  Service: Cardiovascular;  Laterality: N/A;   CARPAL TUNNEL RELEASE Right    CATARACT EXTRACTION W/ INTRAOCULAR LENS  IMPLANT, BILATERAL  2016-2018   CHOLECYSTECTOMY OPEN  1982   COLONOSCOPY     CYSTO/ HOD/ BLADDER BX  2006   CYSTO/ HOD/ BLADDER BX/ FULGERATION  06-12-2011   EYE SURGERY     FINGER SURGERY Right 2001   "replaced worn cartilage on thumb w/tendons"   HYSTEROSCOPY WITH D & C  2005   INTERSTIM IMPLANT PLACEMENT  12/07/2011   Procedure: Simona [place] IMPLANT FIRST STAGE;  Surgeon: Devorah Fonder, MD;  Location: [facility];  Service: Urology;  Laterality: Right;   INTERSTIM IMPLANT PLACEMENT  12/14/2011   Procedure: Simona [place] IMPLANT SECOND STAGE;  Surgeon: Devorah Fonder, MD;  Location: [facility];  Service: Urology;  Laterality: Right;  rad tech ok by vickie at main   JOINT REPLACEMENT     LUMBAR LAMINECTOMY  2007   L3 - 5   TONSILLECTOMY AND ADENOIDECTOMY  1965   TOTAL KNEE ARTHROPLASTY Right 02/10/2020   Procedure: TOTAL KNEE ARTHROPLASTY;  Surgeon: Claiborne Crew, MD;  Location: [facility];  Service: Orthopedics;  Laterality: Right;  70 mins   TOTAL KNEE ARTHROPLASTY Left 07/20/2020   Procedure: TOTAL KNEE ARTHROPLASTY;  Surgeon: Claiborne Crew, MD;  Location: [facility];  Service: Orthopedics;  Laterality: Left;   TUBAL LIGATION     UPPER GI ENDOSCOPY     WRIST SURGERY Left    TFCC ("tendon repair")   Patient Active Problem List   Diagnosis Date Noted   Acute respiratory failure with hypoxia (HCC) 11/14/2021   Elevated LFTs    Influenza A 11/13/2021   S/P lumbar fusion 01/26/2021   Encounter for orthopedic follow-up care 10/25/2020   Pain in left knee 08/17/2020   Colitis presumed infectious 02/17/2020   Lesion of liver 02/17/2020   History of total knee arthroplasty 02/10/2020   Osteoarthritis of right knee 10/30/2019   Osteoarthritis of left knee 10/30/2019   Internal derangement of left  knee 07/07/2019   Bilateral knee pain 03/21/2019   Low back pain 11/27/2018   Complication of surgical procedure 06/17/2018   Degeneration of lumbar intervertebral disc 06/17/2018   Degenerative spondylolisthesis 06/17/2018   Acute coronary syndrome (HCC) 04/02/2018   Hyperlipidemia 08/06/2017   Influenza due to influenza A virus 01/01/2017   COPD with acute exacerbation (HCC) 01/01/2017   Acute renal failure syndrome (HCC) 01/01/2017   Dehydration 01/01/2017   Preinfarction  syndrome (HCC) 10/14/2016   Chest pain 08/22/2016   Supraventricular tachycardia (HCC) 08/22/2016   Obstructive sleep apnea syndrome    Hypertensive disorder    Gastroesophageal reflux disease    Depressive disorder    Coronary arteriosclerosis    Anxiety    Pain in the chest    Hypokalemia    Carpal tunnel syndrome of left wrist 02/18/2016   Leukocytosis 05/26/2014   Tobacco user 05/26/2014   Urge incontinence of urine 12/14/2011    PCP: Jory Ng NP  REFERRING PROVIDER: Dr Mort Ards  REFERRING DIAG: M54.51 (ICD-10-CM) - Vertebrogenic low back pain   Rationale for Evaluation and Treatment: Rehabilitation  THERAPY DIAG:  Other low back pain  Muscle weakness (generalized)  Difficulty in walking, not elsewhere classified  ONSET DATE: > 1 yr  SUBJECTIVE:                                                                                                                                                                                           SUBJECTIVE STATEMENT: Patient reports her back is about the same.  She is having some soreness today. Eval:  Fusion 2022 L3-S1 Dr Vaughn Georges. Only got a little relief from the surgery. Pain has been ongoing for years. Has had PT in past but can't say if it has helped due to all of her other pain. Bilat TKR 2021, Shoulder pain L>R. Right foot pain due to OA, had surgery to clean it out and put a plate in April 1324. Pt active in home unpacking from move in Aug,  also takes wood working classes. Legs will get week when standing and walking needing a 5 minute seated rest period. Pt reports she pushes through the pain often.  "Balance is off".  Bladder stimulator placed to control bladder, pt reports stool incontinence.  PERTINENT HISTORY:  COPD  PAIN:  Are you having pain? Yes: NPRS scale: 6 out of 10 lower back Pain location: L lumbar spine Pain description: catches ; ache Aggravating factors: house hold activity standing > 5 minutes, lifting Relieving factors: sitting, tylenol   PRECAUTIONS: None  RED FLAGS: None   WEIGHT BEARING RESTRICTIONS: No  FALLS:  Has patient fallen in last 6 months? Yes. Number of falls 2 Fell backward after returning form hospital due to breathing problems.  LIVING ENVIRONMENT: Lives with: lives alone Lives in: House/apartment Stairs: Yes: External: 6 steps; on right going up Has following equipment at home: Single point cane and Walker - 2 wheeled  OCCUPATION: retired disable due to back x 15 yrs  PLOF: Independent  PATIENT GOALS: pain relief, decrease pain with walking  NEXT  MD VISIT: as needed  OBJECTIVE:  Note: Objective measures were completed at Evaluation unless otherwise noted.  DIAGNOSTIC FINDINGS:  CT Lumbar IMPRESSION: 1. 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 2. L2-3 moderate spinal stenosis from adjacent segment degeneration.  PATIENT SURVEYS:  FOTO Risk adjusted 32%; Primary score 40% with goal of 42%   COGNITION: Overall cognitive status: Within functional limits for tasks assessed     SENSATION: Paresthesias bil LE to feet  MUSCLE LENGTH: Hamstrings: Limited bilaterally tested in sitting   POSTURE: rounded shoulders, forward head, decreased lumbar lordosis, and flexed trunk   PALPATION: TTP throughout lumbar up to mid thoracic paraspinals and bilat glutes, significant ms tightness  LUMBAR ROM:    AROM eval  Flexion FT to patella  Extension neutral  Right lateral flexion 75% limited  Left lateral flexion 90% limited  Right rotation   Left rotation    (Blank rows = not tested)  LOWER EXTREMITY ROM:     Active  Right eval Left eval  Hip flexion 95 95  Hip extension ~-10 ~-10  Hip abduction    Hip adduction    Hip internal rotation    Hip external rotation    Knee flexion    Knee extension    Ankle dorsiflexion    Ankle plantarflexion    Ankle inversion    Ankle eversion     (Blank rows = not tested)  LOWER EXTREMITY MMT:    MMT Right eval Left eval  Hip flexion 31.8 32.4  Hip extension    Hip abduction 26.6 26.3  Hip adduction    Hip internal rotation    Hip external rotation    Knee flexion    Knee extension 40.1 47.8  Ankle dorsiflexion    Ankle plantarflexion    Ankle inversion    Ankle eversion     (Blank rows = not tested)  LUMBAR SPECIAL TESTS:  Straight leg raise test: Positive and Slump test: Negative  FUNCTIONAL TESTS:   Timed up and go (TUG): 12.39 4 stage balance: NBOS  GAIT: Distance walked: 300 ft Assistive device utilized:none used today has: Single point cane and Walker - 4 wheeled Level of assistance: Modified independence Comments: forward trunk flexed position, decreased stance phase lle, antalgic and guarded  TODAY'S TREATMENT:                                                                                                                              1/14  Reviewed HEP and how to use  Reviewed FOTO score   Ball roll x5  Fwd and lateral each way   Scap retraction x10 red  Shoulder extnesion x10 red      1/9 Nu-step 5 min with cuing how to set up   Bilateral er 2x10 yellow  Bilateral horizontal abduction 2x10 yellow  Bilateral flexion  2x10 yellow band   Seated LAQ 90-45 2x10  each leg  Ball squeeze 2x10 each leg   Reviewed breathing and posture for all exercises   Updated HEP     12/26 [place]aul:  trigger point release to left lumbar paraspinal and gluteals. Reviewe of trigger points with patient. Patient shown self trigger point release techniques using thera-cane and tennis ball.   Supine:  LTR X15 WITH CUING FOR RANGE   Seated: Forward flexion stretch 5x 5 sec hold  Lateral flexion stretch for left 5x 5 sec hold with min cuing Press with TA breathing   Reviwwed anatomy of TA breathing and paraspinal involvement in stenosis  Given HEP and reviewed use of Medbrideg     PATIENT EDUCATION:  Education details: TA breathing; anatomy of stenosis  Person educated: Patient Education method: Explanation Education comprehension: verbalized understanding  HOME EXERCISE PROGRAM:  Access Code: [email or web address] URL: [email or web address] Date: 12/13/2023 Prepared by: Signa Drier  Exercises - Supine Lower Trunk Rotation  - 1 x daily - 7 x weekly - 3 sets - 10 reps - Seated Bilateral Shoulder Flexion Towel Slide at Table Top  - 1 x daily - 7 x weekly - 3 sets - 10 reps - Standing Glute Med Mobilization with Small Ball on Wall  - 1 x daily - 7 x weekly - 3 sets - 10 reps - Theracane Over Shoulder  - 1 x daily - 7 x weekly - 3 sets - 10 reps - Supine Transversus Abdominis Bracing - Hands on Thighs  - 1 x daily - 7 x weekly - 3 sets - 7-8 reps ASSESSMENT:  CLINICAL IMPRESSION: The patient tolerated treatment well.  We reviewed her HEP and how to use it.  She is advised to use her stretches and soft tissue mobilization when she is in pain.  She she was advised to try to find a consistent routine with her exercises.  We reviewed upper body and lower body exercises that she can do depending on how she feels and which exercises she feels are more effective.  We also reviewed how to progress her exercises.  We added upper extremity rows and shoulder extensions today.  We reviewed her Foto today.  She has not demonstrated any functional improvement yet but she is only on her fourth  visit.  She was advised we will likely take time to build the muscle required to improve her overall function.  She feels like she has enough things to work on on her own.  We will keep her chart open for 2 to 4 weeks.  If she feels like she has any kind of acute exacerbation of her symptoms we can perform manual therapy and review her HEP.  She will otherwise work on her own exercises at home.See below for goal specific progress   Eval: Patient is a 68 y.o. f who was seen today for physical therapy evaluation and treatment for LBP. She has extensive lumbar dysfunction in addition to fusion a few years ago.  Her pain sensitivity and paresthesias are consistent with her dx.  She has limited lumbar ROM and significant muscle tightness and weakness throughout her posterior core and hip flexors. She reports muscle weakness in LE with standing and walking > 5 minutes. She remains active working in her home and taking wood working classes despite limitations reporting she takes a lot of rest periods.  Unfortunately she is not a candidate for aquatic intervention due to both bladder and bowel incontinence.  She will benefit from land based PT to improve core  and LE strength, ROM, decrease tightness and reduce pain to improve pt functional ability and QOL  OBJECTIVE IMPAIRMENTS: Abnormal gait, cardiopulmonary status limiting activity, decreased activity tolerance, decreased balance, decreased endurance, decreased knowledge of condition, decreased knowledge of use of DME, decreased mobility, difficulty walking, decreased ROM, decreased strength, impaired flexibility, improper body mechanics, postural dysfunction, obesity, and pain.   ACTIVITY LIMITATIONS: carrying, lifting, bending, standing, squatting, stairs, and locomotion level  PARTICIPATION LIMITATIONS: shopping, community activity, and occupation  PERSONAL FACTORS: Behavior pattern, Fitness, Past/current experiences, Time since onset of  injury/illness/exacerbation, and 3+ comorbidities: see problem list  are also affecting patient's functional outcome.   REHAB POTENTIAL: Good  CLINICAL DECISION MAKING: Evolving/moderate complexity  EVALUATION COMPLEXITY: Moderate   GOALS: Goals reviewed with patient? Yes  SHORT TERM GOALS: Target date: 12/22/23  Pt will be indep and compliant with initial HEP  Baseline: Goal status: Achieved has base HEP 1/14    LONG TERM GOALS: Target date: 01/18/24  Pt to meet stated Foto Goal of 42% Baseline: 40 primary measure Goal status: INITIAL  2.  Pt will improve strength in all areas listed by at least 10 lbs to demonstrate improved overall physical function  Baseline:  Goal status: Not achieved at this time 1/14  3.  Pt will amb community distances with ad as needed without being limited by back pain Baseline: no ad limited by 200 ft Goal status: Continues to be limited 1/14 4.  Pt will improve lumbar ROM by 50%. Baseline: see chart Goal status: INITIAL  5.  Pt will tolerate standing/walking completing house hold chores for up towards 15 minutes before needing a seated rest period Baseline: 5 min Goal status: INITIAL  6.  Pt will be indep with final HEP for continued management of condition Baseline: none Goal status: INITIAL  PLAN:  PT FREQUENCY: 1-2x/week  PT DURATION: 8 weeks  PLANNED INTERVENTIONS: 97164- PT Re-evaluation, 97110-Therapeutic exercises, 97530- Therapeutic activity, 97112- Neuromuscular re-education, 97535- Self Care, 16109- Manual therapy, [phone]- Gait training, [phone]- Orthotic Fit/training, [phone]- Aquatic Therapy, [phone]- Ionotophoresis 4mg /ml Dexamethasone , Patient/Family education, Balance training, Stair training, Taping, Dry Needling, Joint mobilization, DME instructions, Cryotherapy, and Moist heat.  PLAN FOR NEXT SESSION: Land based: consider DN; LE and core strengthening, ROM, balance and gait retraining, postural training; pain  management   Kitty Perkins, PT [place]) Ziemba MPT 01/01/24 2:47 PM [facility] [address] [place], [place], [address] Phone: [phone]   Fax:  [phone]

## 2024-01-17 DIAGNOSIS — Z961 Presence of intraocular lens: Secondary | ICD-10-CM | POA: Diagnosis not present

## 2024-01-17 DIAGNOSIS — H35371 Puckering of macula, right eye: Secondary | ICD-10-CM | POA: Diagnosis not present

## 2024-01-17 DIAGNOSIS — H43813 Vitreous degeneration, bilateral: Secondary | ICD-10-CM | POA: Diagnosis not present

## 2024-02-05 DIAGNOSIS — R194 Change in bowel habit: Secondary | ICD-10-CM | POA: Diagnosis not present

## 2024-02-05 DIAGNOSIS — R140 Abdominal distension (gaseous): Secondary | ICD-10-CM | POA: Diagnosis not present

## 2024-03-24 DIAGNOSIS — I10 Essential (primary) hypertension: Secondary | ICD-10-CM | POA: Diagnosis not present

## 2024-03-24 DIAGNOSIS — J449 Chronic obstructive pulmonary disease, unspecified: Secondary | ICD-10-CM | POA: Diagnosis not present

## 2024-03-24 DIAGNOSIS — R072 Precordial pain: Secondary | ICD-10-CM | POA: Diagnosis not present

## 2024-03-24 DIAGNOSIS — M48061 Spinal stenosis, lumbar region without neurogenic claudication: Secondary | ICD-10-CM | POA: Diagnosis not present

## 2024-03-27 ENCOUNTER — Inpatient Hospital Stay (HOSPITAL_BASED_OUTPATIENT_CLINIC_OR_DEPARTMENT_OTHER): Payer: Medicare Other | Admitting: Oncology

## 2024-03-27 ENCOUNTER — Inpatient Hospital Stay: Payer: Medicare Other | Attending: Oncology

## 2024-03-27 ENCOUNTER — Telehealth: Payer: Self-pay | Admitting: *Deleted

## 2024-03-27 VITALS — BP 151/71 | HR 66 | Temp 98.1°F | Resp 18 | Ht <= 58 in | Wt 171.4 lb

## 2024-03-27 DIAGNOSIS — Z8639 Personal history of other endocrine, nutritional and metabolic disease: Secondary | ICD-10-CM | POA: Insufficient documentation

## 2024-03-27 DIAGNOSIS — I10 Essential (primary) hypertension: Secondary | ICD-10-CM | POA: Insufficient documentation

## 2024-03-27 DIAGNOSIS — D72829 Elevated white blood cell count, unspecified: Secondary | ICD-10-CM | POA: Diagnosis not present

## 2024-03-27 DIAGNOSIS — J449 Chronic obstructive pulmonary disease, unspecified: Secondary | ICD-10-CM | POA: Insufficient documentation

## 2024-03-27 DIAGNOSIS — I2510 Atherosclerotic heart disease of native coronary artery without angina pectoris: Secondary | ICD-10-CM | POA: Insufficient documentation

## 2024-03-27 DIAGNOSIS — D649 Anemia, unspecified: Secondary | ICD-10-CM

## 2024-03-27 DIAGNOSIS — Z79899 Other long term (current) drug therapy: Secondary | ICD-10-CM | POA: Diagnosis not present

## 2024-03-27 DIAGNOSIS — Z87891 Personal history of nicotine dependence: Secondary | ICD-10-CM | POA: Diagnosis not present

## 2024-03-27 LAB — FERRITIN: Ferritin: 91 ng/mL (ref 11–307)

## 2024-03-27 LAB — CBC WITH DIFFERENTIAL (CANCER CENTER ONLY)
Abs Immature Granulocytes: 0.06 10*3/uL (ref 0.00–0.07)
Basophils Absolute: 0.1 10*3/uL (ref 0.0–0.1)
Basophils Relative: 1 %
Eosinophils Absolute: 0.4 10*3/uL (ref 0.0–0.5)
Eosinophils Relative: 3 %
HCT: 42.7 % (ref 36.0–46.0)
Hemoglobin: 13.4 g/dL (ref 12.0–15.0)
Immature Granulocytes: 0 %
Lymphocytes Relative: 12 %
Lymphs Abs: 1.9 10*3/uL (ref 0.7–4.0)
MCH: 27.7 pg (ref 26.0–34.0)
MCHC: 31.4 g/dL (ref 30.0–36.0)
MCV: 88.2 fL (ref 80.0–100.0)
Monocytes Absolute: 0.7 10*3/uL (ref 0.1–1.0)
Monocytes Relative: 5 %
Neutro Abs: 12.4 10*3/uL — ABNORMAL HIGH (ref 1.7–7.7)
Neutrophils Relative %: 79 %
Platelet Count: 263 10*3/uL (ref 150–400)
RBC: 4.84 MIL/uL (ref 3.87–5.11)
RDW: 14.2 % (ref 11.5–15.5)
WBC Count: 15.6 10*3/uL — ABNORMAL HIGH (ref 4.0–10.5)
nRBC: 0 % (ref 0.0–0.2)

## 2024-03-27 NOTE — Telephone Encounter (Signed)
 Notified of normal ferritin level and may decrease iron to daily. Return stool cards as soon as she can.

## 2024-03-27 NOTE — Telephone Encounter (Signed)
-----   Message from Thornton Papas sent at 03/27/2024  2:43 PM EDT ----- Please call patient, the iron level is normal, decrease to once daily, return stool Hemoccult cards, follow-up as scheduled

## 2024-03-27 NOTE — Progress Notes (Signed)
  [facility] OFFICE PROGRESS NOTE   Diagnosis: Leukocytosis, iron deficiency  INTERVAL HISTORY:   [name] returns as scheduled.  Good appetite.  No difficulty with bowel function.  The stool is dark while taking iron.  She has chronic back pain.  She reports seeing Dr. Lorenso Quarry for evaluation of the iron deficiency and Hemoccult positive stool.  She did not undergo an endoscopic evaluation.  She is scheduled to see Dr. Dulce Sellar next month.  Objective:  Vital signs in last 24 hours:  Blood pressure (!) 151/71, pulse 66, temperature 98.1 F (36.7 C), temperature source Temporal, resp. rate 18, height 4\' 10"  (1.473 m), weight 171 lb 6.4 oz (77.7 kg), last menstrual period 09/06/2005, SpO2 95%.     Lymphatics: No cervical, supraclavicular, axillary, or inguinal nodes Resp: Lungs clear bilaterally Cardio: Rate and rhythm GI: Protuberant, no mass, no hepatosplenomegaly Vascular: No leg edema   Lab Results:  Lab Results  Component Value Date   WBC 15.6 (H) 03/27/2024   HGB 13.4 03/27/2024   HCT 42.7 03/27/2024   MCV 88.2 03/27/2024   PLT 263 03/27/2024   NEUTROABS 12.4 (H) 03/27/2024    CMP  Lab Results  Component Value Date   NA 137 09/21/2023   K 4.5 09/21/2023   CL 100 09/21/2023   CO2 29 09/21/2023   GLUCOSE 121 (H) 09/21/2023   BUN 24 (H) 09/21/2023   CREATININE 1.01 (H) 09/21/2023   CALCIUM 9.6 09/21/2023   PROT 7.7 09/21/2023   ALBUMIN 4.3 09/21/2023   AST 45 (H) 09/21/2023   ALT 40 09/21/2023   ALKPHOS 106 09/21/2023   BILITOT 0.5 09/21/2023   GFRNONAA >60 09/21/2023   GFRAA >60 07/21/2020     Medications: I have reviewed the patient's current medications.   Assessment/Plan: Leukocytosis 05/27/2015-BCR/ABL not detected, negative for JAK2 V617F mutation 09/15/2015 bone marrow biopsy-normocellular bone marrow with trilineage hematopoiesis, no definite morphologic evidence of a myeloproliferative or myelodysplastic process, cytogenetic  analysis unremarkable 05/01/2022 advanced NGS JAK2, MPL and CALR normal Hepatomegaly on exam 05/01/2022 05/03/2022 CT abdomen/pelvis-no acute findings.  Stable mild hepatomegaly and diffuse hepatic steatosis.  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  The white count is stable.  She has undergone an extensive negative diagnostic evaluation for leukocytosis.  Iron deficiency anemia has corrected with oral iron therapy.  The stool was Hemoccult positive in December.  She saw Dr. Lorenso Quarry and is scheduled to follow-up with Dr. Dulce Sellar.  She reports last undergoing an endoscopic evaluation in 2004.  She will return another set of stool Hemoccult cards.  We will follow-up on the ferritin today.  She will decrease ferrous sulfate to once daily.  She will return for an office and lab visit in 6 months.    Thornton Papas, MD  03/27/2024  12:25 PM

## 2024-03-28 ENCOUNTER — Telehealth: Payer: Self-pay | Admitting: Oncology

## 2024-03-28 NOTE — Telephone Encounter (Signed)
 Contacted pt to schedule an appt. Unable to reach via phone, voicemail was left.    Follow-up disposition: Return in about 6 months (around 09/26/2024) for Lab, office.  Check out comments: Office and lab 6 months

## 2024-04-01 NOTE — Telephone Encounter (Signed)
 Contacted pt to schedule an appt. Unable to reach via phone, voicemail was left.

## 2024-04-03 ENCOUNTER — Telehealth: Payer: Self-pay | Admitting: Oncology

## 2024-04-03 NOTE — Telephone Encounter (Signed)
 Called to schedule lab/OV per inbasket. LVM to return call for scheduling.

## 2024-04-04 ENCOUNTER — Other Ambulatory Visit: Payer: Self-pay | Admitting: *Deleted

## 2024-04-04 ENCOUNTER — Telehealth: Payer: Self-pay | Admitting: *Deleted

## 2024-04-04 DIAGNOSIS — D649 Anemia, unspecified: Secondary | ICD-10-CM

## 2024-04-04 DIAGNOSIS — J449 Chronic obstructive pulmonary disease, unspecified: Secondary | ICD-10-CM | POA: Diagnosis not present

## 2024-04-04 DIAGNOSIS — Z79899 Other long term (current) drug therapy: Secondary | ICD-10-CM | POA: Diagnosis not present

## 2024-04-04 DIAGNOSIS — Z87891 Personal history of nicotine dependence: Secondary | ICD-10-CM | POA: Diagnosis not present

## 2024-04-04 DIAGNOSIS — I10 Essential (primary) hypertension: Secondary | ICD-10-CM | POA: Diagnosis not present

## 2024-04-04 DIAGNOSIS — I2510 Atherosclerotic heart disease of native coronary artery without angina pectoris: Secondary | ICD-10-CM | POA: Diagnosis not present

## 2024-04-04 DIAGNOSIS — D72829 Elevated white blood cell count, unspecified: Secondary | ICD-10-CM | POA: Diagnosis not present

## 2024-04-04 LAB — OCCULT BLOOD X 1 CARD TO LAB, STOOL
Fecal Occult Bld: NEGATIVE
Fecal Occult Bld: NEGATIVE
Fecal Occult Bld: NEGATIVE

## 2024-04-04 NOTE — Telephone Encounter (Addendum)
-----   Message from Coni Deep sent at 04/04/2024  2:46 PM EDT ----- Please call patient, the stool cards are negative for blood, continue daily iron, follow-up as scheduled

## 2024-04-04 NOTE — Telephone Encounter (Signed)
 Notified of negative stool cards. Continue daily ferrous sulfate  and f/u as scheduled.

## 2024-04-04 NOTE — Progress Notes (Signed)
 Patient brought in completed stool cards today. Sent to lab

## 2024-04-07 DIAGNOSIS — I10 Essential (primary) hypertension: Secondary | ICD-10-CM | POA: Diagnosis not present

## 2024-04-07 DIAGNOSIS — J449 Chronic obstructive pulmonary disease, unspecified: Secondary | ICD-10-CM | POA: Diagnosis not present

## 2024-04-07 DIAGNOSIS — I700 Atherosclerosis of aorta: Secondary | ICD-10-CM | POA: Diagnosis not present

## 2024-04-07 DIAGNOSIS — Z0000 Encounter for general adult medical examination without abnormal findings: Secondary | ICD-10-CM | POA: Diagnosis not present

## 2024-04-07 DIAGNOSIS — G4733 Obstructive sleep apnea (adult) (pediatric): Secondary | ICD-10-CM | POA: Diagnosis not present

## 2024-04-07 DIAGNOSIS — Z1331 Encounter for screening for depression: Secondary | ICD-10-CM | POA: Diagnosis not present

## 2024-04-07 DIAGNOSIS — I2510 Atherosclerotic heart disease of native coronary artery without angina pectoris: Secondary | ICD-10-CM | POA: Diagnosis not present

## 2024-04-07 DIAGNOSIS — R7303 Prediabetes: Secondary | ICD-10-CM | POA: Diagnosis not present

## 2024-04-07 DIAGNOSIS — E1165 Type 2 diabetes mellitus with hyperglycemia: Secondary | ICD-10-CM | POA: Diagnosis not present

## 2024-04-07 DIAGNOSIS — E559 Vitamin D deficiency, unspecified: Secondary | ICD-10-CM | POA: Diagnosis not present

## 2024-04-07 DIAGNOSIS — E782 Mixed hyperlipidemia: Secondary | ICD-10-CM | POA: Diagnosis not present

## 2024-04-07 DIAGNOSIS — D72829 Elevated white blood cell count, unspecified: Secondary | ICD-10-CM | POA: Diagnosis not present

## 2024-04-14 ENCOUNTER — Encounter (HOSPITAL_BASED_OUTPATIENT_CLINIC_OR_DEPARTMENT_OTHER): Payer: Self-pay | Admitting: Physical Therapy

## 2024-04-16 DIAGNOSIS — J441 Chronic obstructive pulmonary disease with (acute) exacerbation: Secondary | ICD-10-CM | POA: Diagnosis not present

## 2024-04-16 DIAGNOSIS — E782 Mixed hyperlipidemia: Secondary | ICD-10-CM | POA: Diagnosis not present

## 2024-04-16 DIAGNOSIS — I10 Essential (primary) hypertension: Secondary | ICD-10-CM | POA: Diagnosis not present

## 2024-04-16 DIAGNOSIS — J449 Chronic obstructive pulmonary disease, unspecified: Secondary | ICD-10-CM | POA: Diagnosis not present

## 2024-04-24 DIAGNOSIS — Z961 Presence of intraocular lens: Secondary | ICD-10-CM | POA: Diagnosis not present

## 2024-04-24 DIAGNOSIS — H04123 Dry eye syndrome of bilateral lacrimal glands: Secondary | ICD-10-CM | POA: Diagnosis not present

## 2024-04-24 DIAGNOSIS — H43391 Other vitreous opacities, right eye: Secondary | ICD-10-CM | POA: Diagnosis not present

## 2024-04-24 DIAGNOSIS — H43813 Vitreous degeneration, bilateral: Secondary | ICD-10-CM | POA: Diagnosis not present

## 2024-04-24 DIAGNOSIS — H35371 Puckering of macula, right eye: Secondary | ICD-10-CM | POA: Diagnosis not present

## 2024-04-29 DIAGNOSIS — R072 Precordial pain: Secondary | ICD-10-CM | POA: Diagnosis not present

## 2024-04-29 DIAGNOSIS — I425 Other restrictive cardiomyopathy: Secondary | ICD-10-CM | POA: Diagnosis not present

## 2024-05-07 DIAGNOSIS — K639 Disease of intestine, unspecified: Secondary | ICD-10-CM | POA: Diagnosis not present

## 2024-05-07 DIAGNOSIS — R140 Abdominal distension (gaseous): Secondary | ICD-10-CM | POA: Diagnosis not present

## 2024-05-07 DIAGNOSIS — D649 Anemia, unspecified: Secondary | ICD-10-CM | POA: Diagnosis not present

## 2024-05-14 DIAGNOSIS — K639 Disease of intestine, unspecified: Secondary | ICD-10-CM | POA: Diagnosis not present

## 2024-05-17 DIAGNOSIS — J449 Chronic obstructive pulmonary disease, unspecified: Secondary | ICD-10-CM | POA: Diagnosis not present

## 2024-05-17 DIAGNOSIS — E782 Mixed hyperlipidemia: Secondary | ICD-10-CM | POA: Diagnosis not present

## 2024-05-17 DIAGNOSIS — I10 Essential (primary) hypertension: Secondary | ICD-10-CM | POA: Diagnosis not present

## 2024-05-17 DIAGNOSIS — J441 Chronic obstructive pulmonary disease with (acute) exacerbation: Secondary | ICD-10-CM | POA: Diagnosis not present

## 2024-06-05 ENCOUNTER — Other Ambulatory Visit: Payer: Self-pay

## 2024-06-05 DIAGNOSIS — I6529 Occlusion and stenosis of unspecified carotid artery: Secondary | ICD-10-CM

## 2024-06-11 DIAGNOSIS — R1013 Epigastric pain: Secondary | ICD-10-CM | POA: Diagnosis not present

## 2024-06-11 DIAGNOSIS — K5730 Diverticulosis of large intestine without perforation or abscess without bleeding: Secondary | ICD-10-CM | POA: Diagnosis not present

## 2024-06-11 DIAGNOSIS — R140 Abdominal distension (gaseous): Secondary | ICD-10-CM | POA: Diagnosis not present

## 2024-06-11 DIAGNOSIS — K644 Residual hemorrhoidal skin tags: Secondary | ICD-10-CM | POA: Diagnosis not present

## 2024-06-11 DIAGNOSIS — K317 Polyp of stomach and duodenum: Secondary | ICD-10-CM | POA: Diagnosis not present

## 2024-06-11 DIAGNOSIS — D175 Benign lipomatous neoplasm of intra-abdominal organs: Secondary | ICD-10-CM | POA: Diagnosis not present

## 2024-06-11 DIAGNOSIS — K2930 Chronic superficial gastritis without bleeding: Secondary | ICD-10-CM | POA: Diagnosis not present

## 2024-06-11 DIAGNOSIS — Z1211 Encounter for screening for malignant neoplasm of colon: Secondary | ICD-10-CM | POA: Diagnosis not present

## 2024-06-11 DIAGNOSIS — R194 Change in bowel habit: Secondary | ICD-10-CM | POA: Diagnosis not present

## 2024-06-11 DIAGNOSIS — K2970 Gastritis, unspecified, without bleeding: Secondary | ICD-10-CM | POA: Diagnosis not present

## 2024-06-13 DIAGNOSIS — K2930 Chronic superficial gastritis without bleeding: Secondary | ICD-10-CM | POA: Diagnosis not present

## 2024-06-16 NOTE — Progress Notes (Unsigned)
 Office Note     CC:  follow up Requesting Provider:  Dyane Anthony RAMAN, FNP  HPI: [name] is a 68 y.o. (December 08, 1956) female who presents for routine follow up of carotid stenosis. She had carotid bruit heard on examination prior to undergoing back surgery in 2022 and was sent to us  for evaluation. She has had 1-39% ICA stenosis bilaterally on duplex examination. She has no history of TIA or stroke.   Today she denies any visual changes, slurred speech, facial drooping, unilateral upper or lower extremity weakness or numbness. She says she does have chronic back pain so if she exerts herself it causes increased pain and weakness in her legs. Otherwise she denies any claudication symptoms. No rest pain or tissue loss. She also has COPD so she cannot exert herself too much of she gets short winded.  The pt is not on a statin for cholesterol management due to intolerance with myalgias The pt is on a daily aspirin .   Other AC:  none The pt is on CCB, hydralazine  for hypertension.   The pt is not on medication for diabetes Tobacco hx:  former  Past Medical History:  Diagnosis Date   Acute coronary syndrome (HCC)    Acute exacerbation of chronic obstructive airways disease (HCC)    Acute renal failure syndrome (HCC)    Agoraphobia without history of panic disorder    Anginal pain (HCC)    Anxiety    Arthritis    qwhere (04/03/2018)   Bilateral carpal tunnel syndrome 02/18/2016   Bilateral knee pain    Carpal tunnel syndrome of left wrist    Chest pain    Childhood asthma    Chronic bronchitis (HCC)    Chronic lower back pain    Colitis presumed infectious    COPD (chronic obstructive pulmonary disease) (HCC)    Coronary artery disease CARDIOLOGIST- DR  CLAUDENE  (VISIT 03-30-11 W/ CHART)   NON-OBS. CAD   (STRESS TEST NOV. 2011   Depression    DJD (degenerative joint disease)    JOINT PAIN   Dyspnea    anytime but mostly on exertion and smoking   Dysrhythmia 2017   SVT    Elevated liver enzymes    Fibromyalgia    Frequency of urination    GERD (gastroesophageal reflux disease)     W/ NEXIUM   Heart murmur    Hemorrhoids    High cholesterol    History of blood transfusion    related to anemia (04/03/2018)   History of carpal tunnel syndrome    Right   History of gastric ulcer 2004   Hyperlipidemia    Hypertension    IBS (irritable bowel syndrome)    Incisional pain s/p interstim implant 1st stage--- 12-07-11    left upper buttock-- pt states dressing clean dry and intact (on 12-08-11)   Influenza due to influenza A virus    Insomnia    Internal derangement of left knee    Iron deficiency anemia 2004   Lesion of liver    Leukocytosis    Lumbar pain    Nocturia    Non-productive cough    Numbness in both hands AT TIMES   OSA (obstructive sleep apnea)    suppose to wear mask; I don't (04/03/2018)   Osteoarthritis of left knee    PONV (postoperative nausea and vomiting)    Post laminectomy syndrome    Preinfarction syndrome (HCC)    Supraventricular tachycardia (HCC)  SVT (supraventricular tachycardia) (HCC)    Tobacco user    Urge urinary incontinence     Past Surgical History:  Procedure Laterality Date   ABDOMINAL EXPOSURE N/A 01/26/2021   Procedure: ABDOMINAL EXPOSURE;  Surgeon: Oris Krystal FALCON, MD;  Location: [facility] OR;  Service: Vascular;  Laterality: N/A;   ANTERIOR CERVICAL DECOMP/DISCECTOMY FUSION  2008   C4 - T1 (screws from 1st OR came out)   ANTERIOR CERVICAL DECOMP/DISCECTOMY FUSION  2002   C4 - 7   ANTERIOR LATERAL LUMBAR FUSION WITH PERCUTANEOUS SCREW 2 LEVEL Left 11/27/2018   Procedure: XLIF Lumbar 3-5, posterior spinal fusion L3-5;  Surgeon: Burnetta Aures, MD;  Location: [facility] OR;  Service: Orthopedics;  Laterality: Left;  5.5 hrs for entire procedure   ANTERIOR LUMBAR FUSION N/A 01/26/2021   Procedure: ANTERIOR LUMBAR FUSION LUMBAR FIVE-SACRAL ONE WITH POSTERIOR SPINAL FUSION INTERBODY (EXTENSION OF PREVIOUS FUSION);  Surgeon:  Burnetta Aures, MD;  Location: [facility];  Service: Orthopedics;  Laterality: N/A;  6hrs Dr. Oris to do approach Tap Block with exparel    APPENDECTOMY  1982   ARTHRODESIS METATARSALPHALANGEAL JOINT (MTPJ) Right 04/11/2023   Procedure: right 1st metatarsalphalangeal joint fusion;  Surgeon: Barton Drape, MD;  Location: [facility];  Service: Orthopedics;  Laterality: Right;   BACK SURGERY     CARDIAC CATHETERIZATION  2003   CARDIAC CATHETERIZATION N/A 08/23/2016   Procedure: Left Heart Cath and Coronary Angiography;  Surgeon: Salena Negri, MD;  Location: [facility];  Service: Cardiovascular;  Laterality: N/A;   CARPAL TUNNEL RELEASE Right    CATARACT EXTRACTION W/ INTRAOCULAR LENS  IMPLANT, BILATERAL  2016-2018   CHOLECYSTECTOMY OPEN  1982   COLONOSCOPY     CYSTO/ HOD/ BLADDER BX  2006   CYSTO/ HOD/ BLADDER BX/ FULGERATION  06-12-2011   EYE SURGERY     FINGER SURGERY Right 2001   replaced worn cartilage on thumb w/tendons   HYSTEROSCOPY WITH D & C  2005   INTERSTIM IMPLANT PLACEMENT  12/07/2011   Procedure: RENNA IMPLANT FIRST STAGE;  Surgeon: Glendia DELENA Elizabeth, MD;  Location: [facility];  Service: Urology;  Laterality: Right;   INTERSTIM IMPLANT PLACEMENT  12/14/2011   Procedure: RENNA IMPLANT SECOND STAGE;  Surgeon: Glendia DELENA Elizabeth, MD;  [facility]: [facility];  Service: Urology;  Laterality: Right;  rad tech ok by vickie at [facility]   JOINT REPLACEMENT     LUMBAR LAMINECTOMY  2007   L3 - 5   TONSILLECTOMY AND ADENOIDECTOMY  1965   TOTAL KNEE ARTHROPLASTY Right 02/10/2020   Procedure: TOTAL KNEE ARTHROPLASTY;  Surgeon: Ernie Cough, MD;  Location: [facility];  Service: Orthopedics;  Laterality: Right;  70 mins   TOTAL KNEE ARTHROPLASTY Left 07/20/2020   Procedure: TOTAL KNEE ARTHROPLASTY;  Surgeon: Ernie Cough, MD;  Location: [facility];  Service: Orthopedics;  Laterality: Left;   TUBAL LIGATION     UPPER GI ENDOSCOPY     WRIST  SURGERY Left    TFCC (tendon repair)    Social History   Socioeconomic History   Marital status: Widowed    Spouse name: Not on file   Number of children: Not on file   Years of education: Not on file   Highest education level: Not on file  Occupational History   Not on file  Tobacco Use   Smoking status: Former    Current packs/day: 0.00    Average packs/day: 2.0 packs/day for 43.0 years (86.0 ttl pk-yrs)  Types: Cigarettes    Start date: 11/13/1978    Quit date: 11/13/2021    Years since quitting: 2.5   Smokeless tobacco: Never  Vaping Use   Vaping status: Never Used  Substance and Sexual Activity   Alcohol  use: Not Currently    Alcohol /week: 0.0 standard drinks of alcohol     Comment: 04/03/2018 nothing in years   Drug use: Not Currently    Types: Marijuana   Sexual activity: Not on file  Other Topics Concern   Not on file  Social History Narrative   Lives alone [place] a [place] with her 2 dogs.   On disability since 2009.  Previously worked as a Designer, industrial/product.    Education: high school   Social Drivers of Health   Financial Resource Strain: High Risk (08/15/2022)   Overall Financial Resource Strain (CARDIA)    Difficulty of Paying Living Expenses: Very hard  Food Insecurity: Food Insecurity Present (08/15/2022)   Hunger Vital Sign    Worried About Running Out of Food in the Last Year: Often true    Ran Out of Food in the Last Year: Not on file  Transportation Needs: No Transportation Needs (08/10/2022)   PRAPARE - Administrator, Civil Service (Medical): No    Lack of Transportation (Non-Medical): No  Physical Activity: Not on file  Stress: Not on file  Social Connections: Not on file  Intimate Partner Violence: Not on file    Family History  Problem Relation Age of Onset   Hypertension Father    Heart disease Father    Kidney failure Father    Breast cancer Mother    Hypertension Mother    Coronary  artery disease Mother    Alzheimer's disease Mother    Hypertension Brother    Hypertension Sister    Hypertension Daughter     Current Outpatient Medications  Medication Sig Dispense Refill   acetaminophen  (TYLENOL ) 500 MG tablet Take 1,000-1,500 mg by mouth daily as needed for headache (pain).     albuterol  (VENTOLIN  HFA) 108 (90 Base) MCG/ACT inhaler Inhale 2 puffs into the lungs every 6 (six) hours as needed for wheezing or shortness of breath. 18 g 2   alendronate (FOSAMAX) 70 MG tablet Take 70 mg by mouth every Thursday. Take with a full glass of water  on an empty stomach.     aspirin  EC 81 MG tablet Take 81 mg by mouth 2 (two) times daily. Swallow whole.     cyclobenzaprine (FLEXERIL) 10 MG tablet Take 10 mg by mouth at bedtime as needed.     diltiazem  (CARDIZEM  CD) 120 MG 24 hr capsule TAKE 1 CAPSULE (120 MG TOTAL) BY MOUTH DAILY. PLEASE SCHEDULE APPT FOR FUTURE REFILLS. 1ST ATTEMPT (Patient taking differently: Take 120 mg by mouth every morning. Please schedule appt for future refills. 1st attempt) 90 capsule 3   DULoxetine  (CYMBALTA ) 60 MG capsule Take 60 mg by mouth at bedtime.     ferrous sulfate  325 (65 FE) MG EC tablet Take 1 tablet (325 mg total) by mouth 2 (two) times daily with a meal.     hydrALAZINE  (APRESOLINE ) 25 MG tablet Take 25 mg by mouth 2 (two) times daily.     hydrOXYzine  (ATARAX ) 50 MG tablet Take 25-50 mg by mouth 2 (two) times daily as needed.     ipratropium-albuterol  (DUONEB) 0.5-2.5 (3) MG/3ML SOLN Take 3 mLs by nebulization every 6 (six) hours as needed. 360  mL 0   KLOR-CON  M10 10 MEQ tablet Take 10 mEq by mouth daily.     MAGNESIUM  PO Take 1 tablet by mouth in the morning.     nitroGLYCERIN  (NITROSTAT ) 0.4 MG SL tablet Place 0.4 mg under the tongue.     pantoprazole  (PROTONIX ) 40 MG tablet Take 1 tablet (40 mg total) by mouth 2 (two) times daily. 60 tablet 0   Red Yeast Rice Extract 600 MG CAPS Take 2 capsules by mouth daily at 6 (six) AM.     No current  facility-administered medications for this visit.    Allergies  Allergen Reactions   Midazolam  Hcl Other (See Comments)    HYPER   Prednisone  Other (See Comments)    HYPER, depressed, moody   Statins Other (See Comments)    Causes muscle weakness and joint pain   Doxycycline  Nausea Only   Ace Inhibitors Other (See Comments)    DECREASES HEARTRATE   Amoxicillin  Diarrhea    Did it involve swelling of the face/tongue/throat, SOB, or low BP? No Did it involve sudden or severe rash/hives, skin peeling, or any reaction on the inside of your mouth or nose? No Did you need to seek medical attention at a hospital or doctor's office? No When did it last happen?  ~2019 or so If all above answers are "NO", may proceed with cephalosporin use.    Nsaids Other (See Comments)    AVOIDS; HX GASTRIC ULCER   Sulfa Antibiotics Rash and Other (See Comments)    JOINT PAIN     REVIEW OF SYSTEMS:   [X]  denotes positive finding, [ ]  denotes negative finding Cardiac  Comments:  Chest pain or chest pressure:    Shortness of breath upon exertion:    Short of breath when lying flat:    Irregular heart rhythm:        Vascular    Pain in calf, thigh, or hip brought on by ambulation:    Pain in feet at night that wakes you up from your sleep:     Blood clot in your veins:    Leg swelling:         Pulmonary    Oxygen at home:    Productive cough:     Wheezing:         Neurologic    Sudden weakness in arms or legs:     Sudden numbness in arms or legs:     Sudden onset of difficulty speaking or slurred speech:    Temporary loss of vision in one eye:     Problems with dizziness:         Gastrointestinal    Blood in stool:     Vomited blood:         Genitourinary    Burning when urinating:     Blood in urine:        Psychiatric    Major depression:         Hematologic    Bleeding problems:    Problems with blood clotting too easily:        Skin    Rashes or ulcers:         Constitutional    Fever or chills:      PHYSICAL EXAMINATION:  Vitals:   06/17/24 1113  BP: (!) 189/71  Pulse: 61  Resp: 18  Temp: (!) 97 F (36.1 C)  TempSrc: Temporal  SpO2: 93%  Weight: 165 lb 6.4 oz (75 kg)  Height:  4' 10 (1.473 m)    General:  WDWN in NAD; vital signs documented above Gait: Normal HENT: WNL, normocephalic Pulmonary: normal non-labored breathing Cardiac: regular HR; right carotid bruit Abdomen: soft Vascular Exam/Pulses: 2+ radial, 2+ femoral, 2+ DP pulses bilaterally Extremities: without ischemic changes, without Gangrene , without cellulitis; without open wounds;  Musculoskeletal: no muscle wasting or atrophy  Neurologic: A&O X 3 Psychiatric:  The pt has Normal affect.   Non-Invasive Vascular Imaging:   VAS US  Carotid duplex: Summary:  Right Carotid: Velocities in the right ICA are consistent with a 1-39% stenosis.   Left Carotid: Velocities in the left ICA are consistent with a 40-59% stenosis. Calcific plaque noted at the bifurcation, and origin of the ICA and ECA, may obscure higher velocity. The ECA appears >50% stenosed.   Vertebrals: Bilateral vertebral arteries demonstrate antegrade flow.   ASSESSMENT/PLAN:: 68 y.o. female here for routine follow up of carotid stenosis. She had carotid bruit heard on examination prior to undergoing back surgery in 2022 and was sent to us  for evaluation. She has had 1-39% ICA stenosis bilaterally on duplex examination. She has no history of TIA or stroke. She remains without any associated symptoms. Her duplex today overall is stable. Slightly increased velocities on the left suggesting 40-59% stenosis. Normal flow in the vertebral and subclavian arteries.  - Continue Aspirin  - she will follow up again in 1 year with repeat carotid duplex   Teretha Damme, PA-C [facility] [phone]  Clinic MD:   [facility]

## 2024-06-17 ENCOUNTER — Ambulatory Visit: Admitting: Physician Assistant

## 2024-06-17 ENCOUNTER — Ambulatory Visit (HOSPITAL_COMMUNITY)
Admission: RE | Admit: 2024-06-17 | Discharge: 2024-06-17 | Disposition: A | Discharge status: Home / Self Care | Source: Ambulatory Visit | Attending: Vascular Surgery | Admitting: Vascular Surgery

## 2024-06-17 VITALS — BP 189/71 | HR 61 | Temp 97.0°F | Resp 18 | Ht <= 58 in | Wt 165.4 lb

## 2024-06-17 DIAGNOSIS — I6529 Occlusion and stenosis of unspecified carotid artery: Secondary | ICD-10-CM | POA: Insufficient documentation

## 2024-07-16 DIAGNOSIS — Z1231 Encounter for screening mammogram for malignant neoplasm of breast: Secondary | ICD-10-CM | POA: Diagnosis not present

## 2024-07-16 DIAGNOSIS — M8588 Other specified disorders of bone density and structure, other site: Secondary | ICD-10-CM | POA: Diagnosis not present

## 2024-07-16 DIAGNOSIS — N958 Other specified menopausal and perimenopausal disorders: Secondary | ICD-10-CM | POA: Diagnosis not present

## 2024-07-16 DIAGNOSIS — E2839 Other primary ovarian failure: Secondary | ICD-10-CM | POA: Diagnosis not present

## 2024-07-16 DIAGNOSIS — Z8262 Family history of osteoporosis: Secondary | ICD-10-CM | POA: Diagnosis not present

## 2024-07-17 DIAGNOSIS — I10 Essential (primary) hypertension: Secondary | ICD-10-CM | POA: Diagnosis not present

## 2024-07-17 DIAGNOSIS — G479 Sleep disorder, unspecified: Secondary | ICD-10-CM | POA: Diagnosis not present

## 2024-07-17 DIAGNOSIS — J449 Chronic obstructive pulmonary disease, unspecified: Secondary | ICD-10-CM | POA: Diagnosis not present

## 2024-07-17 DIAGNOSIS — J441 Chronic obstructive pulmonary disease with (acute) exacerbation: Secondary | ICD-10-CM | POA: Diagnosis not present

## 2024-07-17 DIAGNOSIS — E782 Mixed hyperlipidemia: Secondary | ICD-10-CM | POA: Diagnosis not present

## 2024-07-17 DIAGNOSIS — G4733 Obstructive sleep apnea (adult) (pediatric): Secondary | ICD-10-CM | POA: Diagnosis not present

## 2024-07-17 DIAGNOSIS — E1165 Type 2 diabetes mellitus with hyperglycemia: Secondary | ICD-10-CM | POA: Diagnosis not present

## 2024-08-06 DIAGNOSIS — R072 Precordial pain: Secondary | ICD-10-CM | POA: Diagnosis not present

## 2024-08-06 DIAGNOSIS — I10 Essential (primary) hypertension: Secondary | ICD-10-CM | POA: Diagnosis not present

## 2024-08-06 DIAGNOSIS — I425 Other restrictive cardiomyopathy: Secondary | ICD-10-CM | POA: Diagnosis not present

## 2024-08-06 DIAGNOSIS — J4520 Mild intermittent asthma, uncomplicated: Secondary | ICD-10-CM | POA: Diagnosis not present

## 2024-08-13 ENCOUNTER — Inpatient Hospital Stay
Admission: RE | Admit: 2024-08-13 | Discharge: 2024-08-13 | Disposition: A | Discharge status: Home / Self Care | Source: Ambulatory Visit | Attending: Acute Care | Admitting: Acute Care

## 2024-08-13 DIAGNOSIS — Z87891 Personal history of nicotine dependence: Secondary | ICD-10-CM | POA: Diagnosis not present

## 2024-08-13 DIAGNOSIS — Z122 Encounter for screening for malignant neoplasm of respiratory organs: Secondary | ICD-10-CM

## 2024-08-17 DIAGNOSIS — I10 Essential (primary) hypertension: Secondary | ICD-10-CM | POA: Diagnosis not present

## 2024-08-17 DIAGNOSIS — E782 Mixed hyperlipidemia: Secondary | ICD-10-CM | POA: Diagnosis not present

## 2024-08-17 DIAGNOSIS — J441 Chronic obstructive pulmonary disease with (acute) exacerbation: Secondary | ICD-10-CM | POA: Diagnosis not present

## 2024-08-17 DIAGNOSIS — J449 Chronic obstructive pulmonary disease, unspecified: Secondary | ICD-10-CM | POA: Diagnosis not present

## 2024-08-22 ENCOUNTER — Other Ambulatory Visit: Payer: Self-pay

## 2024-08-22 DIAGNOSIS — F17210 Nicotine dependence, cigarettes, uncomplicated: Secondary | ICD-10-CM

## 2024-08-22 DIAGNOSIS — Z122 Encounter for screening for malignant neoplasm of respiratory organs: Secondary | ICD-10-CM

## 2024-08-22 DIAGNOSIS — Z87891 Personal history of nicotine dependence: Secondary | ICD-10-CM

## 2024-09-16 DIAGNOSIS — I10 Essential (primary) hypertension: Secondary | ICD-10-CM | POA: Diagnosis not present

## 2024-09-16 DIAGNOSIS — J449 Chronic obstructive pulmonary disease, unspecified: Secondary | ICD-10-CM | POA: Diagnosis not present

## 2024-09-16 DIAGNOSIS — J441 Chronic obstructive pulmonary disease with (acute) exacerbation: Secondary | ICD-10-CM | POA: Diagnosis not present

## 2024-09-16 DIAGNOSIS — E782 Mixed hyperlipidemia: Secondary | ICD-10-CM | POA: Diagnosis not present

## 2024-09-25 ENCOUNTER — Inpatient Hospital Stay: Attending: Oncology

## 2024-09-25 ENCOUNTER — Inpatient Hospital Stay

## 2024-09-25 ENCOUNTER — Inpatient Hospital Stay: Admitting: Oncology

## 2024-09-25 VITALS — BP 117/58 | HR 76 | Temp 98.2°F | Resp 18 | Wt 173.5 lb

## 2024-09-25 DIAGNOSIS — D649 Anemia, unspecified: Secondary | ICD-10-CM | POA: Diagnosis not present

## 2024-09-25 DIAGNOSIS — Z23 Encounter for immunization: Secondary | ICD-10-CM | POA: Insufficient documentation

## 2024-09-25 DIAGNOSIS — I10 Essential (primary) hypertension: Secondary | ICD-10-CM | POA: Diagnosis not present

## 2024-09-25 DIAGNOSIS — J449 Chronic obstructive pulmonary disease, unspecified: Secondary | ICD-10-CM | POA: Insufficient documentation

## 2024-09-25 DIAGNOSIS — K760 Fatty (change of) liver, not elsewhere classified: Secondary | ICD-10-CM | POA: Insufficient documentation

## 2024-09-25 DIAGNOSIS — I2510 Atherosclerotic heart disease of native coronary artery without angina pectoris: Secondary | ICD-10-CM | POA: Insufficient documentation

## 2024-09-25 DIAGNOSIS — D72829 Elevated white blood cell count, unspecified: Secondary | ICD-10-CM

## 2024-09-25 DIAGNOSIS — D1803 Hemangioma of intra-abdominal structures: Secondary | ICD-10-CM | POA: Insufficient documentation

## 2024-09-25 DIAGNOSIS — Z79899 Other long term (current) drug therapy: Secondary | ICD-10-CM | POA: Diagnosis not present

## 2024-09-25 DIAGNOSIS — Z87891 Personal history of nicotine dependence: Secondary | ICD-10-CM | POA: Insufficient documentation

## 2024-09-25 LAB — CBC WITH DIFFERENTIAL (CANCER CENTER ONLY)
Abs Immature Granulocytes: 0.05 K/uL (ref 0.00–0.07)
Basophils Absolute: 0.1 K/uL (ref 0.0–0.1)
Basophils Relative: 1 %
Eosinophils Absolute: 0.4 K/uL (ref 0.0–0.5)
Eosinophils Relative: 2 %
HCT: 41.7 % (ref 36.0–46.0)
Hemoglobin: 12.9 g/dL (ref 12.0–15.0)
Immature Granulocytes: 0 %
Lymphocytes Relative: 17 %
Lymphs Abs: 2.8 K/uL (ref 0.7–4.0)
MCH: 27.4 pg (ref 26.0–34.0)
MCHC: 30.9 g/dL (ref 30.0–36.0)
MCV: 88.5 fL (ref 80.0–100.0)
Monocytes Absolute: 1.1 K/uL — ABNORMAL HIGH (ref 0.1–1.0)
Monocytes Relative: 7 %
Neutro Abs: 11.9 K/uL — ABNORMAL HIGH (ref 1.7–7.7)
Neutrophils Relative %: 73 %
Platelet Count: 263 K/uL (ref 150–400)
RBC: 4.71 MIL/uL (ref 3.87–5.11)
RDW: 13.2 % (ref 11.5–15.5)
WBC Count: 16.4 K/uL — ABNORMAL HIGH (ref 4.0–10.5)
nRBC: 0 % (ref 0.0–0.2)

## 2024-09-25 LAB — FERRITIN: Ferritin: 48 ng/mL (ref 11–307)

## 2024-09-25 MED ORDER — INFLUENZA VAC SPLIT HIGH-DOSE 0.5 ML IM SUSY
0.5000 mL | PREFILLED_SYRINGE | Freq: Once | INTRAMUSCULAR | Status: AC
  Administered 2024-09-25: 0.5 mL via INTRAMUSCULAR
  Filled 2024-09-25: qty 0.5

## 2024-09-25 NOTE — Progress Notes (Signed)
  [facility] OFFICE PROGRESS NOTE   Diagnosis: Leukocytosis history of iron deficiency  INTERVAL HISTORY:   Ms. Marquess returns as scheduled.  No recent infection.  She generally feels well.  She reports chronic dyspnea secondary to COPD.  She is not smoking.  She underwent a colonoscopy in June (we do not have the report available today).  Objective:  Vital signs in last 24 hours:  Last menstrual period 09/06/2005.    Lymphatics: No cervical, supraclavicular, axillary, or inguinal nodes Resp: End inspiratory wheeze at the left posterior base, no respiratory distress Cardio: Regular rate and rhythm GI: No hepatosplenomegaly Vascular: No leg edema  Lab Results:  Lab Results  Component Value Date   WBC 16.4 (H) 09/25/2024   HGB 12.9 09/25/2024   HCT 41.7 09/25/2024   MCV 88.5 09/25/2024   PLT 263 09/25/2024   NEUTROABS 11.9 (H) 09/25/2024    CMP  Lab Results  Component Value Date   NA 137 09/21/2023   K 4.5 09/21/2023   CL 100 09/21/2023   CO2 29 09/21/2023   GLUCOSE 121 (H) 09/21/2023   BUN 24 (H) 09/21/2023   CREATININE 1.01 (H) 09/21/2023   CALCIUM  9.6 09/21/2023   PROT 7.7 09/21/2023   ALBUMIN  4.3 09/21/2023   AST 45 (H) 09/21/2023   ALT 40 09/21/2023   ALKPHOS 106 09/21/2023   BILITOT 0.5 09/21/2023   GFRNONAA >60 09/21/2023   GFRAA >60 07/21/2020    No results found for: CEA1, CEA, CAN199, CA125  Lab Results  Component Value Date   INR 0.9 01/19/2021   LABPROT 11.9 01/19/2021    Imaging:  No results found.  Medications: I have reviewed the patient's current medications.   Assessment/Plan: Leukocytosis 05/27/2015-BCR/ABL not detected, negative for JAK2 V617F mutation 09/15/2015 bone marrow biopsy-normocellular bone marrow with trilineage hematopoiesis, no definite morphologic evidence of a myeloproliferative or myelodysplastic process, cytogenetic analysis unremarkable 05/01/2022 advanced NGS JAK2, MPL and CALR  normal Hepatomegaly on exam 05/01/2022 05/03/2022 CT abdomen/pelvis-no acute findings.  Stable mild hepatomegaly and diffuse hepatic steatosis.  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  The leukocytosis may be related to inflammation from COPD.  She is stable from a hematologic standpoint.  She has a history of iron deficiency.  Stool Hemoccults were positive in December 2024 and negative in April 2025.  She reports undergoing a colonoscopy in June 2025.  She is followed by [facility].  I recommended she remain up-to-date on influenza and pneumonia vaccines.  She does not wish to receive a COVID vaccine.  She will return for an office and lab visit in 1 year.  I am available to see her in the interim as needed.  Arley Hof, MD  09/25/2024  12:05 PM

## 2024-10-07 DIAGNOSIS — E1165 Type 2 diabetes mellitus with hyperglycemia: Secondary | ICD-10-CM | POA: Diagnosis not present

## 2024-10-07 DIAGNOSIS — I10 Essential (primary) hypertension: Secondary | ICD-10-CM | POA: Diagnosis not present

## 2024-10-07 DIAGNOSIS — G629 Polyneuropathy, unspecified: Secondary | ICD-10-CM | POA: Diagnosis not present

## 2024-10-17 DIAGNOSIS — I10 Essential (primary) hypertension: Secondary | ICD-10-CM | POA: Diagnosis not present

## 2024-10-17 DIAGNOSIS — J441 Chronic obstructive pulmonary disease with (acute) exacerbation: Secondary | ICD-10-CM | POA: Diagnosis not present

## 2024-10-17 DIAGNOSIS — E782 Mixed hyperlipidemia: Secondary | ICD-10-CM | POA: Diagnosis not present

## 2024-10-17 DIAGNOSIS — J449 Chronic obstructive pulmonary disease, unspecified: Secondary | ICD-10-CM | POA: Diagnosis not present

## 2024-11-16 DIAGNOSIS — J441 Chronic obstructive pulmonary disease with (acute) exacerbation: Secondary | ICD-10-CM | POA: Diagnosis not present

## 2024-11-16 DIAGNOSIS — J449 Chronic obstructive pulmonary disease, unspecified: Secondary | ICD-10-CM | POA: Diagnosis not present

## 2024-11-16 DIAGNOSIS — I10 Essential (primary) hypertension: Secondary | ICD-10-CM | POA: Diagnosis not present

## 2024-11-16 DIAGNOSIS — E782 Mixed hyperlipidemia: Secondary | ICD-10-CM | POA: Diagnosis not present

## 2024-12-27 ENCOUNTER — Inpatient Hospital Stay: Admission: RE | Admit: 2024-12-27 | Discharge: 2024-12-27

## 2024-12-27 ENCOUNTER — Ambulatory Visit (INDEPENDENT_AMBULATORY_CARE_PROVIDER_SITE_OTHER)

## 2024-12-27 VITALS — BP 166/72 | HR 64 | Temp 98.3°F | Resp 16

## 2024-12-27 DIAGNOSIS — S7001XA Contusion of right hip, initial encounter: Secondary | ICD-10-CM

## 2024-12-27 DIAGNOSIS — M25551 Pain in right hip: Secondary | ICD-10-CM

## 2024-12-27 MED ORDER — ACETAMINOPHEN-CODEINE 300-30 MG PO TABS: 1.0000 | ORAL_TABLET | Freq: Four times a day (QID) | ORAL | 0 refills | Status: AC | PRN

## 2024-12-27 NOTE — ED Provider Notes (Signed)
 " [facility]    CSN: [id number] Arrival date & time: 12/27/24  1253      History   Chief Complaint Chief Complaint  Patient presents with   Fall    HPI [name] is a 69 y.o. female.   Pt presents today due to 3-4/10 right hip pain at rest and 6/10 right hip pain with movement for 2 weeks after slipping and falling at home. Pt states that she slipped on a towel she put on the floor to clean up urine for her dog. Pt states that landed on right hip. Pt states that she is taking Tylenol  with relief of pain.   The history is provided by the patient.  Fall    Past Medical History:  Diagnosis Date   Acute coronary syndrome (HCC)    Acute exacerbation of chronic obstructive airways disease (HCC)    Acute renal failure syndrome    Agoraphobia without history of panic disorder    Anginal pain    Anxiety    Arthritis    qwhere (04/03/2018)   Bilateral carpal tunnel syndrome 02/18/2016   Bilateral knee pain    Carpal tunnel syndrome of left wrist    Chest pain    Childhood asthma    Chronic bronchitis (HCC)    Chronic lower back pain    Colitis presumed infectious    COPD (chronic obstructive pulmonary disease) (HCC)    Coronary artery disease CARDIOLOGIST- DR  CLAUDENE  (VISIT 03-30-11 W/ CHART)   NON-OBS. CAD   (STRESS TEST NOV. 2011   Depression    DJD (degenerative joint disease)    JOINT PAIN   Dyspnea    anytime but mostly on exertion and smoking   Dysrhythmia 2017   SVT   Elevated liver enzymes    Fibromyalgia    Frequency of urination    GERD (gastroesophageal reflux disease)     W/ NEXIUM   Heart murmur    Hemorrhoids    High cholesterol    History of blood transfusion    related to anemia (04/03/2018)   History of carpal tunnel syndrome    Right   History of gastric ulcer 2004   Hyperlipidemia    Hypertension    IBS (irritable bowel syndrome)    Incisional pain s/p interstim implant 1st stage--- 12-07-11    left upper buttock--  pt states dressing clean dry and intact (on 12-08-11)   Influenza due to influenza A virus    Insomnia    Internal derangement of left knee    Iron deficiency anemia 2004   Lesion of liver    Leukocytosis    Lumbar pain    Nocturia    Non-productive cough    Numbness in both hands AT TIMES   OSA (obstructive sleep apnea)    suppose to wear mask; I don't (04/03/2018)   Osteoarthritis of left knee    PONV (postoperative nausea and vomiting)    Post laminectomy syndrome    Preinfarction syndrome (HCC)    Supraventricular tachycardia    SVT (supraventricular tachycardia)    Tobacco user    Urge urinary incontinence     Patient Active Problem List   Diagnosis Date Noted   Acute respiratory failure with hypoxia (HCC) 11/14/2021   Elevated LFTs    Influenza A 11/13/2021   S/P lumbar fusion 01/26/2021   Encounter for orthopedic follow-up care 10/25/2020   Pain in left knee 08/17/2020   Colitis presumed infectious 02/17/2020  Lesion of liver 02/17/2020   History of total knee arthroplasty 02/10/2020   Osteoarthritis of right knee 10/30/2019   Osteoarthritis of left knee 10/30/2019   Internal derangement of left knee 07/07/2019   Bilateral knee pain 03/21/2019   Low back pain 11/27/2018   Complication of surgical procedure 06/17/2018   Degeneration of lumbar intervertebral disc 06/17/2018   Degenerative spondylolisthesis 06/17/2018   Acute coronary syndrome (HCC) 04/02/2018   Hyperlipidemia 08/06/2017   Influenza due to influenza A virus 01/01/2017   COPD with acute exacerbation (HCC) 01/01/2017   Acute renal failure syndrome 01/01/2017   Dehydration 01/01/2017   Preinfarction syndrome (HCC) 10/14/2016   Chest pain 08/22/2016   Supraventricular tachycardia 08/22/2016   Obstructive sleep apnea syndrome    Hypertensive disorder    Gastroesophageal reflux disease    Depressive disorder    Coronary arteriosclerosis    Anxiety    Pain in the chest    Hypokalemia     Carpal tunnel syndrome of left wrist 02/18/2016   Leukocytosis 05/26/2014   Tobacco user 05/26/2014   Urge incontinence of urine 12/14/2011    Past Surgical History:  Procedure Laterality Date   ABDOMINAL EXPOSURE N/A 01/26/2021   Procedure: ABDOMINAL EXPOSURE;  Surgeon: Oris Krystal FALCON, MD;  Location: [facility] OR;  Service: Vascular;  Laterality: N/A;   ANTERIOR CERVICAL DECOMP/DISCECTOMY FUSION  2008   C4 - T1 (screws from 1st OR came out)   ANTERIOR CERVICAL DECOMP/DISCECTOMY FUSION  2002   C4 - 7   ANTERIOR LATERAL LUMBAR FUSION WITH PERCUTANEOUS SCREW 2 LEVEL Left 11/27/2018   Procedure: XLIF Lumbar 3-5, posterior spinal fusion L3-5;  Surgeon: Burnetta Aures, MD;  Location: [facility] OR;  Service: Orthopedics;  Laterality: Left;  5.5 hrs for entire procedure   ANTERIOR LUMBAR FUSION N/A 01/26/2021   Procedure: ANTERIOR LUMBAR FUSION LUMBAR FIVE-SACRAL ONE WITH POSTERIOR SPINAL FUSION INTERBODY (EXTENSION OF PREVIOUS FUSION);  Surgeon: Burnetta Aures, MD;  Location: [facility] OR;  Service: Orthopedics;  Laterality: N/A;  6hrs Dr. Oris to do approach Tap Block with exparel    APPENDECTOMY  1982   ARTHRODESIS METATARSALPHALANGEAL JOINT (MTPJ) Right 04/11/2023   Procedure: right 1st metatarsalphalangeal joint fusion;  Surgeon: Barton Drape, MD;  Location: [facility];  Service: Orthopedics;  Laterality: Right;   BACK SURGERY     CARDIAC CATHETERIZATION  2003   CARDIAC CATHETERIZATION N/A 08/23/2016   Procedure: Left Heart Cath and Coronary Angiography;  Surgeon: Salena Negri, MD;  Location: [facility];  Service: Cardiovascular;  Laterality: N/A;   CARPAL TUNNEL RELEASE Right    CATARACT EXTRACTION W/ INTRAOCULAR LENS  IMPLANT, BILATERAL  2016-2018   CHOLECYSTECTOMY OPEN  1982   COLONOSCOPY     CYSTO/ HOD/ BLADDER BX  2006   CYSTO/ HOD/ BLADDER BX/ FULGERATION  06-12-2011   EYE SURGERY     FINGER SURGERY Right 2001   replaced worn cartilage on thumb w/tendons   HYSTEROSCOPY  WITH D & C  2005   INTERSTIM IMPLANT PLACEMENT  12/07/2011   Procedure: RENNA IMPLANT FIRST STAGE;  Surgeon: Glendia DELENA Elizabeth, MD;  Location: [facility];  Service: Urology;  Laterality: Right;   INTERSTIM IMPLANT PLACEMENT  12/14/2011   Procedure: RENNA IMPLANT SECOND STAGE;  Surgeon: Glendia DELENA Elizabeth, MD;  Location: [facility];  Service: Urology;  Laterality: Right;  rad tech ok by vickie at [facility]   JOINT REPLACEMENT     LUMBAR LAMINECTOMY  2007   L3 -  5   TONSILLECTOMY AND ADENOIDECTOMY  1965   TOTAL KNEE ARTHROPLASTY Right 02/10/2020   Procedure: TOTAL KNEE ARTHROPLASTY;  Surgeon: Ernie Cough, MD;  Location: [facility];  Service: Orthopedics;  Laterality: Right;  70 mins   TOTAL KNEE ARTHROPLASTY Left 07/20/2020   Procedure: TOTAL KNEE ARTHROPLASTY;  Surgeon: Ernie Cough, MD;  Location: [facility];  Service: Orthopedics;  Laterality: Left;   TUBAL LIGATION     UPPER GI ENDOSCOPY     WRIST SURGERY Left    TFCC (tendon repair)    OB History   No obstetric history on file.      Home Medications    Prior to Admission medications  Medication Sig Start Date End Date Taking? Authorizing Provider  acetaminophen -codeine  (TYLENOL  #3) 300-30 MG tablet Take 1 tablet by mouth every 6 (six) hours as needed for moderate pain (pain score 4-6). 12/27/24  Yes Andra Corean BROCKS, PA-C  albuterol  (VENTOLIN  HFA) 108 (90 Base) MCG/ACT inhaler Inhale 2 puffs into the lungs every 6 (six) hours as needed for wheezing or shortness of breath. 11/18/21   Akula, Vijaya, MD  alendronate (FOSAMAX) 70 MG tablet Take 70 mg by mouth every Thursday. Take with a full glass of water  on an empty stomach.    [provider]  aspirin  EC 81 MG tablet Take 81 mg by mouth 2 (two) times daily. Swallow whole.    [provider]  cyclobenzaprine (FLEXERIL) 10 MG tablet Take 10 mg by mouth at bedtime as needed. 02/18/23   [provider]  diltiazem  (CARDIZEM  CD)  120 MG 24 hr capsule TAKE 1 CAPSULE (120 MG TOTAL) BY MOUTH DAILY. PLEASE SCHEDULE APPT FOR FUTURE REFILLS. 1ST ATTEMPT 05/31/21   Camnitz, Soyla Lunger, MD  DULoxetine  (CYMBALTA ) 60 MG capsule Take 60 mg by mouth at bedtime.    [provider]  ferrous sulfate  325 (65 FE) MG EC tablet Take 1 tablet (325 mg total) by mouth 2 (two) times daily with a meal. 09/25/23   Cloretta Arley NOVAK, MD  hydrALAZINE  (APRESOLINE ) 25 MG tablet Take 25 mg by mouth 2 (two) times daily. 03/12/24   [provider]  hydrOXYzine  (ATARAX ) 50 MG tablet Take 25-50 mg by mouth 2 (two) times daily as needed. 12/24/22   [provider]  ipratropium-albuterol  (DUONEB) 0.5-2.5 (3) MG/3ML SOLN Take 3 mLs by nebulization every 6 (six) hours as needed. 08/15/23   Jerral Meth, MD  KLOR-CON  M10 10 MEQ tablet Take 10 mEq by mouth daily. 09/01/22   [provider]  MAGNESIUM  PO Take 1 tablet by mouth in the morning. Patient taking differently: Take 1 tablet by mouth every evening.    [provider]  nitroGLYCERIN  (NITROSTAT ) 0.4 MG SL tablet Place 0.4 mg under the tongue. Patient not taking: Reported on 09/25/2024    [provider]  pantoprazole  (PROTONIX ) 40 MG tablet Take 1 tablet (40 mg total) by mouth 2 (two) times daily. 02/20/20   Segal, Jared E, DO  Red Yeast Rice Extract 600 MG CAPS Take 2 capsules by mouth daily at 6 (six) AM. Patient not taking: Reported on 09/25/2024 04/03/23   [provider]    Family History Family History  Problem Relation Age of Onset   Hypertension Father    Heart disease Father    Kidney failure Father    Breast cancer Mother    Hypertension Mother    Coronary artery disease Mother    Alzheimer's disease Mother    Hypertension Brother  Hypertension Sister    Hypertension Daughter     Social History Social History[1]   Allergies   Midazolam  hcl, Prednisone , Statins, Doxycycline , Ace inhibitors, Amoxicillin , Nsaids, and Sulfa  antibiotics   Review of Systems Review of Systems   Physical Exam Triage Vital Signs ED Triage Vitals  Encounter Vitals Group     BP 12/27/24 1321 (!) 166/72     Girls Systolic BP Percentile --      Girls Diastolic BP Percentile --      Boys Systolic BP Percentile --      Boys Diastolic BP Percentile --      Pulse Rate 12/27/24 1321 64     Resp 12/27/24 1321 16     Temp 12/27/24 1321 98.3 F (36.8 C)     Temp Source 12/27/24 1321 Oral     SpO2 12/27/24 1321 92 %     Weight --      Height --      Head Circumference --      Peak Flow --      Pain Score 12/27/24 1320 4     Pain Loc --      Pain Education --      Exclude from Growth Chart --    No data found.  Updated Vital Signs BP (!) 166/72 (BP Location: Left Arm)   Pulse 64   Temp 98.3 F (36.8 C) (Oral)   Resp 16   LMP 09/06/2005 Comment: tubal ligation  SpO2 92%   Visual Acuity Right Eye Distance:   Left Eye Distance:   Bilateral Distance:    Right Eye Near:   Left Eye Near:    Bilateral Near:     Physical Exam Vitals and nursing note reviewed.  Constitutional:      General: She is not in acute distress.    Appearance: Normal appearance. She is not ill-appearing, toxic-appearing or diaphoretic.  Eyes:     General: No scleral icterus. Cardiovascular:     Rate and Rhythm: Normal rate and regular rhythm.     Heart sounds: Normal heart sounds.  Pulmonary:     Effort: Pulmonary effort is normal. No respiratory distress.     Breath sounds: Normal breath sounds. No wheezing or rhonchi.  Musculoskeletal:     Comments: Tenderness to palpation of lateral and posterior right hip, internal and external rotation of hip maintained  Skin:    General: Skin is warm.  Neurological:     Mental Status: She is alert and oriented to person, place, and time.  Psychiatric:        Mood and Affect: Mood normal.        Behavior: Behavior normal.      UC Treatments / Results  Labs (all labs ordered are listed, but  only abnormal results are displayed) Labs Reviewed - No data to display  EKG   Radiology DG Hip Unilat With Pelvis 2-3 Views Right Result Date: 12/27/2024 CLINICAL DATA:  Right hip pain. EXAM: DG HIP (WITH OR WITHOUT PELVIS) 2-3V*R* COMPARISON:  None Available. FINDINGS: There is no evidence of acute hip fracture or dislocation. Mild degenerative changes are present at the hips bilaterally. Lumbar spinal fusion hardware is noted. A neurostimulator device projects over the right pelvis. There is a bony exostosis along the lateral aspect of the iliac bone on the left. IMPRESSION: 1. No acute fracture or dislocation. 2. Mild degenerative changes at the hips bilaterally. Electronically Signed   By: Leita Waddell HERO.D.  On: 12/27/2024 14:24    Procedures Procedures (including critical care time)  Medications Ordered in UC Medications - No data to display  Initial Impression / Assessment and Plan / UC Course  I have reviewed the triage vital signs and the nursing notes.  Pertinent labs & imaging results that were available during my care of the patient were reviewed by me and considered in my medical decision making (see chart for details).      Final Clinical Impressions(s) / UC Diagnoses   Final diagnoses:  Pain in right hip  Contusion of right hip, initial encounter     Discharge Instructions      No hip fracture noted on preliminary overread on xray. If radiologist sees something different we will call and let you know.   You may use ice on affected area for 20 mins at a time a couple times a day and I will send Tylenol  #3 to your pharmacy for you to use as needed for pain.      ED Prescriptions     Medication Sig Dispense Auth. Provider   acetaminophen -codeine  (TYLENOL  #3) 300-30 MG tablet Take 1 tablet by mouth every 6 (six) hours as needed for moderate pain (pain score 4-6). 15 tablet Andra Corean BROCKS, PA-C      I have reviewed the PDMP during this encounter.     [1]  Social History Tobacco Use   Smoking status: Former    Current packs/day: 0.00    Average packs/day: 2.0 packs/day for 43.0 years (86.0 ttl pk-yrs)    Types: Cigarettes    Start date: 11/13/1978    Quit date: 11/13/2021    Years since quitting: 3.1   Smokeless tobacco: Never  Vaping Use   Vaping status: Never Used  Substance Use Topics   Alcohol  use: Not Currently    Alcohol /week: 0.0 standard drinks of alcohol     Comment: 04/03/2018 nothing in years   Drug use: Not Currently    Types: Marijuana     Andra Corean BROCKS, PA-C 12/27/24 1427  "

## 2024-12-27 NOTE — ED Triage Notes (Signed)
 Pt present fall two weeks, pt states having right hip and pelvis pain.  Pt state the certain ways she moves hurts the area.

## 2024-12-27 NOTE — Discharge Instructions (Addendum)
 No hip fracture noted on preliminary overread on xray. If radiologist sees something different we will call and let you know.   You may use ice on affected area for 20 mins at a time a couple times a day and I will send Tylenol  #3 to your pharmacy for you to use as needed for pain.
